# Patient Record
Sex: Female | Born: 1950 | Race: Black or African American | Hispanic: No | Marital: Married | Smoking: Former smoker
Health system: Southern US, Community
[De-identification: ages and names within clinical notes are randomized; demographics above are authoritative.]

## PROBLEM LIST (undated history)

## (undated) DIAGNOSIS — E785 Hyperlipidemia, unspecified: Secondary | ICD-10-CM

## (undated) DIAGNOSIS — I2510 Atherosclerotic heart disease of native coronary artery without angina pectoris: Secondary | ICD-10-CM

## (undated) DIAGNOSIS — K219 Gastro-esophageal reflux disease without esophagitis: Secondary | ICD-10-CM

## (undated) DIAGNOSIS — J189 Pneumonia, unspecified organism: Secondary | ICD-10-CM

## (undated) DIAGNOSIS — Z95810 Presence of automatic (implantable) cardiac defibrillator: Secondary | ICD-10-CM

## (undated) DIAGNOSIS — I214 Non-ST elevation (NSTEMI) myocardial infarction: Secondary | ICD-10-CM

## (undated) DIAGNOSIS — N289 Disorder of kidney and ureter, unspecified: Secondary | ICD-10-CM

## (undated) DIAGNOSIS — G8929 Other chronic pain: Secondary | ICD-10-CM

## (undated) DIAGNOSIS — G43909 Migraine, unspecified, not intractable, without status migrainosus: Secondary | ICD-10-CM

## (undated) DIAGNOSIS — I509 Heart failure, unspecified: Secondary | ICD-10-CM

## (undated) DIAGNOSIS — F329 Major depressive disorder, single episode, unspecified: Secondary | ICD-10-CM

## (undated) DIAGNOSIS — F419 Anxiety disorder, unspecified: Secondary | ICD-10-CM

## (undated) DIAGNOSIS — I493 Ventricular premature depolarization: Secondary | ICD-10-CM

## (undated) DIAGNOSIS — M1990 Unspecified osteoarthritis, unspecified site: Secondary | ICD-10-CM

## (undated) DIAGNOSIS — F319 Bipolar disorder, unspecified: Secondary | ICD-10-CM

## (undated) DIAGNOSIS — J45909 Unspecified asthma, uncomplicated: Secondary | ICD-10-CM

## (undated) DIAGNOSIS — D509 Iron deficiency anemia, unspecified: Secondary | ICD-10-CM

## (undated) DIAGNOSIS — I270 Primary pulmonary hypertension: Secondary | ICD-10-CM

## (undated) DIAGNOSIS — E119 Type 2 diabetes mellitus without complications: Secondary | ICD-10-CM

## (undated) DIAGNOSIS — M549 Dorsalgia, unspecified: Secondary | ICD-10-CM

## (undated) DIAGNOSIS — F32A Depression, unspecified: Secondary | ICD-10-CM

## (undated) DIAGNOSIS — I10 Essential (primary) hypertension: Secondary | ICD-10-CM

## (undated) DIAGNOSIS — I272 Pulmonary hypertension, unspecified: Secondary | ICD-10-CM

## (undated) DIAGNOSIS — I5022 Chronic systolic (congestive) heart failure: Secondary | ICD-10-CM

## (undated) DIAGNOSIS — I255 Ischemic cardiomyopathy: Secondary | ICD-10-CM

## (undated) DIAGNOSIS — R011 Cardiac murmur, unspecified: Secondary | ICD-10-CM

## (undated) DIAGNOSIS — N183 Chronic kidney disease, stage 3 unspecified: Secondary | ICD-10-CM

## (undated) DIAGNOSIS — I959 Hypotension, unspecified: Secondary | ICD-10-CM

## (undated) DIAGNOSIS — D696 Thrombocytopenia, unspecified: Secondary | ICD-10-CM

## (undated) DIAGNOSIS — I639 Cerebral infarction, unspecified: Secondary | ICD-10-CM

## (undated) DIAGNOSIS — E669 Obesity, unspecified: Secondary | ICD-10-CM

## (undated) DIAGNOSIS — I209 Angina pectoris, unspecified: Secondary | ICD-10-CM

## (undated) HISTORY — PX: MASS EXCISION: SHX2000

## (undated) HISTORY — DX: Migraine, unspecified, not intractable, without status migrainosus: G43.909

## (undated) HISTORY — DX: Atherosclerotic heart disease of native coronary artery without angina pectoris: I25.10

## (undated) HISTORY — DX: Obesity, unspecified: E66.9

## (undated) HISTORY — DX: Unspecified osteoarthritis, unspecified site: M19.90

## (undated) HISTORY — DX: Major depressive disorder, single episode, unspecified: F32.9

## (undated) HISTORY — DX: Pulmonary hypertension, unspecified: I27.20

## (undated) HISTORY — DX: Chronic systolic (congestive) heart failure: I50.22

## (undated) HISTORY — PX: IMPLANTABLE CARDIOVERTER DEFIBRILLATOR IMPLANT: SHX5860

## (undated) HISTORY — DX: Cerebral infarction, unspecified: I63.9

## (undated) HISTORY — DX: Hyperlipidemia, unspecified: E78.5

## (undated) HISTORY — DX: Ventricular premature depolarization: I49.3

## (undated) HISTORY — DX: Gastro-esophageal reflux disease without esophagitis: K21.9

## (undated) HISTORY — DX: Thrombocytopenia, unspecified: D69.6

## (undated) HISTORY — PX: TONSILLECTOMY: SUR1361

## (undated) HISTORY — DX: Depression, unspecified: F32.A

## (undated) HISTORY — DX: Essential (primary) hypertension: I10

---
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
## 1979-09-01 HISTORY — PX: RIGHT OOPHORECTOMY: SHX2359

## 1992-01-01 HISTORY — PX: OSTECTOMY METATARSAL: SUR970

## 1999-08-29 ENCOUNTER — Encounter: Admission: RE | Admit: 1999-08-29 | Discharge: 1999-11-27 | Payer: Self-pay | Admitting: Internal Medicine

## 1999-09-01 HISTORY — PX: CORONARY ANGIOPLASTY WITH STENT PLACEMENT: SHX49

## 2000-01-31 ENCOUNTER — Encounter: Admission: RE | Admit: 2000-01-31 | Discharge: 2000-04-30 | Payer: Self-pay | Admitting: Internal Medicine

## 2000-05-09 ENCOUNTER — Ambulatory Visit (HOSPITAL_COMMUNITY): Admission: RE | Admit: 2000-05-09 | Discharge: 2000-05-09 | Payer: Self-pay | Admitting: *Deleted

## 2001-02-28 ENCOUNTER — Emergency Department (HOSPITAL_COMMUNITY): Admission: EM | Admit: 2001-02-28 | Discharge: 2001-02-28 | Payer: Self-pay | Admitting: Emergency Medicine

## 2001-03-20 ENCOUNTER — Ambulatory Visit (HOSPITAL_COMMUNITY): Admission: RE | Admit: 2001-03-20 | Discharge: 2001-03-20 | Payer: Self-pay | Admitting: Family Medicine

## 2001-03-20 ENCOUNTER — Encounter: Payer: Self-pay | Admitting: Family Medicine

## 2001-05-12 ENCOUNTER — Emergency Department (HOSPITAL_COMMUNITY): Admission: EM | Admit: 2001-05-12 | Discharge: 2001-05-13 | Payer: Self-pay | Admitting: Emergency Medicine

## 2001-09-04 ENCOUNTER — Encounter: Payer: Self-pay | Admitting: Internal Medicine

## 2001-09-05 ENCOUNTER — Encounter: Payer: Self-pay | Admitting: Family Medicine

## 2001-09-05 ENCOUNTER — Observation Stay (HOSPITAL_COMMUNITY): Admission: EM | Admit: 2001-09-05 | Discharge: 2001-09-06 | Payer: Self-pay | Admitting: *Deleted

## 2002-03-12 ENCOUNTER — Emergency Department (HOSPITAL_COMMUNITY): Admission: EM | Admit: 2002-03-12 | Discharge: 2002-03-12 | Payer: Self-pay | Admitting: *Deleted

## 2002-06-22 ENCOUNTER — Ambulatory Visit (HOSPITAL_COMMUNITY): Admission: RE | Admit: 2002-06-22 | Discharge: 2002-06-22 | Payer: Self-pay | Admitting: Family Medicine

## 2002-06-22 ENCOUNTER — Encounter: Payer: Self-pay | Admitting: Family Medicine

## 2002-09-28 ENCOUNTER — Encounter: Admission: RE | Admit: 2002-09-28 | Discharge: 2002-09-28 | Payer: Self-pay | Admitting: Family Medicine

## 2002-10-05 ENCOUNTER — Encounter: Admission: RE | Admit: 2002-10-05 | Discharge: 2002-10-05 | Payer: Self-pay | Admitting: Family Medicine

## 2002-10-05 ENCOUNTER — Encounter: Payer: Self-pay | Admitting: Family Medicine

## 2002-10-07 ENCOUNTER — Ambulatory Visit (HOSPITAL_COMMUNITY): Admission: RE | Admit: 2002-10-07 | Discharge: 2002-10-07 | Payer: Self-pay | Admitting: Family Medicine

## 2002-11-25 ENCOUNTER — Encounter: Payer: Self-pay | Admitting: Family Medicine

## 2002-11-25 ENCOUNTER — Ambulatory Visit (HOSPITAL_COMMUNITY): Admission: RE | Admit: 2002-11-25 | Discharge: 2002-11-25 | Payer: Self-pay | Admitting: Family Medicine

## 2002-12-07 ENCOUNTER — Ambulatory Visit (HOSPITAL_COMMUNITY): Admission: RE | Admit: 2002-12-07 | Discharge: 2002-12-07 | Payer: Self-pay | Admitting: *Deleted

## 2002-12-07 ENCOUNTER — Encounter: Payer: Self-pay | Admitting: *Deleted

## 2002-12-18 ENCOUNTER — Ambulatory Visit (HOSPITAL_COMMUNITY): Admission: RE | Admit: 2002-12-18 | Discharge: 2002-12-18 | Payer: Self-pay | Admitting: Gastroenterology

## 2003-07-07 ENCOUNTER — Ambulatory Visit (HOSPITAL_COMMUNITY): Admission: RE | Admit: 2003-07-07 | Discharge: 2003-07-07 | Payer: Self-pay | Admitting: Family Medicine

## 2003-07-07 ENCOUNTER — Encounter: Payer: Self-pay | Admitting: Family Medicine

## 2004-01-03 ENCOUNTER — Other Ambulatory Visit: Admission: RE | Admit: 2004-01-03 | Discharge: 2004-01-03 | Payer: Self-pay | Admitting: *Deleted

## 2004-07-11 ENCOUNTER — Emergency Department (HOSPITAL_COMMUNITY): Admission: EM | Admit: 2004-07-11 | Discharge: 2004-07-11 | Payer: Self-pay | Admitting: Emergency Medicine

## 2005-02-16 ENCOUNTER — Encounter: Admission: RE | Admit: 2005-02-16 | Discharge: 2005-02-16 | Payer: Self-pay | Admitting: Family Medicine

## 2006-12-11 ENCOUNTER — Ambulatory Visit: Payer: Self-pay | Admitting: Internal Medicine

## 2007-01-01 ENCOUNTER — Ambulatory Visit: Payer: Self-pay | Admitting: Internal Medicine

## 2007-01-20 ENCOUNTER — Ambulatory Visit (HOSPITAL_COMMUNITY): Admission: RE | Admit: 2007-01-20 | Discharge: 2007-01-20 | Payer: Self-pay | Admitting: Internal Medicine

## 2007-01-21 ENCOUNTER — Ambulatory Visit: Payer: Self-pay | Admitting: Internal Medicine

## 2007-09-15 ENCOUNTER — Inpatient Hospital Stay (HOSPITAL_BASED_OUTPATIENT_CLINIC_OR_DEPARTMENT_OTHER): Admission: RE | Admit: 2007-09-15 | Discharge: 2007-09-15 | Payer: Self-pay | Admitting: Cardiology

## 2007-10-08 ENCOUNTER — Ambulatory Visit: Payer: Self-pay | Admitting: Internal Medicine

## 2007-10-14 ENCOUNTER — Ambulatory Visit: Payer: Self-pay | Admitting: Internal Medicine

## 2007-10-14 LAB — CONVERTED CEMR LAB
BUN: 19 mg/dL (ref 6–23)
Basophils Absolute: 0 10*3/uL (ref 0.0–0.1)
Basophils Relative: 0.1 % (ref 0.0–1.0)
CO2: 31 meq/L (ref 19–32)
Calcium: 9.7 mg/dL (ref 8.4–10.5)
Chloride: 103 meq/L (ref 96–112)
Creatinine, Ser: 0.8 mg/dL (ref 0.4–1.2)
Eosinophils Absolute: 0.2 10*3/uL (ref 0.0–0.6)
Eosinophils Relative: 2.9 % (ref 0.0–5.0)
GFR calc Af Amer: 95 mL/min
GFR calc non Af Amer: 79 mL/min
Glucose, Bld: 194 mg/dL — ABNORMAL HIGH (ref 70–99)
HCT: 34.5 % — ABNORMAL LOW (ref 36.0–46.0)
Hemoglobin: 11.9 g/dL — ABNORMAL LOW (ref 12.0–15.0)
INR: 1 (ref 0.8–1.0)
Lymphocytes Relative: 40.4 % (ref 12.0–46.0)
MCHC: 34.6 g/dL (ref 30.0–36.0)
MCV: 73.7 fL — ABNORMAL LOW (ref 78.0–100.0)
Monocytes Absolute: 0.6 10*3/uL (ref 0.2–0.7)
Monocytes Relative: 8.7 % (ref 3.0–11.0)
Neutro Abs: 3.1 10*3/uL (ref 1.4–7.7)
Neutrophils Relative %: 47.9 % (ref 43.0–77.0)
Platelets: 198 10*3/uL (ref 150–400)
Potassium: 3.6 meq/L (ref 3.5–5.1)
Prothrombin Time: 12.1 s (ref 10.9–13.3)
RBC: 4.68 M/uL (ref 3.87–5.11)
RDW: 15.7 % — ABNORMAL HIGH (ref 11.5–14.6)
Sodium: 142 meq/L (ref 135–145)
WBC: 6.6 10*3/uL (ref 4.5–10.5)
aPTT: 28.9 s (ref 21.7–29.8)

## 2007-11-03 ENCOUNTER — Ambulatory Visit: Payer: Self-pay | Admitting: Internal Medicine

## 2007-11-03 ENCOUNTER — Ambulatory Visit (HOSPITAL_COMMUNITY): Admission: RE | Admit: 2007-11-03 | Discharge: 2007-11-04 | Payer: Self-pay | Admitting: Internal Medicine

## 2007-11-24 ENCOUNTER — Ambulatory Visit: Payer: Self-pay

## 2008-12-26 ENCOUNTER — Emergency Department (HOSPITAL_COMMUNITY): Admission: EM | Admit: 2008-12-26 | Discharge: 2008-12-26 | Payer: Self-pay | Admitting: Emergency Medicine

## 2009-02-10 ENCOUNTER — Encounter: Admission: RE | Admit: 2009-02-10 | Discharge: 2009-02-10 | Payer: Self-pay | Admitting: Family Medicine

## 2009-03-11 ENCOUNTER — Encounter: Admission: RE | Admit: 2009-03-11 | Discharge: 2009-03-11 | Payer: Self-pay | Admitting: Family Medicine

## 2009-03-21 ENCOUNTER — Encounter: Admission: RE | Admit: 2009-03-21 | Discharge: 2009-03-21 | Payer: Self-pay | Admitting: Family Medicine

## 2009-03-31 ENCOUNTER — Encounter: Admission: RE | Admit: 2009-03-31 | Discharge: 2009-03-31 | Payer: Self-pay | Admitting: Family Medicine

## 2009-04-18 ENCOUNTER — Encounter: Admission: RE | Admit: 2009-04-18 | Discharge: 2009-04-18 | Payer: Self-pay | Admitting: Neurological Surgery

## 2009-06-22 ENCOUNTER — Ambulatory Visit (HOSPITAL_COMMUNITY): Admission: RE | Admit: 2009-06-22 | Discharge: 2009-06-23 | Payer: Self-pay | Admitting: Neurological Surgery

## 2009-08-02 ENCOUNTER — Encounter: Admission: RE | Admit: 2009-08-02 | Discharge: 2009-08-02 | Payer: Self-pay | Admitting: Neurological Surgery

## 2009-10-03 ENCOUNTER — Encounter: Admission: RE | Admit: 2009-10-03 | Discharge: 2009-10-03 | Payer: Self-pay | Admitting: Neurological Surgery

## 2009-12-27 ENCOUNTER — Encounter: Admission: RE | Admit: 2009-12-27 | Discharge: 2009-12-27 | Payer: Self-pay | Admitting: Neurological Surgery

## 2009-12-31 HISTORY — PX: US ECHOCARDIOGRAPHY: HXRAD669

## 2010-02-28 DIAGNOSIS — I639 Cerebral infarction, unspecified: Secondary | ICD-10-CM

## 2010-02-28 DIAGNOSIS — Z8673 Personal history of transient ischemic attack (TIA), and cerebral infarction without residual deficits: Secondary | ICD-10-CM | POA: Insufficient documentation

## 2010-02-28 HISTORY — DX: Cerebral infarction, unspecified: I63.9

## 2010-03-03 ENCOUNTER — Ambulatory Visit: Payer: Self-pay | Admitting: Internal Medicine

## 2010-03-03 ENCOUNTER — Inpatient Hospital Stay (HOSPITAL_COMMUNITY): Admission: EM | Admit: 2010-03-03 | Discharge: 2010-03-09 | Payer: Self-pay | Admitting: Emergency Medicine

## 2010-03-06 ENCOUNTER — Encounter (INDEPENDENT_AMBULATORY_CARE_PROVIDER_SITE_OTHER): Payer: Self-pay | Admitting: Neurology

## 2010-03-08 ENCOUNTER — Ambulatory Visit: Payer: Self-pay | Admitting: Internal Medicine

## 2010-03-08 ENCOUNTER — Encounter (INDEPENDENT_AMBULATORY_CARE_PROVIDER_SITE_OTHER): Payer: Self-pay | Admitting: Neurology

## 2010-06-15 ENCOUNTER — Encounter: Admission: RE | Admit: 2010-06-15 | Discharge: 2010-06-15 | Payer: Self-pay | Admitting: *Deleted

## 2010-07-13 ENCOUNTER — Observation Stay (HOSPITAL_COMMUNITY): Admission: EM | Admit: 2010-07-13 | Discharge: 2010-07-15 | Payer: Self-pay | Admitting: Emergency Medicine

## 2010-08-14 ENCOUNTER — Ambulatory Visit: Payer: Self-pay | Admitting: Cardiology

## 2010-11-11 ENCOUNTER — Encounter: Payer: Self-pay | Admitting: Internal Medicine

## 2010-11-27 ENCOUNTER — Ambulatory Visit: Payer: Self-pay | Admitting: Internal Medicine

## 2010-12-13 ENCOUNTER — Inpatient Hospital Stay (HOSPITAL_COMMUNITY)
Admission: EM | Admit: 2010-12-13 | Discharge: 2010-12-16 | Payer: Self-pay | Source: Home / Self Care | Attending: Internal Medicine | Admitting: Internal Medicine

## 2011-01-15 ENCOUNTER — Encounter: Payer: Self-pay | Admitting: Cardiology

## 2011-01-30 ENCOUNTER — Ambulatory Visit: Payer: Self-pay | Admitting: Cardiology

## 2011-01-30 NOTE — Miscellaneous (Signed)
Summary: Device preload  Clinical Lists Changes  Observations: Added new observation of ICD INDICATN: CM (11/11/2010 9:21) Added new observation of ICDLEADSTAT1: active (11/11/2010 9:21) Added new observation of ICDLEADSER1: [id number] V (11/11/2010 9:21) Added new observation of ICDLEADMOD1: 6947  (11/11/2010 9:21) Added new observation of ICDLEADDOI1: 11/03/2007  (11/11/2010 9:21) Added new observation of ICDLEADLOC1: RV  (11/11/2010 9:21) Added new observation of ICD IMP MD: Andrea Bunting, MD  (11/11/2010 9:21) Added new observation of ICD IMPL DTE: 11/03/2007  (11/11/2010 9:21) Added new observation of ICD SERL#: [id number] H  (11/11/2010 9:21) Added new observation of ICD MODL#: 7232  (11/11/2010 9:21) Added new observation of ICDMANUFACTR: Medtronic  (11/11/2010 9:21) Added new observation of IDC REFER MD: Vonna Drafts  (11/11/2010 9:21) Added new observation of ICD MD: Andrea Bunting, MD  (11/11/2010 9:21)       ICD Specifications Following MD:  Andrea Bunting, MD     Referring MD:  Vonna Drafts ICD Vendor:  Medtronic     ICD Model Number:  7232     ICD Serial Number:  [id number] ICD DOI:  11/03/2007     ICD Implanting MD:  Andrea Bunting, MD  Lead 1:    Location: RV     DOI: 11/03/2007     Model #: 3557     Serial #: [id number]     Status: active  Indications::  CM

## 2011-01-30 NOTE — Procedures (Signed)
Summary: icd check/medtronic   Current Medications (verified): 1)  Pravastatin Sodium 40 Mg Tabs (Pravastatin Sodium) .... Take 1 Tablet By Mouth Once Daily 2)  Uloric 80 Mg Tabs (Febuxostat) .... Take As Directed Every Day 3)  Benicar 20 Mg Tabs (Olmesartan Medoxomil) .... Take 1 Tablet By Mouth Every Morning 4)  Januvia 100 Mg Tabs (Sitagliptin Phosphate) .... Take 1 Tablet By Mouth Every Morning 5)  Carvedilol 25 Mg Tabs (Carvedilol) .... Take 1 Tablet By Mouth Two Times A Day With Food 6)  Tramadol Hcl 50 Mg Tabs (Tramadol Hcl) .... Take 1-2 Tablets By Mouth Every 6 Hours As Needed For Pain 7)  Furosemide 40 Mg Tabs (Furosemide) .... Take 1 Tablet By Mouth Three Times A Day As Needed 8)  Dexilant 60 Mg Cpdr (Dexlansoprazole) .... Take 1 Capsule By Mouth Every Morning 9)  Cyclobenzaprine Hcl 10 Mg Tabs (Cyclobenzaprine Hcl) .... Take 1/2-1 Tablet By Mouth Every 8 Hours As Needed For Spasms 10)  Cymbalta 60 Mg Cpep (Duloxetine Hcl) .... Take 1 Capsule By Mouth Two Times A Day 11)  Tylenol Arthritis Pain 650 Mg Cr-Tabs (Acetaminophen) .... Take As Needed  Allergies (verified): 1)  ! Darvon 2)  ! Asa   ICD Specifications Following MD:  Andrea Bunting, MD     Referring MD:  Andrea [name] ICD Vendor:  Medtronic     ICD Model Number:  [phone]     ICD Serial Number:  [id number] ICD DOI:  11/03/2007     ICD Implanting MD:  Andrea Bunting, MD  Lead 1:    Location: RV     DOI: 11/03/2007     Model #: 7425     Serial #: [id number]     Status: active  Indications::  CM   ICD Follow Up Battery Voltage:  3.11 V     Charge Time:  8.43 seconds     Underlying rhythm:  SR   ICD Device Measurements Right Ventricle:  Amplitude: 15.6 mV, Impedance: 512 ohms, Threshold: 1.0 V at 0.2 msec Shock Impedance: 42/57 ohms   Episodes MS Episodes:  0     Shock:  0     ATP:  0     Nonsustained:  0     Atrial Therapies:  0 Ventricular Pacing:  0%  Brady Parameters Mode VVI     Lower Rate Limit:  40        Tachy Zones VF:  200-250     VT:  200-250     VT1:  182-200     Next Cardiology Appt Due:  03/01/2011 Tech Comments:  [facility] PT---NORMAL DEVICE FUNCTION.  NO EPISODES SINCE LAST CHECK.  NO CHANGES MADE. ROV IN 3 MTHS W/GT. PT NOT INTERESTED IN CARELINK--DOESNT HAVE LANDLINE PHONE. Andrea [name]  November 27, 2010 11:34 AM

## 2011-02-14 ENCOUNTER — Encounter (INDEPENDENT_AMBULATORY_CARE_PROVIDER_SITE_OTHER): Payer: BC Managed Care – PPO | Admitting: Internal Medicine

## 2011-02-14 ENCOUNTER — Encounter: Payer: Self-pay | Admitting: Internal Medicine

## 2011-02-14 DIAGNOSIS — I5022 Chronic systolic (congestive) heart failure: Secondary | ICD-10-CM

## 2011-02-14 DIAGNOSIS — I2589 Other forms of chronic ischemic heart disease: Secondary | ICD-10-CM

## 2011-02-14 DIAGNOSIS — I259 Chronic ischemic heart disease, unspecified: Secondary | ICD-10-CM | POA: Insufficient documentation

## 2011-02-14 DIAGNOSIS — Z95810 Presence of automatic (implantable) cardiac defibrillator: Secondary | ICD-10-CM | POA: Insufficient documentation

## 2011-02-21 NOTE — Assessment & Plan Note (Signed)
Summary: DEVICE/SAF RS FROM BUMPLIST/MB=MJ   Primary Provider:  Dr Fulton Mole   History of Present Illness: Andrea [name] returns today for followup. She is a pleasant 60 yo woman with a h/o DCM/ICM, CHF, DM, HTN who is s/p ICD implant several years ago. In the interim she has undergone stenting of the RCA. She denies c/p. She occaisionally gets sob and PND. She has not had any intercurrent ICD therapies. No peripheral edema. She denies dietary indiscretion.  Current Medications (verified): 1)  Pravastatin Sodium 40 Mg Tabs (Pravastatin Sodium) .... Take 1 Tablet By Mouth Once Daily 2)  Uloric 80 Mg Tabs (Febuxostat) .... Take As Directed Every Day 3)  Benicar 20 Mg Tabs (Olmesartan Medoxomil) .... Take 1 Tablet By Mouth Every Morning 4)  Januvia 100 Mg Tabs (Sitagliptin Phosphate) .... Take 1 Tablet By Mouth Every Morning 5)  Carvedilol 25 Mg Tabs (Carvedilol) .... Take 1 Tablet By Mouth Two Times A Day With Food 6)  Furosemide 40 Mg Tabs (Furosemide) .... Take 1 Tablet By Mouth 2-3 Times Daily 7)  Dexilant 60 Mg Cpdr (Dexlansoprazole) .... Take 1 Capsule By Mouth Every Morning 8)  Cymbalta 60 Mg Cpep (Duloxetine Hcl) .... Take 1 Capsule By Mouth Once Daily 9)  Tylenol Arthritis Pain 650 Mg Cr-Tabs (Acetaminophen) .... Take As Needed 10)  Lantus 100 Unit/ml Soln (Insulin Glargine) .... Once Daily 11)  Effient 10 Mg Tabs (Prasugrel Hcl) .... Take One Tablet By Mouth Daily 12)  Nitrostat 0.4 Mg Subl (Nitroglycerin) .Marland Kitchen.. 1 Tablet Under Tongue At Onset of Chest Pain; You May Repeat Every 5 Minutes For Up To 3 Doses.  Allergies (verified): 1)  ! Darvon 2)  ! Asa  Past History:  Past Medical History: CAD Class 2-3 CHF HTN DM  Past Surgical History: s/p ICD s/p PCI stenting RCA  Review of Systems       The patient complains of dyspnea on exertion.  The patient denies chest pain, syncope, and peripheral edema.         All systems reviewed and negative except as noted in the  HPI.  Vital Signs:  Patient profile:   60 year old female Height:      61 inches Weight:      200 pounds BMI:     37.93 Pulse rate:   111 / minute BP sitting:   108 / 74  (left arm) Cuff size:   large  Vitals Entered By: Hardin Negus, RMA (February 14, 2011 1:57 PM)  Physical Exam  General:  Well developed, well nourished, in no acute distress.  HEENT: normal Neck: supple. No JVD. Carotids 2+ bilaterally no bruits Cor: RRR no rubs, gallops or murmur except for a soft S4 gallop. Lungs: CTA. Well healed ICD incision. Ab: soft, nontender. nondistended. No HSM. Good bowel sounds Ext: warm. no cyanosis, clubbing or edema Neuro: alert and oriented. Grossly nonfocal. affect pleasant     ICD Specifications Following MD:  Lewayne Bunting, MD     Referring MD:  Vonna Drafts ICD Vendor:  Medtronic     ICD Model Number:  7232     ICD Serial Number:  [id number] ICD DOI:  11/03/2007     ICD Implanting MD:  Lewayne Bunting, MD  Lead 1:    Location: RV     DOI: 11/03/2007     Model #: 4098     Serial #: [id number]     Status: active  Indications::  CM   Brady Parameters Mode VVI  Lower Rate Limit:  40      Tachy Zones VF:  200-250     VT:  200-250     VT1:  182-200     MD Comments:  Normal Device function.  Impression & Recommendations:  Problem # 1:  AUTOMATIC IMPLANTABLE CARDIAC DEFIBRILLATOR SITU (ICD-V45.02) Her device is working normally. Will recheck in several months.  Problem # 2:  ISCHEMIC HEART DISEASE (ICD-414.9) She denies anginal symptoms since her stenting. Will follow. Her updated medication list for this problem includes:    Carvedilol 25 Mg Tabs (Carvedilol) .Marland Kitchen... Take 1 tablet by mouth two times a day with food    Effient 10 Mg Tabs (Prasugrel hcl) .Marland Kitchen... Take one tablet by mouth daily    Nitrostat 0.4 Mg Subl (Nitroglycerin) .Marland Kitchen... 1 tablet under tongue at onset of chest pain; you may repeat every 5 minutes for up to 3 doses.  Problem # 3:  CHRONIC SYSTOLIC  HEART FAILURE (ICD-428.22) She remains class 2. Will continue meds as below. Her updated medication list for this problem includes:    Benicar 20 Mg Tabs (Olmesartan medoxomil) .Marland Kitchen... Take 1 tablet by mouth every morning    Carvedilol 25 Mg Tabs (Carvedilol) .Marland Kitchen... Take 1 tablet by mouth two times a day with food    Furosemide 40 Mg Tabs (Furosemide) .Marland Kitchen... Take 1 tablet by mouth 2-3 times daily    Effient 10 Mg Tabs (Prasugrel hcl) .Marland Kitchen... Take one tablet by mouth daily    Nitrostat 0.4 Mg Subl (Nitroglycerin) .Marland Kitchen... 1 tablet under tongue at onset of chest pain; you may repeat every 5 minutes for up to 3 doses.  Patient Instructions: 1)  Your physician wants you to follow-up in:  3 months with device clinic You will receive a reminder letter in the mail two months in advance. If you don't receive a letter, please call our office to schedule the follow-up appointment. 2)  Your physician recommends that you continue on your current medications as directed. Please refer to the Current Medication list given to you today.

## 2011-02-26 ENCOUNTER — Ambulatory Visit
Admission: RE | Admit: 2011-02-26 | Discharge: 2011-02-26 | Disposition: A | Discharge status: Home / Self Care | Payer: BC Managed Care – PPO | Source: Ambulatory Visit | Attending: Neurological Surgery | Admitting: Neurological Surgery

## 2011-02-26 ENCOUNTER — Other Ambulatory Visit: Payer: Self-pay | Admitting: Neurological Surgery

## 2011-02-26 DIAGNOSIS — M542 Cervicalgia: Secondary | ICD-10-CM

## 2011-02-27 NOTE — Cardiovascular Report (Signed)
Summary: Office Visit   Office Visit   Imported By: Roderic Ovens 02/21/2011 15:43:57  _____________________________________________________________________  External Attachment:    Type:   Image     Comment:   External Document

## 2011-03-08 ENCOUNTER — Other Ambulatory Visit: Payer: Self-pay | Admitting: Neurological Surgery

## 2011-03-08 DIAGNOSIS — M542 Cervicalgia: Secondary | ICD-10-CM

## 2011-03-12 ENCOUNTER — Ambulatory Visit
Admission: RE | Admit: 2011-03-12 | Discharge: 2011-03-12 | Disposition: A | Discharge status: Home / Self Care | Payer: BC Managed Care – PPO | Source: Ambulatory Visit | Attending: Neurological Surgery | Admitting: Neurological Surgery

## 2011-03-12 DIAGNOSIS — M542 Cervicalgia: Secondary | ICD-10-CM

## 2011-03-12 LAB — LIPID PANEL
Cholesterol: 187 mg/dL (ref 0–200)
HDL: 37 mg/dL — ABNORMAL LOW (ref >39)
LDL Cholesterol: UNDETERMINED mg/dL (ref 0–99)
Total CHOL/HDL Ratio: 5.1 ratio
Triglycerides: 443 mg/dL — ABNORMAL HIGH (ref <150)
VLDL: UNDETERMINED mg/dL (ref 0–40)

## 2011-03-12 LAB — CBC
HCT: 36.3 % (ref 36.0–46.0)
HCT: 37.6 % (ref 36.0–46.0)
Hemoglobin: 12.7 g/dL (ref 12.0–15.0)
MCH: 25.7 pg — ABNORMAL LOW (ref 26.0–34.0)
MCHC: 33.8 g/dL (ref 30.0–36.0)
MCV: 75.4 fL — ABNORMAL LOW (ref 78.0–100.0)
MCV: 76 fL — ABNORMAL LOW (ref 78.0–100.0)
Platelets: 121 10*3/uL — ABNORMAL LOW (ref 150–400)
Platelets: 121 10*3/uL — ABNORMAL LOW (ref 150–400)
Platelets: 144 10*3/uL — ABNORMAL LOW (ref 150–400)
RBC: 4.95 MIL/uL (ref 3.87–5.11)
RDW: 15.8 % — ABNORMAL HIGH (ref 11.5–15.5)
RDW: 15.8 % — ABNORMAL HIGH (ref 11.5–15.5)
RDW: 16.1 % — ABNORMAL HIGH (ref 11.5–15.5)
WBC: 4.2 10*3/uL (ref 4.0–10.5)
WBC: 5.9 10*3/uL (ref 4.0–10.5)
WBC: 6 10*3/uL (ref 4.0–10.5)

## 2011-03-12 LAB — GLUCOSE, CAPILLARY
Glucose-Capillary: 134 mg/dL — ABNORMAL HIGH (ref 70–99)
Glucose-Capillary: 135 mg/dL — ABNORMAL HIGH (ref 70–99)
Glucose-Capillary: 137 mg/dL — ABNORMAL HIGH (ref 70–99)
Glucose-Capillary: 145 mg/dL — ABNORMAL HIGH (ref 70–99)
Glucose-Capillary: 152 mg/dL — ABNORMAL HIGH (ref 70–99)
Glucose-Capillary: 162 mg/dL — ABNORMAL HIGH (ref 70–99)

## 2011-03-12 LAB — MAGNESIUM: Magnesium: 1.8 mg/dL (ref 1.5–2.5)

## 2011-03-12 LAB — BASIC METABOLIC PANEL
BUN: 19 mg/dL (ref 6–23)
CO2: 25 mEq/L (ref 19–32)
Calcium: 9.2 mg/dL (ref 8.4–10.5)
Calcium: 9.3 mg/dL (ref 8.4–10.5)
Chloride: 106 mEq/L (ref 96–112)
Creatinine, Ser: 0.77 mg/dL (ref 0.4–1.2)
Creatinine, Ser: 0.94 mg/dL (ref 0.4–1.2)
GFR calc Af Amer: >60 mL/min (ref >60)
GFR calc non Af Amer: >60 mL/min (ref >60)
Glucose, Bld: 149 mg/dL — ABNORMAL HIGH (ref 70–99)

## 2011-03-12 LAB — COMPREHENSIVE METABOLIC PANEL
Calcium: 9.2 mg/dL (ref 8.4–10.5)
GFR calc Af Amer: >60 mL/min (ref >60)
GFR calc non Af Amer: >60 mL/min (ref >60)
Potassium: 4.3 mEq/L (ref 3.5–5.1)
Sodium: 139 mEq/L (ref 135–145)
Total Bilirubin: 0.5 mg/dL (ref 0.3–1.2)

## 2011-03-12 LAB — BASIC METABOLIC PANEL WITH GFR
BUN: 28 mg/dL — ABNORMAL HIGH (ref 6–23)
Chloride: 105 meq/L (ref 96–112)
GFR calc Af Amer: >60 mL/min (ref >60)
GFR calc non Af Amer: >60 mL/min (ref >60)
Potassium: 4.5 meq/L (ref 3.5–5.1)
Sodium: 135 meq/L (ref 135–145)

## 2011-03-12 LAB — HEMOGLOBIN A1C
Hgb A1c MFr Bld: 7.1 % — ABNORMAL HIGH (ref <5.7)
Mean Plasma Glucose: 157 mg/dL — ABNORMAL HIGH (ref <117)

## 2011-03-12 LAB — CARDIAC PANEL(CRET KIN+CKTOT+MB+TROPI)
CK, MB: 1.4 ng/mL (ref 0.3–4.0)
Relative Index: INVALID (ref 0.0–2.5)
Total CK: 44 U/L (ref 7–177)
Troponin I: 0.07 ng/mL — ABNORMAL HIGH (ref 0.00–0.06)

## 2011-03-12 LAB — DIFFERENTIAL
Basophils Absolute: 0 10*3/uL (ref 0.0–0.1)
Eosinophils Relative: 3 % (ref 0–5)
Lymphocytes Relative: 51 % — ABNORMAL HIGH (ref 12–46)
Lymphs Abs: 2.2 10*3/uL (ref 0.7–4.0)
Neutro Abs: 1.6 10*3/uL — ABNORMAL LOW (ref 1.7–7.7)
Neutrophils Relative %: 38 % — ABNORMAL LOW (ref 43–77)

## 2011-03-13 LAB — DIFFERENTIAL
Basophils Absolute: 0 10*3/uL (ref 0.0–0.1)
Lymphocytes Relative: 36 % (ref 12–46)
Monocytes Absolute: 0.4 10*3/uL (ref 0.1–1.0)
Neutro Abs: 2.5 10*3/uL (ref 1.7–7.7)

## 2011-03-13 LAB — POCT CARDIAC MARKERS
CKMB, poc: <1 ng/mL — ABNORMAL LOW (ref 1.0–8.0)
Myoglobin, poc: 59.3 ng/mL (ref 12–200)
Troponin i, poc: <0.05 ng/mL (ref 0.00–0.09)

## 2011-03-13 LAB — APTT: aPTT: 29 seconds (ref 24–37)

## 2011-03-13 LAB — CBC
HCT: 38.1 % (ref 36.0–46.0)
Platelets: 137 10*3/uL — ABNORMAL LOW (ref 150–400)
RBC: 5 MIL/uL (ref 3.87–5.11)
RDW: 16.1 % — ABNORMAL HIGH (ref 11.5–15.5)
WBC: 4.8 10*3/uL (ref 4.0–10.5)

## 2011-03-13 LAB — D-DIMER, QUANTITATIVE: D-Dimer, Quant: 0.46 ug/mL-FEU (ref 0.00–0.48)

## 2011-03-13 LAB — PROTIME-INR: Prothrombin Time: 13.7 seconds (ref 11.6–15.2)

## 2011-03-13 LAB — BASIC METABOLIC PANEL
BUN: 26 mg/dL — ABNORMAL HIGH (ref 6–23)
GFR calc Af Amer: >60 mL/min (ref >60)
GFR calc non Af Amer: >60 mL/min (ref >60)
Potassium: 4.2 mEq/L (ref 3.5–5.1)

## 2011-03-13 LAB — CARDIAC PANEL(CRET KIN+CKTOT+MB+TROPI)
CK, MB: 1.3 ng/mL (ref 0.3–4.0)
Relative Index: INVALID (ref 0.0–2.5)
Total CK: 46 U/L (ref 7–177)
Troponin I: 0.07 ng/mL — ABNORMAL HIGH (ref 0.00–0.06)

## 2011-03-15 ENCOUNTER — Telehealth: Payer: Self-pay | Admitting: Diagnostic Radiology

## 2011-03-17 LAB — BASIC METABOLIC PANEL
BUN: 17 mg/dL (ref 6–23)
CO2: 27 mEq/L (ref 19–32)
Chloride: 103 mEq/L (ref 96–112)
GFR calc non Af Amer: >60 mL/min (ref >60)
Glucose, Bld: 183 mg/dL — ABNORMAL HIGH (ref 70–99)
Potassium: 4.1 mEq/L (ref 3.5–5.1)
Sodium: 139 mEq/L (ref 135–145)

## 2011-03-17 LAB — GLUCOSE, CAPILLARY
Glucose-Capillary: 121 mg/dL — ABNORMAL HIGH (ref 70–99)
Glucose-Capillary: 63 mg/dL — ABNORMAL LOW (ref 70–99)
Glucose-Capillary: 68 mg/dL — ABNORMAL LOW (ref 70–99)
Glucose-Capillary: 85 mg/dL (ref 70–99)

## 2011-03-17 LAB — CK TOTAL AND CKMB (NOT AT ARMC)
CK, MB: 0.9 ng/mL (ref 0.3–4.0)
Relative Index: INVALID (ref 0.0–2.5)
Total CK: 33 U/L (ref 7–177)
Total CK: 33 U/L (ref 7–177)

## 2011-03-17 LAB — TROPONIN I: Troponin I: 0.03 ng/mL (ref 0.00–0.06)

## 2011-03-17 LAB — BRAIN NATRIURETIC PEPTIDE: Pro B Natriuretic peptide (BNP): 139 pg/mL — ABNORMAL HIGH (ref 0.0–100.0)

## 2011-03-18 LAB — DIFFERENTIAL
Eosinophils Relative: 2 % (ref 0–5)
Lymphocytes Relative: 38 % (ref 12–46)
Lymphs Abs: 2.2 10*3/uL (ref 0.7–4.0)
Monocytes Relative: 10 % (ref 3–12)

## 2011-03-18 LAB — GLUCOSE, CAPILLARY: Glucose-Capillary: 96 mg/dL (ref 70–99)

## 2011-03-18 LAB — BASIC METABOLIC PANEL
BUN: 12 mg/dL (ref 6–23)
CO2: 29 mEq/L (ref 19–32)
Calcium: 9.9 mg/dL (ref 8.4–10.5)
GFR calc non Af Amer: >60 mL/min (ref >60)
Glucose, Bld: 87 mg/dL (ref 70–99)
Sodium: 141 mEq/L (ref 135–145)

## 2011-03-18 LAB — PROTIME-INR
INR: 1.17 (ref 0.00–1.49)
Prothrombin Time: 14.8 seconds (ref 11.6–15.2)

## 2011-03-18 LAB — D-DIMER, QUANTITATIVE: D-Dimer, Quant: 1.37 ug/mL-FEU — ABNORMAL HIGH (ref 0.00–0.48)

## 2011-03-18 LAB — CBC
HCT: 48.1 % — ABNORMAL HIGH (ref 36.0–46.0)
Hemoglobin: 15.2 g/dL — ABNORMAL HIGH (ref 12.0–15.0)
Platelets: 219 10*3/uL (ref 150–400)

## 2011-03-18 LAB — POCT CARDIAC MARKERS: Myoglobin, poc: 74.2 ng/mL (ref 12–200)

## 2011-03-18 LAB — CK TOTAL AND CKMB (NOT AT ARMC)
CK, MB: 0.7 ng/mL (ref 0.3–4.0)
Total CK: 33 U/L (ref 7–177)

## 2011-03-18 LAB — TROPONIN I: Troponin I: 0.02 ng/mL (ref 0.00–0.06)

## 2011-03-18 LAB — APTT: aPTT: 34 seconds (ref 24–37)

## 2011-03-25 ENCOUNTER — Emergency Department (HOSPITAL_COMMUNITY): Payer: BC Managed Care – PPO

## 2011-03-25 ENCOUNTER — Observation Stay (HOSPITAL_COMMUNITY)
Admission: EM | Admit: 2011-03-25 | Discharge: 2011-03-26 | DRG: 543 | Disposition: A | Discharge status: Home / Self Care | Payer: BC Managed Care – PPO | Attending: Cardiology | Admitting: Cardiology

## 2011-03-25 DIAGNOSIS — Z9861 Coronary angioplasty status: Secondary | ICD-10-CM | POA: Insufficient documentation

## 2011-03-25 DIAGNOSIS — Z794 Long term (current) use of insulin: Secondary | ICD-10-CM | POA: Insufficient documentation

## 2011-03-25 DIAGNOSIS — I509 Heart failure, unspecified: Secondary | ICD-10-CM | POA: Insufficient documentation

## 2011-03-25 DIAGNOSIS — I5022 Chronic systolic (congestive) heart failure: Secondary | ICD-10-CM | POA: Insufficient documentation

## 2011-03-25 DIAGNOSIS — Z6838 Body mass index (BMI) 38.0-38.9, adult: Secondary | ICD-10-CM | POA: Insufficient documentation

## 2011-03-25 DIAGNOSIS — I10 Essential (primary) hypertension: Secondary | ICD-10-CM | POA: Insufficient documentation

## 2011-03-25 DIAGNOSIS — I2789 Other specified pulmonary heart diseases: Secondary | ICD-10-CM | POA: Insufficient documentation

## 2011-03-25 DIAGNOSIS — R079 Chest pain, unspecified: Secondary | ICD-10-CM

## 2011-03-25 DIAGNOSIS — IMO0002 Reserved for concepts with insufficient information to code with codable children: Secondary | ICD-10-CM | POA: Insufficient documentation

## 2011-03-25 DIAGNOSIS — E119 Type 2 diabetes mellitus without complications: Secondary | ICD-10-CM | POA: Insufficient documentation

## 2011-03-25 DIAGNOSIS — I2510 Atherosclerotic heart disease of native coronary artery without angina pectoris: Secondary | ICD-10-CM | POA: Insufficient documentation

## 2011-03-25 DIAGNOSIS — E669 Obesity, unspecified: Secondary | ICD-10-CM | POA: Insufficient documentation

## 2011-03-25 DIAGNOSIS — K219 Gastro-esophageal reflux disease without esophagitis: Secondary | ICD-10-CM | POA: Insufficient documentation

## 2011-03-25 DIAGNOSIS — Z7902 Long term (current) use of antithrombotics/antiplatelets: Secondary | ICD-10-CM | POA: Insufficient documentation

## 2011-03-25 DIAGNOSIS — E785 Hyperlipidemia, unspecified: Secondary | ICD-10-CM | POA: Insufficient documentation

## 2011-03-25 DIAGNOSIS — Z79899 Other long term (current) drug therapy: Secondary | ICD-10-CM | POA: Insufficient documentation

## 2011-03-25 DIAGNOSIS — R0789 Other chest pain: Principal | ICD-10-CM | POA: Insufficient documentation

## 2011-03-25 LAB — CARDIAC PANEL(CRET KIN+CKTOT+MB+TROPI)
CK, MB: 1 ng/mL (ref 0.3–4.0)
Relative Index: 0.8 (ref 0.0–2.5)

## 2011-03-25 LAB — BASIC METABOLIC PANEL
Calcium: 9.2 mg/dL (ref 8.4–10.5)
GFR calc non Af Amer: >60 mL/min (ref >60)
Potassium: 3.8 mEq/L (ref 3.5–5.1)
Sodium: 134 mEq/L — ABNORMAL LOW (ref 135–145)

## 2011-03-25 LAB — TROPONIN I: Troponin I: 0.01 ng/mL (ref 0.00–0.06)

## 2011-03-25 LAB — DIFFERENTIAL
Basophils Absolute: 0 10*3/uL (ref 0.0–0.1)
Basophils Relative: 0 % (ref 0–1)
Eosinophils Absolute: 0.1 10*3/uL (ref 0.0–0.7)
Eosinophils Relative: 2 % (ref 0–5)
Neutrophils Relative %: 53 % (ref 43–77)

## 2011-03-25 LAB — CBC
MCHC: 34.5 g/dL (ref 30.0–36.0)
Platelets: 108 10*3/uL — ABNORMAL LOW (ref 150–400)
RDW: 13.8 % (ref 11.5–15.5)
WBC: 5.3 10*3/uL (ref 4.0–10.5)

## 2011-03-25 LAB — CK TOTAL AND CKMB (NOT AT ARMC)
Relative Index: 1.1 (ref 0.0–2.5)
Total CK: 104 U/L (ref 7–177)

## 2011-03-26 LAB — DIFFERENTIAL
Basophils Relative: 1 % (ref 0–1)
Eosinophils Absolute: 0.2 10*3/uL (ref 0.0–0.7)
Eosinophils Relative: 4 % (ref 0–5)
Monocytes Relative: 10 % (ref 3–12)
Neutrophils Relative %: 52 % (ref 43–77)

## 2011-03-26 LAB — COMPREHENSIVE METABOLIC PANEL
ALT: 29 U/L (ref 0–35)
Alkaline Phosphatase: 105 U/L (ref 39–117)
CO2: 29 mEq/L (ref 19–32)
GFR calc non Af Amer: 58 mL/min — ABNORMAL LOW (ref >60)
Glucose, Bld: 150 mg/dL — ABNORMAL HIGH (ref 70–99)
Potassium: 4 mEq/L (ref 3.5–5.1)
Sodium: 136 mEq/L (ref 135–145)

## 2011-03-26 LAB — GLUCOSE, CAPILLARY
Glucose-Capillary: 109 mg/dL — ABNORMAL HIGH (ref 70–99)
Glucose-Capillary: 109 mg/dL — ABNORMAL HIGH (ref 70–99)
Glucose-Capillary: 113 mg/dL — ABNORMAL HIGH (ref 70–99)
Glucose-Capillary: 129 mg/dL — ABNORMAL HIGH (ref 70–99)
Glucose-Capillary: 133 mg/dL — ABNORMAL HIGH (ref 70–99)
Glucose-Capillary: 140 mg/dL — ABNORMAL HIGH (ref 70–99)
Glucose-Capillary: 167 mg/dL — ABNORMAL HIGH (ref 70–99)
Glucose-Capillary: 175 mg/dL — ABNORMAL HIGH (ref 70–99)
Glucose-Capillary: 209 mg/dL — ABNORMAL HIGH (ref 70–99)
Glucose-Capillary: 213 mg/dL — ABNORMAL HIGH (ref 70–99)
Glucose-Capillary: 49 mg/dL — ABNORMAL LOW (ref 70–99)
Glucose-Capillary: 58 mg/dL — ABNORMAL LOW (ref 70–99)
Glucose-Capillary: 59 mg/dL — ABNORMAL LOW (ref 70–99)
Glucose-Capillary: 60 mg/dL — ABNORMAL LOW (ref 70–99)
Glucose-Capillary: 63 mg/dL — ABNORMAL LOW (ref 70–99)
Glucose-Capillary: 63 mg/dL — ABNORMAL LOW (ref 70–99)
Glucose-Capillary: 65 mg/dL — ABNORMAL LOW (ref 70–99)
Glucose-Capillary: 68 mg/dL — ABNORMAL LOW (ref 70–99)
Glucose-Capillary: 70 mg/dL (ref 70–99)
Glucose-Capillary: 71 mg/dL (ref 70–99)
Glucose-Capillary: 99 mg/dL (ref 70–99)
Glucose-Capillary: 152 mg/dL — ABNORMAL HIGH (ref 70–99)

## 2011-03-26 LAB — CBC
HCT: 42.6 % (ref 36.0–46.0)
Hemoglobin: 12.7 g/dL (ref 12.0–15.0)
Hemoglobin: 13.9 g/dL (ref 12.0–15.0)
MCH: 25.2 pg — ABNORMAL LOW (ref 26.0–34.0)
MCHC: 32.7 g/dL (ref 30.0–36.0)
MCV: 73.2 fL — ABNORMAL LOW (ref 78.0–100.0)
Platelets: 196 10*3/uL (ref 150–400)
RBC: 5.04 MIL/uL (ref 3.87–5.11)
RBC: 5.82 MIL/uL — ABNORMAL HIGH (ref 3.87–5.11)
RDW: 18.5 % — ABNORMAL HIGH (ref 11.5–15.5)
WBC: 8.2 10*3/uL (ref 4.0–10.5)

## 2011-03-26 LAB — BASIC METABOLIC PANEL
BUN: 16 mg/dL (ref 6–23)
BUN: 21 mg/dL (ref 6–23)
Calcium: 8.8 mg/dL (ref 8.4–10.5)
Creatinine, Ser: 0.8 mg/dL (ref 0.4–1.2)
Creatinine, Ser: 0.98 mg/dL (ref 0.4–1.2)
GFR calc Af Amer: >60 mL/min (ref >60)
GFR calc non Af Amer: 58 mL/min — ABNORMAL LOW (ref >60)
GFR calc non Af Amer: >60 mL/min (ref >60)
Glucose, Bld: 84 mg/dL (ref 70–99)

## 2011-03-26 LAB — PROTIME-INR
INR: 1.15 (ref 0.00–1.49)
Prothrombin Time: 14.6 seconds (ref 11.6–15.2)

## 2011-03-26 LAB — URINALYSIS, MICROSCOPIC ONLY
Nitrite: NEGATIVE
Specific Gravity, Urine: 1.018 (ref 1.005–1.030)
Urobilinogen, UA: 0.2 mg/dL (ref 0.0–1.0)

## 2011-03-26 LAB — HEMOGLOBIN A1C
Hgb A1c MFr Bld: 10.4 % — ABNORMAL HIGH (ref 4.6–6.1)
Hgb A1c MFr Bld: 9.4 % — ABNORMAL HIGH (ref 4.6–6.1)
Mean Plasma Glucose: 252 mg/dL

## 2011-03-26 LAB — CARDIAC PANEL(CRET KIN+CKTOT+MB+TROPI)
Relative Index: 0.9 (ref 0.0–2.5)
Total CK: 109 U/L (ref 7–177)
Troponin I: 0.01 ng/mL (ref 0.00–0.06)

## 2011-03-26 LAB — CULTURE, BLOOD (ROUTINE X 2)

## 2011-03-26 LAB — LIPID PANEL
HDL: 25 mg/dL — ABNORMAL LOW (ref >39)
Total CHOL/HDL Ratio: 5.4 RATIO
VLDL: 71 mg/dL — ABNORMAL HIGH (ref 0–40)

## 2011-03-26 LAB — HOMOCYSTEINE: Homocysteine: 14.1 umol/L (ref 4.0–15.4)

## 2011-03-26 LAB — HEPARIN LEVEL (UNFRACTIONATED): Heparin Unfractionated: 0.26 IU/mL — ABNORMAL LOW (ref 0.30–0.70)

## 2011-03-27 NOTE — H&P (Signed)
NAMESULEIKA, DONAVAN                ACCOUNT NO.:  [id number]  MEDICAL RECORD NO.:  [id number]           PATIENT TYPE:  I  LOCATION:  3742                         FACILITY:  [facility]  PHYSICIAN:  Madolyn Frieze. Jens Som, MD, FACCDATE OF BIRTH:  08/15/1951  DATE OF ADMISSION:  03/25/2011 DATE OF DISCHARGE:                             HISTORY & PHYSICAL   [name] is a 60 year old female with past medical history of nonischemic cardiomyopathy, coronary artery disease, hypertension, hyperlipidemia, diabetes, and prior CVA, who we are asked to evaluate for chest pain.  The patient's last echocardiogram was performed in March 2011.  It was a transesophageal echocardiogram.  Her ejection fraction was 15%.  There was mild mitral regurgitation.  There was mild left atrial enlargement.  There was moderate tricuspid regurgitation and an ICD wire was noted.  There was a mobile density on the atrial side which was felt to be mobile calcification versus vegetation.  Her last cardiac catheterization was performed in December 2011.  She was found to have a normal left main.  There was a 30% LAD.  The first diagonal had a 70% lesion.  The circumflex had a 30% branch vessel disease at the takeoff of a large obtuse marginal.  The right coronary artery had a 30% lesion proximally and the distal vessel had a 95-99% lesion.  There was a 30% PDA.  The patient subsequently had a PCI of her distal right coronary artery with a drug-eluting stent.  The patient states that for the past 3 days she has had intermittent headache, neck pain, and chest pain.  It is described as "indigestion."  It can increase with exertion, but then the last the rest of the day.  The pain does not radiate. There was mild shortness of breath and nausea this morning, but there has been no diaphoresis.  The pain is not pleuritic, but it can increase with lying flat and improves with sitting up.  Because of her persistent symptoms, she  presented for further evaluation.  She is presently pain free.  MEDICATIONS: 1. Aspirin 325 mg p.o. daily. 2. Effient 10 mg p.o. daily. 3. Benicar 20 mg p.o. daily. 4. Carvedilol 12.5 mg p.o. b.i.d. 5. Cymbalta 60 mg p.o. daily. 6. Lasix 40 mg p.o. b.i.d. 7. Januvia 100 mg p.o. daily. 8. Pravachol 40 mg p.o. daily. 9. Insulin based on CBG readings.  She has intolerance to NSAIDS and CODEINE.  SOCIAL HISTORY:  She does not smoke nor does she consume alcohol.  FAMILY HISTORY:  Positive for coronary artery disease in her mother.  PAST MEDICAL HISTORY:  Significant for: 1. Diabetes. 2. Hypertension. 3. Hyperlipidemia. 4. She has a history of nonischemic cardiomyopathy as well as coronary     artery disease. 5. She has pulmonary hypertension and prior CVA. 6. She also has a history of migraines. 7. She has gastroesophageal reflux disease, gout, depression, and     anxiety. 8. She has had a prior appendectomy, tonsillectomy, bilateral salpingo-     oophorectomy, and a previous foot surgery.  REVIEW OF SYSTEMS:  She denies any headaches, fevers, or chills.  There  is no productive cough or hemoptysis.  There is no dysphagia, odynophagia, melena, or hematochezia.  There is no dysuria or hematuria. There is no rash or seizure activity.  There is no orthopnea, PND, or pedal edema.  Remaining systems are negative.  PHYSICAL EXAMINATION:  VITAL SIGNS:  Today, blood pressure of 130/70. Her pulse is 88. GENERAL:  She is well developed and well nourished, in no acute distress.  She does not appear to be depressed.  There is no peripheral clubbing. SKIN:  Warm and dry. BACK:  Normal. HEENT:  Normal with normal eyelids. NECK:  Supple with normal upstroke bilaterally.  No bruits noted.  There is no jugular venous distention and I cannot appreciate thyromegaly. CHEST:  Clear to auscultation with normal expansion. CARDIOVASCULAR:  Regular rate and rhythm with a normal S1 and S2.   There are no murmurs, rubs, or gallops noted. ABDOMEN:  Nontender and nondistended.  Positive bowel sounds.  No hepatosplenomegaly.  No mass appreciated.  There is no abdominal bruit. She has 2+ femoral pulses bilaterally.  No bruits. EXTREMITIES:  No edema and I can palpate no cords.  She has 2+ dorsalis pedis pulses bilaterally. NEUROLOGIC:  Grossly intact.  LABORATORY DATA:  White blood cell count of 5.3 with a hemoglobin of 13. Her hematocrit is 37.7.  Platelet count is 108.  Her MCV is 74.5. Initial cardiac markers are negative.  Her sodium is 134, potassium 3.8. BUN and creatinine are 20 and 0.81.  Electrocardiogram shows normal sinus rhythm with left ventricular hypertrophy and nonspecific ST changes.  Chest x-ray shows no acute cardiopulmonary process.  DIAGNOSES: 1. Chest pain - the patient's symptoms have both typical and atypical     features.  However, they are predominant atypical as they increase     with lying flat and improved with sitting up.  They can also last     all day.  Her initial markers are negative.  We will plan to admit     and cycle cardiac markers.  If negative, I would favor an     outpatient Myoview.  I will review this with Dr. Swaziland.  In the     meantime, we will continue with her aspirin, Effient and add IV     heparin.  She will continue with the beta-blocker, statin, and ARB. 2. Microcytic anemia - the patient may need a GI evaluation following     discharge. 3. History of nonischemic cardiomyopathy - she will continue on an ARB     and her beta-blocker. 4. Hypertension. 5. Hyperlipidemia - continue statin. 6. Diabetes mellitus.     Madolyn Frieze Jens Som, MD, [facility]     BSC/MEDQ  D:  03/25/2011  T:  03/26/2011  Job:  [id number]  Electronically Signed by Olga Millers MD [facility] on 03/27/2011 05:10:00 PM

## 2011-04-03 ENCOUNTER — Ambulatory Visit (HOSPITAL_COMMUNITY): Payer: BC Managed Care – PPO | Attending: Cardiology | Admitting: Radiology

## 2011-04-03 DIAGNOSIS — R0989 Other specified symptoms and signs involving the circulatory and respiratory systems: Secondary | ICD-10-CM

## 2011-04-03 DIAGNOSIS — I252 Old myocardial infarction: Secondary | ICD-10-CM

## 2011-04-03 DIAGNOSIS — R079 Chest pain, unspecified: Secondary | ICD-10-CM

## 2011-04-03 DIAGNOSIS — I4949 Other premature depolarization: Secondary | ICD-10-CM

## 2011-04-03 MED ORDER — REGADENOSON 0.4 MG/5ML IV SOLN
0.4000 mg | Freq: Once | INTRAVENOUS | Status: AC
  Administered 2011-04-03: 0.4 mg via INTRAVENOUS

## 2011-04-03 MED ORDER — TECHNETIUM TC 99M TETROFOSMIN IV KIT
11.0000 | PACK | Freq: Once | INTRAVENOUS | Status: AC | PRN
  Administered 2011-04-03: 11 via INTRAVENOUS

## 2011-04-03 MED ORDER — TECHNETIUM TC 99M TETROFOSMIN IV KIT
33.0000 | PACK | Freq: Once | INTRAVENOUS | Status: AC | PRN
  Administered 2011-04-03: 33 via INTRAVENOUS

## 2011-04-03 NOTE — Progress Notes (Signed)
[facility] SITE 3 NUCLEAR MED [address] [place] [address] [phone]  Cardiology Nuclear Med Study  Andrea [name] is a 60 y.o. female [id number] November 11, 1951   Nuclear Med Background Indication for Stress Test:  Evaluation for Ischemia,PTCA/ Stent Patency and 03/26/11 Post Hospital: CP, MI ruled out History: '08 AICD (NICM);3/11 Echo: EF=15%;12/11 Myocardial Perfusion Study: Abnormal, old inferior infarct with peri-infarct ischemia EF=23%> Cath>RCA stent, residual mild-moderate CAD Cardiac Risk Factors: CVA, Family History - CAD, History of Smoking, Hypertension, IDDM Type 2, Lipids and TIA  Symptoms:  Chest Pain with Exertion (last date of chest discomfort prior to lexiscan when got off camera; daily x 1 week), DOE, Fatigue, Fatigue with Exertion and SOB   Nuclear Pre-Procedure Caffeine/Decaff Intake:  7:00pm NPO After: 11:30pm   Lungs:  Clear IV 0.9% NS with Angio Cath:  20g  IV Site: R Wrist  IV Started by:  Stanton Kidney, EMT-P  Chest Size (in):  40 Cup Size: C  Height: 4\' 11"  (1.499 m)  Weight:  200 lb (90.719 kg)  BMI:  Body mass index is 40.39 kg/(m^2). Tech Comments:  Carvedilol held this am.    Nuclear Med Study 1 or 2 day study: 1 day  Stress Test Type:  Lexiscan  Reading MD: Cassell Clement, MD  Order Authorizing Provider:  Dr. Peter Swaziland, MD  Resting Radionuclide: Technetium 66m Tetrofosmin  Resting Radionuclide Dose: 11 mCi   Stress Radionuclide:  Technetium 35m Tetrofosmin  Stress Radionuclide Dose: 33 mCi           Stress Protocol Rest HR: 100 Stress HR: 120  Rest BP: 117/79 Stress BP: 131/77  Exercise Time (min): n/a METS: n/a   Predicted Max HR: 161 bpm % Max HR: 74.53 bpm Rate Pressure Product: 74259   Dose of Adenosine (mg):  n/a Dose of Lexiscan: 0.4 mg  Dose of Atropine (mg): n/a Dose of Dobutamine: n/a mcg/kg/min (at max HR)  Stress Test Technologist: Irean Hong, RN  Nuclear Technologist:  Doyne Keel, CNMT      Rest Procedure:  Myocardial perfusion imaging was performed at rest 45 minutes following the intravenous administration of Technetium 30m Tetrofosmin. Rest ECG: NSR with nonspecific ST-T changes, occasional PVC's.  Stress Procedure:  The patient received IV Lexiscan 0.4 mg over 15-seconds.  Technetium 72m Tetrofosmin injected at 30-seconds.  The EKG was nondiagnostic due to baseline changes, occasional PVC's. The patient complained of chest tightness with Lexiscan. Quantitative spect images were obtained after a 45 minute delay. Stress ECG: No significant change from baseline ECG  QPS Raw Data Images:  Normal; no motion artifact; normal heart/lung ratio. Stress Images:  There is decreased uptake in the inferior wall. Rest Images:  There is decreased uptake in the inferior wall. Subtraction (SDS):  There is a fixed defect that is most consistent with a previous infarction with peri-infarct ischemia. Transient Ischemic Dilatation (Normal <1.22):  1.08 Lung/Heart Ratio (Normal <0.45):  0.27  Quantitative Gated Spect Images QGS EDV:  220 ml QGS ESV:  173 ml QGS cine images:  Global hypokinesis QGS EF: 21%  Impression Exercise Capacity:  Lexiscan with no exercise. BP Response:  Normal blood pressure response. Clinical Symptoms:  Mild chest pain/dyspnea. ECG Impression:  No significant ST segment change suggestive of ischemia. 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    Signed by Leotis Pain on 04/03/2011 at 9:24  AM.   Cassell Clement

## 2011-04-04 ENCOUNTER — Telehealth: Payer: Self-pay | Admitting: *Deleted

## 2011-04-04 NOTE — Telephone Encounter (Signed)
Notified of myoview results. Per Dr. Swaziland will need to have repeat cath if continues to have pain.  Per pt has she is still having some pain rate of 4-5. Has not taken any NTG, Wants to wait until tomorrow to decide if pain is any better and if needs to be cathed next week. She will call back tomorrow

## 2011-04-04 NOTE — Progress Notes (Signed)
ROUTED TO DR. JORDAN.Andrea [name] ° ° °

## 2011-04-05 ENCOUNTER — Telehealth: Payer: Self-pay | Admitting: *Deleted

## 2011-04-05 NOTE — Telephone Encounter (Signed)
Notified of myoview. Advised per Dr. Swaziland for her to call us if CP continues. Will need to Cath

## 2011-04-09 ENCOUNTER — Telehealth: Payer: Self-pay | Admitting: *Deleted

## 2011-04-09 ENCOUNTER — Emergency Department (HOSPITAL_COMMUNITY): Payer: BC Managed Care – PPO

## 2011-04-09 ENCOUNTER — Inpatient Hospital Stay (HOSPITAL_COMMUNITY)
Admission: EM | Admit: 2011-04-09 | Discharge: 2011-04-10 | DRG: 550 | Disposition: A | Discharge status: Home / Self Care | Payer: BC Managed Care – PPO | Attending: Cardiovascular Disease | Admitting: Cardiovascular Disease

## 2011-04-09 DIAGNOSIS — I2510 Atherosclerotic heart disease of native coronary artery without angina pectoris: Secondary | ICD-10-CM

## 2011-04-09 DIAGNOSIS — I5022 Chronic systolic (congestive) heart failure: Secondary | ICD-10-CM | POA: Diagnosis present

## 2011-04-09 DIAGNOSIS — I2789 Other specified pulmonary heart diseases: Secondary | ICD-10-CM | POA: Diagnosis present

## 2011-04-09 DIAGNOSIS — I509 Heart failure, unspecified: Secondary | ICD-10-CM | POA: Diagnosis present

## 2011-04-09 DIAGNOSIS — I428 Other cardiomyopathies: Secondary | ICD-10-CM | POA: Diagnosis present

## 2011-04-09 DIAGNOSIS — I10 Essential (primary) hypertension: Secondary | ICD-10-CM | POA: Diagnosis present

## 2011-04-09 DIAGNOSIS — E119 Type 2 diabetes mellitus without complications: Secondary | ICD-10-CM | POA: Diagnosis present

## 2011-04-09 DIAGNOSIS — Z794 Long term (current) use of insulin: Secondary | ICD-10-CM

## 2011-04-09 DIAGNOSIS — Z95810 Presence of automatic (implantable) cardiac defibrillator: Secondary | ICD-10-CM

## 2011-04-09 DIAGNOSIS — Z8673 Personal history of transient ischemic attack (TIA), and cerebral infarction without residual deficits: Secondary | ICD-10-CM

## 2011-04-09 DIAGNOSIS — R0602 Shortness of breath: Secondary | ICD-10-CM

## 2011-04-09 DIAGNOSIS — R079 Chest pain, unspecified: Secondary | ICD-10-CM

## 2011-04-09 DIAGNOSIS — E669 Obesity, unspecified: Secondary | ICD-10-CM | POA: Diagnosis present

## 2011-04-09 DIAGNOSIS — E785 Hyperlipidemia, unspecified: Secondary | ICD-10-CM | POA: Diagnosis present

## 2011-04-09 DIAGNOSIS — Z6838 Body mass index (BMI) 38.0-38.9, adult: Secondary | ICD-10-CM

## 2011-04-09 DIAGNOSIS — I214 Non-ST elevation (NSTEMI) myocardial infarction: Principal | ICD-10-CM | POA: Diagnosis present

## 2011-04-09 LAB — DIFFERENTIAL
Band Neutrophils: 0 % (ref 0–10)
Basophils Absolute: 0 K/uL (ref 0.0–0.1)
Basophils Relative: 0 % (ref 0–1)
Blasts: 0 %
Eosinophils Absolute: 0.1 10*3/uL (ref 0.0–0.7)
Eosinophils Relative: 2 % (ref 0–5)
Lymphocytes Relative: 37 % (ref 12–46)
Lymphocytes Relative: 44 % (ref 12–46)
Lymphs Abs: 2.3 K/uL (ref 0.7–4.0)
Lymphs Abs: 2 10*3/uL (ref 0.7–4.0)
Monocytes Absolute: 0.4 10*3/uL (ref 0.1–1.0)
Monocytes Relative: 7 % (ref 3–12)
Myelocytes: 0 %
Neutro Abs: 3.4 10*3/uL (ref 1.7–7.7)
Neutrophils Relative %: 54 % (ref 43–77)
Promyelocytes Absolute: 0 %

## 2011-04-09 LAB — CBC
HCT: 38.4 % (ref 36.0–46.0)
Hemoglobin: 13.1 g/dL (ref 12.0–15.0)
Hemoglobin: 12.3 g/dL (ref 12.0–15.0)
MCH: 25 pg — ABNORMAL LOW (ref 26.0–34.0)
MCHC: 34.1 g/dL (ref 30.0–36.0)
MCHC: 33.7 g/dL (ref 30.0–36.0)
MCV: 73.4 fL — ABNORMAL LOW (ref 78.0–100.0)
MCV: 75 fL — ABNORMAL LOW (ref 78.0–100.0)
Platelets: 145 10*3/uL — ABNORMAL LOW (ref 150–400)
RBC: 5.23 MIL/uL — ABNORMAL HIGH (ref 3.87–5.11)
RDW: 14.1 % (ref 11.5–15.5)
RDW: 16.1 % — ABNORMAL HIGH (ref 11.5–15.5)
WBC: 6.2 K/uL (ref 4.0–10.5)

## 2011-04-09 LAB — GLUCOSE, CAPILLARY
Glucose-Capillary: 170 mg/dL — ABNORMAL HIGH (ref 70–99)
Glucose-Capillary: 240 mg/dL — ABNORMAL HIGH (ref 70–99)
Glucose-Capillary: 250 mg/dL — ABNORMAL HIGH (ref 70–99)
Glucose-Capillary: 315 mg/dL — ABNORMAL HIGH (ref 70–99)

## 2011-04-09 LAB — BASIC METABOLIC PANEL
BUN: 32 mg/dL — ABNORMAL HIGH (ref 6–23)
CO2: 30 mEq/L (ref 19–32)
Calcium: 9.6 mg/dL (ref 8.4–10.5)
Chloride: 104 mEq/L (ref 96–112)
Creatinine, Ser: 0.96 mg/dL (ref 0.4–1.2)
Creatinine, Ser: 1.37 mg/dL — ABNORMAL HIGH (ref 0.4–1.2)
GFR calc Af Amer: >60 mL/min (ref >60)

## 2011-04-09 LAB — CARDIAC PANEL(CRET KIN+CKTOT+MB+TROPI)
CK, MB: 1.1 ng/mL (ref 0.3–4.0)
Relative Index: 0.4 (ref 0.0–2.5)
Relative Index: INVALID (ref 0.0–2.5)
Total CK: 256 U/L — ABNORMAL HIGH (ref 7–177)
Troponin I: 0.02 ng/mL (ref 0.00–0.06)
Troponin I: 0.61 ng/mL (ref 0.00–0.06)

## 2011-04-09 LAB — CK TOTAL AND CKMB (NOT AT ARMC)
CK, MB: 2.1 ng/mL (ref 0.3–4.0)
Relative Index: INVALID (ref 0.0–2.5)
Total CK: 95 U/L (ref 7–177)

## 2011-04-09 LAB — BRAIN NATRIURETIC PEPTIDE: Pro B Natriuretic peptide (BNP): 192 pg/mL — ABNORMAL HIGH (ref 0.0–100.0)

## 2011-04-09 LAB — BASIC METABOLIC PANEL WITH GFR
BUN: 22 mg/dL (ref 6–23)
CO2: 28 meq/L (ref 19–32)
Chloride: 102 meq/L (ref 96–112)
GFR calc non Af Amer: 60 mL/min — ABNORMAL LOW (ref >60)
Glucose, Bld: 155 mg/dL — ABNORMAL HIGH (ref 70–99)
Potassium: 3.6 meq/L (ref 3.5–5.1)
Sodium: 140 meq/L (ref 135–145)

## 2011-04-09 LAB — PROTIME-INR
INR: 1.02 (ref 0.00–1.49)
INR: 1 (ref 0.00–1.49)
Prothrombin Time: 13.6 seconds (ref 11.6–15.2)
Prothrombin Time: 12.9 seconds (ref 11.6–15.2)

## 2011-04-09 LAB — POCT ACTIVATED CLOTTING TIME
Activated Clotting Time: 376 seconds
Activated Clotting Time: 211 seconds

## 2011-04-09 LAB — TROPONIN I: Troponin I: 0.41 ng/mL — ABNORMAL HIGH (ref 0.00–0.06)

## 2011-04-09 LAB — APTT
aPTT: 29 s (ref 24–37)
aPTT: 21 seconds — ABNORMAL LOW (ref 24–37)

## 2011-04-09 LAB — MRSA PCR SCREENING: MRSA by PCR: NEGATIVE

## 2011-04-09 NOTE — Telephone Encounter (Signed)
Called pt to see if continuing to have CP. States she has had several episodes over week-end. Per Dr. Swaziland will need to be re cathed.  Called Trish and was advised to send pt to ER. Advised pt. To go to ER.

## 2011-04-10 DIAGNOSIS — I214 Non-ST elevation (NSTEMI) myocardial infarction: Secondary | ICD-10-CM

## 2011-04-10 LAB — CBC
HCT: 35.5 % — ABNORMAL LOW (ref 36.0–46.0)
MCH: 25.2 pg — ABNORMAL LOW (ref 26.0–34.0)
MCV: 73.2 fL — ABNORMAL LOW (ref 78.0–100.0)
Platelets: 145 10*3/uL — ABNORMAL LOW (ref 150–400)
RDW: 14.2 % (ref 11.5–15.5)

## 2011-04-10 LAB — CARDIAC PANEL(CRET KIN+CKTOT+MB+TROPI)
CK, MB: 2.9 ng/mL (ref 0.3–4.0)
Troponin I: 0.83 ng/mL (ref 0.00–0.06)

## 2011-04-10 LAB — BASIC METABOLIC PANEL
BUN: 19 mg/dL (ref 6–23)
Creatinine, Ser: 0.94 mg/dL (ref 0.4–1.2)
GFR calc non Af Amer: >60 mL/min (ref >60)
Glucose, Bld: 176 mg/dL — ABNORMAL HIGH (ref 70–99)
Potassium: 4 mEq/L (ref 3.5–5.1)

## 2011-04-10 LAB — GLUCOSE, CAPILLARY
Glucose-Capillary: 116 mg/dL — ABNORMAL HIGH (ref 70–99)
Glucose-Capillary: 132 mg/dL — ABNORMAL HIGH (ref 70–99)
Glucose-Capillary: 145 mg/dL — ABNORMAL HIGH (ref 70–99)

## 2011-04-12 NOTE — Procedures (Signed)
NAMEASMI, FUGERE                ACCOUNT NO.:  [id number]  MEDICAL RECORD NO.:  [id number]           PATIENT TYPE:  I  LOCATION:  2919                         FACILITY:  [facility]  PHYSICIAN:  Arturo Morton. Riley Kill, MD, FACCDATE OF BIRTH:  [birth year]  DATE OF PROCEDURE:  04/09/2011 DATE OF DISCHARGE:                           CARDIAC CATHETERIZATION   INDICATIONS:  [name] is a 60 year old with a history of nonischemic cardiomyopathy, she has a defibrillator.  She has reduced LV function several months ago and Dr. Excell Seltzer placed a stent into the distal right coronary artery with an excellent angiographic result.  She had mild proximal disease.  In the interim, she has had some recurrent chest pain with radionuclide imaging which does not demonstrate high-grade ischemia.  However, she continued to have further symptoms and she was seen by Dr. Excell Seltzer in the emergency room.  He recommended repeat catheterization.  The patient has an aspirin allergy.  Of note, the patient is on Prasugrel.  PROCEDURE: 1. Placement of catheters without left heart catheterization. 2. Selective coronary arteriography. 3. Percutaneous intervention of the proximal right coronary artery     with a 3.5 x 28 PROMUS drug-eluting stent postdilated to 5.  DESCRIPTION OF PROCEDURE:  The patient was brought to the lab, prepped and draped in the usual fashion.  Through an anterior puncture, a 4- French sheath was placed.  Views of the left and right coronary arteries were obtained in multiple angiographic projections.  I brought Dr. Excell Seltzer into the lab.  The distally placed stent looked excellent but there was a high-grade lesion proximally in the right coronary artery. As a result, we recommended a percutaneous intervention of the proximal RCA.  There is other scattered disease, this will be noted in the remainder of the report.  We discussed this with the patient and she consented to proceed.  She had already  received Effient.  The JR-4 guiding catheter with side holes was utilized.  Bivalirudin was given according to protocol.  A cougar wire was placed across the lesion after an appropriate ACT was obtained.  Predilatation was done with an 8 mm length balloon.  Following this, a 28 x 3.5 drug-eluting stent PROMUS Element was placed across the lesion.  There was segmental disease around the bend distal to the high-grade focal area, and so the entire air was felt to be appropriate to cover.  The stent was subsequently deployed at about 13-14 atmospheres.  In order to prevent stent compression, a second wire was placed across the lesion, and the noncompliant postdilatation balloon was taken up into the shepherd crook right and carefully advanced around the corner and postdilatations done throughout the course of the stent.  Postdilatation was done up to about 12-14 atmospheres with a 3.75 [place] trek balloon.  There was marked improvement in the appearance of the artery.  All catheters were subsequently removed.  After final shots, the femoral sheath was sewn into place and she was taken to the holding area in satisfactory clinical condition.  ANGIOGRAPHIC DATA: 1. The left main is free of critical disease. 2. The LAD courses to the  apex.  There is a tiny first diagonal which     provides a septal perforator, after this there is 30% segmental     disease, about 50% ostial diagonal disease.  There is a mild     diffuse luminal irregularity throughout the mid and distal LAD and     the vessel is fairly heavily calcified. 3. The circumflex has about 40-50% narrowing in the first bend     followed by a tandem lesion of about 30-40% at the bifurcation of     the marginals, there is 30% narrowing in the proximal marginal, 30-     50% narrowing in the inferior branch.  This is also compatible with     diffuse disease. 4. The right coronary artery demonstrates an 85-90% stenosis     proximally.  The  vessel then opens up and there is an area of about     50-60% segmental disease just around the first bend.  Adequate     coverage of the proximal lesion would result in geographic miss     involving the second lesion.  Distally, in the acute margin, there     is about 30% narrowing and distal to this is a widely placed stent     from the previous intervention.  The posterior descending and     posterolateral branches have mild luminal irregularities without     critical narrowing.  Following the areas of stenting, this area was     reduced to 0% residual luminal narrowing.  As mentioned,     postdilatation was done to maximal expand of the stent.  The final     angiographic result was excellent.  There appeared to be good     confirmation of the stent to the vessel wall and good stent     expansion.  No evidence of edge tear was noted and no evidence of     horizontal compression was noted.  CONCLUSIONS: 1. Successful percutaneous stenting of the proximal right coronary     artery as noted above. 2. Continued patency of the distal right coronary artery. 3. Scattered coronary artery disease of the circumflex system and LAD     as noted above.  DISPOSITION: 1. The patient now has a history of what sounds like previous TIA and     will be switched from Effient to Brilinta. 2. She has an aspirin allergy. 3. Continued medical therapy will be warranted. 4. Follow up with Dr. Swaziland.     Arturo Morton. Riley Kill, MD, [facility]     TDS/MEDQ  D:  04/09/2011  T:  04/10/2011  Job:  [id number]  cc:   Peter M. Swaziland, M.D. [facility] Elana Alm. Nicholos Johns, M.D. Doylene Canning. Ladona Ridgel, MD Veverly Fells. Excell Seltzer, MD  Electronically Signed by Shawnie Pons MD [facility] on 04/12/2011 05:42:09 AM

## 2011-04-13 ENCOUNTER — Telehealth: Payer: Self-pay | Admitting: Cardiology

## 2011-04-13 DIAGNOSIS — I2510 Atherosclerotic heart disease of native coronary artery without angina pectoris: Secondary | ICD-10-CM

## 2011-04-13 NOTE — Telephone Encounter (Signed)
Would like to speak with RN regarding a possible conflict in her meds.

## 2011-04-16 ENCOUNTER — Telehealth: Payer: Self-pay | Admitting: Cardiology

## 2011-04-16 MED ORDER — CLOPIDOGREL BISULFATE 75 MG PO TABS: 75.0000 mg | ORAL_TABLET | Freq: Every day | ORAL | Status: DC

## 2011-04-16 NOTE — Telephone Encounter (Signed)
Called Friday stating the hospital put her on Brilinta and the contraindications say not to take if has gout and arthritis.  States she has both. Advised to continue med until I could speak w/ Dr. Swaziland on Monday 4/16. Today she called stating she is SOB and heart is racing. Per Dr. Swaziland advised to stop Brilinta and will put back on Plavix. Sent to [facility]

## 2011-04-17 NOTE — Discharge Summary (Signed)
[name], MAIDEN NO.:  [id number]  MEDICAL RECORD NO.:  [id number]           PATIENT TYPE:  I  LOCATION:  3742                         FACILITY:  [facility]  PHYSICIAN:  Peter M. Swaziland, M.D.  DATE OF BIRTH:  December 14, 1951  DATE OF ADMISSION:  03/25/2011 DATE OF DISCHARGE:  03/26/2011                              DISCHARGE SUMMARY   PRIMARY CARDIOLOGIST:  Peter M. Swaziland, MD  ELECTROPHYSIOLOGIST:  Doylene Canning. Ladona Ridgel, MD  PRIMARY CARE PHYSICIAN:  Elana Alm. Nicholos Johns, MD  DISCHARGE DIAGNOSIS:  Noncardiac chest pain (likely gastrointestinal). A.  Atypical features and ruled out with serial negative cardiac enzymes, Lexiscan Myoview scheduled on April 03, 2011, at 9:15 at [facility] for risk stratification.  SECONDARY DIAGNOSES: 1. Coronary artery disease.     a.     Cardiac catheterization on December 16, 2010:  Normal left      main, 30% LAD and  D1 70%, circumflex 30% branch vessel disease at     the takeoff of large OM, RCA with 30% proximally and 95-99% lesion      distally, 30% PDA.     b.     Percutaneous intervention and stenting of the lesion in the      distal right coronary artery with 3.5 x 16 mm Promus Element DES. 2. Chronic systolic congestive heart failure, class II-III secondary     to nonischemic dilated cardiomyopathy, LVEF 15%.     a.     S/P Medtronic ICD.     b.     TEE March 08, 2010:  LVEF 15%, mild MR, mild LAE, moderate      TR, mobile density on atrial side (calcification versus      agitation). 3. Insulin-dependent diabetes mellitus. 4. Hypertension. 5. Hyperlipidemia. 6. Pulmonary hypertension (PA peak pressure 47 mmHg). 7. Obesity, BMI 38.4. 8. History of CVA. 9. GERD. 10.Degenerative disk disease. 11.History of pneumonia. 12.History of inguinal hernia. 13.History of gastritis. 14.Thrombocytopenia.     a.     First noted December 13, 2010, platelets ranging from 108-      144,000 since then.  ALLERGIES AND INTOLERANCES: 1.  ASPIRIN (asthma attack, upset stomach). 2. MEPERIDINE/ DEMEROL (upset stomach). 3. NSAIDS/COX-2 INHIBITORS (upset stomach). 4. OXYCODONEAPAP (GI upset).  PROCEDURES:  EKG March 25, 2011:  NSR with LVH and nonspecific ST changes. A. CXR March 25, 2011:  No acute cardiopulmonary process.  HISTORY OF PRESENT ILLNESS:  [name] is a 60 year old female with the above-noted complex medical history who presented to [facility] ED with complaints of mostly atypical chest discomfort.  The patient was in her usual state of health until 3 days prior to her presentation when she described intermittent headaches, neck pain and chest discomfort.  She describes it as similar to her prior indigestion. She does notice worsening with exertion, but she also notices worsening while in the supine position as well as relief in the sitting position. Her symptoms are persistent lasting for an entire day.  When evaluated by Cardiology in the emergency department she was pain free without objective evidence of an cardiac etiology.  Plan was to admit her and cycle cardiac enzymes, if negative likely outpatient Myoview.  HOSPITAL COURSE:  The patient was admitted and cardiac enzymes were cycled, all three full sets were negative.  She was seen by her primary cardiologist, Dr. Peter Swaziland on morning of March 26, 2011, who deemed her symptoms mostly atypical and agreed with the plan as outlined on admission by attending cardiologist, Dr. Jens Som (rule out, if enzymes negative outpatient Myoview).  To that end a Lexiscan Myoview has been scheduled for April 03, 2011, at 9:15 a.m. at [facility].  No changes were made to the patient's preadmission medications.  At the time of her discharge, she received her old medication list, follow-up instructions, and all questions and concerns were addressed prior to leaving the hospital.  It is no worthy that the patient reports an intolerance to aspirin and in spite of  having a drug-eluting stent placed to her right coronary artery on December 16, 2010.  She currently is only on Effient 10 mg p.o. daily.  DISCHARGE LABS:  WBC is 7.7, HGB 12.7, HCT 37.1, PLT count is 129, WBC differential on admission was within normal limits.  Sodium 134, potassium 3.8, chloride 98, bicarb 28, BUN 20, creatinine 0.81, glucose 218, calcium 9.2.  Three full sets of cardiac enzymes negative.  FOLLOWUP PLANS AND APPOINTMENTS:  [facility], Steffanie Dunn on April 03, 2011, at 9:15 a.m.  DISCHARGE MEDICATIONS: 1. Benicar 20 mg 1 tablet p.o. daily. 2. Carvedilol 25 mg 1 tablet p.o. b.i.d. with meals. 3. Cymbalta 60 mg one capsule p.o. daily. 4. Effient 10 mg p.o. daily. 5. Furosemide 40 mg 1 tablet p.o. b.i.d. 6. Januvia 100 mg 1 tablet p.o. daily. 7. Lantus sliding scale. 8. Sublingual nitroglycerin 0.4 mg q. 5 minutes up to 3 doses p.r.n.     for chest discomfort. 9. Pravastatin 40 mg p.o. daily.  DURATION OF DISCHARGE ENCOUNTER:  Including physician time was 35 minutes.     Jarrett Ables, PAC   ______________________________ Peter M. Swaziland, M.D.    MS/MEDQ  D:  03/26/2011  T:  03/27/2011  Job:  [id number]  cc:   Doylene Canning. Ladona Ridgel, MD Elana Alm. Nicholos Johns, M.D.  Electronically Signed by Jarrett Ables PAC on 04/05/2011 02:40:04 PM Electronically Signed by PETER Swaziland M.D. on 04/17/2011 03:13:57 PM

## 2011-04-24 ENCOUNTER — Encounter: Payer: Self-pay | Admitting: *Deleted

## 2011-04-24 DIAGNOSIS — I639 Cerebral infarction, unspecified: Secondary | ICD-10-CM

## 2011-04-24 DIAGNOSIS — I420 Dilated cardiomyopathy: Secondary | ICD-10-CM

## 2011-04-24 DIAGNOSIS — I493 Ventricular premature depolarization: Secondary | ICD-10-CM | POA: Insufficient documentation

## 2011-04-24 DIAGNOSIS — I10 Essential (primary) hypertension: Secondary | ICD-10-CM

## 2011-04-24 DIAGNOSIS — E119 Type 2 diabetes mellitus without complications: Secondary | ICD-10-CM | POA: Insufficient documentation

## 2011-04-24 DIAGNOSIS — I2510 Atherosclerotic heart disease of native coronary artery without angina pectoris: Secondary | ICD-10-CM | POA: Insufficient documentation

## 2011-04-24 DIAGNOSIS — E785 Hyperlipidemia, unspecified: Secondary | ICD-10-CM

## 2011-04-26 ENCOUNTER — Encounter: Payer: BC Managed Care – PPO | Admitting: Cardiology

## 2011-05-02 ENCOUNTER — Ambulatory Visit (INDEPENDENT_AMBULATORY_CARE_PROVIDER_SITE_OTHER): Payer: BC Managed Care – PPO | Admitting: Cardiology

## 2011-05-02 ENCOUNTER — Encounter: Payer: Self-pay | Admitting: Cardiology

## 2011-05-02 DIAGNOSIS — I5022 Chronic systolic (congestive) heart failure: Secondary | ICD-10-CM

## 2011-05-02 DIAGNOSIS — E785 Hyperlipidemia, unspecified: Secondary | ICD-10-CM

## 2011-05-02 DIAGNOSIS — I2510 Atherosclerotic heart disease of native coronary artery without angina pectoris: Secondary | ICD-10-CM

## 2011-05-02 DIAGNOSIS — E119 Type 2 diabetes mellitus without complications: Secondary | ICD-10-CM

## 2011-05-02 DIAGNOSIS — I10 Essential (primary) hypertension: Secondary | ICD-10-CM

## 2011-05-02 MED ORDER — FENOFIBRATE 145 MG PO TABS: 145.0000 mg | ORAL_TABLET | Freq: Every day | ORAL | Status: DC

## 2011-05-02 MED ORDER — OLMESARTAN MEDOXOMIL 20 MG PO TABS: 40.0000 mg | ORAL_TABLET | Freq: Every day | ORAL | Status: DC

## 2011-05-02 NOTE — Assessment & Plan Note (Signed)
Her anginal symptoms have resolved. It is very concerning that she developed a significant blockage within such a short period of time from her cardiac catheterization in December. This indicates that she has aggressive coronary disease. We really need to optimize her risk factors as much as possible. She will need to continue Plavix indefinitely. I recommended she enroll in cardiac rehabilitation to start her exercise program. Her long-term goal would be to have her doing 45 minutes of aerobic exercise 5-7 days a week. Have encouraged her to lose weight beginning with portion control.

## 2011-05-02 NOTE — Assessment & Plan Note (Signed)
We will continue with her current therapy but I've asked that she follow up with Dr. Azucena Kuba for management of her diabetes. She may benefit from dietary consultation.

## 2011-05-02 NOTE — Assessment & Plan Note (Signed)
Her last lipid panel in December showed significant elevation of her triglycerides to 443. Given the need to optimize her risk factor management we will add TriCor 145 mg daily to her atorvastatin. We will followup a fasting lipid panel in 2 months. Weight loss will be key.

## 2011-05-02 NOTE — Assessment & Plan Note (Signed)
She is well compensated on exam. She is status post ICD implant. She needs to continue with beta blocker and angiotensin receptor blocker therapy. We will continue with her current diuretic dose. We will increase her Benicar to 40 mg per day to optimize her blood pressure control. We may want to consider the addition of long-acting nitrates and hydralazine in the future for blood pressure allows. She needs to remain on sodium restriction.

## 2011-05-02 NOTE — Assessment & Plan Note (Signed)
Blood pressure is not adequately controlled. We will increase her Benicar to 40 mg per day.

## 2011-05-02 NOTE — Patient Instructions (Addendum)
We will refer you to Cardiac Rehab.  You need to increase your aerobic activity and try to lose weight by reducing your portion sizes and limiting your carbohydrate intake. See Dr. Nicholos Johns about management of your diabetes.  We will increased Benicar to 40 mg daily.  We will add Tricor 145 mg daily to lower your triglycerides.   We will follow up with you in 2 months with fasting lab work.  Stay on Plavix indefinitely.

## 2011-05-02 NOTE — Progress Notes (Signed)
Andrea [name] Date of Birth: 1951-03-18   History of Present Illness: Andrea [name] is seen for followup today. She is status post stenting of the distal right coronary with a 3.5 x 16 mm Promus Element stent on December 15, 2010. She did well initially but began experiencing recurrent chest pain in early April. She had a nuclear stress test which showed evidence of inferior infarction with some period infarct ischemia. She continued to have chest pain he was admitted on April 9 with a small non-ST elevation myocardial infarction. She underwent repeat cardiac catheterization was found to have a high-grade stenosis in the proximal right coronary. This was subtotally stented with a 3.5 x 16 mm Promus Element stent. This was in an area that on previous catheterization had only mild disease. Since discharge she has felt better with resolution of her chest pain. Since she has a history of stroke she was not a candidate for Prasugrel. She was placed on Ticagrelor but developed gout related to this and was switched back to Plavix. She has had no groin complications.  Current Outpatient Prescriptions on File Prior to Visit  Medication Sig Dispense Refill  . acetaminophen (TYLENOL) 500 MG tablet Take 500 mg by mouth as needed.        Marland Kitchen atorvastatin (LIPITOR) 40 MG tablet Take 1 tablet by mouth at bedtime.      . Calcium Citrate (CITRACAL PO) Take 1 tablet by mouth 2 (two) times daily.        . carvedilol (COREG) 25 MG tablet Take 1 tablet by mouth Twice daily.      . clopidogrel (PLAVIX) 75 MG tablet Take 1 tablet (75 mg total) by mouth daily.  30 tablet  5  . DEXILANT 60 MG capsule Take 60 mg by mouth as needed.       . DULoxetine (CYMBALTA) 60 MG capsule Take 60 mg by mouth daily.        . febuxostat (ULORIC) 40 MG tablet Take 80 mg by mouth daily.        . fenofibrate (TRICOR) 145 MG tablet Take 1 tablet (145 mg total) by mouth daily.  30 tablet  11  . furosemide (LASIX) 40 MG tablet Take 1 tablet by mouth  Twice daily.      . insulin glargine (LANTUS) 100 UNIT/ML injection Inject into the skin as directed.       Marland Kitchen JANUVIA 100 MG tablet Take 1 tablet by mouth Daily.      . nitroGLYCERIN (NITROSTAT) 0.4 MG SL tablet Place 0.4 mg under the tongue every 5 (five) minutes as needed.        . ONE TOUCH ULTRA TEST test strip as directed.      Marland Kitchen DISCONTD: BENICAR 20 MG tablet Take 1 tablet by mouth Daily.      Marland Kitchen DISCONTD: prasugrel (EFFIENT) 10 MG TABS Take 10 mg by mouth daily.        Marland Kitchen DISCONTD: pravastatin (PRAVACHOL) 40 MG tablet Take 1 tablet by mouth Daily.        Allergies  Allergen Reactions  . Percocet (Oxycodone-Acetaminophen) Nausea And Vomiting  . Aspirin   . Darvon   . Imitrex (Sumatriptan Base)   . Nsaids   . Propoxyphene Hcl   . Ticagrelor Other (See Comments)    Gout     Past Medical History  Diagnosis Date  . ISCHEMIC HEART DISEASE 02/14/2011  . Chronic systolic heart failure 02/14/2011  . AUTOMATIC IMPLANTABLE CARDIAC DEFIBRILLATOR SITU 02/14/2011  .  Hypertension   . Stroke 02/2010    left brain CVA? recurrent TIA's treated with TPA  . Diabetes mellitus   . Dyslipidemia   . Premature ventricular contractions (PVCs) (VPCs)   . Arthritis   . Migraine headache   . GERD (gastroesophageal reflux disease)   . Coronary artery disease     stenting of right coronary artery. also diagonal stenosis - treated medically  . Hyperlipidemia   . Dilated cardiomyopathy     with an ejection fraction of around 15%  . Pulmonary hypertension     Past Surgical History  Procedure Date  . Cardiac catheterization   . Tonsillectomy   . Oophorectomy     right  . Mass excision     left hip  . Cervical polypectomy     History  Smoking status  . Former Smoker -- 1 years  . Types: Cigarettes  . Quit date: 04/23/1982  Smokeless tobacco  . Never Used    History  Alcohol Use No    Family History  Problem Relation Age of Onset  . Heart failure Mother   . Heart attack Father       Review of Systems: The review of systems is positive for suboptimal glycemic control. She reports that her blood sugars are typically 160-180 in the morning.  All other systems were reviewed and are negative.  Physical Exam: BP 140/88  Pulse 92  Ht 4\' 11"  (1.499 m)  Wt 201 lb 2 oz (91.23 kg)  BMI 40.62 kg/m2 She is an obese black female in no acute distress. HEENT exam is unremarkable. She has no JVD or bruits. Lungs are clear. Cardiac exam reveals a regular rate and rhythm without gallop, murmur, or click. ICD is in place. Abdomen is obese, soft, nontender. She has no groin hematoma. Pedal pulses are good. She has no edema. LABORATORY DATA:   Assessment / Plan:

## 2011-05-03 NOTE — Discharge Summary (Signed)
Andrea, [name]                ACCOUNT NO.:  [id number]  MEDICAL RECORD NO.:  [id number]           PATIENT TYPE:  I  LOCATION:  2919                         FACILITY:  [facility]  PHYSICIAN:  Veverly Fells. Excell Seltzer, MD  DATE OF BIRTH:  Jul 13, 1951  DATE OF ADMISSION:  04/09/2011 DATE OF DISCHARGE:  04/10/2011                              DISCHARGE SUMMARY   PRIMARY CARDIOLOGIST:  Peter M. Swaziland, MD  ELECTROPHYSIOLOGIST:  Doylene Canning. Ladona Ridgel, MD  PRIMARY CARE PROVIDER:  Elana Alm. Nicholos Johns, MD  DISCHARGE DIAGNOSIS:  Non-ST-elevation myocardial infarction status post percutaneous coronary intervention of the right coronary artery with a Promus Element stent. a.  Status post percutaneous intervention and stenting with a Promus Element drug-eluting stent to distal right coronary artery on December 16, 2010.  Continued patency per cardiac catheterization this admission. b.  Scattered coronary artery disease in the circumflex system and left anterior descending.  SECONDARY DIAGNOSES: 1. History of ASPIRIN allergy. 2. Chronic systolic congestive heart failure, class II-III secondary     to nonischemic cardiomyopathy, ejection fraction 15%.     a.     Status post Medtronic implantable cardioverter-      defibrillator. 3. Insulin-dependent diabetes mellitus. 4. Hypertension. 5. Hyperlipidemia. 6. Pulmonary hypertension, pulmonary artery peak pressure of 47 mmHg. 7. Obesity, body mass index of 38.4. 8. History of cerebrovascular accident. 9. Gastroesophageal reflux disease. 10.Depression. 11.Gout. 12.History of thrombocytopenia, platelets ranging from 108-144,000.  ALLERGIES: 1. ASPIRIN causing asthma attacks, upset stomach. 2. DEMEROL causing upset stomach. 3. NSAIDs/COX-2 INHIBITORS causing upset stomach. 4. OXYCODONE causing GI upset.  PROCEDURES/DIAGNOSTICS:  Performed during hospitalization: 1. Placement of catheters without left heart catheterization with     selective coronary  arteriography.     a.     Percutaneous intervention of proximal right coronary artery      with a 3.5 x 28-mm Promus drug-eluting stent.     b.     Continued patency of the distal right coronary artery.     c.     Scattered coronary artery disease of the circumflex system      and left anterior descending.  Left anterior descending with 30%      segmental disease and about 50% ostial disease.  Diffuse      irregularities throughout mid and distal left anterior descending.      Circumflex with 40-50% narrowing in the first bend, followed by 30-      40% at the bifurcation of the marginal, 30% narrowing in the      proximal marginal, 30-50% narrowing in the inferior branch. 2. Chest x-ray on April 09, 2011:  AICD in place.  Stable moderate     enlargement of the cardiac silhouette.  No acute cardiopulmonary     process seen.  REASON FOR HOSPITALIZATION:  This is a 60 year old African American female with known coronary artery disease and recent PCI in December 2011.  The patient states for over a month she has had complaints of headache that radiates down to her ear and down to her chest causing chest pain, this was associated with shortness of breath.  She ruled out for myocardial infarction in March 2012 and had a followup Myoview that demonstrated an anterior myocardial infarction with peri-infarct ischemia.  The patient continued to have intermittent chest pain with the above symptoms although she denied rest pain.  The patient states her chest pain was relieved after several minutes of rest.  The patient presented to the emergency department for further evaluation.  Her initial troponin was noted to be 0.41, her EKG did show new minimal ST depression/flattening in the lateral leads that are different from prior tracing in March 2012.  Cardiology admitted the patient for further evaluation.  HOSPITAL COURSE:  The patient was taken to the cardiac catheterization lab by Dr. Riley Kill,  informed consent was obtained.  Dr. Riley Kill performed placement of catheter for angiography without left heart catheterization and with selective coronary arteriography.  As above, the patient was noted to have an 85-90% stenosis proximally that was followed by 50-60% segmental disease around the first bend.  Dr. Riley Kill completed successful percutaneous stenting of the proximal right coronary artery with resolution of stenosis from 80% and 50% to 0% stenosis.  Again, there was continued patency of the distal right coronary artery with scattered disease of the circumflex and LAD.  The patient tolerated the procedure well and was taken for observation to CCU.  Dr. Riley Kill noted with the patient's history of TIAs that the patient should be switched from Effient to [facility], the patient is agreeable with this.  Of note, the patient does have an allergy to ASPIRIN as it causes asthma attacks and therefore, she will not be placed on aspirin.  The patient will have continued medical management of cardiac risk factors.  Dr. Excell Seltzer would like to change the patient's statin from pravastatin to Lipitor in the setting of aggressive coronary artery disease.  The patient will also be continued on Coreg and Benicar.  On the day of discharge, Dr. Excell Seltzer evaluated the patient and noted her stable for home after ambulating in the hall.  The patient was hemodynamically stable.  She was without complaints of chest pain or shortness of breath.  The patient will have close outpatient followup.  DISCHARGE LABORATORY DATA:  Troponin 0.41, 0.61, 0.83, CK and CK-MB within normal limits.  Sodium 141, potassium 4, BUN 19, creatinine 0.94. Hemoglobin 12.2, hematocrit 35.5.  DISCHARGE MEDICATIONS: 1. Lipitor 40 mg daily. 2. Brilinta 90 mg one tablet every 12 hours. 3. Benicar one tablet daily. 4. Carvedilol 25 mg one tablet twice daily. 5. Cymbalta 60 mg one capsule daily. 6. Dexilant 60 mg one capsule  daily. 7. Furosemide 40 mg twice daily. 8. Januvia 100 mg one tablet daily. 9. Lantus sliding-scale insulin subcutaneously daily. 10.Nitroglycerin 0.4 mg one tablet sublingual under tongue at the     onset of chest pain, may repeat every 5 minutes up to three doses. Please stop taking pravastatin and Effient.  FOLLOWUP PLANS AND INSTRUCTIONS: 1. The patient will follow up with Dr. Swaziland on April 26, 2011, at     11:15. 2. The patient is to increase activity slowly.  She may shower but no     bathing.  She is not to lift anything for 1 week greater than 5     pounds.  No driving for 2 days.  No sexual activity for 1 week.     She is to keep her cath site clean and dry and call office for any     problems. 3. The patient is to continue a low-sodium  heart-healthy and diabetic     diet. 4. The patient is to stop any activity that causes chest pain or     shortness of breath. 5. The patient is to call the office in the interim for any problems     or concerns.  DURATION OF DISCHARGE:  Greater than 30 minutes with physician and physician extender time.     Leonette Monarch, PA-C   ______________________________ Veverly Fells. Excell Seltzer, MD    NB/MEDQ  D:  04/10/2011  T:  04/11/2011  Job:  [id number]  cc:   Peter M. [place]il[place], M.D. Doylene Canning. Ladona Ridgel, MD Elana Alm. Nicholos Johns, M.D.  Electronically Signed by Alen Blew P.A. on 04/12/2011 02:14:10 PM Electronically Signed by Tonny Bollman MD on 05/03/2011 10:25:59 AM

## 2011-05-03 NOTE — H&P (Signed)
Andrea [name], Andrea [name]                ACCOUNT NO.:  [id number]  MEDICAL RECORD NO.:  [id number]           PATIENT TYPE:  I  LOCATION:  2919                         FACILITY:  [facility]  PHYSICIAN:  Veverly Fells. Excell Seltzer, MD  DATE OF BIRTH:  Jan 14, 1951  DATE OF ADMISSION:  04/09/2011 DATE OF DISCHARGE:                             HISTORY & PHYSICAL   PRIMARY CARDIOLOGIST:  Peter M. [place]il[place], MD  ELECTROPHYSIOLOGIST:  Doylene Canning. Ladona Ridgel, MD  PRIMARY CARE PROVIDER:  Elana Alm. Reade, MD  REASON FOR HOSPITALIZATION:  Chest pain and shortness of breath in a patient with known coronary artery disease.  PAST MEDICAL HISTORY.:   1. Coronary artery disease, status post percutaneous intervention and  stenting with a Promus Element drug-eluting stent (this is a 3.5 x 16 mm)  to the distal right coronary artery on December 16, 2010.     a.  Cardiac catheterization on December 16, 2010, normal left main, 30%     LAD and D1 70%, circumflex, 30% range at the takeoff of a large OM, RCA     with 30% proximal disease, 30% PDA. 2. Chronic systolic congestive heart failure, class II to III     secondary to nonischemic cardiomyopathy, LVEF 15%.     a.     Status post Medtronic ICD. 3. Insulin-dependent diabetes mellitus. 4. Hypertension. 5. Hyperlipidemia. 6. Pulmonary hypertension, PA peak pressure of 47 mmHg. 7. Obesity, BMI 38.4. 8. History of CVA. 9. Gastroesophageal reflux disease. 10. Degenerative disk disease. 11.Thrombocytopenia, platelets ranging from 108-144,000. 12.Gout. 13.Depression. 14.Status post tonsillectomy. 15.Status post oophorectomy. 16.Status post foot surgery.  HISTORY OF PRESENT ILLNESS:  This is a 60 year old African American female with known coronary artery disease, as stated above.  She has  been having intermittent chest pain over the last month.  This has been associated with headaches, ear pain as well as shortness of breath. The patient ruled out for myocardial infarction  in March 2012 and had a follow up Myoview that demonstrated an anterior myocardial infarction with peri-infarct ischemia.  The patient has continued to have onset of headache, ear pain and resulting in the chest pain with minimal activity.  This is relieved at rest.  She denied any rest pain.  The patient does state that nitroglycerin has minimal affect.  If she is lying flat then pain is relieved with sitting up.  She spoke with the office this morning and was told to come to the emergency department for further evaluation as she is continued to have chest pain.  Currently, at this time, the patient is chest pain free.  The patient's troponin is 0.41.  Her EKG does show new minimal ST depression/flattening in the lateral leads that are changed from March.  Cardiology was asked to admit the patient.  SOCIAL HISTORY:  The patient lives in [place] with her daughter and her partner.  She is currently unemployed.  She has a history of tobacco abuse, but quit in 1983.  She denies any alcohol or illicit drug use.  FAMILY HISTORY:  Noncontributory for early coronary artery disease.  Her father did have a myocardial  infarction in his 29s.  Her mother had congestive heart failure in her 54s as well.  ALLERGIES: 1. ASPIRIN causing asthma attack, upset stomach. 2. DEMEROL causing upset stomach. 3. NSAID/COX II INHIBITORS causing upset stomach. 4. OXYCODONE causing GI upset.  HOME MEDICATIONS: 1. Benicar 20 mg daily. 2. Coreg 25 mg twice daily. 3. Cymbalta 60 mg daily. 4. Effient 10 mg daily. 5. Lasix 40 mg twice daily. 6. Januvia 100 mg daily. 7. Lantus sliding scale insulin. 8. Pravastatin 40 mg daily. 9. Sublingual nitroglycerin as needed.  REVIEW OF SYSTEMS:  All pertinent positives and negatives as well as intermittent nausea, as described in HPI.  All other systems have been reviewed and are negative.  PHYSICAL EXAMINATION:  VITAL SIGNS:  Temperature 97.7, pulse  94, respirations 15, blood pressure 115/76, O2 saturation 98% on room air. GENERAL:  Well-developed, well-nourished female.  No acute distress. HEENT:  Normal. NECK:  Supple without bruit or JVD. HEART:  Regular rate and rhythm with S1 and S2.  No murmur, rub or gallop noted.  PMI is normal.  Pulses 2+ and equal bilaterally. LUNGS:  Clear to auscultation bilaterally without wheezes, rales or rhonchi. ABDOMEN:  Obese, soft, nontender.  Positive bowel sounds x4. EXTREMITIES:  No clubbing, cyanosis or edema. MUSCULOSKELETAL:  No joint deformities or effusions. NEURO:  Alert and oriented x3, cranial nerves II through XII grossly intact.  Chest x-ray showing stable moderate enlargement of the cardiac silhouette.  AICD in place.  No pulmonary edema, pneumonia or pleural effusion noted.  EKG showing no inferior lateral ST-T wave changes.  WBC 6.2, hemoglobin 13.1, hematocrit 38.4, platelet 145,00.  Sodium 140, potassium 3.6, chloride 102, bicarb 28, BUN 22, creatinine 0.96, glucose 155.  ASSESSMENT/PLAN:  This is a 60 year old [place] American female with known coronary artery disease, status post PCI to the RCA in November 2011.  The patient presents with intermittent chest pain and recent Myoview showing inferior infarct with peri-infarct ischemia. 1. Chest pain.  The patient's chest pain is with atypical features.     With the patient's EKG showing new inferior lateral ST - T-wave     changes and rhythm, recent Myoview with inferior infarct and peri-     infarct ischemia, we currently recommend recatheterization.  Risks,     benefits and indications have been discussed with the patient and     she agrees to proceed.  The patient is currently n.p.o. and     catheterization has been scheduled for this afternoon.  Of note,     the patient does have an aspirin allergy, but will be continued on     Effient 10 mg daily as well as her beta-blocker, statin and ARB. 2. Nonischemic  cardiomyopathy, ejection fraction of 15%.  The patient     will be continued on a IV and beta-blocker. 3. Hypertension, stable. 4. Hyperlipidemia, continue pravastatin. 5. Insulin-dependent diabetes mellitus, continue current medication as     well as sliding scale insulin.  Further treatment to be dependent upon catheterization results.     Leonette Monarch, PA-C   ______________________________ Veverly Fells. Excell Seltzer, MD    NB/MEDQ  D:  04/09/2011  T:  04/10/2011  Job:  [id number]  Electronically Signed by Alen Blew P.A. on 04/19/2011 12:21:05 PM Electronically Signed by Tonny Bollman MD on 05/03/2011 10:26:06 AM

## 2011-05-07 ENCOUNTER — Telehealth: Payer: Self-pay | Admitting: Cardiology

## 2011-05-07 NOTE — Telephone Encounter (Signed)
Not sure why she is coughing. Not on an ACEi. CXR in April looked ok. Could try taking Lasix bid for 3 days but if no better will need to see PCP. Theron Arista Sw[place]and

## 2011-05-07 NOTE — Telephone Encounter (Signed)
Pt calling C/O a dry hacking cough.  States this has been going on since discharge from the hospital in April 2012;  C/O dry throat.  I asked if this could be R/T allergies and she stated she did not think it was allergies, that she never coughs with allergies.  No swelling noted in the ankles; states she weighs daily and no changes have been noted;  Will speak with Dr. Swaziland for any recommendations.  Explained she may need to F/U with her PCP.

## 2011-05-07 NOTE — Telephone Encounter (Signed)
PT HAS A QUESTION. CHART PLACED IN BOX.

## 2011-05-08 ENCOUNTER — Telehealth: Payer: Self-pay | Admitting: *Deleted

## 2011-05-08 NOTE — Telephone Encounter (Signed)
Pt was instructed per Dr. Swaziland to try Lasix BID X3 days for the cough to see if this resolves.  She verbalized an understanding of these instructions.  She was to call PCP if symptoms did not resolve.

## 2011-05-08 NOTE — Telephone Encounter (Signed)
L/M with pt to call back regarding her cough and Dr. Elvis Coil recommendations

## 2011-05-15 NOTE — H&P (Signed)
NAMEMICHOL, EMORY NO.:  [id number]   MEDICAL RECORD NO.:  [id number]           PATIENT TYPE:   LOCATION:                                 FACILITY:   PHYSICIAN:  Peter M. Swaziland, M.D.       DATE OF BIRTH:   DATE OF ADMISSION:  09/15/2007  DATE OF DISCHARGE:                              HISTORY & PHYSICAL   HISTORY OF PRESENT ILLNESS:  [name] is a 60 year old African-American  female who was initially seen by myself in December 2007 with symptoms  of increased dyspnea and cough.  She has a history of hypertension,  diabetes and hypercholesterolemia.  She also has history of acid reflux  disease and asthma.  Her symptoms began after she caught a bad virus  from her son.  After the cold symptoms resolved, she still had  persistent shortness of breath, orthopnea, and a nonproductive cough.  She was short of breath continually.  It was worse with exertion.  She  had no significant increase in weight or edema.  She initially had a  trial of Spiriva without improvement.  Chest x-ray showed moderate  cardiomegaly with mild interstitial prominence consistent with cardiac  decompensation.  She subsequently had an echocardiogram which  demonstrated severe left ventricular dysfunction with ejection fraction  of 25-35%.  She had mild concentric LVH, moderate mitral insufficiency  and moderate pulmonary hypertension.  She was treated with aggressive  medical therapy including angiotensin receptor blockers, diuretics and  Coreg.  She subsequently complained of excessive dizziness and  palpitations.  A 24-hour Holter monitor demonstrated frequent PVCs with  couplets and triplets.  She subsequently underwent an adenosine  Cardiolite study which showed significant evidence of anterior wall  ischemia.  Ejection fraction by that study was 26%.  Given these  findings, cardiac catheterization was recommended. It is noteworthy that  Andrea [name] had a cardiac catheterization performed  in 2001 which  demonstrated normal coronaries.  The patient may need to be considered  for implantable ICD   PAST MEDICAL HISTORY:  1. Diabetes mellitus, type 2.  2. Hypertension.  3. Hypercholesterolemia.  4. Gastroesophageal reflux disease.  5. Asthma.  6. Arthritis.  7. History of migraine headaches.  8. History of back pain.   PRIOR SURGERIES:  1. Right oophorectomy.  2. Placement of a screw in her left foot.  3. She has had previous tonsillectomy.  4. She has had a polyp removed from her cervix.  5. She had some type of mass removed from her left hip.   ALLERGIES:  1. She is allergic to DARVON.  2. She reports intolerance to ASPIRIN.   CURRENT MEDICATIONS:  1. Glyburide 5 mg b.i.d.  2. Benicar/hydrochlorothiazide 40/25 mg per day.  3. Tricor 48 mg per day.  4. Crestor 40 mg per day.  5. Lasix 40 mg per day.  6. Potassium 20 mEq b.i.d.  7. Coreg 12.5 mg b.i.d.  8. Magnesium oxide 400 mg per day.  9. Allopurinol 50 mg b.i.d.  10.Insulin 22 units daily   FAMILY HISTORY:  Father died in  his 71s of congestive heart failure.  Mother died in her 19s from myocardial infarction.  Four brothers are  living and in good health.  One brother died of unknown causes at age  53.  One brother was murdered in his 52s.   SOCIAL HISTORY:  The patient is retired.  She is single.  She has one  daughter.  She quit smoking 25 years ago.  She denies alcohol use.   REVIEW OF SYSTEMS:  Are as noted in HPI, otherwise negative.   PHYSICAL EXAMINATION:  GENERAL:  The patient is an obese black female in  no apparent distress.  VITAL SIGNS:  Her weight is 226. Blood pressure is 112/78, pulse 100 and  regular.  HEENT:  She is normocephalic, atraumatic.  Pupils equal, round, and  reactive to light and accommodation.  Extraocular movements are full.  Oropharynx is clear.  NECK:  Without JVD, adenopathy, thyromegaly or bruits.  LUNGS:  Clear.  CARDIAC:  Exam reveals a soft grade 1/6 systolic  murmur at the apex  without S3.  ABDOMEN:  Soft and nontender.  No hepatosplenomegaly, masses or bruits.  EXTREMITIES:  Femoral and pedal pulses are 2+ and symmetric.  NEUROLOGIC:  Exam is nonfocal   LABORATORY DATA:  ECG at rest shows normal sinus rhythm with frequent  PVCs, otherwise normal.   Chest x-ray shows cardiomegaly, otherwise no active disease.   IMPRESSION:  1. Abnormal adenosine Cardiolite study showing evidence of anterior      wall ischemia.  2. Dilated cardiomyopathy with persistently low ejection fraction      despite optimal medical therapy.  3. Frequent ventricular ectopy.  4. Diabetes mellitus, insulin requiring.  5. Hypertension.  6. Hyperlipidemia.   PLAN:  The patient will undergo diagnostic cardiac catheterization with  further therapy pending these results.           ______________________________  Peter M. Swaziland, M.D.     PMJ/MEDQ  D:  09/08/2007  T:  09/08/2007  Job:  [id number]   cc:   Molly Maduro A. Nicholos Johns, M.D.

## 2011-05-15 NOTE — Op Note (Signed)
Andrea [name], FOULK NO.:  [id number]   MEDICAL RECORD NO.:  [id number]          PATIENT TYPE:  OIB   LOCATION:  3731                         FACILITY:  [facility]   PHYSICIAN:  Doylene Canning. Ladona Ridgel, MD    DATE OF BIRTH:  October 23, 1951   DATE OF PROCEDURE:  11/03/2007  DATE OF DISCHARGE:                               OPERATIVE REPORT   PROCEDURE PERFORMED:  Implantation of a single chamber defibrillator.   INDICATIONS:  Nonischemic cardiomyopathy, EF 30%, class II heart  failure.   INTRODUCTION:  The patient is a very pleasant 60 year old woman with  longstanding coronary disease status post MI with a nonischemic  cardiomyopathy and EF of 30%.  She is now referred for ICD implantation.  She had been on maximal medical therapy with beta blockers diuretics and  angiotensin receptor antagonists.   PROCEDURE:  After informed consent was obtained, the patient was taken  to the diagnostic EP lab in fasting state.  After the usual preparation  and draping, intravenous fentanyl and midazolam was given for sedation.  A total of 30 mL of lidocaine was infiltrated in the left  infraclavicular region.  A 7 cm incision was carried out over this  region.  Electrocautery was utilized to dissect down to the fascial  plane.  The left subclavian vein was punctured and the Medtronic model  6947 65 cm active fixation pacing lead serial number [id number] was  advanced into the right ventricle.  Mapping was carried out at the final  site on the RV septum, the R-waves measured 11 mV, the pacing threshold  0.5 volts at 0.5 milliseconds and the pacing impedance was 983 ohms.  There is a nice entry current with active fixation and 10 volt pacing  did not stimulate the diaphragm.  With the defibrillation lead in  satisfactory position, it secured to subpectoralis fascia with a figure-  of-eight silk suture.  The sewing sleeve was also secured with silk  suture.  Electrocautery was utilized to make  a subcutaneous pocket and  kanamycin irrigation was utilized to irrigate the pocket.  Electrocautery was utilized to assure hemostasis.  The Medtronic Maximo  VR model 7232 single chamber defibrillator, serial number [id number] was  connected to the defibrillation lead and placed back in the subcutaneous  pocket.  Generator was secured with silk suture.  At this point,  additional kanamycin was utilized to irrigate the device and  defibrillation threshold testing carried out.   After the patient was more deeply sedated with fentanyl and Versed, VF  was induced with a T-wave shock and a 15 joule shock was delivered which  terminated VF and restored sinus rhythm.  At this point, no additional  defibrillation threshold testing was carried out and the incision was  closed with a layer of 2-0 Vicryl, followed by layer of 3-0 Vicryl.  Benzoin was painted on the skin and Steri-Strips were applied and  pressure dressing was placed.  The patient was returned to her room in  satisfactory condition.   COMPLICATIONS:  There were no immediate complications.   RESULTS:  This demonstrates  successful implantation of a Medtronic  single-chamber defibrillator in a patient with a nonischemic  cardiomyopathy and congestive heart failure, EF 30% despite maximal  medical therapy.      Doylene Canning. Ladona Ridgel, MD  Electronically Signed     GWT/MEDQ  D:  11/03/2007  T:  11/04/2007  Job:  [id number]   cc:   Peter M. [place], M.D.

## 2011-05-15 NOTE — Assessment & Plan Note (Signed)
[facility]                         ELECTROPHYSIOLOGY OFFICE NOTE   [name], [name]                         MRN:          [id number]  DATE:11/24/2007                            DOB:          11/30/1951    Andrea [name] was seen today in the device clinic for follow up of her newly  implanted Medtronic Maximo ICD.  Date of implant was November 03, 2007,  for nonischemic cardiomyopathy.  Interrogation of her device  demonstrates R waves of 12.1 millivolts with an RV impedance of 488 ohms  and a threshold of 1 volt of 0.2 milliseconds.  Shock impedance were 40  and 51 ohms.  Battery voltages was 3.23 volts with a charge time of 7.94  seconds . She had not had any episodes of any arrhythmias since implant.  She is V pacing less than 0.1%.  Her Steri-Strips were removed today.  Her wound was without redness or swelling.  She will return to the  clinic in three months.      Andrea Balsam, RN,BSN  Electronically Signed      Doylene Canning. Ladona Ridgel, MD  Electronically Signed   AS/MedQ  DD: 11/24/2007  DT: 11/24/2007  Job #: [id number]   cc:   Peter M. Swaziland, M.D.

## 2011-05-15 NOTE — Letter (Signed)
October 08, 2007    Peter M. Swaziland, M.D.  [address], [address]  [place], [place] [address]   RE:  PAULEEN, GOLEMAN  MRN:  [id number]  /  DOB:  December 28, 1951   Dear Theron Arista:   Thank you for referring Ms. Davon Bowe for EP evaluation and  consideration for ICD implantation.  As you know, she is a very pleasant  60 year old woman with a history of nonischemic cardiomyopathy and  congestive heart failure with severe LV dysfunction and an EF of 30%.  Since being begun on heart failure medications, her symptoms have  improved, but she remains class II with severe LV dysfunction, EF 30%.  The patient has never had syncope.  She is referred today for  consideration for prophylactic ICD insertion.  She has been maximally  medically treated on an ARB, beta blockers, and diuretics.  Despite  this, she continues to have heart failure symptoms.  Her QRS is  negative.  Her physical exam is unremarkable, except that she is obese,  and has evidence of cardiac enlargement on physical exam.   I discussed treatment options with [name] in detail, specifically the  risks, benefits, goals, and expectations of prophylactic ICD  implantation.  She is considering her options, and she will call us if  she would like to proceed with ICD implant.   Thank you for referring [name] for EP evaluation.  I look forward to  hearing from her, and will keep you apprised as to if and when she  decides to proceed with ICD implantation.    Sincerely,      Doylene Canning. Ladona Ridgel, MD  Electronically Signed    GWT/MedQ  DD: 10/08/2007  DT: 10/09/2007  Job #: [id number]   CC:    Elana Alm. Nicholos Johns, M.D.

## 2011-05-15 NOTE — Op Note (Signed)
[name], [name] NO.:  [id number]   MEDICAL RECORD NO.:  [id number]          PATIENT TYPE:  OIB   LOCATION:  3536                         FACILITY:  [facility]   PHYSICIAN:  Tia Alert, MD     DATE OF BIRTH:  10-25-1951   DATE OF PROCEDURE:  06/22/2009  DATE OF DISCHARGE:                               OPERATIVE REPORT   PREOPERATIVE DIAGNOSIS:  Cervical spondylosis with cervical spinal  stenosis, C4-C5, C5-C6 with neck and left arm pain.   POSTOPERATIVE DIAGNOSIS:  Cervical spondylosis with cervical spinal  stenosis, C4-C5, C5-C6 with neck and left arm pain.   PROCEDURES:  1. Decompressive anterior cervical diskectomy, C4-C5, C5-C6.  2. Anterior cervical arthrodesis, C4-C5, C5-C6 utilizing a 6-mm PEEK      interbody cage packed with local autograft and Actifuse putty at C4-      C5 and an 8-mm cage packed with local autograft and Actifuse putty      at C5-C6.  3. Anterior cervical plating utilizing a 42-mm Atlantis Venture plate.   SURGEON:  Tia Alert, MD   ASSISTANT:  Donalee Citrin, MD   ANESTHESIA:  General endotracheal.   COMPLICATIONS:  None apparent.   INDICATIONS FOR PROCEDURE:  Andrea [name] is a 60 year old female who is  referred with neck and left arm pain.  She had a CT myelogram which  showed spinal stenosis at C4-C5 and C5-C6, recommended two-level  decompressive anterior cervical diskectomy with fusion and plating at C4-  C5 and C5-C6.  The patient shows PEEK interbody grafts as her interbody  constructs.  She understood the risks, benefits, expected outcome, and  wished to proceed.   DESCRIPTION OF PROCEDURE:  The patient was taken to the operating room  and after induction of adequate generalized endotracheal anesthesia, she  was placed in supine position on the operating room table.  Her right  anterior cervical region was prepped with DuraPrep and then draped in  usual sterile fashion.  A 5 mL of local anesthesia was injected and a  transverse incision was made to the right of midline and carried down to  the platysma which was elevated, opened, and undermined with Metzenbaum  scissors.  I then dissected a section of plane medial to the  sternocleidomastoid muscle and internal carotid artery and lateral to  the trachea and esophagus to expose C4-C5 and C5-C6.  Intraoperative  fluoroscopy confirmed my levels and then I took down the longus colli  muscles and placed the Shadow-Line retractors under these to expose C4-  C5 and C5-C6.  The annulus was incised and the initial diskectomy was  done with pituitary rongeurs and curved curettes.  We then used the high-  speed drill to drill the endplates down to the level of the posterior  longitudinal ligament, posterior osteophytes.  The drill shavings were  saved in a mucous trap for later arthrodesis.  These had been mixed with  Actifuse putty.  The operating microscope was brought into the field and  was started at C5-C6.  We opened the paraspinous ligament with a nerve  hook  and then removed it by undercutting the bodies of C5 and C6.  I  angled the scope up and down to look up under the bodies as best I could  and angled my Kerrison and divided up under the C5 and C6 vertebral  bodies until the dura was full all the way across.  I marched along the  superior endplate of C6 to identify the pedicles, marched along the  pedicles, identified the axillary fat and a nerve root, identified the  nerve root and decompressed the nerve roots into the respective  foramina, especially on the left side.  I then palpated  circumferentially with a nerve hook to assure adequate decompression.  I  then performed the exact same decompression of C4-C5 by opening the  posterior longitudinal ligament with a nerve hook and undercutting the  bodies of C4-C5 until the nerve roots were decompressed and the central  canal was decompressed and the dura was full.  I then palpated  circumferentially  once again with a nerve hook to assure adequate  decompression.  I then measured the interspaces to be 6 mm at C4-C5 and  8 mm at C5-C6.  I used corresponding PEEK interbody cages packed with  local autograft and Actifuse putty and tapped these into position.  I  then used a 42-mm Atlantis Venture plate, placed two 30-mm variable  angle screws in the bodies of C4, C5, and C6.  These were locked into  plate by locking mechanism within the plate.  We then irrigated with  saline solution, dried all bleeding points with bipolar cautery and  Surgifoam.  Once the meticulous hemostasis was achieved, closed the  platysma with 3-0 Vicryl, closed the subcuticular tissue with 3-0  Vicryl, and closed the skin with benzoin and Steri-Strips.  The drapes  were removed.  A sterile dressing was applied.  The patient was awakened  from general anesthesia and transferred to the recovery room in stable  condition.  At the end of the procedure, all sponge, needle, and  instrument counts were correct.      Tia Alert, MD  Electronically Signed     DSJ/MEDQ  D:  06/22/2009  T:  06/23/2009  Job:  [id number]

## 2011-05-15 NOTE — Assessment & Plan Note (Signed)
[facility]                         ELECTROPHYSIOLOGY OFFICE NOTE   ADMIRE, BUNNELL                         MRN:          [id number]  DATE:10/08/2007                            DOB:          May 14, 1951    The patient is referred today for evaluation of congestive heart failure  and consideration for prophylactic ICD insertion by Peter M. Swaziland,  M.D.  The patient is a very pleasant 60 year old woman who has had a  history of congestive heart failure symptoms for over a year.  She has  peripheral edema and worsening dyspnea and ultimately sought medical  attention where she underwent catheterization demonstrating no  obstructive coronary disease and two-dimensional echocardiogram  demonstrated severe LV dysfunction with EF of 30%.  She has been treated  with maximum medical therapy, continued to have severe LV dysfunction,  and at least class II heart failure symptoms.  She has never had  syncope.  She denies chest pain.   PAST MEDICAL HISTORY:  Notable for seasonal allergic rhinitis.  She has  a history of asthma remotely.  She has a history of hypertension.  She  has a history of diabetes.  Her catheterization demonstrated no  obstructive coronary artery disease.   FAMILY HISTORY:  Notable for a mother who died suddenly.   SOCIAL HISTORY:  The patient denies tobacco or ethanol abuse.   REVIEW OF SYSTEMS:  She has dyspnea with exertion.  She has shortness of  breath.  She has very mild amounts of peripheral edema.  The rest of her  review of systems was reviewed and found to be negative except as noted  above with palpitations being a predominant symptom.   PHYSICAL EXAMINATION:  GENERAL:  She is a pleasant, chronically ill-  appearing, middle aged woman in no acute distress.  VITAL SIGNS:  Blood pressure 122/80, pulse 98 and regular, respirations  16, and weight 224 pounds.  HEENT:  Normocephalic and atraumatic.  Pupils equal, round, and  reactive  to light.  Oropharynx moist.  Sclerae anicteric.  NECK:  There was 7 cm of jugular venous distention, no thyromegaly.  Trachea was midline.  Carotids were 2+ and symmetric.  LUNGS:  Clear to auscultation bilaterally.  No wheezes, rales, or  rhonchi present.  I did not appreciate any increased work of breathing.  CARDIOVASCULAR:  Regular rate and rhythm with normal S1 and S2.  There  was a soft S4 gallop.  The PMI was enlarged and laterally displaced.  ABDOMEN:  Soft, nontender, and nondistended.  There was no organomegaly.  Bowel sounds were present.  No rebound or guarding.  EXTREMITIES:  No cyanosis or clubbing.  There was trace peripheral  edema.  NEUROLOGY:  Alert and oriented x3 with cranial nerves intact.  Strength  was 5/5 and symmetric.  SKIN:  Normal.  JOINTS:  Normal.   EKG demonstrates sinus rhythm with a narrow QRS.  There are multiple  PVC's present.   IMPRESSION:  1. Nonischemic cardiomyopathy.  2. Class II congestive heart failure with EF of 30% despite maximal      medical therapy.  DISCUSSION:  I have discussed treatment options with the patient in  detail, the risks, benefits, goals, and expectations of prophylactic ICD  insertion have been discussed with the patient and she is considering  her options and she will call us if she would like to proceed with ICD  implant.     Doylene Canning. Ladona Ridgel, MD  Electronically Signed    GWT/MedQ  DD: 10/08/2007  DT: 10/09/2007  Job #: [id number]   cc:   Peter M. Swaziland, M.D.  Robert A. Nicholos Johns, M.D.

## 2011-05-15 NOTE — Cardiovascular Report (Signed)
Andrea [name], REZEK NO.:  [id number]   MEDICAL RECORD NO.:  [id number]          PATIENT TYPE:  OIB   LOCATION:  1962                         FACILITY:  [facility]   PHYSICIAN:  Peter M. Swaziland, M.D.  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  Cardiolite  study suggested anterior wall ischemia.  Ejection fraction is 25-30% by  echocardiogram and by nuclear stress test, this despite optimal medical  therapy   PROCEDURE:  1. Left heart catheterization.  2. Coronary and left ventricular angiography.   EQUIPMENT USED:  4-French 4 cm left Judkins catheter, 4-French 3 DRC  catheter, 4-French pigtail catheter, 4-French arterial sheath.   MEDICATIONS:  Local anesthesia with 1% Xylocaine; Valium 10 mg p.o.  prior to procedure.   CONTRAST:  100 mL of Omnipaque.   HEMODYNAMIC DATA:  Aortic pressure was 99/61 with a mean of 78 mmHg.  Left ventricle pressure is 99 with EDP of 19 mmHg.   ANGIOGRAPHIC DATA:  The left coronary artery arises and distributes  normally.  The left main coronary is normal.   Left anterior descending artery has a fusiform 20-30% narrowing after  the first diagonal branch.  No obstructive disease is noted.  First  diagonal branch appears normal.   The left circumflex coronary artery gives rise two marginal vessels.  The first marginal vessel has a 20% narrowing proximally.   The right coronary artery arises and distributes normally.  It is a very  large dominant vessel.  It has scattered irregularities less than or  equal to 20% in the proximal and mid vessel.   Left ventricular angiography was performed in the RAO view.  This  demonstrates mild left ventricular enlargement.  There is global  hypokinesia with overall severe  left ventricular dysfunction.  Ejection  fraction is estimated 30%.  There is no significant mitral  insufficiency.   FINAL INTERPRETATION:  1. Nonobstructive atherosclerotic coronary artery disease.  2. Severe left ventricular dysfunction.   PLAN:  Would consider prophylactic ICD placement for risk reduction of  sudden death.           ______________________________  Peter M. Swaziland, M.D.     PMJ/MEDQ  D:  09/15/2007  T:  09/15/2007  Job:  [id number]   cc:   Molly Maduro A. Nicholos Johns, M.D.

## 2011-05-15 NOTE — Discharge Summary (Signed)
NAMELAN, ENTSMINGER NO.:  [id number]   MEDICAL RECORD NO.:  [id number]          PATIENT TYPE:  OIB   LOCATION:  3731                         FACILITY:  [facility]   PHYSICIAN:  Doylene Canning. Ladona Ridgel, MD    DATE OF BIRTH:  Sep 21, 1951   DATE OF ADMISSION:  11/03/2007  DATE OF DISCHARGE:  11/04/2007                               DISCHARGE SUMMARY   The patient has allergies to DARVON, ASPIRIN, IMITREX NSAIDs, CODEINE.   Dictation and exam greater than 35 minutes.   FINAL DIAGNOSES:  1. Discharging day #1 status post implant of Medtronic Maximo single-      chamber cardioverter-defibrillator.  2. New York Heart Association class II chronic systolic congestive      heart failure.  3. Left heart catheterization September 15, 2007, for abnormal Myoview      study.  Ejection fraction 30%.  This patient is on maximum medical      therapy.  She has nonobstructive coronary artery disease.  4. Nonischemic cardiomyopathy.   SECONDARY DIAGNOSES:  1. Myoview study ejection fraction 26%, presence of anterior wall      ischemia.  2. Moderate pulmonary hypertension.  3. Hypertension.  4. Dyslipidemia.  5. Diabetes.  6. Gastroesophageal reflux disease.  7. Arthritis.  8. Migraines.  9. Chronic back pain.  10.Remote history of asthma.   PROCEDURE:  November 03, 2007, implant of Medtronic single-chamber Maximo  VR cardioverter-defibrillator, defibrillator threshold study less than  or equal to 15 joules, Dr. Lewayne Bunting.   BRIEF HISTORY:  [name] is a 60 year old female referred for evaluation  of congestive heart failure with consideration for prophylactic  cardioverter-defibrillator insertion, Dr. Peter Swaziland making the  referral.   The patient is a 59 year old female.  She had a history of congestive  heart failure symptoms greater than a year.  Her symptoms are peripheral  edema and worsening dyspnea.  She had a catheterization showing  nonobstructive coronary artery  disease.  Echocardiogram showed severe  left ventricular dysfunction of an ejection fraction of 30%.  She  continues to have severe left ventricular dysfunction even on maximum  medical therapy, which includes Coreg, Benicar, Crestor, Lasix,  potassium.  Her New York Heart Association symptoms are class II.  The  patient has had no syncope and denies chest pain.   The risks and benefits of ICD implantation have been discussed with the  patient.  She was to proceed.   HOSPITAL COURSE:  The patient presented electively November 3.  She  underwent implantation of a single chamber cardioverter-defibrillator  Dr. Ladona Ridgel with defibrillator threshold study.  Patient discharging  postprocedure day #1.  There is no hematoma at the incision and ICD  pocket.  Mobility and incision care have been discussed with the  patient.  The device has been interrogated, values within normal limits.  Chest x-ray examined.  The lead is in appropriate position.   The patient discharges on the following medications:  1. Coreg 25 mg twice daily.  2. Tricor 48 mg daily.  3. Crestor 40 mg daily at bedtime.  4. Lasix  40 mg daily.  5. Potassium chloride 20 mEq twice daily.  6. Glipizide 5 mg twice daily.  7. Lantus 32 units each morning.  8. Benicar/HCT 40/25 mg one tablet daily.  9. Allopurinol 100-mg tablets 1/2 tablet twice daily.  10.Omega fish oil capsules daily.  11.Vitamin B12 daily.  12.Magnesium oxide 400 mg daily.   She has follow-up at [facility], [address] at  the ICD clinic Monday, November, 24, at 9:20.  The patient is asked to  keep her incision dry for the next 7 days, to sponge-bathe in the sink  until Monday November 10.   LABORATORY STUDIES:  Labs were drawn November 3.  Pro time is 12.8, the  INR is 0.9,  Sodium 140, potassium 4.8, chloride 103, carbonate 27,  glucose 171, BUN is 28, creatinine 0.76.  White cells 5.5, hemoglobin  12.3, hematocrit 35.2, platelets of  183.   Once again, greater than 35 minutes.      Maple Mirza, PA      Doylene Canning. Ladona Ridgel, MD  Electronically Signed    GM/MEDQ  D:  11/04/2007  T:  11/05/2007  Job:  [id number]   cc:   Peter M. Swaziland, M.D.  Robert A. Nicholos Johns, M.D.

## 2011-05-18 NOTE — Op Note (Signed)
   Andrea [name], Andrea [name]                            ACCOUNT NO.:  [id number]   MEDICAL RECORD NO.:  [id number]                   PATIENT TYPE:  AMB   LOCATION:  ENDO                                 FACILITY:  [facility]   PHYSICIAN:  James L. Malon Kindle., M.D.          DATE OF BIRTH:  1951-03-27   DATE OF PROCEDURE:  12/18/2002  DATE OF DISCHARGE:  12/18/2002                                 OPERATIVE REPORT   PROCEDURE:  Colonoscopy.   MEDICATIONS:  Fentanyl 75 mcg, Versed 8 mg IV.   INDICATIONS:  Rectal bleeding.   ENDOSCOPE:  Olympus adult colonoscope.   DESCRIPTION OF PROCEDURE:  The procedure had been explained to the patient  and consent obtained.  With the patient in the left lateral decubitus  position, the Olympus scope was inserted and advanced under direct  visualization.  The adult scope was used.  We were able to advance easily to  the cecum.  The ileocecal valve and appendiceal orifice were seen.  The  scope withdrawn and the cecum, ascending colon, hepatic flexure, transverse  colon, splenic flexure, descending, and sigmoid colon were seen well with no  polyps or other lesions.  No significant diverticular disease.  The scope  was withdrawn in the rectum.  The patient did have rather large internal  hemorrhoids.  No other findings in the rectum.  The scope was withdrawn.  The patient tolerated the procedure well.   ASSESSMENT:  Rectal bleeding, probably due to internal hemorrhoids.   PLAN:  Will give a hemorrhoid instruction sheet and see back in the office  in six months.                                               James L. Malon Kindle., M.D.    Waldron Session  D:  12/18/2002  T:  12/20/2002  Job:  [id number]   cc:   Pershing Cox, M.D.  [address]  [address]  [place]  [place] [address]  Fax: [phone]   Elana Alm. Nicholos Johns, M.D.  [address]  [place]  [place] [address]  Fax: [phone]

## 2011-05-18 NOTE — Cardiovascular Report (Signed)
[facility]  Patient:    Andrea [name], Andrea [name]                         MRN: [id number] Proc. Date: 05/09/00 Adm. Date:  [id number] Disc. Date: [id number] Attending:  Meade Maw A CC:         Vikki Ports, M.D.                        Cardiac Catheterization  REFERRING PHYSICIAN:  Vikki Ports, M.D.  INDICATIONS:  The patient is a 60 year old female, who presented to the office with a chief complaint of chest pain which started approximately two weeks ago.  The pain predictably occurred with exercise and resolved with stress.  She had significant shortness of breath and pedal edema and it was elected to proceed with left heart catheterization based on significant risks factors of diabetes, hypertension, family history, and typical history.  DESCRIPTION OF PROCEDURE:  After obtaining written informed consent, the patient was brought to the cardiac catheterization lab.  Preop sedation was achieved with IV Versed and IV fentanyl. Loss of consciousness  The right groin was prepped and draped in the usual sterile fashion.  Local anesthesia was achieved using 1% Xylocaine.  A 6 French hemostasis sheath was placed into the right femoral artery using a modified Seldinger technique.  Selective coronary angiography was performed using a JL4, JR4 Judkins catheter. Nonionic contrast was used and was hand injection.  Single plane ventriculogram was performed in the RAO position using a 6 French pigtail curved catheter.  All catheter exchanges were made over a guidewire. Hemostasis sheath was flushed following each injection.  FINDINGS:  The aortic pressure is 116/72, LV pressure is 115/14.  Single plane ventriculogram revealed diffuse cardiomyopathy.  The ejection fraction was approximately 45%.  There was no significant mitral regurgitation noted.  CORONARY ANGIOGRAPHY:  Left main coronary artery:  The left main coronary artery bifurcated into the left  anterior descending and circumflex vessel. There was no significant disease in the left main coronary artery.  Left anterior descending:  The left anterior descending gave rise to a small diagonal #1, moderate sized bifurcating diagonal #2 and a small diagonal #3 and ended as an apical recurrent branch.  There was no significant disease in the left anterior descending or its branches.  Circumflex vessel:  The circumflex vessel gave rise to a moderate OM-1, ended as an AV groove vessel.  There is no significant disease in the circumflex vessel.  Right coronary artery:  The right coronary artery was dominant.  It gave rise to a small RV marginal #1, large PDA.  There was no significant disease in the right coronary artery.  IMPRESSION: 1. Normal coronary artery. 2. Cardiomyopathy, most likely secondary to diabetes and hypertension.  RECOMMENDATIONS:  Is to continue Ace, beta blocker and symptomatic relief. DD:  05/09/00 TD:  05/10/00 Job: [id number]

## 2011-05-18 NOTE — H&P (Signed)
[place]. [facility]  Patient:    Andrea [name], Andrea [name] Visit Number: [id number] MRN: [id number]          Service Type: MED Location: [id number] Attending Physician:  Janifer Adie Dictated by:   Arvella Merles, M.D. Admit Date:  09/04/2001                           History and Physical  CHIEF COMPLAINT:  Chest pain and shortness of breath.  HISTORY OF PRESENT ILLNESS:  A 60-year-old black female presents to [facility] Emergency Room from the walk-in clinic tonight complaining of a three-week history of gradually worsening dyspnea on exertion and exertional chest pressure.  It is relieved by rest and exacerbated by stress.  She feels it started after she was exposed to some dry ice fumes in a night club three weeks ago.  She has had no orthopnea or PND, but over the last three to four days has noted increased pedal edema and increased dyspnea.  She had somewhat similar symptoms in May 2001.  At that time, she had a cardiac catheterization showing cardiomyopathy with ejection fraction of 45% but essentially normal coronary arteries.  Her chest pain was 7/10 in intensity initially.  After nitroglycerin drip was begun, it dropped to 2/10 and now is 0/10.  She does admit to GE reflux and uses occasional Pepcid over-the-counter.  PAST MEDICAL HISTORY:   Type 2 diabetes for approximately 8 years, hypertension, hypercholesterolemia, history of asthma, but she has been asymptomatic in recent years.  History of hypophosphatemic rickets/osteomalacia in 1994 of questionable etiology.  She apparently had a stress fracture in the left fifth metatarsal which initially triggered this investigation.  She also is intolerant but eats dairy products and gets adequate sunlight.   History of tonsillectomy, history of right oophorectomy secondary to a cyst, history of cervical polyp, history of gastritis and positive H. pylori which was treated by her  report.  MEDICATIONS:  The patient is unclear of the dosages but takes Hyzaar, GlucoVance in the morning, Glucophage in the evening, and lipid-lowering medication which she thinks is generic Mevacor.  ALLERGIES:  No known drug allergies, but she has had nausea and vomiting with DARVON and PERCOCET.  FAMILY HISTORY:  Mother died of MI shortly after an ER evaluation for chest pain in which she had a normal EKG.  Father had CHF.  SOCIAL HISTORY:  No tobacco and no alcohol.  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  REVIEW OF SYSTEMS:  She does get headaches and neck pain chronically, occasional sinus congestion.  She denies fever, sweats, chills, cough, chest congestion, nausea, vomiting, diarrhea, constipation, and urinary symptoms.  PHYSICAL EXAMINATION:  VITAL SIGNS:  Temperature 98.6, blood pressure 128/79, pulse 104, respirations 18, O2 saturation 99% on room air.  GENERAL:  Well-developed, well-nourished, in no acute distress.  HEENT:  Normocephalic, atraumatic.  Pupils are equal, round and reactive to light.  Fundi normal.  Mouth and oropharynx normal.  TMs normal.  NECK:  Supple, nontender, without adenopathy.  LUNGS:  Clear.  HEART:  Regular rate and rhythm without murmur, gallop, or rub.  ABDOMEN:  Bowel sounds soft and nontender throughout, no masses.  EXTREMITIES:  Show 2+ ankle edema.  DTRs 2+ and symmetrical.  Muscle strength is 5/5.  GU:  Deferred to Dr. Carey Bullocks.  LABORATORY DATA:  White count 6.6, 43 neutrophils, 47 lymphs.  Hemoglobin  12.5, hematocrit 36.1, platelets 250,000.  Pro time and PTT normal.  Sodium 138, potassium 3.8, chloride 99, CO2 28, BUN 12, creatinine 0.6, glucose 175. AST 29, ALT 20.  EKG shows nonspecific ST-T changes.  Chest x-ray shows poor inspiration with question of some mild vascular congestion, although radiologist feels it is essentially normal.  IMPRESSION:  Chest pain and shortness of breath.  She had a  normal catheterization just over a year ago but with diabetes and other risks factors, need to rule out myocardial infarction and rule out mild congestive heart failure.  PLAN:  Serial isoenzymes and EKG.  Give Lasix 40 mg IV tonight and check chest x-ray tomorrow.  Continue nitroglycerin drip overnight, sliding scale insulin, and will obtain dosages of medications from her chart at her primary care physicians office tomorrow. Dictated by:   Arvella Merles, M.D. Attending Physician:  Nolene Ebbs Iv DD:  09/05/01 TD:  09/05/01 Job: [id number]

## 2011-05-18 NOTE — Assessment & Plan Note (Signed)
[facility]                         GASTROENTEROLOGY OFFICE NOTE   EMERLYN, MEHLHOFF                         MRN:          [id number]  DATE:01/21/2007                            DOB:          [birth year]    CHIEF COMPLAINT:  Followup of nausea.   [name] has an upper which showed some nonerosive gastritis and  duodenitis.  I had her do a gastric emptying study and that was normal.  She emptied all but 1% by 2 hours.  She is not taking Prevacid anymore,  nor is she taking AcipHex.  Unfortunately, she continues to have a lot  of postprandial nausea.  When she goes to bed at night she does take  ranitidine to prevent acid reflux at night and that works.  She is not  nauseous at that time, nor is she nauseous when she first wakes up, but  then in the morning she starts to feel nauseous and she says she eats to  feel better, but then that promotes more nausea.  She has not vomited  and she is not having epigastric pain anymore.  Her medications are  listed and reviewed in the chart.  Her blood sugars are 180 and less,  she says.   Past medical history reviewed and unchanged from December 11, 2006, plus  as above.   Medications listed reviewed in the chart.  She has started Coreg in the  time being, though her symptoms predate that.   Physical reveals obese, pleasant black woman.  Weight 222 pounds, pulse  60 and regular, blood pressure 98/70.  Abdomen is obese.  Eyes  anicteric.   ASSESSMENT:  Persistent nausea, mainly postprandial.  Etiology not  clear, endoscopy shows mild nonerosive gastritis and duodenitis which  may or may not be linked to her symptoms.  There are no other worrisome  features at this time.  Her gastric emptying study showed near complete  emptying of the stomach.  Gastric motility disturbance, i.e. rapid  emptying, may be part of the problem though she has had some early  satiety symptoms.  She has persistent nausea.   PLAN:   Trial of Levbid.  It that is unhelpful, will need to consider  other treatment.  She did have an ultrasound to look at the gallbladder  at [facility], she tells me, and that was okay.  We will try  to get a copy of that report.   She is to contact me for followup if she has persistent symptoms and we  can determine what the next evaluation step or therapeutic option would  be.  She understands that there may be some trial and error regarding  different therapies.  Another PPI therapy such as Zegerid is a  possibility, though she really has not responded to two other PPIs I  have tried this approach.  Metoclopramide is a possible option but side effects in the normal to  rapid emptying go against that.  Buspirone might be useful for her  functional dyspepsia.     Andrea Boop, MD,FACG  Electronically Signed  CEG/MedQ  DD: 01/21/2007  DT: 01/21/2007  Job #: [id number]   cc:   Andrea [name], M.D.

## 2011-05-18 NOTE — Assessment & Plan Note (Signed)
[facility]                         GASTROENTEROLOGY OFFICE NOTE   Andrea [name], Andrea [name]                         MRN:          [id number]  DATE:12/11/2006                            DOB:          05-Jan-1951    REFERRING PHYSICIAN:  Elana Alm. Nicholos Johns, M.D.   REASON FOR CONSULTATION:  Nausea, reflux, epigastric pain, vomiting.   ASSESSMENT:  A 60 year old African-American woman with longstanding type  2 diabetes mellitus.  She has been having several weeks of epigastric  pain, postprandial vomiting anywhere from minutes to 1/2 hour after a  meal.  There is heartburn and regurgitation.  It sounds like her blood  sugars are not completely controlled.   RECOMMENDATIONS AND PLAN:  1. Schedule upper GI endoscopy to rule out gastric outlet obstruction,      ulcer disease, malignancy.  She also has early satiety.  2. I suspect it is probably gastroparesis.  We may need gastric      emptying study depending upon the results of the EGD.   Risks, benefits, indications of the procedure explained to the patient.  She has agreed to proceed.   HISTORY:  A 60 year old black woman with problems as described above.  This has been going on for several weeks; it is waking her up at night,  and she cannot sleep.   REVIEW OF SYSTEMS:  GI Review of Systems otherwise negative.   MEDICATIONS:  1. Glyburide 5 mg twice daily.  2. Benicar 40 mg daily.  3. TriCor 48 mg daily.  4. Avandia 4 mg b.i.d.  5. Crestor 40 mg daily.  6. Tessalon Perles p.r.n.  7. Toprol 50 mg daily.  8. Ranitidine 150 mg nightly.  9. Prevacid 30 mg daily.  10.Klor-Con twice daily.  11.Furosemide 40 mg b.i.d.  12.Aciphex is also used p.r.n. at times 20 mg.   DRUG ALLERGIES:  ASPIRIN, DARVOCET, DEMEROL.   PAST MEDICAL HISTORY:  1. Diabetes mellitus, type 2.  2. Obesity.  3. Dyslipidemia.  4. Hypophosphatemic rickets.  5. 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  6. Osteoarthritis.  7. Chronic headaches.  8. Hypertension.  9. Prior appendectomy.  10.She had a colonoscopy in 2003 showing hemorrhoids (Dr. Randa Evens).   FAMILY HISTORY:  Heart disease in her parents.  No colon cancer  reported.   SOCIAL HISTORY:  One daughter.  Retired from [employer].  No alcohol,  tobacco, or drugs.  She is living with her partner at this time.   REVIEW OF SYSTEMS:  See History of Present Illness for full details.  There is some cough.   PHYSICAL EXAMINATION:  GENERAL:  Obese black woman in no acute distress.  VITAL SIGNS:  Height 5 feet 1 inch, weight 226 pounds.  Blood pressure  140/90, pulse 84.  HEENT:  Eyes anicteric.  She is missing some teeth.  NECK:  Supple.  No thyromegaly or mass.  CHEST: Clear.  HEART: S1, S2. No gallops.  ABDOMEN: Shows no succussion splash.  Tender in the epigastrium.  Obese  without organomegaly or mass otherwise.  LYMPHATICS:  No neck or supraclavicular nodes.  EXTREMITIES:  No edema.  SKIN:  No rash.  PSYCH:  Alert and oriented x3.   I appreciate the opportunity to care for this patient.     Iva Boop, MD,FACG  Electronically Signed    CEG/MedQ  DD: 12/11/2006  DT: 12/11/2006  Job #: [id number]   cc:   Elana Alm. Nicholos Johns, M.D.

## 2011-05-18 NOTE — Discharge Summary (Signed)
[place]. [facility]  Patient:    Andrea [name], Andrea [name] Visit Number: [id number] MRN: [id number]          Service Type: MED Location: [phone] Attending Physician:  Janifer Adie Dictated by:   Selina Cooley, M.D. Admit Date:  09/04/2001 Discharge Date: 09/06/2001   CC:         Molly Maduro A. Eliezer Lofts., M.D., [facility]  Meade Maw, M.D., [facility]   Discharge Summary  DISCHARGE DIAGNOSES: 1. Chest pain/pressure.  The patient ruled out for myocardial infarction,    suspect underlying anxiety precipitating symptoms.  Recommend outpatient    cardiology follow-up, given multiple cardiac risks.  Cardiac    catheterization in May 2001 revealed normal coronary arteries by    Dr. Candace Cruise and an ejection fraction of 45%. 2. Non-ischemic cardiomyopathy, ejection fraction of 45% by catheterization,    as noted above.  No evidence of decompensated heart failure this admission    by exam.  Also, an natriuretic peptide was low at 15; would continue ACE    inhibitor or angiotensin II receptor blocker.  The patient is on Hyzaar;    would also consider low-dose beta-blocker for long-term morbidity and    mortality reduction. 3. Diabetes mellitus type 2 on oral medications. 4. Hypertension. 5. Dyslipidemia. 6. Obesity. 7. Suspected anxiety with multiple social stressors. 8. Possible gastroesophageal reflux disease.  DISCHARGE MEDICATIONS:  The patient is to resume prior medications including: 1. Hyzaar p.o. q.d. 2. Mevacor p.o. q.d. 3. Glucovance p.o. q.d. 4. Protonix 40 mg p.o. q.d. (new). 5. Enteric-coated aspirin 81 mg p.o. q.d. (new).  DIET:  The patient was advised to follow a diabetic diet, ADA 2000 kilocalorie 2 g sodium restricted diet.  FOLLOW-UP:  Follow up with Dr. Nicholos Johns within 1-2 weeks; would also recommend follow-up with Dr. Fraser Din of cardiology for evaluation of her cardiomyopathy, as well as possible  reevaluation of her chest pressure, given multiple cardiac risk factors; would recommend starting beta-blocker and considering discontinuing Glucophage, depending on her heart function.  REASON FOR ADMISSION:  The patient is a 60 year old African-American female admitted via [facility] for three weeks of progressive dyspnea on exertion and exertional chest pressure.  The patient described her symptoms exacerbated by stress and occasionally, however not consistently, exacerbated by exertion.  The patient noted increase in lower extremity edema.  HOSPITAL COURSE: #1 - CHEST PAIN/PRESSURE:  The patient has multiple cardiac risk factors including diabetes mellitus, hypertension, family history, obesity, dyslipidemia.  The patient had similar symptoms a year ago and underwent coronary angiography by Dr. Fraser Din revealing a cardiomyopathy with an EF of 45% and normal coronary arteries.  The patient ruled out for MI by both EKG and enzymes x 3 this admission.  She was initially placed on a nitroglycerin drip.  This was discontinued without any increase in pain, pressure, or shortness of breath.  Prior to discharge, she was asked to ambulate in the hallway and she felt at baseline with slight dyspnea when walking and talking at the same time; would recommend that the patient follow up with Dr. Fraser Din for both her cardiomyopathy, to make sure her left ventricular function has not deteriorated since her last evaluation, and also to consider repeat risk stratification/angiogram if her symptoms persist or worsen.  After talking with the patient, it was felt that there was a large component of stress and anxiety associated with her symptoms, specifically the shortness of breath and chest heaviness.  She described multiple social stressors related to her employment and financial concerns, which have worsened over the last three weeks.  She was counseled regarding techniques for regulated  slow breathing and anxiolytics were suggested as a potential therapy in the future; however, we both deferred that approach at this time.  #2 - NON-ISCHEMIC CARDIOMYOPATHY, PROBABLY HYPERTENSIVE CARDIOMYOPATHY:  The patient was continued on Hyzaar at a dose of 100/25.  Her home dose was unknown.  The patient was recommended to continue her usual dose; would also recommend low-dose beta-blocker and baby aspirin for cardioprotective effects.  #3 - DIABETES MELLITUS:  Hemoglobin A1c was ordered.  Results are pending at the time of dictation.  The patient will continue on her usual regimen including oral hypoglycemics, doses unknown; would caution the primary care Isa Hitz to consider discontinuing Glucophage if there is evidence of heart failure or renal dysfunction.  At this point, Glucophage seems to be a reasonable part of her regimen.  #4 - DYSLIPIDEMIA:  The patient was recently started on a statin.  Lipids were ordered and pending at the time of discharge.  #5 - HYPERTENSION:  Evidence of good control with blood pressures less than the target of 130/80.  #6 - OBESITY:  The patient seems motivated to alter her diet for both cholesterol management and weight control and seems to be knowledgeable about how to do this and also motivated to resume an exercise regimen. Dictated by:   Selina Cooley, M.D. Attending Physician:  Nolene Ebbs Iv DD:  09/06/01 TD:  09/07/01 Job: [id number]

## 2011-05-21 ENCOUNTER — Encounter (HOSPITAL_COMMUNITY): Payer: BC Managed Care – PPO | Attending: Cardiology

## 2011-05-21 ENCOUNTER — Telehealth: Payer: Self-pay | Admitting: Cardiology

## 2011-05-21 ENCOUNTER — Other Ambulatory Visit: Payer: Self-pay | Admitting: Cardiology

## 2011-05-21 DIAGNOSIS — I10 Essential (primary) hypertension: Secondary | ICD-10-CM | POA: Insufficient documentation

## 2011-05-21 DIAGNOSIS — Z95810 Presence of automatic (implantable) cardiac defibrillator: Secondary | ICD-10-CM | POA: Insufficient documentation

## 2011-05-21 DIAGNOSIS — I2510 Atherosclerotic heart disease of native coronary artery without angina pectoris: Secondary | ICD-10-CM | POA: Insufficient documentation

## 2011-05-21 DIAGNOSIS — Z8673 Personal history of transient ischemic attack (TIA), and cerebral infarction without residual deficits: Secondary | ICD-10-CM | POA: Insufficient documentation

## 2011-05-21 DIAGNOSIS — Z5189 Encounter for other specified aftercare: Secondary | ICD-10-CM | POA: Insufficient documentation

## 2011-05-21 DIAGNOSIS — Z794 Long term (current) use of insulin: Secondary | ICD-10-CM | POA: Insufficient documentation

## 2011-05-21 DIAGNOSIS — I2789 Other specified pulmonary heart diseases: Secondary | ICD-10-CM | POA: Insufficient documentation

## 2011-05-21 DIAGNOSIS — E785 Hyperlipidemia, unspecified: Secondary | ICD-10-CM | POA: Insufficient documentation

## 2011-05-21 DIAGNOSIS — I509 Heart failure, unspecified: Secondary | ICD-10-CM | POA: Insufficient documentation

## 2011-05-21 DIAGNOSIS — I5022 Chronic systolic (congestive) heart failure: Secondary | ICD-10-CM | POA: Insufficient documentation

## 2011-05-21 DIAGNOSIS — E119 Type 2 diabetes mellitus without complications: Secondary | ICD-10-CM | POA: Insufficient documentation

## 2011-05-21 DIAGNOSIS — E669 Obesity, unspecified: Secondary | ICD-10-CM | POA: Insufficient documentation

## 2011-05-21 DIAGNOSIS — Z9861 Coronary angioplasty status: Secondary | ICD-10-CM | POA: Insufficient documentation

## 2011-05-21 DIAGNOSIS — I428 Other cardiomyopathies: Secondary | ICD-10-CM | POA: Insufficient documentation

## 2011-05-21 DIAGNOSIS — I214 Non-ST elevation (NSTEMI) myocardial infarction: Secondary | ICD-10-CM | POA: Insufficient documentation

## 2011-05-21 LAB — GLUCOSE, CAPILLARY: Glucose-Capillary: 188 mg/dL — ABNORMAL HIGH (ref 70–99)

## 2011-05-21 NOTE — Telephone Encounter (Signed)
Fax: [phone] Last post hosp OV, EKG

## 2011-05-23 ENCOUNTER — Telehealth: Payer: Self-pay | Admitting: *Deleted

## 2011-05-23 ENCOUNTER — Encounter (HOSPITAL_COMMUNITY): Payer: BC Managed Care – PPO

## 2011-05-23 NOTE — Telephone Encounter (Signed)
Message copied by Murrell Redden on Wed May 23, 2011  9:54 AM ------      Message from: [place], PETER      Created: Mon May 21, 2011  4:27 PM       Not sure why we're getting her glucose readings. Her diabetes is followed by Dr. Nicholos Johns.

## 2011-05-23 NOTE — Telephone Encounter (Signed)
Notified of lab results. 

## 2011-05-25 ENCOUNTER — Encounter (HOSPITAL_COMMUNITY): Payer: BC Managed Care – PPO

## 2011-05-25 ENCOUNTER — Other Ambulatory Visit: Payer: Self-pay | Admitting: Cardiology

## 2011-05-28 ENCOUNTER — Encounter (HOSPITAL_COMMUNITY): Payer: BC Managed Care – PPO

## 2011-05-29 ENCOUNTER — Telehealth: Payer: Self-pay | Admitting: *Deleted

## 2011-05-29 NOTE — Telephone Encounter (Signed)
Message copied by Lorayne Bender on Tue May 29, 2011  1:37 PM ------      Message from: [place], PETER M      Created: Fri May 25, 2011  4:47 PM       Sugar still high. Needs f/u with primary MD.

## 2011-05-29 NOTE — Telephone Encounter (Signed)
Notified of lab results.States cardiac rehab is ordering sugar. Advised to tell them to send to Dr. Nicholos Johns. She hasn't gotten in touch with him yet.

## 2011-05-30 ENCOUNTER — Encounter (HOSPITAL_COMMUNITY): Payer: BC Managed Care – PPO

## 2011-06-01 ENCOUNTER — Encounter (HOSPITAL_COMMUNITY): Payer: BC Managed Care – PPO

## 2011-06-04 ENCOUNTER — Encounter (HOSPITAL_COMMUNITY): Payer: BC Managed Care – PPO | Attending: Cardiology

## 2011-06-04 ENCOUNTER — Other Ambulatory Visit: Payer: Self-pay | Admitting: Cardiology

## 2011-06-04 DIAGNOSIS — E669 Obesity, unspecified: Secondary | ICD-10-CM | POA: Insufficient documentation

## 2011-06-04 DIAGNOSIS — Z8673 Personal history of transient ischemic attack (TIA), and cerebral infarction without residual deficits: Secondary | ICD-10-CM | POA: Insufficient documentation

## 2011-06-04 DIAGNOSIS — Z95810 Presence of automatic (implantable) cardiac defibrillator: Secondary | ICD-10-CM | POA: Insufficient documentation

## 2011-06-04 DIAGNOSIS — I10 Essential (primary) hypertension: Secondary | ICD-10-CM | POA: Insufficient documentation

## 2011-06-04 DIAGNOSIS — I2789 Other specified pulmonary heart diseases: Secondary | ICD-10-CM | POA: Insufficient documentation

## 2011-06-04 DIAGNOSIS — Z794 Long term (current) use of insulin: Secondary | ICD-10-CM | POA: Insufficient documentation

## 2011-06-04 DIAGNOSIS — I2510 Atherosclerotic heart disease of native coronary artery without angina pectoris: Secondary | ICD-10-CM | POA: Insufficient documentation

## 2011-06-04 DIAGNOSIS — I428 Other cardiomyopathies: Secondary | ICD-10-CM | POA: Insufficient documentation

## 2011-06-04 DIAGNOSIS — I5022 Chronic systolic (congestive) heart failure: Secondary | ICD-10-CM | POA: Insufficient documentation

## 2011-06-04 DIAGNOSIS — Z9861 Coronary angioplasty status: Secondary | ICD-10-CM | POA: Insufficient documentation

## 2011-06-04 DIAGNOSIS — Z5189 Encounter for other specified aftercare: Secondary | ICD-10-CM | POA: Insufficient documentation

## 2011-06-04 DIAGNOSIS — E119 Type 2 diabetes mellitus without complications: Secondary | ICD-10-CM | POA: Insufficient documentation

## 2011-06-04 DIAGNOSIS — I214 Non-ST elevation (NSTEMI) myocardial infarction: Secondary | ICD-10-CM | POA: Insufficient documentation

## 2011-06-04 DIAGNOSIS — E785 Hyperlipidemia, unspecified: Secondary | ICD-10-CM | POA: Insufficient documentation

## 2011-06-04 DIAGNOSIS — I509 Heart failure, unspecified: Secondary | ICD-10-CM | POA: Insufficient documentation

## 2011-06-04 LAB — GLUCOSE, CAPILLARY: Glucose-Capillary: 224 mg/dL — ABNORMAL HIGH (ref 70–99)

## 2011-06-06 ENCOUNTER — Encounter (HOSPITAL_COMMUNITY): Payer: BC Managed Care – PPO

## 2011-06-08 ENCOUNTER — Encounter (HOSPITAL_COMMUNITY): Payer: BC Managed Care – PPO

## 2011-06-11 ENCOUNTER — Encounter (HOSPITAL_COMMUNITY): Payer: BC Managed Care – PPO

## 2011-06-13 ENCOUNTER — Encounter (HOSPITAL_COMMUNITY): Payer: BC Managed Care – PPO

## 2011-06-13 ENCOUNTER — Other Ambulatory Visit: Payer: Self-pay | Admitting: Cardiology

## 2011-06-13 LAB — GLUCOSE, CAPILLARY: Glucose-Capillary: 150 mg/dL — ABNORMAL HIGH (ref 70–99)

## 2011-06-15 ENCOUNTER — Other Ambulatory Visit: Payer: Self-pay | Admitting: Cardiology

## 2011-06-15 ENCOUNTER — Encounter (HOSPITAL_COMMUNITY): Payer: BC Managed Care – PPO

## 2011-06-15 LAB — GLUCOSE, CAPILLARY
Glucose-Capillary: 264 mg/dL — ABNORMAL HIGH (ref 70–99)
Glucose-Capillary: 197 mg/dL — ABNORMAL HIGH (ref 70–99)

## 2011-06-18 ENCOUNTER — Encounter (HOSPITAL_COMMUNITY): Payer: BC Managed Care – PPO

## 2011-06-18 ENCOUNTER — Other Ambulatory Visit: Payer: Self-pay | Admitting: Cardiology

## 2011-06-18 LAB — GLUCOSE, CAPILLARY: Glucose-Capillary: 145 mg/dL — ABNORMAL HIGH (ref 70–99)

## 2011-06-20 ENCOUNTER — Encounter (HOSPITAL_COMMUNITY): Payer: BC Managed Care – PPO

## 2011-06-20 ENCOUNTER — Other Ambulatory Visit: Payer: Self-pay | Admitting: Cardiology

## 2011-06-22 ENCOUNTER — Encounter (HOSPITAL_COMMUNITY): Payer: BC Managed Care – PPO

## 2011-06-25 ENCOUNTER — Emergency Department (HOSPITAL_COMMUNITY): Payer: BC Managed Care – PPO

## 2011-06-25 ENCOUNTER — Encounter (HOSPITAL_COMMUNITY): Payer: BC Managed Care – PPO

## 2011-06-25 ENCOUNTER — Inpatient Hospital Stay (HOSPITAL_COMMUNITY)
Admission: EM | Admit: 2011-06-25 | Discharge: 2011-06-26 | DRG: 543 | Disposition: A | Discharge status: Home / Self Care | Payer: BC Managed Care – PPO | Attending: Cardiology | Admitting: Cardiology

## 2011-06-25 DIAGNOSIS — I5022 Chronic systolic (congestive) heart failure: Secondary | ICD-10-CM | POA: Diagnosis present

## 2011-06-25 DIAGNOSIS — IMO0002 Reserved for concepts with insufficient information to code with codable children: Secondary | ICD-10-CM | POA: Diagnosis present

## 2011-06-25 DIAGNOSIS — Z794 Long term (current) use of insulin: Secondary | ICD-10-CM

## 2011-06-25 DIAGNOSIS — Z95810 Presence of automatic (implantable) cardiac defibrillator: Secondary | ICD-10-CM

## 2011-06-25 DIAGNOSIS — I2510 Atherosclerotic heart disease of native coronary artery without angina pectoris: Secondary | ICD-10-CM | POA: Diagnosis present

## 2011-06-25 DIAGNOSIS — Z79899 Other long term (current) drug therapy: Secondary | ICD-10-CM

## 2011-06-25 DIAGNOSIS — Z7902 Long term (current) use of antithrombotics/antiplatelets: Secondary | ICD-10-CM

## 2011-06-25 DIAGNOSIS — I2789 Other specified pulmonary heart diseases: Secondary | ICD-10-CM | POA: Diagnosis present

## 2011-06-25 DIAGNOSIS — K219 Gastro-esophageal reflux disease without esophagitis: Secondary | ICD-10-CM | POA: Diagnosis present

## 2011-06-25 DIAGNOSIS — E119 Type 2 diabetes mellitus without complications: Secondary | ICD-10-CM | POA: Diagnosis present

## 2011-06-25 DIAGNOSIS — F3289 Other specified depressive episodes: Secondary | ICD-10-CM | POA: Diagnosis present

## 2011-06-25 DIAGNOSIS — Z9861 Coronary angioplasty status: Secondary | ICD-10-CM

## 2011-06-25 DIAGNOSIS — Z8673 Personal history of transient ischemic attack (TIA), and cerebral infarction without residual deficits: Secondary | ICD-10-CM

## 2011-06-25 DIAGNOSIS — F329 Major depressive disorder, single episode, unspecified: Secondary | ICD-10-CM | POA: Diagnosis present

## 2011-06-25 DIAGNOSIS — I2589 Other forms of chronic ischemic heart disease: Secondary | ICD-10-CM | POA: Diagnosis present

## 2011-06-25 DIAGNOSIS — I252 Old myocardial infarction: Secondary | ICD-10-CM

## 2011-06-25 DIAGNOSIS — E785 Hyperlipidemia, unspecified: Secondary | ICD-10-CM | POA: Diagnosis present

## 2011-06-25 DIAGNOSIS — M109 Gout, unspecified: Secondary | ICD-10-CM | POA: Diagnosis present

## 2011-06-25 DIAGNOSIS — I509 Heart failure, unspecified: Secondary | ICD-10-CM | POA: Diagnosis present

## 2011-06-25 DIAGNOSIS — R0789 Other chest pain: Principal | ICD-10-CM | POA: Diagnosis present

## 2011-06-25 DIAGNOSIS — E669 Obesity, unspecified: Secondary | ICD-10-CM | POA: Diagnosis present

## 2011-06-25 DIAGNOSIS — R079 Chest pain, unspecified: Secondary | ICD-10-CM

## 2011-06-25 DIAGNOSIS — I10 Essential (primary) hypertension: Secondary | ICD-10-CM | POA: Diagnosis present

## 2011-06-25 LAB — BASIC METABOLIC PANEL
BUN: 25 mg/dL — ABNORMAL HIGH (ref 6–23)
CO2: 31 mEq/L (ref 19–32)
Calcium: 9.8 mg/dL (ref 8.4–10.5)
Creatinine, Ser: 0.94 mg/dL (ref 0.50–1.10)
Glucose, Bld: 214 mg/dL — ABNORMAL HIGH (ref 70–99)

## 2011-06-25 LAB — CBC
MCH: 24.5 pg — ABNORMAL LOW (ref 26.0–34.0)
MCHC: 34.1 g/dL (ref 30.0–36.0)
Platelets: 162 10*3/uL (ref 150–400)
RBC: 5.1 MIL/uL (ref 3.87–5.11)
RDW: 14.9 % (ref 11.5–15.5)

## 2011-06-25 LAB — DIFFERENTIAL
Basophils Absolute: 0 10*3/uL (ref 0.0–0.1)
Eosinophils Absolute: 0.1 10*3/uL (ref 0.0–0.7)
Lymphocytes Relative: 34 % (ref 12–46)
Monocytes Absolute: 0.3 10*3/uL (ref 0.1–1.0)
Neutro Abs: 2.7 10*3/uL (ref 1.7–7.7)

## 2011-06-25 LAB — CK TOTAL AND CKMB (NOT AT ARMC): Total CK: 102 U/L (ref 7–177)

## 2011-06-25 LAB — APTT: aPTT: 27 seconds (ref 24–37)

## 2011-06-25 LAB — TROPONIN I: Troponin I: <0.3 ng/mL (ref <0.30)

## 2011-06-26 DIAGNOSIS — I059 Rheumatic mitral valve disease, unspecified: Secondary | ICD-10-CM

## 2011-06-26 DIAGNOSIS — I2510 Atherosclerotic heart disease of native coronary artery without angina pectoris: Secondary | ICD-10-CM

## 2011-06-26 LAB — CARDIAC PANEL(CRET KIN+CKTOT+MB+TROPI)
Relative Index: INVALID (ref 0.0–2.5)
Total CK: 81 U/L (ref 7–177)
Total CK: 72 U/L (ref 7–177)
Troponin I: <0.3 ng/mL (ref <0.30)

## 2011-06-26 LAB — PLATELET INHIBITION P2Y12: P2Y12 % Inhibition: 0 %

## 2011-06-26 LAB — GLUCOSE, CAPILLARY
Glucose-Capillary: 146 mg/dL — ABNORMAL HIGH (ref 70–99)
Glucose-Capillary: 149 mg/dL — ABNORMAL HIGH (ref 70–99)

## 2011-06-26 LAB — LIPID PANEL
Cholesterol: 158 mg/dL (ref 0–200)
LDL Cholesterol: 64 mg/dL (ref 0–99)
Triglycerides: 337 mg/dL — ABNORMAL HIGH (ref <150)

## 2011-06-26 LAB — HEMOGLOBIN A1C
Hgb A1c MFr Bld: 7.8 % — ABNORMAL HIGH (ref <5.7)
Mean Plasma Glucose: 177 mg/dL — ABNORMAL HIGH (ref <117)

## 2011-06-26 LAB — TSH: TSH: 0.921 u[IU]/mL (ref 0.350–4.500)

## 2011-06-26 LAB — MRSA PCR SCREENING: MRSA by PCR: NEGATIVE

## 2011-06-27 ENCOUNTER — Encounter (HOSPITAL_COMMUNITY): Payer: BC Managed Care – PPO

## 2011-06-29 ENCOUNTER — Encounter (HOSPITAL_COMMUNITY): Payer: BC Managed Care – PPO

## 2011-07-02 ENCOUNTER — Encounter (HOSPITAL_COMMUNITY): Payer: BC Managed Care – PPO

## 2011-07-02 ENCOUNTER — Encounter: Payer: Self-pay | Admitting: Cardiology

## 2011-07-02 NOTE — Discharge Summary (Addendum)
[name], [name] NO.:  [id number]  MEDICAL RECORD NO.:  [id number]  LOCATION:  6533                         FACILITY:  [facility]  PHYSICIAN:  Peter M. [place], M.D.  DATE OF BIRTH:  1951-11-28  DATE OF ADMISSION:  06/25/2011 DATE OF DISCHARGE:  06/26/2011                              DISCHARGE SUMMARY   DISCHARGE DIAGNOSES: 1. Chest pain.     a.     Negative cardiac enzymes x3.     b.     Cardiac catheterization on June 26, 2011, showing      nonobstructive coronary artery disease with stent to the right      coronary artery patent, for continued medical therapy.     c.     Difficult radial case, groin access recommended for future      catheterization. 2. Inadequate P2Y12 platelet inhibition with Plavix, loaded and     switched to Brilinta this admission.     a.     The patient reported history of gout with Brilinta, but per      Dr. [place]iland will reinitiate another trial. 3. Previously known coronary artery disease.     a.     Non-ST segment myocardial infarction in April 2012, status      post Promus DES to the proximal right coronary artery.     b.     Status post Promus DES to the distal right coronary artery      in December 2011. 4. Ischemic cardiomyopathy with ejection fraction of 25-30% by cath on     June 26, 2011.     a.     Previous ejection fraction of 15% by echocardiogram in 2011.     b.     History of chronic systolic heart failure.     c.     Status post Medtronic ICD in November 2008. 5. Insulin-dependent diabetes mellitus. 6. Hypertension. 7. Hyperlipidemia. 8. Obesity. 9. Pulmonary hypertension. 10.Cerebrovascular accident. 11.Gastroesophageal reflux disease. 12.Degenerative disk disease. 13.Thrombocytopenia. 14.Gout. 15.Depression.  PAST SURGICAL HISTORY:  Tonsillectomy, oophorectomy, foot surgery, ICD implantation and neck surgery.  HOSPITAL COURSE:  [name] is a 60 year old female with a history of CAD, ischemic  cardiomyopathy and insulin-dependent diabetes, who presented to [facility] with complaints of chest pain, described as a pressure sensation.  Belching did not make a significant difference of her symptoms.  The pain was worse when lying down, better when sitting upwards and when leaning forward, and there was no exertional component.  Sublingual nitroglycerin temporarily decreased her pain from a 5-2.  GI cocktail was given without relief.  She was seen by the Cardiology Service in the ER and admitted to the hospital. Cardiac enzymes were cycled which were negative x3.  P2Y12 testing was obtained which demonstrated 0% platelet inhibition and a PRU of 317. She underwent cardiac catheterization to further define her anatomy which showed a nonobstructive CAD with stent to the RCA patent.  Full dictated report to follow.  Dr. [place] recommended continued medical therapy.  Unfortunately, she cannot take Effient secondary to prior stroke, so Plavix was discontinued.  The patient thought Brilinta may have caused her gout  in the past, but at this time, Dr. [place] feels that it is very important to try current a trial again.  He also recommends in the future avoiding radial catheterization and move of a threshold groin catheterization may be more appropriate as her radial access was quite difficult.  The patient was observed postprocedurally and did well.  Dr. [place]iland has seen and examined her today and felt she is stable for discharge.  DISCHARGE LABS:  WBC 4.7, hemoglobin 12.5, hematocrit 36.7, platelet count 162.  P2Y12 PRU 317%, function baseline 309%, inhibition 0%. Sodium 140, potassium 3.6, chloride 99, CO2 31, glucose 214, BUN 25, creatinine 0.94, magnesium 1.9.  Cardiac enzymes negative x3.  A1c 7.8, total cholesterol 158, triglycerides 307, HDL 27, LDL 64.  TSH 0.921.  STUDIES: 1. Chest x-ray on June 25, 2011, showed cardiac enlargement and mild     vascular congestion without  overt pulmonary edema. 2. Cardiac catheterization on June 26, 2011,  please see full report     for details as well as HPI for summary.  DISCHARGE MEDICATIONS: 1. Brilinta 90 mg b.i.d. 2. Benicar 20 mg daily. 3. Citracal 2 tablets by mouth daily. 4. Carvedilol 25 mg b.i.d. with meals. 5. Cymbalta 60 mg daily. 6. Dexilant 60 mg every morning as needed for heartburn. 7. Fenofibrate 145 mg daily. 8. Furosemide 40 mg b.i.d. 9. Januvia 100 mg every morning. 10.Lipitor 40 mg nightly. 11.Lantus 10 units subcutaneously every morning per previous sliding     scale. 12.Nitro sublingual 0.4 mg every 5 minutes as needed for chest pain. 13.Uloric 80 mg daily.  Please see above for discussion about Plavix versus Brilinta and Effient.  DISPOSITION:  [name] will be discharged in stable condition to home. She is instructed not to lift anything over 5 pounds or participate in sexual activity for 1 week.  She is not to drive for 2 days.  She should follow a low- sodium heart-healthy diabetic diet, to call or return if she has any pain, swelling, bleeding or pus at her cath site.  She will follow up with Dr. [place] as scheduled and our office will call her to confirm that appointment.  DURATION OF DISCHARGE ENCOUNTER:  Greater than 30 minutes including physician and PA time.     Ronie Spies, P.A.C.   ______________________________ Peter M. [place], M.D.    DD/MEDQ  D:  06/26/2011  T:  06/27/2011  Job:  [id number]  cc:   Molly Maduro A. Nicholos Johns, M.D. Peter M. [place], M.D. Doylene Canning. Ladona Ridgel, MD  Electronically Signed by PETER [place] M.D. on 07/02/2011 09:37:21 AM Electronically Signed by Ronie Spies  on 07/05/2011 01:32:06 PM

## 2011-07-02 NOTE — Cardiovascular Report (Signed)
  Andrea [name], BROOME NO.:  [id number]  MEDICAL RECORD NO.:  [id number]  LOCATION:  6533                         FACILITY:  [facility]  PHYSICIAN:  Peter M. Swaziland, M.D.  DATE OF BIRTH:  [birth year]  DATE OF PROCEDURE:  06/26/2011 DATE OF DISCHARGE:  06/26/2011                           CARDIAC CATHETERIZATION   INDICATIONS FOR PROCEDURE:  A 60 year old black female has a history of coronary artery disease and nonischemic cardiomyopathy.  She presents with prolonged episode of chest pain similar to prior anginal symptoms. She is status post stenting of the distal right coronary artery in January 2012 and of the proximal right coronary artery in April 2012.  PROCEDURES: 1. Left heart catheterization. 2. Coronary and left ventricular angiography.  ACCESS:  Via the right radial artery.  COMPLICATIONS:  There were no complications.  EQUIPMENT:  A 5-French Williams right catheter and a 5-French Jackie left catheter, 5-French pigtail catheter, 5-French arterial sheath.  MEDICATIONS:  Local anesthesia 1% Xylocaine, Versed a total of 3 mg IV, fentanyl 50 mcg IV, heparin 4000 units IV bolus, verapamil 3 mg intra- arterial, contrast 150 mL of Omnipaque.  HEMODYNAMIC DATA:  Aortic pressure was 143/91 with a mean of 114, left ventricle pressure was 143 with EDP of 39 mmHg.  COMMENTARY:  This was a technically very difficult procedure from the radial approach.  It was a very difficult to engage the coronary arteries.  After multiple catheters and extensive time, we were able to engage the right coronary artery with a Williams right catheter and the left coronary artery with a Jackie left catheter.  Limited views of the left system were taken.  FINDINGS:  The right coronary artery arises and distributes normally. It is a very large dominant vessel.  The stents in the proximal and distal vessel were widely patent.  There is diffuse 20-30% disease in the  midvessel.  The left main coronary artery is normal.  The left anterior descending artery demonstrates diffuse 40% narrowing in the midvessel.  The first diagonal has a 30-40% lesion proximally.  The left circumflex coronary has a 20% narrowing proximally.  There is no other significant disease.  Left ventricular angiography performed in the RAO view demonstrates global left ventricular systolic dysfunction with ejection fraction of 25-30%.  FINAL INTERPRETATION: 1. Nonobstructive coronary artery disease.  The stents in the right     coronary artery are widely patent. 2. Severe left ventricular dysfunction.  PLAN:  I would recommend continued medical therapy.  The patient has had platelet inhibition of 0% on Plavix.  She cannot take Effient due to a prior stroke.  We will place her on Brilinta.  I would recommend in future cardiac catheterizations that she could not be done through the right radial approach given the difficulty of her procedure today.          ______________________________ Peter M. [place]iland, M.D.     PMJ/MEDQ  D:  06/26/2011  T:  06/27/2011  Job:  [id number]  cc:   Molly Maduro A. Nicholos Johns, M.D. Doylene Canning. Ladona Ridgel, MD  Electronically Signed by PETER Swaziland M.D. on 07/02/2011 09:37:12 AM

## 2011-07-04 ENCOUNTER — Encounter (HOSPITAL_COMMUNITY): Payer: BC Managed Care – PPO

## 2011-07-05 ENCOUNTER — Other Ambulatory Visit: Payer: BC Managed Care – PPO | Admitting: *Deleted

## 2011-07-05 ENCOUNTER — Ambulatory Visit: Payer: BC Managed Care – PPO | Admitting: Cardiology

## 2011-07-06 ENCOUNTER — Encounter (HOSPITAL_COMMUNITY): Payer: BC Managed Care – PPO

## 2011-07-06 ENCOUNTER — Encounter: Payer: Self-pay | Admitting: Nurse Practitioner

## 2011-07-09 ENCOUNTER — Encounter (HOSPITAL_COMMUNITY): Payer: BC Managed Care – PPO

## 2011-07-10 ENCOUNTER — Ambulatory Visit: Payer: BC Managed Care – PPO | Admitting: Cardiology

## 2011-07-11 ENCOUNTER — Encounter (HOSPITAL_COMMUNITY): Payer: BC Managed Care – PPO

## 2011-07-13 ENCOUNTER — Encounter (HOSPITAL_COMMUNITY): Payer: BC Managed Care – PPO

## 2011-07-16 ENCOUNTER — Encounter (HOSPITAL_COMMUNITY): Payer: BC Managed Care – PPO

## 2011-07-17 ENCOUNTER — Ambulatory Visit (INDEPENDENT_AMBULATORY_CARE_PROVIDER_SITE_OTHER): Payer: BC Managed Care – PPO | Admitting: Cardiology

## 2011-07-17 ENCOUNTER — Encounter: Payer: Self-pay | Admitting: Cardiology

## 2011-07-17 VITALS — BP 102/70 | HR 84 | Ht 61.0 in | Wt 196.8 lb

## 2011-07-17 DIAGNOSIS — I2510 Atherosclerotic heart disease of native coronary artery without angina pectoris: Secondary | ICD-10-CM

## 2011-07-17 DIAGNOSIS — I5022 Chronic systolic (congestive) heart failure: Secondary | ICD-10-CM

## 2011-07-17 MED ORDER — TICAGRELOR 90 MG PO TABS: 90.0000 mg | ORAL_TABLET | Freq: Two times a day (BID) | ORAL | Status: DC

## 2011-07-17 NOTE — Assessment & Plan Note (Signed)
She is on appropriate therapy with angiotensin receptor blockers, carvedilol, and diuretics. She appears to be euvolemic today.

## 2011-07-17 NOTE — Assessment & Plan Note (Signed)
She was switched from Plavix to Brilinta since she was a nonresponder to Plavix by platelet aggregation testing. She is tolerating this well. Her current chest pain sounds more GI in nature and I recommended she follow up with Dr. Randa Evens.

## 2011-07-17 NOTE — Progress Notes (Signed)
Andrea [name] Date of Birth: [birth year]   History of Present Illness: Andrea [name] is seen for followup today. She is status post stenting of the distal right coronary with a 3.5 x 16 mm Promus Element stent on December 15, 2010. She did well initially but began experiencing recurrent chest pain in early April. She had a nuclear stress test which showed evidence of inferior infarction with some period infarct ischemia. She continued to have chest pain he was admitted on April 9 with a small non-ST elevation myocardial infarction. She underwent repeat cardiac catheterization was found to have a high-grade stenosis in the proximal right coronary. This was subtotally stented with a 3.5 x 16 mm Promus Element stent. This was in an area that on previous catheterization had only mild disease. She was readmitted in early July with recurrent chest pain. Her cardiac catheterization at that time showed nonobstructive disease. She has had no complications from her cardiac catheterization. She continues to experience symptoms of chest pain particularly after eating. This is worse when she bends over. She also feels that her heart pounds sometimes after she eats.  Current Outpatient Prescriptions on File Prior to Visit  Medication Sig Dispense Refill  . atorvastatin (LIPITOR) 40 MG tablet Take 1 tablet by mouth at bedtime.      . Calcium Citrate (CITRACAL PO) Take 1 tablet by mouth 2 (two) times daily.        . carvedilol (COREG) 25 MG tablet Take 1 tablet by mouth Twice daily.      Marland Kitchen DEXILANT 60 MG capsule Take 60 mg by mouth as needed.       . DULoxetine (CYMBALTA) 60 MG capsule Take 60 mg by mouth daily.        . febuxostat (ULORIC) 40 MG tablet Take 80 mg by mouth daily.        . fenofibrate (TRICOR) 145 MG tablet Take 1 tablet (145 mg total) by mouth daily.  30 tablet  11  . furosemide (LASIX) 40 MG tablet Take 1 tablet by mouth Twice daily.      . insulin glargine (LANTUS) 100 UNIT/ML injection Inject into  the skin as directed.       Marland Kitchen JANUVIA 100 MG tablet Take 1 tablet by mouth Daily.      . nitroGLYCERIN (NITROSTAT) 0.4 MG SL tablet Place 0.4 mg under the tongue every 5 (five) minutes as needed.        Marland Kitchen olmesartan (BENICAR) 20 MG tablet Take 2 tablets (40 mg total) by mouth daily.  30 tablet  11  . ONE TOUCH ULTRA TEST test strip as directed.      Marland Kitchen acetaminophen (TYLENOL) 500 MG tablet Take 500 mg by mouth as needed.        . Ticagrelor (BRILINTA) 90 MG TABS tablet Take 1 tablet (90 mg total) by mouth 2 (two) times daily.  60 tablet  12    Allergies  Allergen Reactions  . Percocet (Oxycodone-Acetaminophen) Nausea And Vomiting  . Aspirin   . Darvon   . Imitrex (Sumatriptan Base)   . Nsaids   . Propoxyphene Hcl   . Ticagrelor Other (See Comments)    Gout     Past Medical History  Diagnosis Date  . ISCHEMIC HEART DISEASE 02/14/2011  . Chronic systolic heart failure 02/14/2011  . AUTOMATIC IMPLANTABLE CARDIAC DEFIBRILLATOR SITU 02/14/2011  . Hypertension   . Stroke 02/2010    left brain CVA? recurrent TIA's treated with TPA  . Diabetes  mellitus   . Dyslipidemia   . Premature ventricular contractions (PVCs) (VPCs)   . Arthritis   . Migraine headache   . GERD (gastroesophageal reflux disease)   . Coronary artery disease     stenting of right coronary artery. also diagonal stenosis - treated medically  . Hyperlipidemia   . Dilated cardiomyopathy     with an ejection fraction of around 15%  . Pulmonary hypertension     Past Surgical History  Procedure Date  . Cardiac catheterization   . Tonsillectomy   . Oophorectomy     right  . Mass excision     left hip  . Cervical polypectomy     History  Smoking status  . Former Smoker -- 1 years  . Types: Cigarettes  . Quit date: 04/23/1982  Smokeless tobacco  . Never Used    History  Alcohol Use No    Family History  Problem Relation Age of Onset  . Heart failure Mother   . Heart attack Father     Review of  Systems: The review of systems is positive for suboptimal glycemic control.   All other systems were reviewed and are negative.  Physical Exam: BP 102/70  Pulse 84  Ht 5\' 1"  (1.549 m)  Wt 196 lb 12.8 oz (89.268 kg)  BMI 37.19 kg/m2 She is an obese black female in no acute distress. HEENT exam is unremarkable. She has no JVD or bruits. Lungs are clear. Cardiac exam reveals a regular rate and rhythm without gallop, murmur, or click. ICD is in place. Abdomen is obese, soft, nontender. She has no groin hematoma. Pedal pulses are good. She has no edema. LABORATORY DATA:   Assessment / Plan:

## 2011-07-17 NOTE — Patient Instructions (Signed)
I recommend you see Dr. Randa Evens for your GI symptoms.  I will see you in 3 months.  Continue your current medications.

## 2011-07-18 ENCOUNTER — Encounter (HOSPITAL_COMMUNITY): Payer: BC Managed Care – PPO

## 2011-07-19 NOTE — H&P (Signed)
Andrea [name], WAREING NO.:  [id number]  MEDICAL RECORD NO.:  [id number]  LOCATION:  2609                         FACILITY:  [facility]  PHYSICIAN:  Chinelo Benn M. [place], M.D.  DATE OF BIRTH:  1951/11/19  DATE OF ADMISSION:  06/25/2011 DATE OF DISCHARGE:                             HISTORY & PHYSICAL   PRIMARY CARE PHYSICIAN:  Molly Maduro A. Nicholos Johns, MD  PRIMARY CARDIOLOGIST:  Shelah Heatley M. [place], MD  ELECTROPHYSIOLOGIST:  Doylene Canning. Ladona Ridgel, MD  CHIEF COMPLAINT:  Chest pain.  HISTORY OF PRESENT ILLNESS:  Andrea [name] is a 60 year old female with a history of coronary artery disease.  She was doing well until 6:21 p.m. She bent down and was lifting some clothes and pants when she had onset of 5/10 substernal chest pain.  She describes it as a pressure and thought that belching would help.  She did belch several times but it did not make a significant difference.  The pain was worse when lying down, better when sitting up, worse when leaning forward.  There was no exertional component.  It was not helped or changed at all by food, position, or Tums today.  She has had no significant weight change and there was no shortness of breath.  Her chronic dyspnea on exertion was unchanged.  She has been compliant with all medications.  It reminded her of her previous anginal pain.  Today, she tried some sublingual nitroglycerin which helped some and decreased her pain temporarily from a 5 to a 2/10.  She called the office and came to the ER as requested. A GI cocktail was given without relief.  Currently, she is still experiencing chest pain.  PAST MEDICAL HISTORY: 1. Non-ST segment elevation MI, discharged April 10, 2011, with Promus     drug-eluting stent to the proximal RCA. 2. Status post Promus drug-eluting stent to the distal RCA, December     2011. 3. Residual nonobstructive coronary artery disease in the LAD and     circumflex systems at 30-50%. 4. Ischemic cardiomyopathy with an  EF of 15% by echocardiogram in     2011. 5. Chronic systolic CHF. 6. Secondary prevention status post Medtronic ICD in November 2008. 7. Insulin-dependent diabetes. 8. Hypertension. 9. Hyperlipidemia. 10.Obesity. 11.Pulmonary hypertension. 12.CVA. 13.Gastroesophageal reflux disease. 14.Degenerative disk disease. 15.Thrombocytopenia. 16.Gout. 17.Depression.  PAST SURGICAL HISTORY:  She is status post multiple cardiac catheterizations as well as tonsillectomy, oophorectomy, foot surgery, ICD implantation, and neck surgery.  ALLERGIES:  She is allergic or intolerant to: 1. ASPIRIN. 2. DEMEROL. 3. NSAIDS. 4. COX II INHIBITORS. 5. OXYCODONE. 6. PERCOCET. 7. DARVON. 8. DARVOCET. 9. IMITREX. 10.TICAGRELOR.  CURRENT MEDICATIONS: 1. Tylenol p.r.n. 2. Lipitor 40 mg a day. 3. Citracal b.i.d. 4. Coreg 25 mg b.i.d. 5. Plavix 75 mg a day. 6. Dexilant 60 mg daily p.r.n., rarely taken. 7. Cymbalta 60 mg a day. 8. Uloric 40 mg 2 tablets daily. 9. TriCor 145 mg a day. 10.Lasix 40 mg b.i.d. 11.Lantus q.a.m. sliding scale 10-65 units daily. 12.Januvia 100 mg daily. 13.Sublingual nitroglycerin p.r.n. 14.Benicar 20 mg a day.  SOCIAL HISTORY:  She lives in [place] with her daughter and a partner.  She is unemployed.  She quit tobacco in 1983 with minimal use and has no history of alcohol or drug abuse.  FAMILY HISTORY:  Her father died in his 77s with a history of coronary artery disease and her mother died in her 60s also with a history of CAD.  One brother has cardiac issues as well.  REVIEW OF SYSTEMS:  She has not had fevers or chills.  She has occasional arthralgias and joint pains.  She has not had melena.  She has not had Dexilant recently and does not feel she has frequent reflux symptoms.  She has chronic dyspnea on exertion that is unchanged.  There is no PND, orthopnea, or palpitations.  Full 14-point review of systems is otherwise negative except as stated in the  HPI.  PHYSICAL EXAMINATION:  VITAL SIGNS:  Temperature is 98.7, blood pressure 130/68, heart rate 105, respiratory rate 18, and O2 saturation 99% on room air. GENERAL:  She is a well-developed, well-nourished, African American female in no acute distress. HEENT:  Normal. NECK:  There is no lymphadenopathy, thyromegaly, bruit, or JVD noted. CV:  Her heart is regular in rate and rhythm with an S1 and S2 and no significant murmur, rub, or gallop is noted.  Distal pulses are intact in all 4 extremities. LUNGS:  Clear to auscultation bilaterally except for a few basilar rales. SKIN:  No rashes or lesions are noted. ABDOMEN:  Soft and nontender with active bowel sounds. EXTREMITIES:  There is no cyanosis, clubbing, or edema noted. MUSCULOSKELETAL:  There is no joint deformity or effusions and no spine or CVA tenderness. NEUROLOGIC:  She is alert and oriented.  Cranial nerves II through XII grossly intact.  Chest x-ray shows cardiac enlargement and mild vascular congestion without edema.  EKG; sinus rhythm, rate 95 with some minimal inferolateral ST changes of unclear significance.  LABORATORY VALUES:  Hemoglobin 12.5, hematocrit 36.7, WBCs 4.7, and platelets 162.  Sodium 140, potassium 3.6, chloride 99, CO2 of 31, BUN 25, creatinine 0.94, and glucose 214.  Initial cardiac enzymes negative.  IMPRESSION:  Andrea [name] was seen today by Dr. [place], the patient evaluated and the data reviewed.  She is a 60 year old white female, well known to me.  She had a non-ST-segment elevation myocardial infarction in December 2011 with a stent to the RCA.  She had recurrent chest pain in April 2012 with new severe stenosis in proximal RCA that was stented.  She was unable to take Effient secondary to a prior cerebrovascular accident.  She developed gout on Brilinta.  She is now with recurrent chest pain of 3 days' duration that was similar to her prior angina.  Given her history, we will admit and  rule out myocardial infarction.  Cardiac catheterization is planned for tomorrow.     Theodore Demark, PA-C   ______________________________ Bartholomew Ramesh M. [place], M.D.    RB/MEDQ  D:  06/25/2011  T:  06/26/2011  Job:  [id number]  Electronically Signed by Theodore Demark PA-C on 07/17/2011 04:15:02 PM Electronically Signed by Mariann Palo [place] M.D. on 07/19/2011 11:49:49 AM

## 2011-07-20 ENCOUNTER — Other Ambulatory Visit: Payer: Self-pay | Admitting: Cardiology

## 2011-07-20 ENCOUNTER — Encounter (HOSPITAL_COMMUNITY): Payer: BC Managed Care – PPO | Attending: Cardiology

## 2011-07-20 DIAGNOSIS — E119 Type 2 diabetes mellitus without complications: Secondary | ICD-10-CM | POA: Insufficient documentation

## 2011-07-20 DIAGNOSIS — E669 Obesity, unspecified: Secondary | ICD-10-CM | POA: Insufficient documentation

## 2011-07-20 DIAGNOSIS — I509 Heart failure, unspecified: Secondary | ICD-10-CM | POA: Insufficient documentation

## 2011-07-20 DIAGNOSIS — I214 Non-ST elevation (NSTEMI) myocardial infarction: Secondary | ICD-10-CM | POA: Insufficient documentation

## 2011-07-20 DIAGNOSIS — I428 Other cardiomyopathies: Secondary | ICD-10-CM | POA: Insufficient documentation

## 2011-07-20 DIAGNOSIS — I5022 Chronic systolic (congestive) heart failure: Secondary | ICD-10-CM | POA: Insufficient documentation

## 2011-07-20 DIAGNOSIS — Z8673 Personal history of transient ischemic attack (TIA), and cerebral infarction without residual deficits: Secondary | ICD-10-CM | POA: Insufficient documentation

## 2011-07-20 DIAGNOSIS — Z5189 Encounter for other specified aftercare: Secondary | ICD-10-CM | POA: Insufficient documentation

## 2011-07-20 DIAGNOSIS — Z794 Long term (current) use of insulin: Secondary | ICD-10-CM | POA: Insufficient documentation

## 2011-07-20 DIAGNOSIS — I10 Essential (primary) hypertension: Secondary | ICD-10-CM | POA: Insufficient documentation

## 2011-07-20 DIAGNOSIS — Z9861 Coronary angioplasty status: Secondary | ICD-10-CM | POA: Insufficient documentation

## 2011-07-20 DIAGNOSIS — E785 Hyperlipidemia, unspecified: Secondary | ICD-10-CM | POA: Insufficient documentation

## 2011-07-20 DIAGNOSIS — I2510 Atherosclerotic heart disease of native coronary artery without angina pectoris: Secondary | ICD-10-CM | POA: Insufficient documentation

## 2011-07-20 DIAGNOSIS — I2789 Other specified pulmonary heart diseases: Secondary | ICD-10-CM | POA: Insufficient documentation

## 2011-07-20 DIAGNOSIS — Z95810 Presence of automatic (implantable) cardiac defibrillator: Secondary | ICD-10-CM | POA: Insufficient documentation

## 2011-07-20 LAB — GLUCOSE, CAPILLARY: Glucose-Capillary: 280 mg/dL — ABNORMAL HIGH (ref 70–99)

## 2011-07-23 ENCOUNTER — Encounter (HOSPITAL_COMMUNITY): Payer: BC Managed Care – PPO

## 2011-07-25 ENCOUNTER — Encounter (HOSPITAL_COMMUNITY): Payer: BC Managed Care – PPO

## 2011-07-27 ENCOUNTER — Encounter (HOSPITAL_COMMUNITY): Payer: BC Managed Care – PPO

## 2011-07-30 ENCOUNTER — Encounter (HOSPITAL_COMMUNITY): Payer: BC Managed Care – PPO

## 2011-07-31 NOTE — Telephone Encounter (Signed)
No note necessary for this encounter. °

## 2011-08-01 ENCOUNTER — Encounter (HOSPITAL_COMMUNITY): Payer: BC Managed Care – PPO

## 2011-08-03 ENCOUNTER — Encounter (HOSPITAL_COMMUNITY): Payer: BC Managed Care – PPO

## 2011-08-06 ENCOUNTER — Encounter (HOSPITAL_COMMUNITY): Payer: BC Managed Care – PPO

## 2011-08-08 ENCOUNTER — Encounter (HOSPITAL_COMMUNITY): Payer: BC Managed Care – PPO

## 2011-08-10 ENCOUNTER — Encounter (HOSPITAL_COMMUNITY): Payer: BC Managed Care – PPO

## 2011-08-13 ENCOUNTER — Encounter (HOSPITAL_COMMUNITY): Payer: BC Managed Care – PPO

## 2011-08-15 ENCOUNTER — Encounter (HOSPITAL_COMMUNITY): Payer: BC Managed Care – PPO

## 2011-08-17 ENCOUNTER — Encounter (HOSPITAL_COMMUNITY): Payer: BC Managed Care – PPO

## 2011-08-20 ENCOUNTER — Encounter (HOSPITAL_COMMUNITY): Payer: BC Managed Care – PPO

## 2011-08-22 ENCOUNTER — Encounter (HOSPITAL_COMMUNITY): Payer: BC Managed Care – PPO

## 2011-08-24 ENCOUNTER — Encounter (HOSPITAL_COMMUNITY): Payer: BC Managed Care – PPO

## 2011-09-05 ENCOUNTER — Other Ambulatory Visit: Payer: Self-pay | Admitting: *Deleted

## 2011-09-05 DIAGNOSIS — I10 Essential (primary) hypertension: Secondary | ICD-10-CM

## 2011-09-05 MED ORDER — OLMESARTAN MEDOXOMIL 40 MG PO TABS: 40.0000 mg | ORAL_TABLET | Freq: Every day | ORAL | Status: DC

## 2011-09-05 NOTE — Telephone Encounter (Signed)
escribe medication per fax request  

## 2011-09-13 ENCOUNTER — Encounter (INDEPENDENT_AMBULATORY_CARE_PROVIDER_SITE_OTHER): Payer: BC Managed Care – PPO | Admitting: Ophthalmology

## 2011-10-01 ENCOUNTER — Other Ambulatory Visit: Payer: Self-pay | Admitting: Cardiology

## 2011-10-01 NOTE — Telephone Encounter (Signed)
Refilled Meds from fax  

## 2011-10-02 ENCOUNTER — Encounter (INDEPENDENT_AMBULATORY_CARE_PROVIDER_SITE_OTHER): Payer: BC Managed Care – PPO | Admitting: Ophthalmology

## 2011-10-05 LAB — TROPONIN I: Troponin I: 0.01 ng/mL (ref 0.00–0.06)

## 2011-10-05 LAB — CK TOTAL AND CKMB (NOT AT ARMC)
Relative Index: INVALID (ref 0.0–2.5)
Total CK: 81 U/L (ref 7–177)

## 2011-10-05 LAB — POCT I-STAT, CHEM 8
BUN: 23 mg/dL (ref 6–23)
Calcium, Ion: 1.23 mmol/L (ref 1.12–1.32)
HCT: 45 % (ref 36.0–46.0)
Hemoglobin: 15.3 g/dL — ABNORMAL HIGH (ref 12.0–15.0)
Sodium: 141 mEq/L (ref 135–145)
TCO2: 28 mmol/L (ref 0–100)

## 2011-10-05 LAB — B-NATRIURETIC PEPTIDE (CONVERTED LAB): Pro B Natriuretic peptide (BNP): 139 pg/mL — ABNORMAL HIGH (ref 0.0–100.0)

## 2011-10-09 LAB — APTT: aPTT: 27

## 2011-10-09 LAB — CBC
HCT: 35.2 — ABNORMAL LOW
Hemoglobin: 12.3
RBC: 4.83
WBC: 5.5

## 2011-10-09 LAB — BASIC METABOLIC PANEL
Chloride: 103
GFR calc non Af Amer: >60
Potassium: 4.8
Sodium: 140

## 2011-10-11 LAB — POCT I-STAT GLUCOSE
Glucose, Bld: 166 — ABNORMAL HIGH
Operator id: 221371

## 2011-10-16 ENCOUNTER — Encounter (INDEPENDENT_AMBULATORY_CARE_PROVIDER_SITE_OTHER): Payer: BC Managed Care – PPO | Admitting: Ophthalmology

## 2011-10-16 DIAGNOSIS — H3530 Unspecified macular degeneration: Secondary | ICD-10-CM

## 2011-10-16 DIAGNOSIS — E11319 Type 2 diabetes mellitus with unspecified diabetic retinopathy without macular edema: Secondary | ICD-10-CM

## 2011-10-16 DIAGNOSIS — H43819 Vitreous degeneration, unspecified eye: Secondary | ICD-10-CM

## 2011-10-16 DIAGNOSIS — H2510 Age-related nuclear cataract, unspecified eye: Secondary | ICD-10-CM

## 2011-10-17 ENCOUNTER — Ambulatory Visit: Payer: BC Managed Care – PPO | Admitting: Cardiology

## 2011-11-05 ENCOUNTER — Ambulatory Visit: Payer: BC Managed Care – PPO | Admitting: Cardiology

## 2011-11-28 ENCOUNTER — Encounter: Payer: Self-pay | Admitting: Cardiology

## 2011-11-28 ENCOUNTER — Ambulatory Visit (INDEPENDENT_AMBULATORY_CARE_PROVIDER_SITE_OTHER): Payer: BC Managed Care – PPO | Admitting: Cardiology

## 2011-11-28 VITALS — BP 122/80 | HR 103 | Ht 60.0 in | Wt 204.0 lb

## 2011-11-28 DIAGNOSIS — Z95810 Presence of automatic (implantable) cardiac defibrillator: Secondary | ICD-10-CM

## 2011-11-28 DIAGNOSIS — I2510 Atherosclerotic heart disease of native coronary artery without angina pectoris: Secondary | ICD-10-CM

## 2011-11-28 DIAGNOSIS — E785 Hyperlipidemia, unspecified: Secondary | ICD-10-CM

## 2011-11-28 DIAGNOSIS — I5022 Chronic systolic (congestive) heart failure: Secondary | ICD-10-CM

## 2011-11-28 MED ORDER — FENOFIBRATE 145 MG PO TABS: 145.0000 mg | ORAL_TABLET | Freq: Every day | ORAL | Status: DC

## 2011-11-28 NOTE — Assessment & Plan Note (Signed)
She is well compensated on exam. I have reinforced the need for sodium restriction. She will continue on her carvedilol, Lasix, and Benicar. I'll followup again in 4 months.

## 2011-11-28 NOTE — Assessment & Plan Note (Signed)
Status post stenting of the right coronary as noted above. Medically doing very well at this time. We will continue with dual antiplatelet therapy.

## 2011-11-28 NOTE — Assessment & Plan Note (Signed)
We will reestablish in our ICD clinic.

## 2011-11-28 NOTE — Progress Notes (Signed)
Andrea [name] Date of Birth: 1951-07-29   History of Present Illness: Andrea [name] is seen for followup today. She is status post stenting of the distal right coronary with a 3.5 x 16 mm Promus Element stent on December 15, 2010. She did well initially but began experiencing recurrent chest pain in early April. She had a nuclear stress test which showed evidence of inferior infarction with some period infarct ischemia. She continued to have chest pain he was admitted on April 9 with a small non-ST elevation myocardial infarction. She underwent repeat cardiac catheterization was found to have a high-grade stenosis in the proximal right coronary. This was subtotally stented with a 3.5 x 16 mm Promus Element stent. This was in an area that on previous catheterization had only mild disease. She was readmitted in early July with recurrent chest pain. Her cardiac catheterization at that time showed nonobstructive disease. Since her last visit here she was evaluated by gastroenterology and Carafate was added for her acid reflux symptoms. This has significantly improved her chest pain symptoms. She is interested now in going back to cardiac rehabilitation since she was never able to complete the program. She denies any increase in shortness of breath. Her swelling has been stable. If it increases she will take an extra furosemide. She has been doing some upper extremity exercises to help with her back but this has resulted in some tenderness around her ICD site.  Current Outpatient Prescriptions on File Prior to Visit  Medication Sig Dispense Refill  . acetaminophen (TYLENOL) 500 MG tablet Take 500 mg by mouth as needed.        Marland Kitchen atorvastatin (LIPITOR) 40 MG tablet Take 1 tablet by mouth at bedtime.      . Calcium Citrate (CITRACAL PO) Take 1 tablet by mouth 2 (two) times daily.        . carvedilol (COREG) 25 MG tablet Take 1 tablet by mouth Twice daily.      . DULoxetine (CYMBALTA) 60 MG capsule Take 60 mg  by mouth daily.        . febuxostat (ULORIC) 40 MG tablet Take 80 mg by mouth daily.        . furosemide (LASIX) 40 MG tablet Take 1 tablet (40 mg total) by mouth 2 (two) times daily.  60 tablet  5  . insulin glargine (LANTUS) 100 UNIT/ML injection Inject into the skin as directed.       Marland Kitchen JANUVIA 100 MG tablet Take 1 tablet by mouth Daily.      . nitroGLYCERIN (NITROSTAT) 0.4 MG SL tablet Place 0.4 mg under the tongue every 5 (five) minutes as needed.        Marland Kitchen olmesartan (BENICAR) 40 MG tablet Take 1 tablet (40 mg total) by mouth daily.  90 tablet  3  . ONE TOUCH ULTRA TEST test strip as directed.      . Ticagrelor (BRILINTA) 90 MG TABS tablet Take 1 tablet (90 mg total) by mouth 2 (two) times daily.  60 tablet  12  . DISCONTD: fenofibrate (TRICOR) 145 MG tablet Take 1 tablet (145 mg total) by mouth daily.  30 tablet  11    Allergies  Allergen Reactions  . Percocet (Oxycodone-Acetaminophen) Nausea And Vomiting  . Aspirin   . Darvon   . Imitrex (Sumatriptan Base)   . Nsaids   . Propoxyphene Hcl   . Ticagrelor Other (See Comments)    Gout     Past Medical History  Diagnosis Date  .  ISCHEMIC HEART DISEASE 02/14/2011  . Chronic systolic heart failure 02/14/2011  . AUTOMATIC IMPLANTABLE CARDIAC DEFIBRILLATOR SITU 02/14/2011  . Hypertension   . Stroke 02/2010    left brain CVA? recurrent TIA's treated with TPA  . Diabetes mellitus   . Dyslipidemia   . Premature ventricular contractions (PVCs) (VPCs)   . Arthritis   . Migraine headache   . GERD (gastroesophageal reflux disease)   . Coronary artery disease     stenting of right coronary artery. also diagonal stenosis - treated medically  . Hyperlipidemia   . Dilated cardiomyopathy     with an ejection fraction of around 15%  . Pulmonary hypertension   . Gout     Past Surgical History  Procedure Date  . Cardiac catheterization   . Tonsillectomy   . Oophorectomy     right  . Mass excision     left hip  . Cervical  polypectomy     History  Smoking status  . Former Smoker -- 1 years  . Types: Cigarettes  . Quit date: 04/23/1982  Smokeless tobacco  . Never Used    History  Alcohol Use No    Family History  Problem Relation Age of Onset  . Heart failure Mother   . Heart attack Father     Review of Systems: The review of systems is positive for suboptimal glycemic control.  It appears that her last ICD check was over 6 months ago. All other systems were reviewed and are negative.  Physical Exam: BP 122/80  Pulse 103  Ht 5' (1.524 m)  Wt 204 lb (92.534 kg)  BMI 39.84 kg/m2 She is an obese black female in no acute distress. HEENT exam is unremarkable. She has no JVD or bruits. Lungs are clear. Cardiac exam reveals a regular rate and rhythm without gallop, murmur, or click. ICD is in place. There is no swelling or tenderness around the ICD site. Abdomen is obese, soft, nontender. Pedal pulses are good. She has no edema. LABORATORY DATA:   Assessment / Plan:

## 2011-11-28 NOTE — Patient Instructions (Signed)
We will refer you back to cardiac rehab.  We will get your ICD checked in our ICD clinic.  I will see you again in 4 months. If Dr. Nicholos Johns hasn't checked your lipids we can check fasting blood work then.

## 2012-01-10 ENCOUNTER — Encounter (HOSPITAL_COMMUNITY)
Admission: RE | Admit: 2012-01-10 | Discharge: 2012-01-10 | Disposition: A | Discharge status: Home / Self Care | Payer: BC Managed Care – PPO | Source: Ambulatory Visit | Attending: Cardiology | Admitting: Cardiology

## 2012-01-10 ENCOUNTER — Ambulatory Visit (HOSPITAL_COMMUNITY): Payer: BC Managed Care – PPO

## 2012-01-10 DIAGNOSIS — I5022 Chronic systolic (congestive) heart failure: Secondary | ICD-10-CM | POA: Insufficient documentation

## 2012-01-10 DIAGNOSIS — Z794 Long term (current) use of insulin: Secondary | ICD-10-CM | POA: Insufficient documentation

## 2012-01-10 DIAGNOSIS — F329 Major depressive disorder, single episode, unspecified: Secondary | ICD-10-CM | POA: Insufficient documentation

## 2012-01-10 DIAGNOSIS — E785 Hyperlipidemia, unspecified: Secondary | ICD-10-CM | POA: Insufficient documentation

## 2012-01-10 DIAGNOSIS — I2789 Other specified pulmonary heart diseases: Secondary | ICD-10-CM | POA: Insufficient documentation

## 2012-01-10 DIAGNOSIS — Z95810 Presence of automatic (implantable) cardiac defibrillator: Secondary | ICD-10-CM | POA: Insufficient documentation

## 2012-01-10 DIAGNOSIS — I2510 Atherosclerotic heart disease of native coronary artery without angina pectoris: Secondary | ICD-10-CM | POA: Insufficient documentation

## 2012-01-10 DIAGNOSIS — I259 Chronic ischemic heart disease, unspecified: Secondary | ICD-10-CM | POA: Insufficient documentation

## 2012-01-10 DIAGNOSIS — Z5189 Encounter for other specified aftercare: Secondary | ICD-10-CM | POA: Insufficient documentation

## 2012-01-10 DIAGNOSIS — Z9861 Coronary angioplasty status: Secondary | ICD-10-CM | POA: Insufficient documentation

## 2012-01-10 DIAGNOSIS — Z7902 Long term (current) use of antithrombotics/antiplatelets: Secondary | ICD-10-CM | POA: Insufficient documentation

## 2012-01-10 DIAGNOSIS — I10 Essential (primary) hypertension: Secondary | ICD-10-CM | POA: Insufficient documentation

## 2012-01-10 DIAGNOSIS — F3289 Other specified depressive episodes: Secondary | ICD-10-CM | POA: Insufficient documentation

## 2012-01-10 DIAGNOSIS — K219 Gastro-esophageal reflux disease without esophagitis: Secondary | ICD-10-CM | POA: Insufficient documentation

## 2012-01-10 DIAGNOSIS — E669 Obesity, unspecified: Secondary | ICD-10-CM | POA: Insufficient documentation

## 2012-01-10 DIAGNOSIS — E119 Type 2 diabetes mellitus without complications: Secondary | ICD-10-CM | POA: Insufficient documentation

## 2012-01-10 DIAGNOSIS — Z79899 Other long term (current) drug therapy: Secondary | ICD-10-CM | POA: Insufficient documentation

## 2012-01-10 DIAGNOSIS — I252 Old myocardial infarction: Secondary | ICD-10-CM | POA: Insufficient documentation

## 2012-01-10 DIAGNOSIS — Z8673 Personal history of transient ischemic attack (TIA), and cerebral infarction without residual deficits: Secondary | ICD-10-CM | POA: Insufficient documentation

## 2012-01-10 DIAGNOSIS — I509 Heart failure, unspecified: Secondary | ICD-10-CM | POA: Insufficient documentation

## 2012-01-14 ENCOUNTER — Encounter (HOSPITAL_COMMUNITY): Payer: BC Managed Care – PPO

## 2012-01-15 ENCOUNTER — Encounter: Payer: Self-pay | Admitting: Internal Medicine

## 2012-01-16 ENCOUNTER — Encounter (HOSPITAL_COMMUNITY)
Admission: RE | Admit: 2012-01-16 | Discharge: 2012-01-16 | Disposition: A | Discharge status: Home / Self Care | Payer: BC Managed Care – PPO | Source: Ambulatory Visit | Attending: Cardiology | Admitting: Cardiology

## 2012-01-16 LAB — GLUCOSE, CAPILLARY: Glucose-Capillary: 117 mg/dL — ABNORMAL HIGH (ref 70–99)

## 2012-01-16 NOTE — Progress Notes (Signed)
Andrea [name] came to exercise today at Cardiac rehab telemetry rhythm Sinus Tach 108-116.  Andrea [name] says she is out of her atorvastatin, coreg, furosemide for at least two weeks.  Andrea [name] says she has been out of her ticagrelor (brilinta), for two months.  I told Andrea [name] it is very important to take her medications as prescribed especially with her medical history.  Andrea [name] says she is unable to fill her prescription due to finances and will be unable to get her medications refilled until the first of February.  Andrea [name] Dr. Elvis Coil office called and notified.  Andrea [name] was able to obtain samples of benicar and ticagrelor at the office.  No exercise today.  Patient instructed not to return to exercise until she is taking her medications as prescribed.  Blood pressure 138/80.  CBG 117. Patient left cardiac rehab without complaints.  Andrea [name] is to go to Dr. Elvis Coil office to pick up available samples.

## 2012-01-17 ENCOUNTER — Ambulatory Visit (HOSPITAL_COMMUNITY): Payer: BC Managed Care – PPO

## 2012-01-17 NOTE — Progress Notes (Signed)
Andrea [name] 61 y.o. female       Nutrition Screen                                                                    YES  NO Do you live in a nursing home?  X   Do you eat out more than 3 times/week?    X If yes, how many times per week do you eat out?  Do you have food allergies?  X X If yes, what are you allergic to? Seafood, black licorice  Have you gained or lost more than 10 lbs without trying?                If yes, how much weight have you lost and over what time period?  lbs gained or lost over  weeks/month  Do you want to lose weight?    X  If yes, what is a goal weight or amount of weight you would like to lose? Goal 170 lbs  Do you eat alone most of the time?  X    Do you eat less than 2 meals/day?  X If yes, how many meals do you eat?  Do you drink more than 3 alcohol drinks/day?  X If yes, how many drinks per day?  Are you having trouble with constipation? *  X If yes, what are you doing to help relieve constipation?  Do you have financial difficulties with buying food?*    X   Are you experiencing regular nausea/ vomiting?*     X   Do you have a poor appetite? *                                       X  "mild bulimia"  Do you have trouble chewing/swallowing? *   X    Pt with diagnoses of:  X Stent/ PTCA X GERD          X Dyslipidemia  / HDL< 40 / LDL>70 / High TG      X %  Body fat >goal / Body Mass Index >25 X HTN / BP >120/80 X MI X Gout     XCHF  X DM/A1c >6 / CBG >126       Pt Risk Score        8       Diagnosis Risk Score  135       Total Risk Score   143                        X High Risk                Low Risk              HT: 60" Ht Readings from Last 1 Encounters:  01/10/12 5' (1.524 m)    WT:   206.6 lb (93.9 kg) Wt Readings from Last 3 Encounters:  01/10/12 207 lb 0.2 oz (93.9 kg)  11/28/11 204 lb (92.534 kg)  07/17/11 196 lb 12.8 oz (89.268 kg)     IBW 45.5 206%IBW BMI 40.4 53.3%body fat  Meds reviewed: Januvia, Lasix, Lantus,  Sucralfate  Past Medical History  Diagnosis Date  . ISCHEMIC HEART DISEASE 02/14/2011  . Chronic systolic heart failure 02/14/2011  . AUTOMATIC IMPLANTABLE CARDIAC DEFIBRILLATOR SITU 02/14/2011  . Hypertension   . Stroke 02/2010    left brain CVA? recurrent TIA's treated with TPA  . Diabetes mellitus     insulin-dependent  . Dyslipidemia   . Premature ventricular contractions (PVCs) (VPCs)   . Arthritis   . Migraine headache   . GERD (gastroesophageal reflux disease)   . Coronary artery disease     stenting of right coronary artery. also diagonal stenosis - treated medically  . Hyperlipidemia   . Dilated cardiomyopathy     with an ejection fraction of around 15%  . Pulmonary hypertension   . Gout   . Obesity   . DJD (degenerative joint disease)   . Thrombocytopenia   . Depression         Activity level: Pt is sedentary  Wt goal: 182-194 lb ( 83-88.2 kg) Current tobacco use? No      Food/Drug Interaction? No      Labs:  Lipid Panel     Component Value Date/Time   CHOL 158 06/26/2011 0340   TRIG 337* 06/26/2011 0340   HDL 27* 06/26/2011 0340   CHOLHDL 5.9 06/26/2011 0340   VLDL 67* 06/26/2011 0340   LDLCALC 64 06/26/2011 0340   Lab Results  Component Value Date   HGBA1C 7.8* 06/25/2011   06/25/11 Glucose 214   LDL goal: < 70      MI, DM and > 2:  HTN, HDL, family h/o, >61 yo female   Estimated Daily Nutrition Needs for: ? wt loss  1200-1650 Kcal , Total Fat 30-45gm, Saturated Fat 8-13 gm, Trans Fat 1.1-1.7 gm,  Sodium less than 1500 mg, Grams of CHO 150-175

## 2012-01-18 ENCOUNTER — Encounter (HOSPITAL_COMMUNITY): Payer: BC Managed Care – PPO

## 2012-01-18 ENCOUNTER — Telehealth: Payer: Self-pay

## 2012-01-18 NOTE — Telephone Encounter (Signed)
Spoke with Byrd Hesselbach at [facility] 01/16/12.She stated patient has been out of most of her medications.Requesting samples, patient cannot buy meds until first of month.Byrd Hesselbach told patient very important to take medications.Samples of brilanta,benicar left at front desk 3rd floor.

## 2012-01-21 ENCOUNTER — Encounter (HOSPITAL_COMMUNITY): Payer: BC Managed Care – PPO

## 2012-01-22 ENCOUNTER — Encounter: Payer: BC Managed Care – PPO | Admitting: Internal Medicine

## 2012-01-23 ENCOUNTER — Encounter (HOSPITAL_COMMUNITY): Payer: BC Managed Care – PPO

## 2012-01-25 ENCOUNTER — Encounter (HOSPITAL_COMMUNITY): Payer: BC Managed Care – PPO

## 2012-01-28 ENCOUNTER — Encounter (HOSPITAL_COMMUNITY): Payer: BC Managed Care – PPO

## 2012-01-30 ENCOUNTER — Encounter (HOSPITAL_COMMUNITY): Payer: BC Managed Care – PPO

## 2012-02-01 ENCOUNTER — Encounter (HOSPITAL_COMMUNITY): Payer: BC Managed Care – PPO

## 2012-02-04 ENCOUNTER — Encounter (HOSPITAL_COMMUNITY): Payer: BC Managed Care – PPO

## 2012-02-06 ENCOUNTER — Encounter (HOSPITAL_COMMUNITY): Payer: BC Managed Care – PPO

## 2012-02-08 ENCOUNTER — Encounter (HOSPITAL_COMMUNITY): Payer: BC Managed Care – PPO

## 2012-02-11 ENCOUNTER — Telehealth (HOSPITAL_COMMUNITY): Payer: Self-pay | Admitting: *Deleted

## 2012-02-11 ENCOUNTER — Encounter (HOSPITAL_COMMUNITY): Payer: BC Managed Care – PPO

## 2012-02-13 ENCOUNTER — Encounter (HOSPITAL_COMMUNITY): Payer: BC Managed Care – PPO

## 2012-02-15 ENCOUNTER — Encounter (HOSPITAL_COMMUNITY): Payer: BC Managed Care – PPO

## 2012-02-18 ENCOUNTER — Encounter (HOSPITAL_COMMUNITY): Payer: BC Managed Care – PPO

## 2012-02-20 ENCOUNTER — Telehealth: Payer: Self-pay | Admitting: *Deleted

## 2012-02-20 ENCOUNTER — Encounter (HOSPITAL_COMMUNITY)
Admission: RE | Admit: 2012-02-20 | Discharge: 2012-02-20 | Disposition: A | Discharge status: Home / Self Care | Payer: BC Managed Care – PPO | Source: Ambulatory Visit | Attending: Cardiology | Admitting: Cardiology

## 2012-02-20 DIAGNOSIS — Z8673 Personal history of transient ischemic attack (TIA), and cerebral infarction without residual deficits: Secondary | ICD-10-CM | POA: Insufficient documentation

## 2012-02-20 DIAGNOSIS — F3289 Other specified depressive episodes: Secondary | ICD-10-CM | POA: Insufficient documentation

## 2012-02-20 DIAGNOSIS — I10 Essential (primary) hypertension: Secondary | ICD-10-CM | POA: Insufficient documentation

## 2012-02-20 DIAGNOSIS — Z5189 Encounter for other specified aftercare: Secondary | ICD-10-CM | POA: Insufficient documentation

## 2012-02-20 DIAGNOSIS — Z95810 Presence of automatic (implantable) cardiac defibrillator: Secondary | ICD-10-CM | POA: Insufficient documentation

## 2012-02-20 DIAGNOSIS — I259 Chronic ischemic heart disease, unspecified: Secondary | ICD-10-CM | POA: Insufficient documentation

## 2012-02-20 DIAGNOSIS — E785 Hyperlipidemia, unspecified: Secondary | ICD-10-CM | POA: Insufficient documentation

## 2012-02-20 DIAGNOSIS — I2510 Atherosclerotic heart disease of native coronary artery without angina pectoris: Secondary | ICD-10-CM

## 2012-02-20 DIAGNOSIS — I509 Heart failure, unspecified: Secondary | ICD-10-CM | POA: Insufficient documentation

## 2012-02-20 DIAGNOSIS — I252 Old myocardial infarction: Secondary | ICD-10-CM | POA: Insufficient documentation

## 2012-02-20 DIAGNOSIS — Z7902 Long term (current) use of antithrombotics/antiplatelets: Secondary | ICD-10-CM | POA: Insufficient documentation

## 2012-02-20 DIAGNOSIS — K219 Gastro-esophageal reflux disease without esophagitis: Secondary | ICD-10-CM | POA: Insufficient documentation

## 2012-02-20 DIAGNOSIS — Z794 Long term (current) use of insulin: Secondary | ICD-10-CM | POA: Insufficient documentation

## 2012-02-20 DIAGNOSIS — E119 Type 2 diabetes mellitus without complications: Secondary | ICD-10-CM | POA: Insufficient documentation

## 2012-02-20 DIAGNOSIS — Z79899 Other long term (current) drug therapy: Secondary | ICD-10-CM | POA: Insufficient documentation

## 2012-02-20 DIAGNOSIS — I5022 Chronic systolic (congestive) heart failure: Secondary | ICD-10-CM | POA: Insufficient documentation

## 2012-02-20 DIAGNOSIS — F329 Major depressive disorder, single episode, unspecified: Secondary | ICD-10-CM | POA: Insufficient documentation

## 2012-02-20 DIAGNOSIS — E669 Obesity, unspecified: Secondary | ICD-10-CM | POA: Insufficient documentation

## 2012-02-20 DIAGNOSIS — I2789 Other specified pulmonary heart diseases: Secondary | ICD-10-CM | POA: Insufficient documentation

## 2012-02-20 DIAGNOSIS — Z9861 Coronary angioplasty status: Secondary | ICD-10-CM | POA: Insufficient documentation

## 2012-02-20 LAB — GLUCOSE, CAPILLARY: Glucose-Capillary: 270 mg/dL — ABNORMAL HIGH (ref 70–99)

## 2012-02-20 MED ORDER — CARVEDILOL 25 MG PO TABS: 25.0000 mg | ORAL_TABLET | Freq: Two times a day (BID) | ORAL | Status: DC

## 2012-02-20 MED ORDER — TICAGRELOR 90 MG PO TABS: 90.0000 mg | ORAL_TABLET | Freq: Two times a day (BID) | ORAL | Status: DC

## 2012-02-20 NOTE — Telephone Encounter (Signed)
Maria from cardiac rehab calls b/c pt is out of her Coreg and [place].  She is not able to exercise today due to this. 30 day supply of Carvedilol ordered to [facility] ( it is $10.00 there).  Samples of the Brilianta will be at desk for pt to pick up today. Should be able to exercise on Friday if taking her medications. Mylo Red RN

## 2012-02-20 NOTE — Progress Notes (Signed)
Andrea [name] came to Cardiac Rehab says she is still not taking her Coreg and Brillianta due to cost.  Patient instructed that she cannot exercise if she is not taking her medications as prescribed. Dr Elvis Coil office notified. Patient to go by the office to pick up samples of ticagrelor.Andrea [name] is supposed to go to [employer] to get her Coreg filled. Andrea [name] says she will come back  On Friday ready to exercise and will take  Her medications prior to coming to class.  No exercise today.

## 2012-02-22 ENCOUNTER — Encounter (HOSPITAL_COMMUNITY)
Admission: RE | Admit: 2012-02-22 | Discharge: 2012-02-22 | Disposition: A | Discharge status: Home / Self Care | Payer: BC Managed Care – PPO | Source: Ambulatory Visit | Attending: Cardiology | Admitting: Cardiology

## 2012-02-22 ENCOUNTER — Telehealth: Payer: Self-pay | Admitting: Cardiology

## 2012-02-22 NOTE — Telephone Encounter (Signed)
Received call from [facility] at [facility] stating patient's B/P low at 81/50,102/60 pulse 91 beats/min.States patient just started taking coreg 25 mg twice daily this past wed 02/20/12. States has not took in about a year unable to afford.Spoke with [name] NP,advised to decease coreg to 25 mg 1/2 tablet twice daily,and monitor B/P.

## 2012-02-22 NOTE — Progress Notes (Signed)
Andrea [name] came to Cardiac rehab to exercise today. Patient complaining of feeling lightheaded.  Manual blood pressure 86/64  Blood pressure 103/54 via dinemapp.  Telemetry Sinus rhythm 911.  No exercise today.  Patient given water repeat blood pressure 81/50 sitting via dinemapp, 102/66 standing.  Andrea [name] restarted taking her Coreg on Wednesday.  Dr Elvis Coil office called and notified spoke with Andrea [name], Dr Elvis Coil nurse.  Andrea [name] talked with Andrea Fredrickson NP.  Andrea [name] called back and spoke with patient over the phone.  Patient was instructed per Andrea Fredrickson NP to decrease Coreg to 12.5 mg twice a day.  Patient left Cardiac rehab without complaints.

## 2012-02-22 NOTE — Telephone Encounter (Signed)
New problem:  Patient at cardiac rehab c/o dizziness.

## 2012-02-25 ENCOUNTER — Encounter (HOSPITAL_COMMUNITY)
Admission: RE | Admit: 2012-02-25 | Discharge: 2012-02-25 | Disposition: A | Discharge status: Home / Self Care | Payer: BC Managed Care – PPO | Source: Ambulatory Visit | Attending: Cardiology | Admitting: Cardiology

## 2012-02-26 NOTE — Progress Notes (Signed)
Pt started cardiac rehab today.  Pt tolerated light exercise without major complaints.  No complaints of dizziness during exercise today.  Telemetry Sinus Rhythm Sinus tach. Corrine is deconditioned.  Encouraged the patient to take several rest breaks yesterday.  Corrine says she is taking her medications as prescribed. Corrine says she feels better since her Coreg dose has been decreased. Will continue to monitor the patient throughout  the program.

## 2012-02-27 ENCOUNTER — Encounter (HOSPITAL_COMMUNITY): Admission: RE | Admit: 2012-02-27 | Payer: BC Managed Care – PPO | Source: Ambulatory Visit

## 2012-02-29 ENCOUNTER — Encounter (HOSPITAL_COMMUNITY): Payer: BC Managed Care – PPO

## 2012-03-03 ENCOUNTER — Encounter (HOSPITAL_COMMUNITY)
Admission: RE | Admit: 2012-03-03 | Discharge: 2012-03-03 | Disposition: A | Discharge status: Home / Self Care | Payer: BC Managed Care – PPO | Source: Ambulatory Visit | Attending: Cardiology | Admitting: Cardiology

## 2012-03-03 DIAGNOSIS — Z8673 Personal history of transient ischemic attack (TIA), and cerebral infarction without residual deficits: Secondary | ICD-10-CM | POA: Insufficient documentation

## 2012-03-03 DIAGNOSIS — I10 Essential (primary) hypertension: Secondary | ICD-10-CM | POA: Insufficient documentation

## 2012-03-03 DIAGNOSIS — E119 Type 2 diabetes mellitus without complications: Secondary | ICD-10-CM | POA: Insufficient documentation

## 2012-03-03 DIAGNOSIS — Z5189 Encounter for other specified aftercare: Secondary | ICD-10-CM | POA: Insufficient documentation

## 2012-03-03 DIAGNOSIS — Z95810 Presence of automatic (implantable) cardiac defibrillator: Secondary | ICD-10-CM | POA: Insufficient documentation

## 2012-03-03 DIAGNOSIS — Z9861 Coronary angioplasty status: Secondary | ICD-10-CM | POA: Insufficient documentation

## 2012-03-03 DIAGNOSIS — I2510 Atherosclerotic heart disease of native coronary artery without angina pectoris: Secondary | ICD-10-CM | POA: Insufficient documentation

## 2012-03-03 DIAGNOSIS — I5022 Chronic systolic (congestive) heart failure: Secondary | ICD-10-CM | POA: Insufficient documentation

## 2012-03-03 DIAGNOSIS — E669 Obesity, unspecified: Secondary | ICD-10-CM | POA: Insufficient documentation

## 2012-03-03 DIAGNOSIS — I259 Chronic ischemic heart disease, unspecified: Secondary | ICD-10-CM | POA: Insufficient documentation

## 2012-03-03 DIAGNOSIS — I2789 Other specified pulmonary heart diseases: Secondary | ICD-10-CM | POA: Insufficient documentation

## 2012-03-03 DIAGNOSIS — K219 Gastro-esophageal reflux disease without esophagitis: Secondary | ICD-10-CM | POA: Insufficient documentation

## 2012-03-03 DIAGNOSIS — Z79899 Other long term (current) drug therapy: Secondary | ICD-10-CM | POA: Insufficient documentation

## 2012-03-03 DIAGNOSIS — Z794 Long term (current) use of insulin: Secondary | ICD-10-CM | POA: Insufficient documentation

## 2012-03-03 DIAGNOSIS — Z7902 Long term (current) use of antithrombotics/antiplatelets: Secondary | ICD-10-CM | POA: Insufficient documentation

## 2012-03-03 DIAGNOSIS — I252 Old myocardial infarction: Secondary | ICD-10-CM | POA: Insufficient documentation

## 2012-03-03 DIAGNOSIS — E785 Hyperlipidemia, unspecified: Secondary | ICD-10-CM | POA: Insufficient documentation

## 2012-03-03 DIAGNOSIS — F3289 Other specified depressive episodes: Secondary | ICD-10-CM | POA: Insufficient documentation

## 2012-03-03 DIAGNOSIS — F329 Major depressive disorder, single episode, unspecified: Secondary | ICD-10-CM | POA: Insufficient documentation

## 2012-03-03 DIAGNOSIS — I509 Heart failure, unspecified: Secondary | ICD-10-CM | POA: Insufficient documentation

## 2012-03-03 LAB — GLUCOSE, CAPILLARY: Glucose-Capillary: 322 mg/dL — ABNORMAL HIGH (ref 70–99)
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
## 2012-03-03 NOTE — Progress Notes (Signed)
CBG 322. No exercise per protocol. Patient counseled by the dietitian.  Urine negative for ketones. Repeat CBG 363 45 minutes later.  Patient drank a large glass of H20 while talking with the dietitian.  Andrea [name] said she only took 61 units of Lantus this morning with her insulin pen.. Will notify Dr Nicholos Johns patients primary care physician via fax.

## 2012-03-04 ENCOUNTER — Other Ambulatory Visit (HOSPITAL_COMMUNITY): Payer: Self-pay | Admitting: Gastroenterology

## 2012-03-04 DIAGNOSIS — R110 Nausea: Secondary | ICD-10-CM

## 2012-03-05 ENCOUNTER — Telehealth: Payer: Self-pay

## 2012-03-05 ENCOUNTER — Other Ambulatory Visit: Payer: Self-pay

## 2012-03-05 ENCOUNTER — Encounter (HOSPITAL_COMMUNITY)
Admission: RE | Admit: 2012-03-05 | Discharge: 2012-03-05 | Disposition: A | Discharge status: Home / Self Care | Payer: BC Managed Care – PPO | Source: Ambulatory Visit | Attending: Cardiology | Admitting: Cardiology

## 2012-03-05 DIAGNOSIS — I2510 Atherosclerotic heart disease of native coronary artery without angina pectoris: Secondary | ICD-10-CM

## 2012-03-05 LAB — GLUCOSE, CAPILLARY
Glucose-Capillary: 175 mg/dL — ABNORMAL HIGH (ref 70–99)
Glucose-Capillary: 208 mg/dL — ABNORMAL HIGH (ref 70–99)

## 2012-03-05 NOTE — Progress Notes (Signed)
Reviewed home exercise with pt today.  Pt plans to walk at home for exercise.  Reviewed THR, pulse, RPE, sign and symptoms, NTG use, and when to call 911 or MD.  Pt voiced understanding.  Also, held off on bike testing today sue to heart rate.  We will attempt this on Friday 03/07/12. Fabio Pierce, MA, ACSM RCEP

## 2012-03-05 NOTE — Telephone Encounter (Signed)
Andrea [name] from cardiac rehab called, stated patient having frequent PVC's,taking furosemide twice daily and not on potassium.Spoke to [name] NP advised to have bmet checked.Patient will come to office 03/06/12 to have bmet.

## 2012-03-05 NOTE — Progress Notes (Signed)
CBG 175 pre exercise 203 post exercise. Heart rate slightly elevated this afternoon. Intermittent PVC's noted during exercise frequent at times. Vital signs stable. Patient asymptomatic.  Dr Elvis Coil office called and notified. ECG tracings reviewed by [name] ANP. Lawson Fiscal requested that patient come by the office tomorrow to have a BMET drawn. Patient aware and left cardiac rehab without complaints.

## 2012-03-06 ENCOUNTER — Other Ambulatory Visit (INDEPENDENT_AMBULATORY_CARE_PROVIDER_SITE_OTHER): Payer: BC Managed Care – PPO

## 2012-03-06 DIAGNOSIS — I2510 Atherosclerotic heart disease of native coronary artery without angina pectoris: Secondary | ICD-10-CM

## 2012-03-07 ENCOUNTER — Other Ambulatory Visit: Payer: Self-pay | Admitting: *Deleted

## 2012-03-07 ENCOUNTER — Telehealth: Payer: Self-pay | Admitting: Cardiology

## 2012-03-07 ENCOUNTER — Encounter (HOSPITAL_COMMUNITY)
Admission: RE | Admit: 2012-03-07 | Discharge: 2012-03-07 | Disposition: A | Discharge status: Home / Self Care | Payer: BC Managed Care – PPO | Source: Ambulatory Visit | Attending: Cardiology | Admitting: Cardiology

## 2012-03-07 DIAGNOSIS — I5022 Chronic systolic (congestive) heart failure: Secondary | ICD-10-CM

## 2012-03-07 LAB — BASIC METABOLIC PANEL
BUN: 56 mg/dL — ABNORMAL HIGH (ref 6–23)
CO2: 31 mEq/L (ref 19–32)
Calcium: 9.9 mg/dL (ref 8.4–10.5)
Chloride: 97 mEq/L (ref 96–112)
Creatinine, Ser: 1.9 mg/dL — ABNORMAL HIGH (ref 0.4–1.2)
GFR: 33.78 mL/min — ABNORMAL LOW (ref >60.00)
Glucose, Bld: 147 mg/dL — ABNORMAL HIGH (ref 70–99)
Potassium: 4.3 mEq/L (ref 3.5–5.1)
Sodium: 137 mEq/L (ref 135–145)

## 2012-03-07 LAB — GLUCOSE, CAPILLARY
Glucose-Capillary: 249 mg/dL — ABNORMAL HIGH (ref 70–99)
Glucose-Capillary: 222 mg/dL — ABNORMAL HIGH (ref 70–99)

## 2012-03-07 NOTE — Telephone Encounter (Signed)
Reviewed results and orders with pt who states understanding.

## 2012-03-07 NOTE — Telephone Encounter (Signed)
Fu call patient returning your call

## 2012-03-10 ENCOUNTER — Encounter (HOSPITAL_COMMUNITY): Payer: BC Managed Care – PPO

## 2012-03-12 ENCOUNTER — Encounter (HOSPITAL_COMMUNITY): Payer: BC Managed Care – PPO

## 2012-03-13 ENCOUNTER — Ambulatory Visit (INDEPENDENT_AMBULATORY_CARE_PROVIDER_SITE_OTHER): Payer: BC Managed Care – PPO | Admitting: *Deleted

## 2012-03-13 ENCOUNTER — Other Ambulatory Visit (INDEPENDENT_AMBULATORY_CARE_PROVIDER_SITE_OTHER): Payer: BC Managed Care – PPO

## 2012-03-13 ENCOUNTER — Encounter: Payer: Self-pay | Admitting: Internal Medicine

## 2012-03-13 DIAGNOSIS — I259 Chronic ischemic heart disease, unspecified: Secondary | ICD-10-CM

## 2012-03-13 DIAGNOSIS — I5022 Chronic systolic (congestive) heart failure: Secondary | ICD-10-CM

## 2012-03-13 LAB — ICD DEVICE OBSERVATION
BATTERY VOLTAGE: 3.09 V
CHARGE TIME: 8.65 s
FVT: 0
RV LEAD AMPLITUDE: 12.8 mv
RV LEAD IMPEDENCE ICD: 592 Ohm
TZAT-0001SLOWVT: 1
TZAT-0001SLOWVT: 2
TZAT-0004FASTVT: 8
TZAT-0004SLOWVT: 8
TZAT-0004SLOWVT: 8
TZAT-0005FASTVT: 88 pct
TZAT-0005SLOWVT: 84 pct
TZAT-0005SLOWVT: 91 pct
TZAT-0011FASTVT: 10 ms
TZAT-0011SLOWVT: 10 ms
TZAT-0011SLOWVT: 10 ms
TZAT-0012FASTVT: 200 ms
TZAT-0013SLOWVT: 2
TZON-0003FASTVT: 240 ms
TZON-0004SLOWVT: 16
TZON-0008FASTVT: 0 ms
TZON-0011AFLUTTER: 70
TZST-0001FASTVT: 3
TZST-0001FASTVT: 5
TZST-0001FASTVT: 6
TZST-0001SLOWVT: 3
TZST-0001SLOWVT: 6
TZST-0003FASTVT: 25 J
TZST-0003FASTVT: 35 J
TZST-0003FASTVT: 35 J
TZST-0003SLOWVT: 35 J
VF: 0

## 2012-03-13 LAB — BASIC METABOLIC PANEL
CO2: 27 mEq/L (ref 19–32)
Calcium: 10.6 mg/dL — ABNORMAL HIGH (ref 8.4–10.5)
Creatinine, Ser: 1.9 mg/dL — ABNORMAL HIGH (ref 0.4–1.2)
GFR: 34.39 mL/min — ABNORMAL LOW (ref >60.00)

## 2012-03-13 NOTE — Progress Notes (Signed)
ICD check 

## 2012-03-14 ENCOUNTER — Encounter (HOSPITAL_COMMUNITY)
Admission: RE | Admit: 2012-03-14 | Discharge: 2012-03-14 | Disposition: A | Discharge status: Home / Self Care | Payer: BC Managed Care – PPO | Source: Ambulatory Visit | Attending: Cardiology | Admitting: Cardiology

## 2012-03-14 ENCOUNTER — Telehealth: Payer: Self-pay | Admitting: Cardiology

## 2012-03-14 LAB — GLUCOSE, CAPILLARY: Glucose-Capillary: 242 mg/dL — ABNORMAL HIGH (ref 70–99)

## 2012-03-14 NOTE — Telephone Encounter (Signed)
FU Call: Pt returning call from our office regarding pt recent lab results. Please return pt call to discuss further.

## 2012-03-14 NOTE — Telephone Encounter (Signed)
Patient called lab results given. 

## 2012-03-17 ENCOUNTER — Encounter (HOSPITAL_COMMUNITY)
Admission: RE | Admit: 2012-03-17 | Discharge: 2012-03-17 | Disposition: A | Discharge status: Home / Self Care | Payer: BC Managed Care – PPO | Source: Ambulatory Visit | Attending: Gastroenterology | Admitting: Gastroenterology

## 2012-03-17 ENCOUNTER — Encounter (HOSPITAL_COMMUNITY): Payer: BC Managed Care – PPO

## 2012-03-17 ENCOUNTER — Encounter (HOSPITAL_COMMUNITY): Payer: Self-pay

## 2012-03-17 DIAGNOSIS — R1310 Dysphagia, unspecified: Secondary | ICD-10-CM | POA: Insufficient documentation

## 2012-03-17 DIAGNOSIS — R6881 Early satiety: Secondary | ICD-10-CM | POA: Insufficient documentation

## 2012-03-17 DIAGNOSIS — R112 Nausea with vomiting, unspecified: Secondary | ICD-10-CM | POA: Insufficient documentation

## 2012-03-17 DIAGNOSIS — R110 Nausea: Secondary | ICD-10-CM

## 2012-03-17 DIAGNOSIS — E119 Type 2 diabetes mellitus without complications: Secondary | ICD-10-CM | POA: Insufficient documentation

## 2012-03-17 MED ORDER — TECHNETIUM TC 99M SULFUR COLLOID
1.9000 | Freq: Once | INTRAVENOUS | Status: AC | PRN
  Administered 2012-03-17: 1.9 via ORAL

## 2012-03-18 ENCOUNTER — Ambulatory Visit: Payer: BC Managed Care – PPO | Admitting: Cardiology

## 2012-03-19 ENCOUNTER — Ambulatory Visit (HOSPITAL_COMMUNITY)
Admission: RE | Admit: 2012-03-19 | Discharge: 2012-03-19 | Disposition: A | Discharge status: Home / Self Care | Payer: BC Managed Care – PPO | Source: Ambulatory Visit | Attending: Family Medicine | Admitting: Family Medicine

## 2012-03-19 ENCOUNTER — Encounter (HOSPITAL_COMMUNITY)
Admission: RE | Admit: 2012-03-19 | Discharge: 2012-03-19 | Disposition: A | Discharge status: Home / Self Care | Payer: BC Managed Care – PPO | Source: Ambulatory Visit | Attending: Cardiology | Admitting: Cardiology

## 2012-03-19 ENCOUNTER — Other Ambulatory Visit (HOSPITAL_COMMUNITY): Payer: Self-pay | Admitting: Family Medicine

## 2012-03-19 DIAGNOSIS — M25579 Pain in unspecified ankle and joints of unspecified foot: Secondary | ICD-10-CM | POA: Insufficient documentation

## 2012-03-19 DIAGNOSIS — T148XXA Other injury of unspecified body region, initial encounter: Secondary | ICD-10-CM

## 2012-03-19 DIAGNOSIS — X58XXXA Exposure to other specified factors, initial encounter: Secondary | ICD-10-CM | POA: Insufficient documentation

## 2012-03-19 DIAGNOSIS — M7989 Other specified soft tissue disorders: Secondary | ICD-10-CM | POA: Insufficient documentation

## 2012-03-19 LAB — GLUCOSE, CAPILLARY: Glucose-Capillary: 200 mg/dL — ABNORMAL HIGH (ref 70–99)

## 2012-03-19 NOTE — Progress Notes (Signed)
Andrea [name] returned to exercise today. Andrea [name] said she heard a crack in her ear as she tried to get on the airdyne to exercise.  Andrea [name] reported a pain in her right ankle.  Upon inspection no swelling noted. Patient assisted to a chair foot elevated exercise stopped.  Dr Severiano Gilbert called and notified of patients complaints. Dr Katrinka Blazing ordered an ankle xray. Patient taken to Rdiology had the xray completed.  Patient taken to car. Significant other Andrea [name] drove the patient home.  Dr Katrinka Blazing said the patient could go home after the xray was completed. Exit hr 97.  Blood pressure 104/60.

## 2012-03-21 ENCOUNTER — Telehealth (HOSPITAL_COMMUNITY): Payer: Self-pay | Admitting: *Deleted

## 2012-03-24 NOTE — Progress Notes (Addendum)
Cardiac Rehabilitation Program Progress Report   Orientation:  01/10/2012  Discharge Date:  03/21/2012 # of sessions completed: 4/29  Cardiologist: [place] Family MD:  Luther Redo Time:  1445  A.  Exercise Program:  Tolerates exercise @ 2.1 METS for 30 minutes, Bike Test Results:  Pre: 0.68 mile, Poor attendance due to lack of motivation and noncompliance, Needs encouragement on exercise program and Discharged to home exercise program.  Anticipated compliance:  poor  B.  Mental Health:  Good mental attitude and Quality of Life (QOL) Pre Scores Only:  Overall  13.39, Health/Functioning 10.03, Socioeconomics 20.21, Psych/Spiritual 4.00, Family 25.20    C.  Education/Instruction/Skills  Reviwed how to check own pulse.  Rest:  84  Exercise:  134, Knows THR for exercise, Uses Perceived Exertion Scale and/or Dyspnea Scale and Attended 2/15 education classes  Home exercise given: 03/05/2012  Nutrition Addended Later  F.  Lifestyle Changes:  Does not take medications as prescribed and Needs physician encouragement on making lifestyle changes  G.  Symptoms noted with exercise:  Hyperglycemic  Report Completed By:  Hazle Nordmann   Comments:  Pt was dropped from program after spraining her ankle and MD request to staff off her feet for a few weeks.  Pt was non-compliant with medications which held up her start.  She only had a MET level of 2.1 at the end of 4 sessions.  She will need encouragement on her exercise and medication compliance, especially her diabetic medications.  At d/c pt was in sinus rhythm. Thanks for the referral. Fabio Pierce, MA, ACSM RCEP

## 2012-04-08 NOTE — Progress Notes (Signed)
Addendum to Nutrition Section of Cardiac Rehab Program Progress Report  Pt following a step 2 Therapeutic Lifestyle Changes diet on admission to Cardiac Rehab according to [employer] survey. Pt needs continued reinforcement of DM SMBG.

## 2012-04-21 ENCOUNTER — Ambulatory Visit: Payer: BC Managed Care – PPO | Admitting: Cardiology

## 2012-04-21 ENCOUNTER — Ambulatory Visit (HOSPITAL_COMMUNITY): Payer: BC Managed Care – PPO

## 2012-04-23 ENCOUNTER — Ambulatory Visit (HOSPITAL_COMMUNITY): Payer: BC Managed Care – PPO

## 2012-04-25 ENCOUNTER — Ambulatory Visit (HOSPITAL_COMMUNITY): Payer: BC Managed Care – PPO

## 2012-04-28 ENCOUNTER — Ambulatory Visit (HOSPITAL_COMMUNITY): Payer: BC Managed Care – PPO

## 2012-04-30 ENCOUNTER — Ambulatory Visit (HOSPITAL_COMMUNITY): Payer: BC Managed Care – PPO

## 2012-05-02 ENCOUNTER — Ambulatory Visit (HOSPITAL_COMMUNITY): Payer: BC Managed Care – PPO

## 2012-05-05 ENCOUNTER — Ambulatory Visit (HOSPITAL_COMMUNITY): Payer: BC Managed Care – PPO

## 2012-05-07 ENCOUNTER — Ambulatory Visit (HOSPITAL_COMMUNITY): Payer: BC Managed Care – PPO

## 2012-05-09 ENCOUNTER — Ambulatory Visit (HOSPITAL_COMMUNITY): Payer: BC Managed Care – PPO

## 2012-05-12 ENCOUNTER — Ambulatory Visit (HOSPITAL_COMMUNITY): Payer: BC Managed Care – PPO

## 2012-05-14 ENCOUNTER — Ambulatory Visit (HOSPITAL_COMMUNITY): Payer: BC Managed Care – PPO

## 2012-05-15 NOTE — Progress Notes (Signed)
Agree with progress report from cardiac rehab on 03/24/2012 and 04/08/2012 written by Trevor Mace and and Heloise Purpura

## 2012-05-16 ENCOUNTER — Ambulatory Visit (HOSPITAL_COMMUNITY): Payer: BC Managed Care – PPO

## 2012-06-06 ENCOUNTER — Other Ambulatory Visit: Payer: Self-pay | Admitting: Cardiology

## 2012-06-06 NOTE — Telephone Encounter (Signed)
Refill- Lasix   Per [facility]   - [address] [phone]

## 2012-06-24 ENCOUNTER — Encounter: Payer: BC Managed Care – PPO | Admitting: Internal Medicine

## 2012-06-30 ENCOUNTER — Encounter: Payer: BC Managed Care – PPO | Admitting: Cardiology

## 2012-07-04 ENCOUNTER — Encounter: Payer: BC Managed Care – PPO | Admitting: Cardiology

## 2012-09-30 ENCOUNTER — Other Ambulatory Visit: Payer: Self-pay | Admitting: Gastroenterology

## 2012-09-30 ENCOUNTER — Encounter (HOSPITAL_COMMUNITY): Payer: Self-pay | Admitting: Emergency Medicine

## 2012-09-30 ENCOUNTER — Emergency Department (HOSPITAL_COMMUNITY)
Admission: EM | Admit: 2012-09-30 | Discharge: 2012-09-30 | Disposition: A | Discharge status: Nursing Home (Includes ICF) | Payer: BC Managed Care – PPO | Source: Home / Self Care | Attending: Family Medicine | Admitting: Family Medicine

## 2012-09-30 ENCOUNTER — Inpatient Hospital Stay (HOSPITAL_COMMUNITY)
Admission: EM | Admit: 2012-09-30 | Discharge: 2012-10-03 | DRG: 543 | Disposition: A | Discharge status: Home / Self Care | Payer: BC Managed Care – PPO | Attending: Internal Medicine | Admitting: Internal Medicine

## 2012-09-30 DIAGNOSIS — R7989 Other specified abnormal findings of blood chemistry: Secondary | ICD-10-CM | POA: Diagnosis present

## 2012-09-30 DIAGNOSIS — I2589 Other forms of chronic ischemic heart disease: Secondary | ICD-10-CM | POA: Diagnosis present

## 2012-09-30 DIAGNOSIS — I5022 Chronic systolic (congestive) heart failure: Secondary | ICD-10-CM | POA: Diagnosis present

## 2012-09-30 DIAGNOSIS — I2510 Atherosclerotic heart disease of native coronary artery without angina pectoris: Secondary | ICD-10-CM | POA: Diagnosis present

## 2012-09-30 DIAGNOSIS — T502X5A Adverse effect of carbonic-anhydrase inhibitors, benzothiadiazides and other diuretics, initial encounter: Secondary | ICD-10-CM | POA: Diagnosis present

## 2012-09-30 DIAGNOSIS — I959 Hypotension, unspecified: Principal | ICD-10-CM | POA: Diagnosis present

## 2012-09-30 DIAGNOSIS — Z87891 Personal history of nicotine dependence: Secondary | ICD-10-CM

## 2012-09-30 DIAGNOSIS — R12 Heartburn: Secondary | ICD-10-CM

## 2012-09-30 DIAGNOSIS — E860 Dehydration: Secondary | ICD-10-CM | POA: Diagnosis present

## 2012-09-30 DIAGNOSIS — I2789 Other specified pulmonary heart diseases: Secondary | ICD-10-CM | POA: Diagnosis present

## 2012-09-30 DIAGNOSIS — R112 Nausea with vomiting, unspecified: Secondary | ICD-10-CM

## 2012-09-30 DIAGNOSIS — R42 Dizziness and giddiness: Secondary | ICD-10-CM

## 2012-09-30 DIAGNOSIS — Z951 Presence of aortocoronary bypass graft: Secondary | ICD-10-CM

## 2012-09-30 DIAGNOSIS — I509 Heart failure, unspecified: Secondary | ICD-10-CM | POA: Diagnosis present

## 2012-09-30 DIAGNOSIS — E669 Obesity, unspecified: Secondary | ICD-10-CM | POA: Diagnosis present

## 2012-09-30 DIAGNOSIS — I420 Dilated cardiomyopathy: Secondary | ICD-10-CM

## 2012-09-30 DIAGNOSIS — I951 Orthostatic hypotension: Secondary | ICD-10-CM

## 2012-09-30 DIAGNOSIS — I129 Hypertensive chronic kidney disease with stage 1 through stage 4 chronic kidney disease, or unspecified chronic kidney disease: Secondary | ICD-10-CM | POA: Diagnosis present

## 2012-09-30 DIAGNOSIS — I10 Essential (primary) hypertension: Secondary | ICD-10-CM

## 2012-09-30 DIAGNOSIS — Z6839 Body mass index (BMI) 39.0-39.9, adult: Secondary | ICD-10-CM

## 2012-09-30 DIAGNOSIS — N189 Chronic kidney disease, unspecified: Secondary | ICD-10-CM | POA: Diagnosis present

## 2012-09-30 DIAGNOSIS — E785 Hyperlipidemia, unspecified: Secondary | ICD-10-CM

## 2012-09-30 DIAGNOSIS — Z95810 Presence of automatic (implantable) cardiac defibrillator: Secondary | ICD-10-CM | POA: Diagnosis present

## 2012-09-30 DIAGNOSIS — E119 Type 2 diabetes mellitus without complications: Secondary | ICD-10-CM | POA: Diagnosis present

## 2012-09-30 DIAGNOSIS — I493 Ventricular premature depolarization: Secondary | ICD-10-CM

## 2012-09-30 DIAGNOSIS — I428 Other cardiomyopathies: Secondary | ICD-10-CM

## 2012-09-30 DIAGNOSIS — Y92009 Unspecified place in unspecified non-institutional (private) residence as the place of occurrence of the external cause: Secondary | ICD-10-CM

## 2012-09-30 DIAGNOSIS — I259 Chronic ischemic heart disease, unspecified: Secondary | ICD-10-CM

## 2012-09-30 DIAGNOSIS — Z8673 Personal history of transient ischemic attack (TIA), and cerebral infarction without residual deficits: Secondary | ICD-10-CM

## 2012-09-30 DIAGNOSIS — N179 Acute kidney failure, unspecified: Secondary | ICD-10-CM | POA: Diagnosis present

## 2012-09-30 LAB — CBC
HCT: 37.6 % (ref 36.0–46.0)
MCH: 23 pg — ABNORMAL LOW (ref 26.0–34.0)
MCV: 69.1 fL — ABNORMAL LOW (ref 78.0–100.0)
RBC: 5.44 MIL/uL — ABNORMAL HIGH (ref 3.87–5.11)
WBC: 5.5 10*3/uL (ref 4.0–10.5)

## 2012-09-30 LAB — BASIC METABOLIC PANEL
CO2: 26 mEq/L (ref 19–32)
Calcium: 10.7 mg/dL — ABNORMAL HIGH (ref 8.4–10.5)
Chloride: 98 mEq/L (ref 96–112)
Creatinine, Ser: 2.57 mg/dL — ABNORMAL HIGH (ref 0.50–1.10)
Glucose, Bld: 112 mg/dL — ABNORMAL HIGH (ref 70–99)

## 2012-09-30 LAB — GLUCOSE, CAPILLARY: Glucose-Capillary: 133 mg/dL — ABNORMAL HIGH (ref 70–99)

## 2012-09-30 MED ORDER — SODIUM CHLORIDE 0.9 % IV BOLUS (SEPSIS)
250.0000 mL | Freq: Once | INTRAVENOUS | Status: AC
  Administered 2012-09-30: 250 mL via INTRAVENOUS

## 2012-09-30 MED ORDER — SODIUM CHLORIDE 0.9 % IV SOLN
Freq: Once | INTRAVENOUS | Status: AC
  Administered 2012-09-30: 100 mL/h via INTRAVENOUS

## 2012-09-30 MED ORDER — SODIUM CHLORIDE 0.9 % IV SOLN
Freq: Once | INTRAVENOUS | Status: AC
  Administered 2012-09-30: 50 mL/h via INTRAVENOUS

## 2012-09-30 NOTE — ED Notes (Signed)
PT bp decreasing to 80/palp after 250 bolus.  Dr Kathee Polite notified and stated to move pt to step-down.  Flow called. Pt continues to be ao x 4, non-diaphoretic.

## 2012-09-30 NOTE — ED Notes (Signed)
Pt. Stated, i started having dizziness at 1100 and the headache started around 1630. I was in the Dr's office.  Pt. Went to UC first and UC transported pt. To ED. Pt. Stated, I'm having a little of dizziness, some headache.

## 2012-09-30 NOTE — ED Provider Notes (Signed)
History     CSN: [id number]  Arrival date & time 09/30/12  1642   First MD Initiated Contact with Patient 09/30/12 1658      Chief Complaint  Patient presents with  . Dizziness  . Nausea    (Consider location/radiation/quality/duration/timing/severity/associated sxs/prior treatment) Patient is a 61 y.o. female presenting with neurologic complaint. The history is provided by the patient.  Neurologic Problem The primary symptoms include headaches, dizziness and nausea. The symptoms began 6 to 12 hours ago. The symptoms are unchanged. The symptoms occurred after standing up.  The headache is associated with weakness.  Dizziness also occurs with nausea and weakness.  Additional symptoms include weakness. Additional symptoms do not include vertigo. Associated symptoms comments: 2nd episode today at dr Randa Evens' office, being eval for persistent nausea, felt to be gerd, , to have andoscopy and mri upcoming.. Medical issues also include diabetes and hypertension.    Past Medical History  Diagnosis Date  . ISCHEMIC HEART DISEASE 02/14/2011  . Chronic systolic heart failure 02/14/2011  . AUTOMATIC IMPLANTABLE CARDIAC DEFIBRILLATOR SITU 02/14/2011  . Hypertension   . Stroke 02/2010    left brain CVA? recurrent TIA's treated with TPA  . Diabetes mellitus     insulin-dependent  . Dyslipidemia   . Premature ventricular contractions (PVCs) (VPCs)   . Arthritis   . Migraine headache   . GERD (gastroesophageal reflux disease)   . Coronary artery disease     stenting of right coronary artery. also diagonal stenosis - treated medically  . Hyperlipidemia   . Dilated cardiomyopathy     with an ejection fraction of around 15%  . Pulmonary hypertension   . Gout   . Obesity   . DJD (degenerative joint disease)   . Thrombocytopenia   . Depression     Past Surgical History  Procedure Date  . Cardiac catheterization   . Tonsillectomy   . Oophorectomy     right  . Mass excision     left  hip  . Cervical polypectomy   . Foot surgery   . US echocardiography 2011    Family History  Problem Relation Age of Onset  . Heart failure Mother   . Heart attack Father   . Heart disease Brother     History  Substance Use Topics  . Smoking status: Former Smoker -- 1 years    Types: Cigarettes    Quit date: 04/23/1982  . Smokeless tobacco: Never Used  . Alcohol Use: No    OB History    Grav Para Term Preterm Abortions TAB SAB Ect Mult Living                  Review of Systems  Cardiovascular: Negative for chest pain.  Gastrointestinal: Positive for nausea and abdominal pain.  Neurological: Positive for dizziness, weakness and headaches. Negative for vertigo.    Allergies  Percocet; Aspirin; Darvon; Imitrex; Nsaids; Propoxyphene hcl; and Ticagrelor  Home Medications   Current Outpatient Rx  Name Route Sig Dispense Refill  . ACETAMINOPHEN 500 MG PO TABS Oral Take 500 mg by mouth as needed.      . ATORVASTATIN CALCIUM 40 MG PO TABS Oral Take 1 tablet by mouth at bedtime.    . BD PEN NEEDLE ULTRAFINE 29G X 12.7MM MISC      . CITRACAL PO Oral Take 1 tablet by mouth 2 (two) times daily.      Marland Kitchen CARVEDILOL 25 MG PO TABS Oral Take 25 mg by mouth  2 (two) times daily with a meal. 1/2 tablet twice a day.    . DULOXETINE HCL 60 MG PO CPEP Oral Take 60 mg by mouth daily.      Marland Kitchen ESOMEPRAZOLE MAGNESIUM 40 MG PO CPDR Oral Take 40 mg by mouth daily before breakfast.    . FEBUXOSTAT 40 MG PO TABS Oral Take 80 mg by mouth daily.      . FENOFIBRATE 145 MG PO TABS Oral Take 1 tablet (145 mg total) by mouth daily. 30 tablet 11  . FUROSEMIDE 40 MG PO TABS Oral Take 40 mg by mouth daily.    . FUROSEMIDE 40 MG PO TABS  TAKE ONE TABLET BY MOUTH TWICE DAILY 60 tablet 3  . INSULIN GLARGINE 100 UNIT/ML  SOLN Subcutaneous Inject 84 Units into the skin as directed.     Marland Kitchen JANUVIA 100 MG PO TABS Oral Take 1 tablet by mouth Daily.    Marland Kitchen NITROGLYCERIN 0.4 MG SL SUBL Sublingual Place 0.4 mg under  the tongue every 5 (five) minutes as needed.      Marland Kitchen OLMESARTAN MEDOXOMIL 40 MG PO TABS Oral Take 40 mg by mouth daily. 1/2 tablet daily    . ONETOUCH ULTRA BLUE VI STRP  as directed.    . SUCRALFATE 1 G PO TABS      . TICAGRELOR 90 MG PO TABS Oral Take 1 tablet (90 mg total) by mouth 2 (two) times daily. 180 tablet 3    BP 102/67  Pulse 79  Temp 97.9 F (36.6 C) (Oral)  Resp 22  SpO2 98%  Physical Exam  Nursing note and vitals reviewed. Constitutional: She is oriented to person, place, and time. She appears well-developed and well-nourished.  HENT:  Head: Normocephalic.  Right Ear: External ear normal.  Left Ear: External ear normal.  Mouth/Throat: Oropharynx is clear and moist.  Eyes: Pupils are equal, round, and reactive to light.  Neck: Normal range of motion. Neck supple. No JVD present.  Cardiovascular: Normal rate, normal heart sounds and intact distal pulses.   Pulmonary/Chest: Effort normal and breath sounds normal.    Abdominal: Soft. Bowel sounds are normal. There is tenderness in the epigastric area and left upper quadrant.  Musculoskeletal: She exhibits no edema.  Lymphadenopathy:    She has no cervical adenopathy.  Neurological: She is alert and oriented to person, place, and time.  Skin: Skin is warm and dry.    ED Course  Procedures (including critical care time)  Labs Reviewed - No data to display No results found.   1. Autonomic orthostatic hypotension       MDM          Linna Hoff, MD 09/30/12 1739

## 2012-09-30 NOTE — ED Notes (Signed)
Dr Toniann Fail notified of pt bp 85/44, though asymptomatic.  Dr Reino Kent stated to give 250 ml bolus.  Pt ambulate to bathroom with assistance and stated minimal dizziness.

## 2012-09-30 NOTE — ED Provider Notes (Signed)
History     CSN: [id number]  Arrival date & time 09/30/12  1813   First MD Initiated Contact with Patient 09/30/12 1833      Chief Complaint  Patient presents with  . Dizziness    (Consider location/radiation/quality/duration/timing/severity/associated sxs/prior treatment) HPI Comments: Pt with intermittent episodes of dizziness. She is on two BP meds and has been decreasing the dose at advise of her PCP. Pt states that she has documented several pressures throughout this time at home in the 80s.  Patient is a 61 y.o. female presenting with general illness. The history is provided by the patient.  Illness  The current episode started more than 1 week ago. The problem occurs occasionally. The problem has been resolved. The problem is mild. Relieved by: sitting down. The symptoms are aggravated by activity and movement. Associated symptoms include headaches. Pertinent negatives include no fever, no abdominal pain, no diarrhea, no nausea and no cough.    Past Medical History  Diagnosis Date  . ISCHEMIC HEART DISEASE 02/14/2011  . Chronic systolic heart failure 02/14/2011  . AUTOMATIC IMPLANTABLE CARDIAC DEFIBRILLATOR SITU 02/14/2011  . Hypertension   . Stroke 02/2010    left brain CVA? recurrent TIA's treated with TPA  . Diabetes mellitus     insulin-dependent  . Dyslipidemia   . Premature ventricular contractions (PVCs) (VPCs)   . Arthritis   . Migraine headache   . GERD (gastroesophageal reflux disease)   . Coronary artery disease     stenting of right coronary artery. also diagonal stenosis - treated medically  . Hyperlipidemia   . Dilated cardiomyopathy     with an ejection fraction of around 15%  . Pulmonary hypertension   . Gout   . Obesity   . DJD (degenerative joint disease)   . Thrombocytopenia   . Depression     Past Surgical History  Procedure Date  . Cardiac catheterization   . Tonsillectomy   . Oophorectomy     right  . Mass excision     left hip  .  Cervical polypectomy   . Foot surgery   . US echocardiography 2011    Family History  Problem Relation Age of Onset  . Heart failure Mother   . Heart attack Father   . Heart disease Brother     History  Substance Use Topics  . Smoking status: Former Smoker -- 1 years    Types: Cigarettes    Quit date: 04/23/1982  . Smokeless tobacco: Never Used  . Alcohol Use: No    OB History    Grav Para Term Preterm Abortions TAB SAB Ect Mult Living                  Review of Systems  Constitutional: Negative for fever.  Respiratory: Negative for cough and shortness of breath.   Cardiovascular: Negative for chest pain.  Gastrointestinal: Negative for nausea, abdominal pain and diarrhea.  Neurological: Positive for dizziness, light-headedness and headaches.  All other systems reviewed and are negative.    Allergies  Aspirin; Percocet; Darvon; Imitrex; and Nsaids  Home Medications   Current Outpatient Rx  Name Route Sig Dispense Refill  . ACETAMINOPHEN 500 MG PO TABS Oral Take 500 mg by mouth 3 (three) times daily as needed. For pain    . ATORVASTATIN CALCIUM 40 MG PO TABS Oral Take 1 tablet by mouth at bedtime.    Marland Kitchen CARVEDILOL 25 MG PO TABS Oral Take 12.5 mg by mouth 2 (two) times  daily with a meal.     . DULOXETINE HCL 60 MG PO CPEP Oral Take 60 mg by mouth daily.      . FENOFIBRATE 145 MG PO TABS Oral Take 145 mg by mouth daily.    . FUROSEMIDE 40 MG PO TABS Oral Take 40 mg by mouth 2 (two) times daily.     . INSULIN GLARGINE 100 UNIT/ML [place] SOLN Subcutaneous Inject 62 Units into the skin every morning.     Marland Kitchen JANUVIA 100 MG PO TABS Oral Take 1 tablet by mouth Daily.    Marland Kitchen NITROGLYCERIN 0.4 MG SL SUBL Sublingual Place 0.4 mg under the tongue every 5 (five) minutes as needed. For chest pain    . OLMESARTAN MEDOXOMIL 40 MG PO TABS Oral Take 20 mg by mouth daily.     Marland Kitchen PANTOPRAZOLE SODIUM 40 MG PO TBEC Oral Take 40 mg by mouth daily.    Marland Kitchen TICAGRELOR 90 MG PO TABS Oral Take 90 mg  by mouth 2 (two) times daily.      BP 106/66  Pulse 92  Resp 20  SpO2 100%  Physical Exam  Nursing note and vitals reviewed. Constitutional: She is oriented to person, place, and time. She appears well-developed and well-nourished. No distress.  HENT:  Head: Normocephalic and atraumatic.  Eyes: EOM are normal. Pupils are equal, round, and reactive to light.  Neck: Normal range of motion.  Cardiovascular: Normal rate and normal heart sounds.   Pulmonary/Chest: Effort normal and breath sounds normal. No respiratory distress.  Abdominal: Soft. She exhibits no distension. There is no tenderness.  Musculoskeletal: Normal range of motion.  Neurological: She is alert and oriented to person, place, and time. No cranial nerve deficit. She exhibits normal muscle tone. Coordination normal.  Skin: Skin is warm and dry.    ED Course  Procedures (including critical care time)  Labs Reviewed  BASIC METABOLIC PANEL - Abnormal; Notable for the following:    Glucose, Bld 112 (*)     BUN 72 (*)     Creatinine, Ser 2.57 (*)     Calcium 10.7 (*)     GFR calc non Af Amer 19 (*)     GFR calc Af Amer 22 (*)     All other components within normal limits   No results found.   1. Dizziness   2. ARF (acute renal failure)   3. Dilated cardiomyopathy   4. Hypotension       MDM  Pt sent from [facility] for two episodes of dizziness associated with borderline hypotension. Pt with severe heart failure (EF 15%) and is on beta-blocker and lasix because of this. Concern that patient may be dropping pressures due to these medicines. Will get bmp to look at kidney function as well which could be causing some of these problems.   Pt found to be in acute on chronic renal failure. Due to HF, do not want fast re-hydration. Will start on slow rate and admit to medicine.         Daleen Bo, MD 10/01/12 [phone]

## 2012-09-30 NOTE — ED Notes (Signed)
IV team called to start IV, unable to locate site. Pt is hypotensive.

## 2012-09-30 NOTE — ED Notes (Signed)
Pt brought back urgent for c/o x 2 episodes of dizziness starting this am post taking bp meds.husband states she was fine when he left for work but c/o feeling faint and nausea.denies CP OR vomiting.bp 102/67 but was lower at home.pt scheduled for endoscopy and MRI NEXT WEEK

## 2012-09-30 NOTE — ED Notes (Signed)
Report given to Steward Drone, Charity fundraiser.  Iv team here to start IV.

## 2012-09-30 NOTE — H&P (Signed)
Andrea [name] is an 61 y.o. female.   Patient was seen and examined on September 30, 2012. PCP - Dr. Elias Else. Cardiologist - Dr. Peter [place]. Chief Complaint: Dizziness. HPI: 61 year-old female with history of ischemic cardiomyopathy last EF measured in 2011 was 15%, CAD status post stenting, previous CVA, diabetes mellitus2 started experiencing dizziness today morning. Dizziness is only on standing and walking. Does not happen when she takes rest and on lying. Denies any headache or nausea vomiting diarrhea fever chills. She actually was scheduled to go to gastroenterology consult for further workup on her heartburn which she gets off and on. After the gastroenterology consult patient was found to be dizzy and blood pressure was found be low. Patient was transferred to ER. In the ER patient continued to be having low blood pressure in the high 80 systolic. Patient states that over the last 2-3 days she has been running low blood pressure. At this time patient will be admitted for further management. In addition her creatinine which usually is around 1.9 has increased to 2.5. Patient otherwise is asymptomatic.  Past Medical History  Diagnosis Date  . ISCHEMIC HEART DISEASE 02/14/2011  . Chronic systolic heart failure 02/14/2011  . AUTOMATIC IMPLANTABLE CARDIAC DEFIBRILLATOR SITU 02/14/2011  . Hypertension   . Stroke 02/2010    left brain CVA? recurrent TIA's treated with TPA  . Diabetes mellitus     insulin-dependent  . Dyslipidemia   . Premature ventricular contractions (PVCs) (VPCs)   . Arthritis   . Migraine headache   . GERD (gastroesophageal reflux disease)   . Coronary artery disease     stenting of right coronary artery. also diagonal stenosis - treated medically  . Hyperlipidemia   . Dilated cardiomyopathy     with an ejection fraction of around 15%  . Pulmonary hypertension   . Gout   . Obesity   . DJD (degenerative joint disease)   . Thrombocytopenia   . Depression      Past Surgical History  Procedure Date  . Cardiac catheterization   . Tonsillectomy   . Oophorectomy     right  . Mass excision     left hip  . Cervical polypectomy   . Foot surgery   . US echocardiography 2011    Family History  Problem Relation Age of Onset  . Heart failure Mother   . Heart attack Father   . Heart disease Brother    Social History:  reports that she quit smoking about 30 years ago. Her smoking use included Cigarettes. She quit after 1 year of use. She has never used smokeless tobacco. She reports that she does not drink alcohol or use illicit drugs.  Allergies:  Allergies  Allergen Reactions  . Aspirin Shortness Of Breath and Other (See Comments)    Asthma symptoms  . Percocet (Oxycodone-Acetaminophen) Nausea And Vomiting  . Darvon Nausea And Vomiting  . Imitrex (Sumatriptan Base) Other (See Comments)    Unknown reaction  . Nsaids Nausea And Vomiting     (Not in a hospital admission)  Results for orders placed during the hospital encounter of 09/30/12 (from the past 48 hour(s))  BASIC METABOLIC PANEL     Status: Abnormal   Collection Time   09/30/12  7:32 PM      Component Value Range Comment   Sodium 139  135 - 145 mEq/L    Potassium 3.8  3.5 - 5.1 mEq/L    Chloride 98  96 - 112  mEq/L    CO2 26  19 - 32 mEq/L    Glucose, Bld 112 (*) 70 - 99 mg/dL    BUN 72 (*) 6 - 23 mg/dL    Creatinine, Ser 1.61 (*) 0.50 - 1.10 mg/dL    Calcium 09.6 (*) 8.4 - 10.5 mg/dL    GFR calc non Af Amer 19 (*) >90 mL/min    GFR calc Af Amer 22 (*) >90 mL/min   CBC     Status: Abnormal   Collection Time   09/30/12 10:00 PM      Component Value Range Comment   WBC 5.5  4.0 - 10.5 K/uL    RBC 5.44 (*) 3.87 - 5.11 MIL/uL    Hemoglobin 12.5  12.0 - 15.0 g/dL    HCT 04.5  40.9 - 81.1 %    MCV 69.1 (*) 78.0 - 100.0 fL    MCH 23.0 (*) 26.0 - 34.0 pg    MCHC 33.2  30.0 - 36.0 g/dL    RDW 91.4 (*) 78.2 - 15.5 %    Platelets 158  150 - 400 K/uL   TROPONIN I      Status: Normal   Collection Time   09/30/12 10:00 PM      Component Value Range Comment   Troponin I <0.30  <0.30 ng/mL    No results found.  Review of Systems  HENT: Negative.   Eyes: Negative.   Respiratory: Negative.   Cardiovascular: Negative.   Gastrointestinal: Negative.   Genitourinary: Negative.   Musculoskeletal: Negative.   Skin: Negative.   Neurological: Positive for dizziness and weakness.  Endo/Heme/Allergies: Negative.   Psychiatric/Behavioral: Negative.     Blood pressure 109/51, pulse 84, temperature 97.5 F (36.4 C), temperature source Oral, resp. rate 18, SpO2 99.00%. Physical Exam  Constitutional: She is oriented to person, place, and time. She appears well-developed and well-nourished. No distress.  HENT:  Head: Normocephalic and atraumatic.  Right Ear: External ear normal.  Left Ear: External ear normal.  Nose: Nose normal.  Mouth/Throat: Oropharynx is clear and moist. No oropharyngeal exudate.  Eyes: Conjunctivae normal are normal. Pupils are equal, round, and reactive to light. Right eye exhibits no discharge. Left eye exhibits no discharge. No scleral icterus.  Neck: Normal range of motion. Neck supple.  Cardiovascular: Normal rate and regular rhythm.   Respiratory: Effort normal and breath sounds normal. No respiratory distress. She has no wheezes. She has no rales.  GI: Soft. Bowel sounds are normal. She exhibits no distension. There is no tenderness. There is no rebound and no guarding.  Musculoskeletal: Normal range of motion. She exhibits no edema and no tenderness.  Neurological: She is alert and oriented to person, place, and time.       Moves all extremities.  Skin: She is not diaphoretic.     Assessment/Plan #1. Hypotension with dizziness - patient's symptoms are most likely secondary to hypotension. Patient's hypotension is most likely from dehydration secondary to Lasix and also be antihypertensives. Patient otherwise is afebrile and has  no signs of infection or sepsis. At this time we will hold off her Lasix and antihypertensives. Gently hydrate careful not over hydrate due to patient having ischemic cardiomyopathy. #2. Acute renal failure on chronic kidney disease - probably combination of dehydration and hypotension causing the worsening of her creatinine. Check urinalysis. Patient's kidney function will probably improve with gentle hydration and holding of her antihypertensives for now. #3. CAD status post CABG and ischemic cardiomyopathy - patient  denies any chest pain now and has been having off and on atypical burning sensation in chest. She actually is being worked up for GERD and scheduled for EGD in the near future. At this time given her history of CAD we will cycle cardiac markers. #4. Diabetes mellitus type 2 - continue home medications with sliding-scale coverage. Closely follow CBG. #5. History of CVA/TIA.  CODE STATUS - full code.  Belinda Schlichting N. 09/30/2012, 11:40 PM

## 2012-10-01 DIAGNOSIS — R12 Heartburn: Secondary | ICD-10-CM | POA: Insufficient documentation

## 2012-10-01 DIAGNOSIS — R7989 Other specified abnormal findings of blood chemistry: Secondary | ICD-10-CM

## 2012-10-01 DIAGNOSIS — I259 Chronic ischemic heart disease, unspecified: Secondary | ICD-10-CM

## 2012-10-01 DIAGNOSIS — E860 Dehydration: Secondary | ICD-10-CM | POA: Diagnosis present

## 2012-10-01 LAB — COMPREHENSIVE METABOLIC PANEL
BUN: 70 mg/dL — ABNORMAL HIGH (ref 6–23)
CO2: 24 mEq/L (ref 19–32)
Calcium: 10.2 mg/dL (ref 8.4–10.5)
Chloride: 101 mEq/L (ref 96–112)
Creatinine, Ser: 2.07 mg/dL — ABNORMAL HIGH (ref 0.50–1.10)
GFR calc non Af Amer: 25 mL/min — ABNORMAL LOW (ref >90)
Total Bilirubin: 0.3 mg/dL (ref 0.3–1.2)

## 2012-10-01 LAB — BASIC METABOLIC PANEL
Chloride: 101 mEq/L (ref 96–112)
GFR calc Af Amer: 30 mL/min — ABNORMAL LOW (ref >90)
GFR calc non Af Amer: 26 mL/min — ABNORMAL LOW (ref >90)
Potassium: 4.1 mEq/L (ref 3.5–5.1)
Sodium: 140 mEq/L (ref 135–145)

## 2012-10-01 LAB — GLUCOSE, CAPILLARY
Glucose-Capillary: 156 mg/dL — ABNORMAL HIGH (ref 70–99)
Glucose-Capillary: 153 mg/dL — ABNORMAL HIGH (ref 70–99)

## 2012-10-01 LAB — CBC WITH DIFFERENTIAL/PLATELET
Basophils Relative: 1 % (ref 0–1)
Eosinophils Absolute: 0.1 10*3/uL (ref 0.0–0.7)
HCT: 36.7 % (ref 36.0–46.0)
Hemoglobin: 12.3 g/dL (ref 12.0–15.0)
Lymphocytes Relative: 45 % (ref 12–46)
Lymphs Abs: 1.9 10*3/uL (ref 0.7–4.0)
MCHC: 33.5 g/dL (ref 30.0–36.0)
MCV: 68.6 fL — ABNORMAL LOW (ref 78.0–100.0)
Monocytes Relative: 8 % (ref 3–12)
Neutro Abs: 1.7 10*3/uL (ref 1.7–7.7)
RDW: 16.8 % — ABNORMAL HIGH (ref 11.5–15.5)

## 2012-10-01 LAB — URINALYSIS, ROUTINE W REFLEX MICROSCOPIC
Glucose, UA: NEGATIVE mg/dL
Hgb urine dipstick: NEGATIVE
Ketones, ur: NEGATIVE mg/dL
Leukocytes, UA: NEGATIVE
Protein, ur: NEGATIVE mg/dL
pH: 5.5 (ref 5.0–8.0)

## 2012-10-01 LAB — TROPONIN I: Troponin I: <0.3 ng/mL (ref <0.30)

## 2012-10-01 LAB — MRSA PCR SCREENING: MRSA by PCR: NEGATIVE

## 2012-10-01 MED ORDER — CARVEDILOL 12.5 MG PO TABS
12.5000 mg | ORAL_TABLET | Freq: Two times a day (BID) | ORAL | Status: DC
  Administered 2012-10-01: 12.5 mg via ORAL
  Filled 2012-10-01 (×3): qty 1

## 2012-10-01 MED ORDER — INFLUENZA VIRUS VACC SPLIT PF IM SUSP: 0.5000 mL | INTRAMUSCULAR | Status: DC | PRN

## 2012-10-01 MED ORDER — INSULIN ASPART 100 UNIT/ML ~~LOC~~ SOLN
0.0000 [IU] | Freq: Three times a day (TID) | SUBCUTANEOUS | Status: DC
  Administered 2012-10-01: 2 [IU] via SUBCUTANEOUS
  Administered 2012-10-01: 3 [IU] via SUBCUTANEOUS
  Administered 2012-10-01: 2 [IU] via SUBCUTANEOUS

## 2012-10-01 MED ORDER — ACETAMINOPHEN 650 MG RE SUPP: 650.0000 mg | Freq: Four times a day (QID) | RECTAL | Status: DC | PRN

## 2012-10-01 MED ORDER — SODIUM CHLORIDE 0.9 % IJ SOLN
3.0000 mL | Freq: Two times a day (BID) | INTRAMUSCULAR | Status: DC
  Administered 2012-10-02 (×2): 3 mL via INTRAVENOUS

## 2012-10-01 MED ORDER — PANTOPRAZOLE SODIUM 40 MG PO TBEC
40.0000 mg | DELAYED_RELEASE_TABLET | Freq: Every day | ORAL | Status: DC
Start: 2012-10-01 — End: 2012-10-03
  Administered 2012-10-01 – 2012-10-03 (×3): 40 mg via ORAL
  Filled 2012-10-01 (×3): qty 1

## 2012-10-01 MED ORDER — SODIUM CHLORIDE 0.9 % IV SOLN
INTRAVENOUS | Status: AC
  Administered 2012-10-02: 09:00:00 via INTRAVENOUS

## 2012-10-01 MED ORDER — INFLUENZA VIRUS VACC SPLIT PF IM SUSP: 0.5000 mL | INTRAMUSCULAR | Status: DC

## 2012-10-01 MED ORDER — INSULIN GLARGINE 100 UNIT/ML ~~LOC~~ SOLN
62.0000 [IU] | Freq: Every morning | SUBCUTANEOUS | Status: DC
  Administered 2012-10-01 – 2012-10-03 (×3): 62 [IU] via SUBCUTANEOUS

## 2012-10-01 MED ORDER — HEPARIN SODIUM (PORCINE) 5000 UNIT/ML IJ SOLN
5000.0000 [IU] | Freq: Three times a day (TID) | INTRAMUSCULAR | Status: DC
  Administered 2012-10-01 – 2012-10-03 (×7): 5000 [IU] via SUBCUTANEOUS
  Filled 2012-10-01 (×11): qty 1

## 2012-10-01 MED ORDER — FENOFIBRATE 160 MG PO TABS
160.0000 mg | ORAL_TABLET | Freq: Every day | ORAL | Status: DC
  Administered 2012-10-01 – 2012-10-03 (×3): 160 mg via ORAL
  Filled 2012-10-01 (×3): qty 1

## 2012-10-01 MED ORDER — SODIUM CHLORIDE 0.9 % IV SOLN: INTRAVENOUS | Status: AC

## 2012-10-01 MED ORDER — TICAGRELOR 90 MG PO TABS
90.0000 mg | ORAL_TABLET | Freq: Two times a day (BID) | ORAL | Status: DC
  Administered 2012-10-01 – 2012-10-03 (×6): 90 mg via ORAL
  Filled 2012-10-01 (×8): qty 1

## 2012-10-01 MED ORDER — ATORVASTATIN CALCIUM 40 MG PO TABS
40.0000 mg | ORAL_TABLET | Freq: Every day | ORAL | Status: DC
  Administered 2012-10-01 – 2012-10-02 (×3): 40 mg via ORAL
  Filled 2012-10-01 (×4): qty 1

## 2012-10-01 MED ORDER — LINAGLIPTIN 5 MG PO TABS
5.0000 mg | ORAL_TABLET | Freq: Every day | ORAL | Status: DC
  Administered 2012-10-01 – 2012-10-03 (×3): 5 mg via ORAL
  Filled 2012-10-01 (×3): qty 1

## 2012-10-01 MED ORDER — NITROGLYCERIN 0.4 MG SL SUBL: 0.4000 mg | SUBLINGUAL_TABLET | SUBLINGUAL | Status: DC | PRN

## 2012-10-01 MED ORDER — ACETAMINOPHEN 325 MG PO TABS
650.0000 mg | ORAL_TABLET | Freq: Four times a day (QID) | ORAL | Status: DC | PRN
  Administered 2012-10-02 – 2012-10-03 (×2): 650 mg via ORAL
  Filled 2012-10-01 (×2): qty 2

## 2012-10-01 MED ORDER — ONDANSETRON HCL 4 MG/2ML IJ SOLN: 4.0000 mg | Freq: Four times a day (QID) | INTRAMUSCULAR | Status: DC | PRN

## 2012-10-01 MED ORDER — ONDANSETRON HCL 4 MG PO TABS: 4.0000 mg | ORAL_TABLET | Freq: Four times a day (QID) | ORAL | Status: DC | PRN

## 2012-10-01 MED ORDER — DULOXETINE HCL 60 MG PO CPEP
60.0000 mg | ORAL_CAPSULE | Freq: Every day | ORAL | Status: DC
Start: 2012-10-01 — End: 2012-10-03
  Administered 2012-10-01 – 2012-10-03 (×3): 60 mg via ORAL
  Filled 2012-10-01 (×3): qty 1

## 2012-10-01 MED ORDER — INFLUENZA VIRUS VACC SPLIT PF IM SUSP
0.5000 mL | INTRAMUSCULAR | Status: AC
  Administered 2012-10-02: 0.5 mL via INTRAMUSCULAR
  Filled 2012-10-01: qty 0.5

## 2012-10-01 MED ORDER — CARVEDILOL 6.25 MG PO TABS
6.2500 mg | ORAL_TABLET | Freq: Two times a day (BID) | ORAL | Status: DC
  Administered 2012-10-01 – 2012-10-02 (×2): 6.25 mg via ORAL
  Filled 2012-10-01 (×6): qty 1

## 2012-10-01 NOTE — Progress Notes (Signed)
Utilization review completed.  

## 2012-10-01 NOTE — Progress Notes (Signed)
Report called to kim rn, pt going to [facility] via w/c with belongings. Andrea [name]

## 2012-10-01 NOTE — ED Provider Notes (Signed)
I saw and evaluated the patient, reviewed the resident's note and I agree with the findings and plan. I saw and evaluated the EKG adn agree with residents interpretation.  Pt history of dizziness with standing and evidence of orthostatic hypotension. The pressure is normalized leg but she is symptomatic when she goes to stand. She has severe congestive heart failure and creatinine has now bumped 0.5. Patient will be hydrated gently and feel that possibility that the carvedilol needs to be stopped. She was admitted  Gwyneth Sprout, MD 10/01/12 2333

## 2012-10-01 NOTE — Progress Notes (Signed)
[facility] Progress Note [place] TEAM 1 - Stepdown/ICU TEAM   Andrea [name] ION:[id number] DOB: 1951/01/24 DOA: 09/30/2012 PCP: Andrea Patella, MD  Brief narrative: 61 year old female patient with known ischemic cardiomyopathy with severe systolic dysfunction EF 15% on chronic diuretics. Presented to the emergency department because of complaints of dizziness with standing and walking. No other associated symptoms. She has recently been evaluated as an outpatient by gastroenterology service because of ongoing issues with heartburn. During that visit she was dizzy prompting blood pressure be checked and she was found to be hypotensive. She was subsequently transferred to the emergency department. In the ER her blood pressure remained low at 80 systolic. Patient endorsed to the admitting physician for about 3 days she has been running lower than normal blood pressures. Was also discovered that her creatinine had increased from 1.9 to 2.5.   Assessment/Plan:  Hypotension *Felt to be multifactorial with primary contribution from diuretics and subsequently worsened by ongoing usage of cardiac medications *We are continuing to hold her Lasix and her ARB *Blood pressure has improved with volume restoration  ARF (acute renal failure) *Again related to dehydration and hypotension *Trend is downward with rehydration *Follow electrolytes  Dehydration *We'll continue IV fluids but decrease rate since patient has poor ejection fraction  Azotemia/Heartburn *Baseline BUN is 46 and presented with a BUN greater than 70 *In setting of ongoing heartburn this is concerning patient may have underlying GI cause that may be leading to occult bleeding *We'll check stools for blood *Patient was on daily Protonix prior to admission and we have continued  Dilated cardiomyopathy/Chronic systolic congestive heart failure, NYHA class 4-EF 15% per ECHO 2011 *Currently appears  compensated *Repeat echocardiogram this admission to see if medical therapy has improved ejection fraction *On carvedilol 12.5 twice a day at home and will decrease to 6.25 because of hypotension  Diabetes mellitus type 2, controlled *CBGs well controlled here *Continue sliding scale insulin and linagliptin  Coronary artery disease *Currently asymptomatic with negative enzymes and EKG  Dizziness *Related to dehydration and orthostasis and appears to have resolved  AUTOMATIC IMPLANTABLE CARDIAC DEFIBRILLATOR SITU  Hypertension *Home medications on hold-see above  History of CVA (cerebrovascular accident)  DVT prophylaxis: On heparin subcutaneous every 8 hour Code Status: Full Family Communication: Spoke directly to patient Disposition Plan: Transfer to telemetry  Consultants: None  Procedures: None  Antibiotics: None  HPI/Subjective: Patient alert and denies chest pain or dizziness. She does endorse dyspnea on exertion which apparently was occurring at home. No other complaints.   Objective: Blood pressure 119/73, pulse 91, temperature 98.7 F (37.1 C), temperature source Oral, resp. rate 18, height 5' (1.524 m), weight 89.4 kg (197 lb 1.5 oz), SpO2 100.00%.  Intake/Output Summary (Last 24 hours) at 10/01/12 1411 Last data filed at 10/01/12 1233  Gross per 24 hour  Intake   1365 ml  Output   1050 ml  Net    315 ml     Exam: General: No acute respiratory distress Lungs: Clear to auscultation bilaterally without wheezes or crackles Cardiovascular: Regular rate and rhythm without murmur gallop or rub normal S1 and S2, systolic blood pressure in the 100's Abdomen: Nontender, nondistended, soft, bowel sounds positive, no rebound, no ascites, no appreciable mass Extremities: No significant cyanosis, clubbing, or edema bilateral lower extremities  Data Reviewed: Basic Metabolic Panel:  Lab 10/01/12 2440 10/01/12 0720 09/30/12 1932  NA 140 139 139  K 4.1 3.8 3.8   CL 101 101 98  CO2  26 24 26   GLUCOSE 169* 135* 112*  BUN 67* 70* 72*  CREATININE 1.97* 2.07* 2.57*  CALCIUM 10.3 10.2 10.7*  MG -- -- --  PHOS -- -- --   Liver Function Tests:  Lab 10/01/12 0720  AST 22  ALT 10  ALKPHOS 41  BILITOT 0.3  PROT 7.6  ALBUMIN 4.2   CBC:  Lab 10/01/12 0720 09/30/12 2200  WBC 4.0 5.5  NEUTROABS 1.7 --  HGB 12.3 12.5  HCT 36.7 37.6  MCV 68.6* 69.1*  PLT 160 158   Cardiac Enzymes:  Lab 10/01/12 0720 10/01/12 0108 09/30/12 2200  CKTOTAL -- -- --  CKMB -- -- --  CKMBINDEX -- -- --  TROPONINI <0.30 <0.30 <0.30   CBG:  Lab 10/01/12 0806 09/30/12 1751  GLUCAP 156* 133*    Recent Results (from the past 240 hour(s))  MRSA PCR SCREENING     Status: Normal   Collection Time   10/01/12 12:55 AM      Component Value Range Status Comment   MRSA by PCR NEGATIVE  NEGATIVE Final      Studies:  Recent x-ray studies have been reviewed in detail by the Attending Physician  Scheduled Meds:  Reviewed in detail by the Attending Physician   Junious Silk, ANP [facility] Office  [phone] Pager [phone]  On-Call/Text Page:      [email or web address]      password [email or web address]  If 7PM-7AM, please contact night-coverage [email or web address] Password [email or web address] 10/01/2012, 2:11 PM   LOS: 1 day   I have personally examined this patient and reviewed the entire database. I have reviewed the above note, made any necessary editorial changes, and agree with its content.  Lonia Blood, MD [facility]

## 2012-10-02 DIAGNOSIS — I059 Rheumatic mitral valve disease, unspecified: Secondary | ICD-10-CM

## 2012-10-02 DIAGNOSIS — Z95810 Presence of automatic (implantable) cardiac defibrillator: Secondary | ICD-10-CM

## 2012-10-02 DIAGNOSIS — I509 Heart failure, unspecified: Secondary | ICD-10-CM

## 2012-10-02 DIAGNOSIS — E119 Type 2 diabetes mellitus without complications: Secondary | ICD-10-CM

## 2012-10-02 LAB — GLUCOSE, CAPILLARY
Glucose-Capillary: 107 mg/dL — ABNORMAL HIGH (ref 70–99)
Glucose-Capillary: 114 mg/dL — ABNORMAL HIGH (ref 70–99)
Glucose-Capillary: 107 mg/dL — ABNORMAL HIGH (ref 70–99)
Glucose-Capillary: 184 mg/dL — ABNORMAL HIGH (ref 70–99)

## 2012-10-02 LAB — BASIC METABOLIC PANEL
Calcium: 10.3 mg/dL (ref 8.4–10.5)
Creatinine, Ser: 1.34 mg/dL — ABNORMAL HIGH (ref 0.50–1.10)
GFR calc Af Amer: 48 mL/min — ABNORMAL LOW (ref >90)
GFR calc non Af Amer: 42 mL/min — ABNORMAL LOW (ref >90)

## 2012-10-02 MED ORDER — CARVEDILOL 12.5 MG PO TABS
12.5000 mg | ORAL_TABLET | Freq: Two times a day (BID) | ORAL | Status: DC
  Administered 2012-10-02 – 2012-10-03 (×2): 12.5 mg via ORAL
  Filled 2012-10-02 (×4): qty 1

## 2012-10-02 MED ORDER — PERFLUTREN LIPID MICROSPHERE
1.0000 mL | INTRAVENOUS | Status: AC | PRN
  Administered 2012-10-02: 3 mL via INTRAVENOUS
  Filled 2012-10-02: qty 10

## 2012-10-02 NOTE — Progress Notes (Signed)
  Echocardiogram 2D Echocardiogram has been performed.  Andrea [name] 10/02/2012, 10:27 AM

## 2012-10-02 NOTE — Plan of Care (Signed)
Problem: Food- and Nutrition-Related Knowledge Deficit (NB-1.1) Goal: Nutrition education Formal process to instruct or train a patient/client in a skill or to impart knowledge to help patients/clients voluntarily manage or modify food choices and eating behavior to maintain or improve health.  Outcome: Completed/Met Date Met:  10/02/12  RD consulted for nutrition education regarding diabetes.     Lab Results  Component Value Date    HGBA1C 7.8* 06/25/2011    RD provided "Carbohydrate Counting for People with Diabetes" handout from the Academy of Nutrition and Dietetics. Discussed different food groups and their effects on blood sugar, emphasizing carbohydrate-containing foods. Provided list of carbohydrates and recommended serving sizes of common foods.  Discussed importance of controlled and consistent carbohydrate intake throughout the day. Provided examples of ways to balance meals/snacks and encouraged intake of high-fiber, whole grain complex carbohydrates.  Pt with questions regarding low purine diet choices for gout and vegetarianism. RD provided handouts and discussed current evidence.  Expect good compliance.  Body mass index is 38.71 kg/(m^2). Pt meets criteria for obese class I based on current BMI.  Current diet order is Carbohydrate Modified Medium, patient is consuming approximately 100% of meals at this time. Labs and medications reviewed. No further nutrition interventions warranted at this time. RD contact information provided. If additional nutrition issues arise, please re-consult RD.  Jarold Motto MS, RD, LDN Pager: [phone] After-hours pager: [phone]

## 2012-10-02 NOTE — Progress Notes (Signed)
  Echocardiogram 2D Echocardiogram with DEFINITY has been performed.  Andrea [name] 10/02/2012, 3:37 PM

## 2012-10-02 NOTE — Progress Notes (Signed)
Inpatient Diabetes Program Recommendations  AACE/ADA: New Consensus Statement on Inpatient Glycemic Control (2013)  Target Ranges:  Prepandial:   less than 140 mg/dL      Peak postprandial:   less than 180 mg/dL (1-2 hours)      Critically ill patients:  140 - 180 mg/dL   Diabetes Coordinator met with patient and discussed eating habits and carbohydrate counting.  Patient wants to make changes to help manage her diabetes but has trouble controlling her eating habits.  She said she eats when she is hungry and even when she is not hungry.  Asked patient to discuss [place] with her primary doctor.  Patient was appreciative of visit and did not have any further questions at this time.  Thank you  Piedad Climes [facility] Inpatient Diabetes Coordinator [phone]

## 2012-10-02 NOTE — Progress Notes (Signed)
[facility] PROGRESS NOTE  Andrea [name] ZOX:[id number] DOB: 12/17/1951 DOA: 09/30/2012 PCP: Lolita Patella, MD  Assessment/Plan Hypotension  -Improved. SBP range 110-122 -Felt to be multifactorial  -We are continuing to hold her Lasix and her ARB , we'll have to resume at a lower dose as an outpatient.  ARF (acute renal failure)  -2/2  to dehydration and hypotension  -continues to improved with IV fluids and restrictions of her blood pressure medication.  Dilated cardiomyopathy/Chronic systolic congestive heart failure, NYHA class repeat an echo on 10/12/2001 team shown ejection fraction of 25% (AICD) -Currently appears compensated  -Repeat echocardiogram results to evaluate apex of the heart.  -On carvedilol 12.5 twice a day at home and have decreased to 6.25 because of hypotension.  Diabetes mellitus type 2, controlled  -CBGs well controlled here  -Continue sliding scale insulin and linagliptin. -Will request Diabetes coordinator consult per pt request  Coronary artery disease  -Currently asymptomatic with negative enzymes and EKG   Dizziness  -Related to dehydration and orthostasis and appears to have resolved   Hypertension  -Home medications on hold-see above   Code Status: full Family Communication: Pt at bedside Disposition Plan: Home when medically stable hopefully 24 hours.    Consultants:  none  Procedures:  Antibiotics: none HPI/Subjective: Lying in bed watching TV. NAD. Denies pain/discomfort. Requests consult with dietician to help with diet for DM, Gout, GERD.  Objective: Filed Vitals:   10/01/12 1304 10/01/12 1738 10/01/12 2100 10/02/12 0540  BP: 119/73 107/65 122/64 110/90  Pulse: 91  90 92  Temp: 98.7 F (37.1 C)  98.2 F (36.8 C) 97.9 F (36.6 C)  TempSrc: Oral  Oral Oral  Resp: 18  18 18   Height:      Weight:    89.9 kg (198 lb 3.1 oz)  SpO2: 100%  100% 100%    Intake/Output Summary (Last 24 hours) at 10/02/12  1033 Last data filed at 10/02/12 0900  Gross per 24 hour  Intake 1264.17 ml  Output   1401 ml  Net -136.83 ml   Filed Weights   10/01/12 0050 10/02/12 0540  Weight: 89.4 kg (197 lb 1.5 oz) 89.9 kg (198 lb 3.1 oz)    Exam:   General:  Awake, alert, well nourished.  Cardiovascular: RRR, No MGR No LEE  Respiratory: normal effort BSCTAB, No wheeze, rales  Abdomen: soft, +BS, non-tender to palp. No mass  Data Reviewed: Basic Metabolic Panel:  Lab 10/02/12 8119 10/01/12 0900 10/01/12 0720 09/30/12 1932  NA 141 140 139 139  K 4.3 4.1 3.8 3.8  CL 107 101 101 98  CO2 26 26 24 26   GLUCOSE 112* 169* 135* 112*  BUN 44* 67* 70* 72*  CREATININE 1.34* 1.97* 2.07* 2.57*  CALCIUM 10.3 10.3 10.2 10.7*  MG -- -- -- --  PHOS -- -- -- --   Liver Function Tests:  Lab 10/01/12 0720  AST 22  ALT 10  ALKPHOS 41  BILITOT 0.3  PROT 7.6  ALBUMIN 4.2   No results found for this basename: LIPASE:5,AMYLASE:5 in the last 168 hours No results found for this basename: AMMONIA:5 in the last 168 hours CBC:  Lab 10/01/12 0720 09/30/12 2200  WBC 4.0 5.5  NEUTROABS 1.7 --  HGB 12.3 12.5  HCT 36.7 37.6  MCV 68.6* 69.1*  PLT 160 158   Cardiac Enzymes:  Lab 10/01/12 1535 10/01/12 0720 10/01/12 0108 09/30/12 2200  CKTOTAL -- -- -- --  CKMB -- -- -- --  CKMBINDEX -- -- -- --  TROPONINI <0.30 <0.30 <0.30 <0.30   BNP (last 3 results) No results found for this basename: PROBNP:3 in the last 8760 hours CBG:  Lab 10/02/12 0629 10/01/12 2106 10/01/12 1714 10/01/12 1210 10/01/12 0806  GLUCAP 107* 121* 209* 153* 156*    Recent Results (from the past 240 hour(s))  MRSA PCR SCREENING     Status: Normal   Collection Time   10/01/12 12:55 AM      Component Value Range Status Comment   MRSA by PCR NEGATIVE  NEGATIVE Final      Studies: No results found.  Scheduled Meds:   . atorvastatin  40 mg Oral QHS  . carvedilol  6.25 mg Oral BID WC  . DULoxetine  60 mg Oral Daily  . fenofibrate   160 mg Oral Daily  . heparin  5,000 Units Subcutaneous Q8H  . influenza  inactive virus vaccine  0.5 mL Intramuscular Tomorrow-1000  . insulin aspart  0-9 Units Subcutaneous TID WC  . insulin glargine  62 Units Subcutaneous q morning - 10a  . linagliptin  5 mg Oral Daily  . pantoprazole  40 mg Oral Q1200  . sodium chloride  3 mL Intravenous Q12H  . Ticagrelor  90 mg Oral BID  . DISCONTD: carvedilol  12.5 mg Oral BID WC  . DISCONTD: influenza  inactive virus vaccine  0.5 mL Intramuscular Tomorrow-1000   Continuous Infusions:   . sodium chloride 50 mL/hr at 10/02/12 0919    Principal Problem:  *Hypotension Active Problems:  Chronic systolic congestive heart failure, NYHA class 4-EF 15% per ECHO 2011  AUTOMATIC IMPLANTABLE CARDIAC DEFIBRILLATOR SITU  Hypertension  History of CVA (cerebrovascular accident)  Diabetes mellitus type 2, controlled  Coronary artery disease  Dilated cardiomyopathy  Dizziness  ARF (acute renal failure)  Dehydration  Azotemia  Heartburn    Time spent: 35 minutes    Gwenyth Bender NP [facility] Pager [phone]. If 8PM-8AM, please contact night-coverage at [email or web address], password [facility] 10/02/2012, 10:33 AM  LOS: 2 days

## 2012-10-03 LAB — BASIC METABOLIC PANEL
BUN: 24 mg/dL — ABNORMAL HIGH (ref 6–23)
Calcium: 9.6 mg/dL (ref 8.4–10.5)
Creatinine, Ser: 1.13 mg/dL — ABNORMAL HIGH (ref 0.50–1.10)
GFR calc Af Amer: 60 mL/min — ABNORMAL LOW (ref >90)
GFR calc non Af Amer: 51 mL/min — ABNORMAL LOW (ref >90)

## 2012-10-03 MED ORDER — FUROSEMIDE 20 MG PO TABS
20.0000 mg | ORAL_TABLET | Freq: Every day | ORAL | Status: DC
  Administered 2012-10-03: 20 mg via ORAL
  Filled 2012-10-03: qty 1

## 2012-10-03 MED ORDER — CARVEDILOL 25 MG PO TABS
25.0000 mg | ORAL_TABLET | Freq: Two times a day (BID) | ORAL | Status: DC
  Filled 2012-10-03 (×2): qty 1

## 2012-10-03 MED ORDER — IRBESARTAN 150 MG PO TABS
150.0000 mg | ORAL_TABLET | Freq: Every day | ORAL | Status: DC
  Administered 2012-10-03: 150 mg via ORAL
  Filled 2012-10-03: qty 1

## 2012-10-03 MED ORDER — FUROSEMIDE 40 MG PO TABS: 20.0000 mg | ORAL_TABLET | Freq: Two times a day (BID) | ORAL | Status: DC

## 2012-10-03 NOTE — Discharge Summary (Signed)
Physician Discharge Summary  Andrea [name] ZOX:[id number] DOB: [birth year] DOA: 09/30/2012  PCP: Lolita Patella, MD  Admit date: 09/30/2012 Discharge date: 10/03/2012  Discharge Diagnoses:  Principal Problem:  *Hypotension Active Problems:  Chronic systolic congestive heart failure, NYHA class 4-EF 15% per ECHO 2011  AUTOMATIC IMPLANTABLE CARDIAC DEFIBRILLATOR SITU  Hypertension  History of CVA (cerebrovascular accident)  Diabetes mellitus type 2, controlled  Coronary artery disease  ARF (acute renal failure)  Dehydration  Azotemia   Discharge Condition: Stable  Disposition:  Follow-up Information    Follow up with Lolita Patella, MD. In 2 weeks. (hospital follow up)    Contact information:   [facility] [address] [place] [address] [phone]       Follow up with Peter Swaziland, MD. In 2 weeks. (hospital follow up)    Contact information:   [address] [place] [address] [phone]          Diet: Low-sodium diet Wt Readings from Last 3 Encounters:  10/03/12 90.6 kg (199 lb 11.8 oz)  01/10/12 93.9 kg (207 lb 0.2 oz)  11/28/11 92.534 kg (204 lb)    History of present illness:  61 year-old female with history of ischemic cardiomyopathy last EF measured in 2011 was 15%, CAD status post stenting, previous CVA, diabetes mellitus2 started experiencing dizziness today morning. Dizziness is only on standing and walking. Does not happen when she takes rest and on lying. Denies any headache or nausea vomiting diarrhea fever chills. She actually was scheduled to go to gastroenterology consult for further workup on her heartburn which she gets off and on. After the gastroenterology consult patient was found to be dizzy and blood pressure was found be low. Patient was transferred to ER. In the ER patient continued to be having low blood pressure in the high 80 systolic. Patient states that over the  last 2-3 days she has been running low blood pressure. At this time patient will be admitted for further management. In addition her creatinine which usually is around 1.9 has increased to 2.5. Patient otherwise is asymptomatic.   Hospital Course:  Hypotension -Improved. SBP range 110-122  -Felt to be multifactorial secondary to Lasix, ARB and Coreg. -These were restarted slowly.  Chronic systolic congestive heart failure, NYHA class 4-EF 20 % per ECHO 10/02/2012: -Initially her heart failure medications were held and her to hypotension. As her hypotension improved her beta blocker was initiated she tolerated well and he was maxed out. As her renal function from her ARB was restarted. She will go home on overdose of Lasix. She has been instructed that if she feels dizzy upon standing she should help Lasix and go see her cardiologist. She'll followup appointment with her cardiologist on 10/22/2012.   Hypertension  -controlled.  Diabetes mellitus type 2, controlled -Has remained well controlled no changes were made.   ARF (acute renal failure) -This multifactorial secondary to diuretic and ARB. These were held on admission as her blood pressure trended up and her acute renal failure resolved these were restarted. She will be sent a lower dose of Lasix.   Discharge Exam: Filed Vitals:   10/03/12 0511  BP: 111/76  Pulse: 97  Temp: 98.3 F (36.8 C)  Resp: 18   Filed Vitals:   10/02/12 1430 10/02/12 1514 10/02/12 1946 10/03/12 0511  BP: 98/55 100/55 107/65 111/76  Pulse:  93 91 97  Temp:   98 F (36.7 C) 98.3 F (36.8 C)  TempSrc:   Oral Oral  Resp: 18  18 18   Height:      Weight:    90.6 kg (199 lb 11.8 oz)  SpO2:   100% 100%   General: Awake alert and oriented x3 Cardiovascular: Regular rate and rhythm Respiratory: Good air movement clear to auscultation  Discharge Instructions  Discharge Orders    Future Appointments: Provider: Department: Dept Phone: Center:    10/06/2012 8:45 AM [facility] [phone] [facility]   10/15/2012 12:30 PM Sherrie George, MD [facility] [phone] None   10/22/2012 3:00 PM Peter M [place], MD [facility] [phone] [facility]     Future Orders Please Complete By Expires   Diet - low sodium heart healthy      Increase activity slowly          Medication List     As of 10/03/2012  9:24 AM    TAKE these medications         acetaminophen 500 MG tablet   Commonly known as: TYLENOL   Take 500 mg by mouth 3 (three) times daily as needed. For pain      atorvastatin 40 MG tablet   Commonly known as: LIPITOR   Take 1 tablet by mouth at bedtime.      carvedilol 25 MG tablet   Commonly known as: COREG   Take 12.5 mg by mouth 2 (two) times daily with a meal.      DULoxetine 60 MG capsule   Commonly known as: CYMBALTA   Take 60 mg by mouth daily.      fenofibrate 145 MG tablet   Commonly known as: TRICOR   Take 145 mg by mouth daily.      furosemide 40 MG tablet   Commonly known as: LASIX   Take 0.5 tablets (20 mg total) by mouth 2 (two) times daily.      insulin glargine 100 UNIT/ML injection   Commonly known as: LANTUS   Inject 62 Units into the skin every morning.      JANUVIA 100 MG tablet   Generic drug: sitaGLIPtin   Take 1 tablet by mouth Daily.      nitroGLYCERIN 0.4 MG SL tablet   Commonly known as: NITROSTAT   Place 0.4 mg under the tongue every 5 (five) minutes as needed. For chest pain      olmesartan 40 MG tablet   Commonly known as: BENICAR   Take 20 mg by mouth daily.      pantoprazole 40 MG tablet   Commonly known as: PROTONIX   Take 40 mg by mouth daily.      Ticagrelor 90 MG Tabs tablet   Commonly known as: BRILINTA   Take 90 mg by mouth 2 (two) times daily.          The results of significant diagnostics from this hospitalization (including imaging, microbiology, ancillary and laboratory) are listed below for reference.     Significant Diagnostic Studies: No results found.  Microbiology: Recent Results (from the past 240 hour(s))  MRSA PCR SCREENING     Status: Normal   Collection Time   10/01/12 12:55 AM      Component Value Range Status Comment   MRSA by PCR NEGATIVE  NEGATIVE Final      Labs: Basic Metabolic Panel:  Lab 10/03/12 5784 10/02/12 0545 10/01/12 0900 10/01/12 0720 09/30/12 1932  NA 140 141 140 139 139  K 3.9 4.3 -- -- --  CL 106 107 101 101 98  CO2 26 26 26 24 26   GLUCOSE 109* 112* 169* 135* 112*  BUN 24* 44* 67* 70* 72*  CREATININE 1.13* 1.34* 1.97* 2.07* 2.57*  CALCIUM 9.6 10.3 10.3 10.2 10.7*  MG -- -- -- -- --  PHOS -- -- -- -- --   Liver Function Tests:  Lab 10/01/12 0720  AST 22  ALT 10  ALKPHOS 41  BILITOT 0.3  PROT 7.6  ALBUMIN 4.2   No results found for this basename: LIPASE:5,AMYLASE:5 in the last 168 hours No results found for this basename: AMMONIA:5 in the last 168 hours CBC:  Lab 10/01/12 0720 09/30/12 2200  WBC 4.0 5.5  NEUTROABS 1.7 --  HGB 12.3 12.5  HCT 36.7 37.6  MCV 68.6* 69.1*  PLT 160 158   Cardiac Enzymes:  Lab 10/01/12 1535 10/01/12 0720 10/01/12 0108 09/30/12 2200  CKTOTAL -- -- -- --  CKMB -- -- -- --  CKMBINDEX -- -- -- --  TROPONINI <0.30 <0.30 <0.30 <0.30   BNP: No components found with this basename: POCBNP:5 CBG:  Lab 10/03/12 0623 10/02/12 2026 10/02/12 1645 10/02/12 1125 10/02/12 0629  GLUCAP 101* 184* 107* 114* 107*    Time coordinating discharge: *40 minutes Signed:  Marinda Elk  [facility] 10/03/2012, 9:24 AM

## 2012-10-03 NOTE — Progress Notes (Signed)
Pt reports feeling of shortness of breath with onset during her sleep. She said it feels similar to an asthma attack, but no wheezing. Pt's lungs clear in all fields, oxygen saturation 100% on room air. Pt feels it may be due to acid reflux. Pt stated that she takes her acid reflux medication twice a day at home and has only been taking it once a day here. Advised would leave note for MD to review.

## 2012-10-06 ENCOUNTER — Telehealth: Payer: Self-pay | Admitting: Cardiology

## 2012-10-06 ENCOUNTER — Ambulatory Visit
Admission: RE | Admit: 2012-10-06 | Discharge: 2012-10-06 | Disposition: A | Discharge status: Home / Self Care | Payer: BC Managed Care – PPO | Source: Ambulatory Visit | Attending: Gastroenterology | Admitting: Gastroenterology

## 2012-10-06 DIAGNOSIS — R112 Nausea with vomiting, unspecified: Secondary | ICD-10-CM

## 2012-10-06 NOTE — Telephone Encounter (Addendum)
She was supposed to have endo before but this was cancelled for hypotension. She was admitted and meds adjusted. Ok to proceed with endo as long as BP stable.  Peter Swaziland MD, [facility]

## 2012-10-06 NOTE — Telephone Encounter (Signed)
New problem:   recently discharged from hospital . Patient need an endo. Scheduled on 10/21. Is this O.K.

## 2012-10-06 NOTE — Telephone Encounter (Signed)
Will forward to Dr Jordan for review  

## 2012-10-06 NOTE — Telephone Encounter (Signed)
Andrea [name] with [facility]

## 2012-10-15 ENCOUNTER — Encounter (INDEPENDENT_AMBULATORY_CARE_PROVIDER_SITE_OTHER): Payer: BC Managed Care – PPO | Admitting: Ophthalmology

## 2012-10-22 ENCOUNTER — Ambulatory Visit: Payer: BC Managed Care – PPO | Admitting: Cardiology

## 2012-10-27 ENCOUNTER — Encounter (INDEPENDENT_AMBULATORY_CARE_PROVIDER_SITE_OTHER): Payer: BC Managed Care – PPO | Admitting: Ophthalmology

## 2012-11-03 ENCOUNTER — Encounter (INDEPENDENT_AMBULATORY_CARE_PROVIDER_SITE_OTHER): Payer: BC Managed Care – PPO | Admitting: Ophthalmology

## 2012-11-03 DIAGNOSIS — I10 Essential (primary) hypertension: Secondary | ICD-10-CM

## 2012-11-03 DIAGNOSIS — H43819 Vitreous degeneration, unspecified eye: Secondary | ICD-10-CM

## 2012-11-03 DIAGNOSIS — H35039 Hypertensive retinopathy, unspecified eye: Secondary | ICD-10-CM

## 2012-11-03 DIAGNOSIS — H2510 Age-related nuclear cataract, unspecified eye: Secondary | ICD-10-CM

## 2012-11-03 DIAGNOSIS — H3530 Unspecified macular degeneration: Secondary | ICD-10-CM

## 2012-11-03 DIAGNOSIS — E11319 Type 2 diabetes mellitus with unspecified diabetic retinopathy without macular edema: Secondary | ICD-10-CM

## 2012-11-03 DIAGNOSIS — E1165 Type 2 diabetes mellitus with hyperglycemia: Secondary | ICD-10-CM

## 2012-11-14 ENCOUNTER — Encounter: Payer: BC Managed Care – PPO | Admitting: Cardiology

## 2012-11-18 ENCOUNTER — Encounter: Payer: Self-pay | Admitting: Cardiology

## 2012-11-18 ENCOUNTER — Encounter: Payer: Self-pay | Admitting: *Deleted

## 2012-11-18 ENCOUNTER — Ambulatory Visit (INDEPENDENT_AMBULATORY_CARE_PROVIDER_SITE_OTHER): Payer: BC Managed Care – PPO | Admitting: Cardiology

## 2012-11-18 VITALS — BP 140/90 | HR 100 | Resp 18 | Ht 60.0 in | Wt 195.0 lb

## 2012-11-18 DIAGNOSIS — Z95810 Presence of automatic (implantable) cardiac defibrillator: Secondary | ICD-10-CM

## 2012-11-18 DIAGNOSIS — I255 Ischemic cardiomyopathy: Secondary | ICD-10-CM

## 2012-11-18 DIAGNOSIS — I2589 Other forms of chronic ischemic heart disease: Secondary | ICD-10-CM

## 2012-11-18 NOTE — Progress Notes (Signed)
ELECTROPHYSIOLOGY OFFICE NOTE  Patient ID: Andrea [name] MRN: [id number], DOB/AGE: 1951-07-12   Date of Visit: 11/18/2012  Primary Physician: Lolita Patella, MD Primary Cardiologist: [place], MD Reason for Visit: Device follow-up  History of Present Illness Andrea [name] is a pleasant 61 year old woman with an ischemic CM, EF 20-25%, s/p single chamber ICD, chronic systolic HF, CAD, HTN, DM, CVA and dyslipidemia who presents today for device follow-up. She reports intermittent burning chest discomfort that is worse after meals and with bending forward or squatting. She also notices chest burning and occasional SOB with lying flat in bed at night. She feels she has acid reflux. She has had nauseas and vomiting. She denies any exertional component, specifically angina or worsening DOE. She recently underwent GI evaluation with an abdominal US and gastric emptying study which did not reveal any significant abnormalities. She also had an upper endoscopy which was normal. She denies palpitations, dizziness, near syncope or syncope. She reports occasional noncompliance with low sodium diet but states she is compliant with her medications. Of note, she was recently hospitalized 09/30/2012 with hypotension and orthostasis. Her ARB was discontinued.  Past Medical History Past Medical History  Diagnosis Date  . ISCHEMIC HEART DISEASE 02/14/2011  . Chronic systolic heart failure 02/14/2011  . AUTOMATIC IMPLANTABLE CARDIAC DEFIBRILLATOR SITU 02/14/2011  . Hypertension   . Stroke 02/2010    left brain CVA? recurrent TIA's treated with TPA  . Diabetes mellitus     insulin-dependent  . Dyslipidemia   . Premature ventricular contractions (PVCs) (VPCs)   . Arthritis   . Migraine headache   . GERD (gastroesophageal reflux disease)   . Coronary artery disease     stenting of right coronary artery. also diagonal stenosis - treated medically  . Hyperlipidemia   . Dilated cardiomyopathy    with an ejection fraction of around 15%  . Pulmonary hypertension   . Gout   . Obesity   . DJD (degenerative joint disease)   . Thrombocytopenia   . Depression     Past Surgical History Past Surgical History  Procedure Date  . Cardiac catheterization   . Tonsillectomy   . Oophorectomy     right  . Mass excision     left hip  . Cervical polypectomy   . Foot surgery   . US echocardiography 2011     Allergies/Intolerances Allergies  Allergen Reactions  . Aspirin Shortness Of Breath and Other (See Comments)    Asthma symptoms  . Percocet (Oxycodone-Acetaminophen) Nausea And Vomiting  . Darvon Nausea And Vomiting  . Imitrex (Sumatriptan Base) Other (See Comments)    Unknown reaction  . Nsaids Nausea And Vomiting    Current Home Medications Current Outpatient Prescriptions  Medication Sig Dispense Refill  . acetaminophen (TYLENOL) 500 MG tablet Take 500 mg by mouth 3 (three) times daily as needed. For pain      . atorvastatin (LIPITOR) 40 MG tablet Take 1 tablet by mouth at bedtime.      . carvedilol (COREG) 25 MG tablet Take 25 mg by mouth 2 (two) times daily with a meal.       . DULoxetine (CYMBALTA) 60 MG capsule Take 60 mg by mouth daily.        . fenofibrate (TRICOR) 145 MG tablet Take 145 mg by mouth daily.      . furosemide (LASIX) 40 MG tablet Take 0.5 tablets (20 mg total) by mouth 2 (two) times daily.  30 tablet  0  .  insulin glargine (LANTUS) 100 UNIT/ML injection Inject 62 Units into the skin every morning.       Marland Kitchen JANUVIA 100 MG tablet Take 1 tablet by mouth Daily.      . nitroGLYCERIN (NITROSTAT) 0.4 MG SL tablet Place 0.4 mg under the tongue every 5 (five) minutes as needed. For chest pain      . pantoprazole (PROTONIX) 40 MG tablet Take 40 mg by mouth daily.      . Ticagrelor (BRILINTA) 90 MG TABS tablet Take 90 mg by mouth 2 (two) times daily.        Social History Social History  . Marital Status: Divorced   Social History Main Topics  . Smoking  status: Former Smoker -- 1 years    Types: Cigarettes    Quit date: 04/23/1982  . Smokeless tobacco: Never Used  . Alcohol Use: No  . Drug Use: No   Review of Systems General: No chills, fever, night sweats or weight changes Cardiovascular: No dyspnea on exertion, edema, orthopnea, palpitations, paroxysmal nocturnal dyspnea Dermatological: No rash, lesions or masses Respiratory: No cough Urologic: No hematuria, dysuria Abdominal: No diarrhea, bright red blood per rectum, melena, or hematemesis Neurologic: No visual changes, weakness, changes in mental status All other systems reviewed and are otherwise negative except as noted above.  Physical Exam Blood pressure 140/90, pulse 100, resp. rate 18, height 5' (1.524 m), weight 195 lb (88.451 kg).  General: Well developed, well appearing 61 year old female in no acute distress. HEENT: Normocephalic, atraumatic. EOMs intact. Sclera nonicteric. Oropharynx clear.  Neck: Supple. No JVD. Lungs: Respirations regular and unlabored, CTA bilaterally. No wheezes, rales or rhonchi. Heart: RRR. S1, S2 present. No murmurs, rub, S3 or S4. Abdomen: Soft, non-distended.  Extremities: No clubbing, cyanosis or edema. DP/PT/Radials 2+ and equal bilaterally. Musculoskeletal: No kyphosis. Psych: Normal affect. Neuro: Alert and oriented X 3. Moves all extremities spontaneously.   Diagnostics Echocardiogram 10/02/2012 Study Conclusions - Left ventricle: The cavity size was mildly dilated. Wall thickness was normal. Systolic function was severely reduced. The estimated ejection fraction was in the range of 20% to 25%. Diffuse hypokinesis. The apex was poorly visualized. I suspect that there is trabeculation and false chords but no thrombus. Indeterminant diastolic function. - Aortic valve: There was no stenosis. - Mitral valve: Mildly calcified annulus. Moderate-severe regurgitation, central jet likely from annular dilation. - Left atrium: The atrium  was moderately dilated. - Right ventricle: The cavity size was normal. Pacer wire or catheter noted in right ventricle. Systolic function was mildly reduced. - Tricuspid valve: Peak RV-RA gradient:62mm Hg (S). - Pulmonary arteries: PA peak pressure: 39mm Hg (S). - Inferior vena cava: The vessel was normal in size; the respirophasic diameter changes were in the normal range (= 50%); findings are consistent with normal central venous pressure. Impressions: - Mildly dilated LV with severe systolic dysfunction, EF 20-25%. Global hypokinesis. The apex is not well-visualized. I suspect that there is no thrombus (looks like false chords/trabeculations). However, would repeat limited study with contrast to assess apex. There was moderate to severe mitral regurgitation likely due to annular dilatation (central jet). Normal RV size, mildly decreased systolic function. Mild pulmonary hypertension. Repeat limited echo with Definity 09/2012 - NO LV thrombus  12-lead ECG normal sinus rhythm at 94 bpm; PR 160, QRS 116, QT/QTc 386/482; lateral T wave inversions; similar to previous ECGs Device interrogation performed today by me shows normal ICD function with good battery status and stable lead parameters/measurements; no VT/VF episodes;  no programming changes made; see [facility] report  Assessment and Plan 1. Ischemic CM, ICD in place 2. Chronic systolic HF 3. CAD 4. HTN Andrea [name]'s ICD function is normal. She has not had any VT/VF episodes.There were no programming changes made today. She does not appear volume overloaded today. She will continue medical therapy as previously directed by Dr. Swaziland. Of note, she was hospitalized 3 weeks ago with hypotension and orthostasis. Her carvedilol has now been restarted and optimized. Her BP today is mildly elevated. She will need her ARB added back at low dose with close BP follow-up. She will continue medical therapy for known CAD and follow-up with Dr.  Swaziland. She has an appointment on Monday, 11/14/2012, with Dr. Swaziland for hospital follow-up. She was instructed to keep this appointment. She was also counseled regarding the importance of compliance with low sodium diet.   This plan of care was formulated with Dr. Lewayne Bunting who was in to see the patient with me.  Signed, Rick Duff, PA-C 11/18/2012, 2:53 PM

## 2012-11-18 NOTE — Patient Instructions (Signed)
Your physician wants you to follow-up in: 6 months in the device clinic and 12 months with Dr Taylor You will receive a reminder letter in the mail two months in advance. If you don't receive a letter, please call our office to schedule the follow-up appointment.  

## 2012-11-24 ENCOUNTER — Encounter: Payer: Self-pay | Admitting: Cardiology

## 2012-11-24 ENCOUNTER — Ambulatory Visit (INDEPENDENT_AMBULATORY_CARE_PROVIDER_SITE_OTHER): Payer: BC Managed Care – PPO | Admitting: Cardiology

## 2012-11-24 VITALS — BP 140/88 | HR 107 | Ht 60.0 in | Wt 190.8 lb

## 2012-11-24 DIAGNOSIS — I259 Chronic ischemic heart disease, unspecified: Secondary | ICD-10-CM

## 2012-11-24 DIAGNOSIS — I10 Essential (primary) hypertension: Secondary | ICD-10-CM

## 2012-11-24 DIAGNOSIS — I5022 Chronic systolic (congestive) heart failure: Secondary | ICD-10-CM

## 2012-11-24 DIAGNOSIS — I509 Heart failure, unspecified: Secondary | ICD-10-CM

## 2012-11-24 DIAGNOSIS — I2510 Atherosclerotic heart disease of native coronary artery without angina pectoris: Secondary | ICD-10-CM

## 2012-11-24 DIAGNOSIS — Z95810 Presence of automatic (implantable) cardiac defibrillator: Secondary | ICD-10-CM

## 2012-11-24 LAB — BASIC METABOLIC PANEL
BUN: 21 mg/dL (ref 6–23)
Chloride: 100 mEq/L (ref 96–112)
Glucose, Bld: 168 mg/dL — ABNORMAL HIGH (ref 70–99)
Potassium: 3.6 mEq/L (ref 3.5–5.1)
Sodium: 139 mEq/L (ref 135–145)

## 2012-11-24 NOTE — Progress Notes (Signed)
Quick Note:  BMET is OK. Let's resume Benicar at 20 mg daily. Recheck BMET in 3-4 weeks. Peter Swaziland MD, [facility]   ______

## 2012-11-24 NOTE — Progress Notes (Signed)
[name] Date of Birth: [birth year]   History of Present Illness: Andrea [name] is seen for followup today. She is status post stenting of the distal right coronary with a 3.5 x 16 mm Promus Element stent on December 15, 2010.  In April of 2012 she had subtotal occlusion of the proximal RCA and this was stented with a 3.5 x 16 mm Promus Element stent. This was in an area that on previous catheterization had only mild disease. In June of 2012 showed repeat cardiac catheterization that showed nonobstructive disease. She also has a history of cardiomyopathy with ejection fraction of 20-25%. She is status post single chamber ICD. She was admitted in October with profound hypotension and acute renal insufficiency. Her ARB was held. Her renal function improved. She was started back on her carvedilol and Lasix. On followup today she reports that she has had more asthma symptoms over the past week. She continues to lose weight. She is not eating well. She has had no increase in swelling. She still has shortness of breath and orthopnea. She complains of significant leg fatigue and is worried about having PAD.  Current Outpatient Prescriptions on File Prior to Visit  Medication Sig Dispense Refill  . acetaminophen (TYLENOL) 500 MG tablet Take 500 mg by mouth 3 (three) times daily as needed. For pain      . atorvastatin (LIPITOR) 40 MG tablet Take 1 tablet by mouth at bedtime.      Marland Kitchen buPROPion (WELLBUTRIN XL) 150 MG 24 hr tablet       . carvedilol (COREG) 25 MG tablet Take 25 mg by mouth 2 (two) times daily with a meal.       . DULoxetine (CYMBALTA) 60 MG capsule Take 60 mg by mouth daily.        . fenofibrate (TRICOR) 145 MG tablet Take 145 mg by mouth daily.      . furosemide (LASIX) 40 MG tablet Take 0.5 tablets (20 mg total) by mouth 2 (two) times daily.  30 tablet  0  . insulin glargine (LANTUS) 100 UNIT/ML injection Inject 62 Units into the skin every morning.       Marland Kitchen JANUVIA 100 MG tablet Take 1  tablet by mouth Daily.      Marland Kitchen levalbuterol (XOPENEX HFA) 45 MCG/ACT inhaler Inhale 1-2 puffs into the lungs every 4 (four) hours as needed.      . nitroGLYCERIN (NITROSTAT) 0.4 MG SL tablet Place 0.4 mg under the tongue every 5 (five) minutes as needed. For chest pain      . pantoprazole (PROTONIX) 40 MG tablet Take 40 mg by mouth daily.        Allergies  Allergen Reactions  . Aspirin Shortness Of Breath and Other (See Comments)    Asthma symptoms  . Percocet (Oxycodone-Acetaminophen) Nausea And Vomiting  . Darvon Nausea And Vomiting  . Imitrex (Sumatriptan Base) Other (See Comments)    Unknown reaction  . Nsaids Nausea And Vomiting    Past Medical History  Diagnosis Date  . ISCHEMIC HEART DISEASE 02/14/2011  . Chronic systolic heart failure 02/14/2011  . AUTOMATIC IMPLANTABLE CARDIAC DEFIBRILLATOR SITU 02/14/2011  . Hypertension   . Stroke 02/2010    left brain CVA? recurrent TIA's treated with TPA  . Diabetes mellitus     insulin-dependent  . Dyslipidemia   . Premature ventricular contractions (PVCs) (VPCs)   . Arthritis   . Migraine headache   . GERD (gastroesophageal reflux disease)   . Coronary artery  disease     stenting of right coronary artery. also diagonal stenosis - treated medically  . Hyperlipidemia   . Dilated cardiomyopathy     with an ejection fraction of around 15%  . Pulmonary hypertension   . Gout   . Obesity   . DJD (degenerative joint disease)   . Thrombocytopenia   . Depression     Past Surgical History  Procedure Date  . Cardiac catheterization   . Tonsillectomy   . Oophorectomy     right  . Mass excision     left hip  . Cervical polypectomy   . Foot surgery   . US echocardiography 2011    History  Smoking status  . Former Smoker -- 1 years  . Types: Cigarettes  . Quit date: 04/23/1982  Smokeless tobacco  . Never Used    History  Alcohol Use No    Family History  Problem Relation Age of Onset  . Heart failure Mother   .  Heart attack Father   . Heart disease Brother     Review of Systems: The review of systems is positive for recent ICD check which was satisfactory. All other systems were reviewed and are negative.  Physical Exam: BP 140/88  Pulse 107  Ht 5' (1.524 m)  Wt 190 lb 12.8 oz (86.546 kg)  BMI 37.26 kg/m2  SpO2 96% She is an obese black female in no acute distress. HEENT exam is unremarkable. She has no JVD or bruits. Lungs are clear. Cardiac exam reveals a regular rate and rhythm without gallop, murmur, or click. ICD is in place. There is no swelling or tenderness around the ICD site. Abdomen is obese, soft, nontender. Pedal pulses are good. She has no edema. She is alert and oriented x3. She has a significant tremor which is more pronounced on the left.  LABORATORY DATA:   Assessment / Plan: 1. Chronic congestive heart failure with ejection fraction of 20-25%. She does not have any significant volume overloaded today. We will check a basic metabolic panel today. If her renal function has returned to normal we will initiate an ARB at a lower dose. Continue carvedilol and Lasix at current dose.  2. Coronary disease status post prior stenting of the distal and proximal RCA as noted above. She is more than a year out from her intervention. We will stop her Brilinta at this point since this may be contributing to her shortness of breath.  3. Hypertension, mildly elevated. We'll write the results of her renal indices. Probably resume her ARB.

## 2012-11-24 NOTE — Patient Instructions (Signed)
Stop taking brilinta  Continue your other medication for now.   We will check your kidney function by blood test today. If this is good we may resume Benicar at a lower dose.  We will schedule you for lower extremity arterial dopplers   I will see you again in 3 months.

## 2012-11-25 ENCOUNTER — Other Ambulatory Visit: Payer: Self-pay

## 2012-11-25 DIAGNOSIS — I10 Essential (primary) hypertension: Secondary | ICD-10-CM

## 2012-11-28 NOTE — Addendum Note (Signed)
Addended by: Burnett Kanaris A on: 11/28/2012 01:19 PM   Modules accepted: Orders

## 2012-12-01 ENCOUNTER — Encounter (INDEPENDENT_AMBULATORY_CARE_PROVIDER_SITE_OTHER): Payer: BC Managed Care – PPO

## 2012-12-01 DIAGNOSIS — I5022 Chronic systolic (congestive) heart failure: Secondary | ICD-10-CM

## 2012-12-01 DIAGNOSIS — I70219 Atherosclerosis of native arteries of extremities with intermittent claudication, unspecified extremity: Secondary | ICD-10-CM

## 2012-12-01 DIAGNOSIS — Z95810 Presence of automatic (implantable) cardiac defibrillator: Secondary | ICD-10-CM

## 2012-12-01 DIAGNOSIS — I259 Chronic ischemic heart disease, unspecified: Secondary | ICD-10-CM

## 2012-12-01 DIAGNOSIS — I2510 Atherosclerotic heart disease of native coronary artery without angina pectoris: Secondary | ICD-10-CM

## 2012-12-01 DIAGNOSIS — I10 Essential (primary) hypertension: Secondary | ICD-10-CM

## 2012-12-12 ENCOUNTER — Encounter: Payer: Self-pay | Admitting: Internal Medicine

## 2012-12-26 ENCOUNTER — Other Ambulatory Visit: Payer: BC Managed Care – PPO

## 2013-01-13 ENCOUNTER — Other Ambulatory Visit: Payer: Self-pay

## 2013-01-13 MED ORDER — FENOFIBRATE 145 MG PO TABS: 145.0000 mg | ORAL_TABLET | Freq: Every day | ORAL | Status: DC

## 2013-02-16 ENCOUNTER — Ambulatory Visit: Payer: BC Managed Care – PPO | Admitting: Cardiology

## 2013-03-29 ENCOUNTER — Other Ambulatory Visit: Payer: Self-pay | Admitting: Cardiology

## 2013-03-31 DIAGNOSIS — I214 Non-ST elevation (NSTEMI) myocardial infarction: Secondary | ICD-10-CM

## 2013-03-31 HISTORY — PX: CORONARY ANGIOPLASTY WITH STENT PLACEMENT: SHX49

## 2013-03-31 HISTORY — DX: Non-ST elevation (NSTEMI) myocardial infarction: I21.4

## 2013-04-01 ENCOUNTER — Encounter: Payer: Self-pay | Admitting: Cardiology

## 2013-04-01 ENCOUNTER — Ambulatory Visit (INDEPENDENT_AMBULATORY_CARE_PROVIDER_SITE_OTHER): Payer: BC Managed Care – PPO | Admitting: Cardiology

## 2013-04-01 VITALS — BP 110/60 | HR 104 | Ht 60.0 in | Wt 188.6 lb

## 2013-04-01 DIAGNOSIS — I4949 Other premature depolarization: Secondary | ICD-10-CM

## 2013-04-01 DIAGNOSIS — I493 Ventricular premature depolarization: Secondary | ICD-10-CM

## 2013-04-01 DIAGNOSIS — I509 Heart failure, unspecified: Secondary | ICD-10-CM

## 2013-04-01 DIAGNOSIS — Z95810 Presence of automatic (implantable) cardiac defibrillator: Secondary | ICD-10-CM

## 2013-04-01 DIAGNOSIS — I5022 Chronic systolic (congestive) heart failure: Secondary | ICD-10-CM

## 2013-04-01 DIAGNOSIS — I2510 Atherosclerotic heart disease of native coronary artery without angina pectoris: Secondary | ICD-10-CM

## 2013-04-01 NOTE — Patient Instructions (Signed)
Continue your current therapy  I will see you again in 6 months.   

## 2013-04-02 NOTE — Progress Notes (Signed)
Andrea [name] Date of Birth: 02-09-1951   History of Present Illness: Andrea [name] is seen for followup today. She is status post stenting of the distal right coronary with a 3.5 x 16 mm Promus Element stent on December 15, 2010.  In April of 2012 she had subtotal occlusion of the proximal RCA and this was stented with a 3.5 x 16 mm Promus Element stent. This was in an area that on previous catheterization had only mild disease. In June of 2012 showed repeat cardiac catheterization that showed nonobstructive disease. She also has a history of cardiomyopathy with ejection fraction of 20-25%. She is status post single chamber ICD. She was admitted in October with profound hypotension and acute renal insufficiency. Her ARB was held. Her renal function improved. She was started back on her carvedilol and Lasix. On her last visit she was complaining of significant symptoms of dyspnea and wheezing. We stopped her Brilinta and her symptoms resolved. On followup today she is feeling very well. She's had no increase in edema or orthopnea. She has no chest pain.  Current Outpatient Prescriptions on File Prior to Visit  Medication Sig Dispense Refill  . acetaminophen (TYLENOL) 500 MG tablet Take 500 mg by mouth 3 (three) times daily as needed. For pain      . atorvastatin (LIPITOR) 40 MG tablet Take 1 tablet by mouth at bedtime.      Marland Kitchen buPROPion (WELLBUTRIN XL) 150 MG 24 hr tablet Take 150 mg by mouth as directed.       . carvedilol (COREG) 25 MG tablet TAKE 1 TABLET TWICE A DAY WITH A MEAL  180 tablet  1  . DULoxetine (CYMBALTA) 60 MG capsule Take 60 mg by mouth daily.        . fenofibrate (TRICOR) 145 MG tablet Take 1 tablet (145 mg total) by mouth daily.  30 tablet  5  . JANUVIA 100 MG tablet Take 1 tablet by mouth Daily.      . nitroGLYCERIN (NITROSTAT) 0.4 MG SL tablet Place 0.4 mg under the tongue every 5 (five) minutes as needed. For chest pain      . pantoprazole (PROTONIX) 40 MG tablet Take 40 mg  by mouth daily.       No current facility-administered medications on file prior to visit.    Allergies  Allergen Reactions  . Aspirin Shortness Of Breath and Other (See Comments)    Asthma symptoms  . Brilinta (Ticagrelor) Shortness Of Breath and Other (See Comments)    Gout   . Percocet (Oxycodone-Acetaminophen) Nausea And Vomiting  . Darvon Nausea And Vomiting  . Imitrex (Sumatriptan Base) Other (See Comments)    Unknown reaction  . Nsaids Nausea And Vomiting    Past Medical History  Diagnosis Date  . ISCHEMIC HEART DISEASE 02/14/2011  . Chronic systolic heart failure 02/14/2011  . AUTOMATIC IMPLANTABLE CARDIAC DEFIBRILLATOR SITU 02/14/2011  . Hypertension   . Stroke 02/2010    left brain CVA? recurrent TIA's treated with TPA  . Diabetes mellitus     insulin-dependent  . Dyslipidemia   . Premature ventricular contractions (PVCs) (VPCs)   . Arthritis   . Migraine headache   . GERD (gastroesophageal reflux disease)   . Coronary artery disease     stenting of right coronary artery. also diagonal stenosis - treated medically  . Hyperlipidemia   . Dilated cardiomyopathy     with an ejection fraction of around 15%  . Pulmonary hypertension   . Gout   .  Obesity   . DJD (degenerative joint disease)   . Thrombocytopenia   . Depression     Past Surgical History  Procedure Laterality Date  . Cardiac catheterization    . Tonsillectomy    . Oophorectomy      right  . Mass excision      left hip  . Cervical polypectomy    . Foot surgery    . US echocardiography  2011    History  Smoking status  . Former Smoker -- 1 years  . Types: Cigarettes  . Quit date: 04/23/1982  Smokeless tobacco  . Never Used    History  Alcohol Use No    Family History  Problem Relation Age of Onset  . Heart failure Mother   . Heart attack Father   . Heart disease Brother     Review of Systems: As noted in history of present illness. All other systems were reviewed and are  negative.  Physical Exam: BP 110/60  Pulse 104  Ht 5' (1.524 m)  Wt 188 lb 9.6 oz (85.548 kg)  BMI 36.83 kg/m2 She is an obese black female in no acute distress. HEENT exam is unremarkable. She has no JVD or bruits. Lungs are clear. Cardiac exam reveals a regular rate and rhythm without gallop, murmur, or click. ICD is in place. There is no swelling or tenderness around the ICD site. Abdomen is obese, soft, nontender. Pedal pulses are good. She has no edema. She is alert and oriented x3. She has a significant tremor which is more pronounced on the left.  LABORATORY DATA:   Assessment / Plan: 1. Chronic congestive heart failure with ejection fraction of 20-25%. She does not have any significant volume overload today. She is supposed to be back on her ARB but it is unclear from her medical record if this was resumed. Continue carvedilol and Lasix at current dose.  2. Coronary disease status post prior stenting of the distal and proximal RCA as noted above. Asymptomatic.  3. Hypertension blood pressure is well controlled.  4. Dyspnea. I think this is directly related to Brilinta. If she were to require intervention in the future I would recommend the use of Plavix. She cannot take Effient with her history of stroke.

## 2013-04-20 ENCOUNTER — Encounter: Payer: Self-pay | Admitting: Cardiology

## 2013-04-21 ENCOUNTER — Encounter: Payer: Self-pay | Admitting: Cardiology

## 2013-04-26 ENCOUNTER — Inpatient Hospital Stay (HOSPITAL_COMMUNITY)
Admission: EM | Admit: 2013-04-26 | Discharge: 2013-04-29 | DRG: 808 | Disposition: A | Discharge status: Home / Self Care | Payer: BC Managed Care – PPO | Attending: Cardiology | Admitting: Cardiology

## 2013-04-26 ENCOUNTER — Encounter (HOSPITAL_COMMUNITY): Payer: Self-pay | Admitting: Emergency Medicine

## 2013-04-26 ENCOUNTER — Emergency Department (HOSPITAL_COMMUNITY): Payer: BC Managed Care – PPO

## 2013-04-26 DIAGNOSIS — I2789 Other specified pulmonary heart diseases: Secondary | ICD-10-CM | POA: Diagnosis present

## 2013-04-26 DIAGNOSIS — F3289 Other specified depressive episodes: Secondary | ICD-10-CM | POA: Diagnosis present

## 2013-04-26 DIAGNOSIS — D696 Thrombocytopenia, unspecified: Secondary | ICD-10-CM | POA: Diagnosis present

## 2013-04-26 DIAGNOSIS — Z794 Long term (current) use of insulin: Secondary | ICD-10-CM

## 2013-04-26 DIAGNOSIS — I259 Chronic ischemic heart disease, unspecified: Secondary | ICD-10-CM | POA: Diagnosis present

## 2013-04-26 DIAGNOSIS — Z95810 Presence of automatic (implantable) cardiac defibrillator: Secondary | ICD-10-CM | POA: Diagnosis present

## 2013-04-26 DIAGNOSIS — G43909 Migraine, unspecified, not intractable, without status migrainosus: Secondary | ICD-10-CM | POA: Diagnosis present

## 2013-04-26 DIAGNOSIS — E119 Type 2 diabetes mellitus without complications: Secondary | ICD-10-CM | POA: Diagnosis present

## 2013-04-26 DIAGNOSIS — I959 Hypotension, unspecified: Secondary | ICD-10-CM

## 2013-04-26 DIAGNOSIS — E785 Hyperlipidemia, unspecified: Secondary | ICD-10-CM | POA: Diagnosis present

## 2013-04-26 DIAGNOSIS — N179 Acute kidney failure, unspecified: Secondary | ICD-10-CM | POA: Diagnosis not present

## 2013-04-26 DIAGNOSIS — K219 Gastro-esophageal reflux disease without esophagitis: Secondary | ICD-10-CM | POA: Diagnosis present

## 2013-04-26 DIAGNOSIS — I4949 Other premature depolarization: Secondary | ICD-10-CM | POA: Diagnosis present

## 2013-04-26 DIAGNOSIS — M1990 Unspecified osteoarthritis, unspecified site: Secondary | ICD-10-CM | POA: Diagnosis present

## 2013-04-26 DIAGNOSIS — I420 Dilated cardiomyopathy: Secondary | ICD-10-CM | POA: Diagnosis present

## 2013-04-26 DIAGNOSIS — I10 Essential (primary) hypertension: Secondary | ICD-10-CM | POA: Diagnosis present

## 2013-04-26 DIAGNOSIS — F329 Major depressive disorder, single episode, unspecified: Secondary | ICD-10-CM | POA: Diagnosis present

## 2013-04-26 DIAGNOSIS — N289 Disorder of kidney and ureter, unspecified: Secondary | ICD-10-CM | POA: Diagnosis present

## 2013-04-26 DIAGNOSIS — E669 Obesity, unspecified: Secondary | ICD-10-CM | POA: Diagnosis present

## 2013-04-26 DIAGNOSIS — I214 Non-ST elevation (NSTEMI) myocardial infarction: Secondary | ICD-10-CM | POA: Diagnosis present

## 2013-04-26 DIAGNOSIS — Z6837 Body mass index (BMI) 37.0-37.9, adult: Secondary | ICD-10-CM

## 2013-04-26 DIAGNOSIS — Z955 Presence of coronary angioplasty implant and graft: Secondary | ICD-10-CM

## 2013-04-26 DIAGNOSIS — I5022 Chronic systolic (congestive) heart failure: Secondary | ICD-10-CM

## 2013-04-26 DIAGNOSIS — M109 Gout, unspecified: Secondary | ICD-10-CM | POA: Diagnosis present

## 2013-04-26 DIAGNOSIS — D509 Iron deficiency anemia, unspecified: Secondary | ICD-10-CM | POA: Diagnosis present

## 2013-04-26 DIAGNOSIS — I2510 Atherosclerotic heart disease of native coronary artery without angina pectoris: Secondary | ICD-10-CM | POA: Diagnosis present

## 2013-04-26 DIAGNOSIS — I428 Other cardiomyopathies: Secondary | ICD-10-CM | POA: Diagnosis present

## 2013-04-26 DIAGNOSIS — M129 Arthropathy, unspecified: Secondary | ICD-10-CM | POA: Diagnosis present

## 2013-04-26 HISTORY — DX: Ischemic cardiomyopathy: I25.5

## 2013-04-26 HISTORY — DX: Disorder of kidney and ureter, unspecified: N28.9

## 2013-04-26 HISTORY — DX: Hypotension, unspecified: I95.9

## 2013-04-26 HISTORY — DX: Iron deficiency anemia, unspecified: D50.9

## 2013-04-26 LAB — CBC WITH DIFFERENTIAL/PLATELET
Eosinophils Relative: 2 % (ref 0–5)
Hemoglobin: 11.3 g/dL — ABNORMAL LOW (ref 12.0–15.0)
Lymphocytes Relative: 39 % (ref 12–46)
Lymphs Abs: 2.1 10*3/uL (ref 0.7–4.0)
MCV: 65.9 fL — ABNORMAL LOW (ref 78.0–100.0)
Monocytes Relative: 8 % (ref 3–12)
Neutrophils Relative %: 51 % (ref 43–77)
Platelets: 141 10*3/uL — ABNORMAL LOW (ref 150–400)
RBC: 5.07 MIL/uL (ref 3.87–5.11)
WBC: 5.3 10*3/uL (ref 4.0–10.5)

## 2013-04-26 LAB — PROTIME-INR
INR: 1.11 (ref 0.00–1.49)
Prothrombin Time: 14.2 seconds (ref 11.6–15.2)

## 2013-04-26 LAB — POCT I-STAT TROPONIN I

## 2013-04-26 LAB — BASIC METABOLIC PANEL
Chloride: 103 mEq/L (ref 96–112)
GFR calc Af Amer: 71 mL/min — ABNORMAL LOW (ref >90)
GFR calc non Af Amer: 61 mL/min — ABNORMAL LOW (ref >90)
Potassium: 3.5 mEq/L (ref 3.5–5.1)
Sodium: 141 mEq/L (ref 135–145)

## 2013-04-26 LAB — GLUCOSE, CAPILLARY
Glucose-Capillary: 114 mg/dL — ABNORMAL HIGH (ref 70–99)
Glucose-Capillary: 153 mg/dL — ABNORMAL HIGH (ref 70–99)

## 2013-04-26 LAB — TROPONIN I: Troponin I: 0.62 ng/mL (ref <0.30)

## 2013-04-26 LAB — MAGNESIUM: Magnesium: 1.5 mg/dL (ref 1.5–2.5)

## 2013-04-26 LAB — RETICULOCYTES: Retic Count, Absolute: 56.4 10*3/uL (ref 19.0–186.0)

## 2013-04-26 MED ORDER — ONDANSETRON HCL 4 MG/2ML IJ SOLN
4.0000 mg | Freq: Four times a day (QID) | INTRAMUSCULAR | Status: DC | PRN
  Administered 2013-04-27: 19:00:00 4 mg via INTRAVENOUS
  Filled 2013-04-26: qty 2

## 2013-04-26 MED ORDER — FUROSEMIDE 40 MG PO TABS
40.0000 mg | ORAL_TABLET | Freq: Two times a day (BID) | ORAL | Status: DC
  Administered 2013-04-26 – 2013-04-28 (×4): 40 mg via ORAL
  Filled 2013-04-26 (×6): qty 1

## 2013-04-26 MED ORDER — IRBESARTAN 150 MG PO TABS
150.0000 mg | ORAL_TABLET | Freq: Every day | ORAL | Status: DC
  Administered 2013-04-26 – 2013-04-27 (×2): 150 mg via ORAL
  Filled 2013-04-26 (×3): qty 1

## 2013-04-26 MED ORDER — DULOXETINE HCL 60 MG PO CPEP
60.0000 mg | ORAL_CAPSULE | Freq: Every day | ORAL | Status: DC
  Administered 2013-04-26 – 2013-04-29 (×4): 60 mg via ORAL
  Filled 2013-04-26 (×4): qty 1

## 2013-04-26 MED ORDER — SODIUM CHLORIDE 0.9 % IJ SOLN
3.0000 mL | Freq: Two times a day (BID) | INTRAMUSCULAR | Status: DC
  Administered 2013-04-26 – 2013-04-28 (×3): 3 mL via INTRAVENOUS

## 2013-04-26 MED ORDER — HEPARIN (PORCINE) IN NACL 100-0.45 UNIT/ML-% IJ SOLN
1300.0000 [IU]/h | INTRAMUSCULAR | Status: DC
  Administered 2013-04-26: 1000 [IU]/h via INTRAVENOUS
  Administered 2013-04-26: 800 [IU]/h via INTRAVENOUS
  Administered 2013-04-27: 1200 [IU]/h via INTRAVENOUS
  Filled 2013-04-26 (×3): qty 250

## 2013-04-26 MED ORDER — SODIUM CHLORIDE 0.9 % IV SOLN: 250.0000 mL | INTRAVENOUS | Status: DC | PRN

## 2013-04-26 MED ORDER — PANTOPRAZOLE SODIUM 40 MG PO TBEC
40.0000 mg | DELAYED_RELEASE_TABLET | Freq: Every day | ORAL | Status: DC
  Administered 2013-04-26 – 2013-04-29 (×4): 40 mg via ORAL
  Filled 2013-04-26 (×4): qty 1

## 2013-04-26 MED ORDER — ATORVASTATIN CALCIUM 40 MG PO TABS
40.0000 mg | ORAL_TABLET | Freq: Every day | ORAL | Status: DC
  Administered 2013-04-26 – 2013-04-28 (×3): 40 mg via ORAL
  Filled 2013-04-26 (×4): qty 1

## 2013-04-26 MED ORDER — FENOFIBRATE 160 MG PO TABS
160.0000 mg | ORAL_TABLET | Freq: Every day | ORAL | Status: DC
  Administered 2013-04-26 – 2013-04-29 (×4): 160 mg via ORAL
  Filled 2013-04-26 (×4): qty 1

## 2013-04-26 MED ORDER — CARVEDILOL 25 MG PO TABS
25.0000 mg | ORAL_TABLET | Freq: Two times a day (BID) | ORAL | Status: DC
  Administered 2013-04-27 – 2013-04-28 (×3): 25 mg via ORAL
  Filled 2013-04-26 (×5): qty 1

## 2013-04-26 MED ORDER — SODIUM CHLORIDE 0.9 % IJ SOLN: 3.0000 mL | INTRAMUSCULAR | Status: DC | PRN

## 2013-04-26 MED ORDER — ACETAMINOPHEN 325 MG PO TABS
650.0000 mg | ORAL_TABLET | ORAL | Status: DC | PRN
  Administered 2013-04-27: 650 mg via ORAL
  Filled 2013-04-26: qty 2

## 2013-04-26 MED ORDER — HEPARIN BOLUS VIA INFUSION
2000.0000 [IU] | Freq: Once | INTRAVENOUS | Status: AC
  Administered 2013-04-26: 2000 [IU] via INTRAVENOUS
  Filled 2013-04-26: qty 2000

## 2013-04-26 MED ORDER — NITROGLYCERIN 0.4 MG SL SUBL: 0.4000 mg | SUBLINGUAL_TABLET | SUBLINGUAL | Status: DC | PRN

## 2013-04-26 MED ORDER — BUPROPION HCL ER (XL) 150 MG PO TB24
150.0000 mg | ORAL_TABLET | Freq: Every day | ORAL | Status: DC
  Administered 2013-04-26 – 2013-04-29 (×4): 150 mg via ORAL
  Filled 2013-04-26 (×4): qty 1

## 2013-04-26 MED ORDER — NITROGLYCERIN IN D5W 200-5 MCG/ML-% IV SOLN
5.0000 ug/min | INTRAVENOUS | Status: DC
  Administered 2013-04-26: 5 ug/min via INTRAVENOUS
  Filled 2013-04-26: qty 250

## 2013-04-26 MED ORDER — HEPARIN BOLUS VIA INFUSION
3900.0000 [IU] | Freq: Once | INTRAVENOUS | Status: AC
  Administered 2013-04-26: 3900 [IU] via INTRAVENOUS

## 2013-04-26 MED ORDER — DIAZEPAM 5 MG PO TABS
5.0000 mg | ORAL_TABLET | ORAL | Status: AC
  Administered 2013-04-27: 5 mg via ORAL
  Filled 2013-04-26: qty 1

## 2013-04-26 MED ORDER — INSULIN ASPART 100 UNIT/ML ~~LOC~~ SOLN
0.0000 [IU] | Freq: Three times a day (TID) | SUBCUTANEOUS | Status: DC
  Administered 2013-04-27: 3 [IU] via SUBCUTANEOUS
  Administered 2013-04-27 – 2013-04-28 (×3): 2 [IU] via SUBCUTANEOUS
  Administered 2013-04-28 – 2013-04-29 (×3): 3 [IU] via SUBCUTANEOUS

## 2013-04-26 MED ORDER — SODIUM CHLORIDE 0.9 % IJ SOLN: 3.0000 mL | Freq: Two times a day (BID) | INTRAMUSCULAR | Status: DC

## 2013-04-26 NOTE — ED Notes (Signed)
C/o chest pain radiating to jaw, started on Friday. Constant since onset. EMS administered 3 nitro sublingual. Pain is now rated as a 0/10. #22 angiocath in left hand started by EMS.

## 2013-04-26 NOTE — ED Notes (Signed)
Dr.Pickering shown results of Istat Trop. Lab value. ED-Lab.

## 2013-04-26 NOTE — ED Provider Notes (Signed)
History     CSN: [id number]  Arrival date & time 04/26/13  0901   First MD Initiated Contact with Patient 04/26/13 [phone]      Chief Complaint  Patient presents with  . Chest Pain    (Consider location/radiation/quality/duration/timing/severity/associated sxs/prior treatment) Patient is a 62 y.o. female presenting with chest pain. The history is provided by the patient.  Chest Pain Pain location:  Substernal area and L chest Associated symptoms: shortness of breath   Associated symptoms: no abdominal pain, no back pain, no fever, no headache, no nausea, no numbness, not vomiting and no weakness    patient presents with a dull pain to her left chest that radiates to her left jaw. She states it has been worse with some exertion. She's had pain since Friday. She states that her partner made her come in because she said she "looked bad" she has had a mild shortness of breath with it. No fevers. No cough. She states it feels better she curls up into a ball. She had 3 nitroglycerin by EMS and states that she is pain-free now. She has an AICD and previous stents. She sees  Cardiology.   Past Medical History  Diagnosis Date  . ISCHEMIC HEART DISEASE 02/14/2011  . Chronic systolic heart failure 02/14/2011  . AUTOMATIC IMPLANTABLE CARDIAC DEFIBRILLATOR SITU 02/14/2011  . Hypertension   . Stroke 02/2010    left brain CVA? recurrent TIA's treated with TPA  . Diabetes mellitus     insulin-dependent  . Dyslipidemia   . Premature ventricular contractions (PVCs) (VPCs)   . Arthritis   . Migraine headache   . GERD (gastroesophageal reflux disease)   . Coronary artery disease     stenting of right coronary artery. also diagonal stenosis - treated medically  . Hyperlipidemia   . Dilated cardiomyopathy     with an ejection fraction of around 15%  . Pulmonary hypertension   . Gout   . Obesity   . DJD (degenerative joint disease)   . Thrombocytopenia   . Depression     Past Surgical  History  Procedure Laterality Date  . Cardiac catheterization    . Tonsillectomy    . Oophorectomy      right  . Mass excision      left hip  . Cervical polypectomy    . Foot surgery    . US echocardiography  2011    Family History  Problem Relation Age of Onset  . Heart failure Mother   . Heart attack Father   . Heart disease Brother     History  Substance Use Topics  . Smoking status: Former Smoker -- 1 years    Types: Cigarettes    Quit date: 04/23/1982  . Smokeless tobacco: Never Used  . Alcohol Use: No    OB History   Grav Para Term Preterm Abortions TAB SAB Ect Mult Living                  Review of Systems  Constitutional: Negative for fever, activity change and appetite change.  HENT: Negative for neck stiffness.   Eyes: Negative for pain.  Respiratory: Positive for shortness of breath. Negative for chest tightness.   Cardiovascular: Positive for chest pain. Negative for leg swelling.  Gastrointestinal: Negative for nausea, vomiting, abdominal pain and diarrhea.  Genitourinary: Negative for flank pain.  Musculoskeletal: Negative for back pain.  Skin: Negative for rash.  Neurological: Negative for weakness, numbness and headaches.  Psychiatric/Behavioral: Negative for  behavioral problems.    Allergies  Aspirin; Brilinta; Percocet; Darvon; Imitrex; and Nsaids  Home Medications   Current Outpatient Rx  Name  Route  Sig  Dispense  Refill  . acetaminophen (TYLENOL) 650 MG CR tablet   Oral   Take 1,300 mg by mouth every 8 (eight) hours as needed for pain.         Marland Kitchen atorvastatin (LIPITOR) 40 MG tablet   Oral   Take 1 tablet by mouth at bedtime.         Marland Kitchen buPROPion (WELLBUTRIN XL) 150 MG 24 hr tablet   Oral   Take 150 mg by mouth daily.          . carvedilol (COREG) 25 MG tablet   Oral   Take 25 mg by mouth 2 (two) times daily with a meal.         . DULoxetine (CYMBALTA) 60 MG capsule   Oral   Take 60 mg by mouth daily.           .  fenofibrate (TRICOR) 145 MG tablet   Oral   Take 1 tablet (145 mg total) by mouth daily.   30 tablet   5     90 day supply is acceptable   . furosemide (LASIX) 40 MG tablet   Oral   Take 40 mg by mouth 2 (two) times daily.          Marland Kitchen JANUVIA 100 MG tablet   Oral   Take 1 tablet by mouth Daily.         . nitroGLYCERIN (NITROSTAT) 0.4 MG SL tablet   Sublingual   Place 0.4 mg under the tongue every 5 (five) minutes as needed. For chest pain         . olmesartan (BENICAR) 20 MG tablet   Oral   Take 20 mg by mouth daily.         . pantoprazole (PROTONIX) 40 MG tablet   Oral   Take 40 mg by mouth daily.           BP 115/73  Pulse 87  Temp(Src) 98.2 F (36.8 C) (Oral)  Resp 20  SpO2 98%  Physical Exam  Nursing note and vitals reviewed. Constitutional: She is oriented to person, place, and time. She appears well-developed and well-nourished.  HENT:  Head: Normocephalic and atraumatic.  Eyes: EOM are normal. Pupils are equal, round, and reactive to light.  Neck: Normal range of motion. Neck supple.  Cardiovascular: Normal rate, regular rhythm and normal heart sounds.   No murmur heard. Pulmonary/Chest: Effort normal and breath sounds normal. No respiratory distress. She has no wheezes. She has no rales.  Abdominal: Soft. Bowel sounds are normal. She exhibits no distension. There is no tenderness. There is no rebound and no guarding.  Musculoskeletal: Normal range of motion.  Neurological: She is alert and oriented to person, place, and time. No cranial nerve deficit.  Skin: Skin is warm and dry.  Psychiatric: She has a normal mood and affect. Her speech is normal.    ED Course  Procedures (including critical care time)  Labs Reviewed  CBC WITH DIFFERENTIAL - Abnormal; Notable for the following:    Hemoglobin 11.3 (*)    HCT 33.4 (*)    MCV 65.9 (*)    MCH 22.3 (*)    RDW 16.8 (*)    Platelets 141 (*)    All other components within normal limits   BASIC METABOLIC PANEL - Abnormal;  Notable for the following:    Glucose, Bld 177 (*)    BUN 29 (*)    GFR calc non Af Amer 61 (*)    GFR calc Af Amer 71 (*)    All other components within normal limits  TROPONIN I - Abnormal; Notable for the following:    Troponin I 0.69 (*)    All other components within normal limits  POCT I-STAT TROPONIN I - Abnormal; Notable for the following:    Troponin i, poc 0.25 (*)    All other components within normal limits  PROTIME-INR  APTT   Dg Chest 2 View  04/26/2013  *RADIOLOGY REPORT*  Clinical Data: Chest pain and shortness of breath.  CHEST - 2 VIEW  Comparison: Chest x-ray 06/25/2011.  Findings: Lung volumes are normal.  No consolidative airspace disease.  No pleural effusions.  Bibasilar linear opacities are compatible with subsegmental atelectasis and/or scarring.  No evidence of pulmonary edema.  Mild cardiomegaly is unchanged. Mediastinal contours are unremarkable.  Left-sided pacemaker / AICD with lead tip projecting over the expected location of the right ventricular apex.  Orthopedic fixation hardware in the lower cervical spine.  IMPRESSION: 1.  No radiographic evidence of acute cardiopulmonary disease. 2.  Mild cardiomegaly. 3.  Postoperative changes and support apparatus, as above.   Original Report Authenticated By: Trudie Reed, M.D.      1. NSTEMI (non-ST elevated myocardial infarction)      Date: 04/26/2013 0906  Rate: 87  Rhythm: normal sinus rhythm  QRS Axis: normal  Intervals: normal  ST/T Wave abnormalities: nonspecific ST/T changes  Conduction Disutrbances:none  Narrative Interpretation: Patient has LVH and some new nonspecific ST changes laterally  Old EKG Reviewed: changes noted   Date: 04/26/2013 1036  Rate: 94  Rhythm: normal sinus rhythm  QRS Axis: normal  Intervals: normal  ST/T Wave abnormalities: nonspecific ST/T changes  Conduction Disutrbances:none  Narrative Interpretation: Unchanged from previous EKG  today.  Old EKG Reviewed: unchanged      MDM  Patient with chest pain. Began on Friday. Has been constant. EKG shows nonspecific ST depression. Patient's pain was initially relieved by EMS nitroglycerin. After discussion with cardiology and a positive troponin patient was started on heparin and nitroglycerin. Patient's chest pain return before the nitroglycerin drip was started. That chest pain has resolved but she now has nausea. Repeat EKG showed similar changes. Patient will be admitted to the hospital. She is unable to get aspirin due to an allergy.        Juliet Rude. Rubin Payor, MD 04/26/13 1109

## 2013-04-26 NOTE — H&P (Signed)
History and Physical   Patient ID: Andrea [name] MRN: [id number], DOB/AGE: 62-Apr-1952   Admit date: 04/26/2013 Date of Consult: 04/26/2013   Primary Physician: Lolita Patella, MD Primary Cardiologist: P. Swaziland, MD  HPI: Andrea [name] is a 62 y.o. female who presents to [facility] ED today with recurrent chest pain.   She has a significant cardiac history. She has a history of CAD s/p DES-dRCA in 11/2010, DES-pRCA 04/2011 (in an area free of disease on prior cath) and nonobs dz 06/2011 on repeat cath. She has an ischemic cardiomyopathy (EF 20-25% s/p Medtronic single chamber ICD). She has multiple cardiac RFs including IDDM, HLD, HTN, obesity. She has a prior history of CVA. She had admission in 09/2012 for marked hypotension and acute renal failure. ARB held with improvement. Lasix and BB restarted. Brilinta has been held due to dyspnea which relieved this adverse reaction. H/o CVA precludes Effient. ASA intolerance/allergy documented. Plavix recommended if repeat intervention required per Dr. Swaziland.   She was seen earlier this month in the office by Dr. Swaziland. She was found to be euvolemic and asymptomatic. ARB was resumed.   She was in her USOH until Friday when she began developing intermittent substernal sharp/stabbing chest pain radiating to her upper chest, neck and jaw with associated SOB and nausea rated at a 10/10. Episodes varied from 45 to 90 minutes. Aggravated by exertion, alleviated by NTG. This was described as different in quality from her prior ACS/angina. She does note orthopnea and occasional dizziness. No ICD shock. Denies edema, palpitations or syncope. Unsure of weight gain. No fevers, chills, v/d/urinary changes or active bleeding. Her symptoms worsened through the weekend, occasionally waking her from sleep, and she called EMS.   On arrival to the ED, EKG reveals more pronounced anterolateral ST/T changes. POC TnI 0.25, subsequent trop-I 0.69. CBC-  MCV 65.9, Hgb 11.3, Hct 33.4. BMET unremarkable- BUN 29/Cr 0.98. CXR w/o radiographic evidence of acute cardiopulmonary disease, mild cardiomegaly and post op changes from ICD. VSS. She was was started on heparin and NTG gtt. Currently chest pain free.   Problem List: Past Medical History  Diagnosis Date  . ISCHEMIC HEART DISEASE 02/14/2011  . Chronic systolic heart failure 02/14/2011  . AUTOMATIC IMPLANTABLE CARDIAC DEFIBRILLATOR SITU 02/14/2011  . Hypertension   . Stroke 02/2010    left brain CVA? recurrent TIA's treated with TPA  . Diabetes mellitus     insulin-dependent  . Dyslipidemia   . Premature ventricular contractions (PVCs) (VPCs)   . Arthritis   . Migraine headache   . GERD (gastroesophageal reflux disease)   . Coronary artery disease     stenting of right coronary artery. also diagonal stenosis - treated medically  . Hyperlipidemia   . Dilated cardiomyopathy     with an ejection fraction of around 15%  . Pulmonary hypertension   . Gout   . Obesity   . DJD (degenerative joint disease)   . Thrombocytopenia   . Depression     Past Surgical History  Procedure Laterality Date  . Cardiac catheterization    . Tonsillectomy    . Oophorectomy      right  . Mass excision      left hip  . Cervical polypectomy    . Foot surgery    . US echocardiography  2011     Allergies:  Allergies  Allergen Reactions  . Aspirin Shortness Of Breath and Other (See Comments)    Asthma symptoms  . Brilinta (  Ticagrelor) Shortness Of Breath and Other (See Comments)    Gout   . Percocet (Oxycodone-Acetaminophen) Nausea And Vomiting  . Darvon Nausea And Vomiting  . Imitrex (Sumatriptan Base) Other (See Comments)    Unknown reaction  . Nsaids Nausea And Vomiting    Home Medications: Prior to Admission medications   Medication Sig Start Date End Date Taking? Authorizing Provider  acetaminophen (TYLENOL) 650 MG CR tablet Take 1,300 mg by mouth every 8 (eight) hours as needed for  pain.   Yes Historical Provider, MD  atorvastatin (LIPITOR) 40 MG tablet Take 1 tablet by mouth at bedtime. 04/10/11  Yes Historical Provider, MD  buPROPion (WELLBUTRIN XL) 150 MG 24 hr tablet Take 150 mg by mouth daily.  11/19/12  Yes Historical Provider, MD  carvedilol (COREG) 25 MG tablet Take 25 mg by mouth 2 (two) times daily with a meal.   Yes Historical Provider, MD  DULoxetine (CYMBALTA) 60 MG capsule Take 60 mg by mouth daily.     Yes Historical Provider, MD  fenofibrate (TRICOR) 145 MG tablet Take 1 tablet (145 mg total) by mouth daily. 01/13/13  Yes Peter M Swaziland, MD  furosemide (LASIX) 40 MG tablet Take 40 mg by mouth 2 (two) times daily.  10/03/12  Yes Marinda Elk, MD  JANUVIA 100 MG tablet Take 1 tablet by mouth Daily. 03/31/11  Yes Historical Provider, MD  nitroGLYCERIN (NITROSTAT) 0.4 MG SL tablet Place 0.4 mg under the tongue every 5 (five) minutes as needed. For chest pain   Yes Historical Provider, MD  olmesartan (BENICAR) 20 MG tablet Take 20 mg by mouth daily.   Yes Historical Provider, MD  pantoprazole (PROTONIX) 40 MG tablet Take 40 mg by mouth daily.   Yes Historical Provider, MD    Inpatient Medications:     (Not in a hospital admission)  Family History  Problem Relation Age of Onset  . Heart failure Mother   . Heart attack Father   . Heart disease Brother      History   Social History  . Marital Status: Divorced    Spouse Name: N/A    Number of Children: 1  . Years of Education: N/A   Occupational History  .     Social History Main Topics  . Smoking status: Former Smoker -- 1 years    Types: Cigarettes    Quit date: 04/23/1982  . Smokeless tobacco: Never Used  . Alcohol Use: No  . Drug Use: No  . Sexually Active:    Other Topics Concern  . Not on file   Social History Narrative  . No narrative on file     Review of Systems: General: negative for chills, fever, night sweats or weight changes.  Cardiovascular: positive for chest pain,  shortness of breath, orthopnea, negative for edema, palpitations Dermatological: negative for rash Respiratory: positive for wheezing, negative for cough Urologic: negative for hematuria Abdominal: positive for nausea, negative for vomiting, diarrhea, bright red blood per rectum, melena, or hematemesis Neurologic: positive for dizziness, negative for visual changes, syncope All other systems reviewed and are otherwise negative except as noted above.  Physical Exam: Blood pressure 115/73, pulse 87, temperature 98.2 F (36.8 C), temperature source Oral, resp. rate 20, SpO2 98.00%.    General: Well developed, obese, in no acute distress. Head: Normocephalic, atraumatic, sclera non-icteric, no xanthomas, nares are without discharge.  Neck: Negative for carotid bruits. JVD not elevated. Lungs: Trace exp wheezing, no appreciable rales or rhonchi. Breathing is  unlabored. Heart: RRR with S1 S2. No murmurs, rubs, or gallops appreciated. Abdomen: Soft, non-tender, non-distended with normoactive bowel sounds. No hepatomegaly. No rebound/guarding. No obvious abdominal masses. Msk:  Strength and tone appears normal for age. Extremities: No clubbing, cyanosis or edema.  Distal pedal pulses are 2+ and equal bilaterally, PT pulses are 2+ on R, trace on L. Neuro: Alert and oriented X 3. Moves all extremities spontaneously. Psych:  Responds to questions appropriately with a normal affect.  Labs: Recent Labs     04/26/13  0924  WBC  5.3  HGB  11.3*  HCT  33.4*  MCV  65.9*  PLT  141*   Recent Labs Lab 04/26/13 0924  NA 141  K 3.5  CL 103  CO2 29  BUN 29*  CREATININE 0.98  CALCIUM 10.0  GLUCOSE 177*   Recent Labs     04/26/13  0924  TROPONINI  0.69*   Radiology/Studies: Dg Chest 2 View  04/26/2013  *RADIOLOGY REPORT*  Clinical Data: Chest pain and shortness of breath.  CHEST - 2 VIEW  Comparison: Chest x-ray 06/25/2011.  Findings: Lung volumes are normal.  No consolidative airspace  disease.  No pleural effusions.  Bibasilar linear opacities are compatible with subsegmental atelectasis and/or scarring.  No evidence of pulmonary edema.  Mild cardiomegaly is unchanged. Mediastinal contours are unremarkable.  Left-sided pacemaker / AICD with lead tip projecting over the expected location of the right ventricular apex.  Orthopedic fixation hardware in the lower cervical spine.  IMPRESSION: 1.  No radiographic evidence of acute cardiopulmonary disease. 2.  Mild cardiomegaly. 3.  Postoperative changes and support apparatus, as above.   Original Report Authenticated By: Trudie Reed, M.D.     EKG: NSR, 94 bpm, flat/downsloping ST depressions V4-V6, I, aVL, borderline LAD  ASSESSMENT AND PLAN:   62 y.o. Female with a significant cardiac history including CAD s/p multiple PCIs, ischemic cardiomyopathy (EF 25% s/p ICD), h/o CVA and multiple cardiac RFs who presents to [facility] ED today with recurrent chest pain.   1. NSTEMI 2. CAD s/p DES-distal & mid RCA in 2011 and 2012, respectively 3. Ischemic cardiomyopathy, EF 25% 4. S/p Medtronic single-chamber ICD 5. IDDM  -- Noted in history, only on oral hypoglycemic outpatient 6. HTN 7. HLD 8. Obesity 9. H/o CVA 10. Microcytic anemia 11. Chronic thrombocytopenia 12. ASA intolerance 13. Brilinta intolerance  The patient presents to the ED today after experiencing worsening, intermittent episode of sharp substernal chest pain radiating to her neck and jaw with associated nausea and shortness of breath, aggravated by exertion and relieved with NTG. She has an extensive CAD history. She has a history of rapid interval plaque development appreciated between 2011 and 2012 caths to the prox RCA. EKG and cardiac biomarkers support cardiac ischemia. She is currently chest pain free on NTG gtt. Heparin gtt is infused and managed by pharmacy. Will plan for cardiac catheterization tomorrow. Will evaluate for stent patency and development of  novel lesions. Cycle cardiac biomarkers. Continue outpatient medications including ARB, BB, statin. Consider starting on daily Plavix with ASA/Brilinta intolerance and inability to take Effient w/ CVA history. Will hold off on this as she may require Plavix loading tomorrow at cath. Repeat lipid panel tomorrow AM. Check TSH and A1C. Currently appropriate for telemetry floor as she is pain free and hemodynamically stable. This may be upgraded to stepdown if chest pain becomes difficult to control. If she develops accelerating angina +/- marked troponin elevation, will plan for urgent  cath tonight. Will tentatively schedule procedure for tomorrow. Heart healthy diet now, NPO at midnight. SSI while inpatient. From an hematologic standpoint, MCV has progressively declined since 2011. Denies active bleeding. She is postmenopausal. Will check FOBT, iron studies via anemia panel. Watch PLTs with heparin.   Signed, R. Hurman Horn, PA-C 04/26/2013, 12:17 PM  Patient seen and examined. I agree with the assessment and plan as detailed above. See also my additional thoughts below.   Please refer to a progress note that I had already written today before the history and physical was completed. The patient is stable now. Her presenting pain was concerning. It is relieved with nitroglycerin. She continues on IV nitroglycerin and heparin at this time. She's not having any recurrent pain. We will arrange for cardiac catheterization hopefully tomorrow. If she has any return of significant pain, we will push for earlier catheterization.  Willa Rough, MD, [facility] 04/26/2013 1:41 PM

## 2013-04-26 NOTE — Progress Notes (Signed)
ANTICOAGULATION CONSULT NOTE - Follow-up  Pharmacy Consult for heparin Indication: chest pain/ACS  Allergies  Allergen Reactions  . Aspirin Shortness Of Breath and Other (See Comments)    Asthma symptoms  . Brilinta (Ticagrelor) Shortness Of Breath and Other (See Comments)    Gout   . Percocet (Oxycodone-Acetaminophen) Nausea And Vomiting  . Darvon Nausea And Vomiting  . Imitrex (Sumatriptan Base) Other (See Comments)    Unknown reaction  . Nsaids Nausea And Vomiting    Patient Measurements: Patient stated weight: 186lb Patient stated height: 5' Heparin Dosing Weight: 65kg  Vital Signs: Temp: 98.1 F (36.7 C) (04/27 1500) Temp src: Oral (04/27 1500) BP: 124/77 mmHg (04/27 1500) Pulse Rate: 101 (04/27 1500)  Labs:  Recent Labs  04/26/13 0924 04/26/13 1645  HGB 11.3*  --   HCT 33.4*  --   PLT 141*  --   APTT 30  --   LABPROT 14.2  --   INR 1.11  --   HEPARINUNFRC  --  0.10*  CREATININE 0.98  --   TROPONINI 0.69*  --    Assessment: 61 yof continues on IV heparin for NSTEMI. Initial heparin level is subtherapeutic at 0.1. No bleeding noted, no problems with the line per RN report. Baseline H/H and plts slightly low. Noted plans for possible cardiac cath tomorrow.   Goal of Therapy:  Heparin level 0.3-0.7 units/ml Monitor platelets by anticoagulation protocol: Yes   Plan:  1. Heparin bolus 2000 units IV x 1 2. Increase heparin gtt to 1000 units/hr 3. Check a 6 hour heparin level  Lysle Pearl, PharmD, BCPS Pager # [phone] 04/26/2013 5:19 PM

## 2013-04-26 NOTE — Progress Notes (Signed)
ANTICOAGULATION CONSULT NOTE - Initial Consult  Pharmacy Consult for heparin Indication: chest pain/ACS  Allergies  Allergen Reactions  . Aspirin Shortness Of Breath and Other (See Comments)    Asthma symptoms  . Brilinta (Ticagrelor) Shortness Of Breath and Other (See Comments)    Gout   . Percocet (Oxycodone-Acetaminophen) Nausea And Vomiting  . Darvon Nausea And Vomiting  . Imitrex (Sumatriptan Base) Other (See Comments)    Unknown reaction  . Nsaids Nausea And Vomiting    Patient Measurements: Patient stated weight: 186lb Patient stated height: 5' Heparin Dosing Weight: 65kg  Vital Signs: Temp: 98.2 F (36.8 C) (04/27 0909) Temp src: Oral (04/27 0909) BP: 115/73 mmHg (04/27 0909) Pulse Rate: 87 (04/27 0909)  Labs:  Recent Labs  04/26/13 0924  HGB 11.3*  HCT 33.4*  PLT 141*  APTT Andrea  LABPROT 14.2  INR 1.11  CREATININE 0.98    Medical History: Past Medical History  Diagnosis Date  . ISCHEMIC HEART DISEASE 02/14/2011  . Chronic systolic heart failure 02/14/2011  . AUTOMATIC IMPLANTABLE CARDIAC DEFIBRILLATOR SITU 02/14/2011  . Hypertension   . Stroke 02/2010    left brain CVA? recurrent TIA's treated with TPA  . Diabetes mellitus     insulin-dependent  . Dyslipidemia   . Premature ventricular contractions (PVCs) (VPCs)   . Arthritis   . Migraine headache   . GERD (gastroesophageal reflux disease)   . Coronary artery disease     stenting of right coronary artery. also diagonal stenosis - treated medically  . Hyperlipidemia   . Dilated cardiomyopathy     with an ejection fraction of around 15%  . Pulmonary hypertension   . Gout   . Obesity   . DJD (degenerative joint disease)   . Thrombocytopenia   . Depression     Assessment: Andrea [name] admitted with radiating chest pain to start IV heparin. Baseline INR 1.1, aPTT Andrea seconds.   Goal of Therapy:  Heparin level 0.3-0.7 units/ml Monitor platelets by anticoagulation protocol: Yes   Plan:  1. Bolus  with IV heparin 3900 units x1 2. Start heparin drip at 800 units/hr 3. 6 hour heparin level 4. Daily heparin level and CBC

## 2013-04-26 NOTE — Progress Notes (Signed)
Patient ID: Andrea [name], female   DOB: 17-Jul-1951, 62 y.o.   MRN: [id number]   I have seen and examined the patient in the emergency room. The complete history and physical note is still pending. The patient has known coronary disease. She's been having intermittent chest discomfort for 2 days. Nitroglycerin helped today. She's now on IV heparin and IV nitroglycerin. There is some troponin elevation. She has a few wheezes on exam but no signs of marked heart failure.  The patient be treated with IV heparin and IV nitroglycerin. She'll be admitted with non-STEMI. Plans will be for cardiac catheterization during this admission.  Jerral Bonito, MD

## 2013-04-27 ENCOUNTER — Encounter (HOSPITAL_COMMUNITY)
Admission: EM | Disposition: A | Discharge status: Home / Self Care | Payer: Self-pay | Source: Home / Self Care | Attending: Cardiology

## 2013-04-27 DIAGNOSIS — I214 Non-ST elevation (NSTEMI) myocardial infarction: Secondary | ICD-10-CM

## 2013-04-27 DIAGNOSIS — I2510 Atherosclerotic heart disease of native coronary artery without angina pectoris: Secondary | ICD-10-CM

## 2013-04-27 HISTORY — PX: LEFT HEART CATHETERIZATION WITH CORONARY ANGIOGRAM: SHX5451

## 2013-04-27 HISTORY — PX: PERCUTANEOUS CORONARY STENT INTERVENTION (PCI-S): SHX5485

## 2013-04-27 LAB — CBC
Hemoglobin: 11.4 g/dL — ABNORMAL LOW (ref 12.0–15.0)
MCV: 66 fL — ABNORMAL LOW (ref 78.0–100.0)
Platelets: 162 10*3/uL (ref 150–400)
RBC: 5.12 MIL/uL — ABNORMAL HIGH (ref 3.87–5.11)
WBC: 6 10*3/uL (ref 4.0–10.5)

## 2013-04-27 LAB — BASIC METABOLIC PANEL
CO2: 28 mEq/L (ref 19–32)
Chloride: 102 mEq/L (ref 96–112)
Creatinine, Ser: 1 mg/dL (ref 0.50–1.10)
GFR calc Af Amer: 69 mL/min — ABNORMAL LOW (ref >90)
Potassium: 3.7 mEq/L (ref 3.5–5.1)
Sodium: 139 mEq/L (ref 135–145)

## 2013-04-27 LAB — IRON AND TIBC
Iron: 92 ug/dL (ref 42–135)
TIBC: 402 ug/dL (ref 250–470)

## 2013-04-27 LAB — GLUCOSE, CAPILLARY
Glucose-Capillary: 133 mg/dL — ABNORMAL HIGH (ref 70–99)
Glucose-Capillary: 97 mg/dL (ref 70–99)

## 2013-04-27 LAB — LIPID PANEL
LDL Cholesterol: 86 mg/dL (ref 0–99)
VLDL: 48 mg/dL — ABNORMAL HIGH (ref 0–40)

## 2013-04-27 LAB — VITAMIN B12: Vitamin B-12: 432 pg/mL (ref 211–911)

## 2013-04-27 LAB — TROPONIN I: Troponin I: 0.78 ng/mL (ref <0.30)

## 2013-04-27 LAB — FOLATE: Folate: 10.8 ng/mL

## 2013-04-27 LAB — HEMOGLOBIN A1C
Hgb A1c MFr Bld: 6.7 % — ABNORMAL HIGH (ref <5.7)
Mean Plasma Glucose: 146 mg/dL — ABNORMAL HIGH (ref <117)

## 2013-04-27 LAB — POCT ACTIVATED CLOTTING TIME: Activated Clotting Time: 426 seconds

## 2013-04-27 SURGERY — LEFT HEART CATHETERIZATION WITH CORONARY ANGIOGRAM
Anesthesia: LOCAL

## 2013-04-27 MED ORDER — FENTANYL CITRATE 0.05 MG/ML IJ SOLN
INTRAMUSCULAR | Status: AC
  Filled 2013-04-27: qty 2

## 2013-04-27 MED ORDER — ONDANSETRON HCL 4 MG/2ML IJ SOLN
INTRAMUSCULAR | Status: AC
  Filled 2013-04-27: qty 2

## 2013-04-27 MED ORDER — CLOPIDOGREL BISULFATE 75 MG PO TABS
75.0000 mg | ORAL_TABLET | Freq: Every day | ORAL | Status: DC
  Administered 2013-04-28 – 2013-04-29 (×2): 75 mg via ORAL
  Filled 2013-04-27 (×2): qty 1

## 2013-04-27 MED ORDER — LIDOCAINE HCL (PF) 1 % IJ SOLN
INTRAMUSCULAR | Status: AC
  Filled 2013-04-27: qty 30

## 2013-04-27 MED ORDER — HEPARIN (PORCINE) IN NACL 2-0.9 UNIT/ML-% IJ SOLN
INTRAMUSCULAR | Status: AC
  Filled 2013-04-27: qty 1000

## 2013-04-27 MED ORDER — CLOPIDOGREL BISULFATE 75 MG PO TABS
600.0000 mg | ORAL_TABLET | Freq: Once | ORAL | Status: AC
  Administered 2013-04-27: 600 mg via ORAL
  Filled 2013-04-27: qty 8

## 2013-04-27 MED ORDER — BIVALIRUDIN 250 MG IV SOLR
INTRAVENOUS | Status: AC
  Filled 2013-04-27: qty 250

## 2013-04-27 MED ORDER — ATROPINE SULFATE 1 MG/ML IJ SOLN
INTRAMUSCULAR | Status: AC
  Filled 2013-04-27: qty 1

## 2013-04-27 MED ORDER — SODIUM CHLORIDE 0.9 % IV SOLN: INTRAVENOUS | Status: AC

## 2013-04-27 MED ORDER — SODIUM CHLORIDE 0.9 % IV SOLN: INTRAVENOUS | Status: DC

## 2013-04-27 MED ORDER — MIDAZOLAM HCL 2 MG/2ML IJ SOLN
INTRAMUSCULAR | Status: AC
  Filled 2013-04-27: qty 2

## 2013-04-27 NOTE — H&P (View-Only) (Signed)
 TELEMETRY: Reviewed telemetry pt in NSR: Filed Vitals:   04/26/13 2100 04/27/13 0100 04/27/13 0600 04/27/13 0753  BP: 122/81 127/89 110/72 107/71  Pulse: 100 103 93 99  Temp: 98 F (36.7 C)  97.4 F (36.3 C) 98.1 F (36.7 C)  TempSrc: Oral  Oral Oral  Resp: 18 18 18 18  Height:      Weight:   187 lb 6.3 oz (85 kg)   SpO2: 100% 100% 100% 100%    Intake/Output Summary (Last 24 hours) at 04/27/13 0758 Last data filed at 04/27/13 0500  Gross per 24 hour  Intake    120 ml  Output    350 ml  Net   -230 ml    SUBJECTIVE Feels better. Reports some recurrent chest pain and orthopnea since admission.  LABS: Basic Metabolic Panel:  Recent Labs  04/26/13 0924 04/26/13 1804 04/27/13 0523  NA 141  --  139  K 3.5  --  3.7  CL 103  --  102  CO2 29  --  28  GLUCOSE 177*  --  145*  BUN 29*  --  22  CREATININE 0.98  --  1.00  CALCIUM 10.0  --  9.4  MG  --  1.5  --    CBC:  Recent Labs  04/26/13 0924 04/27/13 0523  WBC 5.3 6.0  NEUTROABS 2.7  --   HGB 11.3* 11.4*  HCT 33.4* 33.8*  MCV 65.9* 66.0*  PLT 141* 162   Cardiac Enzymes:  Recent Labs  04/26/13 0924 04/26/13 1804 04/27/13 0011  TROPONINI 0.69* 0.62* 0.78*   BNP: 573 Hemoglobin A1C:  Recent Labs  04/26/13 1804  HGBA1C 6.7*   Fasting Lipid Panel:  Recent Labs  04/27/13 0523  CHOL 163  HDL 29*  LDLCALC 86  TRIG 242*  CHOLHDL 5.6   Thyroid Function Tests:  Recent Labs  04/26/13 1804  TSH 0.870   Anemia Panel:  Recent Labs  04/26/13 1804  VITAMINB12 432  FOLATE 10.8  FERRITIN 99  TIBC 402  IRON 92  RETICCTPCT 1.1    Radiology/Studies:  Dg Chest 2 View  04/26/2013  *RADIOLOGY REPORT*  Clinical Data: Chest pain and shortness of breath.  CHEST - 2 VIEW  Comparison: Chest x-ray 06/25/2011.  Findings: Lung volumes are normal.  No consolidative airspace disease.  No pleural effusions.  Bibasilar linear opacities are compatible with subsegmental atelectasis and/or scarring.  No  evidence of pulmonary edema.  Mild cardiomegaly is unchanged. Mediastinal contours are unremarkable.  Left-sided pacemaker / AICD with lead tip projecting over the expected location of the right ventricular apex.  Orthopedic fixation hardware in the lower cervical spine.  IMPRESSION: 1.  No radiographic evidence of acute cardiopulmonary disease. 2.  Mild cardiomegaly. 3.  Postoperative changes and support apparatus, as above.   Original Report Authenticated By: Daniel Entrikin, M.D.    Ecg: NSR, LVH with repolarization abnormality and QRS widening.  PHYSICAL EXAM General: Well developed, well nourished, in no acute distress. Head: Normocephalic, atraumatic, sclera non-icteric, no xanthomas, nares are without discharge. Neck: Negative for carotid bruits. JVD not elevated. Lungs: Clear bilaterally to auscultation without wheezes, rales, or rhonchi. Breathing is unlabored. Heart: RRR S1 S2 without murmurs, rubs, or gallops.  Abdomen: Soft, non-tender, non-distended with normoactive bowel sounds. No hepatomegaly. No rebound/guarding. No obvious abdominal masses. Msk:  Strength and tone appears normal for age. Extremities: No clubbing, cyanosis or edema.  Distal pedal pulses are 2+ and equal bilaterally. Neuro: Alert   and oriented X 3. Moves all extremities spontaneously. Psych:  Responds to questions appropriately with a normal affect.  ASSESSMENT AND PLAN: 1.NSTEMI. On IV Ntg and heparin. Will plan cardiac cath/PCI today. Load with Plavix 600 mg.  2. CAD s/p DES of distal RCA 2011, proximal RCA 2012. 3. Cardiomyopathy. EF 25% 4. S/p ICD prophylactic 5. IDDM 6. Htn 7. Microcytic anemia. Probably chronic disease. Iron stores are OK. 8. History of ASA and Brilinta intolerance.  Principal Problem:   NSTEMI (non-ST elevated myocardial infarction) Active Problems:   Coronary artery disease   ISCHEMIC HEART DISEASE   Hypertension   Diabetes mellitus type 2, controlled   Dilated cardiomyopathy    ICD-Medtronic    Signed, Avrey Hyser MD,FACC 04/27/2013 7:59 AM    

## 2013-04-27 NOTE — Progress Notes (Signed)
ANTICOAGULATION CONSULT NOTE - Follow Up Consult  Pharmacy Consult for heparin Indication: NSTEMI  Labs:  Recent Labs  04/26/13 0924 04/26/13 1645 04/26/13 1804 04/27/13 0011  HGB 11.3*  --   --   --   HCT 33.4*  --   --   --   PLT 141*  --   --   --   APTT 30  --   --   --   LABPROT 14.2  --   --   --   INR 1.11  --   --   --   HEPARINUNFRC  --  0.10*  --  0.23*  CREATININE 0.98  --   --   --   TROPONINI 0.69*  --  0.62* 0.78*    Assessment: 61yo female remains subtherapeutic on heparin after rate increase though approaching goal; pt continues to c/o CP with no EKG changes.  Goal of Therapy:  Heparin level 0.3-0.7 units/ml   Plan:  Will increase heparin gtt by 2-3 units/kg/hr to 1200 units/hr and check level in 6hr.  Vernard Gambles, PharmD, BCPS  04/27/2013,2:38 AM

## 2013-04-27 NOTE — CV Procedure (Addendum)
   Cardiac Catheterization Operative Report  ANAY WALTER [id number] 4/28/20143:41 PM Lolita Patella, MD  Procedure Performed:  1. Left Heart Catheterization 2. Selective Coronary Angiography 3. Left ventricular angiogram 4. PTCA/DES x 1 mid RCA  Operator: Verne Carrow, MD  Indication:  62 yo female with history of CAD admitted with unstable angina, NSTEMI. Recurrent chest pain today. History of proximal and mid RCA stents in 2011/2012. No cath since June 2012.                                    Procedure Details: The risks, benefits, complications, treatment options, and expected outcomes were discussed with the patient. The patient and/or family concurred with the proposed plan, giving informed consent. The patient was brought to the cath lab after IV hydration was begun and oral premedication was given. The patient was further sedated with Versed and Fentanyl. The right groin was prepped and draped in the usual manner. Using the modified Seldinger access technique, a 5 French sheath was placed in the right femoral artery. Standard diagnostic catheters were used to perform selective coronary angiography. A pigtail catheter was used to perform a left ventricular angiogram. She was found to have a severe stenosis in the mid RCA. The sheath was upsized to a 6 [place] system. She was given a bolus of Angiomax and a drip was started. She had been loaded with Plavix 600 mg po x 1 earlier this am. I engaged the RCA with a JR4 guide. When the ACT was greater than 200, I passed a Cougar IC wire down the RCA. I then pre-dilated the stenosis with a 2.5 x 12 mm balloon. I then carefully positioned and placed a 3.5 x 16 mm Promus Premier DES in the mid RCA. The stent was post-dilated with a 3.75 x 12 mm [place] balloon. The stenosis was taken from 99% down to 0%. There was an excellent result.   There were no immediate complications. The patient was taken to the recovery area in stable  condition.   Hemodynamic Findings: Central aortic pressure: 82/54 Left ventricular pressure: 81/16/19  Angiographic Findings:  Left main: No obstructive disease.   Left Anterior Descending Artery:  Large caliber vessel that courses to the apex. The mid vessel has a 30% stenosis just after the diagonal branch. The diagonal branch is moderate in caliber with 40% proximal stenosis. The diagonal branch bifurcates. The inferior sub-branch of the diagonal beyond the bifurcation has a 50% stenosis.   Circumflex Artery: Large caliber vessel with moderate caliber obtuse marginal branch. Proximal vessel with 30-40% stenosis. Mid vessel with 30% stenosis.   Right Coronary Artery: Large dominant vessel with patent proximal stent with 30% restenosis in the proximal segment of the stent. The mid vessel has a focal 99% stenosis. The distal vessel has a patent stent with 40% diffuse in-stent restenosis. The PDA and posterolateral branches are patent with mild disease.   Left Ventricular Angiogram: LVEF=15-20%. Global hypokinesis.   Impression: 1. Single vessel CAD with severe stenosis mid RCA 2. Severe global LV systolic dysfunction 3. Successful PTCA/DES x 1 mid RCA  Recommendations: She is intolerant to ASA. Will continue Plavix for lifetime. Continue other cardiac meds. Aggressive risk factor reduction.        Complications:  None. The patient tolerated the procedure well.

## 2013-04-27 NOTE — Care Management Note (Unsigned)
    Page 1 of 1   04/27/2013     10:41:04 AM   CARE MANAGEMENT NOTE 04/27/2013  Patient:  Andrea [name], Andrea [name]   Account Number:  [id number]  Date Initiated:  04/27/2013  Documentation initiated by:  GRAVES-BIGELOW,Trayvion Embleton  Subjective/Objective Assessment:   Pt admitted with Nstemi. Plan for cath today.     Action/Plan:   CM will continue to monitor for disposition needs.   Anticipated DC Date:  04/29/2013   Anticipated DC Plan:  HOME/SELF CARE      DC Planning Services  CM consult      Choice offered to / List presented to:             Status of service:  In process, will continue to follow Medicare Important Message given?   (If response is "NO", the following Medicare IM given date fields will be blank) Date Medicare IM given:   Date Additional Medicare IM given:    Discharge Disposition:    Per UR Regulation:  Reviewed for med. necessity/level of care/duration of stay  If discussed at Long Length of Stay Meetings, dates discussed:    Comments:

## 2013-04-27 NOTE — Progress Notes (Signed)
ANTICOAGULATION CONSULT NOTE - Follow Up Consult  Pharmacy Consult for Heparin Indication: NSTEMI  Allergies  Allergen Reactions  . Aspirin Shortness Of Breath and Other (See Comments)    Asthma symptoms  . Brilinta (Ticagrelor) Shortness Of Breath and Other (See Comments)    Gout   . Percocet (Oxycodone-Acetaminophen) Nausea And Vomiting  . Darvon Nausea And Vomiting  . Imitrex (Sumatriptan Base) Other (See Comments)    Unknown reaction  . Nsaids Nausea And Vomiting    Patient Measurements: Height: 5' (152.4 cm) Weight: 187 lb 6.3 oz (85 kg) IBW/kg (Calculated) : 45.5 Heparin Dosing Weight: 66kg  Vital Signs: Temp: 98.1 F (36.7 C) (04/28 0753) Temp src: Oral (04/28 0753) BP: 108/70 mmHg (04/28 0959) Pulse Rate: 100 (04/28 0835)  Labs:  Recent Labs  04/26/13 0924 04/26/13 1645 04/26/13 1804 04/27/13 0011 04/27/13 0523 04/27/13 0915  HGB 11.3*  --   --   --  11.4*  --   HCT 33.4*  --   --   --  33.8*  --   PLT 141*  --   --   --  162  --   APTT 30  --   --   --   --   --   LABPROT 14.2  --   --   --   --   --   INR 1.11  --   --   --   --   --   HEPARINUNFRC  --  0.10*  --  0.23*  --  0.29*  CREATININE 0.98  --   --   --  1.00  --   TROPONINI 0.69*  --  0.62* 0.78*  --   --     Estimated Creatinine Clearance: 57.2 ml/min (by C-G formula based on Cr of 1).   Medications:  Heparin 1200 units/hr   Assessment: 61yof on heparin for NSTEMI. Heparin level (0.29) is just below goal range but is trending up - will increase heparin rate and follow-up post cath. - H/H and Plts stable - No significant bleeding reported - No problems with IV line/infusion  Goal of Therapy:  Heparin level 0.3-0.7 units/ml Monitor platelets by anticoagulation protocol: Yes   Plan:  1. Increase heparin drip to 1300 units/hr (13 ml/hr) 2. Follow-up post cath orders  Cleon Dew [phone] 04/27/2013,10:52 AM

## 2013-04-27 NOTE — Interval H&P Note (Signed)
History and Physical Interval Note:  04/27/2013 2:41 PM  Andrea [name]  has presented today for cardiac cath with the diagnosis of CAD/NSTEMI.  The various methods of treatment have been discussed with the patient and family. After consideration of risks, benefits and other options for treatment, the patient has consented to  Procedure(s): LEFT HEART CATHETERIZATION WITH CORONARY ANGIOGRAM (N/A) as a surgical intervention .  The patient's history has been reviewed, patient examined, no change in status, stable for surgery.  I have reviewed the patient's chart and labs.  Questions were answered to the patient's satisfaction.     [name]

## 2013-04-27 NOTE — Progress Notes (Signed)
Site area: right groin  Site Prior to Removal:  Level 0  Pressure Applied For 40 MINUTES    Minutes Beginning at 1830  Manual:   yes  Patient Status During Pull:  AAO X 3  Post Pull Groin Site:  Level 0  Post Pull Instructions Given:  yes  Post Pull Pulses Present:  yes  Dressing Applied:  yes  Comments:  Re bled after initial pressure hold of 30 mins, additional 10 mins hold done , no hematoma .

## 2013-04-27 NOTE — Progress Notes (Signed)
At 1945 pt c/o 9/10 chest pain. BP 137/86 HR 103. 12 lead EKG done without acute changes noted. Pt on NTG gtt and gtt increased to 3cc/hr from 1.5cc/hr. O2 at 3L [place] applied. At 1953 pt states pain is getting better and BP 122/89. At 2000 pt states she is pain free. Hurman Horn PA notified of above. No new orders. Will cont to monitor.

## 2013-04-27 NOTE — Progress Notes (Signed)
TELEMETRY: Reviewed telemetry pt in NSR: Filed Vitals:   04/26/13 2100 04/27/13 0100 04/27/13 0600 04/27/13 0753  BP: 122/81 127/89 110/72 107/71  Pulse: 100 103 93 99  Temp: 98 F (36.7 C)  97.4 F (36.3 C) 98.1 F (36.7 C)  TempSrc: Oral  Oral Oral  Resp: 18 18 18 18   Height:      Weight:   187 lb 6.3 oz (85 kg)   SpO2: 100% 100% 100% 100%    Intake/Output Summary (Last 24 hours) at 04/27/13 0758 Last data filed at 04/27/13 0500  Gross per 24 hour  Intake    120 ml  Output    350 ml  Net   -230 ml    SUBJECTIVE Feels better. Reports some recurrent chest pain and orthopnea since admission.  LABS: Basic Metabolic Panel:  Recent Labs  16/10/96 0924 04/26/13 1804 04/27/13 0523  NA 141  --  139  K 3.5  --  3.7  CL 103  --  102  CO2 29  --  28  GLUCOSE 177*  --  145*  BUN 29*  --  22  CREATININE 0.98  --  1.00  CALCIUM 10.0  --  9.4  MG  --  1.5  --    CBC:  Recent Labs  04/26/13 0924 04/27/13 0523  WBC 5.3 6.0  NEUTROABS 2.7  --   HGB 11.3* 11.4*  HCT 33.4* 33.8*  MCV 65.9* 66.0*  PLT 141* 162   Cardiac Enzymes:  Recent Labs  04/26/13 0924 04/26/13 1804 04/27/13 0011  TROPONINI 0.69* 0.62* 0.78*   BNP: 573 Hemoglobin A1C:  Recent Labs  04/26/13 1804  HGBA1C 6.7*   Fasting Lipid Panel:  Recent Labs  04/27/13 0523  CHOL 163  HDL 29*  LDLCALC 86  TRIG 045*  CHOLHDL 5.6   Thyroid Function Tests:  Recent Labs  04/26/13 1804  TSH 0.870   Anemia Panel:  Recent Labs  04/26/13 1804  VITAMINB12 432  FOLATE 10.8  FERRITIN 99  TIBC 402  IRON 92  RETICCTPCT 1.1    Radiology/Studies:  Dg Chest 2 View  04/26/2013  *RADIOLOGY REPORT*  Clinical Data: Chest pain and shortness of breath.  CHEST - 2 VIEW  Comparison: Chest x-ray 06/25/2011.  Findings: Lung volumes are normal.  No consolidative airspace disease.  No pleural effusions.  Bibasilar linear opacities are compatible with subsegmental atelectasis and/or scarring.  No  evidence of pulmonary edema.  Mild cardiomegaly is unchanged. Mediastinal contours are unremarkable.  Left-sided pacemaker / AICD with lead tip projecting over the expected location of the right ventricular apex.  Orthopedic fixation hardware in the lower cervical spine.  IMPRESSION: 1.  No radiographic evidence of acute cardiopulmonary disease. 2.  Mild cardiomegaly. 3.  Postoperative changes and support apparatus, as above.   Original Report Authenticated By: Trudie Reed, M.D.    Ecg: NSR, LVH with repolarization abnormality and QRS widening.  PHYSICAL EXAM General: Well developed, well nourished, in no acute distress. Head: Normocephalic, atraumatic, sclera non-icteric, no xanthomas, nares are without discharge. Neck: Negative for carotid bruits. JVD not elevated. Lungs: Clear bilaterally to auscultation without wheezes, rales, or rhonchi. Breathing is unlabored. Heart: RRR S1 S2 without murmurs, rubs, or gallops.  Abdomen: Soft, non-tender, non-distended with normoactive bowel sounds. No hepatomegaly. No rebound/guarding. No obvious abdominal masses. Msk:  Strength and tone appears normal for age. Extremities: No clubbing, cyanosis or edema.  Distal pedal pulses are 2+ and equal bilaterally. Neuro: Alert  and oriented X 3. Moves all extremities spontaneously. Psych:  Responds to questions appropriately with a normal affect.  ASSESSMENT AND PLAN: 1.NSTEMI. On IV Ntg and heparin. Will plan cardiac cath/PCI today. Load with Plavix 600 mg.  2. CAD s/p DES of distal RCA 2011, proximal RCA 2012. 3. Cardiomyopathy. EF 25% 4. S/p ICD prophylactic 5. IDDM 6. Htn 7. Microcytic anemia. Probably chronic disease. Iron stores are OK. 8. History of ASA and Brilinta intolerance.  Principal Problem:   NSTEMI (non-ST elevated myocardial infarction) Active Problems:   Coronary artery disease   ISCHEMIC HEART DISEASE   Hypertension   Diabetes mellitus type 2, controlled   Dilated cardiomyopathy    ICD-Medtronic    Signed, Peter [place] MD,FACC 04/27/2013 7:59 AM

## 2013-04-27 NOTE — Progress Notes (Signed)
Pt c/o 8/10 midsternal CP, states its burning like a hot pepper; pt stated she was moving around when CP developed, pt now sitting in bed; VSS, EKG obtained, no changes noted; pt states that CP starting to resolve when she's not moving; cards to be notified

## 2013-04-27 NOTE — Progress Notes (Signed)
UR Completed Chrisy Hillebrand Graves-Bigelow, RN,BSN [phone]  

## 2013-04-28 DIAGNOSIS — I428 Other cardiomyopathies: Secondary | ICD-10-CM

## 2013-04-28 DIAGNOSIS — N179 Acute kidney failure, unspecified: Secondary | ICD-10-CM | POA: Diagnosis not present

## 2013-04-28 DIAGNOSIS — I2510 Atherosclerotic heart disease of native coronary artery without angina pectoris: Secondary | ICD-10-CM

## 2013-04-28 LAB — BASIC METABOLIC PANEL
Calcium: 9.1 mg/dL (ref 8.4–10.5)
Chloride: 103 mEq/L (ref 96–112)
Creatinine, Ser: 1.39 mg/dL — ABNORMAL HIGH (ref 0.50–1.10)
GFR calc Af Amer: 46 mL/min — ABNORMAL LOW (ref >90)
Sodium: 141 mEq/L (ref 135–145)

## 2013-04-28 LAB — GLUCOSE, CAPILLARY: Glucose-Capillary: 157 mg/dL — ABNORMAL HIGH (ref 70–99)

## 2013-04-28 MED ORDER — HEART ATTACK BOUNCING BOOK
Freq: Once | Status: AC
  Administered 2013-04-28: 03:00:00
  Filled 2013-04-28: qty 1

## 2013-04-28 MED ORDER — SODIUM CHLORIDE 0.9 % IV SOLN
INTRAVENOUS | Status: DC
  Administered 2013-04-28 – 2013-04-29 (×3): via INTRAVENOUS

## 2013-04-28 MED FILL — Sodium Chloride IV Soln 0.9%: INTRAVENOUS | Qty: 50 | Status: AC

## 2013-04-28 NOTE — Progress Notes (Signed)
CARDIAC REHAB PHASE I   PRE:  Rate/Rhythm: 83 SR  BP:  Supine: 90/57  Sitting:   Standing:    SaO2:   MODE:  Ambulation: 460 ft   POST:  Rate/Rhythm: 99 SR  BP:  Supine:   Sitting: 103/55  Standing:    SaO2:  1610-9604  Pt tolerated ambulation well without c/o of cp or SOB. Vs stable. Completed MI, CHF and stent education with pt. She voices understanding. Pt agrees to Outpt. CRP in  [place], will send referral. She did c/o of feeling dizzy after walk sitting in chair, BP 88/46. Dizziness passed with in 2-3 minutes without treatment.  Melina Copa RN 04/28/2013 8:51 AM

## 2013-04-28 NOTE — Progress Notes (Signed)
Patient's SBP 79/46, pt denies dizziness, SOB, or chest pain, Physician informed of SBP, no new orders received.

## 2013-04-28 NOTE — Progress Notes (Signed)
TELEMETRY: Reviewed telemetry pt in NSR: Filed Vitals:   04/28/13 0600 04/28/13 0650 04/28/13 0700 04/28/13 0758  BP: 80/48 81/54 87/53  90/57  Pulse:    83  Temp:    97.7 F (36.5 C)  TempSrc:    Oral  Resp:      Height:      Weight:      SpO2:    94%    Intake/Output Summary (Last 24 hours) at 04/28/13 1005 Last data filed at 04/28/13 0600  Gross per 24 hour  Intake  712.5 ml  Output      2 ml  Net  710.5 ml    SUBJECTIVE Didn't sleep well. Still has some throat pain and headache. Denies chest pain, SOB, or dizzyness.  LABS: Basic Metabolic Panel:  Recent Labs  96/04/54 1804 04/27/13 0523 04/28/13 0557  NA  --  139 141  K  --  3.7 3.6  CL  --  102 103  CO2  --  28 29  GLUCOSE  --  145* 143*  BUN  --  22 27*  CREATININE  --  1.00 1.39*  CALCIUM  --  9.4 9.1  MG 1.5  --   --    CBC:  Recent Labs  04/26/13 0924 04/27/13 0523  WBC 5.3 6.0  NEUTROABS 2.7  --   HGB 11.3* 11.4*  HCT 33.4* 33.8*  MCV 65.9* 66.0*  PLT 141* 162   Cardiac Enzymes:  Recent Labs  04/26/13 0924 04/26/13 1804 04/27/13 0011  TROPONINI 0.69* 0.62* 0.78*   Hemoglobin A1C:  Recent Labs  04/26/13 1804  HGBA1C 6.7*   Fasting Lipid Panel:  Recent Labs  04/27/13 0523  CHOL 163  HDL 29*  LDLCALC 86  TRIG 098*  CHOLHDL 5.6   Thyroid Function Tests:  Recent Labs  04/26/13 1804  TSH 0.870   Anemia Panel:  Recent Labs  04/26/13 1804  VITAMINB12 432  FOLATE 10.8  FERRITIN 99  TIBC 402  IRON 92  RETICCTPCT 1.1    Radiology/Studies:  Dg Chest 2 View  04/26/2013  *RADIOLOGY REPORT*  Clinical Data: Chest pain and shortness of breath.  CHEST - 2 VIEW  Comparison: Chest x-ray 06/25/2011.  Findings: Lung volumes are normal.  No consolidative airspace disease.  No pleural effusions.  Bibasilar linear opacities are compatible with subsegmental atelectasis and/or scarring.  No evidence of pulmonary edema.  Mild cardiomegaly is unchanged. Mediastinal contours are  unremarkable.  Left-sided pacemaker / AICD with lead tip projecting over the expected location of the right ventricular apex.  Orthopedic fixation hardware in the lower cervical spine.  IMPRESSION: 1.  No radiographic evidence of acute cardiopulmonary disease. 2.  Mild cardiomegaly. 3.  Postoperative changes and support apparatus, as above.   Original Report Authenticated By: Trudie Reed, M.D.    ECG: NSR, LVH, nonspecific ST changes.  PHYSICAL EXAM General: Well developed, obese, in no acute distress. Head: Normal Neck: Negative for carotid bruits. JVD not elevated. Lungs: Clear bilaterally to auscultation without wheezes, rales, or rhonchi. Breathing is unlabored. Heart: RRR S1 S2 without murmurs, rubs, or gallops.  Abdomen: Soft, non-tender, non-distended with normoactive bowel sounds. No hepatomegaly.  Msk:  Strength and tone appears normal for age. Extremities: No clubbing, cyanosis or edema.  Distal pedal pulses are 2+ and equal bilaterally. Right groin tender to palpation without significant hematoma. Neuro: Alert and oriented X 3. Moves all extremities spontaneously. Psych:  Responds to questions appropriately with a normal affect.  ASSESSMENT AND PLAN: 1. NSTEMI. S/p DES to RCA. Intolerant to ASA and Brilinta. On Plavix.  2. Hypotension. No evidence of active bleeding. Hgb stable. Will hold ARB, coreg, and lasix. Hydrate.  3. Acute renal failure. Creatinine normal on admit. Now 1.39. Probably secondary to hypotension and dye load. Hold meds as noted above. Hydrate. Strict I/Os. Repeat BMET in am. Will need to keep in hospital today. 4.Cardiomyopathy. EF 25%. 5. S/p ICD 6. IDDM. Glucose 135.   Principal Problem:   NSTEMI (non-ST elevated myocardial infarction) Active Problems:   Coronary artery disease   ISCHEMIC HEART DISEASE   Hypertension   Diabetes mellitus type 2, controlled   Dilated cardiomyopathy   ICD-Medtronic    Signed, Jullian Previti [place] MD,FACC 04/28/2013 10:11  AM

## 2013-04-29 ENCOUNTER — Encounter (HOSPITAL_COMMUNITY): Payer: Self-pay | Admitting: Physician Assistant

## 2013-04-29 ENCOUNTER — Other Ambulatory Visit: Payer: Self-pay | Admitting: Physician Assistant

## 2013-04-29 ENCOUNTER — Telehealth: Payer: Self-pay | Admitting: Cardiology

## 2013-04-29 DIAGNOSIS — I5022 Chronic systolic (congestive) heart failure: Secondary | ICD-10-CM

## 2013-04-29 DIAGNOSIS — Z95810 Presence of automatic (implantable) cardiac defibrillator: Secondary | ICD-10-CM

## 2013-04-29 DIAGNOSIS — N179 Acute kidney failure, unspecified: Secondary | ICD-10-CM

## 2013-04-29 DIAGNOSIS — I959 Hypotension, unspecified: Secondary | ICD-10-CM

## 2013-04-29 DIAGNOSIS — I509 Heart failure, unspecified: Secondary | ICD-10-CM

## 2013-04-29 LAB — GLUCOSE, CAPILLARY
Glucose-Capillary: 151 mg/dL — ABNORMAL HIGH (ref 70–99)
Glucose-Capillary: 176 mg/dL — ABNORMAL HIGH (ref 70–99)

## 2013-04-29 LAB — BASIC METABOLIC PANEL
BUN: 27 mg/dL — ABNORMAL HIGH (ref 6–23)
CO2: 30 mEq/L (ref 19–32)
Calcium: 8.6 mg/dL (ref 8.4–10.5)
Creatinine, Ser: 1.41 mg/dL — ABNORMAL HIGH (ref 0.50–1.10)
Glucose, Bld: 175 mg/dL — ABNORMAL HIGH (ref 70–99)

## 2013-04-29 MED ORDER — NITROGLYCERIN 0.4 MG SL SUBL: 0.4000 mg | SUBLINGUAL_TABLET | SUBLINGUAL | Status: DC | PRN

## 2013-04-29 MED ORDER — CARVEDILOL 12.5 MG PO TABS: 12.5000 mg | ORAL_TABLET | Freq: Two times a day (BID) | ORAL | Status: DC

## 2013-04-29 MED ORDER — CLOPIDOGREL BISULFATE 75 MG PO TABS: 75.0000 mg | ORAL_TABLET | Freq: Every day | ORAL | Status: DC

## 2013-04-29 NOTE — Discharge Summary (Signed)
Discharge Summary   Patient ID: Andrea [name] MRN: [id number], DOB/AGE: Mar 27, 1951 62 y.o. Admit date: 04/26/2013 D/C date:     04/29/2013  Primary Cardiologist: [place]  Primary Discharge Diagnoses:  1. CAD with NSTEMI this admission -s/p DES to mid RCA 04/27/13 -intolerant to ASA/Brilinta, on Plavix indefinitely - prior history includes DES-dRCA 11/2010, DES-pRCA 04/2011 (in an area free of disease on prior cath) 2. Hypotension - ARB/Lasix on hold pending improvement in renal function, stability of BP 3. Acute renal insufficiency -  felt secondary to hypotension and contrast load, Cr 1.41 at dc - history of this in setting of hypotension 09/2012 4. Ischemic cardiomyopathy with most recent EF 15-20% by cath this admission - h/o Medtronic ICD 5. Diabetes mellitus 6. Microcytic anemia  Secondary Discharge Diagnoses:  1. HTN with more recent history of hypotension 2. 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 Dyslipidemia 4. PVCs 5. Arthritis 6. Migraine headache 7. GERD 8. Pulmonary HTN 9. Gout 10. 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 Thrombocytopenia 13. Depression  Hospital Course: [name] is a 62 y/o F with history of CAD s/p DES-dRCA in 11/2010, DES-pRCA 04/2011 (in an area free of disease on prior cath), ICM s/p ICD, IDDM, HTN, HL, obesity who presented to [facility] with complaints of chest pain. Most recent history is notable for admission 09/2012 for marked hypotension and acute renal failure, in which ARB was held with improvement. At recent follow-up appointment, she was euvolemic, asymptomatic and ARB was resumed. She [facility]/13 with complaints of substernal sharp/stabbing chest pain radiating to her upper chest, neck and jaw with associated SOB and nausea rated at a 10/10. Episodes varied from 45 to 90 minutes. Symptoms were aggravated by exertion, alleviated by NTG, and different from her prior angina. EKG in the ED showed more pronounced  anterolateral ST/T changes, with mildly elevated troponin with peak of 0.78. She was started on heparin and NTG gtt. She was noted to have microcytic anemia and thus CBC was observed; Dr. [place] felt iron stores were OK. She was loaded with Plavix (note - has history of dyspnea with Brilinta, intolerance to ASA, cannot be on Effient due to hx of CVA). She underwent cardiac cath 04/27/13 showing severe stenosis in the mid RCA with EF 15-20%. She underwent successful PTCA/DES x 1 mid RCA. She had otherwise nonobstructive disease that will be treated medically. Post-procedurally she developed hypotension with SBP 70-80s. Antihypertensives including Coreg, Lasix, ARB were held. Hgb remained stable. This morning blood pressure improved to 111/51. Her Coreg will be resumed at lower dose. The patient is to monitor BP and hold carvedilol if BP < 100 systolic. ARB and Lasix will be held with plan to resume as outpatient once renal function has recovered. Her Cr bumped slightly to 1.41 felt due to contrast load and hypotension. Dr. [place]il[place] has seen and examined her today and feels she is stable for discharge. She will have a BMET on Monday. Our scheduling system is currently being worked on, therefore the schedulers at our office will call her with her appointment including TOC 1 week f/u NSTEMI visit.   Discharge Vitals: Blood pressure 103/57, pulse 94, temperature 98 F (36.7 C), temperature source Oral, resp. rate 18, height 5' (1.524 m), weight 189 lb 2.5 oz (85.8 kg), SpO2 95.00%.  Labs: Lab Results  Component Value Date   WBC 6.0 04/27/2013   HGB 11.4* 04/27/2013   HCT 33.8* 04/27/2013   MCV 66.0*  04/27/2013   PLT 162 04/27/2013     Recent Labs Lab 04/29/13 0410  NA 142  K 3.9  CL 104  CO2 30  BUN 27*  CREATININE 1.41*  CALCIUM 8.6  GLUCOSE 175*    Recent Labs  04/26/13 0924 04/26/13 1804 04/27/13 0011  TROPONINI 0.69* 0.62* 0.78*   Lab Results  Component Value Date   CHOL 163 04/27/2013    HDL 29* 04/27/2013   LDLCALC 86 04/27/2013   TRIG 242* 04/27/2013    Diagnostic Studies/Procedures    1. Cardiac catheterization this admission, please see full report and above for summary. 2. Dg Chest 2 View 04/26/2013  *RADIOLOGY REPORT*  Clinical Data: Chest pain and shortness of breath.  CHEST - 2 VIEW  Comparison: Chest x-ray 06/25/2011.  Findings: Lung volumes are normal.  No consolidative airspace disease.  No pleural effusions.  Bibasilar linear opacities are compatible with subsegmental atelectasis and/or scarring.  No evidence of pulmonary edema.  Mild cardiomegaly is unchanged. Mediastinal contours are unremarkable.  Left-sided pacemaker / AICD with lead tip projecting over the expected location of the right ventricular apex.  Orthopedic fixation hardware in the lower cervical spine.  IMPRESSION: 1.  No radiographic evidence of acute cardiopulmonary disease. 2.  Mild cardiomegaly. 3.  Postoperative changes and support apparatus, as above.   Original Report Authenticated By: Trudie Reed, M.D.     Discharge Medications     Medication List    STOP taking these medications       furosemide 40 MG tablet  Commonly known as:  LASIX     olmesartan 20 MG tablet  Commonly known as:  BENICAR      TAKE these medications       acetaminophen 650 MG CR tablet  Commonly known as:  TYLENOL  Take 1,300 mg by mouth every 8 (eight) hours as needed for pain.     atorvastatin 40 MG tablet  Commonly known as:  LIPITOR  Take 1 tablet by mouth at bedtime.     buPROPion 150 MG 24 hr tablet  Commonly known as:  WELLBUTRIN XL  Take 150 mg by mouth daily.     carvedilol 12.5 MG tablet  Commonly known as:  COREG  Take 1 tablet (12.5 mg total) by mouth 2 (two) times daily with a meal.     clopidogrel 75 MG tablet  Commonly known as:  PLAVIX  Take 1 tablet (75 mg total) by mouth daily with breakfast.     DULoxetine 60 MG capsule  Commonly known as:  CYMBALTA  Take 60 mg by mouth daily.       fenofibrate 145 MG tablet  Commonly known as:  TRICOR  Take 1 tablet (145 mg total) by mouth daily.     JANUVIA 100 MG tablet  Generic drug:  sitaGLIPtin  Take 1 tablet by mouth Daily.     nitroGLYCERIN 0.4 MG SL tablet  Commonly known as:  NITROSTAT  Place 1 tablet (0.4 mg total) under the tongue every 5 (five) minutes as needed for chest pain (up to 3 doses). Do not take if systolic blood pressure (the top number) is less than 100.     pantoprazole 40 MG tablet  Commonly known as:  PROTONIX  Take 40 mg by mouth daily.        Disposition   The patient will be discharged in stable condition to home. Discharge Orders   Future Appointments Provider Department Dept Phone   05/04/2013 11:10 AM  [facility] Main Office [place]   05/04/2013 12:30 PM Dyann Kief, PA-C [facility] Main Office [place]) [phone]   05/20/2013 4:30 PM [facility] [employer] Main Office [place]   11/09/2013 8:15 AM Sherrie George, MD [facility] [phone]   Future Orders Complete By Expires     Amb Referral to Cardiac Rehabilitation  As directed     Diet - low sodium heart healthy  As directed     Discharge instructions  As directed     Comments:      You were mildly anemic. Please follow up with primary care doctor for further evaluation.  Monitor your blood pressure and do not take your dose of Coreg if your systolic blood pressure (top number) is less than 100.  For patients with history of heart failure, we give them these special instructions:  1. Follow a low-salt diet and watch your fluid intake. In general, you should not be taking in more than 2 liters of fluid per day. Some patients are restricted to less than 1.5 liters. This includes sources of water in foods like soup. 2. Weigh yourself on the same scale at same time of day and keep a log. 3. Call your doctor: (Anytime you feel any of  the following symptoms)  - 3-4 pound weight gain in 1-2 days or 2 pounds overnight  - Shortness of breath, with or without a dry hacking cough  - Swelling in the hands, feet or stomach  - If you have to sleep on extra pillows at night in order to breathe    Increase activity slowly  As directed     Comments:      No driving for 1 week. No lifting over 10 lbs for 2 weeks. No sexual activity for 2 weeks. Keep procedure site clean & dry. If you notice increased pain, swelling, bleeding or pus, call/return!  You may shower, but no soaking baths/hot tubs/pools for 1 week.      Follow-up Information   Follow up with Jacolyn Reedy, PA-C On 05/04/2013. (Follow up with Jacolyn Reedy, PA-C at Dr. Elvis Coil office on 05/04/13 at 12:30pm (arrive at 12:15pm) with labwork afterwards)    Contact information:   [facility] [address] [place], [address] [place] [address] [phone]         Duration of Discharge Encounter: Greater than 30 minutes including physician and PA time.  Signed, Ronie Spies PA-C 04/29/2013, 8:46 AM

## 2013-04-29 NOTE — Progress Notes (Signed)
TELEMETRY: Reviewed telemetry pt in NSR with occ PVCs: Filed Vitals:   04/29/13 0010 04/29/13 0417 04/29/13 0420 04/29/13 0500  BP: 108/67 111/51 111/51   Pulse: 94 93 93   Temp:  98 F (36.7 C)    TempSrc:  Oral    Resp:      Height:      Weight:    189 lb 2.5 oz (85.8 kg)  SpO2: 95% 95% 95%     Intake/Output Summary (Last 24 hours) at 04/29/13 0723 Last data filed at 04/29/13 4540  Gross per 24 hour  Intake 2723.33 ml  Output   1700 ml  Net 1023.33 ml    SUBJECTIVE Feels better today. No chest pain or SOB. BP improved.  LABS: Basic Metabolic Panel:  Recent Labs  98/11/91 1804  04/28/13 0557 04/29/13 0410  NA  --   < > 141 142  K  --   < > 3.6 3.9  CL  --   < > 103 104  CO2  --   < > 29 30  GLUCOSE  --   < > 143* 175*  BUN  --   < > 27* 27*  CREATININE  --   < > 1.39* 1.41*  CALCIUM  --   < > 9.1 8.6  MG 1.5  --   --   --   < > = values in this interval not displayed. CBC:  Recent Labs  04/26/13 0924 04/27/13 0523  WBC 5.3 6.0  NEUTROABS 2.7  --   HGB 11.3* 11.4*  HCT 33.4* 33.8*  MCV 65.9* 66.0*  PLT 141* 162   Cardiac Enzymes:  Recent Labs  04/26/13 0924 04/26/13 1804 04/27/13 0011  TROPONINI 0.69* 0.62* 0.78*   Hemoglobin A1C:  Recent Labs  04/26/13 1804  HGBA1C 6.7*   Fasting Lipid Panel:  Recent Labs  04/27/13 0523  CHOL 163  HDL 29*  LDLCALC 86  TRIG 478*  CHOLHDL 5.6   Thyroid Function Tests:  Recent Labs  04/26/13 1804  TSH 0.870   Anemia Panel:  Recent Labs  04/26/13 1804  VITAMINB12 432  FOLATE 10.8  FERRITIN 99  TIBC 402  IRON 92  RETICCTPCT 1.1    Radiology/Studies:  Dg Chest 2 View  04/26/2013  *RADIOLOGY REPORT*  Clinical Data: Chest pain and shortness of breath.  CHEST - 2 VIEW  Comparison: Chest x-ray 06/25/2011.  Findings: Lung volumes are normal.  No consolidative airspace disease.  No pleural effusions.  Bibasilar linear opacities are compatible with subsegmental atelectasis and/or  scarring.  No evidence of pulmonary edema.  Mild cardiomegaly is unchanged. Mediastinal contours are unremarkable.  Left-sided pacemaker / AICD with lead tip projecting over the expected location of the right ventricular apex.  Orthopedic fixation hardware in the lower cervical spine.  IMPRESSION: 1.  No radiographic evidence of acute cardiopulmonary disease. 2.  Mild cardiomegaly. 3.  Postoperative changes and support apparatus, as above.   Original Report Authenticated By: Trudie Reed, M.D.    ECG: NSR, LVH, nonspecific ST changes.  PHYSICAL EXAM General: Well developed, obese, in no acute distress. Head: Normal Neck: Negative for carotid bruits. JVD not elevated. Lungs: Clear bilaterally to auscultation without wheezes, rales, or rhonchi. Breathing is unlabored. Heart: RRR S1 S2 without murmurs, rubs, or gallops.  Abdomen: Soft, non-tender, non-distended with normoactive bowel sounds. No hepatomegaly.  Msk:  Strength and tone appears normal for age. Extremities: No clubbing, cyanosis or edema.  Distal pedal pulses are  2+ and equal bilaterally. Right groin tender to palpation without significant hematoma. Neuro: Alert and oriented X 3. Moves all extremities spontaneously. Psych:  Responds to questions appropriately with a normal affect.  ASSESSMENT AND PLAN: 1. NSTEMI. S/p DES to RCA. Intolerant to ASA and Brilinta. On Plavix indefinitely.  2. Hypotension. Resolved with hydration and holding cardiac meds. No evidence of active bleeding. Hgb stable. Will continue to hold Benicar and lasix. Reduce carvedilol to 12.5 mg bid. Patient to monitor BP and hold carvedilol if BP < 100 systolic. 3. Acute renal failure. Creatinine normal on admit. Now 1.41. Unchanged from yesterday. Probably secondary to hypotension and dye load. Hold meds as noted above. Will repeat BMET Monday. 4.Cardiomyopathy. EF 25%. 5. S/p ICD 6. IDDM. Glucose 135.   Will DC home today with above recommendations. Follow up OV  1-2 weeks. Will resume ARB and lasix once renal function recovered.  Principal Problem:   NSTEMI (non-ST elevated myocardial infarction) Active Problems:   Coronary artery disease   ISCHEMIC HEART DISEASE   Hypertension   Diabetes mellitus type 2, controlled   Dilated cardiomyopathy   ICD-Medtronic   Acute renal failure    Signed, Inis Borneman [place] MD,FACC 04/29/2013 7:23 AM

## 2013-04-29 NOTE — Telephone Encounter (Signed)
Patient contacted regarding discharge from [facility] on 04/29/2013.  Patient understands to follow up with provider Leda Gauze, PA on 5/5/20014. Patient understands discharge instructions? Yes Patient understands medications and regiment? Yes Patient understands to bring all medications to this visit? Yes

## 2013-04-29 NOTE — Telephone Encounter (Signed)
New problem    Per Jean Rosenthal.    TCM appt on  5/5 with Herma Carson.

## 2013-04-29 NOTE — Progress Notes (Signed)
CARDIAC REHAB PHASE I   PRE:  Rate/Rhythm: 98 SR  BP:  Supine: 114/79  Sitting:   Standing:    SaO2:   MODE:  Ambulation: 520 ft   POST:  Rate/Rhythm: 117 ST  BP:  Supine:   Sitting: 103/57  Standing:    SaO2:  0755-0820 Pt tolerated ambulation well without c/o of cp or SOB. HR after walk 117. Pt denies any dizziness. Pt to recliner after walk with call light in reach.  Melina Copa RN 04/29/2013 8:18 AM

## 2013-04-29 NOTE — Progress Notes (Signed)
Retro Utilization Review Completed. Temple Sporer J. Lucretia Roers, RN, BSN, [employer] [phone].

## 2013-04-30 NOTE — Discharge Summary (Signed)
Patient seen and examined and history reviewed. Agree with above findings and plan. See progress note.  Callie Facey JordanMD 04/30/2013 8:25 AM    

## 2013-05-04 ENCOUNTER — Encounter: Payer: Self-pay | Admitting: Physician Assistant

## 2013-05-04 ENCOUNTER — Ambulatory Visit (INDEPENDENT_AMBULATORY_CARE_PROVIDER_SITE_OTHER): Payer: BC Managed Care – PPO | Admitting: Physician Assistant

## 2013-05-04 ENCOUNTER — Encounter: Payer: Self-pay | Admitting: Cardiology

## 2013-05-04 ENCOUNTER — Encounter (INDEPENDENT_AMBULATORY_CARE_PROVIDER_SITE_OTHER): Payer: BC Managed Care – PPO

## 2013-05-04 ENCOUNTER — Other Ambulatory Visit (INDEPENDENT_AMBULATORY_CARE_PROVIDER_SITE_OTHER): Payer: BC Managed Care – PPO

## 2013-05-04 VITALS — BP 127/62 | HR 90 | Ht 60.0 in | Wt 193.1 lb

## 2013-05-04 DIAGNOSIS — R0989 Other specified symptoms and signs involving the circulatory and respiratory systems: Secondary | ICD-10-CM | POA: Insufficient documentation

## 2013-05-04 DIAGNOSIS — I2510 Atherosclerotic heart disease of native coronary artery without angina pectoris: Secondary | ICD-10-CM

## 2013-05-04 DIAGNOSIS — I6529 Occlusion and stenosis of unspecified carotid artery: Secondary | ICD-10-CM

## 2013-05-04 DIAGNOSIS — I10 Essential (primary) hypertension: Secondary | ICD-10-CM

## 2013-05-04 DIAGNOSIS — N179 Acute kidney failure, unspecified: Secondary | ICD-10-CM

## 2013-05-04 DIAGNOSIS — I420 Dilated cardiomyopathy: Secondary | ICD-10-CM

## 2013-05-04 DIAGNOSIS — I959 Hypotension, unspecified: Secondary | ICD-10-CM

## 2013-05-04 DIAGNOSIS — I428 Other cardiomyopathies: Secondary | ICD-10-CM

## 2013-05-04 LAB — BASIC METABOLIC PANEL
Chloride: 104 mEq/L (ref 96–112)
Creatinine, Ser: 1.3 mg/dL — ABNORMAL HIGH (ref 0.4–1.2)
Sodium: 139 mEq/L (ref 135–145)

## 2013-05-04 MED ORDER — CARVEDILOL 25 MG PO TABS: 25.0000 mg | ORAL_TABLET | Freq: Two times a day (BID) | ORAL | Status: DC

## 2013-05-04 NOTE — Assessment & Plan Note (Signed)
Hypotension has resolved. Her blood pressures have been stable home. Her heart rate is a little fast today. I will increase her Coreg to 25 mg b.i.d. Which she was taking prior to admission.

## 2013-05-04 NOTE — Assessment & Plan Note (Signed)
Check carotid Dopplers °

## 2013-05-04 NOTE — Progress Notes (Signed)
HPI:  This is a 62 year old African American female patient of Dr. Peter [place] who suffered a non-ST elevation MI on 42/27/14. Was treated with drug-eluting stent to the mid RCA on 04/27/13. She has an intolerance to aspirin and Brilinta and will remain on Plavix indefinitely. She has a prior history of a drug-eluting stent to the RCA in 2011 as well as 2012. She developed hypotension the hospital so her ARB and Lasix were on hold pending improvement in her renal function. She was sent home on a lower dose Coreg as well. She is due for her the bmet today. She also has an ischemic cardiomyopathy and ejection fraction was 15-20% on cardiac catheter this admission. She has a history of an ICD.  Since the patient at home she denies any chest pain, palpitations, dyspnea, dyspnea on exertion, dizziness, or presyncope. Her blood pressures have been stable at home in the 120 to 130 systolic. She doesn't smoke but lives in a house with a smoker.     Allergies:  -- Aspirin -- Shortness Of Breath and Other (See                           Comments)   --  Asthma symptoms  -- Brilinta (Ticagrelor) -- Shortness Of Breath and Other (See                           Comments)   --  Gout  -- Percocet (Oxycodone-Acetaminophen) -- Nausea And Vomiting  -- Darvon -- Nausea And Vomiting  -- Imitrex (Sumatriptan Base) -- Other (See Comments)   --  Unknown reaction  -- Nsaids -- Nausea And Vomiting  Current Outpatient Prescriptions on File Prior to Visit: acetaminophen (TYLENOL) 650 MG CR tablet, Take 1,300 mg by mouth every 8 (eight) hours as needed for pain., Disp: , Rfl:  atorvastatin (LIPITOR) 40 MG tablet, Take 1 tablet by mouth at bedtime., Disp: , Rfl:  buPROPion (WELLBUTRIN XL) 150 MG 24 hr tablet, Take 150 mg by mouth daily. , Disp: , Rfl:  clopidogrel (PLAVIX) 75 MG tablet, Take 1 tablet (75 mg total) by mouth daily with breakfast., Disp: 30 tablet, Rfl: 11 DULoxetine (CYMBALTA) 60 MG capsule, Take 60 mg by  mouth daily.  , Disp: , Rfl:  fenofibrate (TRICOR) 145 MG tablet, Take 1 tablet (145 mg total) by mouth daily., Disp: 30 tablet, Rfl: 5 JANUVIA 100 MG tablet, Take 1 tablet by mouth Daily., Disp: , Rfl:  nitroGLYCERIN (NITROSTAT) 0.4 MG SL tablet, Place 1 tablet (0.4 mg total) under the tongue every 5 (five) minutes as needed for chest pain (up to 3 doses). Do not take if systolic blood pressure (the top number) is less than 100., Disp: , Rfl:  pantoprazole (PROTONIX) 40 MG tablet, Take 40 mg by mouth daily., Disp: , Rfl:   No current facility-administered medications on file prior to visit.   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 DES-dRCA 11/2010. b. DES-pRCA 04/2011 (in an              area free of disease on prior cath). c. s/p               DES-mRCA 03/2013 for NSTEMI.   Ischemic cardiomyopathy                                        Comment:a. Hx of medtronic ICD. b. EF 15-20% by cath               03/2013.   Pulmonary hypertension                                       Gout                                                         Obesity  DJD (degenerative joint disease)                              Thrombocytopenia                                             Depression                                                   Hypotension                                                    Comment:a. Admission 09/2012 for hypotn & ARF. b. Meds               held 03/2013 due to hypotension.   Acute renal insufficiency                                      Comment:a. 09/2012. b. Also noted post-cath 03/2013.   Microcytic anemia                                              Comment:a. Noted 03/2013 on labs, iron studies WNL.  Past Surgical History:   CARDIAC CATHETERIZATION                                       TONSILLECTOMY                                                 OOPHORECTOMY                                                    Comment:right   MASS EXCISION                                                   Comment:left hip   CERVICAL POLYPECTOMY                                          FOOT SURGERY  US ECHOCARDIOGRAPHY                              2011        Review of patient's family history indicates:   Heart failure                  Mother                   Heart attack                   Father                   Heart disease                  Brother                  Social History   Marital Status: Divorced            Spouse Name:                      Years of Education:                 Number of children: 1           Occupational History Occupation          Associate Professor            Comment                Social History Main Topics   Smoking Status: Former Smoker                   Packs/Day: 0.00  Years: 1         Types: Cigarettes     Quit date: 04/23/1982   Smokeless Status: Never Used                       Alcohol Use: No             Drug Use: No             Sexual Activity: Not on file        Other Topics            Concern   None on file  Social History Narrative   None on file    ROS: See history of present illness  otherwise negative  PHYSICAL EXAM: Well-nournished, in no acute distress. Neck:right carotid bruit, No JVD, HJR,or thyroid enlargement  Lungs: No tachypnea, clear without wheezing, rales, or rhonchi  Cardiovascular: RRR, PMI not displaced, heart sounds normal, no murmurs, gallops, bruit, thrill, or heave.  Abdomen: BS normal. Soft without organomegaly, masses, lesions or tenderness.  Extremities: right groin without hematoma or hemorrhage at catheter site, otherwise lower extremities without cyanosis, clubbing or edema. Good distal pulses bilateral  SKin: Warm, no lesions or rashes   Musculoskeletal: No deformities  Neuro: no focal signs  BP 127/62  Pulse 90  Ht 5' (1.524 m)  Wt 193 lb 1.9 oz (87.599 kg)  BMI 37.72 kg/m2   EAV:WUJWJX sinus rhythm with LVH at 90 beats per minute nonspecific ST-T wave changes

## 2013-05-04 NOTE — Patient Instructions (Addendum)
Your physician has requested that you have a carotid duplex. This test is an ultrasound of the carotid arteries in your neck. It looks at blood flow through these arteries that supply the brain with blood. Allow one hour for this exam. There are no restrictions or special instructions.  Your physician recommends that you return for lab work in: today  Your physician has recommended you make the following change in your medication: increase Carvedilol to 25 mg twice daily  Your physician recommends that you schedule a follow-up appointment in: 2 months

## 2013-05-04 NOTE — Assessment & Plan Note (Signed)
At the EF 15-20% on cardiac catheterization 03/2013. Currently she is stable without evidence of heart failure. May need her ARB and Lasix restarted once her renal function improves.

## 2013-05-04 NOTE — Assessment & Plan Note (Signed)
Creatinine was 1.41 at discharge. We will check labs today. Her ARB and Lasix are on hold pending improvement of her renal function.

## 2013-05-04 NOTE — Assessment & Plan Note (Addendum)
Patient had non-STEMI on 04/26/13 treated with drug-eluting stent to the mid RCA on 04/27/13. Ejection fraction 15-20%. Just prior drug-eluting stent to the distal RCA in 2011 and drug-eluting stent to the proximal RCA in 2012 she should be on Plavix indefinitely. She is doing well without chest pain.

## 2013-05-05 ENCOUNTER — Encounter (HOSPITAL_COMMUNITY): Payer: Self-pay | Admitting: Emergency Medicine

## 2013-05-05 ENCOUNTER — Emergency Department (HOSPITAL_COMMUNITY): Payer: BC Managed Care – PPO

## 2013-05-05 ENCOUNTER — Inpatient Hospital Stay (HOSPITAL_COMMUNITY)
Admission: EM | Admit: 2013-05-05 | Discharge: 2013-05-08 | DRG: 127 | Disposition: A | Discharge status: Home / Self Care | Payer: BC Managed Care – PPO | Attending: Cardiology | Admitting: Cardiology

## 2013-05-05 DIAGNOSIS — E669 Obesity, unspecified: Secondary | ICD-10-CM | POA: Diagnosis present

## 2013-05-05 DIAGNOSIS — I420 Dilated cardiomyopathy: Secondary | ICD-10-CM | POA: Diagnosis present

## 2013-05-05 DIAGNOSIS — I509 Heart failure, unspecified: Secondary | ICD-10-CM

## 2013-05-05 DIAGNOSIS — Z9861 Coronary angioplasty status: Secondary | ICD-10-CM

## 2013-05-05 DIAGNOSIS — I5023 Acute on chronic systolic (congestive) heart failure: Principal | ICD-10-CM | POA: Diagnosis present

## 2013-05-05 DIAGNOSIS — M109 Gout, unspecified: Secondary | ICD-10-CM | POA: Diagnosis present

## 2013-05-05 DIAGNOSIS — N289 Disorder of kidney and ureter, unspecified: Secondary | ICD-10-CM | POA: Diagnosis not present

## 2013-05-05 DIAGNOSIS — N179 Acute kidney failure, unspecified: Secondary | ICD-10-CM

## 2013-05-05 DIAGNOSIS — Z87891 Personal history of nicotine dependence: Secondary | ICD-10-CM

## 2013-05-05 DIAGNOSIS — M1990 Unspecified osteoarthritis, unspecified site: Secondary | ICD-10-CM | POA: Diagnosis present

## 2013-05-05 DIAGNOSIS — I2789 Other specified pulmonary heart diseases: Secondary | ICD-10-CM | POA: Diagnosis present

## 2013-05-05 DIAGNOSIS — I951 Orthostatic hypotension: Secondary | ICD-10-CM | POA: Diagnosis not present

## 2013-05-05 DIAGNOSIS — Z79899 Other long term (current) drug therapy: Secondary | ICD-10-CM

## 2013-05-05 DIAGNOSIS — E785 Hyperlipidemia, unspecified: Secondary | ICD-10-CM | POA: Diagnosis present

## 2013-05-05 DIAGNOSIS — I428 Other cardiomyopathies: Secondary | ICD-10-CM | POA: Diagnosis present

## 2013-05-05 DIAGNOSIS — F329 Major depressive disorder, single episode, unspecified: Secondary | ICD-10-CM | POA: Diagnosis present

## 2013-05-05 DIAGNOSIS — F3289 Other specified depressive episodes: Secondary | ICD-10-CM | POA: Diagnosis present

## 2013-05-05 DIAGNOSIS — Z8673 Personal history of transient ischemic attack (TIA), and cerebral infarction without residual deficits: Secondary | ICD-10-CM

## 2013-05-05 DIAGNOSIS — E119 Type 2 diabetes mellitus without complications: Secondary | ICD-10-CM | POA: Diagnosis present

## 2013-05-05 DIAGNOSIS — Z95810 Presence of automatic (implantable) cardiac defibrillator: Secondary | ICD-10-CM | POA: Diagnosis present

## 2013-05-05 DIAGNOSIS — I2510 Atherosclerotic heart disease of native coronary artery without angina pectoris: Secondary | ICD-10-CM | POA: Diagnosis present

## 2013-05-05 DIAGNOSIS — Z7902 Long term (current) use of antithrombotics/antiplatelets: Secondary | ICD-10-CM

## 2013-05-05 DIAGNOSIS — R0602 Shortness of breath: Secondary | ICD-10-CM

## 2013-05-05 DIAGNOSIS — I259 Chronic ischemic heart disease, unspecified: Secondary | ICD-10-CM

## 2013-05-05 DIAGNOSIS — I10 Essential (primary) hypertension: Secondary | ICD-10-CM | POA: Diagnosis present

## 2013-05-05 DIAGNOSIS — K219 Gastro-esophageal reflux disease without esophagitis: Secondary | ICD-10-CM | POA: Diagnosis present

## 2013-05-05 DIAGNOSIS — I252 Old myocardial infarction: Secondary | ICD-10-CM

## 2013-05-05 HISTORY — DX: Unspecified asthma, uncomplicated: J45.909

## 2013-05-05 HISTORY — DX: Type 2 diabetes mellitus without complications: E11.9

## 2013-05-05 HISTORY — DX: Anxiety disorder, unspecified: F41.9

## 2013-05-05 HISTORY — DX: Non-ST elevation (NSTEMI) myocardial infarction: I21.4

## 2013-05-05 HISTORY — DX: Angina pectoris, unspecified: I20.9

## 2013-05-05 HISTORY — DX: Heart failure, unspecified: I50.9

## 2013-05-05 HISTORY — DX: Pneumonia, unspecified organism: J18.9

## 2013-05-05 LAB — POCT I-STAT TROPONIN I: Troponin i, poc: 0 ng/mL (ref 0.00–0.08)

## 2013-05-05 LAB — BASIC METABOLIC PANEL
CO2: 25 mEq/L (ref 19–32)
Calcium: 10.1 mg/dL (ref 8.4–10.5)
Chloride: 101 mEq/L (ref 96–112)
Creatinine, Ser: 1.02 mg/dL (ref 0.50–1.10)
Glucose, Bld: 122 mg/dL — ABNORMAL HIGH (ref 70–99)
Sodium: 140 mEq/L (ref 135–145)

## 2013-05-05 LAB — PROTIME-INR: Prothrombin Time: 13.8 seconds (ref 11.6–15.2)

## 2013-05-05 LAB — CBC
Hemoglobin: 12 g/dL (ref 12.0–15.0)
MCV: 65 fL — ABNORMAL LOW (ref 78.0–100.0)
Platelets: 210 10*3/uL (ref 150–400)
RBC: 5.23 MIL/uL — ABNORMAL HIGH (ref 3.87–5.11)
WBC: 7.4 10*3/uL (ref 4.0–10.5)

## 2013-05-05 MED ORDER — FUROSEMIDE 10 MG/ML IJ SOLN
40.0000 mg | Freq: Once | INTRAMUSCULAR | Status: AC
  Administered 2013-05-06: 40 mg via INTRAVENOUS
  Filled 2013-05-05: qty 4

## 2013-05-05 MED ORDER — ALBUTEROL SULFATE (5 MG/ML) 0.5% IN NEBU
5.0000 mg | INHALATION_SOLUTION | Freq: Once | RESPIRATORY_TRACT | Status: AC
  Administered 2013-05-05: 5 mg via RESPIRATORY_TRACT
  Filled 2013-05-05: qty 1

## 2013-05-05 NOTE — H&P (Signed)
Cardiology H&P  Primary Care Povider: Lolita Patella, MD Primary Cardiologist: P [place]   HPI: Andrea [name] is a 62 y.o.female with CAD and dilated cardiomyopathy out of proportion to CAD who was recently admitted with NSTEMI and underwent PCI to her RCA at the end of April 2014.  Due to low BP's and renal insufficiency her lasix and ARB were held at discharge and her coreg dose was cut in a half.  She was seen in followup in cardiology clinic just yesterday and her coreg was increased to 25mg  BID.  This morning she reports waking up and feeling fatigued.  She did some things around the house but then went back to bed.  When she awakend, she developed sudden SOB and mild chest discomfort.  The CP was much milder than it had been with her recent MI.  Her SOB was significant which prompted her to come to the ED.  On arrival her BP was 160/90 and her HR was 110.  She improved with a nebulizer and IV lasix.  She is now resting comfortably and her BP has improved.   Past Medical History  Diagnosis Date  . Chronic systolic heart failure   . AUTOMATIC IMPLANTABLE CARDIAC DEFIBRILLATOR SITU   . Hypertension   . Stroke 02/2010    left brain CVA? recurrent TIA's treated with TPA  . Diabetes mellitus   . Dyslipidemia   . Premature ventricular contractions (PVCs) (VPCs)   . Arthritis   . Migraine headache   . GERD (gastroesophageal reflux disease)   . Coronary artery disease     a. DES-dRCA 11/2010. b. DES-pRCA 04/2011 (in an area free of disease on prior cath). c. s/p DES-mRCA 03/2013 for NSTEMI.  . Ischemic cardiomyopathy     a. Hx of medtronic ICD. b. EF 15-20% by cath 03/2013.  . Pulmonary hypertension   . Gout   . Obesity   . DJD (degenerative joint disease)   . Thrombocytopenia   . Depression   . Hypotension     a. Admission 09/2012 for hypotn & ARF. b. Meds held 03/2013 due to hypotension.  . Acute renal insufficiency     a. 09/2012. b. Also noted post-cath 03/2013.  . Microcytic  anemia     a. Noted 03/2013 on labs, iron studies WNL.    Past Surgical History  Procedure Laterality Date  . Cardiac catheterization    . Tonsillectomy    . Oophorectomy      right  . Mass excision      left hip  . Cervical polypectomy    . Foot surgery    . US echocardiography  2011    Family History  Problem Relation Age of Onset  . Heart failure Mother   . Heart attack Father   . Heart disease Brother     Social History:  reports that she quit smoking about 31 years ago. Her smoking use included Cigarettes. She smoked 0.00 packs per day for 1 year. She has never used smokeless tobacco. She reports that she does not drink alcohol or use illicit drugs.  Allergies:  Allergies  Allergen Reactions  . Aspirin Shortness Of Breath and Other (See Comments)    Asthma symptoms  . Brilinta (Ticagrelor) Shortness Of Breath and Other (See Comments)    Gout   . Percocet (Oxycodone-Acetaminophen) Nausea And Vomiting  . Darvon Nausea And Vomiting  . Imitrex (Sumatriptan Base) Other (See Comments)    Unknown reaction  . Nsaids Nausea And Vomiting  Current Facility-Administered Medications  Medication Dose Route Frequency Provider Last Rate Last Dose  . furosemide (LASIX) injection 40 mg  40 mg Intravenous Once Oleh Genin, MD       Current Outpatient Prescriptions  Medication Sig Dispense Refill  . acetaminophen (TYLENOL) 650 MG CR tablet Take 1,300 mg by mouth every 8 (eight) hours as needed for pain.      Marland Kitchen atorvastatin (LIPITOR) 40 MG tablet Take 1 tablet by mouth at bedtime.      Marland Kitchen buPROPion (WELLBUTRIN XL) 150 MG 24 hr tablet Take 150 mg by mouth daily.       . carvedilol (COREG) 25 MG tablet Take 1 tablet (25 mg total) by mouth 2 (two) times daily with a meal.  60 tablet  3  . clopidogrel (PLAVIX) 75 MG tablet Take 1 tablet (75 mg total) by mouth daily with breakfast.  30 tablet  11  . DULoxetine (CYMBALTA) 60 MG capsule Take 60 mg by mouth daily.        . fenofibrate  (TRICOR) 145 MG tablet Take 1 tablet (145 mg total) by mouth daily.  30 tablet  5  . JANUVIA 100 MG tablet Take 100 mg by mouth Daily.       . pantoprazole (PROTONIX) 40 MG tablet Take 40 mg by mouth daily.        ROS: A full review of systems is obtained and is negative except as noted in the HPI.  Physical Exam: Blood pressure 140/73, pulse 107, temperature 98.7 F (37.1 C), temperature source Oral, resp. rate 23, height 5' (1.524 m), weight 88.451 kg (195 lb), SpO2 98.00%.  GENERAL: no acute distress.  EYES: Extra ocular movements are intact. There is no lid lag. Sclera is anicteric.  ENT: Oropharynx is clear. Dentition is within normal limits.  NECK: Supple. The thyroid is not enlarged.  LYMPH: There are no masses or lymphadenopathy present.  HEART: Regular rate and rhythm with no m/g/r.  Normal S1/S2. Mild JVD LUNGS: Clear to auscultation There are no rales, rhonchi, or wheezes.  ABDOMEN: Soft, non-tender, and non-distended with normoactive bowel sounds. There is no hepatosplenomegaly.  EXTREMITIES: No clubbing, cyanosis, or edema.  PULSES: Carotids were +2 and equal bilaterally with no bruits. Femoral pulses were +2 and equal bilaterally. DP/PT pulses were +2 and equal bilaterally.  SKIN: Warm, dry, and intact.  NEUROLOGIC: The patient was oriented to person, place, and time. No overt neurologic deficits were detected.  PSYCH: Normal judgment and insight, mood is appropriate.   Results: Results for orders placed during the hospital encounter of 05/05/13 (from the past 24 hour(s))  CBC     Status: Abnormal   Collection Time    05/05/13  7:03 PM      Result Value Range   WBC 7.4  4.0 - 10.5 K/uL   RBC 5.23 (*) 3.87 - 5.11 MIL/uL   Hemoglobin 12.0  12.0 - 15.0 g/dL   HCT 09.8 (*) 11.9 - 14.7 %   MCV 65.0 (*) 78.0 - 100.0 fL   MCH 22.9 (*) 26.0 - 34.0 pg   MCHC 35.3  30.0 - 36.0 g/dL   RDW 82.9 (*) 56.2 - 13.0 %   Platelets 210  150 - 400 K/uL  BASIC METABOLIC PANEL      Status: Abnormal   Collection Time    05/05/13  7:03 PM      Result Value Range   Sodium 140  135 - 145 mEq/L   Potassium 3.8  3.5 - 5.1 mEq/L   Chloride 101  96 - 112 mEq/L   CO2 25  19 - 32 mEq/L   Glucose, Bld 122 (*) 70 - 99 mg/dL   BUN 18  6 - 23 mg/dL   Creatinine, Ser 6.64  0.50 - 1.10 mg/dL   Calcium 40.3  8.4 - 47.4 mg/dL   GFR calc non Af Amer 58 (*) >90 mL/min   GFR calc Af Amer 67 (*) >90 mL/min  PRO B NATRIURETIC PEPTIDE     Status: Abnormal   Collection Time    05/05/13  7:03 PM      Result Value Range   Pro B Natriuretic peptide (BNP) 1527.0 (*) 0 - 125 pg/mL  PROTIME-INR     Status: None   Collection Time    05/05/13  7:03 PM      Result Value Range   Prothrombin Time 13.8  11.6 - 15.2 seconds   INR 1.07  0.00 - 1.49  POCT I-STAT TROPONIN I     Status: None   Collection Time    05/05/13  7:28 PM      Result Value Range   Troponin i, poc 0.00  0.00 - 0.08 ng/mL   Comment 3             EKG: NSR with LVH and nonspecific TWA CXR: clear EF 15% by LV gram but most recent echo 09/2012: EF 20-25%, global HK, mod-severe MR  Assessment/Plan: 1. SOB/nonischemic cardiomyopathy: suspect acute episode related to HTN.  Her symptoms have now improved - admit for observation - monitor BP overnight - restart lasix 40mg  oral daily (received 40 IV in ED tonight - continue coreg 25 BID - restart benicar 20mg  in AM - r/o for AMI with serial enzymes 2. CAD: recent PCI to RCA as above - continue plavix, intolerant of ASA - continue statin - trend enzymes as above 3. DM:  - SSI, januvia 4. GERD: - continue protonix 5. DVT ppx: lovenox 40mg  daily  Travis Mastel 05/05/2013, 10:21 PM

## 2013-05-05 NOTE — ED Notes (Signed)
IV access attempted by two RNs with no success.  IV team paged.

## 2013-05-05 NOTE — ED Provider Notes (Signed)
Patient seen/examined in the Emergency Department in conjunction with Resident Physician Provider Beverely Pace Patient reports shortness of breath Exam : awake/alert, but she has wheezing bilaterally Plan: likely admit as has h/o CHF with wheeze with suspect exacerbation   Joya Gaskins, MD 05/05/13 2122

## 2013-05-05 NOTE — ED Provider Notes (Signed)
History     CSN: [id number]  Arrival date & time 05/05/13  1850   First MD Initiated Contact with Patient 05/05/13 1934      Chief Complaint  Patient presents with  . Shortness of Breath    (Consider location/radiation/quality/duration/timing/severity/associated sxs/prior treatment) Patient is a 62 y.o. female presenting with shortness of breath. The history is provided by the patient.  Shortness of Breath Severity:  Moderate Onset quality:  Gradual Timing:  Constant Progression:  Worsening Chronicity:  New Context comment:  Lying flat Relieved by:  Sitting up Associated symptoms: cough, neck pain ("burning sensation in my neck") and wheezing   Associated symptoms: no abdominal pain, no chest pain, no diaphoresis, no fever, no headaches and no vomiting     Past Medical History  Diagnosis Date  . Chronic systolic heart failure   . AUTOMATIC IMPLANTABLE CARDIAC DEFIBRILLATOR SITU   . Hypertension   . Stroke 02/2010    left brain CVA? recurrent TIA's treated with TPA  . Diabetes mellitus   . Dyslipidemia   . Premature ventricular contractions (PVCs) (VPCs)   . Arthritis   . Migraine headache   . GERD (gastroesophageal reflux disease)   . Coronary artery disease     a. DES-dRCA 11/2010. b. DES-pRCA 04/2011 (in an area free of disease on prior cath). c. s/p DES-mRCA 03/2013 for NSTEMI.  . Ischemic cardiomyopathy     a. Hx of medtronic ICD. b. EF 15-20% by cath 03/2013.  . Pulmonary hypertension   . Gout   . Obesity   . DJD (degenerative joint disease)   . Thrombocytopenia   . Depression   . Hypotension     a. Admission 09/2012 for hypotn & ARF. b. Meds held 03/2013 due to hypotension.  . Acute renal insufficiency     a. 09/2012. b. Also noted post-cath 03/2013.  . Microcytic anemia     a. Noted 03/2013 on labs, iron studies WNL.    Past Surgical History  Procedure Laterality Date  . Cardiac catheterization    . Tonsillectomy    . Oophorectomy      right  . Mass  excision      left hip  . Cervical polypectomy    . Foot surgery    . US echocardiography  2011    Family History  Problem Relation Age of Onset  . Heart failure Mother   . Heart attack Father   . Heart disease Brother     History  Substance Use Topics  . Smoking status: Former Smoker -- 1 years    Types: Cigarettes    Quit date: 04/23/1982  . Smokeless tobacco: Never Used  . Alcohol Use: No    OB History   Grav Para Term Preterm Abortions TAB SAB Ect Mult Living                  Review of Systems  Constitutional: Negative for fever and diaphoresis.  HENT: Positive for neck pain ("burning sensation in my neck").   Respiratory: Positive for cough, shortness of breath and wheezing.   Cardiovascular: Negative for chest pain.  Gastrointestinal: Negative for vomiting and abdominal pain.  Neurological: Negative for headaches.  All other systems reviewed and are negative.    Allergies  Aspirin; Brilinta; Percocet; Darvon; Imitrex; and Nsaids  Home Medications   Current Outpatient Rx  Name  Route  Sig  Dispense  Refill  . acetaminophen (TYLENOL) 650 MG CR tablet   Oral   Take  1,300 mg by mouth every 8 (eight) hours as needed for pain.         Marland Kitchen atorvastatin (LIPITOR) 40 MG tablet   Oral   Take 1 tablet by mouth at bedtime.         Marland Kitchen buPROPion (WELLBUTRIN XL) 150 MG 24 hr tablet   Oral   Take 150 mg by mouth daily.          . carvedilol (COREG) 25 MG tablet   Oral   Take 1 tablet (25 mg total) by mouth 2 (two) times daily with a meal.   60 tablet   3     Dose increase   . clopidogrel (PLAVIX) 75 MG tablet   Oral   Take 1 tablet (75 mg total) by mouth daily with breakfast.   30 tablet   11   . DULoxetine (CYMBALTA) 60 MG capsule   Oral   Take 60 mg by mouth daily.           . fenofibrate (TRICOR) 145 MG tablet   Oral   Take 1 tablet (145 mg total) by mouth daily.   30 tablet   5     90 day supply is acceptable   . JANUVIA 100 MG  tablet   Oral   Take 1 tablet by mouth Daily.         . nitroGLYCERIN (NITROSTAT) 0.4 MG SL tablet   Sublingual   Place 1 tablet (0.4 mg total) under the tongue every 5 (five) minutes as needed for chest pain (up to 3 doses). Do not take if systolic blood pressure (the top number) is less than 100.         . pantoprazole (PROTONIX) 40 MG tablet   Oral   Take 40 mg by mouth daily.           BP 158/90  Pulse 109  Temp(Src) 98.7 F (37.1 C) (Oral)  Resp 22  Ht 5' (1.524 m)  Wt 195 lb (88.451 kg)  BMI 38.08 kg/m2  SpO2 96%  Physical Exam  Vitals reviewed. Constitutional: She is oriented to person, place, and time. She appears well-developed and well-nourished. No distress.  HENT:  Right Ear: External ear normal.  Left Ear: External ear normal.  Mouth/Throat: No oropharyngeal exudate.  Eyes: Conjunctivae and EOM are normal. Pupils are equal, round, and reactive to light.  Neck: Normal range of motion. Neck supple.  Cardiovascular: Regular rhythm, normal heart sounds and intact distal pulses.  Tachycardia present.  Exam reveals no gallop and no friction rub.   No murmur heard. Pulmonary/Chest: Effort normal. She has wheezes (bilaterally, scant).  Abdominal: Soft. Bowel sounds are normal. She exhibits no distension. There is no tenderness.  Musculoskeletal: Normal range of motion. She exhibits edema (trace).  Neurological: She is alert and oriented to person, place, and time. No cranial nerve deficit.  Skin: Skin is warm and dry. No rash noted. She is not diaphoretic.  Psychiatric: She has a normal mood and affect.    ED Course  Procedures (including critical care time)  Labs Reviewed  CBC - Abnormal; Notable for the following:    RBC 5.23 (*)    HCT 34.0 (*)    MCV 65.0 (*)    MCH 22.9 (*)    RDW 16.8 (*)    All other components within normal limits  BASIC METABOLIC PANEL - Abnormal; Notable for the following:    Glucose, Bld 122 (*)    GFR calc  non Af Amer 58  (*)    GFR calc Af Amer 67 (*)    All other components within normal limits  PRO B NATRIURETIC PEPTIDE - Abnormal; Notable for the following:    Pro B Natriuretic peptide (BNP) 1527.0 (*)    All other components within normal limits  POCT I-STAT TROPONIN I   Dg Chest 2 View  05/05/2013  *RADIOLOGY REPORT*  Clinical Data: Chest pain, shortness of breath  CHEST - 2 VIEW  Comparison: 04/26/2013  Findings: Cervical fixation hardware.  Stable left subclavian AICD. Heart size upper limits normal.  Lungs are clear.  No effusion. Mild thoracic kyphosis.  Atheromatous aorta.  IMPRESSION:  1.  Stable cardiomegaly and postop changes.  No acute disease.   Original Report Authenticated By: D. Andria Rhein, MD     Date: 05/05/2013  Rate: 110  Rhythm: sinus tachycardia  QRS Axis: normal  Intervals: QT prolonged  ST/T Wave abnormalities: nonspecific ST/T changes in lateral leads that have been present before  Conduction Disutrbances:none  Narrative Interpretation: Sinust tach, no significant change from prior EKG 4/29 other than a rate increase from 72 to 110  Old EKG Reviewed: unchanged    1. CHF (congestive heart failure) exacerbation  2. Chronic ischemic heart disease, unspecified   3. Coronary artery disease   4. Diabetes mellitus type 2, controlled   5. Hypertension       MDM     50 y F with PMH of systolic HF, AICD, CVA, DM, HLD, GERD, CAD s/p recent NSTEMI with DES to mid RCA 4/28 (intolerant to ASA/Brilinta, on Plavix indefinitely, prior history includes DES-dRCA 11/2010, DES-pRCA 04/2011), ischemic CM (most recent EF 15-20% by cath on recent admission, medtronic ICD), CKD (Cr 1.41 at recent discharge) here with coughing, SOB with lying flat and "burning in my neck" that all have been present and worsening over the past few days.  The burning sensation is similar to the discomfort she had during her NSTEMI recently.  She was not being particularly active at onset of sx.  No CP, fevers,  chills, abd pain, dysuria, HA or other complaints.  Of note, her Lasix was held after her last admission.  AFVSS, NAD.  Lungs with slight wheezing bilaterally.  Abd soft.  Trace edema.  Slight JVD  Diff Dx: ACS (doubt as no CP, EKG unchanged, trop negative), CHF, asthma exac, PE (doubt, no hx of prior DVT/PE, on plavix, no CP), PNA, GERD  CBC, BMP, trop, bnp, CXR.  EKG unchanged from priors.  9:30 PM Cardiology consulted for admission for Acute on chronic CHF and ACS r/o.  bnp >1500, priors no higher than 500s.  Lasix 40 mg and Albuterol neb ordered.  Disposition: Admit  Condition: Stable  Pt seen in conjunction with my attending, Dr. Bebe Shaggy.  Oleh Genin, MD PGY-II [facility] Emergency Medicine Resident     Oleh Genin, MD 05/06/13 1152  Oleh Genin, MD 05/07/13 [phone]

## 2013-05-05 NOTE — ED Notes (Signed)
Labs were drawn by Doreene Burke, EMT

## 2013-05-05 NOTE — ED Notes (Signed)
Pt c/o SOB and cough after having stent placed last week; pt denies CP; pt appears pale

## 2013-05-06 DIAGNOSIS — I10 Essential (primary) hypertension: Secondary | ICD-10-CM

## 2013-05-06 DIAGNOSIS — I259 Chronic ischemic heart disease, unspecified: Secondary | ICD-10-CM

## 2013-05-06 DIAGNOSIS — I2510 Atherosclerotic heart disease of native coronary artery without angina pectoris: Secondary | ICD-10-CM

## 2013-05-06 DIAGNOSIS — E119 Type 2 diabetes mellitus without complications: Secondary | ICD-10-CM

## 2013-05-06 DIAGNOSIS — I509 Heart failure, unspecified: Secondary | ICD-10-CM

## 2013-05-06 LAB — TROPONIN I
Troponin I: <0.3 ng/mL (ref <0.30)
Troponin I: <0.3 ng/mL (ref <0.30)
Troponin I: <0.3 ng/mL (ref <0.30)

## 2013-05-06 LAB — BASIC METABOLIC PANEL
CO2: 29 mEq/L (ref 19–32)
Chloride: 97 mEq/L (ref 96–112)
Creatinine, Ser: 1.25 mg/dL — ABNORMAL HIGH (ref 0.50–1.10)
GFR calc Af Amer: 53 mL/min — ABNORMAL LOW (ref >90)
Potassium: 3.9 mEq/L (ref 3.5–5.1)

## 2013-05-06 LAB — GLUCOSE, CAPILLARY
Glucose-Capillary: 139 mg/dL — ABNORMAL HIGH (ref 70–99)
Glucose-Capillary: 150 mg/dL — ABNORMAL HIGH (ref 70–99)
Glucose-Capillary: 112 mg/dL — ABNORMAL HIGH (ref 70–99)

## 2013-05-06 MED ORDER — SODIUM CHLORIDE 0.9 % IJ SOLN
3.0000 mL | Freq: Two times a day (BID) | INTRAMUSCULAR | Status: DC
  Administered 2013-05-06 – 2013-05-08 (×6): 3 mL via INTRAVENOUS

## 2013-05-06 MED ORDER — SODIUM CHLORIDE 0.9 % IV SOLN: 250.0000 mL | INTRAVENOUS | Status: DC | PRN

## 2013-05-06 MED ORDER — BENZONATATE 100 MG PO CAPS
100.0000 mg | ORAL_CAPSULE | Freq: Four times a day (QID) | ORAL | Status: DC | PRN
  Administered 2013-05-06 – 2013-05-07 (×3): 100 mg via ORAL
  Filled 2013-05-06 (×3): qty 1

## 2013-05-06 MED ORDER — ACETAMINOPHEN 325 MG PO TABS
650.0000 mg | ORAL_TABLET | ORAL | Status: DC | PRN
  Administered 2013-05-06 – 2013-05-08 (×3): 650 mg via ORAL
  Filled 2013-05-06 (×3): qty 2

## 2013-05-06 MED ORDER — FUROSEMIDE 10 MG/ML IJ SOLN
40.0000 mg | Freq: Once | INTRAMUSCULAR | Status: AC
  Administered 2013-05-06: 40 mg via INTRAVENOUS
  Filled 2013-05-06: qty 4

## 2013-05-06 MED ORDER — SODIUM CHLORIDE 0.9 % IJ SOLN
3.0000 mL | INTRAMUSCULAR | Status: DC | PRN
  Administered 2013-05-06: 3 mL via INTRAVENOUS

## 2013-05-06 MED ORDER — CARVEDILOL 25 MG PO TABS
25.0000 mg | ORAL_TABLET | Freq: Two times a day (BID) | ORAL | Status: DC
  Administered 2013-05-06 (×2): 25 mg via ORAL
  Filled 2013-05-06 (×5): qty 1

## 2013-05-06 MED ORDER — BUPROPION HCL ER (XL) 150 MG PO TB24
150.0000 mg | ORAL_TABLET | Freq: Every day | ORAL | Status: DC
  Administered 2013-05-06 – 2013-05-08 (×3): 150 mg via ORAL
  Filled 2013-05-06 (×3): qty 1

## 2013-05-06 MED ORDER — FENOFIBRATE 160 MG PO TABS
160.0000 mg | ORAL_TABLET | Freq: Every day | ORAL | Status: DC
  Administered 2013-05-06 – 2013-05-08 (×3): 160 mg via ORAL
  Filled 2013-05-06 (×3): qty 1

## 2013-05-06 MED ORDER — ATORVASTATIN CALCIUM 40 MG PO TABS
40.0000 mg | ORAL_TABLET | Freq: Every day | ORAL | Status: DC
  Administered 2013-05-06 – 2013-05-07 (×3): 40 mg via ORAL
  Filled 2013-05-06 (×4): qty 1

## 2013-05-06 MED ORDER — GUAIFENESIN-DM 100-10 MG/5ML PO SYRP
10.0000 mL | ORAL_SOLUTION | ORAL | Status: DC | PRN
Start: 2013-05-06 — End: 2013-05-08
  Administered 2013-05-06 – 2013-05-08 (×5): 10 mL via ORAL
  Filled 2013-05-06 (×5): qty 10

## 2013-05-06 MED ORDER — ONDANSETRON HCL 4 MG/2ML IJ SOLN
4.0000 mg | Freq: Four times a day (QID) | INTRAMUSCULAR | Status: DC | PRN
  Administered 2013-05-06 – 2013-05-08 (×2): 4 mg via INTRAVENOUS
  Filled 2013-05-06 (×2): qty 2

## 2013-05-06 MED ORDER — FUROSEMIDE 40 MG PO TABS
40.0000 mg | ORAL_TABLET | Freq: Every day | ORAL | Status: DC
  Filled 2013-05-06: qty 1

## 2013-05-06 MED ORDER — IPRATROPIUM BROMIDE HFA 17 MCG/ACT IN AERS
2.0000 | INHALATION_SPRAY | Freq: Four times a day (QID) | RESPIRATORY_TRACT | Status: DC | PRN
Start: 2013-05-06 — End: 2013-05-08
  Administered 2013-05-06: 2 via RESPIRATORY_TRACT
  Filled 2013-05-06: qty 12.9

## 2013-05-06 MED ORDER — IRBESARTAN 150 MG PO TABS
150.0000 mg | ORAL_TABLET | Freq: Every day | ORAL | Status: DC
  Administered 2013-05-06: 150 mg via ORAL
  Filled 2013-05-06 (×2): qty 1

## 2013-05-06 MED ORDER — CLOPIDOGREL BISULFATE 75 MG PO TABS
75.0000 mg | ORAL_TABLET | Freq: Every day | ORAL | Status: DC
  Administered 2013-05-06 – 2013-05-08 (×3): 75 mg via ORAL
  Filled 2013-05-06 (×4): qty 1

## 2013-05-06 MED ORDER — ENOXAPARIN SODIUM 40 MG/0.4ML ~~LOC~~ SOLN
40.0000 mg | SUBCUTANEOUS | Status: DC
  Administered 2013-05-06 – 2013-05-07 (×2): 40 mg via SUBCUTANEOUS
  Filled 2013-05-06 (×2): qty 0.4

## 2013-05-06 MED ORDER — PANTOPRAZOLE SODIUM 40 MG PO TBEC
40.0000 mg | DELAYED_RELEASE_TABLET | Freq: Every day | ORAL | Status: DC
  Administered 2013-05-06 – 2013-05-08 (×3): 40 mg via ORAL
  Filled 2013-05-06 (×3): qty 1

## 2013-05-06 MED ORDER — DULOXETINE HCL 60 MG PO CPEP
60.0000 mg | ORAL_CAPSULE | Freq: Every day | ORAL | Status: DC
  Administered 2013-05-06 – 2013-05-08 (×3): 60 mg via ORAL
  Filled 2013-05-06 (×3): qty 1

## 2013-05-06 MED ORDER — INSULIN ASPART 100 UNIT/ML ~~LOC~~ SOLN
0.0000 [IU] | Freq: Three times a day (TID) | SUBCUTANEOUS | Status: DC
  Administered 2013-05-06: 2 [IU] via SUBCUTANEOUS
  Administered 2013-05-06: 1 [IU] via SUBCUTANEOUS
  Administered 2013-05-06 – 2013-05-07 (×2): 2 [IU] via SUBCUTANEOUS
  Administered 2013-05-07: 1 [IU] via SUBCUTANEOUS
  Administered 2013-05-08: 2 [IU] via SUBCUTANEOUS

## 2013-05-06 MED ORDER — LINAGLIPTIN 5 MG PO TABS
5.0000 mg | ORAL_TABLET | Freq: Every day | ORAL | Status: DC
  Administered 2013-05-06 – 2013-05-08 (×3): 5 mg via ORAL
  Filled 2013-05-06 (×3): qty 1

## 2013-05-06 NOTE — Progress Notes (Signed)
Patient arrived to unit around 0000. She arrived by stretcher with no oxygen. She alert and oriented x 4 and was able to ambulate from stretcher to bed. IV team did insert IV once patient arrived to unit, ED nurse attempted multiple times but was unsuccessful. Doctor on call Dr. Charm Barges called around 0200 because patient's HR would increase to 140's nonsustained with activity. Patient was also complaining of shortness of breath and cough. Oxygen saturations were 97% on RA. New orders for prn breathing medication and medication for cough given.

## 2013-05-06 NOTE — Plan of Care (Signed)
Problem: Phase I Progression Outcomes Goal: Up in chair, BRP Outcome: Completed/Met Date Met:  05/06/13 Pt oob ad lib Goal: Voiding-avoid urinary catheter unless indicated Outcome: Completed/Met Date Met:  05/06/13 Pt voiding adequate amts of urine no need for foley

## 2013-05-06 NOTE — Progress Notes (Signed)
SUBJECTIVE: Breathing better. No other complaints.   BP 162/92  Pulse 115  Temp(Src) 99.1 F (37.3 C) (Oral)  Resp 18  Ht 5' (1.524 m)  Wt 187 lb 14.4 oz (85.231 kg)  BMI 36.7 kg/m2  SpO2 94%  Intake/Output Summary (Last 24 hours) at 05/06/13 0744 Last data filed at 05/06/13 1610  Gross per 24 hour  Intake    246 ml  Output   2050 ml  Net  -1804 ml    PHYSICAL EXAM General: Well developed, well nourished, in no acute distress. Alert and oriented x 3.  Psych:  Good affect, responds appropriately Neck: + JVD. No masses noted.  Lungs: Clear bilaterally with slight decrease BS bases. No wheezes or rhonci noted.  Heart: RRR with no murmurs noted. Abdomen: Bowel sounds are present. Soft, non-tender.  Extremities: No lower extremity edema.   LABS: Basic Metabolic Panel:  Recent Labs  96/04/54 1903 05/06/13 0500  NA 140 137  K 3.8 3.9  CL 101 97  CO2 25 29  GLUCOSE 122* 130*  BUN 18 20  CREATININE 1.02 1.25*  CALCIUM 10.1 10.6*   CBC:  Recent Labs  05/05/13 1903  WBC 7.4  HGB 12.0  HCT 34.0*  MCV 65.0*  PLT 210   Cardiac Enzymes:  Recent Labs  05/06/13 0103 05/06/13 0542  TROPONINI <0.30 <0.30   Current Meds: . atorvastatin  40 mg Oral QHS  . buPROPion  150 mg Oral Daily  . carvedilol  25 mg Oral BID WC  . clopidogrel  75 mg Oral Q breakfast  . DULoxetine  60 mg Oral Daily  . enoxaparin (LOVENOX) injection  40 mg Subcutaneous Q24H  . fenofibrate  160 mg Oral Daily  . furosemide  40 mg Oral Daily  . insulin aspart  0-9 Units Subcutaneous TID WC  . linagliptin  5 mg Oral Daily  . pantoprazole  40 mg Oral Daily  . sodium chloride  3 mL Intravenous Q12H     ASSESSMENT AND PLAN: Ms. Carley is a 62 y.o.female with CAD and dilated cardiomyopathy out of proportion to CAD who was recently admitted with NSTEMI and underwent PCI to her RCA with DES at the end of April 2014. Due to low BP's and renal insufficiency her lasix and ARB were held at  discharge and her coreg dose was cut in a half. She was seen in followup in cardiology clinic 05/04/13 and her coreg was increased to 25mg  BID. Admitted with SOB and mild CP 05/05/13.  The CP was much milder than it had been with her recent MI. Her SOB was significant which prompted her to come to the ED. On arrival her BP was 160/90 and her HR was 110. She improved with a nebulizer and IV lasix.   1. Acute on chronic systolic CHF: Lasix had been held over last week. Admitted with elevated BP and volume overload. Improvement of symptoms with diuresis but still with mild volume overload. Will continue IV Lasix today.   2. Non-ischemic cardiomyopathy: Continue coreg 25 BID, Restart ARB (She had been on Benicar 20 mg po Qdaily before last admission but held for hypotension while in hospital, not on formulary). ICD in place.   3. CAD: recent PCI to RCA as above. Admitted with mild CP in setting of volume overload with CHF. No evidence of ACS. Will continue Plavix. She is ASA intolerant. Continue statin.   4. DM: Continue Januvia. SSI.   5. GERD: Continue protonix  6. DVT prophylaxis: lovenox 40mg  daily   MCALHANY,CHRISTOPHER  5/7/20147:44 AM

## 2013-05-06 NOTE — Progress Notes (Signed)
Utilization Review Completed.   Yvana Samonte, RN, BSN Nurse Case Manager  [phone]  

## 2013-05-07 ENCOUNTER — Encounter (HOSPITAL_COMMUNITY): Payer: Self-pay | Admitting: General Practice

## 2013-05-07 DIAGNOSIS — I5023 Acute on chronic systolic (congestive) heart failure: Secondary | ICD-10-CM | POA: Diagnosis present

## 2013-05-07 LAB — BASIC METABOLIC PANEL
BUN: 43 mg/dL — ABNORMAL HIGH (ref 6–23)
Creatinine, Ser: 2.82 mg/dL — ABNORMAL HIGH (ref 0.50–1.10)
GFR calc Af Amer: 20 mL/min — ABNORMAL LOW (ref >90)
GFR calc non Af Amer: 17 mL/min — ABNORMAL LOW (ref >90)
Glucose, Bld: 128 mg/dL — ABNORMAL HIGH (ref 70–99)

## 2013-05-07 LAB — GLUCOSE, CAPILLARY: Glucose-Capillary: 155 mg/dL — ABNORMAL HIGH (ref 70–99)

## 2013-05-07 LAB — CBC
MCH: 22.5 pg — ABNORMAL LOW (ref 26.0–34.0)
MCHC: 34.2 g/dL (ref 30.0–36.0)
RDW: 16.8 % — ABNORMAL HIGH (ref 11.5–15.5)

## 2013-05-07 LAB — PRO B NATRIURETIC PEPTIDE: Pro B Natriuretic peptide (BNP): 800.6 pg/mL — ABNORMAL HIGH (ref 0–125)

## 2013-05-07 MED ORDER — IRBESARTAN 75 MG PO TABS
75.0000 mg | ORAL_TABLET | Freq: Every day | ORAL | Status: DC
  Administered 2013-05-07: 75 mg via ORAL
  Filled 2013-05-07: qty 1

## 2013-05-07 MED ORDER — CARVEDILOL 12.5 MG PO TABS
12.5000 mg | ORAL_TABLET | Freq: Two times a day (BID) | ORAL | Status: DC
  Administered 2013-05-07 – 2013-05-08 (×2): 12.5 mg via ORAL
  Filled 2013-05-07 (×4): qty 1

## 2013-05-07 MED ORDER — ENOXAPARIN SODIUM 30 MG/0.3ML ~~LOC~~ SOLN
30.0000 mg | SUBCUTANEOUS | Status: DC
  Administered 2013-05-08: 30 mg via SUBCUTANEOUS
  Filled 2013-05-07: qty 0.3

## 2013-05-07 MED ORDER — SODIUM CHLORIDE 0.9 % IV SOLN
INTRAVENOUS | Status: DC
  Administered 2013-05-07: 12:00:00 via INTRAVENOUS

## 2013-05-07 NOTE — Progress Notes (Signed)
Pt was having SOB, dizzy and nausea, pt BP dropped from 162/92 HR 115 this AM to 92/62 HR 76 tonight, gave inhaler and Zofran IV, this helped her feel better also performed a bladder scanner that showed no residual, called on call MD and they are aware, will continue to monitor, Thanks, Lavonda Jumbo RN

## 2013-05-07 NOTE — Plan of Care (Signed)
Problem: Phase II Progression Outcomes Goal: Tolerating diet Outcome: Completed/Met Date Met:  05/07/13 Pt tolerating diet well, no c/o n/v

## 2013-05-07 NOTE — Plan of Care (Signed)
Problem: Phase I Progression Outcomes Goal: EF % per last Echo/documented,Core Reminder form on chart Outcome: Completed/Met Date Met:  05/07/13 EF 20-25%(Oct 2013)

## 2013-05-07 NOTE — H&P (Signed)
Patient Name: Andrea [name] Date of Encounter: 05/07/2013   Principal Problem:   Acute on chronic systolic CHF (congestive heart failure) Active Problems:   Hypertension   Coronary artery disease   Dilated cardiomyopathy   Diabetes mellitus type 2, controlled   Dyslipidemia   ICD-Medtronic   SUBJECTIVE  Still feels a little sob, using O2.  Hasn't voided since ~ 10:30 yesterday morning.  Hasn't had the urge.  RN has scanned bladder and no urine present in bladder.  CURRENT MEDS . atorvastatin  40 mg Oral QHS  . buPROPion  150 mg Oral Daily  . carvedilol  25 mg Oral BID WC  . clopidogrel  75 mg Oral Q breakfast  . DULoxetine  60 mg Oral Daily  . enoxaparin (LOVENOX) injection  40 mg Subcutaneous Q24H  . fenofibrate  160 mg Oral Daily  . insulin aspart  0-9 Units Subcutaneous TID WC  . irbesartan  150 mg Oral Daily  . linagliptin  5 mg Oral Daily  . pantoprazole  40 mg Oral Daily  . sodium chloride  3 mL Intravenous Q12H   OBJECTIVE  Filed Vitals:   05/06/13 2030 05/07/13 0248 05/07/13 0252 05/07/13 0618  BP: 92/62 87/50 94/60  92/54  Pulse: 76 75  74  Temp:  98.3 F (36.8 C)  98.3 F (36.8 C)  TempSrc:  Oral  Oral  Resp:  18  18  Height:      Weight:    183 lb 13.8 oz (83.4 kg)  SpO2:  96%  100%    Intake/Output Summary (Last 24 hours) at 05/07/13 0655 Last data filed at 05/07/13 0600  Gross per 24 hour  Intake    730 ml  Output    550 ml  Net    180 ml   Filed Weights   05/06/13 0038 05/06/13 0500 05/07/13 0618  Weight: 187 lb 14.4 oz (85.231 kg) 187 lb 14.4 oz (85.231 kg) 183 lb 13.8 oz (83.4 kg)   PHYSICAL EXAM  General: Pleasant, NAD. Neuro: Alert and oriented X 3. Moves all extremities spontaneously. Psych: Normal affect. HEENT:  Normal  Neck: Supple without bruits or JVD. Lungs:  Resp regular and unlabored, diminished breath sounds bilat R>L. Heart: RRR no s3, s4, or murmurs. Abdomen: Soft, non-tender, non-distended, BS + x 4.    Extremities: No clubbing, cyanosis or edema. DP/PT/Radials 2+ and equal bilaterally.  Accessory Clinical Findings  CBC  Recent Labs  05/05/13 1903  WBC 7.4  HGB 12.0  HCT 34.0*  MCV 65.0*  PLT 210   Basic Metabolic Panel  Recent Labs  05/05/13 1903 05/06/13 0500  NA 140 137  K 3.8 3.9  CL 101 97  CO2 25 29  GLUCOSE 122* 130*  BUN 18 20  CREATININE 1.02 1.25*  CALCIUM 10.1 10.6*   Cardiac Enzymes  Recent Labs  05/06/13 0103 05/06/13 0542 05/06/13 1147  TROPONINI <0.30 <0.30 <0.30   TELE  rsr  Radiology/Studies  Dg Chest 2 View  05/05/2013  *RADIOLOGY REPORT*  Clinical Data: Chest pain, shortness of breath  CHEST - 2 VIEW  Comparison: 04/26/2013  Findings: Cervical fixation hardware.  Stable left subclavian AICD. Heart size upper limits normal.  Lungs are clear.  No effusion. Mild thoracic kyphosis.  Atheromatous aorta.  IMPRESSION:  1.  Stable cardiomegaly and postop changes.  No acute disease.   Original Report Authenticated By: D. Andria Rhein, MD    ASSESSMENT AND PLAN  1.  Acute on chronic  systolic chf/ICM:  Pt with mild volume overload on admission s/p 2 doses of IV lasix.  Weight is down 4lbs since admission and she appears euvolemic on exam.  She has not been out of bed much and is still wearing O2.  She also hasn't voided since yesterday morning.  BMET and pBNP are pending this AM.  F/u labs, d/c O2, ambulate.  Consider d/c pending how she feels with ambulation and labwork.  Cont bb but redcued dose back to 12.5mg  bid in light of relative hypotension.  We may need to hold her arb pending renal fxn this AM.  This was previously held after PCI and only resumed upon admission in setting of htn.  Cont current dose of lasix (pending creat this am).  2.  CAD:  S/p recent PCI of the RCA.  Cont plavix.  ASA intol.  Reduce bb as above.  Cont statin.  CE negative.  3.  HTN:  Though she was hypertensive on admission, she has been relatively hypotensive since.  Will  reduce bb/arb as above.  May need to hold arb altogether.  4.  DM:  Cont ssi/januvia.  Signed, Nicolasa Ducking NP   I have personally seen and examined this patient. I agree with the assessment and plan as outlined above. Will reduce BP meds today. Hold Lasix. Remove supplemental O2. Needs another day to regulate meds, follow BP.   Janet Decesare 7:35 AM 05/07/2013

## 2013-05-07 NOTE — ED Provider Notes (Signed)
I have personally seen and examined the patient.  I have discussed the plan of care with the resident.  I have reviewed the documentation on PMH/FH/Soc. History.  I have reviewed the documentation of the resident and agree.  I have reviewed and agree with the ECG interpretation(s) documented by the resident.   Joya Gaskins, MD 05/07/13 [phone]

## 2013-05-08 DIAGNOSIS — N179 Acute kidney failure, unspecified: Secondary | ICD-10-CM

## 2013-05-08 DIAGNOSIS — I951 Orthostatic hypotension: Secondary | ICD-10-CM | POA: Diagnosis not present

## 2013-05-08 DIAGNOSIS — N289 Disorder of kidney and ureter, unspecified: Secondary | ICD-10-CM | POA: Diagnosis not present

## 2013-05-08 LAB — GLUCOSE, CAPILLARY
Glucose-Capillary: 107 mg/dL — ABNORMAL HIGH (ref 70–99)
Glucose-Capillary: 147 mg/dL — ABNORMAL HIGH (ref 70–99)

## 2013-05-08 LAB — BASIC METABOLIC PANEL
BUN: 47 mg/dL — ABNORMAL HIGH (ref 6–23)
Chloride: 101 mEq/L (ref 96–112)
GFR calc Af Amer: 26 mL/min — ABNORMAL LOW (ref >90)
GFR calc non Af Amer: 22 mL/min — ABNORMAL LOW (ref >90)
Potassium: 4.5 mEq/L (ref 3.5–5.1)
Sodium: 140 mEq/L (ref 135–145)

## 2013-05-08 MED ORDER — CARVEDILOL 25 MG PO TABS: 12.5000 mg | ORAL_TABLET | Freq: Two times a day (BID) | ORAL | Status: DC

## 2013-05-08 MED ORDER — FUROSEMIDE 20 MG PO TABS: 20.0000 mg | ORAL_TABLET | Freq: Every day | ORAL | Status: DC

## 2013-05-08 NOTE — Plan of Care (Signed)
Problem: Phase II Progression Outcomes Goal: Dyspnea controlled with activity Outcome: Completed/Met Date Met:  05/08/13 Pt has not had any c/o SOB with activity Goal: Walk in hall or up in chair TID Outcome: Completed/Met Date Met:  05/08/13 pt oob with walker, tolerating well  Problem: Discharge Progression Outcomes Goal: Able to perform self care activities Outcome: Completed/Met Date Met:  05/08/13 Pt ad lib with ADLs Goal: Discharge plan in place and appropriate Outcome: Completed/Met Date Met:  05/08/13 Pt being dc to home, pt given dc instructions on medications and follow ups, pt leaving via wheelchair with friend, pt stable Goal: Pain controlled with appropriate interventions Outcome: Completed/Met Date Met:  05/08/13 No c/o cp Goal: Tolerating diet Outcome: Completed/Met Date Met:  05/08/13 Pt tolerating diet well,

## 2013-05-08 NOTE — Progress Notes (Signed)
Patient Name: Andrea [name] Date of Encounter: 05/08/2013  Principal Problem:   Acute on chronic systolic CHF (congestive heart failure) Active Problems:   Hypertension   Diabetes mellitus type 2, controlled   Dyslipidemia   Coronary artery disease   Dilated cardiomyopathy   ICD-Medtronic   Orthostatic hypotension    SUBJECTIVE: Breakfast was terrible. Breathing better. Had some dizziness with ambulation yesterday. Has not ambulated today. Still on IVF at 50 cc/hr.  OBJECTIVE Filed Vitals:   05/07/13 0618 05/07/13 1445 05/07/13 2020 05/08/13 0542  BP: 92/54 82/47 87/53  105/67  Pulse: 74 82 84 87  Temp: 98.3 F (36.8 C) 97.9 F (36.6 C) 98.6 F (37 C) 98.2 F (36.8 C)  TempSrc: Oral Oral Oral Oral  Resp: 18 18 18 16   Height:      Weight: 183 lb 13.8 oz (83.4 kg)   187 lb 3.2 oz (84.913 kg)  SpO2: 100% 95% 96% 99%    Intake/Output Summary (Last 24 hours) at 05/08/13 0913 Last data filed at 05/08/13 0700  Gross per 24 hour  Intake 1783.83 ml  Output    550 ml  Net 1233.83 ml   Filed Weights   05/06/13 0500 05/07/13 0618 05/08/13 0542  Weight: 187 lb 14.4 oz (85.231 kg) 183 lb 13.8 oz (83.4 kg) 187 lb 3.2 oz (84.913 kg)    PHYSICAL EXAM General: Well developed, well nourished, female in no acute distress. Head: Normocephalic, atraumatic.  Neck: Supple without bruits, JVD not elevated. Lungs:  Resp regular and unlabored, few rales bases. Heart: RRR, S1, S2, no S3, S4, or murmur; no rub. Abdomen: Soft, non-tender, non-distended, BS + x 4.  Extremities: No clubbing, cyanosis, no edema.  Neuro: Alert and oriented X 3. Moves all extremities spontaneously. Psych: Normal affect.  LABS: CBC:  Recent Labs  05/05/13 1903 05/07/13 0630  WBC 7.4 6.2  HGB 12.0 11.6*  HCT 34.0* 33.9*  MCV 65.0* 65.8*  PLT 210 220   INR:  Recent Labs  05/05/13 1903  INR 1.07   Basic Metabolic Panel:  Recent Labs  16/10/96 0630 05/08/13 0548  NA 137 140  K 4.7  4.5  CL 95* 101  CO2 24 30  GLUCOSE 128* 118*  BUN 43* 47*  CREATININE 2.82* 2.26*  CALCIUM 9.5 9.1   Cardiac Enzymes:  Recent Labs  05/06/13 0103 05/06/13 0542 05/06/13 1147  TROPONINI <0.30 <0.30 <0.30    Recent Labs  05/05/13 1928  TROPIPOC 0.00   BNP: Pro B Natriuretic peptide (BNP)  Date/Time Value Range Status  05/07/2013  6:30 AM 800.6* 0 - 125 pg/mL Final  05/05/2013  7:03 PM 1527.0* 0 - 125 pg/mL Final   TELE:  SR   Current Medications:  . atorvastatin  40 mg Oral QHS  . buPROPion  150 mg Oral Daily  . carvedilol  12.5 mg Oral BID WC  . clopidogrel  75 mg Oral Q breakfast  . DULoxetine  60 mg Oral Daily  . enoxaparin (LOVENOX) injection  30 mg Subcutaneous Q24H  . fenofibrate  160 mg Oral Daily  . insulin aspart  0-9 Units Subcutaneous TID WC  . linagliptin  5 mg Oral Daily  . pantoprazole  40 mg Oral Daily  . sodium chloride  3 mL Intravenous Q12H   . sodium chloride 50 mL/hr at 05/07/13 1211    ASSESSMENT AND PLAN: 62 yo female with recent NSTEMI and DES RCA. EF 20%, ICM with ICD. D/C 4/30.  Admitted 5/6  with SOB/CP, HTN. Improved with IV Lasix and nebs but BUN/CR increased yest and pt with decreased UOP and low BP. ARB held, BB decreased and am dose skipped yest. Pt hydrated. UOP has improved. Wt back up to admit wt but is still lower than prev admission wts.  Plan - continue IVF for now. Check orthostatic VS. If neg, d/c IVF. If positive, continue fluids. Ambulate and see how tolerated. Possible d/c later today if does well.  Change to ADA carb mod diet.  Otherwise, continue current therapies.  Principal Problem:   Acute on chronic systolic CHF (congestive heart failure) Active Problems:   Hypertension   Diabetes mellitus type 2, controlled   Dyslipidemia   Coronary artery disease   Dilated cardiomyopathy   ICD-Medtronic   Orthostatic hypotension   Signed, Theodore Demark , PA-C 9:13 AM 05/08/2013  I have personally seen and examined this  patient. I agree with the assessment and plan as outlined above. Will ambulate today. If she feels well after IVF this am and orthostatics ok, will d/c home later today. Would resume low dose lasix tomorrow at 20 mg po Qdaily. Close f/u in office next week.   Andrea [name] 10:23 AM 05/08/2013

## 2013-05-08 NOTE — Discharge Summary (Signed)
See full note this am. cdm 

## 2013-05-08 NOTE — Progress Notes (Signed)
SATURATION QUALIFICATIONS: (This note is used to comply with regulatory documentation for home oxygen)  Patient Saturations on Room Air at Rest = 94%  Patient Saturations on Room Air while Ambulating = 94%  Patient Saturations on room air Liters of oxygen while Ambulating = 94%  Please briefly explain why patient needs home oxygen: pt ambulated in hallway on room air o2 maintained 94%, no c/o SOB

## 2013-05-08 NOTE — Discharge Summary (Signed)
CARDIOLOGY DISCHARGE SUMMARY   Patient ID: Andrea [name] MRN: [id number] DOB/AGE: 09-30-1951 62 y.o.  Admit date: 05/05/2013 Discharge date: 05/08/2013  Primary Discharge Diagnosis:     Acute on chronic systolic CHF (congestive heart failure)  Secondary Discharge Diagnosis:    Hypertension   Diabetes mellitus type 2, controlled   Dyslipidemia   Coronary artery disease   Dilated cardiomyopathy   ICD-Medtronic   Orthostatic hypotension   Acute renal insufficiency  Hospital Course: MALAYAH [name] is a 62 y.o. female with a history of CAD. She is a 62 yo female with recent NSTEMI and DES RCA. EF 20%, ICM with ICD. D/C 4/30. She developed increasing SOB and came to the hospital where she was admitted for further evaluation and diuresis.   She was diuresed with IV Lasix and her respiratory status improved. However, with diuresis her BUN/Cr increased, the creatinines listed below. She was hypotensive and symptomatic with this. Her Lasix was held and there was concern that her irbesartan was playing a role, so it was discontinued. Her Coreg was held and then restarted at a lower dose. She received gentle hydration.   Her symptoms improved and her respiratory status remained good. Her BNP was rechecked and had improved. Her blood sugars were managed with a combination of home meds and sliding scale insulin.  On 05/08/2013, [name] was seen by Dr Clifton James. Her orthostatic VS were checked and were improved. She was ambulating without chest pain or SOB and intending her oxygen saturation is 94% with ambulation. She was considered stable for discharge, to follow up as an outpatient.  Filed Vitals:   05/08/13 1052 05/08/13 1054 05/08/13 1056  BP: 80/49 88/58 100/66  Pulse: 79 85 83  Temp:     TempSrc:     Resp:      Position Lying Sitting Standing  Weight:     SpO2:      Labs:  Lab Results  Component Value Date   WBC 6.2 05/07/2013   HGB 11.6* 05/07/2013   HCT 33.9* 05/07/2013     MCV 65.8* 05/07/2013   PLT 220 05/07/2013     Recent Labs Lab 05/08/13 0548  NA 140  K 4.5  CL 101  CO2 30  BUN 47*  CREATININE 2.26*  CALCIUM 9.1  GLUCOSE 118*   Creatinine, Ser  Date Value Range Status  05/08/2013 2.26* 0.50 - 1.10 mg/dL Final  4/0/9811 9.14* 7.82 - 1.10 mg/dL Final  09/04/6212 0.86* 5.78 - 1.10 mg/dL Final  04/05/9628 5.28  4.13 - 1.10 mg/dL Final    Recent Labs  24/40/10 0103 05/06/13 0542 05/06/13 1147  TROPONINI <0.30 <0.30 <0.30   Pro B Natriuretic peptide (BNP)  Date/Time Value Range Status  05/07/2013  6:30 AM 800.6* 0 - 125 pg/mL Final  05/05/2013  7:03 PM 1527.0* 0 - 125 pg/mL Final    Recent Labs  05/05/13 1903  INR 1.07      Radiology: Dg Chest 2 View 05/05/2013  *RADIOLOGY REPORT*  Clinical Data: Chest pain, shortness of breath  CHEST - 2 VIEW  Comparison: 04/26/2013  Findings: Cervical fixation hardware.  Stable left subclavian AICD. Heart size upper limits normal.  Lungs are clear.  No effusion. Mild thoracic kyphosis.  Atheromatous aorta.  IMPRESSION:  1.  Stable cardiomegaly and postop changes.  No acute disease.   Original Report Authenticated By: D. Andria Rhein, MD      EKG: 06-May-2013 06:12:29  Sinus tachycardia Incomplete left bundle branch block  Left ventricular hypertrophy with repolarization abnormality Abnormal ECG 19mm/s 37mm/mV 100Hz  8.0.1 12SL 239 CID: 20 Referred by: BULTER Unconfirmed Vent. rate 111 BPM PR interval 150 ms QRS duration 110 ms QT/QTc 358/486 ms P-R-T axes 57 -13 105  FOLLOW UP PLANS AND APPOINTMENTS Allergies  Allergen Reactions  . Aspirin Shortness Of Breath and Other (See Comments)    Asthma symptoms  . Brilinta (Ticagrelor) Shortness Of Breath and Other (See Comments)    Gout   . Percocet (Oxycodone-Acetaminophen) Nausea And Vomiting  . Darvon Nausea And Vomiting  . Imitrex (Sumatriptan Base) Other (See Comments)    Unknown reaction  . Nsaids Nausea And Vomiting     Medication List     TAKE these medications       acetaminophen 650 MG CR tablet  Commonly known as:  TYLENOL  Take 1,300 mg by mouth every 8 (eight) hours as needed for pain.     atorvastatin 40 MG tablet  Commonly known as:  LIPITOR  Take 1 tablet by mouth at bedtime.     buPROPion 150 MG 24 hr tablet  Commonly known as:  WELLBUTRIN XL  Take 150 mg by mouth daily.     carvedilol 25 MG tablet  Commonly known as:  COREG  Take 0.5 tablets (12.5 mg total) by mouth 2 (two) times daily with a meal.     clopidogrel 75 MG tablet  Commonly known as:  PLAVIX  Take 1 tablet (75 mg total) by mouth daily with breakfast.     DULoxetine 60 MG capsule  Commonly known as:  CYMBALTA  Take 60 mg by mouth daily.     fenofibrate 145 MG tablet  Commonly known as:  TRICOR  Take 1 tablet (145 mg total) by mouth daily.     furosemide 20 MG tablet  Commonly known as:  LASIX  Take 1 tablet (20 mg total) by mouth daily.     JANUVIA 100 MG tablet  Generic drug:  sitaGLIPtin  Take 100 mg by mouth Daily.     pantoprazole 40 MG tablet  Commonly known as:  PROTONIX  Take 40 mg by mouth daily.        Discharge Orders   Future Appointments Provider Department Dept Phone   05/11/2013 10:30 AM Kathleene Hazel, MD [facility] Main Office [place]) [phone]   05/20/2013 4:30 PM [facility] [employer] Main Office [place]) [phone]   07/10/2013 4:00 PM Peter M Swaziland, MD [facility] Main Office [place]) [phone]   11/09/2013 8:15 AM Sherrie George, MD [facility] [phone]   Future Orders Complete By Expires     (HEART FAILURE PATIENTS) Call MD:  Anytime you have any of the following symptoms: 1) 3 pound weight gain in 24 hours or 5 pounds in 1 week 2) shortness of breath, with or without a dry hacking cough 3) swelling in the hands, feet or stomach 4) if you have to sleep on extra pillows at night in order to breathe.  As directed     Diet - low  sodium heart healthy  As directed     Diet Carb Modified  As directed     Increase activity slowly  As directed       Follow-up Information   Follow up with Verne Carrow, MD On 05/11/2013. (at 10:30 am)    Contact information:   [address] [place] [address] [phone]       BRING  ALL MEDICATIONS WITH YOU TO FOLLOW UP APPOINTMENTS  Time spent with patient to include physician time: 43 min Signed: Theodore Demark, PA-C 05/08/2013, 12:37 PM Co-Sign MD

## 2013-05-11 ENCOUNTER — Encounter: Payer: BC Managed Care – PPO | Admitting: Cardiovascular Disease

## 2013-05-11 ENCOUNTER — Encounter: Payer: Self-pay | Admitting: Internal Medicine

## 2013-05-11 NOTE — Progress Notes (Signed)
Pt is followed by Dr. Swaziland. Added onto my schedule today for hospital f/u. She did not show for her appt. cdm

## 2013-05-13 ENCOUNTER — Other Ambulatory Visit: Payer: Self-pay | Admitting: *Deleted

## 2013-05-13 DIAGNOSIS — N289 Disorder of kidney and ureter, unspecified: Secondary | ICD-10-CM

## 2013-05-18 ENCOUNTER — Telehealth: Payer: Self-pay | Admitting: Cardiology

## 2013-05-18 NOTE — Telephone Encounter (Signed)
Pt's family member [name] aware that pt needs to start taken Lasix 20 mg today and recheck BMET on Friday 05/22/13. Please see labs on recent labs.

## 2013-05-18 NOTE — Telephone Encounter (Signed)
Left pt a message to call back. 

## 2013-05-18 NOTE — Telephone Encounter (Signed)
Called pt ; Phone continues to go into messages.

## 2013-05-18 NOTE — Telephone Encounter (Signed)
New problem   Pt returning a call from Triage.

## 2013-05-20 ENCOUNTER — Other Ambulatory Visit: Payer: BC Managed Care – PPO

## 2013-05-22 ENCOUNTER — Other Ambulatory Visit: Payer: BC Managed Care – PPO

## 2013-06-16 ENCOUNTER — Encounter: Payer: Self-pay | Admitting: Cardiovascular Disease

## 2013-06-16 ENCOUNTER — Ambulatory Visit
Admission: RE | Admit: 2013-06-16 | Discharge: 2013-06-16 | Disposition: A | Discharge status: Home / Self Care | Payer: BC Managed Care – PPO | Source: Ambulatory Visit | Attending: Family Medicine | Admitting: Family Medicine

## 2013-06-16 ENCOUNTER — Other Ambulatory Visit: Payer: Self-pay | Admitting: Family Medicine

## 2013-06-16 MED ORDER — IOHEXOL 300 MG/ML  SOLN
50.0000 mL | Freq: Once | INTRAMUSCULAR | Status: AC | PRN
  Administered 2013-06-16: 50 mL via INTRAVENOUS

## 2013-07-02 ENCOUNTER — Other Ambulatory Visit: Payer: Self-pay | Admitting: Nephrology

## 2013-07-08 ENCOUNTER — Other Ambulatory Visit: Payer: BC Managed Care – PPO

## 2013-07-10 ENCOUNTER — Ambulatory Visit (INDEPENDENT_AMBULATORY_CARE_PROVIDER_SITE_OTHER): Payer: BC Managed Care – PPO | Admitting: Cardiology

## 2013-07-10 ENCOUNTER — Encounter: Payer: Self-pay | Admitting: Cardiology

## 2013-07-10 VITALS — BP 138/90 | HR 106 | Ht 60.0 in | Wt 195.1 lb

## 2013-07-10 DIAGNOSIS — I509 Heart failure, unspecified: Secondary | ICD-10-CM

## 2013-07-10 DIAGNOSIS — I2510 Atherosclerotic heart disease of native coronary artery without angina pectoris: Secondary | ICD-10-CM

## 2013-07-10 DIAGNOSIS — I259 Chronic ischemic heart disease, unspecified: Secondary | ICD-10-CM

## 2013-07-10 DIAGNOSIS — I5022 Chronic systolic (congestive) heart failure: Secondary | ICD-10-CM

## 2013-07-10 MED ORDER — CARVEDILOL 25 MG PO TABS: 25.0000 mg | ORAL_TABLET | Freq: Two times a day (BID) | ORAL | Status: DC

## 2013-07-10 NOTE — Patient Instructions (Signed)
Continue your current therapy  I will see you in 6 months.   

## 2013-07-10 NOTE — Progress Notes (Signed)
[name] Date of Birth: August 31, 1951   History of Present Illness: Andrea [name] is seen for followup today. She is status post stenting of the distal right coronary with a 3.5 x 16 mm Promus Element stent on December 15, 2010.  In April of 2012 she had subtotal occlusion of the proximal RCA and this was stented with a 3.5 x 16 mm Promus Element stent. This was in an area that on previous catheterization had only mild disease. In June of 2012 showed repeat cardiac catheterization that showed nonobstructive disease. She also has a history of cardiomyopathy with ejection fraction of 20-25%. She is status post single chamber ICD. She was admitted in October with profound hypotension and acute renal insufficiency. Her ARB was held. Her renal function improved. She was started back on her carvedilol and Lasix. She previously had significant wheezing on Brilinta. On followup today she is doing very well from a cardiac standpoint. She has had no chest pain or shortness of breath. She denies any increase in edema. She has gained 7 pounds but she attributes this to being seen. She also complains of frequent hot flashes.  Current Outpatient Prescriptions on File Prior to Visit  Medication Sig Dispense Refill  . acetaminophen (TYLENOL) 650 MG CR tablet Take 1,300 mg by mouth every 8 (eight) hours as needed for pain.      Marland Kitchen atorvastatin (LIPITOR) 40 MG tablet Take 1 tablet by mouth at bedtime.      Marland Kitchen buPROPion (WELLBUTRIN XL) 150 MG 24 hr tablet Take 150 mg by mouth daily.       . clopidogrel (PLAVIX) 75 MG tablet Take 1 tablet (75 mg total) by mouth daily with breakfast.  30 tablet  11  . DULoxetine (CYMBALTA) 60 MG capsule Take 60 mg by mouth daily.        . furosemide (LASIX) 20 MG tablet Take 1 tablet (20 mg total) by mouth daily.  30 tablet  11  . JANUVIA 100 MG tablet Take 100 mg by mouth Daily.        No current facility-administered medications on file prior to visit.    Allergies  Allergen  Reactions  . Aspirin Shortness Of Breath and Other (See Comments)    Asthma symptoms  . Brilinta (Ticagrelor) Shortness Of Breath and Other (See Comments)    Gout   . Percocet (Oxycodone-Acetaminophen) Nausea And Vomiting  . Darvon Nausea And Vomiting  . Imitrex (Sumatriptan Base) Other (See Comments)    Unknown reaction  . Nsaids Nausea And Vomiting    Past Medical History  Diagnosis Date  . Chronic systolic heart failure   . Hypertension   . Stroke 02/2010    left brain CVA? recurrent TIA's treated with TPA  . Dyslipidemia   . Premature ventricular contractions (PVCs) (VPCs)   . GERD (gastroesophageal reflux disease)   . Coronary artery disease     a. DES-dRCA 11/2010. b. DES-pRCA 04/2011 (in an area free of disease on prior cath). c. s/p DES-mRCA 03/2013 for NSTEMI.  . Ischemic cardiomyopathy     a. Hx of medtronic ICD. b. EF 15-20% by cath 03/2013.  . Pulmonary hypertension   . Gout   . Obesity   . Thrombocytopenia   . Depression   . Hypotension     a. Admission 09/2012 for hypotn & ARF. b. Meds held 03/2013 due to hypotension.  . Acute renal insufficiency     a. 09/2012. b. Also noted post-cath 03/2013.  . NSTEMI (  non-ST elevated myocardial infarction) 03/2013  . CHF (congestive heart failure)   . Anginal pain   . Asthma   . Pneumonia ~ 2001    "double" (05/07/2013)  . Shortness of breath     "can happen at anytime" (05/07/2013)  . Type II diabetes mellitus   . Microcytic anemia     a. Noted 03/2013 on labs, iron studies WNL.  Marland Kitchen Migraine headache     "no pain; aura/visual problems only; at least 2-3X/month" (05/07/2013)  . Headaches, cluster     "monthly" (05/07/2013)  . Arthritis     "knees" (05/07/2013)  . DJD (degenerative joint disease)   . Chronic lower back pain   . ICD (implantable cardiac defibrillator) in place   . Anxiety     Past Surgical History  Procedure Laterality Date  . Mass excision      left hip  . Cervical polypectomy  1970's  . US  echocardiography  2011  . Tonsillectomy  1960's?  . Right oophorectomy  1980's  . Dilation and curettage of uterus  1970's  . Ostectomy metatarsal Left 1993    "5th toe; from rickets" (05/07/2013)  . Coronary angioplasty with stent placement  2000's    "2 stents" (05/07/2013)  . Coronary angioplasty with stent placement  03/2013    PCI to RCA/notes 05/05/2013; "makes total of 3" (05/07/2013)  . Implantable cardioverter defibrillator implant      History  Smoking status  . Former Smoker -- 0.05 packs/day for 5 years  . Types: Cigarettes  . Quit date: 04/23/1982  Smokeless tobacco  . Never Used    History  Alcohol Use No    Family History  Problem Relation Age of Onset  . Heart failure Mother   . Heart attack Father   . Heart disease Brother     Review of Systems: As noted in history of present illness. All other systems were reviewed and are negative.  Physical Exam: BP 138/90  Pulse 106  Ht 5' (1.524 m)  Wt 195 lb 1.9 oz (88.506 kg)  BMI 38.11 kg/m2  SpO2 96% She is an obese black female in no acute distress. HEENT exam is unremarkable. She has no JVD or bruits. Lungs are clear. Cardiac exam reveals a regular rate and rhythm without gallop, murmur, or click. ICD is in place. There is no swelling or tenderness around the ICD site. Abdomen is obese, soft, nontender. Pedal pulses are good. She has no edema. She is alert and oriented x3. No focal findings. LABORATORY DATA:   Assessment / Plan: 1. Chronic congestive heart failure with ejection fraction of 20-25%. She is well compensated on exam. Continue carvedilol and Lasix at current dose.  2. Coronary disease status post prior stenting of the distal and proximal RCA as noted above. Asymptomatic.  3. Hypertension blood pressure is well controlled.  4. diabetes mellitus type 2. I stressed the importance of dietary modification. She reports her blood sugars are doing well.

## 2013-07-13 ENCOUNTER — Ambulatory Visit
Admission: RE | Admit: 2013-07-13 | Discharge: 2013-07-13 | Disposition: A | Discharge status: Home / Self Care | Payer: BC Managed Care – PPO | Source: Ambulatory Visit | Attending: Nephrology | Admitting: Nephrology

## 2013-08-19 ENCOUNTER — Encounter (HOSPITAL_COMMUNITY): Payer: Self-pay | Admitting: Adult Health

## 2013-08-19 ENCOUNTER — Inpatient Hospital Stay (HOSPITAL_COMMUNITY): Payer: BC Managed Care – PPO

## 2013-08-19 ENCOUNTER — Other Ambulatory Visit: Payer: Self-pay

## 2013-08-19 ENCOUNTER — Inpatient Hospital Stay (HOSPITAL_COMMUNITY)
Admission: EM | Admit: 2013-08-19 | Discharge: 2013-08-25 | DRG: 882 | Disposition: A | Discharge status: Home Health | Payer: BC Managed Care – PPO | Attending: Pulmonary Disease | Admitting: Pulmonary Disease

## 2013-08-19 ENCOUNTER — Emergency Department (HOSPITAL_COMMUNITY): Payer: BC Managed Care – PPO

## 2013-08-19 DIAGNOSIS — I498 Other specified cardiac arrhythmias: Secondary | ICD-10-CM | POA: Diagnosis present

## 2013-08-19 DIAGNOSIS — E872 Acidosis, unspecified: Secondary | ICD-10-CM | POA: Diagnosis present

## 2013-08-19 DIAGNOSIS — R7881 Bacteremia: Secondary | ICD-10-CM | POA: Diagnosis present

## 2013-08-19 DIAGNOSIS — I252 Old myocardial infarction: Secondary | ICD-10-CM

## 2013-08-19 DIAGNOSIS — E785 Hyperlipidemia, unspecified: Secondary | ICD-10-CM | POA: Diagnosis present

## 2013-08-19 DIAGNOSIS — I429 Cardiomyopathy, unspecified: Secondary | ICD-10-CM | POA: Diagnosis present

## 2013-08-19 DIAGNOSIS — R4182 Altered mental status, unspecified: Secondary | ICD-10-CM

## 2013-08-19 DIAGNOSIS — Z87891 Personal history of nicotine dependence: Secondary | ICD-10-CM

## 2013-08-19 DIAGNOSIS — I5021 Acute systolic (congestive) heart failure: Secondary | ICD-10-CM | POA: Diagnosis present

## 2013-08-19 DIAGNOSIS — J811 Chronic pulmonary edema: Secondary | ICD-10-CM

## 2013-08-19 DIAGNOSIS — D696 Thrombocytopenia, unspecified: Secondary | ICD-10-CM | POA: Diagnosis not present

## 2013-08-19 DIAGNOSIS — I5023 Acute on chronic systolic (congestive) heart failure: Secondary | ICD-10-CM | POA: Diagnosis present

## 2013-08-19 DIAGNOSIS — I129 Hypertensive chronic kidney disease with stage 1 through stage 4 chronic kidney disease, or unspecified chronic kidney disease: Secondary | ICD-10-CM | POA: Diagnosis present

## 2013-08-19 DIAGNOSIS — G9340 Encephalopathy, unspecified: Secondary | ICD-10-CM | POA: Diagnosis present

## 2013-08-19 DIAGNOSIS — J90 Pleural effusion, not elsewhere classified: Secondary | ICD-10-CM

## 2013-08-19 DIAGNOSIS — R509 Fever, unspecified: Secondary | ICD-10-CM

## 2013-08-19 DIAGNOSIS — N183 Chronic kidney disease, stage 3 unspecified: Secondary | ICD-10-CM | POA: Diagnosis present

## 2013-08-19 DIAGNOSIS — J9600 Acute respiratory failure, unspecified whether with hypoxia or hypercapnia: Principal | ICD-10-CM

## 2013-08-19 DIAGNOSIS — I059 Rheumatic mitral valve disease, unspecified: Secondary | ICD-10-CM

## 2013-08-19 DIAGNOSIS — Z8673 Personal history of transient ischemic attack (TIA), and cerebral infarction without residual deficits: Secondary | ICD-10-CM

## 2013-08-19 DIAGNOSIS — J9601 Acute respiratory failure with hypoxia: Secondary | ICD-10-CM | POA: Diagnosis present

## 2013-08-19 DIAGNOSIS — N179 Acute kidney failure, unspecified: Secondary | ICD-10-CM | POA: Diagnosis present

## 2013-08-19 DIAGNOSIS — I509 Heart failure, unspecified: Secondary | ICD-10-CM | POA: Diagnosis present

## 2013-08-19 DIAGNOSIS — Z95810 Presence of automatic (implantable) cardiac defibrillator: Secondary | ICD-10-CM

## 2013-08-19 DIAGNOSIS — J189 Pneumonia, unspecified organism: Secondary | ICD-10-CM | POA: Diagnosis present

## 2013-08-19 DIAGNOSIS — I2510 Atherosclerotic heart disease of native coronary artery without angina pectoris: Secondary | ICD-10-CM | POA: Diagnosis present

## 2013-08-19 DIAGNOSIS — E119 Type 2 diabetes mellitus without complications: Secondary | ICD-10-CM | POA: Diagnosis present

## 2013-08-19 HISTORY — DX: Essential (primary) hypertension: I10

## 2013-08-19 HISTORY — DX: Primary pulmonary hypertension: I27.0

## 2013-08-19 HISTORY — DX: Chronic kidney disease, stage 3 unspecified: N18.30

## 2013-08-19 HISTORY — DX: Chronic kidney disease, stage 3 (moderate): N18.3

## 2013-08-19 LAB — POCT I-STAT, CHEM 8
Creatinine, Ser: 1.2 mg/dL — ABNORMAL HIGH (ref 0.50–1.10)
HCT: 46 % (ref 36.0–46.0)
Hemoglobin: 15.6 g/dL — ABNORMAL HIGH (ref 12.0–15.0)
Potassium: 4.4 mEq/L (ref 3.5–5.1)
Sodium: 142 mEq/L (ref 135–145)

## 2013-08-19 LAB — RAPID URINE DRUG SCREEN, HOSP PERFORMED
Cocaine: NOT DETECTED
Opiates: NOT DETECTED

## 2013-08-19 LAB — URINALYSIS, ROUTINE W REFLEX MICROSCOPIC
Bilirubin Urine: NEGATIVE
Glucose, UA: 500 mg/dL — AB
Hgb urine dipstick: NEGATIVE
Ketones, ur: NEGATIVE mg/dL
Leukocytes, UA: NEGATIVE
Nitrite: NEGATIVE
Protein, ur: 100 mg/dL — AB
Protein, ur: NEGATIVE mg/dL
Urobilinogen, UA: 0.2 mg/dL (ref 0.0–1.0)
pH: 6.5 (ref 5.0–8.0)

## 2013-08-19 LAB — BASIC METABOLIC PANEL
CO2: 26 mEq/L (ref 19–32)
Calcium: 9.2 mg/dL (ref 8.4–10.5)
GFR calc non Af Amer: 68 mL/min — ABNORMAL LOW (ref >90)
Potassium: 3.6 mEq/L (ref 3.5–5.1)
Sodium: 141 mEq/L (ref 135–145)

## 2013-08-19 LAB — PRO B NATRIURETIC PEPTIDE: Pro B Natriuretic peptide (BNP): 1769 pg/mL — ABNORMAL HIGH (ref 0–125)

## 2013-08-19 LAB — CBC WITH DIFFERENTIAL/PLATELET
Basophils Relative: 0 % (ref 0–1)
Eosinophils Absolute: 0.3 10*3/uL (ref 0.0–0.7)
HCT: 41.4 % (ref 36.0–46.0)
Lymphocytes Relative: 36 % (ref 12–46)
MCH: 23.6 pg — ABNORMAL LOW (ref 26.0–34.0)
MCV: 70.2 fL — ABNORMAL LOW (ref 78.0–100.0)
Monocytes Absolute: 0.6 10*3/uL (ref 0.1–1.0)
RDW: 16.8 % — ABNORMAL HIGH (ref 11.5–15.5)
WBC: 11.6 10*3/uL — ABNORMAL HIGH (ref 4.0–10.5)

## 2013-08-19 LAB — GLUCOSE, CAPILLARY
Glucose-Capillary: 216 mg/dL — ABNORMAL HIGH (ref 70–99)
Glucose-Capillary: 213 mg/dL — ABNORMAL HIGH (ref 70–99)
Glucose-Capillary: 145 mg/dL — ABNORMAL HIGH (ref 70–99)

## 2013-08-19 LAB — LACTIC ACID, PLASMA
Lactic Acid, Venous: 2.8 mmol/L — ABNORMAL HIGH (ref 0.5–2.2)
Lactic Acid, Venous: 1.7 mmol/L (ref 0.5–2.2)

## 2013-08-19 LAB — POCT I-STAT 3, ART BLOOD GAS (G3+)
Bicarbonate: 22.6 mEq/L (ref 20.0–24.0)
Patient temperature: 100.7
Patient temperature: 100.3
TCO2: 24 mmol/L (ref 0–100)
pH, Arterial: 7.198 — CL (ref 7.350–7.450)
pH, Arterial: 7.365 (ref 7.350–7.450)
pO2, Arterial: 104 mmHg — ABNORMAL HIGH (ref 80.0–100.0)
pO2, Arterial: 174 mmHg — ABNORMAL HIGH (ref 80.0–100.0)

## 2013-08-19 LAB — HEMOGLOBIN A1C: Hgb A1c MFr Bld: 6.4 % — ABNORMAL HIGH (ref <5.7)

## 2013-08-19 LAB — COMPREHENSIVE METABOLIC PANEL
ALT: 18 U/L (ref 0–35)
Albumin: 3.8 g/dL (ref 3.5–5.2)
Alkaline Phosphatase: 60 U/L (ref 39–117)
BUN: 15 mg/dL (ref 6–23)
Chloride: 100 mEq/L (ref 96–112)
Potassium: 4.8 mEq/L (ref 3.5–5.1)
Sodium: 139 mEq/L (ref 135–145)
Total Bilirubin: 0.4 mg/dL (ref 0.3–1.2)
Total Protein: 7.1 g/dL (ref 6.0–8.3)

## 2013-08-19 LAB — URINE MICROSCOPIC-ADD ON

## 2013-08-19 LAB — PROCALCITONIN: Procalcitonin: 4.49 ng/mL

## 2013-08-19 LAB — PROTIME-INR: Prothrombin Time: 13.7 seconds (ref 11.6–15.2)

## 2013-08-19 LAB — POCT I-STAT TROPONIN I

## 2013-08-19 LAB — CG4 I-STAT (LACTIC ACID): Lactic Acid, Venous: 7.27 mmol/L — ABNORMAL HIGH (ref 0.5–2.2)

## 2013-08-19 MED ORDER — CHLORHEXIDINE GLUCONATE 0.12 % MT SOLN
15.0000 mL | Freq: Two times a day (BID) | OROMUCOSAL | Status: DC
  Administered 2013-08-19 – 2013-08-20 (×4): 15 mL via OROMUCOSAL
  Filled 2013-08-19 (×4): qty 15

## 2013-08-19 MED ORDER — ASPIRIN 81 MG PO CHEW: 324.0000 mg | CHEWABLE_TABLET | ORAL | Status: AC

## 2013-08-19 MED ORDER — ALBUTEROL SULFATE (5 MG/ML) 0.5% IN NEBU
2.5000 mg | INHALATION_SOLUTION | Freq: Four times a day (QID) | RESPIRATORY_TRACT | Status: DC
  Administered 2013-08-19 – 2013-08-21 (×7): 2.5 mg via RESPIRATORY_TRACT
  Filled 2013-08-19 (×7): qty 0.5

## 2013-08-19 MED ORDER — FUROSEMIDE 10 MG/ML IJ SOLN
40.0000 mg | Freq: Three times a day (TID) | INTRAMUSCULAR | Status: AC
  Administered 2013-08-19 – 2013-08-20 (×3): 40 mg via INTRAVENOUS
  Filled 2013-08-19 (×3): qty 4

## 2013-08-19 MED ORDER — IPRATROPIUM BROMIDE 0.02 % IN SOLN: 0.5000 mg | RESPIRATORY_TRACT | Status: DC

## 2013-08-19 MED ORDER — ALBUTEROL SULFATE (5 MG/ML) 0.5% IN NEBU: 2.5000 mg | INHALATION_SOLUTION | Freq: Four times a day (QID) | RESPIRATORY_TRACT | Status: DC

## 2013-08-19 MED ORDER — FENTANYL CITRATE 0.05 MG/ML IJ SOLN
50.0000 ug | INTRAMUSCULAR | Status: DC | PRN
  Administered 2013-08-19 – 2013-08-20 (×5): 100 ug via INTRAVENOUS
  Filled 2013-08-19 (×5): qty 2

## 2013-08-19 MED ORDER — DEXTROSE 5 % IV SOLN
2.0000 g | Freq: Once | INTRAVENOUS | Status: AC
  Administered 2013-08-19: 2 g via INTRAVENOUS
  Filled 2013-08-19: qty 2

## 2013-08-19 MED ORDER — BIOTENE DRY MOUTH MT LIQD
15.0000 mL | Freq: Four times a day (QID) | OROMUCOSAL | Status: DC
  Administered 2013-08-19 – 2013-08-25 (×21): 15 mL via OROMUCOSAL

## 2013-08-19 MED ORDER — MIDAZOLAM HCL 2 MG/2ML IJ SOLN
2.0000 mg | INTRAMUSCULAR | Status: DC | PRN
  Administered 2013-08-19: 2 mg via INTRAVENOUS
  Administered 2013-08-20: 4 mg via INTRAVENOUS
  Administered 2013-08-20: 2 mg via INTRAVENOUS
  Filled 2013-08-19: qty 4
  Filled 2013-08-19 (×2): qty 2

## 2013-08-19 MED ORDER — MIDAZOLAM HCL 2 MG/2ML IJ SOLN: 2.0000 mg | Freq: Once | INTRAMUSCULAR | Status: AC

## 2013-08-19 MED ORDER — IPRATROPIUM BROMIDE 0.02 % IN SOLN
0.5000 mg | Freq: Four times a day (QID) | RESPIRATORY_TRACT | Status: DC
  Administered 2013-08-19 – 2013-08-21 (×7): 0.5 mg via RESPIRATORY_TRACT
  Filled 2013-08-19 (×7): qty 2.5

## 2013-08-19 MED ORDER — ASPIRIN 300 MG RE SUPP
300.0000 mg | RECTAL | Status: AC
  Administered 2013-08-19: 300 mg via RECTAL
  Filled 2013-08-19: qty 1

## 2013-08-19 MED ORDER — INSULIN GLARGINE 100 UNIT/ML ~~LOC~~ SOLN
5.0000 [IU] | Freq: Every day | SUBCUTANEOUS | Status: DC
  Administered 2013-08-19 – 2013-08-20 (×2): 5 [IU] via SUBCUTANEOUS
  Filled 2013-08-19 (×2): qty 0.05

## 2013-08-19 MED ORDER — VANCOMYCIN HCL IN DEXTROSE 750-5 MG/150ML-% IV SOLN
750.0000 mg | Freq: Two times a day (BID) | INTRAVENOUS | Status: DC
  Administered 2013-08-20: 750 mg via INTRAVENOUS
  Filled 2013-08-19 (×2): qty 150

## 2013-08-19 MED ORDER — SODIUM CHLORIDE 0.9 % IV SOLN: 250.0000 mL | INTRAVENOUS | Status: DC | PRN

## 2013-08-19 MED ORDER — VANCOMYCIN HCL IN DEXTROSE 1-5 GM/200ML-% IV SOLN
1000.0000 mg | Freq: Once | INTRAVENOUS | Status: AC
  Administered 2013-08-19: 1000 mg via INTRAVENOUS
  Filled 2013-08-19: qty 200

## 2013-08-19 MED ORDER — SODIUM CHLORIDE 0.9 % IV SOLN: 1000.0000 mL | INTRAVENOUS | Status: DC

## 2013-08-19 MED ORDER — INSULIN ASPART 100 UNIT/ML ~~LOC~~ SOLN
0.0000 [IU] | SUBCUTANEOUS | Status: DC
  Administered 2013-08-19: 5 [IU] via SUBCUTANEOUS
  Administered 2013-08-19 (×2): 2 [IU] via SUBCUTANEOUS
  Administered 2013-08-20 (×2): 3 [IU] via SUBCUTANEOUS
  Administered 2013-08-20 (×2): 2 [IU] via SUBCUTANEOUS

## 2013-08-19 MED ORDER — SODIUM CHLORIDE 0.9 % IV SOLN: 1000.0000 mL | Freq: Once | INTRAVENOUS | Status: DC

## 2013-08-19 MED ORDER — SODIUM CHLORIDE 0.9 % IV SOLN
INTRAVENOUS | Status: DC
  Administered 2013-08-19: 14:00:00 via INTRAVENOUS
  Administered 2013-08-22: 20 mL/h via INTRAVENOUS

## 2013-08-19 MED ORDER — MIDAZOLAM HCL 2 MG/2ML IJ SOLN
INTRAMUSCULAR | Status: AC
  Administered 2013-08-19: 2 mg via INTRAVENOUS
  Filled 2013-08-19: qty 2

## 2013-08-19 MED ORDER — HEPARIN SODIUM (PORCINE) 5000 UNIT/ML IJ SOLN
5000.0000 [IU] | Freq: Three times a day (TID) | INTRAMUSCULAR | Status: DC
  Administered 2013-08-19 – 2013-08-20 (×3): 5000 [IU] via SUBCUTANEOUS
  Filled 2013-08-19 (×6): qty 1

## 2013-08-19 MED ORDER — DEXTROSE 5 % IV SOLN
1.0000 g | Freq: Three times a day (TID) | INTRAVENOUS | Status: DC
  Administered 2013-08-19 – 2013-08-21 (×5): 1 g via INTRAVENOUS
  Filled 2013-08-19 (×7): qty 1

## 2013-08-19 MED ORDER — FENTANYL CITRATE 0.05 MG/ML IJ SOLN: 50.0000 ug | Freq: Once | INTRAMUSCULAR | Status: AC

## 2013-08-19 MED ORDER — FUROSEMIDE 10 MG/ML IJ SOLN
40.0000 mg | Freq: Once | INTRAMUSCULAR | Status: AC
  Administered 2013-08-19: 40 mg via INTRAVENOUS
  Filled 2013-08-19: qty 4

## 2013-08-19 MED ORDER — FENTANYL CITRATE 0.05 MG/ML IJ SOLN
INTRAMUSCULAR | Status: AC
  Administered 2013-08-19: 50 ug via INTRAVENOUS
  Filled 2013-08-19: qty 2

## 2013-08-19 NOTE — ED Notes (Signed)
ems reports patient called ems and upon FD arrival patient sob but able to answer short questions, upon ems arrival patient unsponsive and had ventilations assisted, patient nasally intubated pta, ventilation with bvm upon arrival,

## 2013-08-19 NOTE — Consult Note (Signed)
PCP: Dr. Elias Else Primary cardiologist remotely: Dr. Peter Swaziland Subjective:  This 62 year old woman was admitted because of respiratory distress.  She has currently unknown past medical history.  She was seen by EMS at her house where they had been called because of her respiratory distress.  The patient was tachypneic and her oxygen saturations were initially in the 70s and she was hypotensive and tachycardic.  She failed to respond to CPAP with albuterol and subsequently was intubated nasally without medication. She is currently intubated although awake.  Detailed history cannot be obtained and no family members are currently available.  Old records faxed over from Dr. Nicholos Johns indicate that she has a history of a non-ST myocardial infarction in 2012 and was followed by Dr. Swaziland.  There is also an indication of a diagnosis of dilated ischemic cardiomyopathy with an ejection fraction of 30% and that she has an ICD in place although we do not know what ICD she has.  There is no indication in Epic that she has had any sort of cardiology followup by Dr. Swaziland within the past several years.  There are no outpatient entries for Epic.  As noted she is currently intubated.  She is awake and indicates that she has not had any recent chest pain.  Her initial EKG taken at 0956 today shows supraventricular tachycardia with anterolateral ST segment depression consistent with ischemia.  Her heart rate at that time was 151.  Subsequently her pulse has come down to 110 on telemetry, and repeat EKG is pending.  Her chest x-ray shows moderate cardiomegaly and changes suggestive of pulmonary vascular congestion.  Pneumonia cannot be excluded.  She has a single lead ICD with lead into the apex of the right ventricle.  Objective:  Vital Signs in the last 24 hours: Temp:  [98.9 F (37.2 C)-100.7 F (38.2 C)] 100.1 F (37.8 C) (08/20 1530) Pulse Rate:  [105-144] 110 (08/20 1607) Resp:  [13-30] 20 (08/20 1607) BP:  (101-175)/(64-104) 131/81 mmHg (08/20 1530) SpO2:  [92 %-100 %] 99 % (08/20 1616) FiO2 (%):  [50 %-100 %] 50 % (08/20 1616) Weight:  [202 lb 2.6 oz (91.7 kg)] 202 lb 2.6 oz (91.7 kg) (08/20 1014)  Intake/Output from previous day:   Intake/Output from this shift: Total I/O In: 227.7 [I.V.:27.7; IV Piggyback:200] Out: 1600 [Urine:1600]  . ipratropium  0.5 mg Nebulization Q6H   And  . albuterol  2.5 mg Nebulization Q6H  . antiseptic oral rinse  15 mL Mouth Rinse QID  . ceFEPime (MAXIPIME) IV  1 g Intravenous Q8H  . chlorhexidine  15 mL Mouth Rinse BID  . furosemide  40 mg Intravenous Q8H  . heparin  5,000 Units Subcutaneous Q8H  . insulin aspart  0-15 Units Subcutaneous Q4H  . insulin glargine  5 Units Subcutaneous Daily  . [START ON 08/20/2013] vancomycin  750 mg Intravenous Q12H   . sodium chloride 20 mL/hr at 08/19/13 1337    Physical Exam: The patient appears to be in no distress, intubated and alert.  Head and neck exam reveals that the pupils are equal and reactive.  The extraocular movements are full.  There is no scleral icterus.  Mouth and pharynx are benign.  No lymphadenopathy.  No carotid bruits.  The jugular venous pressure is normal.  Thyroid is not enlarged or tender.  Chest  reveals marked rales and rhonchi bilaterally   Heart reveals no abnormal lift or heave.  First and second heart sounds are normal.  There  is no murmur gallop rub or click. ICD pocket in left upper chest is well healed  The abdomen is soft and nontender.  Bowel sounds are normoactive.  There is no hepatosplenomegaly or mass.  There are no abdominal bruits.  Extremities reveal no phlebitis or edema.  Pedal pulses are good.  There is no cyanosis or clubbing.  Neurologic exam is normal strength and no lateralizing weakness.  No sensory deficits.  Integument reveals no rash  Lab Results:  Recent Labs  08/19/13 1014 08/19/13 1043  WBC 11.6*  --   HGB 13.9 15.6*  PLT 183  --     Recent  Labs  08/19/13 1014 08/19/13 1043  NA 139 142  K 4.8 4.4  CL 100 105  CO2 20  --   GLUCOSE 369* 368*  BUN 15 18  CREATININE 1.03 1.20*    Recent Labs  08/19/13 1300  TROPONINI <0.30   Hepatic Function Panel  Recent Labs  08/19/13 1014  PROT 7.1  ALBUMIN 3.8  AST 39*  ALT 18  ALKPHOS 60  BILITOT 0.4   No results found for this basename: CHOL,  in the last 72 hours No results found for this basename: PROTIME,  in the last 72 hours  Imaging: Dg Chest Port 1 View  08/19/2013   *RADIOLOGY REPORT*  Clinical Data: Central line placement.  PORTABLE CHEST - 1 VIEW  Comparison: 08/19/2013.  Findings: AICD device unchanged and stable.  Cardiomegaly with mild vascular congestion.  ET tube unchanged. Prior cervical fusion.  A right central venous catheter has been placed without pneumothorax.  Tip lies 1-2 cm below the cavoatrial junction. Repeat radiograph after slight repositioning is recommended.  IMPRESSION: Central venous catheter tip slightly low within the right atrium. Recommend withdrawal 1-2 cm and repeat radiograph.   Original Report Authenticated By: Davonna Belling, M.D.   Dg Chest Portable 1 View  08/19/2013   *RADIOLOGY REPORT*  Clinical Data: Respiratory stress  PORTABLE CHEST - 1 VIEW  Comparison: None.  Findings: The cardiac shadow is mildly enlarged.  A defibrillator is noted.  An endotracheal tube is seen approximately 4 cm above the carina.  Central vascular congestion is noted.  Some very mild interstitial edema is seen.  IMPRESSION: Mild CHF.   Original Report Authenticated By: Alcide Clever, M.D.    Cardiac Studies: Two-dimensional echocardiogram 08/19/13 : - Left ventricle: The cavity size was normal. Wall thickness was increased in a pattern of mild LVH. The estimated ejection fraction was 20%. Diffuse hypokinesis. There is akinesis of the mid-distalanteroseptal myocardium. The study is not technically sufficient to allow evaluation of LV diastolic function. -  Mitral valve: Mild regurgitation. - Pulmonary arteries: PA peak pressure: 35mm Hg (S).  Assessment/Plan:  1.severe left ventricular systolic dysfunction with ejection fraction 20% by echo today and past history of ischemic cardiomyopathy as per outpatient records from her PCP.  We will need to obtain old cardiac catheterization records from 2012. 2. probable acute on chronic systolic congestive heart failure.  Not clear at this time within her left ventricular function secondary to ischemic or nonischemic cardiomyopathy.  Her troponin levels are normal 3. ICD in place 4. diabetes mellitus 5. outpatient records also indicate a diagnosis of primary pulmonary hypertension.  The basis for this diagnosis is not currently available. 6. probable pneumonia 7.  Hyperlipidemia by history 8. history of asthma  Plan: At this time the patient is responding to IV diuretics with Lasix.  She is hemodynamically stable with good  blood pressure.  Rhythm is sinus tachycardia at 110 per minute.  Continue current therapy and consider restarting low-dose beta blocker in the a.m. Also as blood pressure allows we will want to restart her ACE inhibitor.  We will update her lipid panel and subsequently restart statin therapy.  Her outpatient records indicate that she was on Plavix but it is not clear whether this is for cardiac reasons or because of a past history of stroke. Hopefully much of her history can be clarified once she is extubated and able to give a history.  We will also try for interrogation of her ICD tomorrow. Will follow with you    LOS: 0 days    Cassell Clement 08/19/2013, 5:22 PM

## 2013-08-19 NOTE — Progress Notes (Signed)
ANTIBIOTIC CONSULT NOTE - INITIAL  Pharmacy Consult for Cefepime and Vancomycin Indication: sepsis  Allergies not on file  Patient Measurements: Height: 5\' 5"  (165.1 cm) Weight: 202 lb 2.6 oz (91.7 kg) IBW/kg (Calculated) : 57  Vital Signs: Temp: 100.7 F (38.2 C) (08/20 1004) Temp src: Rectal (08/20 1004) BP: 124/78 mmHg (08/20 1115) Pulse Rate: 114 (08/20 1115) Intake/Output from previous day:   Intake/Output from this shift:    Labs:  Recent Labs  08/19/13 1014 08/19/13 1043  WBC 11.6*  --   HGB 13.9 15.6*  PLT 183  --   CREATININE 1.03 1.20*   Estimated Creatinine Clearance: 54.4 ml/min (by C-G formula based on Cr of 1.2). No results found for this basename: VANCOTROUGH, VANCOPEAK, VANCORANDOM, GENTTROUGH, GENTPEAK, GENTRANDOM, TOBRATROUGH, TOBRAPEAK, TOBRARND, AMIKACINPEAK, AMIKACINTROU, AMIKACIN,  in the last 72 hours   Microbiology: No results found for this or any previous visit (from the past 720 hour(s)).  Medical History: No past medical history on file.  Medications:  Await electronic home med rec  Assessment: 62 y.o. female presented from home with respiratory distress and AMS. Required intubation by EMS. PMH unknown. To begin broad spectrum antibiotics for sepsis. Tm 100.7. Wbc 11.6. Noted SCr 1.2, est CrCl ~55 ml/min. Pt received Cefepime 1gm in ED 1120, order for Vanc 1gm to be hung after Cefepime. Cx pending.  Goal of Therapy:  Vancomycin trough level 15-20 mcg/ml  Plan:  1) Cefepime 1gm IV q8h 2) Vancomycin 1gm now then 750mg  IV q12h 3) Will f/u renal function, micro data, and allergy information when available  Christoper Fabian, PharmD, BCPS Clinical pharmacist, pager [phone] 08/19/2013,11:32 AM

## 2013-08-19 NOTE — Procedures (Signed)
PROCEDURE NOTE: CVL PLACEMENT  INDICATION:    Monitoring of central venous pressures and/or administration of medications optimally administered in central vein  CONSENT:   Risks of procedure as well as the alternatives were explained to the patient or surrogate. Consent for procedure obtained. A time out was performed to review patient identification, procedure to be performed, correct patient position, medications/allergies/relevent history, required imaging and test results.  PROCEDURE  Maximum sterile technique was used including antiseptics, cap, gloves, gown, hand hygiene, mask and sheet.  Skin prep: Chlorhexidine; local anesthetic administered  A antimicrobial bonded/coated triple lumen catheter was placed in the R [place] vein using the Seldinger technique.  Ultrasound was used for vessel identification and guidance.   EVALUATION:  Blood flow good  Complications: No apparent complications  Patient tolerated the procedure well.  Chest X-ray ordered to verify placement and is pending   Hanaa Payes, MD PCCM service Mobile [phone]    

## 2013-08-19 NOTE — ED Provider Notes (Signed)
CSN: [id number]     Arrival date & time 08/19/13  6301 History     First MD Initiated Contact with Patient 08/19/13 1043     Chief Complaint  Patient presents with  . Altered Mental Status    unresponsive   (Consider location/radiation/quality/duration/timing/severity/associated sxs/prior Treatment) HPI This is a 62 year old female with unknown past medical history who presents by EMS. The patient is currently intubated and unable to contribute to history. Per EMS, they were called to her house for respiratory distress. They found the patient tachypnic and altered.  Her initial O2 saturations were in the 70s. She was also noted to be hypotensive and tachycardic. EMS initiated CPAP with albuterol initially. The patient continued to decline and was intubated nasally. She was intubated without medications.  Past medical, surgical, sociaL and family history unobtainable secondary to patient mental status Past Medical History  Diagnosis Date  . CHF (congestive heart failure)   . CKD (chronic kidney disease) stage 3, GFR 30-59 ml/min   . DM (diabetes mellitus)   . CVA (cerebral infarction)   . HTN (hypertension)   . Primary pulmonary HTN    No past surgical history on file. No family history on file. History  Substance Use Topics  . Smoking status: Former Games developer  . Smokeless tobacco: Not on file  . Alcohol Use: Not on file   OB History   Grav Para Term Preterm Abortions TAB SAB Ect Mult Living                 Review of Systems  Unable to perform ROS: Intubated    Allergies  Review of patient's allergies indicates not on file.  Home Medications  No current outpatient prescriptions on file. BP 124/78  Pulse 114  Temp(Src) 100.7 F (38.2 C) (Rectal)  Resp 21  Ht 5\' 5"  (1.651 m)  Wt 202 lb 2.6 oz (91.7 kg)  BMI 33.64 kg/m2  SpO2 94% Physical Exam  Nursing note and vitals reviewed. Constitutional:  Intubated, moves all 4 extremities, obese  HENT:  Head: Normocephalic  and atraumatic.  Eyes: Pupils are equal, round, and reactive to light.  Neck: Normal range of motion. Neck supple. JVD present.  Cardiovascular: Regular rhythm and intact distal pulses.  Tachycardia present.   Pulmonary/Chest:  Coarse bilateral breath sounds, equal air entry. Crackles noted at the base.  Intubated  Abdominal: Soft. Bowel sounds are normal. There is no tenderness.  Musculoskeletal: She exhibits edema.  Neurological:  Moves all 4 extremities. Not following commands.  Skin: Skin is warm and dry.    ED Course   Medications  0.9 %  sodium chloride infusion (not administered)    Followed by  0.9 %  sodium chloride infusion (not administered)  vancomycin (VANCOCIN) IVPB 1000 mg/200 mL premix (1,000 mg Intravenous New Bag/Given 08/19/13 1200)  aspirin chewable tablet 324 mg (not administered)    Or  aspirin suppository 300 mg (not administered)  0.9 %  sodium chloride infusion (not administered)  heparin injection 5,000 Units (not administered)  0.9 %  sodium chloride infusion (not administered)  midazolam (VERSED) injection 2-4 mg (not administered)  fentaNYL (SUBLIMAZE) injection 50-100 mcg (not administered)  albuterol (PROVENTIL) (5 MG/ML) 0.5% nebulizer solution 2.5 mg (not administered)    And  ipratropium (ATROVENT) nebulizer solution 0.5 mg (not administered)  ceFEPIme (MAXIPIME) 1 g in dextrose 5 % 50 mL IVPB (not administered)  vancomycin (VANCOCIN) IVPB 750 mg/150 ml premix (not administered)  fentaNYL (SUBLIMAZE) injection 50 mcg (  50 mcg Intravenous Given 08/19/13 1015)  midazolam (VERSED) injection 2 mg (2 mg Intravenous Given 08/19/13 1030)  ceFEPIme (MAXIPIME) 2 g in dextrose 5 % 50 mL IVPB (0 g Intravenous Stopped 08/19/13 1200)  furosemide (LASIX) injection 40 mg (40 mg Intravenous Given 08/19/13 1129)   Procedures (including critical care time)  Labs Reviewed  CBC WITH DIFFERENTIAL - Abnormal; Notable for the following:    WBC 11.6 (*)    RBC 5.90 (*)     MCV 70.2 (*)    MCH 23.6 (*)    RDW 16.8 (*)    Lymphs Abs 4.2 (*)    All other components within normal limits  COMPREHENSIVE METABOLIC PANEL - Abnormal; Notable for the following:    Glucose, Bld 369 (*)    AST 39 (*)    GFR calc non Af Amer 57 (*)    GFR calc Af Amer 66 (*)    All other components within normal limits  URINALYSIS, ROUTINE W REFLEX MICROSCOPIC - Abnormal; Notable for the following:    Glucose, UA 500 (*)    Protein, ur 100 (*)    All other components within normal limits  LACTIC ACID, PLASMA - Abnormal; Notable for the following:    Lactic Acid, Venous 2.8 (*)    All other components within normal limits  POCT I-STAT 3, BLOOD GAS (G3+) - Abnormal; Notable for the following:    pH, Arterial 7.198 (*)    pCO2 arterial 59.0 (*)    pO2, Arterial 104.0 (*)    Acid-base deficit 6.0 (*)    All other components within normal limits  POCT I-STAT, CHEM 8 - Abnormal; Notable for the following:    Creatinine, Ser 1.20 (*)    Glucose, Bld 368 (*)    Hemoglobin 15.6 (*)    All other components within normal limits  CG4 I-STAT (LACTIC ACID) - Abnormal; Notable for the following:    Lactic Acid, Venous 7.27 (*)    All other components within normal limits  CULTURE, BLOOD (ROUTINE X 2)  CULTURE, BLOOD (ROUTINE X 2)  URINE CULTURE  MRSA PCR SCREENING  PROTIME-INR  APTT  URINE MICROSCOPIC-ADD ON  PRO B NATRIURETIC PEPTIDE  STREP PNEUMONIAE URINARY ANTIGEN  LEGIONELLA ANTIGEN, URINE  PROCALCITONIN  LACTIC ACID, PLASMA  URINALYSIS, ROUTINE W REFLEX MICROSCOPIC  POCT I-STAT TROPONIN I   Dg Chest Portable 1 View  08/19/2013   *RADIOLOGY REPORT*  Clinical Data: Respiratory stress  PORTABLE CHEST - 1 VIEW  Comparison: None.  Findings: The cardiac shadow is mildly enlarged.  A defibrillator is noted.  An endotracheal tube is seen approximately 4 cm above the carina.  Central vascular congestion is noted.  Some very mild interstitial edema is seen.  IMPRESSION: Mild CHF.    Original Report Authenticated By: Alcide Clever, M.D.   1. Acute respiratory failure   2. Lactic acidosis   3. Altered mental status   4. Pulmonary edema   5. Fever     MDM  This is a 62 year old female who presents by EMS intubated following respiratory distress. Vital signs are notable for tachycardia and hypertension. She was also noted to have a fever of 100.7. Sepsis workup was initiated. Chest x-ray shows appropriate placement. It also shows pulmonary edema. Chem-8 shows AKI and a lactic acidosis. Fluids were begun.  Broad-spectrum antibiotics were initiated. Patient was given fentanyl and Versed for sedation.  At this time I'm unsure whether her acute respiratory failure secondary to pneumonia with sepsis  versus CHF. Patient will be admitted to the ICU for further management and care.  CRITICAL CARE Performed by: Ross Marcus, F   Total critical care time: 30  Critical care time was exclusive of separately billable procedures and treating other patients.  Critical care was necessary to treat or prevent imminent or life-threatening deterioration.  Critical care was time spent personally by me on the following activities: development of treatment plan with patient and/or surrogate as well as nursing, discussions with consultants, evaluation of patient's response to treatment, examination of patient, obtaining history from patient or surrogate, ordering and performing treatments and interventions, ordering and review of laboratory studies, ordering and review of radiographic studies, pulse oximetry and re-evaluation of patient's condition.   Shon Baton, MD 08/19/13 1255

## 2013-08-19 NOTE — H&P (Addendum)
PULMONARY  / CRITICAL CARE MEDICINE  Name: Andrea [name] MRN: [id number] DOB: 06/03/1951    ADMISSION DATE:  08/19/2013  REFERRING MD :  EDP PRIMARY SERVICE: PCCM  CHIEF COMPLAINT:  SOB  BRIEF PATIENT DESCRIPTION: 62 yo female with hx CHF (other PMH unknown) presented 8/20 with SOB and AMS requiring (nasal) intubation by EMS.  In ER pt hypoxic, CXR c/w CHF.  PCCM called to admit.  Cath 6/12 - severe LV dysfunction EF 25%, non obstructive CAD PMH -includes primary pulm htn - needs clarification  SIGNIFICANT EVENTS / STUDIES:  2D echo 8/20>>>  LINES / TUBES: ETT 8/20>>>  CULTURES: Urine 8/20>>> BCx2 8/20>>> Sputum 8/20>>>  ANTIBIOTICS: Vancomycin 8/20>>> Cefepime 8/20>>>  HISTORY OF PRESENT ILLNESS:  62 yo female with unknown PMH besides CHF, called 911 this am r/t SOB.  When firemen arrived, she was very SOB with sats 70's.  She deteriorated quickly, became unresponsive and required nasal intubation (intubated with no meds).  Pt now alert, appears oriented in ER on vent.  Shakes head yes/no appropriately.   States she became acutely SOB this am. Felt like her normal self yesterday. Denies cough, fevers, chest pain, purulent sputum, urinary symptoms, BLE edema, orthopnea, leg/calf pain, recent travel or sick contacts.   PAST MEDICAL HISTORY :  Past Medical History  Diagnosis Date  . CHF (congestive heart failure)   . CKD (chronic kidney disease) stage 3, GFR 30-59 ml/min   . DM (diabetes mellitus)   . CVA (cerebral infarction)   . HTN (hypertension)   . Primary pulmonary HTN    No past surgical history on file. Prior to Admission medications   Not on File   Allergies not on file  FAMILY HISTORY:  No family history on file. SOCIAL HISTORY:  reports that she has quit smoking. She does not have any smokeless tobacco history on file. Her alcohol and drug histories are not on file.  REVIEW OF SYSTEMS:  As per HPI - all other systems reviewed and were neg.   VITAL  SIGNS: Temp:  [100.7 F (38.2 C)] 100.7 F (38.2 C) (08/20 1004) Pulse Rate:  [114-144] 114 (08/20 1115) Resp:  [21-30] 21 (08/20 1115) BP: (124-175)/(78-104) 124/78 mmHg (08/20 1115) SpO2:  [93 %-100 %] 94 % (08/20 1115) FiO2 (%):  [60 %-100 %] 80 % (08/20 1125) Weight:  [202 lb 2.6 oz (91.7 kg)] 202 lb 2.6 oz (91.7 kg) (08/20 1014) HEMODYNAMICS:   VENTILATOR SETTINGS: Vent Mode:  [-] PRVC FiO2 (%):  [60 %-100 %] 80 % Set Rate:  [14 bmp-20 bmp] 20 bmp Vt Set:  [500 mL] 500 mL PEEP:  [5 cmH20-8 cmH20] 8 cmH20 Plateau Pressure:  [26 cmH20-28 cmH20] 26 cmH20 INTAKE / OUTPUT: Intake/Output   None     PHYSICAL EXAMINATION: General:  Overweight female, NAD on vent in ER Neuro:  Awake, alert, MAE, follows commands, nods head appropriately HEENT:  Mm moist, no JVD, no LA Cardiovascular:  s1s2 rrr, mild tachy Lungs:  resps even, non labored on vent, coarse Abdomen:  Obese, soft, +bs Musculoskeletal:  Warm and dry, no edema   LABS:  CBC Recent Labs     08/19/13  1014  08/19/13  1043  WBC  11.6*   --   HGB  13.9  15.6*  HCT  41.4  46.0  PLT  183   --    Coag's Recent Labs     08/19/13  1014  APTT  25  INR  1.07  BMET Recent Labs     08/19/13  1014  08/19/13  1043  NA  139  142  K  4.8  4.4  CL  100  105  CO2  20   --   BUN  15  18  CREATININE  1.03  1.20*  GLUCOSE  369*  368*   Electrolytes Recent Labs     08/19/13  1014  CALCIUM  9.1   Sepsis Markers No results found for this basename: LACTICACIDVEN, PROCALCITON, O2SATVEN,  in the last 72 hours ABG Recent Labs     08/19/13  1035  PHART  7.198*  PCO2ART  59.0*  PO2ART  104.0*   Liver Enzymes Recent Labs     08/19/13  1014  AST  39*  ALT  18  ALKPHOS  60  BILITOT  0.4  ALBUMIN  3.8   Cardiac Enzymes No results found for this basename: TROPONINI, PROBNP,  in the last 72 hours Glucose No results found for this basename: GLUCAP,  in the last 72 hours  Imaging Dg Chest Portable 1  View  08/19/2013   *RADIOLOGY REPORT*  Clinical Data: Respiratory stress  PORTABLE CHEST - 1 VIEW  Comparison: None.  Findings: The cardiac shadow is mildly enlarged.  A defibrillator is noted.  An endotracheal tube is seen approximately 4 cm above the carina.  Central vascular congestion is noted.  Some very mild interstitial edema is seen.  IMPRESSION: Mild CHF.   Original Report Authenticated By: Alcide Clever, M.D.     ASSESSMENT / PLAN:  PULMONARY Acute hypoxic respiratory failure - presumed r/t CHF.  Requiring mod vent support with peep 8, FiO2 80%. Nasally intubated.  Former smoker - no known hx COPD ?hx pulm HTN P:   -Vent support - Vt500, rate 20, peep 8, 80% -F/u ABG  -Diuresis  -F/u CXR  -Wean FiO2 as able -BD -Hold off on changing ETT -hopeful that with diuresis she will improve quickly and be extubateable in next ~24 hours, if not will need to change to OETT  CARDIOVASCULAR Presumed acute on chronic CHF Tachycardia - mild  EKG - paced rhythm @ 151 P:  -2D echo  -BNP pending  -Diuresis as BP tol  -Consider cards input - [facility], cards listed as [place] - will attempt to get records  RENAL Acute on chronic renal insufficiency - unknown baseline Scr but stage III CKD at baseline  AG metab acidosis/ Lactic acidosis - lactate >7 - unknown etiology as hx does not support infectious process.  ?r/t severe resp distress - resolution good prognostic sign P:    -F/u chem  -F/u ABG  -KVO IV fluids  -Monitor Scr with diuresis   GASTROINTESTINAL No active issue  P:   -PPI  -NPO - hold TF for now   HEMATOLOGIC Mild leukocytosis  P:  -F/u cbc  -Heparin SQ for DVT proph  INFECTIOUS Mild leukocytosis  No clear infectious source , doubt Pna P:   -Empiric abx  -Pan culture  -Check pct , dc abx soon if low & cx neg  ENDOCRINE DM P:   -SSI  NEUROLOGIC AMS - in setting respiratory failure --Much improved.  P:   -intermittent sedation while on vent      WHITEHEART,KATHRYN, NP 08/19/2013  11:55 AM Pager: [phone] or [phone]  I have personally obtained a history, examined the patient, evaluated laboratory and imaging results, formulated the assessment and plan and placed orders. CRITICAL CARE: The patient  is critically ill with multiple organ systems failure and requires high complexity decision making for assessment and support, frequent evaluation and titration of therapies, application of advanced monitoring technologies and extensive interpretation of multiple databases. Critical Care Time devoted to patient care services described in this note is 60 minutes.   *Care during the described time interval was provided by me and/or other providers on the critical care team. I have reviewed this patient's available data, including medical history, events of note, physical examination and test results as part of my evaluation.   ALVA,RAKESH V.

## 2013-08-19 NOTE — Progress Notes (Signed)
Echocardiogram 2D Echocardiogram has been performed.  Andrea [name] 08/19/2013, 4:04 PM

## 2013-08-19 NOTE — Progress Notes (Signed)
Utilization Review Completed.Kaysen Sefcik T8/20/2014  

## 2013-08-20 ENCOUNTER — Inpatient Hospital Stay (HOSPITAL_COMMUNITY): Payer: BC Managed Care – PPO

## 2013-08-20 ENCOUNTER — Encounter (HOSPITAL_COMMUNITY): Payer: Self-pay | Admitting: *Deleted

## 2013-08-20 DIAGNOSIS — R509 Fever, unspecified: Secondary | ICD-10-CM

## 2013-08-20 DIAGNOSIS — R7881 Bacteremia: Secondary | ICD-10-CM | POA: Diagnosis present

## 2013-08-20 DIAGNOSIS — I429 Cardiomyopathy, unspecified: Secondary | ICD-10-CM | POA: Diagnosis present

## 2013-08-20 DIAGNOSIS — J189 Pneumonia, unspecified organism: Secondary | ICD-10-CM

## 2013-08-20 DIAGNOSIS — I428 Other cardiomyopathies: Secondary | ICD-10-CM

## 2013-08-20 DIAGNOSIS — E119 Type 2 diabetes mellitus without complications: Secondary | ICD-10-CM | POA: Diagnosis present

## 2013-08-20 LAB — GLUCOSE, CAPILLARY
Glucose-Capillary: 159 mg/dL — ABNORMAL HIGH (ref 70–99)
Glucose-Capillary: 139 mg/dL — ABNORMAL HIGH (ref 70–99)
Glucose-Capillary: 128 mg/dL — ABNORMAL HIGH (ref 70–99)
Glucose-Capillary: 177 mg/dL — ABNORMAL HIGH (ref 70–99)

## 2013-08-20 LAB — BLOOD GAS, ARTERIAL
Bicarbonate: 25.6 mEq/L — ABNORMAL HIGH (ref 20.0–24.0)
PEEP: 5 cmH2O
Patient temperature: 101.7
pCO2 arterial: 39.1 mmHg (ref 35.0–45.0)
pH, Arterial: 7.441 (ref 7.350–7.450)
pO2, Arterial: 136 mmHg — ABNORMAL HIGH (ref 80.0–100.0)

## 2013-08-20 LAB — URINE CULTURE
Colony Count: NO GROWTH
Culture: NO GROWTH

## 2013-08-20 LAB — CBC
HCT: 40 % (ref 36.0–46.0)
Hemoglobin: 13.8 g/dL (ref 12.0–15.0)
MCH: 23.4 pg — ABNORMAL LOW (ref 26.0–34.0)
MCHC: 34.5 g/dL (ref 30.0–36.0)

## 2013-08-20 LAB — BASIC METABOLIC PANEL
BUN: 17 mg/dL (ref 6–23)
Calcium: 9.5 mg/dL (ref 8.4–10.5)
Chloride: 99 mEq/L (ref 96–112)
Creatinine, Ser: 0.87 mg/dL (ref 0.50–1.10)
Creatinine, Ser: 0.94 mg/dL (ref 0.50–1.10)
GFR calc Af Amer: 81 mL/min — ABNORMAL LOW (ref >90)
GFR calc non Af Amer: 64 mL/min — ABNORMAL LOW (ref >90)
Glucose, Bld: 151 mg/dL — ABNORMAL HIGH (ref 70–99)
Potassium: 3.4 mEq/L — ABNORMAL LOW (ref 3.5–5.1)

## 2013-08-20 LAB — LEGIONELLA ANTIGEN, URINE

## 2013-08-20 LAB — LIPID PANEL: HDL: 53 mg/dL (ref >39)

## 2013-08-20 LAB — PROCALCITONIN: Procalcitonin: 10.62 ng/mL

## 2013-08-20 MED ORDER — INSULIN GLARGINE 100 UNIT/ML ~~LOC~~ SOLN
5.0000 [IU] | Freq: Every day | SUBCUTANEOUS | Status: DC
  Filled 2013-08-20: qty 0.05

## 2013-08-20 MED ORDER — CARVEDILOL 6.25 MG PO TABS
6.2500 mg | ORAL_TABLET | Freq: Two times a day (BID) | ORAL | Status: DC
  Administered 2013-08-20 – 2013-08-21 (×2): 6.25 mg via ORAL
  Filled 2013-08-20 (×5): qty 1

## 2013-08-20 MED ORDER — METOPROLOL TARTRATE 1 MG/ML IV SOLN
2.5000 mg | INTRAVENOUS | Status: DC | PRN
  Administered 2013-08-20: 2.5 mg via INTRAVENOUS
  Filled 2013-08-20: qty 5

## 2013-08-20 MED ORDER — DULOXETINE HCL 60 MG PO CPEP
60.0000 mg | ORAL_CAPSULE | Freq: Every day | ORAL | Status: DC
  Administered 2013-08-20 – 2013-08-25 (×6): 60 mg via ORAL
  Filled 2013-08-20 (×6): qty 1

## 2013-08-20 MED ORDER — PANTOPRAZOLE SODIUM 40 MG IV SOLR
40.0000 mg | Freq: Every day | INTRAVENOUS | Status: DC
  Administered 2013-08-20: 40 mg via INTRAVENOUS
  Filled 2013-08-20 (×2): qty 40

## 2013-08-20 MED ORDER — POTASSIUM CHLORIDE 10 MEQ/50ML IV SOLN
10.0000 meq | INTRAVENOUS | Status: AC
  Administered 2013-08-20 (×4): 10 meq via INTRAVENOUS
  Filled 2013-08-20 (×3): qty 50

## 2013-08-20 MED ORDER — INSULIN ASPART 100 UNIT/ML ~~LOC~~ SOLN
0.0000 [IU] | Freq: Three times a day (TID) | SUBCUTANEOUS | Status: DC
  Administered 2013-08-20 – 2013-08-21 (×2): 2 [IU] via SUBCUTANEOUS
  Administered 2013-08-21 – 2013-08-22 (×2): 1 [IU] via SUBCUTANEOUS
  Administered 2013-08-23: 2 [IU] via SUBCUTANEOUS
  Administered 2013-08-24: 1 [IU] via SUBCUTANEOUS
  Administered 2013-08-24: 2 [IU] via SUBCUTANEOUS

## 2013-08-20 MED ORDER — VANCOMYCIN HCL IN DEXTROSE 1-5 GM/200ML-% IV SOLN
1000.0000 mg | Freq: Two times a day (BID) | INTRAVENOUS | Status: DC
  Administered 2013-08-20 – 2013-08-22 (×4): 1000 mg via INTRAVENOUS
  Filled 2013-08-20 (×5): qty 200

## 2013-08-20 MED ORDER — ATORVASTATIN CALCIUM 40 MG PO TABS
40.0000 mg | ORAL_TABLET | Freq: Every day | ORAL | Status: DC
  Administered 2013-08-20 – 2013-08-25 (×6): 40 mg via ORAL
  Filled 2013-08-20 (×6): qty 1

## 2013-08-20 MED ORDER — ACETAMINOPHEN 325 MG PO TABS
650.0000 mg | ORAL_TABLET | ORAL | Status: DC | PRN
  Administered 2013-08-21 – 2013-08-22 (×2): 650 mg via ORAL
  Filled 2013-08-20 (×2): qty 2

## 2013-08-20 MED ORDER — INSULIN ASPART 100 UNIT/ML ~~LOC~~ SOLN
3.0000 [IU] | Freq: Three times a day (TID) | SUBCUTANEOUS | Status: DC
  Administered 2013-08-20 – 2013-08-25 (×9): 3 [IU] via SUBCUTANEOUS

## 2013-08-20 MED ORDER — ENOXAPARIN SODIUM 40 MG/0.4ML ~~LOC~~ SOLN
40.0000 mg | SUBCUTANEOUS | Status: DC
  Administered 2013-08-20 – 2013-08-25 (×6): 40 mg via SUBCUTANEOUS
  Filled 2013-08-20 (×6): qty 0.4

## 2013-08-20 MED ORDER — POTASSIUM CHLORIDE 10 MEQ/50ML IV SOLN
INTRAVENOUS | Status: AC
  Filled 2013-08-20: qty 50

## 2013-08-20 MED ORDER — INSULIN ASPART 100 UNIT/ML ~~LOC~~ SOLN
0.0000 [IU] | Freq: Every day | SUBCUTANEOUS | Status: DC
  Administered 2013-08-23: 2 [IU] via SUBCUTANEOUS

## 2013-08-20 MED ORDER — CLOPIDOGREL BISULFATE 75 MG PO TABS
75.0000 mg | ORAL_TABLET | Freq: Every day | ORAL | Status: DC
  Filled 2013-08-20 (×2): qty 1

## 2013-08-20 MED ORDER — CLOPIDOGREL BISULFATE 75 MG PO TABS
75.0000 mg | ORAL_TABLET | Freq: Every day | ORAL | Status: DC
  Administered 2013-08-20 – 2013-08-25 (×6): 75 mg via ORAL
  Filled 2013-08-20 (×7): qty 1

## 2013-08-20 MED ORDER — ACETAMINOPHEN 650 MG RE SUPP
650.0000 mg | Freq: Four times a day (QID) | RECTAL | Status: DC | PRN
  Administered 2013-08-20: 650 mg via RECTAL
  Filled 2013-08-20: qty 1

## 2013-08-20 MED ORDER — FENTANYL CITRATE 0.05 MG/ML IJ SOLN: 12.5000 ug | INTRAMUSCULAR | Status: DC | PRN

## 2013-08-20 NOTE — Progress Notes (Signed)
PULMONARY  / CRITICAL CARE MEDICINE  Name: Nethra Mehlberg MRN: [id number] DOB: 1951/05/08    ADMISSION DATE:  08/19/2013  REFERRING MD :  EDP PRIMARY SERVICE: PCCM  CHIEF COMPLAINT:  SOB  BRIEF PATIENT DESCRIPTION: 62 yo female with hx CHF presented to [facility] ED 8/20 with SOB and AMS requiring (nasal) intubation by EMS.  In ER pt hypoxic, CXR c/w CHF.  PCCM called to admit.   SIGNIFICANT EVENTS / STUDIES:  8/20 Echo: The cavity size was normal. Wall thickness was increased in a pattern of mild LVH. The estimated ejection fraction was 20%. Diffuse hypokinesis  8/21 elevated PCT. Development of LLL consolidation. Positive BC. Working dx: CAP 8/21: Passed SBT. Cognition fully intact. Extubated  LINES / TUBES: Nasal ETT 8/20 >> 8/21 R Ovid CVL 8/20 >>    CULTURES: PCT 8/20-8/22 >> 4.49, 10.62,    Urine 8/20 >> NEG Resp 8/20 >>  BCx2 8/20 >> 1/2 GPC clusters >>    ANTIBIOTICS: Vancomycin 8/20 >>  Cefepime 8/20 >>   VITAL SIGNS: Temp:  [98.9 F (37.2 C)-101.8 F (38.8 C)] 99.9 F (37.7 C) (08/21 1100) Pulse Rate:  [101-125] 112 (08/21 1100) Resp:  [5-27] 26 (08/21 1100) BP: (101-154)/(5-93) 127/79 mmHg (08/21 1100) SpO2:  [92 %-100 %] 99 % (08/21 1100) FiO2 (%):  [40 %-60 %] 40 % (08/21 0800) Weight:  [87.4 kg (192 lb 10.9 oz)] 87.4 kg (192 lb 10.9 oz) (08/21 0500) HEMODYNAMICS:   VENTILATOR SETTINGS: Vent Mode:  [-] CPAP FiO2 (%):  [40 %-60 %] 40 % Set Rate:  [20 bmp] 20 bmp Vt Set:  [500 mL] 500 mL PEEP:  [5 cmH20] 5 cmH20 Pressure Support:  [10 cmH20] 10 cmH20 Plateau Pressure:  [16 cmH20-23 cmH20] 18 cmH20 INTAKE / OUTPUT: Intake/Output     08/20 0701 - 08/21 0700 08/21 0701 - 08/22 0700   I.V. (mL/kg) 347.7 (4) 20 (0.2)   IV Piggyback 450 100   Total Intake(mL/kg) 797.7 (9.1) 120 (1.4)   Urine (mL/kg/hr) 3275 400 (0.8)   Total Output 3275 400   Net -2477.3 -280          PHYSICAL EXAMINATION: General:  RASS 0. + F/C. No resp distress post  extubation Neuro:  MAEs, no focal deficits HEENT:  WNL Cardiovascular:  Tachy, reg, no M  Lungs:  Clear anteriorly without wheezes Abdomen:  Obese, soft, +bs Musculoskeletal:  Warm and dry, no edema   LABS:  CBC Recent Labs     08/19/13  1014  08/19/13  1043  08/20/13  0500  WBC  11.6*   --   17.3*  HGB  13.9  15.6*  13.8  HCT  41.4  46.0  40.0  PLT  183   --   168   Coag's Recent Labs     08/19/13  1014  APTT  25  INR  1.07   BMET Recent Labs     08/19/13  1630  08/19/13  2330  08/20/13  0500  NA  141  141  141  K  3.6  3.4*  3.5  CL  102  99  100  CO2  26  27  27   BUN  15  16  17   CREATININE  0.89  0.87  0.94  GLUCOSE  212*  158*  151*   Electrolytes Recent Labs     08/19/13  1630  08/19/13  2330  08/20/13  0500  CALCIUM  9.2  9.2  9.5  Sepsis Markers Recent Labs     08/19/13  1148  08/20/13  0500  PROCALCITON  4.49  10.62   ABG Recent Labs     08/19/13  1035  08/19/13  1559  08/20/13  0343  PHART  7.198*  7.365  7.441  PCO2ART  59.0*  44.5  39.1  PO2ART  104.0*  174.0*  136.0*   Liver Enzymes Recent Labs     08/19/13  1014  AST  39*  ALT  18  ALKPHOS  60  BILITOT  0.4  ALBUMIN  3.8   Cardiac Enzymes Recent Labs     08/19/13  1024  08/19/13  1300  08/19/13  2329  08/20/13  0058  TROPONINI   --   <0.30  <0.30  <0.30  PROBNP  1769.0*   --    --    --    Glucose Recent Labs     08/19/13  1539  08/19/13  1938  08/19/13  2352  08/20/13  0418  08/20/13  0749  08/20/13  1121  GLUCAP  213*  145*  153*  159*  139*  128*    CXR: CM, vasc congestion and IS prominence, LLL consolidation   ASSESSMENT / PLAN:  PULMONARY Acute respiratory failure  Pulm edema, mild  Community acquired PNA Former smoker - no known hx COPD P:   Monitor in ICU post extubation Supplemental O2 to maintain SpO2 > 93% Cont nebulized BDs Good candidate for NPPV if needed  CARDIOVASCULAR Severe nonischemic CM Reactive tachycardia P:   Monitor PRN metoprolol to maintain HR < 110/min Likely resume outpt meds 8/22  RENAL Acute renal insufficiency, resolved   AG metab acidosis/ Lactic acidosis, resolved P:   Monitor BMET intermittently Correct electrolytes as indicated   GASTROINTESTINAL No active issue  P:   Begin CHO mod diet if tolerates extubation   HEMATOLOGIC No issues  P:  Monitor CBC intermittently  INFECTIOUS Suspect LLL CAP Elevated PCT Positive BC - possible contaminant P:   Micro and abx as above Repeat CXR 8/22  ENDOCRINE DM II, controlled P:   Cont SSI - change to ACHS once diet started  NEUROLOGIC Acute encephalopathy, resolved  P:   Minimize sedation   CCM X 35 mins   Billy Fischer, MD ; [facility] service Mobile [phone].  After 5:30 PM or weekends, call [phone]

## 2013-08-20 NOTE — Progress Notes (Signed)
Patient extubated without difficulty to a [place] 4lpm. HR 122, O2 sat 99%, RR 23. Verbalizing and coughing well. Will continue to monitor

## 2013-08-20 NOTE — Progress Notes (Signed)
8/20 BC positive for gram + cocci in clusters, CCM notified

## 2013-08-20 NOTE — Progress Notes (Signed)
TELEMETRY: Reviewed telemetry pt in sinus tachy, rare PVC: Filed Vitals:   08/20/13 0500 08/20/13 0600 08/20/13 0700 08/20/13 0735  BP: 153/90 120/71 128/74 128/74  Pulse: 122 114 113 125  Temp: 101.8 F (38.8 C) 101.2 F (38.4 C) 100.8 F (38.2 C)   TempSrc:      Resp: 20 20 20 26   Height:      Weight: 192 lb 10.9 oz (87.4 kg)     SpO2: 99% 98% 98% 99%    Intake/Output Summary (Last 24 hours) at 08/20/13 0813 Last data filed at 08/20/13 0800  Gross per 24 hour  Intake 817.67 ml  Output   3575 ml  Net -2757.33 ml    SUBJECTIVE Patient alert on vent. Wants ET tube out. Febrile. Diuresing well.  LABS: Basic Metabolic Panel:  Recent Labs  40/98/11 2330 08/20/13 0500  NA 141 141  K 3.4* 3.5  CL 99 100  CO2 27 27  GLUCOSE 158* 151*  BUN 16 17  CREATININE 0.87 0.94  CALCIUM 9.2 9.5   Liver Function Tests:  Recent Labs  08/19/13 1014  AST 39*  ALT 18  ALKPHOS 60  BILITOT 0.4  PROT 7.1  ALBUMIN 3.8   No results found for this basename: LIPASE, AMYLASE,  in the last 72 hours CBC:  Recent Labs  08/19/13 1014 08/19/13 1043 08/20/13 0500  WBC 11.6*  --  17.3*  NEUTROABS 6.5  --   --   HGB 13.9 15.6* 13.8  HCT 41.4 46.0 40.0  MCV 70.2*  --  67.9*  PLT 183  --  168   Cardiac Enzymes:  Recent Labs  08/19/13 1300 08/19/13 2329 08/20/13 0058  TROPONINI <0.30 <0.30 <0.30   BNP: 1769  Hemoglobin A1C:  Recent Labs  08/19/13 1330  HGBA1C 6.4*   Fasting Lipid Panel:  Recent Labs  08/20/13 0500  CHOL 154  HDL 53  LDLCALC 68  TRIG 166*  CHOLHDL 2.9    Radiology/Studies:  Dg Chest Port 1 View  08/20/2013   *RADIOLOGY REPORT*  Clinical Data: Hypoxia  PORTABLE CHEST - 1 VIEW  Comparison:  August 19, 2013  Findings:   Endotracheal tube tip is 1.8 cm above the carina. Central catheter tip is in the right atrium, stable.  No pneumothorax.  Pacemaker lead is attached to the ventricle.  There is consolidation throughout the left lower lobe  with small left effusion.  Lungs elsewhere clear.  Heart is mildly enlarged with pulmonary venous hypertension.  No adenopathy.  IMPRESSION: Suspect a degree of congestive heart failure.  Superimposed consolidation left lower lobe.  No pneumothorax.   Original Report Authenticated By: Bretta Bang, M.D.   Dg Chest Port 1 View  08/19/2013   *RADIOLOGY REPORT*  Clinical Data: Central line placement.  PORTABLE CHEST - 1 VIEW  Comparison: 08/19/2013.  Findings: AICD device unchanged and stable.  Cardiomegaly with mild vascular congestion.  ET tube unchanged. Prior cervical fusion.  A right central venous catheter has been placed without pneumothorax.  Tip lies 1-2 cm below the cavoatrial junction. Repeat radiograph after slight repositioning is recommended.  IMPRESSION: Central venous catheter tip slightly low within the right atrium. Recommend withdrawal 1-2 cm and repeat radiograph.   Original Report Authenticated By: Davonna Belling, M.D.   Dg Chest Portable 1 View  08/19/2013   *RADIOLOGY REPORT*  Clinical Data: Respiratory stress  PORTABLE CHEST - 1 VIEW  Comparison: None.  Findings: The cardiac shadow is mildly enlarged.  A defibrillator  is noted.  An endotracheal tube is seen approximately 4 cm above the carina.  Central vascular congestion is noted.  Some very mild interstitial edema is seen.  IMPRESSION: Mild CHF.   Original Report Authenticated By: Alcide Clever, M.D.   Ecg: sinus tachy, LVH with repolarization abnormality.  PHYSICAL EXAM General: Well developed, well nourished, nasally intubated. Head: Normal Neck: Negative for carotid bruits. JVD not elevated. Lungs: Coarse BS. Bilateral rales.Breathing is unlabored. Heart: RRR S1 S2 without murmurs, rubs, or gallops.  Abdomen: Soft, non-tender, non-distended with normoactive bowel sounds. No hepatomegaly. No rebound/guarding. No obvious abdominal masses. Msk:  Strength and tone appears normal for age. Extremities: No clubbing, cyanosis or  edema.  Distal pedal pulses are 2+ and equal bilaterally. Neuro: Alert and oriented X 3. Moves all extremities spontaneously. Psych:  Responds to questions appropriately with a normal affect.  ASSESSMENT AND PLAN: 1. Acute on chronic systolic CHF-EF 20%. Diuresing well. According to my records she was not on an ACEi/ARB prior to admission because of a history of profound hypotension and ARF in October 2013. She was on lasix and coreg. Would consider resuming Coreg. Continue IV lasix.  2. Fever. ? LLL consolidation. Possible PNA. 3. Vent dependent respiratory failure. 4. CAD s/p DES of distal RCA 2011, proximal RCA in 2012, and mid RCA in 4/14. Need to continue antiplatelet therapy with Plavix. History of intolerance to ASA and Brilinta. 5. HTN 6. S/p ICD  Patient admitted under a new medical record number. All prior records are present in Epic under old medical record # [id number].  Active Problems:   Acute respiratory failure with hypoxia   Acute systolic CHF (congestive heart failure)   Encephalopathy acute    Signed, Peter [place] MD,FACC 08/20/2013 8:28 AM

## 2013-08-20 NOTE — Progress Notes (Signed)
ANTIBIOTIC CONSULT NOTE - FOLLOW UP  Pharmacy Consult for Vancomycin and Cefepime Indication: Sepsis  Allergies  Allergen Reactions  . Asa [Aspirin] Shortness Of Breath  . Diovan [Valsartan] Other (See Comments)    unknown  . Nsaids Other (See Comments)    GI upset     Patient Measurements: Height: 5\' 5"  (165.1 cm) Weight: 192 lb 10.9 oz (87.4 kg) IBW/kg (Calculated) : 57   Vital Signs: Temp: 100 F (37.8 C) (08/21 0900) Temp src: Core (Comment) (08/21 0344) BP: 149/89 mmHg (08/21 0900) Pulse Rate: 118 (08/21 0900) Intake/Output from previous day: 08/20 0701 - 08/21 0700 In: 797.7 [I.V.:347.7; IV Piggyback:450] Out: 3275 [Urine:3275] Intake/Output from this shift: Total I/O In: 40 [I.V.:40] Out: 300 [Urine:300]  Labs:  Recent Labs  08/19/13 1014 08/19/13 1043 08/19/13 1630 08/19/13 2330 08/20/13 0500  WBC 11.6*  --   --   --  17.3*  HGB 13.9 15.6*  --   --  13.8  PLT 183  --   --   --  168  CREATININE 1.03 1.20* 0.89 0.87 0.94   Estimated Creatinine Clearance: 67.8 ml/min (by C-G formula based on Cr of 0.94). No results found for this basename: VANCOTROUGH, VANCOPEAK, VANCORANDOM, GENTTROUGH, GENTPEAK, GENTRANDOM, TOBRATROUGH, TOBRAPEAK, TOBRARND, AMIKACINPEAK, AMIKACINTROU, AMIKACIN,  in the last 72 hours   Microbiology: Recent Results (from the past 720 hour(s))  CULTURE, BLOOD (ROUTINE X 2)     Status: None   Collection Time    08/19/13 10:14 AM      Result Value Range Status   Specimen Description BLOOD LEFT ANTECUBITAL   Final   Special Requests BOTTLES DRAWN AEROBIC ONLY 5CC   Final   Culture  Setup Time     Final   Value: 08/19/2013 14:08     Performed at [facility]   Culture     Final   Value: GRAM POSITIVE COCCI IN CLUSTERS     Note: Gram Stain Report Called to,Read Back By and Verified With: LINDA B @ 0925 08/20/13 BY KRAWS     Performed at [facility]   Report Status PENDING   Incomplete  URINE CULTURE     Status: None    Collection Time    08/19/13 10:29 AM      Result Value Range Status   Specimen Description URINE, CATHETERIZED   Final   Special Requests NONE   Final   Culture  Setup Time     Final   Value: 08/19/2013 11:40     Performed at [facility] Count     Final   Value: NO GROWTH     Performed at [facility]   Culture     Final   Value: NO GROWTH     Performed at [facility]   Report Status 08/20/2013 FINAL   Final  CULTURE, BLOOD (ROUTINE X 2)     Status: None   Collection Time    08/19/13 11:10 AM      Result Value Range Status   Specimen Description BLOOD RIGHT HAND   Final   Special Requests BOTTLES DRAWN AEROBIC ONLY 10CC   Final   Culture  Setup Time     Final   Value: 08/19/2013 14:08     Performed at [facility]   Culture     Final   Value:        BLOOD CULTURE RECEIVED NO GROWTH TO DATE CULTURE WILL BE  HELD FOR 5 DAYS BEFORE ISSUING A FINAL NEGATIVE REPORT     Performed at [facility]   Report Status PENDING   Incomplete  MRSA PCR SCREENING     Status: None   Collection Time    08/19/13 12:41 PM      Result Value Range Status   MRSA by PCR NEGATIVE  NEGATIVE Final   Comment:            The GeneXpert MRSA Assay (FDA     approved for NASAL specimens     only), is one component of a     comprehensive MRSA colonization     surveillance program. It is not     intended to diagnose MRSA     infection nor to guide or     monitor treatment for     MRSA infections.    Anti-infectives   Start     Dose/Rate Route Frequency Ordered Stop   08/20/13 1200  vancomycin (VANCOCIN) IVPB 1000 mg/200 mL premix     1,000 mg 200 mL/hr over 60 Minutes Intravenous Every 12 hours 08/20/13 0955     08/20/13 0000  vancomycin (VANCOCIN) IVPB 750 mg/150 ml premix  Status:  Discontinued     750 mg 150 mL/hr over 60 Minutes Intravenous Every 12 hours 08/19/13 1141 08/20/13 0955   08/19/13 2200  ceFEPIme (MAXIPIME) 1 g in dextrose 5 % 50 mL  IVPB     1 g 100 mL/hr over 30 Minutes Intravenous 3 times per day 08/19/13 1141     08/19/13 1030  ceFEPIme (MAXIPIME) 2 g in dextrose 5 % 50 mL IVPB     2 g 100 mL/hr over 30 Minutes Intravenous  Once 08/19/13 1024 08/19/13 1200   08/19/13 1030  vancomycin (VANCOCIN) IVPB 1000 mg/200 mL premix     1,000 mg 200 mL/hr over 60 Minutes Intravenous  Once 08/19/13 1024 08/19/13 1300      Assessment: 62 y/o Andrea [name] admitted on 8/20 for SOB and AMS. Patient was intubated by EMS. She is currently on vancomycin and cefepime for sepsis with a Tmax 101.5 and WBC 17.3 (trending up). Her renal function has improved - Scr 0.94, CrCl ~ 68, and UOP 1.5 mL/kg/hr.    8/20 Bld x 2 >> gram + cocci clusters x 1 8/20 Urine >> no growth 8/20 MRSA PCR negative  Goal of Therapy:  Vancomycin trough level 15-20 mcg/ml  Plan:  - Change vancomycin to 1000mg  IV q12h - Continue cefepime 1gm IV q8h - Monitor renal function, cultures, WBC, and temp - F/U with steady-state trough level if necessary  Laiklynn Raczynski A. Lenon Ahmadi, PharmD Clinical Pharmacist - Resident Pager: [phone] Pharmacy: [phone] 08/20/2013 10:06 AM

## 2013-08-21 ENCOUNTER — Inpatient Hospital Stay (HOSPITAL_COMMUNITY): Payer: BC Managed Care – PPO

## 2013-08-21 DIAGNOSIS — R0602 Shortness of breath: Secondary | ICD-10-CM

## 2013-08-21 LAB — CBC
Hemoglobin: 12.5 g/dL (ref 12.0–15.0)
MCH: 23.2 pg — ABNORMAL LOW (ref 26.0–34.0)
MCV: 69 fL — ABNORMAL LOW (ref 78.0–100.0)
Platelets: 129 10*3/uL — ABNORMAL LOW (ref 150–400)
RBC: 5.38 MIL/uL — ABNORMAL HIGH (ref 3.87–5.11)
WBC: 11.1 10*3/uL — ABNORMAL HIGH (ref 4.0–10.5)

## 2013-08-21 LAB — GLUCOSE, CAPILLARY: Glucose-Capillary: 98 mg/dL (ref 70–99)

## 2013-08-21 LAB — BASIC METABOLIC PANEL
CO2: 30 mEq/L (ref 19–32)
Calcium: 9 mg/dL (ref 8.4–10.5)
GFR calc non Af Amer: 61 mL/min — ABNORMAL LOW (ref >90)
Glucose, Bld: 159 mg/dL — ABNORMAL HIGH (ref 70–99)
Potassium: 3.7 mEq/L (ref 3.5–5.1)
Sodium: 135 mEq/L (ref 135–145)

## 2013-08-21 LAB — PROCALCITONIN: Procalcitonin: 7.49 ng/mL

## 2013-08-21 MED ORDER — ALBUTEROL SULFATE (5 MG/ML) 0.5% IN NEBU: 2.5000 mg | INHALATION_SOLUTION | Freq: Four times a day (QID) | RESPIRATORY_TRACT | Status: DC

## 2013-08-21 MED ORDER — LISINOPRIL 10 MG PO TABS
10.0000 mg | ORAL_TABLET | Freq: Every day | ORAL | Status: DC
  Filled 2013-08-21: qty 1

## 2013-08-21 MED ORDER — LINAGLIPTIN 5 MG PO TABS
5.0000 mg | ORAL_TABLET | Freq: Every day | ORAL | Status: DC
  Administered 2013-08-21 – 2013-08-25 (×5): 5 mg via ORAL
  Filled 2013-08-21 (×6): qty 1

## 2013-08-21 MED ORDER — CARVEDILOL 3.125 MG PO TABS
3.1250 mg | ORAL_TABLET | Freq: Two times a day (BID) | ORAL | Status: DC
  Administered 2013-08-21 – 2013-08-23 (×4): 3.125 mg via ORAL
  Filled 2013-08-21 (×6): qty 1

## 2013-08-21 MED ORDER — IPRATROPIUM BROMIDE 0.02 % IN SOLN
0.5000 mg | Freq: Four times a day (QID) | RESPIRATORY_TRACT | Status: DC
  Administered 2013-08-21 – 2013-08-25 (×17): 0.5 mg via RESPIRATORY_TRACT
  Filled 2013-08-21 (×3): qty 2.5
  Filled 2013-08-21: qty 7.5
  Filled 2013-08-21 (×6): qty 2.5
  Filled 2013-08-21 (×2): qty 7.5
  Filled 2013-08-21 (×5): qty 2.5

## 2013-08-21 MED ORDER — PANTOPRAZOLE SODIUM 40 MG PO TBEC
40.0000 mg | DELAYED_RELEASE_TABLET | Freq: Every day | ORAL | Status: DC
  Administered 2013-08-21 – 2013-08-22 (×2): 40 mg via ORAL
  Filled 2013-08-21 (×2): qty 1

## 2013-08-21 MED ORDER — ALBUTEROL SULFATE (5 MG/ML) 0.5% IN NEBU
2.5000 mg | INHALATION_SOLUTION | Freq: Four times a day (QID) | RESPIRATORY_TRACT | Status: DC
  Administered 2013-08-21 – 2013-08-25 (×17): 2.5 mg via RESPIRATORY_TRACT
  Filled 2013-08-21 (×17): qty 0.5

## 2013-08-21 MED ORDER — BOOST / RESOURCE BREEZE PO LIQD
1.0000 | Freq: Two times a day (BID) | ORAL | Status: DC
  Administered 2013-08-21 – 2013-08-25 (×7): 1 via ORAL

## 2013-08-21 MED ORDER — IPRATROPIUM BROMIDE 0.02 % IN SOLN: 0.5000 mg | Freq: Four times a day (QID) | RESPIRATORY_TRACT | Status: DC

## 2013-08-21 MED ORDER — ALBUTEROL SULFATE (5 MG/ML) 0.5% IN NEBU: 2.5000 mg | INHALATION_SOLUTION | RESPIRATORY_TRACT | Status: DC | PRN

## 2013-08-21 MED ORDER — PHENOL 1.4 % MT LIQD
1.0000 | OROMUCOSAL | Status: DC | PRN
  Filled 2013-08-21: qty 177

## 2013-08-21 MED ORDER — SODIUM CHLORIDE 0.9 % IV BOLUS (SEPSIS)
500.0000 mL | Freq: Once | INTRAVENOUS | Status: AC
Start: 2013-08-21 — End: 2013-08-21
  Administered 2013-08-21: 500 mL via INTRAVENOUS

## 2013-08-21 MED ORDER — LEVOFLOXACIN 500 MG PO TABS
500.0000 mg | ORAL_TABLET | Freq: Every day | ORAL | Status: DC
  Administered 2013-08-21 – 2013-08-25 (×5): 500 mg via ORAL
  Filled 2013-08-21 (×6): qty 1

## 2013-08-21 MED ORDER — DEXTROMETHORPHAN POLISTIREX 30 MG/5ML PO LQCR
30.0000 mg | Freq: Two times a day (BID) | ORAL | Status: DC | PRN
  Administered 2013-08-21 – 2013-08-25 (×3): 30 mg via ORAL
  Filled 2013-08-21 (×4): qty 5

## 2013-08-21 NOTE — Progress Notes (Signed)
PULMONARY  / CRITICAL CARE MEDICINE  Name: Andrea [name] MRN: [id number] DOB: 03-01-1951    ADMISSION DATE:  08/19/2013  REFERRING MD :  EDP PRIMARY SERVICE: PCCM  CHIEF COMPLAINT:  SOB  BRIEF PATIENT DESCRIPTION: 62 yo female with hx CHF presented to [facility] ED 8/20 with SOB and AMS requiring (nasal) intubation by EMS.  In ER pt hypoxic, CXR c/w CHF.  PCCM called to admit.   SIGNIFICANT EVENTS / STUDIES:  8/20 Echo: The cavity size was normal. Wall thickness was increased in a pattern of mild LVH. The estimated ejection fraction was 20%. Diffuse hypokinesis  8/21 elevated PCT. Development of LLL consolidation. Positive BC. Working dx: CAP 8/21: Passed SBT. Cognition fully intact. Extubated  LINES / TUBES: Nasal ETT 8/20 >> 8/21 R [place] CVL 8/20 >>    CULTURES: PCT 8/20-8/22 >> 4.49, 10.62,    Urine 8/20 >> NEG Resp 8/20 >>  BCx2 8/20 >> 1/2 staph species, NOS >>    ANTIBIOTICS: Cefepime 8/20 >> 8/22 Vancomycin 8/20 >>  Levofloxacin 8/22 >>   VITAL SIGNS: Temp:  [97.5 F (36.4 C)-99 F (37.2 C)] 97.6 F (36.4 C) (08/22 1156) Pulse Rate:  [93-114] 98 (08/22 1300) Resp:  [14-29] 26 (08/22 1300) BP: (81-131)/(38-103) 131/103 mmHg (08/22 1300) SpO2:  [96 %-100 %] 99 % (08/22 1300) HEMODYNAMICS:   VENTILATOR SETTINGS:   INTAKE / OUTPUT: Intake/Output     08/21 0701 - 08/22 0700 08/22 0701 - 08/23 0700   P.O. 200    I.V. (mL/kg) 380 (4.3) 120 (1.4)   IV Piggyback 450 500   Total Intake(mL/kg) 1030 (11.8) 620 (7.1)   Urine (mL/kg/hr) 645 (0.3)    Total Output 645     Net +385 +620        Urine Occurrence 3 x    Stool Occurrence 1 x      PHYSICAL EXAMINATION: General:  RASS 0. + F/C. No resp distress post extubation Neuro:  MAEs, no focal deficits HEENT:  WNL Cardiovascular:  Tachy, reg, no M  Lungs:  Clear anteriorly without wheezes, diminished in L base Abdomen:  Obese, soft, +bs Musculoskeletal:  Warm and dry, no edema   LABS:  CBC Recent Labs   08/19/13  1014  08/19/13  1043  08/20/13  0500  08/21/13  0500  WBC  11.6*   --   17.3*  11.1*  HGB  13.9  15.6*  13.8  12.5  HCT  41.4  46.0  40.0  37.1  PLT  183   --   168  129*   Coag's Recent Labs     08/19/13  1014  APTT  25  INR  1.07   BMET Recent Labs     08/19/13  2330  08/20/13  0500  08/21/13  0500  NA  141  141  135  K  3.4*  3.5  3.7  CL  99  100  97  CO2  27  27  30   BUN  16  17  25*  CREATININE  0.87  0.94  0.98  GLUCOSE  158*  151*  159*   Electrolytes Recent Labs     08/19/13  2330  08/20/13  0500  08/21/13  0500  CALCIUM  9.2  9.5  9.0   Sepsis Markers Recent Labs     08/19/13  1148  08/20/13  0500  08/21/13  0500  PROCALCITON  4.49  10.62  7.49   ABG Recent Labs  08/19/13  1035  08/19/13  1559  08/20/13  0343  PHART  7.198*  7.365  7.441  PCO2ART  59.0*  44.5  39.1  PO2ART  104.0*  174.0*  136.0*   Liver Enzymes Recent Labs     08/19/13  1014  AST  39*  ALT  18  ALKPHOS  60  BILITOT  0.4  ALBUMIN  3.8   Cardiac Enzymes Recent Labs     08/19/13  1024  08/19/13  1300  08/19/13  2329  08/20/13  0058  TROPONINI   --   <0.30  <0.30  <0.30  PROBNP  1769.0*   --    --    --    Glucose Recent Labs     08/20/13  0749  08/20/13  1121  08/20/13  1543  08/20/13  2020  08/21/13  0802  08/21/13  1141  GLUCAP  139*  128*  177*  145*  154*  125*    CXR: CM, vasc congestion, L>R basilar AS dz   ASSESSMENT / PLAN:  PULMONARY Acute respiratory failure, resolved Pulm edema, mild  Community acquired PNA, NOS Former smoker - no known hx COPD P:   Transfer to [employer] supplemental O2 to maintain SpO2 > 93% Cont nebulized BDs  CARDIOVASCULAR Severe nonischemic CM Reactive tachycardia, resolved P:  PRN metoprolol to maintain HR < 110/min Cont carvedilol Holding lisinopril as BP marginally low  RENAL Acute renal insufficiency, resolved   AG metab acidosis/ Lactic acidosis, resolved P:   Monitor BMET  intermittently Correct electrolytes as indicated   GASTROINTESTINAL No active issue  P:   Cont CHO mod diet  HEMATOLOGIC Mild thrombocytopenia P:  Monitor CBC intermittently  INFECTIOUS Suspect LLL CAP Elevated PCT 1/2 positive BC, staph species - probable contaminant P:   Change cefepime to levofloxacin Cont Vanc pending final ID - if coag neg staph, would DC  If S aureus, follow up sensitivities and adjust abx accordingly  ENDOCRINE DM II, controlled P:   Cont SSI   NEUROLOGIC Acute encephalopathy, resolved  Post extubation weakness P:   Minimize sedation PT consult 8/22  Transfer to med-surg 8/22. Will remains on PCCM as likely DC home soon    Billy Fischer, MD ; [facility] [phone].  After 5:30 PM or weekends, call [phone]

## 2013-08-21 NOTE — Evaluation (Signed)
Physical Therapy Evaluation Patient Details Name: Andrea [name] MRN: [id number] DOB: [birth year] Today's Date: 08/21/2013 Time: 9528-4132 PT Time Calculation (min): 16 min  PT Assessment / Plan / Recommendation History of Present Illness  62 yo female with hx CHF presented to [facility] ED 8/20 with SOB and AMS requiring (nasal) intubation by EMS.  In ER pt hypoxic, CXR c/w CHF  Clinical Impression  Pt able to ambulate with only supervision for cueing and lines. Anticipate pt will progress quickly to meet goals and return home with partner. Pt does not work and partner works during the day. Will follow acutely to increase activity tolerance and function to maximize independence for discharge. Encourage daily ambulation with staff.     PT Assessment  Patient needs continued PT services    Follow Up Recommendations  No PT follow up    Does the patient have the potential to tolerate intense rehabilitation      Barriers to Discharge Decreased caregiver support      Equipment Recommendations  None recommended by PT    Recommendations for Other Services     Frequency Min 3X/week    Precautions / Restrictions Precautions Precautions: Fall   Pertinent Vitals/Pain No pain, pt reported nausea midway through gait no emesis sats 90-96% on 1L with gait HR 115      Mobility  Bed Mobility Bed Mobility: Not assessed Transfers Transfers: Sit to Stand;Stand to Sit Sit to Stand: 6: Modified independent (Device/Increase time);From chair/3-in-1;With armrests Stand to Sit: 6: Modified independent (Device/Increase time);To chair/3-in-1;With armrests Ambulation/Gait Ambulation/Gait Assistance: 5: Supervision Ambulation Distance (Feet): 150 Feet Assistive device: None Ambulation/Gait Assistance Details: slow cautious gait with cues for breathing technique and energy conservation Gait Pattern: Step-through pattern;Decreased stride length Gait velocity: decreased Stairs: No    Exercises      PT Diagnosis: Difficulty walking  PT Problem List: Decreased activity tolerance;Decreased mobility PT Treatment Interventions: Gait training;Stair training;Functional mobility training;Therapeutic activities;Patient/family education;Therapeutic exercise     PT Goals(Current goals can be found in the care plan section) Acute Rehab PT Goals Patient Stated Goal: return home PT Goal Formulation: With patient Time For Goal Achievement: 08/28/13 Potential to Achieve Goals: Good  Visit Information  Last PT Received On: 08/21/13 Assistance Needed: +1 History of Present Illness: 62 yo female with hx CHF presented to [facility] ED 8/20 with SOB and AMS requiring (nasal) intubation by EMS.  In ER pt hypoxic, CXR c/w CHF       Prior Functioning  Home Living Family/patient expects to be discharged to:: Private residence Living Arrangements: Spouse/significant other Available Help at Discharge: Family;Available PRN/intermittently Type of Home: House Home Access: Stairs to enter [employer] of Steps: 1 Home Layout: One level Home Equipment: Environmental consultant - 2 wheels Prior Function Level of Independence: Independent Communication Communication: No difficulties    Cognition  Cognition Arousal/Alertness: Awake/alert Behavior During Therapy: WFL for tasks assessed/performed Overall Cognitive Status: Within Functional Limits for tasks assessed    Extremity/Trunk Assessment Upper Extremity Assessment Upper Extremity Assessment: Overall WFL for tasks assessed Lower Extremity Assessment Lower Extremity Assessment: Generalized weakness Cervical / Trunk Assessment Cervical / Trunk Assessment: Normal   Balance    End of Session PT - End of Session Equipment Utilized During Treatment: Gait belt Activity Tolerance: Patient tolerated treatment well Patient left: in chair;with call bell/phone within reach Nurse Communication: Mobility status  GP     Delorse Lek 08/21/2013, 1:55  PM Delaney Meigs, PT [phone]

## 2013-08-21 NOTE — Significant Event (Signed)
Pt c/o dry cough.  Will order prn delsym.  Coralyn Helling, MD 08/21/2013, 9:35 PM

## 2013-08-21 NOTE — Progress Notes (Signed)
INITIAL NUTRITION ASSESSMENT  DOCUMENTATION CODES Per approved criteria  -Obesity Unspecified   INTERVENTION: - Resource Breeze po BID with meals, each supplement provides 250 kcal and 9 grams of protein.  NUTRITION DIAGNOSIS: Inadequate oral intake related to pain when swallowing r/t recent extubation as evidenced by reported intake less than estimated needs.   Goal: Pt to meet >/= 90% of their estimated nutrition needs   Monitor:  Weight, po intake, labs, blood glucose  Reason for Assessment: MST  62 y.o. female  Admitting Dx: <principal problem not specified>  ASSESSMENT: Pt admitted with SOB and AMS requiring intubation by EMS and was extubated on 8/21.   Pt eating 25% of meals at this time and complains of throat pain from intubation.  Pt was unsure of her usual body weight or if she has recently lost weight, but she did report a poor appetite and poor intake. She reports that glucerna and ensure both cause her to have diarrhea. Will send Resource Breeze bid.  Height: Ht Readings from Last 1 Encounters:  08/19/13 5\' 5"  (1.651 m)    Weight: Wt Readings from Last 1 Encounters:  08/20/13 192 lb 10.9 oz (87.4 kg)    Ideal Body Weight: 57 kg  % Ideal Body Weight: 153%  Wt Readings from Last 10 Encounters:  08/20/13 192 lb 10.9 oz (87.4 kg)    Usual Body Weight: unknown  % Usual Body Weight: unknown  BMI:  Body mass index is 32.06 kg/(m^2).  Estimated Nutritional Needs: Kcal: 2100-2300 Protein: 105-115 g Fluid: 2.1-2.3 L  Skin: WNL  Diet Order: Carb Control  EDUCATION NEEDS: -Education not appropriate at this time   Intake/Output Summary (Last 24 hours) at 08/21/13 1046 Last data filed at 08/21/13 0900  Gross per 24 hour  Intake    950 ml  Output    245 ml  Net    705 ml    Last BM: none recorded   Labs:   Recent Labs Lab 08/19/13 2330 08/20/13 0500 08/21/13 0500  NA 141 141 135  K 3.4* 3.5 3.7  CL 99 100 97  CO2 27 27 30   BUN  16 17 25*  CREATININE 0.87 0.94 0.98  CALCIUM 9.2 9.5 9.0  GLUCOSE 158* 151* 159*    CBG (last 3)   Recent Labs  08/20/13 1543 08/20/13 2020 08/21/13 0802  GLUCAP 177* 145* 154*    Scheduled Meds: . albuterol  2.5 mg Nebulization QID   And  . ipratropium  0.5 mg Nebulization QID  . antiseptic oral rinse  15 mL Mouth Rinse QID  . atorvastatin  40 mg Oral q1800  . carvedilol  6.25 mg Oral BID WC  . clopidogrel  75 mg Oral Q breakfast  . DULoxetine  60 mg Oral Daily  . enoxaparin (LOVENOX) injection  40 mg Subcutaneous Q24H  . insulin aspart  0-5 Units Subcutaneous QHS  . insulin aspart  0-9 Units Subcutaneous TID WC  . insulin aspart  3 Units Subcutaneous TID WC  . levofloxacin  500 mg Oral Daily  . linagliptin  5 mg Oral Daily  . lisinopril  10 mg Oral Daily  . pantoprazole  40 mg Oral QHS  . vancomycin  1,000 mg Intravenous Q12H    Continuous Infusions: . sodium chloride 20 mL/hr at 08/20/13 1500    Past Medical History  Diagnosis Date  . CHF (congestive heart failure)   . CKD (chronic kidney disease) stage 3, GFR 30-59 ml/min   . DM (  diabetes mellitus)   . CVA (cerebral infarction)   . HTN (hypertension)   . Primary pulmonary HTN     History reviewed. No pertinent past surgical history.  Ebbie Latus RD, LDN

## 2013-08-21 NOTE — Progress Notes (Signed)
Pharmacist Heart Failure Core Measure Documentation  Assessment: Andrea [name] has an EF documented as 20% on 08/19/2013 by echo.  Rationale: Heart failure patients with left ventricular systolic dysfunction (LVSD) and an EF < 40% should be prescribed an angiotensin converting enzyme inhibitor (ACEI) or angiotensin receptor blocker (ARB) at discharge unless a contraindication is documented in the medical record.  This patient is not currently on an ACEI or ARB for HF.  This note is being placed in the record in order to provide documentation that a contraindication to the use of these agents is present for this encounter.  ACE Inhibitor or Angiotensin Receptor Blocker is contraindicated (specify all that apply)  []   ACEI allergy AND ARB allergy []   Angioedema []   Moderate or severe aortic stenosis []   Hyperkalemia [x]   Hypotension []   Renal artery stenosis []   Worsening renal function, preexisting renal disease or dysfunction   [name] 08/21/2013 11:49 AM

## 2013-08-21 NOTE — Progress Notes (Signed)
eLink Physician-Brief Progress Note Patient Name: Andrea [name] DOB: Apr 09, 1951 MRN: [id number]  Date of Service  08/21/2013   HPI/Events of Note   Pt hypotensive on coreg.  Will cancel tfr and keep in icu.   eICU Interventions  Reduce coreg to 3.125mg  bid Hold tfr to tele   Intervention Category Major Interventions: Hypotension - evaluation and management  Shan Levans 08/21/2013, 4:33 PM

## 2013-08-21 NOTE — Progress Notes (Addendum)
TELEMETRY: Reviewed telemetry pt in sinus tachy, rare PVC: Filed Vitals:   08/21/13 0300 08/21/13 0400 08/21/13 0500 08/21/13 0600  BP: 101/55 92/55 91/59  99/56  Pulse: 104 106 108 103  Temp:  98.1 F (36.7 C)    TempSrc:  Oral    Resp: 26 14 24 21   Height:      Weight:      SpO2: 99% 96% 98% 97%    Intake/Output Summary (Last 24 hours) at 08/21/13 0726 Last data filed at 08/21/13 0600  Gross per 24 hour  Intake   1010 ml  Output    645 ml  Net    365 ml    SUBJECTIVE Extubated. Complained of coughing all night. Still some SOB. Feels sleepy.  LABS: Basic Metabolic Panel:  Recent Labs  16/10/96 0500 08/21/13 0500  NA 141 135  K 3.5 3.7  CL 100 97  CO2 27 30  GLUCOSE 151* 159*  BUN 17 25*  CREATININE 0.94 0.98  CALCIUM 9.5 9.0   Liver Function Tests:  Recent Labs  08/19/13 1014  AST 39*  ALT 18  ALKPHOS 60  BILITOT 0.4  PROT 7.1  ALBUMIN 3.8   No results found for this basename: LIPASE, AMYLASE,  in the last 72 hours CBC:  Recent Labs  08/19/13 1014  08/20/13 0500 08/21/13 0500  WBC 11.6*  --  17.3* 11.1*  NEUTROABS 6.5  --   --   --   HGB 13.9  < > 13.8 12.5  HCT 41.4  < > 40.0 37.1  MCV 70.2*  --  67.9* 69.0*  PLT 183  --  168 129*  < > = values in this interval not displayed. Cardiac Enzymes:  Recent Labs  08/19/13 1300 08/19/13 2329 08/20/13 0058  TROPONINI <0.30 <0.30 <0.30   BNP: 1769  Hemoglobin A1C:  Recent Labs  08/19/13 1330  HGBA1C 6.4*   Fasting Lipid Panel:  Recent Labs  08/20/13 0500  CHOL 154  HDL 53  LDLCALC 68  TRIG 166*  CHOLHDL 2.9    Radiology/Studies:  Dg Chest Port 1 View  08/20/2013   *RADIOLOGY REPORT*  Clinical Data: Hypoxia  PORTABLE CHEST - 1 VIEW  Comparison:  August 19, 2013  Findings:   Endotracheal tube tip is 1.8 cm above the carina. Central catheter tip is in the right atrium, stable.  No pneumothorax.  Pacemaker lead is attached to the ventricle.  There is consolidation throughout  the left lower lobe with small left effusion.  Lungs elsewhere clear.  Heart is mildly enlarged with pulmonary venous hypertension.  No adenopathy.  IMPRESSION: Suspect a degree of congestive heart failure.  Superimposed consolidation left lower lobe.  No pneumothorax.   Original Report Authenticated By: Bretta Bang, M.D.   Dg Chest Port 1 View  08/19/2013   *RADIOLOGY REPORT*  Clinical Data: Central line placement.  PORTABLE CHEST - 1 VIEW  Comparison: 08/19/2013.  Findings: AICD device unchanged and stable.  Cardiomegaly with mild vascular congestion.  ET tube unchanged. Prior cervical fusion.  A right central venous catheter has been placed without pneumothorax.  Tip lies 1-2 cm below the cavoatrial junction. Repeat radiograph after slight repositioning is recommended.  IMPRESSION: Central venous catheter tip slightly low within the right atrium. Recommend withdrawal 1-2 cm and repeat radiograph.   Original Report Authenticated By: Davonna Belling, M.D.   Dg Chest Portable 1 View  08/19/2013   *RADIOLOGY REPORT*  Clinical Data: Respiratory stress  PORTABLE CHEST -  1 VIEW  Comparison: None.  Findings: The cardiac shadow is mildly enlarged.  A defibrillator is noted.  An endotracheal tube is seen approximately 4 cm above the carina.  Central vascular congestion is noted.  Some very mild interstitial edema is seen.  IMPRESSION: Mild CHF.   Original Report Authenticated By: Alcide Clever, M.D.   Ecg: sinus tachy, LVH with repolarization abnormality.  Echo:Study Conclusions  - Left ventricle: The cavity size was normal. Wall thickness was increased in a pattern of mild LVH. The estimated ejection fraction was 20%. Diffuse hypokinesis. There is akinesis of the mid-distalanteroseptal myocardium. The study is not technically sufficient to allow evaluation of LV diastolic function. - Mitral valve: Mild regurgitation. - Pulmonary arteries: PA peak pressure: 35mm Hg (S).  PHYSICAL EXAM General: Well  developed, well nourished, NAD Head: Normal Neck: Negative for carotid bruits. JVD not elevated. Lungs: Coarse BS. Bilateral rhonchi. Breathing is unlabored. Heart: RRR S1 S2 without murmurs, rubs, or gallops.  Abdomen: Soft, non-tender, non-distended with normoactive bowel sounds. No hepatomegaly. No rebound/guarding. No obvious abdominal masses. Msk:  Strength and tone appears normal for age. Extremities: No clubbing, cyanosis or edema.  Distal pedal pulses are 2+ and equal bilaterally. Neuro: Alert and oriented X 3. Moves all extremities spontaneously. Psych:  Responds to questions appropriately with a normal affect.  ASSESSMENT AND PLAN: 1. Acute on chronic systolic CHF-EF 20%.  According to my records she was not on an ACEi/ARB prior to admission because of a history of profound hypotension and ARF in October 2013 and recurrent hypotension in April. She was on lasix and coreg. Would consider resuming Coreg as BP allows. Resume oral lasix. 2. CAP on IV antibiotics. 1 of 2 BC positive for gram positive cocci in clusters. 3. Vent dependent respiratory failure. Now extubated.  4. CAD s/p DES of distal RCA 2011, proximal RCA in 2012, and mid RCA in 4/14. Need to continue antiplatelet therapy with Plavix. History of intolerance to ASA and Brilinta. 5. HTN 6. S/p ICD  Patient admitted under a new medical record number. All prior records are present in Epic under old medical record # [id number].  Active Problems:   Acute respiratory failure with hypoxia   Acute systolic CHF (congestive heart failure)   Encephalopathy acute   CAP (community acquired pneumonia)   Positive blood culture   Cardiomyopathy   DM2 (diabetes mellitus, type 2)    Signed, Alyssabeth Bruster [place] MD,FACC 08/21/2013 7:26 AM

## 2013-08-22 ENCOUNTER — Inpatient Hospital Stay (HOSPITAL_COMMUNITY): Payer: BC Managed Care – PPO

## 2013-08-22 DIAGNOSIS — R4182 Altered mental status, unspecified: Secondary | ICD-10-CM

## 2013-08-22 LAB — CULTURE, BLOOD (ROUTINE X 2)

## 2013-08-22 LAB — GLUCOSE, CAPILLARY
Glucose-Capillary: 100 mg/dL — ABNORMAL HIGH (ref 70–99)
Glucose-Capillary: 105 mg/dL — ABNORMAL HIGH (ref 70–99)
Glucose-Capillary: 117 mg/dL — ABNORMAL HIGH (ref 70–99)
Glucose-Capillary: 134 mg/dL — ABNORMAL HIGH (ref 70–99)

## 2013-08-22 LAB — CBC
HCT: 31 % — ABNORMAL LOW (ref 36.0–46.0)
Hemoglobin: 10.3 g/dL — ABNORMAL LOW (ref 12.0–15.0)
MCH: 23 pg — ABNORMAL LOW (ref 26.0–34.0)
MCHC: 33.2 g/dL (ref 30.0–36.0)
RBC: 4.48 MIL/uL (ref 3.87–5.11)

## 2013-08-22 MED ORDER — DIPHENHYDRAMINE HCL 25 MG PO CAPS: 25.0000 mg | ORAL_CAPSULE | Freq: Every evening | ORAL | Status: DC | PRN

## 2013-08-22 NOTE — Progress Notes (Signed)
Notified IV team RN that pt was being transferred from Rm 11 to 4E Rm 01 with a R Subclavian triple lumen CVC.  Distal port infusing 20 cc NS/ hr.  Dressing change due 08/25/13.

## 2013-08-22 NOTE — Progress Notes (Signed)
PULMONARY  / CRITICAL CARE MEDICINE  Name: Andrea [name] MRN: [id number] DOB: February 26, 1951    ADMISSION DATE:  08/19/2013  REFERRING MD :  EDP PRIMARY SERVICE: PCCM  CHIEF COMPLAINT:  SOB  BRIEF PATIENT DESCRIPTION: 62 yo female with hx CHF presented to [facility] ED 8/20 with SOB and AMS requiring (nasal) intubation by EMS.  In ER pt hypoxic, CXR c/w CHF.  PCCM called to admit.   SIGNIFICANT EVENTS / STUDIES:  8/20 Echo: The cavity size was normal. Wall thickness was increased in a pattern of mild LVH. The estimated ejection fraction was 20%. Diffuse hypokinesis  8/21 elevated PCT. Development of LLL consolidation. Positive BC. Working dx: CAP 8/21: Passed SBT. Cognition fully intact. Extubated  LINES / TUBES: Nasal ETT 8/20 >> 8/21 R [place] CVL 8/20 >>    CULTURES: PCT 8/20-8/22 >> 4.49, 10.62,    Urine 8/20 >> NEG Resp 8/20 >> neg BCx2 8/20 >> 1/2 staph species, NOS >>    ANTIBIOTICS: Cefepime 8/20 >> 8/22 Vancomycin 8/20 >>  Levofloxacin 8/22 >>   VITAL SIGNS: Temp:  [97.6 F (36.4 C)-98.5 F (36.9 C)] 98 F (36.7 C) (08/23 0739) Pulse Rate:  [93-115] 99 (08/23 0300) Resp:  [17-29] 20 (08/23 0300) BP: (65-131)/(38-103) 114/72 mmHg (08/23 0155) SpO2:  [92 %-100 %] 92 % (08/23 0740)       INTAKE / OUTPUT: Intake/Output     08/22 0701 - 08/23 0700 08/23 0701 - 08/24 0700   P.O.     I.V. (mL/kg) 420 (4.8)    IV Piggyback 500    Total Intake(mL/kg) 920 (10.5)    Urine (mL/kg/hr) 550 (0.3)    Total Output 550     Net +370          Urine Occurrence 2 x      PHYSICAL EXAMINATION: General:  RASS 0. + F/C. No resp distress  Neuro:  MAEs, no focal deficits HEENT:  WNL Cardiovascular:  Tachy, reg, no M  Lungs:  Clear anteriorly without wheezes, diminished in L base Abdomen:  Obese, soft, +bs Musculoskeletal:  Warm and dry, no edema   LABS:  CBC Recent Labs     08/20/13  0500  08/21/13  0500  08/22/13  0435  WBC  17.3*  11.1*  7.0  HGB  13.8  12.5  10.3*   HCT  40.0  37.1  31.0*  PLT  168  129*  116*   Coag's Recent Labs     08/19/13  1014  APTT  25  INR  1.07   BMET Recent Labs     08/19/13  2330  08/20/13  0500  08/21/13  0500  NA  141  141  135  K  3.4*  3.5  3.7  CL  99  100  97  CO2  27  27  30   BUN  16  17  25*  CREATININE  0.87  0.94  0.98  GLUCOSE  158*  151*  159*   Electrolytes Recent Labs     08/19/13  2330  08/20/13  0500  08/21/13  0500  CALCIUM  9.2  9.5  9.0   Sepsis Markers Recent Labs     08/19/13  1148  08/20/13  0500  08/21/13  0500  PROCALCITON  4.49  10.62  7.49   ABG Recent Labs     08/19/13  1035  08/19/13  1559  08/20/13  0343  PHART  7.198*  7.365  7.441  PCO2ART  59.0*  44.5  39.1  PO2ART  104.0*  174.0*  136.0*   Liver Enzymes Recent Labs     08/19/13  1014  AST  39*  ALT  18  ALKPHOS  60  BILITOT  0.4  ALBUMIN  3.8   Cardiac Enzymes Recent Labs     08/19/13  1024  08/19/13  1300  08/19/13  2329  08/20/13  0058  TROPONINI   --   <0.30  <0.30  <0.30  PROBNP  1769.0*   --    --    --    Glucose Recent Labs     08/21/13  1141  08/21/13  1624  08/21/13  1815  08/21/13  2121  08/22/13  0051  08/22/13  0720  GLUCAP  125*  128*  98  117*  134*  100*    CXR: improved aeration  ASSESSMENT / PLAN:  PULMONARY Acute respiratory failure, resolved Pulm edema, mild  Community acquired PNA, NOS Former smoker - no known hx COPD P:   Transfer to [employer] supplemental O2 to maintain SpO2 > 93% Cont nebulized BDs  CARDIOVASCULAR Severe nonischemic CM Reactive tachycardia, resolved P:  PRN metoprolol to maintain HR < 110/min Cont carvedilol but lower doses Holding lisinopril as BP marginally low  RENAL Acute renal insufficiency, resolved   AG metab acidosis/ Lactic acidosis, resolved P:   Monitor BMET intermittently Correct electrolytes as indicated   GASTROINTESTINAL No active issue  P:   Cont CHO mod diet  HEMATOLOGIC Mild  thrombocytopenia P:  Monitor CBC intermittently  INFECTIOUS Suspect LLL CAP Elevated PCT 1/2 positive BC, staph species - probable contaminant P:   Change cefepime to levofloxacin Cont Vanc pending final ID - if coag neg staph, would DC  If S aureus, follow up sensitivities and adjust abx accordingly  ENDOCRINE DM II, controlled P:   Cont SSI   NEUROLOGIC Acute encephalopathy, resolved  Post extubation weakness P:   Minimize sedation PT consult 8/22  Transfer to tele 8/23. Will remains on PCCM as likely DC home soon    Caryl Bis  [phone]  Cell  [phone]  If no response or cell goes to voicemail, call beeper [phone] 08/22/2013

## 2013-08-22 NOTE — Progress Notes (Signed)
Received to unit via wheelchair.  Education reinforced regarding CHF.  Heart Failure booklet given.

## 2013-08-22 NOTE — Progress Notes (Signed)
Pt is a 62 yo with hx of CAD, dilated CM ( EF of 30%),  CHF admitted on 8/20 with respiratory failure.     Filed Vitals:   08/22/13 0800 08/22/13 0845 08/22/13 0900 08/22/13 1014  BP:  120/68  104/65  Pulse: 104 111 108 104  Temp:    98.1 F (36.7 C)  TempSrc:    Oral  Resp: 21 21 21 20   Height:    5\' 2"  (1.575 m)  Weight:    192 lb 6.4 oz (87.272 kg)  SpO2: 97% 95% 93% 97%    Intake/Output Summary (Last 24 hours) at 08/22/13 1112 Last data filed at 08/22/13 1000  Gross per 24 hour  Intake    960 ml  Output   1350 ml  Net   -390 ml    SUBJECTIVE Extubated. Complained of coughing all night. Still some SOB. Feels sleepy.  LABS: Basic Metabolic Panel:  Recent Labs  16/10/96 0500 08/21/13 0500  NA 141 135  K 3.5 3.7  CL 100 97  CO2 27 30  GLUCOSE 151* 159*  BUN 17 25*  CREATININE 0.94 0.98  CALCIUM 9.5 9.0   Liver Function Tests: No results found for this basename: AST, ALT, ALKPHOS, BILITOT, PROT, ALBUMIN,  in the last 72 hours No results found for this basename: LIPASE, AMYLASE,  in the last 72 hours CBC:  Recent Labs  08/21/13 0500 08/22/13 0435  WBC 11.1* 7.0  HGB 12.5 10.3*  HCT 37.1 31.0*  MCV 69.0* 69.2*  PLT 129* 116*   Cardiac Enzymes:  Recent Labs  08/19/13 1300 08/19/13 2329 08/20/13 0058  TROPONINI <0.30 <0.30 <0.30   BNP: 1769  Hemoglobin A1C:  Recent Labs  08/19/13 1330  HGBA1C 6.4*   Fasting Lipid Panel:  Recent Labs  08/20/13 0500  CHOL 154  HDL 53  LDLCALC 68  TRIG 166*  CHOLHDL 2.9    Radiology/Studies:  Dg Chest Port 1 View  08/20/2013   *RADIOLOGY REPORT*  Clinical Data: Hypoxia  PORTABLE CHEST - 1 VIEW  Comparison:  August 19, 2013  Findings:   Endotracheal tube tip is 1.8 cm above the carina. Central catheter tip is in the right atrium, stable.  No pneumothorax.  Pacemaker lead is attached to the ventricle.  There is consolidation throughout the left lower lobe with small left effusion.  Lungs elsewhere  clear.  Heart is mildly enlarged with pulmonary venous hypertension.  No adenopathy.  IMPRESSION: Suspect a degree of congestive heart failure.  Superimposed consolidation left lower lobe.  No pneumothorax.   Original Report Authenticated By: Bretta Bang, M.D.   Dg Chest Port 1 View  08/19/2013   *RADIOLOGY REPORT*  Clinical Data: Central line placement.  PORTABLE CHEST - 1 VIEW  Comparison: 08/19/2013.  Findings: AICD device unchanged and stable.  Cardiomegaly with mild vascular congestion.  ET tube unchanged. Prior cervical fusion.  A right central venous catheter has been placed without pneumothorax.  Tip lies 1-2 cm below the cavoatrial junction. Repeat radiograph after slight repositioning is recommended.  IMPRESSION: Central venous catheter tip slightly low within the right atrium. Recommend withdrawal 1-2 cm and repeat radiograph.   Original Report Authenticated By: Davonna Belling, M.D.   Dg Chest Portable 1 View  08/19/2013   *RADIOLOGY REPORT*  Clinical Data: Respiratory stress  PORTABLE CHEST - 1 VIEW  Comparison: None.  Findings: The cardiac shadow is mildly enlarged.  A defibrillator is noted.  An endotracheal tube is seen approximately 4  cm above the carina.  Central vascular congestion is noted.  Some very mild interstitial edema is seen.  IMPRESSION: Mild CHF.   Original Report Authenticated By: Alcide Clever, M.D.   Ecg: sinus tachy, LVH with repolarization abnormality.  Echo:Study Conclusions  - Left ventricle: The cavity size was normal. Wall thickness was increased in a pattern of mild LVH. The estimated ejection fraction was 20%. Diffuse hypokinesis. There is akinesis of the mid-distalanteroseptal myocardium. The study is not technically sufficient to allow evaluation of LV diastolic function. - Mitral valve: Mild regurgitation. - Pulmonary arteries: PA peak pressure: 35mm Hg (S).  PHYSICAL EXAM General: Well developed, well nourished, NAD Head: Normal Neck: Negative for  carotid bruits. JVD not elevated. Lungs: Coarse BS. Bilateral rhonchi/ wheezing.   Breathing is unlabored. Heart: RRR S1 S2 without murmurs, rubs, or gallops.  Abdomen: Soft, non-tender, non-distended with normoactive bowel sounds. No hepatomegaly. No rebound/guarding. No obvious abdominal masses. Msk:  Strength and tone appears normal for age. Extremities: No clubbing, cyanosis or edema.  Distal pedal pulses are 2+ and equal bilaterally. Neuro: Alert and oriented X 3. Moves all extremities spontaneously. Psych:  Responds to questions appropriately with a normal affect.  ASSESSMENT AND PLAN: 1. Acute on chronic systolic CHF-EF 20%.    Continue coreg 3.125 BID,   She was on lasix but it appears to have been stopped.  She is not on ACE inhibitor due to hypotension in the past due to ACE-I.   2. CAP on IV antibiotics. 1 of 2 BC positive for gram positive cocci in clusters. 3. Vent dependent respiratory failure. Now extubated.   4. CAD s/p DES of distal RCA 2011, proximal RCA in 2012, and mid RCA in 4/14. Need to continue antiplatelet therapy with Plavix. History of intolerance to ASA and Brilinta.  5. HTN 6. S/p ICD  Patient admitted under a new medical record number. All prior records are present in Epic under old medical record # [id number].  Active Problems:   Acute respiratory failure with hypoxia   Acute systolic CHF (congestive heart failure)   Encephalopathy acute   CAP (community acquired pneumonia)   Positive blood culture   Cardiomyopathy   DM2 (diabetes mellitus, type 2)    Signed, Elyn Aquas. MD,FACC 08/22/2013 11:12 AM

## 2013-08-22 NOTE — Progress Notes (Signed)
ANTIBIOTIC CONSULT NOTE - FOLLOW UP  Pharmacy Consult for vancomycin Indication: sepsis  Allergies  Allergen Reactions  . Asa [Aspirin] Shortness Of Breath  . Diovan [Valsartan] Other (See Comments)    unknown  . Nsaids Other (See Comments)    GI upset     Patient Measurements: Height: 5\' 2"  (157.5 cm) Weight: 192 lb 6.4 oz (87.272 kg) (Scale A) IBW/kg (Calculated) : 50.1   Vital Signs: Temp: 98.1 F (36.7 C) (08/23 1014) Temp src: Oral (08/23 1014) BP: 104/65 mmHg (08/23 1014) Pulse Rate: 104 (08/23 1014) Intake/Output from previous day: 08/22 0701 - 08/23 0700 In: 920 [I.V.:420; IV Piggyback:500] Out: 550 [Urine:550] Intake/Output from this shift: Total I/O In: 120 [I.V.:120] Out: 800 [Urine:800]  Labs:  Recent Labs  08/19/13 2330 08/20/13 0500 08/21/13 0500 08/22/13 0435  WBC  --  17.3* 11.1* 7.0  HGB  --  13.8 12.5 10.3*  PLT  --  168 129* 116*  CREATININE 0.87 0.94 0.98  --    Estimated Creatinine Clearance: 61.1 ml/min (by C-G formula based on Cr of 0.98).  Recent Labs  08/22/13 1130  VANCOTROUGH 18.8     Microbiology: Recent Results (from the past 720 hour(s))  CULTURE, BLOOD (ROUTINE X 2)     Status: None   Collection Time    08/19/13 10:14 AM      Result Value Range Status   Specimen Description BLOOD LEFT ANTECUBITAL   Final   Special Requests BOTTLES DRAWN AEROBIC ONLY 5CC   Final   Culture  Setup Time     Final   Value: 08/19/2013 14:08     Performed at [facility]   Culture     Final   Value: STAPHYLOCOCCUS SPECIES (COAGULASE NEGATIVE)     Note: THE SIGNIFICANCE OF ISOLATING THIS ORGANISM FROM A SINGLE SET OF BLOOD CULTURES WHEN MULTIPLE SETS ARE DRAWN IS UNCERTAIN. PLEASE NOTIFY THE MICROBIOLOGY DEPARTMENT WITHIN ONE WEEK IF SPECIATION AND SENSITIVITIES ARE REQUIRED.     Note: Gram Stain Report Called to,Read Back By and Verified With: LINDA B @ [phone] 08/20/13 BY KRAWS     Performed at [facility]   Report Status  08/22/2013 FINAL   Final  URINE CULTURE     Status: None   Collection Time    08/19/13 10:29 AM      Result Value Range Status   Specimen Description URINE, CATHETERIZED   Final   Special Requests NONE   Final   Culture  Setup Time     Final   Value: 08/19/2013 11:40     Performed at [facility] Count     Final   Value: NO GROWTH     Performed at [facility]   Culture     Final   Value: NO GROWTH     Performed at [facility]   Report Status 08/20/2013 FINAL   Final  CULTURE, BLOOD (ROUTINE X 2)     Status: None   Collection Time    08/19/13 11:10 AM      Result Value Range Status   Specimen Description BLOOD RIGHT HAND   Final   Special Requests BOTTLES DRAWN AEROBIC ONLY 10CC   Final   Culture  Setup Time     Final   Value: 08/19/2013 14:08     Performed at [facility]   Culture     Final   Value:  BLOOD CULTURE RECEIVED NO GROWTH TO DATE CULTURE WILL BE HELD FOR 5 DAYS BEFORE ISSUING A FINAL NEGATIVE REPORT     Performed at [facility]   Report Status PENDING   Incomplete  MRSA PCR SCREENING     Status: None   Collection Time    08/19/13 12:41 PM      Result Value Range Status   MRSA by PCR NEGATIVE  NEGATIVE Final   Comment:            The GeneXpert MRSA Assay (FDA     approved for NASAL specimens     only), is one component of a     comprehensive MRSA colonization     surveillance program. It is not     intended to diagnose MRSA     infection nor to guide or     monitor treatment for     MRSA infections.    Anti-infectives   Start     Dose/Rate Route Frequency Ordered Stop   08/21/13 1200  levofloxacin (LEVAQUIN) tablet 500 mg     500 mg Oral Daily 08/21/13 0953     08/20/13 1200  vancomycin (VANCOCIN) IVPB 1000 mg/200 mL premix  Status:  Discontinued     1,000 mg 200 mL/hr over 60 Minutes Intravenous Every 12 hours 08/20/13 0955 08/22/13 1305   08/20/13 0000  vancomycin (VANCOCIN) IVPB 750 mg/150 ml  premix  Status:  Discontinued     750 mg 150 mL/hr over 60 Minutes Intravenous Every 12 hours 08/19/13 1141 08/20/13 0955   08/19/13 2200  ceFEPIme (MAXIPIME) 1 g in dextrose 5 % 50 mL IVPB  Status:  Discontinued     1 g 100 mL/hr over 30 Minutes Intravenous 3 times per day 08/19/13 1141 08/21/13 1009   08/19/13 1030  ceFEPIme (MAXIPIME) 2 g in dextrose 5 % 50 mL IVPB     2 g 100 mL/hr over 30 Minutes Intravenous  Once 08/19/13 1024 08/19/13 1200   08/19/13 1030  vancomycin (VANCOCIN) IVPB 1000 mg/200 mL premix     1,000 mg 200 mL/hr over 60 Minutes Intravenous  Once 08/19/13 1024 08/19/13 1300      Assessment: Andrea [name] now transferred to tele bed who is on Levaquin dosing per MD and vancomycin per pharmacy. Vanc trough this morning was therapeutic at 18.89mcg/mL. Blood cultures grew 1/2 coag neg staph, concerned there is a pneumonia present. Spoke with Dr. Delford Field and he would like to only continue treating with Levaquin- vanc is not needed as evidenced by blood cultures. WBC 7, afebrile  Goal of Therapy:  Vancomycin trough level 15-20 mcg/ml  Plan:   1. Discontinue vancomycin 2. Continue Levaquin 500mg  PO Q24 3. Pharmacy to sign off  Phuong Hillary D. Darrious Youman, PharmD Clinical Pharmacist Pager: [phone] 08/22/2013 1:08 PM

## 2013-08-22 NOTE — Progress Notes (Signed)
Physical Therapy Treatment Patient Details Name: Andrea [name] MRN: [id number] DOB: 1951-11-20 Today's Date: 08/22/2013 Time: 2956-2130 PT Time Calculation (min): 15 min  PT Assessment / Plan / Recommendation  History of Present Illness 62 yo female with hx CHF presented to [facility] ED 8/20 with SOB and AMS requiring (nasal) intubation by EMS.  In ER pt hypoxic, CXR c/w CHF   PT Comments   Pt is progressing with mobility, ambulating at increased pace. Still experiencing SOB with ambulation but O2 sats remained above 90% on RA. Pt does not feel that she is back to baseline energy level, would benefit from [facility] to assist with daily activities. PT will continue to follow.   Follow Up Recommendations  No PT follow up (pt would benefit from [facility])     Does the patient have the potential to tolerate intense rehabilitation     Barriers to Discharge        Equipment Recommendations  None recommended by PT    Recommendations for Other Services    Frequency Min 3X/week   Progress towards PT Goals Progress towards PT goals: Progressing toward goals  Plan Current plan remains appropriate    Precautions / Restrictions Precautions Precautions: Fall Restrictions Weight Bearing Restrictions: No   Pertinent Vitals/Pain No c/o pain    Mobility  Bed Mobility Bed Mobility: Not assessed (pt sitting EOB) Transfers Transfers: Sit to Stand;Stand to Sit Sit to Stand: 6: Modified independent (Device/Increase time);From bed;Without upper extremity assist Stand to Sit: 7: Independent;To bed Ambulation/Gait Ambulation/Gait Assistance: 5: Supervision Ambulation Distance (Feet): 200 Feet Assistive device: None Ambulation/Gait Assistance Details: pt with increased pace today but resulted in SOB, however, O2 sats remained above 90%. Reminded pt of pursed lip breathing which was effective for her Gait Pattern: Step-through pattern;Decreased stride length Gait velocity: WFL Stairs: No Wheelchair  Mobility Wheelchair Mobility: No    Exercises     PT Diagnosis:    PT Problem List:   PT Treatment Interventions:     PT Goals (current goals can now be found in the care plan section) Acute Rehab PT Goals Patient Stated Goal: return home PT Goal Formulation: With patient Time For Goal Achievement: 08/28/13 Potential to Achieve Goals: Good  Visit Information  Last PT Received On: 08/22/13 Assistance Needed: +1 History of Present Illness: 62 yo female with hx CHF presented to [facility] ED 8/20 with SOB and AMS requiring (nasal) intubation by EMS.  In ER pt hypoxic, CXR c/w CHF    Subjective Data  Subjective: Pt has phlegm stuck in her throat that she can't get out Patient Stated Goal: return home   Cognition  Cognition Arousal/Alertness: Awake/alert Behavior During Therapy: WFL for tasks assessed/performed Overall Cognitive Status: Within Functional Limits for tasks assessed    Balance  Balance Balance Assessed: Yes Dynamic Standing Balance Dynamic Standing - Balance Support: No upper extremity supported;During functional activity Dynamic Standing - Level of Assistance: 6: Modified independent (Device/Increase time)  End of Session PT - End of Session Equipment Utilized During Treatment: Gait belt Activity Tolerance: Patient tolerated treatment well Patient left: in bed;with call bell/phone within reach;with family/visitor present Nurse Communication: Mobility status   GP   Lyanne Co, PT  Acute Rehab Services  [phone]   Lyanne Co 08/22/2013, 4:35 PM

## 2013-08-23 LAB — CBC
MCH: 23.4 pg — ABNORMAL LOW (ref 26.0–34.0)
MCHC: 33.9 g/dL (ref 30.0–36.0)
Platelets: 137 10*3/uL — ABNORMAL LOW (ref 150–400)
RBC: 4.75 MIL/uL (ref 3.87–5.11)
RDW: 16.9 % — ABNORMAL HIGH (ref 11.5–15.5)

## 2013-08-23 LAB — BASIC METABOLIC PANEL
BUN: 20 mg/dL (ref 6–23)
Calcium: 9.7 mg/dL (ref 8.4–10.5)
Creatinine, Ser: 0.85 mg/dL (ref 0.50–1.10)
GFR calc non Af Amer: 72 mL/min — ABNORMAL LOW (ref >90)
Glucose, Bld: 102 mg/dL — ABNORMAL HIGH (ref 70–99)
Sodium: 141 mEq/L (ref 135–145)

## 2013-08-23 LAB — GLUCOSE, CAPILLARY

## 2013-08-23 MED ORDER — METHYLPREDNISOLONE SODIUM SUCC 125 MG IJ SOLR
125.0000 mg | Freq: Once | INTRAMUSCULAR | Status: AC
  Administered 2013-08-23: 125 mg via INTRAVENOUS
  Filled 2013-08-23 (×2): qty 2

## 2013-08-23 MED ORDER — PANTOPRAZOLE SODIUM 40 MG PO TBEC
40.0000 mg | DELAYED_RELEASE_TABLET | Freq: Two times a day (BID) | ORAL | Status: DC
  Administered 2013-08-23 – 2013-08-24 (×2): 40 mg via ORAL
  Filled 2013-08-23 (×2): qty 1

## 2013-08-23 MED ORDER — SODIUM CHLORIDE 0.9 % IJ SOLN
10.0000 mL | INTRAMUSCULAR | Status: DC | PRN
  Administered 2013-08-23 (×2): 20 mL
  Administered 2013-08-24: 30 mL
  Administered 2013-08-24: 20 mL

## 2013-08-23 MED ORDER — BISOPROLOL FUMARATE 5 MG PO TABS
2.5000 mg | ORAL_TABLET | Freq: Two times a day (BID) | ORAL | Status: DC
  Administered 2013-08-23 – 2013-08-24 (×3): 2.5 mg via ORAL
  Filled 2013-08-23 (×4): qty 0.5

## 2013-08-23 MED ORDER — POTASSIUM CHLORIDE CRYS ER 20 MEQ PO TBCR
20.0000 meq | EXTENDED_RELEASE_TABLET | Freq: Two times a day (BID) | ORAL | Status: DC
  Administered 2013-08-23 – 2013-08-25 (×5): 20 meq via ORAL
  Filled 2013-08-23 (×5): qty 1

## 2013-08-23 MED ORDER — DEXTROSE 50 % IV SOLN
INTRAVENOUS | Status: AC
  Filled 2013-08-23: qty 50

## 2013-08-23 MED ORDER — FUROSEMIDE 40 MG PO TABS
40.0000 mg | ORAL_TABLET | Freq: Every day | ORAL | Status: DC
  Administered 2013-08-23 – 2013-08-25 (×3): 40 mg via ORAL
  Filled 2013-08-23 (×3): qty 1

## 2013-08-23 MED ORDER — FAMOTIDINE 20 MG PO TABS
20.0000 mg | ORAL_TABLET | Freq: Every day | ORAL | Status: DC
  Administered 2013-08-23: 20 mg via ORAL
  Filled 2013-08-23 (×2): qty 1

## 2013-08-23 NOTE — Progress Notes (Signed)
Lung sounds diminished and tight.  Nebulizer treatment in process.  Will reassess.

## 2013-08-23 NOTE — Progress Notes (Signed)
Pt is a 62 yo with hx of CAD, dilated CM ( EF of 30%),  CHF admitted on 8/20 with respiratory failure.    Filed Vitals:   08/22/13 1812 08/22/13 2143 08/23/13 0157 08/23/13 0613  BP: 110/60 131/71 137/83 150/72  Pulse: 100 103 107 110  Temp:  98 F (36.7 C) 98.2 F (36.8 C) 98.2 F (36.8 C)  TempSrc:  Oral Oral Oral  Resp:  20 18 18   Height:      Weight:    192 lb 10.9 oz (87.4 kg)  SpO2:  96% 96% 97%    Intake/Output Summary (Last 24 hours) at 08/23/13 1008 Last data filed at 08/23/13 0855  Gross per 24 hour  Intake   1230 ml  Output    775 ml  Net    455 ml    SUBJECTIVE Extubated. Complained of coughing all night. Still some SOB. Feels sleepy.  LABS: Basic Metabolic Panel:  Recent Labs  40/10/27 0500 08/23/13 0440  NA 135 141  K 3.7 3.8  CL 97 103  CO2 30 27  GLUCOSE 159* 102*  BUN 25* 20  CREATININE 0.98 0.85  CALCIUM 9.0 9.7   Liver Function Tests: No results found for this basename: AST, ALT, ALKPHOS, BILITOT, PROT, ALBUMIN,  in the last 72 hours No results found for this basename: LIPASE, AMYLASE,  in the last 72 hours CBC:  Recent Labs  08/22/13 0435 08/23/13 0440  WBC 7.0 6.8  HGB 10.3* 11.1*  HCT 31.0* 32.7*  MCV 69.2* 68.8*  PLT 116* 137*   Cardiac Enzymes: No results found for this basename: CKTOTAL, CKMB, CKMBINDEX, TROPONINI,  in the last 72 hours BNP: 1769  Hemoglobin A1C: No results found for this basename: HGBA1C,  in the last 72 hours Fasting Lipid Panel: No results found for this basename: CHOL, HDL, LDLCALC, TRIG, CHOLHDL, LDLDIRECT,  in the last 72 hours  Radiology/Studies:  Dg Chest Port 1 View  08/20/2013   *RADIOLOGY REPORT*  Clinical Data: Hypoxia  PORTABLE CHEST - 1 VIEW  Comparison:  August 19, 2013  Findings:   Endotracheal tube tip is 1.8 cm above the carina. Central catheter tip is in the right atrium, stable.  No pneumothorax.  Pacemaker lead is attached to the ventricle.  There is consolidation throughout the  left lower lobe with small left effusion.  Lungs elsewhere clear.  Heart is mildly enlarged with pulmonary venous hypertension.  No adenopathy.  IMPRESSION: Suspect a degree of congestive heart failure.  Superimposed consolidation left lower lobe.  No pneumothorax.   Original Report Authenticated By: Bretta Bang, M.D.   Dg Chest Port 1 View  08/19/2013   *RADIOLOGY REPORT*  Clinical Data: Central line placement.  PORTABLE CHEST - 1 VIEW  Comparison: 08/19/2013.  Findings: AICD device unchanged and stable.  Cardiomegaly with mild vascular congestion.  ET tube unchanged. Prior cervical fusion.  A right central venous catheter has been placed without pneumothorax.  Tip lies 1-2 cm below the cavoatrial junction. Repeat radiograph after slight repositioning is recommended.  IMPRESSION: Central venous catheter tip slightly low within the right atrium. Recommend withdrawal 1-2 cm and repeat radiograph.   Original Report Authenticated By: Davonna Belling, M.D.   Dg Chest Portable 1 View  08/19/2013   *RADIOLOGY REPORT*  Clinical Data: Respiratory stress  PORTABLE CHEST - 1 VIEW  Comparison: None.  Findings: The cardiac shadow is mildly enlarged.  A defibrillator is noted.  An endotracheal tube is seen approximately 4 cm  above the carina.  Central vascular congestion is noted.  Some very mild interstitial edema is seen.  IMPRESSION: Mild CHF.   Original Report Authenticated By: Alcide Clever, M.D.   Ecg: sinus tachy, LVH with repolarization abnormality.  Echo:Study Conclusions  - Left ventricle: The cavity size was normal. Wall thickness was increased in a pattern of mild LVH. The estimated ejection fraction was 20%. Diffuse hypokinesis. There is akinesis of the mid-distalanteroseptal myocardium. The study is not technically sufficient to allow evaluation of LV diastolic function. - Mitral valve: Mild regurgitation. - Pulmonary arteries: PA peak pressure: 35mm Hg (S).  PHYSICAL EXAM General: Well  developed, well nourished, NAD Head: Normal Neck: Negative for carotid bruits. JVD not elevated. Lungs: Coarse BS. Bilateral rhonchi/ wheezing.   Breathing is unlabored. Heart: RRR S1 S2 without murmurs, rubs, or gallops.  Abdomen: Soft, non-tender, non-distended with normoactive bowel sounds. No hepatomegaly. No rebound/guarding. No obvious abdominal masses. Msk:  Strength and tone appears normal for age. Extremities: No clubbing, cyanosis or edema.  Distal pedal pulses are 2+ and equal bilaterally. Neuro: Alert and oriented X 3. Moves all extremities spontaneously. Psych:  Responds to questions appropriately with a normal affect.  ASSESSMENT AND PLAN: 1. Acute on chronic systolic CHF-EF 20%.    Dr. Sherene Sires changed coreg to bisoprolol   She was on lasix but it appears to have been stopped.  She is not on ACE inhibitor due to hypotension in the past due to ACE-I. He BP is up today.  Will add lasix 40 a day to her medical regmine.   2. CAP on IV antibiotics. 1 of 2 BC positive for gram positive cocci in clusters. 3. Vent dependent respiratory failure. Now extubated.   4. CAD s/p DES of distal RCA 2011, proximal RCA in 2012, and mid RCA in 4/14. Need to continue antiplatelet therapy with Plavix. History of intolerance to ASA and Brilinta.  5. HTN 6. S/p ICD  Patient admitted under a new medical record number. All prior records are present in Epic under old medical record # [id number].  Active Problems:   Acute respiratory failure with hypoxia   Acute systolic CHF (congestive heart failure)   Encephalopathy acute   CAP (community acquired pneumonia)   Positive blood culture   Cardiomyopathy   DM2 (diabetes mellitus, type 2)    Signed, Elyn Aquas. MD,FACC 08/23/2013 10:08 AM

## 2013-08-23 NOTE — Progress Notes (Signed)
PULMONARY  / CRITICAL CARE MEDICINE  Name: Andrea [name] MRN: [id number] DOB: [birth year]    ADMISSION DATE:  08/19/2013  REFERRING MD :  EDP PRIMARY SERVICE: PCCM   CHIEF COMPLAINT:  SOB  BRIEF PATIENT DESCRIPTION: 21 yobf  with hx CHF presented to [facility] ED 8/20 with SOB and AMS requiring (nasal) intubation by EMS.  In ER pt hypoxic, CXR c/w CHF.  PCCM called to admit.   SIGNIFICANT EVENTS / STUDIES:  8/20 Echo: The cavity size was normal. Wall thickness was increased in a pattern of mild LVH. The estimated ejection fraction was 20%. Diffuse hypokinesis  8/21 elevated PCT. Development of LLL consolidation. Positive BC. Working dx: CAP 8/21: Passed SBT. Cognition fully intact. Extubated  LINES / TUBES: Nasal ETT 8/20 >> 8/21 R [place] CVL 8/20 >>    CULTURES: PCT 8/20-8/22 >> 4.49, 10.62,    Urine 8/20 >> Neg Resp 8/20 >> neg BCx2 8/20 >> 1/2 staph species, NOS > coag neg staph    ANTIBIOTICS: Cefepime 8/20 >> 8/22 Vancomycin 8/20 >> 8/23 Levofloxacin 8/22 >>   VITAL SIGNS: Temp:  [98 F (36.7 C)-98.7 F (37.1 C)] 98.2 F (36.8 C) (08/24 4098) Pulse Rate:  [100-110] 110 (08/24 0613) Resp:  [18-20] 18 (08/24 0613) BP: (104-150)/(58-83) 150/72 mmHg (08/24 0613) SpO2:  [94 %-98 %] 97 % (08/24 0613) Weight:  [192 lb 6.4 oz (87.272 kg)-192 lb 10.9 oz (87.4 kg)] 192 lb 10.9 oz (87.4 kg) (08/24 0613) 02 rx:   RA      INTAKE / OUTPUT: Intake/Output     08/23 0701 - 08/24 0700 08/24 0701 - 08/25 0700   P.O. 840 220   I.V. (mL/kg) 290 (3.3)    IV Piggyback     Total Intake(mL/kg) 1130 (12.9) 220 (2.5)   Urine (mL/kg/hr) 1275 (0.6) 300 (1.3)   Total Output 1275 300   Net -145 -80        Urine Occurrence 550 x      PHYSICAL EXAMINATION: General:  Sitting on side of bed, very hoarse with pseudowheeze Neuro:  MAEs, no focal deficits HEENT:  WNL Cardiovascular:  Tachy, reg, no M  Lungs:  Clear anteriorly without wheezes, diminished in L base Abdomen:  Obese, soft,  +bs Musculoskeletal:  Warm and dry, no edema   LABS:  CBC Recent Labs     08/21/13  0500  08/22/13  0435  08/23/13  0440  WBC  11.1*  7.0  6.8  HGB  12.5  10.3*  11.1*  HCT  37.1  31.0*  32.7*  PLT  129*  116*  137*   Coag's No results found for this basename: APTT, INR,  in the last 72 hours BMET Recent Labs     08/21/13  0500  08/23/13  0440  NA  135  141  K  3.7  3.8  CL  97  103  CO2  30  27  BUN  25*  20  CREATININE  0.98  0.85  GLUCOSE  159*  102*   Electrolytes Recent Labs     08/21/13  0500  08/23/13  0440  CALCIUM  9.0  9.7   Sepsis Markers Recent Labs     08/21/13  0500  PROCALCITON  7.49   ABG No results found for this basename: PHART, PCO2ART, PO2ART,  in the last 72 hours Liver Enzymes No results found for this basename: AST, ALT, ALKPHOS, BILITOT, ALBUMIN,  in the last 72 hours Cardiac Enzymes No  results found for this basename: TROPONINI, PROBNP,  in the last 72 hours Glucose Recent Labs     08/22/13  0051  08/22/13  0720  08/22/13  1124  08/22/13  1633  08/22/13  2137  08/23/13  0557  GLUCAP  134*  100*  132*  99  105*  96    CXR:  8/23 reviewed 1. Platelike atelectasis at the lung bases. Left basilar airspace  opacity has improved.  2. Low lung volumes.   ASSESSMENT / PLAN:  PULMONARY Acute respiratory failure, resolved Pulm edema, mild  Community acquired PNA, NOS Former smoker - no known hx COPD Pseudowheeze present 8/24  P:   Transfer to med-surg Cont supplemental O2 to maintain SpO2 > 93% Cont nebulized BDs Max rx for GERD/ ? VCD related to ET inflammation  CARDIOVASCULAR Severe nonischemic CM Reactive tachycardia, resolved P:  PRN metoprolol to maintain HR < 110/min Change carvedilol to bisoprolol low doses ACEi very risky given tenuous upper airway status  RENAL Acute renal insufficiency, resolved   AG metab acidosis/ Lactic acidosis, resolved P:   Monitor BMET intermittently Correct electrolytes as  indicated   GASTROINTESTINAL No active issue  P:   Cont CHO mod diet  HEMATOLOGIC Lab Results  Component Value Date   PLT 137* 08/23/2013   PLT 116* 08/22/2013   PLT 129* 08/21/2013    Mild thrombocytopenia, trending up P:  Monitor CBC intermittently   INFECTIOUS Suspect LLL CAP Elevated PCT 1/2 positive BC, staph epi - probable contaminant P:   rx per dashboard    ENDOCRINE DM II, controlled P:   Cont SSI     NEUROLOGIC Acute encephalopathy, resolved  Post extubation weakness P:   Minimize sedation PT consult 8/22      Andrea Hughs, MD Pulmonary and Critical Care Medicine [facility] Cell [phone] After 5:30 PM or weekends, call [phone]

## 2013-08-24 ENCOUNTER — Inpatient Hospital Stay (HOSPITAL_COMMUNITY): Payer: BC Managed Care – PPO

## 2013-08-24 ENCOUNTER — Encounter (HOSPITAL_COMMUNITY): Payer: Self-pay | Admitting: Internal Medicine

## 2013-08-24 DIAGNOSIS — Z95810 Presence of automatic (implantable) cardiac defibrillator: Secondary | ICD-10-CM

## 2013-08-24 DIAGNOSIS — J811 Chronic pulmonary edema: Secondary | ICD-10-CM

## 2013-08-24 DIAGNOSIS — J90 Pleural effusion, not elsewhere classified: Secondary | ICD-10-CM | POA: Insufficient documentation

## 2013-08-24 LAB — GLUCOSE, CAPILLARY

## 2013-08-24 MED ORDER — BISOPROLOL FUMARATE 5 MG PO TABS
5.0000 mg | ORAL_TABLET | Freq: Two times a day (BID) | ORAL | Status: DC
  Administered 2013-08-24 – 2013-08-25 (×2): 5 mg via ORAL
  Filled 2013-08-24 (×3): qty 1

## 2013-08-24 MED ORDER — PANTOPRAZOLE SODIUM 40 MG PO TBEC
40.0000 mg | DELAYED_RELEASE_TABLET | Freq: Every day | ORAL | Status: DC
  Administered 2013-08-25: 40 mg via ORAL

## 2013-08-24 MED ORDER — POTASSIUM CHLORIDE CRYS ER 20 MEQ PO TBCR
40.0000 meq | EXTENDED_RELEASE_TABLET | Freq: Once | ORAL | Status: AC
  Administered 2013-08-24: 40 meq via ORAL
  Filled 2013-08-24: qty 2

## 2013-08-24 MED ORDER — FUROSEMIDE 10 MG/ML IJ SOLN
40.0000 mg | Freq: Once | INTRAMUSCULAR | Status: AC
  Administered 2013-08-24: 40 mg via INTRAVENOUS
  Filled 2013-08-24: qty 4

## 2013-08-24 NOTE — Discharge Summary (Signed)
Physician Discharge Summary     Patient ID: Andrea [name] MRN: [id number] DOB/AGE: 05-02-1951 62 y.o.  Admit date: 08/19/2013 Discharge date: 08/25/2013  Discharge Diagnoses:  CAP (NOS) and decompensated systolic HF w/ pulmonary edema (resolved).  Pseudowheeze present 8/24, she actively reports GERD. This may be a mix of reflux and post-intubation trauma.  Severe nonischemic CM  CAD  Mild thrombocytopenia 1/2 positive BC, staph epi - probable contaminant  DM II Post extubation weakness/ deconditioning  Acute respiratory failure, in setting of CAP (NOS) and decompensated systolic HF w/ pulmonary edema (resolved).  Former smoker - no known hx COPD  Acute encephalopathy  Reactive tachycardia  Acute renal insufficiency  AG metab acidosis/ Lactic acidosis Pleural effusion  Detailed Hospital Course:    62 yo female with unknown PMH besides CHF, called 911 this am r/t SOB. When firemen arrived, she was very SOB with sats 70's. She deteriorated quickly, became unresponsive and required nasal intubation (intubated with no meds).Denied cough, fevers, chest pain, purulent sputum, urinary symptoms, BLE edema, orthopnea, leg/calf pain, recent travel or sick contacts.   Was admitted to the ICU for decompensated systolic heart failure with pulmonary edema. We did treat for possible CAP which no organism was specified. Perhaps this may have been what triggered her CHF decompensation. Therapeutic interventions included: mechanical ventilation, IV antibiotics, and lasix. ECHO obtained showed EF 20% and diffusely hypokinetic. Cardiology was consulted and additional recommendations were made. She was extubated on 8/21. Her oxygen was weaned to off. She did have significant upper airway irritation and cough and because of this Dr Sherene Sires changed her coreg to bisoprolol, and stopped Ace-I given concern about upper airway. She had f/u CXR on 8/25 that showed some increase in pleural effusions. Because of this  we gave extra dose of lasix. F/U CXR on 8/26 showed mix of effusion and atelectasis. Gave extra dose of lasix. F/u 2 view on 8/27 showed improved aeration and basilar atelectasis. Final cardiology recs were as follows: cont current lasix and bisoprolol  On day of discharge she feels close to baseline. She walked in hall on room air without significant desaturation event.    Discharge Plan by diagnoses   CAP (NOS) and decompensated systolic HF w/ pulmonary edema (resolved).  Pseudowheeze present 8/24, she actively reports GERD. This may be a mix of reflux and post-intubation trauma.  P:  Cont lasix  Max rx for GERD/ ? VCD related to ET inflammation  Nasal saline  F/u w/ Dr Swaz[place] (office will contact)  Severe nonischemic CM  CAD  P:   bisoprolol low doses  ACEi very risky given tenuous upper airway status so stopped.  Cont plavix  F/u with cardiology in out-pt   DM II, controlled  P:  will go home on usual regimen   Post extubation weakness/ deconditioning  P:  PT consult 8/22, decide on [facility] PT  Significant Hospital tests/ studies/ interventions and procedures  Consults Cardiology   Discharge Exam: BP 114/68  Pulse 99  Temp(Src) 97.5 F (36.4 C) (Oral)  Resp 19  Ht 5\' 2"  (1.575 m)  Wt 85.458 kg (188 lb 6.4 oz)  BMI 34.45 kg/m2  SpO2 95% Room air  PHYSICAL EXAMINATION:  General: Dyspnea with mild exertion, NAD @ rest  Neuro: MAEs, no focal deficits  HEENT: WNL  Cardiovascular: Tachy, reg, no M  Lungs: Clear anteriorly without wheezes, crackles in bases.  Abdomen: Obese, soft, +bs  Musculoskeletal: Warm and dry, no edema    Labs at discharge  Lab Results  Component Value Date   CREATININE 1.18* 08/25/2013   BUN 34* 08/25/2013   NA 142 08/25/2013   K 4.4 08/25/2013   CL 103 08/25/2013   CO2 28 08/25/2013   Lab Results  Component Value Date   WBC 6.8 08/23/2013   HGB 11.1* 08/23/2013   HCT 32.7* 08/23/2013   MCV 68.8* 08/23/2013   PLT 137* 08/23/2013   Lab Results   Component Value Date   ALT 18 08/19/2013   AST 39* 08/19/2013   ALKPHOS 60 08/19/2013   BILITOT 0.4 08/19/2013   Lab Results  Component Value Date   INR 1.07 08/19/2013    Current radiology studies Dg Chest 2 View  08/25/2013   *RADIOLOGY REPORT*  Clinical Data: Shortness of breath and cough  CHEST - 2 VIEW  Comparison: August 24, 2013  Findings: There is a small right pleural effusion, unchanged. There is minimal left pleural effusion, improved.  There is no pulmonary edema.  The heart size is enlarged.  The aorta is tortuous.  Cardiac pacemaker is identified unchanged.  Right central venous line is unchanged.  The soft tissues and osseous structures are stable.  IMPRESSION: No evidence of pulmonary edema.  Small right pleural effusion and minimal left pleural effusion.   Original Report Authenticated By: Sherian Rein, M.D.   Dg Chest Port 1 View  08/24/2013   *RADIOLOGY REPORT*  Clinical Data: Shortness of breath and cough  PORTABLE CHEST - 1 VIEW  Comparison: 08/22/2013  Findings: The defibrillator is again seen and stable.  Stable cardiomegaly is noted.  A right-sided central venous line is again seen and unchanged.  Bilateral pleural effusions are seen which appear new from prior exam.  There is likely underlying atelectasis as well.  IMPRESSION: New bilateral pleural effusions.   Original Report Authenticated By: Alcide Clever, M.D.    Disposition:  Final discharge disposition not confirmed      Discharge Orders   Future Orders Complete By Expires   (HEART FAILURE PATIENTS) Call MD:  Anytime you have any of the following symptoms: 1) 3 pound weight gain in 24 hours or 5 pounds in 1 week 2) shortness of breath, with or without a dry hacking cough 3) swelling in the hands, feet or stomach 4) if you have to sleep on extra pillows at night in order to breathe.  As directed    Call MD for:  persistant dizziness or light-headedness  As directed    Call MD for:  persistant nausea and vomiting   As directed    Call MD for:  temperature >100.4  As directed    Diet - low sodium heart healthy  As directed    Increase activity slowly  As directed        Medication List    STOP taking these medications       carvedilol 6.25 MG tablet  Commonly known as:  COREG     lisinopril 10 MG tablet  Commonly known as:  PRINIVIL,ZESTRIL      TAKE these medications       atorvastatin 40 MG tablet  Commonly known as:  LIPITOR  Take 40 mg by mouth daily.     bisoprolol 5 MG tablet  Commonly known as:  ZEBETA  Take 1 tablet (5 mg total) by mouth 2 (two) times daily.     buPROPion 300 MG 24 hr tablet  Commonly known as:  WELLBUTRIN XL  Take 300 mg by mouth daily.  clopidogrel 75 MG tablet  Commonly known as:  PLAVIX  Take 75 mg by mouth daily.     DULoxetine 60 MG capsule  Commonly known as:  CYMBALTA  Take 60 mg by mouth daily.     furosemide 40 MG tablet  Commonly known as:  LASIX  Take 1 tablet (40 mg total) by mouth daily.     loratadine 10 MG tablet  Commonly known as:  CLARITIN  Take 10 mg by mouth as needed for allergies.     nitroGLYCERIN 0.4 MG SL tablet  Commonly known as:  NITROSTAT  Place 0.4 mg under the tongue every 5 (five) minutes as needed for chest pain.     pantoprazole 40 MG tablet  Commonly known as:  PROTONIX  Take 40 mg by mouth daily.     potassium chloride SA 20 MEQ tablet  Commonly known as:  K-DUR,KLOR-CON  Take 1 tablet (20 mEq total) by mouth daily.     sitaGLIPtin 100 MG tablet  Commonly known as:  JANUVIA  Take 100 mg by mouth daily.     TYLENOL ARTHRITIS PAIN PO  Take 2 tablets by mouth as needed (pain).       Follow-up Information   Follow up with Peter [place], MD. (The office will call. )    Specialty:  Cardiology   Contact information:   [address] [place] [address] [phone]       Schedule an appointment as soon as possible for a visit with see your primary doctor .      Discharged  Condition: good  Physician Statement:   The Patient was personally examined, the discharge assessment and plan has been personally reviewed and I agree with ACNP Babcock's assessment and plan. > 30 minutes of time have been dedicated to discharge assessment, planning and discharge instructions.   SignedAnders Simmonds 08/25/2013, 12:06 PM   Agree  Billy Fischer, MD ; [facility] service Mobile [phone].  After 5:30 PM or weekends, call [phone]

## 2013-08-24 NOTE — Progress Notes (Signed)
Physical Therapy Treatment Patient Details Name: Andrea [name] MRN: [id number] DOB: [birth year] Today's Date: 08/24/2013 Time: 6962-9528 PT Time Calculation (min): 24 min  PT Assessment / Plan / Recommendation  History of Present Illness 62 yo female with hx CHF presented to [facility] ED 8/20 with SOB and AMS requiring (nasal) intubation by EMS.  In ER pt hypoxic, CXR c/w CHF   PT Comments   Pt moves well but she states she does not feel completely back at baseline with strength & balance.  When asked if she would like HHPT she stated she would, therefore d/c plans updated.  Pt also becomes SOB with activity with 02 sats dropping to 89% RA only once but quickly returning back to >90% & remained WNL remainder of distance.      Follow Up Recommendations  Home health PT;  (Would benefit from [facility])     Does the patient have the potential to tolerate intense rehabilitation     Barriers to Discharge        Equipment Recommendations  None recommended by PT    Recommendations for Other Services    Frequency Min 3X/week   Progress towards PT Goals Progress towards PT goals: Progressing toward goals  Plan Discharge plan needs to be updated    Precautions / Restrictions Precautions Precautions: Fall Restrictions Weight Bearing Restrictions: No   Pertinent Vitals/Pain 02 sats:  Dropped to 89% RA briefly but quickly recovered with standing rest break & pursed lip breathing then 02 sats remaining >90% remainder of ambulation.     Mobility  Bed Mobility Bed Mobility: Supine to Sit;Sitting - Scoot to Edge of Bed;Sit to Supine Supine to Sit: 6: Modified independent (Device/Increase time);HOB flat;With rails Sitting - Scoot to Edge of Bed: 6: Modified independent (Device/Increase time) Sit to Supine: 6: Modified independent (Device/Increase time) Details for Bed Mobility Assistance: incr time Transfers Transfers: Sit to Stand;Stand to Sit Sit to Stand: 6: Modified independent  (Device/Increase time);With upper extremity assist;From bed Stand to Sit: 6: Modified independent (Device/Increase time);With upper extremity assist;To bed Ambulation/Gait Ambulation/Gait Assistance: 5: Supervision Ambulation Distance (Feet): 200 Feet Assistive device: None Ambulation/Gait Assistance Details: Pt reports she feels "wobbly" but no LOB noted.  Ambulates with slow cautious gait.  Requires cues for pursed lip breathing.  Sp02 89-94% RA while amb Gait Pattern: Step-through pattern;Decreased stride length Stairs: No Wheelchair Mobility Wheelchair Mobility: No      PT Goals (current goals can now be found in the care plan section) Acute Rehab PT Goals Patient Stated Goal: return home PT Goal Formulation: With patient Time For Goal Achievement: 08/28/13 Potential to Achieve Goals: Good  Visit Information  Last PT Received On: 08/24/13 Assistance Needed: +1 History of Present Illness: 62 yo female with hx CHF presented [facility] ED 8/20 with SOB and AMS requiring (nasal) intubation by EMS.  In ER pt hypoxic, CXR c/w CHF    Subjective Data  Patient Stated Goal: return home   Cognition  Cognition Arousal/Alertness: Awake/alert Behavior During Therapy: WFL for tasks assessed/performed Overall Cognitive Status: Within Functional Limits for tasks assessed    Balance     End of Session PT - End of Session Equipment Utilized During Treatment: Gait belt Activity Tolerance: Patient tolerated treatment well Patient left: in bed;with call bell/phone within reach Nurse Communication: Mobility status   GP     Lara Mulch 08/24/2013, 10:35 AM   Verdell Face, PTA [phone] 08/24/2013  Agree with above assessment.  Lewis Shock, PT, DPT Pager #:  [phone] Office #: [phone]

## 2013-08-24 NOTE — Progress Notes (Signed)
No c/o pain this shift.  Ambulated in hallway with PT and tolerated well.  Received 40 mg Lasix IV as 1 time dose and is diuresing well.  Refer to I & O for documentation.  Using flutter valve prn @ bedside.  Had bedside CXR earlier today

## 2013-08-24 NOTE — Plan of Care (Signed)
Problem: Phase II Progression Outcomes Goal: Dyspnea controlled w/progressive activity Outcome: Completed/Met Date Met:  08/24/13 Ambulated in hall and tolerated well

## 2013-08-24 NOTE — Progress Notes (Signed)
Patient Name: Andrea [name]      SUBJECTIVE: admitted sob felt 2/2  CAP  Also with a/c systolic CHF with CAD and prior ICD-MDT  She was intubated and now extubated and improving  Her ICD has not been checked in a while  Past Medical History  Diagnosis Date  . CHF (congestive heart failure)   . CKD (chronic kidney disease) stage 3, GFR 30-59 ml/min   . DM (diabetes mellitus)   . CVA (cerebral infarction)   . HTN (hypertension)   . Primary pulmonary HTN     Scheduled Meds:  Scheduled Meds: . albuterol  2.5 mg Nebulization Q6H   And  . ipratropium  0.5 mg Nebulization Q6H  . antiseptic oral rinse  15 mL Mouth Rinse QID  . atorvastatin  40 mg Oral q1800  . bisoprolol  2.5 mg Oral BID  . clopidogrel  75 mg Oral Q breakfast  . DULoxetine  60 mg Oral Daily  . enoxaparin (LOVENOX) injection  40 mg Subcutaneous Q24H  . famotidine  20 mg Oral QHS  . feeding supplement  1 Container Oral BID WC  . furosemide  40 mg Oral Daily  . insulin aspart  0-5 Units Subcutaneous QHS  . insulin aspart  0-9 Units Subcutaneous TID WC  . insulin aspart  3 Units Subcutaneous TID WC  . levofloxacin  500 mg Oral Daily  . linagliptin  5 mg Oral Daily  . pantoprazole  40 mg Oral BID AC  . potassium chloride  20 mEq Oral BID   Continuous Infusions:   PHYSICAL EXAM Filed Vitals:   08/23/13 2030 08/24/13 0517 08/24/13 1056 08/24/13 1405  BP: 136/74 142/70 135/85 138/75  Pulse: 102 97 102 103  Temp: 97.8 F (36.6 C) 98 F (36.7 C) 97.4 F (36.3 C) 97.7 F (36.5 C)  TempSrc: Oral Oral Oral Oral  Resp: 20 20 20 20   Height:      Weight:  193 lb 5.5 oz (87.7 kg)    SpO2: 100% 99% 95% 96%  Well developed and nourished in no acute distress HENT normal Neck supple   Decrease BS Right lung Regular rate and rhythm, no murmurs or gallops Abd-soft with active BS No Clubbing cyanosis edema Skin-warm and dry A & Oriented  Grossly normal sensory and motor function   TELEMETRY: Reviewed  telemetry pt in *sinus tach    Intake/Output Summary (Last 24 hours) at 08/24/13 1451 Last data filed at 08/24/13 1403  Gross per 24 hour  Intake   1210 ml  Output   1200 ml  Net     10 ml    LABS: Basic Metabolic Panel:  Recent Labs Lab 08/19/13 1014 08/19/13 1043 08/19/13 1630 08/19/13 2330 08/20/13 0500 08/21/13 0500 08/23/13 0440  NA 139 142 141 141 141 135 141  K 4.8 4.4 3.6 3.4* 3.5 3.7 3.8  CL 100 105 102 99 100 97 103  CO2 20  --  26 27 27 30 27   GLUCOSE 369* 368* 212* 158* 151* 159* 102*  BUN 15 18 15 16 17  25* 20  CREATININE 1.03 1.20* 0.89 0.87 0.94 0.98 0.85  CALCIUM 9.1  --  9.2 9.2 9.5 9.0 9.7   Cardiac Enzymes: No results found for this basename: CKTOTAL, CKMB, CKMBINDEX, TROPONINI,  in the last 72 hours CBC:  Recent Labs Lab 08/19/13 1014 08/19/13 1043 08/20/13 0500 08/21/13 0500 08/22/13 0435 08/23/13 0440  WBC 11.6*  --  17.3* 11.1* 7.0 6.8  NEUTROABS  6.5  --   --   --   --   --   HGB 13.9 15.6* 13.8 12.5 10.3* 11.1*  HCT 41.4 46.0 40.0 37.1 31.0* 32.7*  MCV 70.2*  --  67.9* 69.0* 69.2* 68.8*  PLT 183  --  168 129* 116* 137*   PROTIME: No results found for this basename: LABPROT, INR,  in the last 72 hours Liver Function Tests: No results found for this basename: AST, ALT, ALKPHOS, BILITOT, PROT, ALBUMIN,  in the last 72 hours No results found for this basename: LIPASE, AMYLASE,  in the last 72 hours BNP: BNP (last 3 results)  Recent Labs  08/19/13 1024  PROBNP 1769.0*      Device Interrogation: Maximo normal function and without episodes   ASSESSMENT AND PLAN:  Active Problems:   Acute respiratory failure with hypoxia   Acute systolic CHF (congestive heart failure)   Encephalopathy acute   CAP (community acquired pneumonia)   Positive blood culture   Cardiomyopathy   DM2 (diabetes mellitus, type 2) sinus tachycardia  iwll increase bisoprolol 2.5>>5    willl defer advancing meds to Dr Garth Bigness   Signed, Sherryl Manges  MD  08/24/2013

## 2013-08-24 NOTE — Progress Notes (Signed)
PULMONARY  / CRITICAL CARE MEDICINE  Name: Arlesia Kiel MRN: [id number] DOB: January 14, 1951    ADMISSION DATE:  08/19/2013  REFERRING MD :  EDP PRIMARY SERVICE: PCCM   CHIEF COMPLAINT:  SOB  BRIEF PATIENT DESCRIPTION: 34 yobf  with hx CHF presented to [facility] ED 8/20 with SOB and AMS requiring (nasal) intubation by EMS.  In ER pt hypoxic, CXR c/w CHF.  PCCM called to admit.   SIGNIFICANT EVENTS / STUDIES:  8/20 Echo: The cavity size was normal. Wall thickness was increased in a pattern of mild LVH. The estimated ejection fraction was 20%. Diffuse hypokinesis  8/21 elevated PCT. Development of LLL consolidation. Positive BC. Working dx: CAP 8/21: Passed SBT. Cognition fully intact. Extubated   LINES / TUBES: Nasal ETT 8/20 >> 8/21 R Prichard CVL 8/20 >>    CULTURES: PCT 8/20-8/22 >> 4.49, 10.62,    Urine 8/20 >> Neg Resp 8/20 >> neg BCx2 8/20 >> 1/2 coag neg staph (contaminant)   ANTIBIOTICS: Cefepime 8/20 >> 8/22 Vancomycin 8/20 >> 8/23 Levofloxacin 8/22 >>   VITAL SIGNS: Temp:  [97.4 F (36.3 C)-98 F (36.7 C)] 98 F (36.7 C) (08/25 0517) Pulse Rate:  [97-102] 97 (08/25 0517) Resp:  [20] 20 (08/25 0517) BP: (135-142)/(70-89) 142/70 mmHg (08/25 0517) SpO2:  [97 %-100 %] 99 % (08/25 0517) Weight:  [87.7 kg (193 lb 5.5 oz)] 87.7 kg (193 lb 5.5 oz) (08/25 0517) 02 rx:   RA      INTAKE / OUTPUT: Intake/Output     08/24 0701 - 08/25 0700 08/25 0701 - 08/26 0700   P.O. 580    I.V. (mL/kg) 490 (5.6)    Total Intake(mL/kg) 1070 (12.2)    Urine (mL/kg/hr) 600 (0.3)    Total Output 600     Net +470            PHYSICAL EXAMINATION: General:  Dyspnea with mild exertion, NAD @ rest  Neuro:  MAEs, no focal deficits HEENT:  WNL Cardiovascular:  Tachy, reg, no M  Lungs:  Clear anteriorly without wheezes, crackles in bases.  Abdomen:  Obese, soft, +bs Musculoskeletal:  Warm and dry, no edema   LABS:  CBC Recent Labs     08/22/13  0435  08/23/13  0440  WBC  7.0  6.8   HGB  10.3*  11.1*  HCT  31.0*  32.7*  PLT  116*  137*   Coag's No results found for this basename: APTT, INR,  in the last 72 hours BMET Recent Labs     08/23/13  0440  NA  141  K  3.8  CL  103  CO2  27  BUN  20  CREATININE  0.85  GLUCOSE  102*   Electrolytes Recent Labs     08/23/13  0440  CALCIUM  9.7   Glucose Recent Labs     08/22/13  1633  08/22/13  2137  08/23/13  0557  08/23/13  1101  08/23/13  1552  08/23/13  2100  GLUCAP  99  105*  96  91  183*  226*    CXR:  Increase in bilateral effusions   ASSESSMENT / PLAN: CAP (NOS) and decompensated systolic HF w/ pulmonary edema (improved).  Bilateral pleural effusions.  Pseudowheeze present 8/24, she actively reports GERD. This may be a mix of reflux and post-intubation trauma.  P:   Extra lasix ordered F/u am cxr Cont nebulized BDs Nasal saline   Severe nonischemic CM CAD  P:  Extra lasix  cont bisoprolol low doses ACEi very risky given tenuous upper airway status Cont plavix  Would she benefit by getting set up with CHF program? Have spoken w/ cards in re: to this and for formal d/c recs  Mild thrombocytopenia, trending up P:  Monitor CBC intermittently  1/2 positive BC, staph epi - contaminant P:   rx per dashboard  DM II, controlled P:   Cont SSI, will go home on usual regimen   Post extubation weakness/ deconditioning  P:   PT consult 8/22, decide on [facility] PT   Resolved issues.  Acute respiratory failure, in setting of CAP (NOS) and decompensated systolic HF w/ pulmonary edema (resolved).  Former smoker - no known hx COPD Acute encephalopathy Reactive tachycardia Acute renal insufficiency  AG metab acidosis/ Lactic acidosis  Dispo: Home in am. Would like to see CXR improve. Have asked cards to eval for CHF program    Billy Fischer, MD ; [facility] [phone].  After 5:30 PM or weekends, call [phone]

## 2013-08-24 NOTE — Progress Notes (Signed)
Physical Therapy Treatment Patient Details Name: Andrea [name] MRN: [id number] DOB: 07-30-1951 Today's Date: 08/24/2013 Time: 4696-2952 PT Time Calculation (min): 24 min  PT Assessment / Plan / Recommendation  History of Present Illness 62 yo female with hx CHF presented to [facility] ED 8/20 with SOB and AMS requiring (nasal) intubation by EMS.  In ER pt hypoxic, CXR c/w CHF   PT Comments   Pt moves well but she states she does not feel completely back at baseline with strength & balance.  When asked if she would like HHPT she stated she would, therefore d/c plans updated.  Pt also becomes SOB with activity with 02 sats dropping to 89% RA only once but quickly returning back to >90% & remained WNL remainder of distance.      Follow Up Recommendations  Home health PT;  (Would benefit from [facility])     Does the patient have the potential to tolerate intense rehabilitation     Barriers to Discharge        Equipment Recommendations  None recommended by PT    Recommendations for Other Services    Frequency Min 3X/week   Progress towards PT Goals Progress towards PT goals: Progressing toward goals  Plan Discharge plan needs to be updated    Precautions / Restrictions Precautions Precautions: Fall Restrictions Weight Bearing Restrictions: No   Pertinent Vitals/Pain 02 sats:  Dropped to 89% RA briefly but quickly recovered with standing rest break & pursed lip breathing then 02 sats remaining >90% remainder of ambulation.     Mobility  Bed Mobility Bed Mobility: Supine to Sit;Sitting - Scoot to Edge of Bed;Sit to Supine Supine to Sit: 6: Modified independent (Device/Increase time);HOB flat;With rails Sitting - Scoot to Edge of Bed: 6: Modified independent (Device/Increase time) Sit to Supine: 6: Modified independent (Device/Increase time) Details for Bed Mobility Assistance: incr time Transfers Transfers: Sit to Stand;Stand to Sit Sit to Stand: 6: Modified independent  (Device/Increase time);With upper extremity assist;From bed Stand to Sit: 6: Modified independent (Device/Increase time);With upper extremity assist;To bed Ambulation/Gait Ambulation/Gait Assistance: 5: Supervision Ambulation Distance (Feet): 200 Feet Assistive device: None Ambulation/Gait Assistance Details: Pt reports she feels "wobbly" but no LOB noted.  Ambulates with slow cautious gait.  Requires cues for pursed lip breathing.  Sp02 89-94% RA while amb Gait Pattern: Step-through pattern;Decreased stride length Stairs: No Wheelchair Mobility Wheelchair Mobility: No      PT Goals (current goals can now be found in the care plan section) Acute Rehab PT Goals Patient Stated Goal: return home PT Goal Formulation: With patient Time For Goal Achievement: 08/28/13 Potential to Achieve Goals: Good  Visit Information  Last PT Received On: 08/24/13 Assistance Needed: +1 History of Present Illness: 62 yo female with hx CHF presented to [facility] ED 8/20 with SOB and AMS requiring (nasal) intubation by EMS.  In ER pt hypoxic, CXR c/w CHF    Subjective Data  Patient Stated Goal: return home   Cognition  Cognition Arousal/Alertness: Awake/alert Behavior During Therapy: WFL for tasks assessed/performed Overall Cognitive Status: Within Functional Limits for tasks assessed    Balance     End of Session PT - End of Session Equipment Utilized During Treatment: Gait belt Activity Tolerance: Patient tolerated treatment well Patient left: in bed;with call bell/phone within reach Nurse Communication: Mobility status   GP     Lara Mulch 08/24/2013, 10:35 AM   Verdell Face, PTA [phone] 08/24/2013

## 2013-08-25 ENCOUNTER — Telehealth: Payer: Self-pay | Admitting: Cardiology

## 2013-08-25 ENCOUNTER — Inpatient Hospital Stay (HOSPITAL_COMMUNITY): Payer: BC Managed Care – PPO

## 2013-08-25 LAB — CULTURE, BLOOD (ROUTINE X 2): Culture: NO GROWTH

## 2013-08-25 LAB — GLUCOSE, CAPILLARY
Glucose-Capillary: 87 mg/dL (ref 70–99)
Glucose-Capillary: 117 mg/dL — ABNORMAL HIGH (ref 70–99)
Glucose-Capillary: 113 mg/dL — ABNORMAL HIGH (ref 70–99)
Glucose-Capillary: 122 mg/dL — ABNORMAL HIGH (ref 70–99)

## 2013-08-25 LAB — BASIC METABOLIC PANEL
BUN: 34 mg/dL — ABNORMAL HIGH (ref 6–23)
CO2: 28 mEq/L (ref 19–32)
Glucose, Bld: 136 mg/dL — ABNORMAL HIGH (ref 70–99)
Potassium: 4.4 mEq/L (ref 3.5–5.1)
Sodium: 142 mEq/L (ref 135–145)

## 2013-08-25 MED ORDER — POTASSIUM CHLORIDE CRYS ER 20 MEQ PO TBCR: 20.0000 meq | EXTENDED_RELEASE_TABLET | Freq: Every day | ORAL | Status: DC

## 2013-08-25 MED ORDER — HYDROMORPHONE HCL PF 1 MG/ML IJ SOLN
INTRAMUSCULAR | Status: AC
  Filled 2013-08-25: qty 1

## 2013-08-25 MED ORDER — FUROSEMIDE 40 MG PO TABS: 40.0000 mg | ORAL_TABLET | Freq: Every day | ORAL | Status: DC

## 2013-08-25 MED ORDER — BISOPROLOL FUMARATE 5 MG PO TABS: 5.0000 mg | ORAL_TABLET | Freq: Two times a day (BID) | ORAL | Status: DC

## 2013-08-25 MED ORDER — MORPHINE SULFATE 4 MG/ML IJ SOLN
INTRAMUSCULAR | Status: AC
  Filled 2013-08-25: qty 1

## 2013-08-25 MED ORDER — ALBUTEROL SULFATE (5 MG/ML) 0.5% IN NEBU
INHALATION_SOLUTION | RESPIRATORY_TRACT | Status: AC
  Filled 2013-08-25: qty 0.5

## 2013-08-25 NOTE — Telephone Encounter (Signed)
Per rhonda pa pt needs toc appt within 7 days, [place], pa's and np's , pls advise

## 2013-08-25 NOTE — Progress Notes (Signed)
Utilization Review Completed.   Aram Domzalski, RN, BSN Nurse Case Manager  [phone]  

## 2013-08-25 NOTE — Progress Notes (Signed)
Physical Therapy Treatment Patient Details Name: Andrea [name] MRN: [id number] DOB: January 15, 1951 Today's Date: 08/25/2013 Time: 4098-1191 PT Time Calculation (min): 20 min  PT Assessment / Plan / Recommendation  History of Present Illness 62 yo female with hx CHF presented to [facility] ED 8/20 with SOB and AMS requiring (nasal) intubation by EMS.  In ER pt hypoxic, CXR c/w CHF      Follow Up Recommendations  Home health PT     Does the patient have the potential to tolerate intense rehabilitation     Barriers to Discharge        Equipment Recommendations  None recommended by PT    Recommendations for Other Services    Frequency Min 3X/week   Progress towards PT Goals Progress towards PT goals: Progressing toward goals  Plan Current plan remains appropriate    Precautions / Restrictions Precautions Precautions: Fall Restrictions Weight Bearing Restrictions: No   Pertinent Vitals/Pain Sp02 >92% RA entire session     Mobility  Bed Mobility Bed Mobility: Supine to Sit;Sitting - Scoot to Edge of Bed;Sit to Supine Supine to Sit: 6: Modified independent (Device/Increase time) Sitting - Scoot to Edge of Bed: 6: Modified independent (Device/Increase time) Sit to Supine: 6: Modified independent (Device/Increase time) Transfers Transfers: Sit to Stand;Stand to Sit Sit to Stand: 6: Modified independent (Device/Increase time);From bed Stand to Sit: 6: Modified independent (Device/Increase time);To bed Ambulation/Gait Ambulation/Gait Assistance: 5: Supervision Ambulation Distance (Feet): 300 Feet Assistive device: None Ambulation/Gait Assistance Details: Pt performed various gait challenges such as head turns in all directions, directional changes, & sudden stop/go on command.  Pt with decreased pace but she states this is her normal pace.  Pt cont's to report LE's feeling weak & fatigue with activity.  Sp02 remained >92% RA.   Gait Pattern: Step-through pattern;Decreased stride  length Gait velocity: decreased Stairs: No Wheelchair Mobility Wheelchair Mobility: No      PT Goals (current goals can now be found in the care plan section) Acute Rehab PT Goals Patient Stated Goal: return home PT Goal Formulation: With patient Time For Goal Achievement: 08/28/13 Potential to Achieve Goals: Good  Visit Information  Last PT Received On: 08/25/13 Assistance Needed: +1 History of Present Illness: 62 yo female with hx CHF presented to [facility] ED 8/20 with SOB and AMS requiring (nasal) intubation by EMS.  In ER pt hypoxic, CXR c/w CHF    Subjective Data  Patient Stated Goal: return home   Cognition  Cognition Arousal/Alertness: Awake/alert Behavior During Therapy: WFL for tasks assessed/performed Overall Cognitive Status: Within Functional Limits for tasks assessed    Balance     End of Session PT - End of Session Equipment Utilized During Treatment: Gait belt Activity Tolerance: Patient tolerated treatment well Patient left: in bed;with call bell/phone within reach Nurse Communication: Mobility status   GP     Lara Mulch 08/25/2013, 9:19 AM  Verdell Face, PTA [phone] 08/25/2013

## 2013-08-25 NOTE — Telephone Encounter (Signed)
Returned call to patient no answer.LMTC. 

## 2013-08-25 NOTE — Progress Notes (Signed)
1330 iv team staff removed CVP line R s/c w/o incident . Stayed in bed x 30 mins . Remained dressing dry and intact . Denied pain /discomfort

## 2013-08-25 NOTE — Care Management Note (Signed)
    Page 1 of 1   08/25/2013     12:30:14 PM   CARE MANAGEMENT NOTE 08/25/2013  Patient:  KARLINA, SUARES   Account Number:  [id number]  Date Initiated:  08/19/2013  Documentation initiated by:  Junius Creamer  Subjective/Objective Assessment:   adm w resp distress, vent     Action/Plan:   lives w sign other   Anticipated DC Date:     Anticipated DC Plan:        DC Planning Services  CM consult      Choice offered to / List presented to:  C-1 Patient        HH arranged  HH-1 RN  HH-2 PT      HH agency  [facility]   Status of service:  Completed, signed off Medicare Important Message given?   (If response is "NO", the following Medicare IM given date fields will be blank) Date Medicare IM given:   Date Additional Medicare IM given:    Discharge Disposition:  HOME W HOME HEALTH SERVICES  Per UR Regulation:  Reviewed for med. necessity/level of care/duration of stay  If discussed at Long Length of Stay Meetings, dates discussed:   08/25/2013    Comments:  08/25/13 1100 In to speak with pt. about home health services.  PT rec HH PT.  Pt. is interested in having home health, and chose [facility].  TC to [place], with [name], to give referral for [facility] PT/RN.  Pt. to dc home today. Tera Mater, RN, BSN NCM [phone]

## 2013-08-25 NOTE — Progress Notes (Signed)
Per MD order, central line removed. IV cathter intact. Vaseline pressure gauze to site, pressure held x 5 min, no bleeding to site. Pt instructed not to get out of bed for 30 min after the removal of the central line. Instucted to keep dressing CDI x 24hours, if bleeding occurs hold pressure, if bleeding does not stop contact MD or go to the ED. Pt verbalized understanding and did not have any questions. Andrea [name] M  

## 2013-08-25 NOTE — Progress Notes (Signed)
Patient Name: Andrea [name] Date of Encounter: 08/25/2013  Active Problems:   Acute respiratory failure with hypoxia   Acute systolic CHF (congestive heart failure)   Encephalopathy acute   CAP (community acquired pneumonia)   Positive blood culture   Cardiomyopathy   DM2 (diabetes mellitus, type 2)   Automatic implantable cardiac defibrillator - Medtronic    SUBJECTIVE: Breathing better, still with congestion and cough. + DOE  OBJECTIVE Filed Vitals:   08/24/13 1939 08/24/13 2108 08/25/13 0100 08/25/13 0515  BP:  126/66  114/68  Pulse:  105  99  Temp:  97.7 F (36.5 C)  97.5 F (36.4 C)  TempSrc:  Oral  Oral  Resp:  20  19  Height:      Weight:    188 lb 6.4 oz (85.458 kg)  SpO2: 100% 95% 97% 95%    Intake/Output Summary (Last 24 hours) at 08/25/13 0817 Last data filed at 08/25/13 0515  Gross per 24 hour  Intake    780 ml  Output   3700 ml  Net  -2920 ml   Filed Weights   08/23/13 0613 08/24/13 0517 08/25/13 0515  Weight: 192 lb 10.9 oz (87.4 kg) 193 lb 5.5 oz (87.7 kg) 188 lb 6.4 oz (85.458 kg)    PHYSICAL EXAM General: Well developed, well nourished, female in no acute distress. Head: Normocephalic, atraumatic.  Neck: Supple without bruits, JVD about 10 cm. Lungs:  Resp regular and unlabored, rales bases, wheezy cough, rhonchi decrease with cough. Heart: RRR, S1, S2, no S3, S4, or murmur; no rub. Abdomen: Soft, non-tender, non-distended, BS + x 4.  Extremities: No clubbing, cyanosis, no edema.  Neuro: Alert and oriented X 3. Moves all extremities spontaneously. Psych: Normal affect.  LABS: CBC: Recent Labs  08/23/13 0440  WBC 6.8  HGB 11.1*  HCT 32.7*  MCV 68.8*  PLT 137*   Basic Metabolic Panel: Recent Labs  08/23/13 0440 08/25/13 0500  NA 141 142  K 3.8 4.4  CL 103 103  CO2 27 28  GLUCOSE 102* 136*  BUN 20 34*  CREATININE 0.85 1.18*  CALCIUM 9.7 10.1   BNP: Pro B Natriuretic peptide (BNP)  Date/Time Value Range Status    08/19/2013 10:24 AM 1769.0* 0 - 125 pg/mL Final    TELE:  SR    Radiology/Studies: Dg Chest Port 1 View 08/24/2013   *RADIOLOGY REPORT*  Clinical Data: Shortness of breath and cough  PORTABLE CHEST - 1 VIEW  Comparison: 08/22/2013  Findings: The defibrillator is again seen and stable.  Stable cardiomegaly is noted.  A right-sided central venous line is again seen and unchanged.  Bilateral pleural effusions are seen which appear new from prior exam.  There is likely underlying atelectasis as well.  IMPRESSION: New bilateral pleural effusions.   Original Report Authenticated By: Alcide Clever, M.D.     Current Medications:  . albuterol  2.5 mg Nebulization Q6H   And  . ipratropium  0.5 mg Nebulization Q6H  . antiseptic oral rinse  15 mL Mouth Rinse QID  . atorvastatin  40 mg Oral q1800  . bisoprolol  5 mg Oral BID  . clopidogrel  75 mg Oral Q breakfast  . DULoxetine  60 mg Oral Daily  . enoxaparin (LOVENOX) injection  40 mg Subcutaneous Q24H  . feeding supplement  1 Container Oral BID WC  . furosemide  40 mg Oral Daily  . insulin aspart  0-5 Units Subcutaneous QHS  . insulin aspart  0-9  Units Subcutaneous TID WC  . insulin aspart  3 Units Subcutaneous TID WC  . levofloxacin  500 mg Oral Daily  . linagliptin  5 mg Oral Daily  . pantoprazole  40 mg Oral Daily  . potassium chloride  20 mEq Oral BID      ASSESSMENT AND PLAN:  62 yo female with hx ICM admitted 8/20 with resp failure, ETT 8/20-21, due to CAP, CHF, EF 20%.She is followed closely by Dr. [place],  under MRN: [id number]. Cardiology asked to address CHF issues, consider referral to CHF clinic.    Acute systolic CHF (congestive heart failure) - Had IV Lasix 40 mg yest and wt is down 5 lbs. Still with some congestion, but weight is within 2 lbs of d/c weight in May 2014 (see other MRN). Continue current dose oral Lasix at d/c. Will set up ofc f/u TOC - left msg with office. Dr. [place] to review and advise on CHF referral. Have sent  him staff message. No ACE/ARB secondary to history of intolerance including hypotension and ARF in 2013. On BB, Coreg changed to bisoprolol by CCM.    CAD - no ischemic symptoms. Was placed on Plavix 03/2013 due to [facility] She has a history of being a non-responder to Plavix. However, has not tolerated Brilinta in the past and cannot be on Effient due to CVA history. MD advise on re-checking P2Y12.  Otherwise, per CCM.  Active Problems:   Acute respiratory failure with hypoxia   Encephalopathy acute   CAP (community acquired pneumonia)   Positive blood culture   Cardiomyopathy   DM2 (diabetes mellitus, type 2)   Automatic implantable cardiac defibrillator - [employer]tr[employer]  Signed, Theodore Demark , PA-C 8:17 AM 08/25/2013 Patient examined and agree except changes made.  Valera Castle, MD 08/25/2013 4:47 PM

## 2013-09-02 ENCOUNTER — Telehealth: Payer: Self-pay | Admitting: Pulmonary Disease

## 2013-09-02 NOTE — Telephone Encounter (Signed)
lmomtcb x1 for Andrea [name] Pt has not been seen in office and no pending appt. This needs to go through PCP.

## 2013-09-03 NOTE — Telephone Encounter (Signed)
I spoke with Andrea [name]. I made him aware of the below. Nothing further needed

## 2013-09-04 NOTE — Telephone Encounter (Signed)
Returned call to patient [facility] appointment scheduled 09/07/13 at 9:00 am with [name] NP.

## 2013-09-07 ENCOUNTER — Encounter: Payer: Self-pay | Admitting: Nurse Practitioner

## 2013-09-07 ENCOUNTER — Other Ambulatory Visit: Payer: Self-pay | Admitting: *Deleted

## 2013-09-07 ENCOUNTER — Ambulatory Visit (INDEPENDENT_AMBULATORY_CARE_PROVIDER_SITE_OTHER): Payer: BC Managed Care – PPO | Admitting: Nurse Practitioner

## 2013-09-07 VITALS — BP 120/70 | HR 92 | Ht 59.0 in | Wt 191.8 lb

## 2013-09-07 DIAGNOSIS — R0609 Other forms of dyspnea: Secondary | ICD-10-CM

## 2013-09-07 DIAGNOSIS — R0600 Dyspnea, unspecified: Secondary | ICD-10-CM

## 2013-09-07 DIAGNOSIS — I255 Ischemic cardiomyopathy: Secondary | ICD-10-CM

## 2013-09-07 DIAGNOSIS — I2589 Other forms of chronic ischemic heart disease: Secondary | ICD-10-CM

## 2013-09-07 DIAGNOSIS — R0989 Other specified symptoms and signs involving the circulatory and respiratory systems: Secondary | ICD-10-CM

## 2013-09-07 LAB — BASIC METABOLIC PANEL
BUN: 19 mg/dL (ref 6–23)
CO2: 29 mEq/L (ref 19–32)
Calcium: 8.9 mg/dL (ref 8.4–10.5)
Chloride: 106 mEq/L (ref 96–112)
Creatinine, Ser: 1 mg/dL (ref 0.4–1.2)
GFR: 71.39 mL/min (ref >60.00)
Glucose, Bld: 186 mg/dL — ABNORMAL HIGH (ref 70–99)
Potassium: 4.1 mEq/L (ref 3.5–5.1)
Sodium: 141 mEq/L (ref 135–145)

## 2013-09-07 LAB — BRAIN NATRIURETIC PEPTIDE: Pro B Natriuretic peptide (BNP): 40 pg/mL (ref 0.0–100.0)

## 2013-09-07 MED ORDER — BUPROPION HCL ER (XL) 150 MG PO TB24: 300.0000 mg | ORAL_TABLET | Freq: Every day | ORAL | Status: DC

## 2013-09-07 MED ORDER — FUROSEMIDE 20 MG PO TABS: 40.0000 mg | ORAL_TABLET | Freq: Every day | ORAL | Status: DC

## 2013-09-07 MED ORDER — BISOPROLOL FUMARATE 5 MG PO TABS: 5.0000 mg | ORAL_TABLET | Freq: Every day | ORAL | Status: DC

## 2013-09-07 NOTE — Progress Notes (Addendum)
Andrea [name] Date of Birth: 05/23/1951 Medical Record #[id number]  History of Present Illness: Andrea [name] is seen back today for a TOC visit. Seen for Andrea [name]. Has an ischemic CM with an EF of 25% - has ICD in place, CAD with stenting of the distal RCA in December of 2011. In April of 2012, she had subtotal occlusion of the proximal RCA and this was stented with a DES stent. Cath in June of 2012 showed nonobstructive disease. Was placed on Plavix 03/2013 due to the DES-RCA and has a history of being a non-responder to Plavix. However, has not tolerated Brilinta in the past due to wheezing and cannot be on Effient due to CVA history.  Her other issues include mild thrombocytopenia, type 2 DM, former smoker, HLD, anxiety and depression.   Most recently admitted with dyspnea - had called 911 from home - had sats down in the 70's. Deteriorated very quickly - and was intubated. Was treated for possible CAP but no organism identified and this was felt to possibly have triggered her exacerbation. Echo showed EF still at 20%. Coreg changed to Zebeta and stopped ACE given concern for her upper airway - apparently had significant upper airway irritation and cough. Chart notes no prior ACE/ARB secondary to history of intolerance including hypotension and ARF in 2013.   Comes in today. Here with her partner. Has been out of the hospital since August 26th. Says she is doing ok. No real problems. Has been a little dizzy at times and wobbly. No chest pain. Cough has resolved. She notes that her Lisinopril was a new medicine for her apparently. No swelling. No PND/orthopnea. Weight is up and down - not taking any extra lasix. She denies an allergy to Diovan (says it was Darvon). She is NOT on Zebeta and is on full dose Coreg. Overall she thinks she is doing ok. No shocks. Says she has taken her medicines today.   CAD - no ischemic symptoms.    Current Outpatient Prescriptions  Medication Sig Dispense  Refill  . Acetaminophen (TYLENOL ARTHRITIS PAIN PO) Take 2 tablets by mouth as needed (pain).      Marland Kitchen atorvastatin (LIPITOR) 40 MG tablet Take 40 mg by mouth daily.      Marland Kitchen buPROPion (WELLBUTRIN XL) 150 MG 24 hr tablet Take 150 mg by mouth daily.       . carvedilol (COREG) 25 MG tablet Take 1 tablet (25 mg total) by mouth 2 (two) times daily with a meal.  60 tablet  3  . clopidogrel (PLAVIX) 75 MG tablet Take 1 tablet (75 mg total) by mouth daily with breakfast.  30 tablet  11  . DULoxetine (CYMBALTA) 60 MG capsule Take 60 mg by mouth daily.        . furosemide (LASIX) 20 MG tablet Take 1 tablet (20 mg total) by mouth daily.  30 tablet  11  . JANUVIA 100 MG tablet Take 100 mg by mouth Daily.       Marland Kitchen loratadine (CLARITIN) 10 MG tablet Take 10 mg by mouth as needed for allergies.      . nitroGLYCERIN (NITROSTAT) 0.4 MG SL tablet Place 0.4 mg under the tongue every 5 (five) minutes as needed for chest pain.      . pantoprazole (PROTONIX) 40 MG tablet Take 40 mg by mouth daily.      . potassium chloride SA (K-DUR,KLOR-CON) 20 MEQ tablet Take 1 tablet (20 mEq total) by mouth daily.  30  tablet  6   No current facility-administered medications for this visit.    Allergies  Allergen Reactions  . Asa [Aspirin] Shortness Of Breath  . Aspirin Shortness Of Breath and Other (See Comments)    Asthma symptoms  . Brilinta [Ticagrelor] Shortness Of Breath and Other (See Comments)    Gout   . Percocet [Oxycodone-Acetaminophen] Nausea And Vomiting  . Darvon Nausea And Vomiting  . Diovan [Valsartan] Other (See Comments)    unknown  . Imitrex [Sumatriptan Base] Other (See Comments)    Unknown reaction  . Nsaids Nausea And Vomiting  . Nsaids Other (See Comments)    GI upset     Past Medical History  Diagnosis Date  . Chronic systolic heart failure   . Hypertension   . Stroke 02/2010    left brain CVA? recurrent TIA's treated with TPA  . Dyslipidemia   . Premature ventricular contractions (PVCs)  (VPCs)   . GERD (gastroesophageal reflux disease)   . Coronary artery disease     a. DES-dRCA 11/2010. b. DES-pRCA 04/2011 (in an area free of disease on prior cath). c. s/p DES-mRCA 03/2013 for NSTEMI.  . Ischemic cardiomyopathy     a. Hx of medtronic ICD. b. EF 15-20% by cath 03/2013.  . Pulmonary hypertension   . Gout   . Obesity   . Thrombocytopenia   . Depression   . Hypotension     a. Admission 09/2012 for hypotn & ARF. b. Meds held 03/2013 due to hypotension.  . Acute renal insufficiency     a. 09/2012. b. Also noted post-cath 03/2013.  . NSTEMI (non-ST elevated myocardial infarction) 03/2013  . Anginal pain   . Asthma   . Pneumonia ~ 2001    "double" (05/07/2013)  . Shortness of breath     "can happen at anytime" (05/07/2013)  . Type II diabetes mellitus   . Microcytic anemia     a. Noted 03/2013 on labs, iron studies WNL.  Marland Kitchen Migraine headache     "no pain; aura/visual problems only; at least 2-3X/month" (05/07/2013)  . Headaches, cluster     "monthly" (05/07/2013)  . Arthritis     "knees" (05/07/2013)  . DJD (degenerative joint disease)   . Chronic lower back pain   . ICD (implantable cardiac defibrillator) in place   . Anxiety   . CHF (congestive heart failure)   . CKD (chronic kidney disease) stage 3, GFR 30-59 ml/min   . DM (diabetes mellitus)   . CVA (cerebral infarction)   . HTN (hypertension)   . Primary pulmonary HTN   . Automatic implantable cardiac defibrillator - Medtronic 08/24/2013    Past Surgical History  Procedure Laterality Date  . Mass excision      left hip  . Cervical polypectomy  1970's  . US echocardiography  2011  . Tonsillectomy  1960's?  . Right oophorectomy  1980's  . Dilation and curettage of uterus  1970's  . Ostectomy metatarsal Left 1993    "5th toe; from rickets" (05/07/2013)  . Coronary angioplasty with stent placement  2000's    "2 stents" (05/07/2013)  . Coronary angioplasty with stent placement  03/2013    PCI to RCA/notes 05/05/2013;  "makes total of 3" (05/07/2013)  . Implantable cardioverter defibrillator implant      History  Smoking status  . Former Smoker -- 0.05 packs/day for 5 years  . Types: Cigarettes  . Quit date: 04/23/1982  Smokeless tobacco  . Not on file  History  Alcohol Use No    Family History  Problem Relation Age of Onset  . Heart failure Mother   . Heart attack Father   . Heart disease Brother     Review of Systems: The review of systems is per the HPI.  All other systems were reviewed and are negative.  Physical Exam: BP 120/70  Pulse 92  Ht 4\' 11"  (1.499 m)  Wt 191 lb 12.8 oz (87 kg)  BMI 38.72 kg/m2 Patient is very pleasant and in no acute distress. She is obese. Skin is warm and dry. Color is normal.  HEENT is unremarkable. Normocephalic/atraumatic. PERRL. Sclera are nonicteric. Neck is supple. No masses. No JVD. Lungs are clear. Cardiac exam shows a regular rate and rhythm. Rate is 100 by me. Abdomen is soft. Extremities are without edema. Gait and ROM are intact. No gross neurologic deficits noted.  LABORATORY DATA:  Lab Results  Component Value Date   WBC 6.8 08/23/2013   HGB 11.1* 08/23/2013   HCT 32.7* 08/23/2013   PLT 137* 08/23/2013   GLUCOSE 136* 08/25/2013   CHOL 154 08/20/2013   TRIG 166* 08/20/2013   HDL 53 08/20/2013   LDLCALC 68 08/20/2013   ALT 18 08/19/2013   AST 39* 08/19/2013   NA 142 08/25/2013   K 4.4 08/25/2013   CL 103 08/25/2013   CREATININE 1.18* 08/25/2013   BUN 34* 08/25/2013   CO2 28 08/25/2013   TSH 0.870 04/26/2013   INR 1.07 08/19/2013   HGBA1C 6.4* 08/19/2013   Echo Study Conclusions from August 2014  - Left ventricle: The cavity size was normal. Wall thickness was increased in a pattern of mild LVH. The estimated ejection fraction was 20%. Diffuse hypokinesis. There is akinesis of the mid-distalanteroseptal myocardium. The study is not technically sufficient to allow evaluation of LV diastolic function. - Mitral valve: Mild regurgitation. -  Pulmonary arteries: PA peak pressure: 35mm Hg (S  Dg Chest 2 View  08/25/2013   IMPRESSION: No evidence of pulmonary edema.  Small right pleural effusion and minimal left pleural effusion.   Original Report Authenticated By: Sherian Rein, M.D.   CARDIAC CATH FINAL INTERPRETATION FROM June 2012:  1. Nonobstructive coronary artery disease. The stents in the right  coronary artery are widely patent.  2. Severe left ventricular dysfunction.  PLAN: I would recommend continued medical therapy. The patient has had  platelet inhibition of 0% on Plavix. She cannot take Effient due to a  prior stroke. We will place her on Brilinta. I would recommend in  future cardiac catheterizations that she could not be done through the  right radial approach given the difficulty of her procedure today.  ______________________________  Peter M. [name], M.D.   Assessment / Plan:  1. Cardiomyopathy - EF of 20% - recent CHF exacerbation - no longer on ACE. Discrepancy about her beta blocker - she feels pretty sure she is on Coreg - but HR is up today. She needs to verify her list given today with her actual bottles. May need to try ARB in the future. We will recheck labs today. She needs to use extra Lasix prn weight gain. Keep avoiding salt.   2. CAP - seems resolved. No more cough. No wheezing.   3. CAD - prior PCIs - no chest pain reported. MD advise on re-checking P2Y12. Seems like this has already been documented that it does not work for her. She is currently asymptomatic.   We will recheck her labs today.  I will see her in about 2 weeks on a day that Andrea [name] is here as well.   Patient is agreeable to this plan and will call if any problems develop in the interim.   Rosalio Macadamia, RN, ANP-C [facility] [address] [place], [place]  [address]   Addendum:  Patient called back this afternoon to review her medicines. She is NOT on the Coreg like she told me at  the OV. Lasix was 40 mg. Wellbutrin was 300mg . She does have the Zebeta but has not taken - she is to start this TODAY.

## 2013-09-07 NOTE — Patient Instructions (Addendum)
Continue with your current medicines. Make sure this list matches up with your actual pill bottles - let us know if it does not.   Weigh yourself each morning and record.  Take extra dose of diuretic for weight gain of 3 pounds in 24 hours.   Limit sodium intake. Goal is to have less than 2000 mg (2gm) of salt per day.  We are checking labs today  We need to see you back in 2 weeks.  Call the [facility] office at [phone] if you have any questions, problems or concerns.

## 2013-09-09 ENCOUNTER — Telehealth: Payer: Self-pay | Admitting: Cardiology

## 2013-09-09 NOTE — Telephone Encounter (Signed)
Spoke with patient who states since she started taking Zebeta 5 mg 2 days ago that she has shaking in her extremities and feels unsteady on her feet which occurs approximately 1/2 hour after she takes the medication. Patient reports taking Zebeta 5 mg BID.  I advised patient that orders state that she is supposed to be on medication once per day.  Patient states that she thought she was told twice daily.  I advised patient that since [name], NP, whom patient saw on 9/8 is in the office that I would review with her.   I advised patient that Lawson Fiscal prescribed Zebeta 5 mg QD and advised patient take the medication once daily and see if she can better tolerate it without side effects.  Patient states she only took one today and still had tremors.  I advised patient that if she has been taking 2 per day that today may not have been enough time to notice a change, to call us back in 5-7 days if she continues to experience tremors with medication.  Patient verbalized understanding and agreement with plan of care.

## 2013-09-09 NOTE — Telephone Encounter (Signed)
Having a reaction to Bisotrol- cough, arm and legs are shaking.   Feels dizzy.

## 2013-09-18 NOTE — Telephone Encounter (Signed)
Spoke to patient she stated she feels ok, no sob.Stated she is eating the same don't know why she has gained 3 lbs.Advised to eat a low salt diet and keep appointment with [name] NP 09/24/13.

## 2013-09-18 NOTE — Telephone Encounter (Signed)
Returned call to Roxanne with [facility] no answer.LMTC.

## 2013-09-18 NOTE — Telephone Encounter (Signed)
New problem   Andrea [name]/[facility] stated pt has gain 3lb since Tuesday. Pt doesn't have sob. Please call Andrea [name].

## 2013-09-24 ENCOUNTER — Encounter: Payer: Self-pay | Admitting: Nurse Practitioner

## 2013-09-24 ENCOUNTER — Ambulatory Visit (INDEPENDENT_AMBULATORY_CARE_PROVIDER_SITE_OTHER): Payer: BC Managed Care – PPO | Admitting: Nurse Practitioner

## 2013-09-24 VITALS — BP 120/70 | HR 80 | Ht 60.0 in | Wt 189.8 lb

## 2013-09-24 DIAGNOSIS — I255 Ischemic cardiomyopathy: Secondary | ICD-10-CM

## 2013-09-24 DIAGNOSIS — I2589 Other forms of chronic ischemic heart disease: Secondary | ICD-10-CM

## 2013-09-24 MED ORDER — BISOPROLOL FUMARATE 5 MG PO TABS: 5.0000 mg | ORAL_TABLET | Freq: Every day | ORAL | Status: DC

## 2013-09-24 NOTE — Progress Notes (Signed)
Andrea [name] Date of Birth: May 11, 1951 Medical Record #[id number]  History of Present Illness: Andrea [name] is seen back today for a 2 week visit. Seen for Dr. [place]. Has an ischemic CM with an EF of 25% - has ICD in place, CAD with stenting of the distal RCA in December of 2011. In April of 2012, she had subtotal occlusion of the proximal RCA and this was stented with a DES stent. Cath in June of 2012 showed nonobstructive disease. Was placed on Plavix 03/2013 due to the DES-RCA and has a history of being a non-responder to Plavix. However, has not tolerated Brilinta in the past due to wheezing and cannot be on Effient due to CVA history. Her other issues include mild thrombocytopenia, type 2 DM, former smoker, HLD, anxiety and depression.   Most recently admitted with dyspnea - had called 911 from home - had sats down in the 70's. Deteriorated very quickly - and was intubated. Was treated for possible CAP but no organism identified and this was felt to possibly have triggered her exacerbation. Echo showed EF still at 20%. Coreg changed to Zebeta and stopped ACE given concern for her upper airway - apparently had significant upper airway irritation and cough. Chart notes no prior ACE/ARB secondary to history of intolerance including hypotension and ARF in 2013.   I saw her 2 weeks ago post hospital - medicines were quite mixed up. She told me she was never allergic to Diovan but was allergic to Darvon - told me she was on full dose Coreg but that turned out to be wrong. We ended up starting Zebeta (because she was not on ANY beta blocker post discharge) - she took it twice a day instead of once - we cut it back to just once a day.   Comes back today. Here alone. She is doing ok. Actually seems better. No chest pain. Breathing is good. No swelling. Weight is stable. Very little cough. Notes that the Zebeta makes her a little shaky. Walking more. Not dizzy any more.    Current Outpatient  Prescriptions  Medication Sig Dispense Refill  . Acetaminophen (TYLENOL ARTHRITIS PAIN PO) Take 2 tablets by mouth as needed (pain).      Marland Kitchen atorvastatin (LIPITOR) 40 MG tablet Take 40 mg by mouth daily.      . bisoprolol (ZEBETA) 5 MG tablet Take 1 tablet (5 mg total) by mouth daily.  30 tablet  6  . buPROPion (WELLBUTRIN XL) 150 MG 24 hr tablet Take 2 tablets (300 mg total) by mouth daily.  30 tablet  9  . clopidogrel (PLAVIX) 75 MG tablet Take 1 tablet (75 mg total) by mouth daily with breakfast.  30 tablet  11  . DULoxetine (CYMBALTA) 60 MG capsule Take 60 mg by mouth daily.        . furosemide (LASIX) 20 MG tablet Take 2 tablets (40 mg total) by mouth daily.  30 tablet  11  . JANUVIA 100 MG tablet Take 100 mg by mouth Daily.       Marland Kitchen loratadine (CLARITIN) 10 MG tablet Take 10 mg by mouth as needed for allergies.      . nitroGLYCERIN (NITROSTAT) 0.4 MG SL tablet Place 0.4 mg under the tongue every 5 (five) minutes as needed for chest pain.      . pantoprazole (PROTONIX) 40 MG tablet Take 40 mg by mouth daily.      . potassium chloride SA (K-DUR,KLOR-CON) 20 MEQ tablet Take 1  tablet (20 mEq total) by mouth daily.  30 tablet  6   No current facility-administered medications for this visit.    Allergies  Allergen Reactions  . Asa [Aspirin] Shortness Of Breath  . Aspirin Shortness Of Breath and Other (See Comments)    Asthma symptoms  . Brilinta [Ticagrelor] Shortness Of Breath and Other (See Comments)    Gout   . Percocet [Oxycodone-Acetaminophen] Nausea And Vomiting  . Darvon Nausea And Vomiting  . Diovan [Valsartan] Other (See Comments)    unknown  . Imitrex [Sumatriptan Base] Other (See Comments)    Unknown reaction  . Nsaids Nausea And Vomiting  . Nsaids Other (See Comments)    GI upset     Past Medical History  Diagnosis Date  . Chronic systolic heart failure   . Hypertension   . Stroke 02/2010    left brain CVA? recurrent TIA's treated with TPA  . Dyslipidemia   .  Premature ventricular contractions (PVCs) (VPCs)   . GERD (gastroesophageal reflux disease)   . Coronary artery disease     a. DES-dRCA 11/2010. b. DES-pRCA 04/2011 (in an area free of disease on prior cath). c. s/p DES-mRCA 03/2013 for NSTEMI.  . Ischemic cardiomyopathy     a. Hx of medtronic ICD. b. EF 15-20% by cath 03/2013.  . Pulmonary hypertension   . Gout   . Obesity   . Thrombocytopenia   . Depression   . Hypotension     a. Admission 09/2012 for hypotn & ARF. b. Meds held 03/2013 due to hypotension.  . Acute renal insufficiency     a. 09/2012. b. Also noted post-cath 03/2013.  . NSTEMI (non-ST elevated myocardial infarction) 03/2013  . Anginal pain   . Asthma   . Pneumonia ~ 2001    "double" (05/07/2013)  . Shortness of breath     "can happen at anytime" (05/07/2013)  . Type II diabetes mellitus   . Microcytic anemia     a. Noted 03/2013 on labs, iron studies WNL.  Marland Kitchen Migraine headache     "no pain; aura/visual problems only; at least 2-3X/month" (05/07/2013)  . Headaches, cluster     "monthly" (05/07/2013)  . Arthritis     "knees" (05/07/2013)  . DJD (degenerative joint disease)   . Chronic lower back pain   . ICD (implantable cardiac defibrillator) in place   . Anxiety   . CHF (congestive heart failure)   . CKD (chronic kidney disease) stage 3, GFR 30-59 ml/min   . DM (diabetes mellitus)   . CVA (cerebral infarction)   . HTN (hypertension)   . Primary pulmonary HTN   . Automatic implantable cardiac defibrillator - Medtronic 08/24/2013    Past Surgical History  Procedure Laterality Date  . Mass excision      left hip  . Cervical polypectomy  1970's  . US echocardiography  2011  . Tonsillectomy  1960's?  . Right oophorectomy  1980's  . Dilation and curettage of uterus  1970's  . Ostectomy metatarsal Left 1993    "5th toe; from rickets" (05/07/2013)  . Coronary angioplasty with stent placement  2000's    "2 stents" (05/07/2013)  . Coronary angioplasty with stent placement   03/2013    PCI to RCA/notes 05/05/2013; "makes total of 3" (05/07/2013)  . Implantable cardioverter defibrillator implant      History  Smoking status  . Former Smoker -- 0.05 packs/day for 5 years  . Types: Cigarettes  . Quit date: 04/23/1982  Smokeless tobacco  . Not on file    History  Alcohol Use No    Family History  Problem Relation Age of Onset  . Heart failure Mother   . Heart attack Father   . Heart disease Brother     Review of Systems: The review of systems is per the HPI.  All other systems were reviewed and are negative.  Physical Exam: BP 120/70  Pulse 80  Ht 5' (1.524 m)  Wt 189 lb 12.8 oz (86.093 kg)  BMI 37.07 kg/m2 Patient is very pleasant and in no acute distress. Weight down 2 pounds. Skin is warm and dry. Color is normal.  HEENT is unremarkable. Normocephalic/atraumatic. PERRL. Sclera are nonicteric. Neck is supple. No masses. No JVD. Lungs are clear. Cardiac exam shows a regular rate and rhythm. Abdomen is soft. Extremities are without edema. Gait and ROM are intact. No gross neurologic deficits noted.  LABORATORY DATA:  Lab Results  Component Value Date   WBC 6.8 08/23/2013   HGB 11.1* 08/23/2013   HCT 32.7* 08/23/2013   PLT 137* 08/23/2013   GLUCOSE 186* 09/07/2013   CHOL 154 08/20/2013   TRIG 166* 08/20/2013   HDL 53 08/20/2013   LDLCALC 68 08/20/2013   ALT 18 08/19/2013   AST 39* 08/19/2013   NA 141 09/07/2013   K 4.1 09/07/2013   CL 106 09/07/2013   CREATININE 1.0 09/07/2013   BUN 19 09/07/2013   CO2 29 09/07/2013   TSH 0.870 04/26/2013   INR 1.07 08/19/2013   HGBA1C 6.4* 08/19/2013    Echo Study Conclusions from August 2014  - Left ventricle: The cavity size was normal. Wall thickness was increased in a pattern of mild LVH. The estimated ejection fraction was 20%. Diffuse hypokinesis. There is akinesis of the mid-distalanteroseptal myocardium. The study is not technically sufficient to allow evaluation of LV diastolic function. - Mitral valve: Mild  regurgitation. - Pulmonary arteries: PA peak pressure: 35mm Hg (S    CARDIAC CATH FINAL INTERPRETATION FROM June 2012:  1. Nonobstructive coronary artery disease. The stents in the right  coronary artery are widely patent.  2. Severe left ventricular dysfunction.   PLAN: I would recommend continued medical therapy. The patient has had  platelet inhibition of 0% on Plavix. She cannot take Effient due to a  prior stroke. We will place her on Brilinta. I would recommend in  future cardiac catheterizations that she could not be done through the  right radial approach given the difficulty of her procedure today.  ______________________________  Peter M. [place]il[place], M.D.   Assessment / Plan:  1. Cardiomyopathy - EF of 20% - recent CHF exacerbation - looks compensated at this time. No ACE/ARB due to prior issues with hypotension/ARF. I have left her on her current regimen. See Dr. [place] back as planned in November.   2. CAP - seems resolved. No more cough. No wheezing.   3. CAD - prior PCIs - no chest pain reported. She is currently asymptomatic. Continue with medical management.  Patient is agreeable to this plan and will call if any problems develop in the interim.   Rosalio Macadamia, RN, ANP-C  [facility]  [address]  [place], [place] [address]

## 2013-09-24 NOTE — Patient Instructions (Addendum)
See Dr. [place] in November and Dr. Ladona Ridgel in November  Stay on your current medicines but take the Zebeta at night  Call the [facility] office at [phone] if you have any questions, problems or concerns.

## 2013-11-09 ENCOUNTER — Ambulatory Visit (INDEPENDENT_AMBULATORY_CARE_PROVIDER_SITE_OTHER): Payer: BC Managed Care – PPO | Admitting: Ophthalmology

## 2013-11-19 ENCOUNTER — Encounter: Payer: Self-pay | Admitting: Cardiology

## 2013-11-19 ENCOUNTER — Ambulatory Visit (INDEPENDENT_AMBULATORY_CARE_PROVIDER_SITE_OTHER): Payer: BC Managed Care – PPO | Admitting: Cardiology

## 2013-11-19 VITALS — BP 152/94 | HR 98 | Ht 60.0 in | Wt 195.0 lb

## 2013-11-19 DIAGNOSIS — I2510 Atherosclerotic heart disease of native coronary artery without angina pectoris: Secondary | ICD-10-CM

## 2013-11-19 DIAGNOSIS — I10 Essential (primary) hypertension: Secondary | ICD-10-CM

## 2013-11-19 DIAGNOSIS — I420 Dilated cardiomyopathy: Secondary | ICD-10-CM

## 2013-11-19 DIAGNOSIS — I428 Other cardiomyopathies: Secondary | ICD-10-CM

## 2013-11-19 DIAGNOSIS — Z95810 Presence of automatic (implantable) cardiac defibrillator: Secondary | ICD-10-CM

## 2013-11-19 DIAGNOSIS — I509 Heart failure, unspecified: Secondary | ICD-10-CM

## 2013-11-19 DIAGNOSIS — Z0000 Encounter for general adult medical examination without abnormal findings: Secondary | ICD-10-CM

## 2013-11-19 DIAGNOSIS — I5022 Chronic systolic (congestive) heart failure: Secondary | ICD-10-CM

## 2013-11-19 NOTE — Patient Instructions (Signed)
Continue your current therapy  If you notice your weight increasing or retaining more fluid you may take an extra 20 mg Lasix.  I will see you in 3 months.

## 2013-11-19 NOTE — Progress Notes (Signed)
[name] Date of Birth: 1951/05/09 Medical Record #[id number]  History of Present Illness: Ms. Lampley is seen for a followup visit. 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 In April of 2012, she had subtotal occlusion of the proximal RCA and this was stented with a DES stent. Cath in June of 2012 showed nonobstructive disease. Was placed on Plavix 03/2013 due to the DES-RCA and has a history of being a non-responder to Plavix. However, has not tolerated Brilinta in the past due to wheezing and cannot be on Effient due to CVA history. Her other issues include mild thrombocytopenia, type 2 DM, former smoker, HLD, anxiety and depression.   She was admitted in August with community-acquired pneumonia associated with respiratory failure requiring ventilator support. Echo showed EF still at 20%. Coreg changed to Zebeta and stopped ACE given concern for her upper airway - apparently had significant upper airway irritation and cough. Chart notes no prior ACE/ARB secondary to history of intolerance including hypotension and ARF in 2013.   On followup today she reports that she is doing fairly well. Her weight fluctuates almost daily. Her weight is up she feels swelling in her hands. She states her breathing is doing fairly good. Blood pressure at home has been well controlled. She does complain of some dizziness if she turns quickly. She still complains of a tremor in her hands that seems to be getting worse.  Current Outpatient Prescriptions  Medication Sig Dispense Refill  . Acetaminophen (TYLENOL ARTHRITIS PAIN PO) Take 2 tablets by mouth as needed (pain).      Marland Kitchen atorvastatin (LIPITOR) 40 MG tablet Take 40 mg by mouth daily.      . bisoprolol (ZEBETA) 5 MG tablet Take 1 tablet (5 mg total) by mouth at bedtime.  30 tablet  6  . buPROPion (WELLBUTRIN XL) 150 MG 24 hr tablet Take 2 tablets (300 mg total) by mouth daily.   30 tablet  9  . clopidogrel (PLAVIX) 75 MG tablet Take 1 tablet (75 mg total) by mouth daily with breakfast.  30 tablet  11  . DULoxetine (CYMBALTA) 60 MG capsule Take 60 mg by mouth daily.        . furosemide (LASIX) 20 MG tablet Take 2 tablets (40 mg total) by mouth daily.  30 tablet  11  . JANUVIA 100 MG tablet Take 100 mg by mouth Daily.       Marland Kitchen loratadine (CLARITIN) 10 MG tablet Take 10 mg by mouth as needed for allergies.      . nitroGLYCERIN (NITROSTAT) 0.4 MG SL tablet Place 0.4 mg under the tongue every 5 (five) minutes as needed for chest pain.      . pantoprazole (PROTONIX) 40 MG tablet Take 40 mg by mouth daily.      . potassium chloride SA (K-DUR,KLOR-CON) 20 MEQ tablet Take 1 tablet (20 mEq total) by mouth daily.  30 tablet  6   No current facility-administered medications for this visit.    Allergies  Allergen Reactions  . Asa [Aspirin] Shortness Of Breath  . Aspirin Shortness Of Breath and Other (See Comments)    Asthma symptoms  . Brilinta [Ticagrelor] Shortness Of Breath and Other (See Comments)    Gout   . Percocet [Oxycodone-Acetaminophen] Nausea And Vomiting  . Darvon Nausea And Vomiting  . Diovan [Valsartan] Other (See Comments)    unknown  .  Imitrex [Sumatriptan Base] Other (See Comments)    Unknown reaction  . Nsaids Nausea And Vomiting  . Nsaids Other (See Comments)    GI upset     Past Medical History  Diagnosis Date  . Chronic systolic heart failure   . Hypertension   . Stroke 02/2010    left brain CVA? recurrent TIA's treated with TPA  . Dyslipidemia   . Premature ventricular contractions (PVCs) (VPCs)   . GERD (gastroesophageal reflux disease)   . Coronary artery disease     a. DES-dRCA 11/2010. b. DES-pRCA 04/2011 (in an area free of disease on prior cath). c. s/p DES-mRCA 03/2013 for NSTEMI.  . Ischemic cardiomyopathy     a. Hx of medtronic ICD. b. EF 15-20% by cath 03/2013.  . Pulmonary hypertension   . Gout   . Obesity   .  Thrombocytopenia   . Depression   . Hypotension     a. Admission 09/2012 for hypotn & ARF. b. Meds held 03/2013 due to hypotension.  . Acute renal insufficiency     a. 09/2012. b. Also noted post-cath 03/2013.  . NSTEMI (non-ST elevated myocardial infarction) 03/2013  . Anginal pain   . Asthma   . Pneumonia ~ 2001    "double" (05/07/2013)  . Shortness of breath     "can happen at anytime" (05/07/2013)  . Type II diabetes mellitus   . Microcytic anemia     a. Noted 03/2013 on labs, iron studies WNL.  Marland Kitchen Migraine headache     "no pain; aura/visual problems only; at least 2-3X/month" (05/07/2013)  . Headaches, cluster     "monthly" (05/07/2013)  . Arthritis     "knees" (05/07/2013)  . DJD (degenerative joint disease)   . Chronic lower back pain   . ICD (implantable cardiac defibrillator) in place   . Anxiety   . CHF (congestive heart failure)   . CKD (chronic kidney disease) stage 3, GFR 30-59 ml/min   . DM (diabetes mellitus)   . CVA (cerebral infarction)   . HTN (hypertension)   . Primary pulmonary HTN   . Automatic implantable cardiac defibrillator - Medtronic 08/24/2013    Past Surgical History  Procedure Laterality Date  . Mass excision      left hip  . Cervical polypectomy  1970's  . US echocardiography  2011  . Tonsillectomy  1960's?  . Right oophorectomy  1980's  . Dilation and curettage of uterus  1970's  . Ostectomy metatarsal Left 1993    "5th toe; from rickets" (05/07/2013)  . Coronary angioplasty with stent placement  2000's    "2 stents" (05/07/2013)  . Coronary angioplasty with stent placement  03/2013    PCI to RCA/notes 05/05/2013; "makes total of 3" (05/07/2013)  . Implantable cardioverter defibrillator implant      History  Smoking status  . Former Smoker -- 0.05 packs/day for 5 years  . Types: Cigarettes  . Quit date: 04/23/1982  Smokeless tobacco  . Not on file    History  Alcohol Use No    Family History  Problem Relation Age of Onset  . Heart failure  Mother   . Heart attack Father   . Heart disease Brother     Review of Systems: The review of systems is per the HPI.  All other systems were reviewed and are negative.  Physical Exam: BP 152/94  Pulse 98  Ht 5' (1.524 m)  Wt 195 lb (88.451 kg)  BMI 38.08 kg/m2 Patient is  very pleasant and in no acute distress. Weight 5 pounds. Skin is warm and dry. Color is normal.  HEENT is unremarkable. Normocephalic/atraumatic. PERRL. Sclera are nonicteric. Neck is supple. No masses. No JVD. Lungs are clear. Cardiac exam shows a regular rate and rhythm. Abdomen is soft. Extremities are without edema. Gait and ROM are intact. No gross neurologic deficits noted.  LABORATORY DATA:  Lab Results  Component Value Date   WBC 6.8 08/23/2013   HGB 11.1* 08/23/2013   HCT 32.7* 08/23/2013   PLT 137* 08/23/2013   GLUCOSE 186* 09/07/2013   CHOL 154 08/20/2013   TRIG 166* 08/20/2013   HDL 53 08/20/2013   LDLCALC 68 08/20/2013   ALT 18 08/19/2013   AST 39* 08/19/2013   NA 141 09/07/2013   K 4.1 09/07/2013   CL 106 09/07/2013   CREATININE 1.0 09/07/2013   BUN 19 09/07/2013   CO2 29 09/07/2013   TSH 0.870 04/26/2013   INR 1.07 08/19/2013   HGBA1C 6.4* 08/19/2013    Echo Study Conclusions from August 2014  - Left ventricle: The cavity size was normal. Wall thickness was increased in a pattern of mild LVH. The estimated ejection fraction was 20%. Diffuse hypokinesis. There is akinesis of the mid-distalanteroseptal myocardium. The study is not technically sufficient to allow evaluation of LV diastolic function. - Mitral valve: Mild regurgitation. - Pulmonary arteries: PA peak pressure: 35mm Hg (S    CARDIAC CATH FINAL INTERPRETATION FROM June 2012:  1. Nonobstructive coronary artery disease. The stents in the right  coronary artery are widely patent.  2. Severe left ventricular dysfunction.   PLAN: I would recommend continued medical therapy. The patient has had  platelet inhibition of 0% on Plavix. She cannot take  Effient due to a  prior stroke. We will place her on Brilinta. I would recommend in  future cardiac catheterizations that she could not be done through the  right radial approach given the difficulty of her procedure today.  ______________________________  Jeremie Abdelaziz M. [place], M.D.   Assessment / Plan:  1. Cardiomyopathy - EF of 20% -  weight is up today. No ACE/ARB due to prior issues with hypotension/ARF. I recommended that she continue to monitor her weights daily. If she has increased weight gain or swelling she is to take an extra 20 mg of Lasix. I will followup in 3 months.  2. CAP - seems resolved. No more cough. No wheezing. Patient is given a flu shot today.  3. CAD - prior PCIs - no chest pain reported. She is currently asymptomatic. Continue with medical management.  4. Hypertension-well controlled according to patient at home.

## 2013-12-04 ENCOUNTER — Encounter: Payer: BC Managed Care – PPO | Admitting: Internal Medicine

## 2014-01-08 ENCOUNTER — Encounter: Payer: BC Managed Care – PPO | Admitting: Internal Medicine

## 2014-01-11 ENCOUNTER — Other Ambulatory Visit: Payer: Self-pay | Admitting: Gastroenterology

## 2014-01-11 DIAGNOSIS — R1011 Right upper quadrant pain: Secondary | ICD-10-CM

## 2014-01-14 ENCOUNTER — Ambulatory Visit
Admission: RE | Admit: 2014-01-14 | Discharge: 2014-01-14 | Disposition: A | Discharge status: Home / Self Care | Payer: BC Managed Care – PPO | Source: Ambulatory Visit | Attending: Gastroenterology | Admitting: Gastroenterology

## 2014-01-14 DIAGNOSIS — R1011 Right upper quadrant pain: Secondary | ICD-10-CM

## 2014-01-17 ENCOUNTER — Emergency Department (HOSPITAL_COMMUNITY): Payer: BC Managed Care – PPO

## 2014-01-17 ENCOUNTER — Inpatient Hospital Stay (HOSPITAL_COMMUNITY)
Admission: EM | Admit: 2014-01-17 | Discharge: 2014-01-22 | DRG: 441 | Disposition: A | Discharge status: Other Acute Inpatient Hospital | Payer: BC Managed Care – PPO | Attending: Internal Medicine | Admitting: Internal Medicine

## 2014-01-17 ENCOUNTER — Encounter (HOSPITAL_COMMUNITY): Payer: Self-pay | Admitting: Emergency Medicine

## 2014-01-17 DIAGNOSIS — I10 Essential (primary) hypertension: Secondary | ICD-10-CM | POA: Diagnosis present

## 2014-01-17 DIAGNOSIS — E860 Dehydration: Secondary | ICD-10-CM | POA: Diagnosis present

## 2014-01-17 DIAGNOSIS — K7200 Acute and subacute hepatic failure without coma: Principal | ICD-10-CM | POA: Diagnosis present

## 2014-01-17 DIAGNOSIS — R110 Nausea: Secondary | ICD-10-CM

## 2014-01-17 DIAGNOSIS — Z87891 Personal history of nicotine dependence: Secondary | ICD-10-CM

## 2014-01-17 DIAGNOSIS — I252 Old myocardial infarction: Secondary | ICD-10-CM

## 2014-01-17 DIAGNOSIS — N179 Acute kidney failure, unspecified: Secondary | ICD-10-CM | POA: Diagnosis present

## 2014-01-17 DIAGNOSIS — R945 Abnormal results of liver function studies: Secondary | ICD-10-CM

## 2014-01-17 DIAGNOSIS — E872 Acidosis, unspecified: Secondary | ICD-10-CM | POA: Diagnosis present

## 2014-01-17 DIAGNOSIS — M545 Low back pain, unspecified: Secondary | ICD-10-CM | POA: Diagnosis present

## 2014-01-17 DIAGNOSIS — Z95810 Presence of automatic (implantable) cardiac defibrillator: Secondary | ICD-10-CM

## 2014-01-17 DIAGNOSIS — N183 Chronic kidney disease, stage 3 unspecified: Secondary | ICD-10-CM | POA: Diagnosis present

## 2014-01-17 DIAGNOSIS — I129 Hypertensive chronic kidney disease with stage 1 through stage 4 chronic kidney disease, or unspecified chronic kidney disease: Secondary | ICD-10-CM | POA: Diagnosis present

## 2014-01-17 DIAGNOSIS — G8929 Other chronic pain: Secondary | ICD-10-CM | POA: Diagnosis present

## 2014-01-17 DIAGNOSIS — F411 Generalized anxiety disorder: Secondary | ICD-10-CM | POA: Diagnosis present

## 2014-01-17 DIAGNOSIS — K219 Gastro-esophageal reflux disease without esophagitis: Secondary | ICD-10-CM | POA: Diagnosis present

## 2014-01-17 DIAGNOSIS — F329 Major depressive disorder, single episode, unspecified: Secondary | ICD-10-CM | POA: Diagnosis present

## 2014-01-17 DIAGNOSIS — E669 Obesity, unspecified: Secondary | ICD-10-CM | POA: Diagnosis present

## 2014-01-17 DIAGNOSIS — R34 Anuria and oliguria: Secondary | ICD-10-CM | POA: Diagnosis present

## 2014-01-17 DIAGNOSIS — I5022 Chronic systolic (congestive) heart failure: Secondary | ICD-10-CM | POA: Diagnosis present

## 2014-01-17 DIAGNOSIS — M109 Gout, unspecified: Secondary | ICD-10-CM | POA: Diagnosis present

## 2014-01-17 DIAGNOSIS — K7290 Hepatic failure, unspecified without coma: Secondary | ICD-10-CM | POA: Diagnosis present

## 2014-01-17 DIAGNOSIS — F3289 Other specified depressive episodes: Secondary | ICD-10-CM | POA: Diagnosis present

## 2014-01-17 DIAGNOSIS — E119 Type 2 diabetes mellitus without complications: Secondary | ICD-10-CM | POA: Diagnosis present

## 2014-01-17 DIAGNOSIS — IMO0002 Reserved for concepts with insufficient information to code with codable children: Secondary | ICD-10-CM | POA: Diagnosis present

## 2014-01-17 DIAGNOSIS — N289 Disorder of kidney and ureter, unspecified: Secondary | ICD-10-CM

## 2014-01-17 DIAGNOSIS — E875 Hyperkalemia: Secondary | ICD-10-CM | POA: Diagnosis not present

## 2014-01-17 DIAGNOSIS — R7989 Other specified abnormal findings of blood chemistry: Secondary | ICD-10-CM

## 2014-01-17 DIAGNOSIS — I2789 Other specified pulmonary heart diseases: Secondary | ICD-10-CM | POA: Diagnosis present

## 2014-01-17 DIAGNOSIS — E162 Hypoglycemia, unspecified: Secondary | ICD-10-CM | POA: Diagnosis present

## 2014-01-17 DIAGNOSIS — I509 Heart failure, unspecified: Secondary | ICD-10-CM | POA: Diagnosis present

## 2014-01-17 DIAGNOSIS — R627 Adult failure to thrive: Secondary | ICD-10-CM | POA: Diagnosis present

## 2014-01-17 DIAGNOSIS — I2510 Atherosclerotic heart disease of native coronary artery without angina pectoris: Secondary | ICD-10-CM | POA: Diagnosis present

## 2014-01-17 DIAGNOSIS — R1013 Epigastric pain: Secondary | ICD-10-CM

## 2014-01-17 DIAGNOSIS — I429 Cardiomyopathy, unspecified: Secondary | ICD-10-CM

## 2014-01-17 DIAGNOSIS — Z8673 Personal history of transient ischemic attack (TIA), and cerebral infarction without residual deficits: Secondary | ICD-10-CM

## 2014-01-17 DIAGNOSIS — Z7902 Long term (current) use of antithrombotics/antiplatelets: Secondary | ICD-10-CM

## 2014-01-17 DIAGNOSIS — Z9861 Coronary angioplasty status: Secondary | ICD-10-CM

## 2014-01-17 DIAGNOSIS — Z8249 Family history of ischemic heart disease and other diseases of the circulatory system: Secondary | ICD-10-CM

## 2014-01-17 DIAGNOSIS — G9340 Encephalopathy, unspecified: Secondary | ICD-10-CM | POA: Diagnosis not present

## 2014-01-17 DIAGNOSIS — I2589 Other forms of chronic ischemic heart disease: Secondary | ICD-10-CM | POA: Diagnosis present

## 2014-01-17 DIAGNOSIS — E1169 Type 2 diabetes mellitus with other specified complication: Secondary | ICD-10-CM | POA: Diagnosis present

## 2014-01-17 DIAGNOSIS — Z79899 Other long term (current) drug therapy: Secondary | ICD-10-CM

## 2014-01-17 HISTORY — DX: Cardiac murmur, unspecified: R01.1

## 2014-01-17 HISTORY — DX: Bipolar disorder, unspecified: F31.9

## 2014-01-17 LAB — CBC WITH DIFFERENTIAL/PLATELET
BASOS ABS: 0 10*3/uL (ref 0.0–0.1)
BASOS PCT: 1 % (ref 0–1)
EOS ABS: 0 10*3/uL (ref 0.0–0.7)
EOS PCT: 0 % (ref 0–5)
HEMATOCRIT: 37.1 % (ref 36.0–46.0)
Hemoglobin: 12.8 g/dL (ref 12.0–15.0)
LYMPHS PCT: 25 % (ref 12–46)
Lymphs Abs: 1.4 10*3/uL (ref 0.7–4.0)
MCH: 23.4 pg — AB (ref 26.0–34.0)
MCHC: 34.5 g/dL (ref 30.0–36.0)
MCV: 67.9 fL — AB (ref 78.0–100.0)
MONO ABS: 0.6 10*3/uL (ref 0.1–1.0)
Monocytes Relative: 10 % (ref 3–12)
Neutro Abs: 3.7 10*3/uL (ref 1.7–7.7)
Neutrophils Relative %: 65 % (ref 43–77)
Platelets: 273 10*3/uL (ref 150–400)
RBC: 5.46 MIL/uL — ABNORMAL HIGH (ref 3.87–5.11)
RDW: 18.3 % — AB (ref 11.5–15.5)
WBC: 5.8 10*3/uL (ref 4.0–10.5)

## 2014-01-17 LAB — COMPREHENSIVE METABOLIC PANEL
ALBUMIN: 2.2 g/dL — AB (ref 3.5–5.2)
ALT: 150 U/L — ABNORMAL HIGH (ref 0–35)
AST: 133 U/L — AB (ref 0–37)
Albumin: 3.7 g/dL (ref 3.5–5.2)
Alkaline Phosphatase: 111 U/L (ref 39–117)
BUN: 39 mg/dL — ABNORMAL HIGH (ref 6–23)
BUN: 30 mg/dL — ABNORMAL HIGH (ref 6–23)
CALCIUM: 8.8 mg/dL (ref 8.4–10.5)
CALCIUM: 5.7 mg/dL — AB (ref 8.4–10.5)
CO2: 19 meq/L (ref 19–32)
CO2: 14 mEq/L — ABNORMAL LOW (ref 19–32)
CREATININE: 1.3 mg/dL — AB (ref 0.50–1.10)
CREATININE: 0.87 mg/dL (ref 0.50–1.10)
Chloride: 101 mEq/L (ref 96–112)
Chloride: 116 mEq/L — ABNORMAL HIGH (ref 96–112)
GFR calc Af Amer: 81 mL/min — ABNORMAL LOW (ref >90)
GFR calc non Af Amer: 70 mL/min — ABNORMAL LOW (ref >90)
GFR, EST AFRICAN AMERICAN: 50 mL/min — AB (ref >90)
GFR, EST NON AFRICAN AMERICAN: 43 mL/min — AB (ref >90)
Glucose, Bld: 93 mg/dL (ref 70–99)
Glucose, Bld: 71 mg/dL (ref 70–99)
Potassium: 4.4 mEq/L (ref 3.7–5.3)
Sodium: 138 mEq/L (ref 137–147)
Sodium: 143 mEq/L (ref 137–147)
Total Bilirubin: 2.6 mg/dL — ABNORMAL HIGH (ref 0.3–1.2)
Total Bilirubin: 1.6 mg/dL — ABNORMAL HIGH (ref 0.3–1.2)
Total Protein: 7.1 g/dL (ref 6.0–8.3)
Total Protein: 4.3 g/dL — ABNORMAL LOW (ref 6.0–8.3)

## 2014-01-17 LAB — LIPASE, BLOOD: Lipase: 36 U/L (ref 11–59)

## 2014-01-17 LAB — GLUCOSE, CAPILLARY: GLUCOSE-CAPILLARY: 110 mg/dL — AB (ref 70–99)

## 2014-01-17 MED ORDER — ONDANSETRON HCL 4 MG/2ML IJ SOLN
4.0000 mg | Freq: Once | INTRAMUSCULAR | Status: AC
  Administered 2014-01-17: 4 mg via INTRAVENOUS
  Filled 2014-01-17: qty 2

## 2014-01-17 NOTE — ED Notes (Signed)
Lab called green tube of blood is hemolyzed need it recollected.

## 2014-01-17 NOTE — ED Provider Notes (Addendum)
CSN: [id number]     Arrival date & time 01/17/14  1607 History   First MD Initiated Contact with Patient 01/17/14 2008     Chief Complaint  Patient presents with  . Nausea  . Emesis   (Consider location/radiation/quality/duration/timing/severity/associated sxs/prior Treatment) HPI Comments: Andrea [name] is a 63 year-old female with a past medical history of MI s/p 3 stents,CHF(EF 20% and ICD), CVA, Gout, HTN, presenting the Emergency Department with a chief complaint of nausea for 3 weeks.  The patient reports she was evaluated by her Gastroenterology 01/11/2014 and had an [place] on 01/14/2014.  She reports she came to the ED because her partner was worried and wanted her to be evaluated.  She reports epigastric pain with pressure. Denies nausea or vomiting related to oral intake.  She reports diarrhea, described as loose stool for 3 weeks. Last BM this morning.  The patient also complains of intermittent hoarseness for 2 weeks. Denies blood or pus in stool.  Denes fever or chills, hematuria, or dysuria. No history of abdominal surgeries.  Cardiologist: Dr. [place] Gastroenterologist: Dr. Randa Evens   The history is provided by the patient and medical records. No language interpreter was used.    Past Medical History  Diagnosis Date  . Chronic systolic heart failure   . Hypertension   . Stroke 02/2010    left brain CVA? recurrent TIA's treated with TPA  . Dyslipidemia   . Premature ventricular contractions (PVCs) (VPCs)   . GERD (gastroesophageal reflux disease)   . Coronary artery disease     a. DES-dRCA 11/2010. b. DES-pRCA 04/2011 (in an area free of disease on prior cath). c. s/p DES-mRCA 03/2013 for NSTEMI.  . Ischemic cardiomyopathy     a. Hx of medtronic ICD. b. EF 15-20% by cath 03/2013.  . Pulmonary hypertension   . Gout   . Obesity   . Thrombocytopenia   . Depression   . Hypotension     a. Admission 09/2012 for hypotn & ARF. b. Meds held 03/2013 due to hypotension.  . Acute  renal insufficiency     a. 09/2012. b. Also noted post-cath 03/2013.  . NSTEMI (non-ST elevated myocardial infarction) 03/2013  . Anginal pain   . Asthma   . Pneumonia ~ 2001    "double" (05/07/2013)  . Shortness of breath     "can happen at anytime" (05/07/2013)  . Type II diabetes mellitus   . Microcytic anemia     a. Noted 03/2013 on labs, iron studies WNL.  Marland Kitchen Migraine headache     "no pain; aura/visual problems only; at least 2-3X/month" (05/07/2013)  . Headaches, cluster     "monthly" (05/07/2013)  . Arthritis     "knees" (05/07/2013)  . DJD (degenerative joint disease)   . Chronic lower back pain   . ICD (implantable cardiac defibrillator) in place   . Anxiety   . CHF (congestive heart failure)   . CKD (chronic kidney disease) stage 3, GFR 30-59 ml/min   . DM (diabetes mellitus)   . CVA (cerebral infarction)   . HTN (hypertension)   . Primary pulmonary HTN   . Automatic implantable cardiac defibrillator - Medtronic 08/24/2013   Past Surgical History  Procedure Laterality Date  . Mass excision      left hip  . Cervical polypectomy  1970's  . US echocardiography  2011  . Tonsillectomy  1960's?  . Right oophorectomy  1980's  . Dilation and curettage of uterus  1970's  . Ostectomy  metatarsal Left 1993    "5th toe; from rickets" (05/07/2013)  . Coronary angioplasty with stent placement  2000's    "2 stents" (05/07/2013)  . Coronary angioplasty with stent placement  03/2013    PCI to RCA/notes 05/05/2013; "makes total of 3" (05/07/2013)  . Implantable cardioverter defibrillator implant     Family History  Problem Relation Age of Onset  . Heart failure Mother   . Heart attack Father   . Heart disease Brother    History  Substance Use Topics  . Smoking status: Former Smoker -- 0.05 packs/day for 5 years    Types: Cigarettes    Quit date: 04/23/1982  . Smokeless tobacco: Not on file  . Alcohol Use: No   OB History   Grav Para Term Preterm Abortions TAB SAB Ect Mult Living                  Review of Systems  Constitutional: Positive for appetite change and fatigue. Negative for fever, chills and diaphoresis.  Respiratory: Negative for cough and shortness of breath.   Cardiovascular: Negative for chest pain, palpitations and leg swelling.  Gastrointestinal: Positive for nausea. Negative for blood in stool.  Genitourinary: Negative for dysuria, frequency, hematuria, flank pain and difficulty urinating.    Allergies  Asa; Aspirin; Brilinta; Percocet; Darvon; Diovan; Imitrex; Nsaids; and Nsaids  Home Medications   Current Outpatient Rx  Name  Route  Sig  Dispense  Refill  . Acetaminophen (TYLENOL ARTHRITIS PAIN PO)   Oral   Take 2 tablets by mouth as needed (pain).         Marland Kitchen atorvastatin (LIPITOR) 40 MG tablet   Oral   Take 40 mg by mouth daily.         Marland Kitchen buPROPion (WELLBUTRIN XL) 150 MG 24 hr tablet   Oral   Take 2 tablets (300 mg total) by mouth daily.   30 tablet   9   . clopidogrel (PLAVIX) 75 MG tablet   Oral   Take 1 tablet (75 mg total) by mouth daily with breakfast.   30 tablet   11   . DULoxetine (CYMBALTA) 60 MG capsule   Oral   Take 60 mg by mouth daily.           . furosemide (LASIX) 20 MG tablet   Oral   Take 2 tablets (40 mg total) by mouth daily.   30 tablet   11   . JANUVIA 100 MG tablet   Oral   Take 100 mg by mouth Daily.          Marland Kitchen loratadine (CLARITIN) 10 MG tablet   Oral   Take 10 mg by mouth as needed for allergies.         . nitroGLYCERIN (NITROSTAT) 0.4 MG SL tablet   Sublingual   Place 0.4 mg under the tongue every 5 (five) minutes as needed for chest pain.         . pantoprazole (PROTONIX) 40 MG tablet   Oral   Take 40 mg by mouth daily.         . potassium chloride SA (K-DUR,KLOR-CON) 20 MEQ tablet   Oral   Take 1 tablet (20 mEq total) by mouth daily.   30 tablet   6    BP 136/94  Pulse 103  Temp(Src) 97 F (36.1 C) (Oral)  Resp 18  SpO2 100% Physical Exam  Nursing note and vitals  reviewed. Constitutional: She is oriented to person,  place, and time. She appears well-developed and well-nourished. No distress.  HENT:  Head: Normocephalic and atraumatic.  Eyes: Scleral icterus is present.  Neck: Neck supple.  Cardiovascular: Regular rhythm.  Tachycardia present.   Murmur heard. No lower extremity edema  Pulmonary/Chest: Effort normal. Not tachypneic. No respiratory distress. She has no decreased breath sounds. She has no wheezes. She has no rhonchi.  Abdominal: Soft. Normal appearance and bowel sounds are normal. She exhibits no distension. There is tenderness in the epigastric area. There is no rigidity, no rebound, no guarding and negative Murphy's sign.  Mild epigastric pain with palpation.   Neurological: She is alert and oriented to person, place, and time.  Skin: Skin is warm and dry. No rash noted.  Psychiatric: She has a normal mood and affect. Her behavior is normal.    ED Course  Procedures (including critical care time) Labs Review Labs Reviewed  CBC WITH DIFFERENTIAL - Abnormal; Notable for the following:    RBC 5.46 (*)    MCV 67.9 (*)    MCH 23.4 (*)    RDW 18.3 (*)    All other components within normal limits  COMPREHENSIVE METABOLIC PANEL - Abnormal; Notable for the following:    BUN 39 (*)    Creatinine, Ser 1.30 (*)    Total Bilirubin 2.6 (*)    GFR calc non Af Amer 43 (*)    GFR calc Af Amer 50 (*)    All other components within normal limits  GLUCOSE, CAPILLARY - Abnormal; Notable for the following:    Glucose-Capillary 110 (*)    All other components within normal limits  COMPREHENSIVE METABOLIC PANEL - Abnormal; Notable for the following:    Chloride 116 (*)    CO2 14 (*)    BUN 30 (*)    Calcium 5.7 (*)    Total Protein 4.3 (*)    Albumin 2.2 (*)    AST 133 (*)    ALT 150 (*)    Total Bilirubin 1.6 (*)    GFR calc non Af Amer 70 (*)    GFR calc Af Amer 81 (*)    All other components within normal limits  LIPASE, BLOOD   URINALYSIS, ROUTINE W REFLEX MICROSCOPIC  COMPREHENSIVE METABOLIC PANEL   Imaging Review Dg Chest 2 View  01/17/2014   CLINICAL DATA:  Diffuse chest pain and shortness of breath on exertion. Nausea and vomiting. History of smoking and asthma.  EXAM: CHEST  2 VIEW  COMPARISON:  DG CHEST 2 VIEW dated 05/05/2013; CT ANGIO CHEST W/CM &/OR WO/CM dated 07/13/2010  FINDINGS: The lungs are well-aerated. A small right pleural effusion is noted. Mild medial right basilar airspace opacity may reflect atelectasis or pneumonia. There is no evidence of pleural effusion or pneumothorax.  The heart is enlarged. An AICD is seen at the left chest wall, with a single lead ending at the right ventricle. No acute osseous abnormalities are seen. Cervical spinal fusion hardware is noted.  IMPRESSION: 1. Small right pleural effusion noted. Mild medial right basilar airspace opacity may reflect atelectasis or pneumonia. 2. Cardiomegaly noted.   Electronically Signed   By: Roanna RaiderJeffery  Chang M.D.   On: 01/17/2014 22:19    US Abdomen Limited RUQ (Final result)  Result time: 01/18/14 00:49:16    Procedure changed from US Abdomen Complete       Final result by Rad Results In Interface (01/18/14 00:49:16)    Narrative:   CLINICAL DATA: Elevated LFTs; epigastric abdominal pain.  EXAM: US ABDOMEN LIMITED - RIGHT UPPER QUADRANT  COMPARISON: None.  FINDINGS: Gallbladder  No gallstones or wall thickening visualized. The gallbladder wall is borderline normal in thickness. No sonographic Murphy sign noted.  Common bile duct  Diameter: 0.4 cm, within normal limits in caliber  Liver:  No focal lesion identified. Within normal limits in parenchymal echogenicity.  Trace ascites is noted superior to the left hepatic lobe and inferior to the right hepatic lobe.  IMPRESSION: 1. Gallbladder unremarkable in appearance. 2. Trace ascites incidentally noted about the liver, of uncertain significance.   Electronically  Signed By: Roanna Raider M.D. On: 01/18/2014 00:49    EKG Interpretation    Date/Time:  Sunday January 17 2014 20:52:08 EST Ventricular Rate:  101 PR Interval:  158 QRS Duration: 110 QT Interval:  377 QTC Calculation: 489 R Axis:   8 Text Interpretation:  Sinus tachycardia Probable left atrial enlargement Nonspecific T abnormalities, lateral leads Borderline prolonged QT interval Baseline wander in lead(s) I III aVL V1 When compared with ECG of 05/06/2013 No significant change was found Confirmed by [facility]  MD, KATHLEEN ([id number]) on 01/17/2014 9:14:25 PM            MDM   1. Elevated LFTs   2. Epigastric pain   3. Nausea    Pt presents with nausea for 3 weeks, mild tenderness to palpation of epigastric, negative Murphy's, EMR shows [place] 01/14/2014, without abnormalities. Labs sent and nausea medication given.  XR reveals small pleural effusion and possible mid right pneumonia. Ca, 5.7 pt's Corrected Ca 7.14.  Elevated Bilirubin, AST and ALT. Will rule out cholelithiasis, Abdominal US ordered.  Discussed patient history, condition, and labs with Dr. Clarene Duke who agrees the patient needs evaluation for lab abnormalities and further testing.  Discussed patient history and condition with Dr. Adela Glimpse who will evaluate the patient in the ED and will place orders. Re-eval patient reports nausea improved, denies abdominal pain. Discussed lab results, imaging results, and treatment plan with the patient which includes admission. Reports understanding and no other concerns at this time.   Meds given in ED:  Medications  0.9 %  sodium chloride infusion ( Intravenous Stopped 01/18/14 0124)  ondansetron (ZOFRAN) injection 4 mg (4 mg Intravenous Given 01/17/14 2057)    New Prescriptions   No medications on file          Clabe Seal, PA-C 01/18/14 0149  Clabe Seal, PA-C 01/18/14 [phone]

## 2014-01-17 NOTE — ED Notes (Signed)
Pt presents to department via [facility] for evaluation of nausea/vomiting. Ongoing for several weeks. Also states RUQ abdominal pain. Pt conscious alert and oriented x4. No signs of acute distress noted.

## 2014-01-17 NOTE — ED Notes (Signed)
Pt states that she is tired all the time

## 2014-01-18 ENCOUNTER — Inpatient Hospital Stay (HOSPITAL_COMMUNITY): Payer: BC Managed Care – PPO

## 2014-01-18 ENCOUNTER — Encounter (HOSPITAL_COMMUNITY): Payer: Self-pay | Admitting: Internal Medicine

## 2014-01-18 DIAGNOSIS — E162 Hypoglycemia, unspecified: Secondary | ICD-10-CM | POA: Diagnosis present

## 2014-01-18 DIAGNOSIS — I5022 Chronic systolic (congestive) heart failure: Secondary | ICD-10-CM

## 2014-01-18 DIAGNOSIS — I509 Heart failure, unspecified: Secondary | ICD-10-CM

## 2014-01-18 DIAGNOSIS — E119 Type 2 diabetes mellitus without complications: Secondary | ICD-10-CM

## 2014-01-18 DIAGNOSIS — IMO0002 Reserved for concepts with insufficient information to code with codable children: Secondary | ICD-10-CM | POA: Diagnosis present

## 2014-01-18 DIAGNOSIS — N179 Acute kidney failure, unspecified: Secondary | ICD-10-CM

## 2014-01-18 DIAGNOSIS — R110 Nausea: Secondary | ICD-10-CM

## 2014-01-18 DIAGNOSIS — R7989 Other specified abnormal findings of blood chemistry: Secondary | ICD-10-CM

## 2014-01-18 DIAGNOSIS — E860 Dehydration: Secondary | ICD-10-CM

## 2014-01-18 LAB — BASIC METABOLIC PANEL
BUN: 48 mg/dL — ABNORMAL HIGH (ref 6–23)
BUN: 50 mg/dL — ABNORMAL HIGH (ref 6–23)
CHLORIDE: 104 meq/L (ref 96–112)
CO2: 22 mEq/L (ref 19–32)
CO2: 21 mEq/L (ref 19–32)
Calcium: 9.3 mg/dL (ref 8.4–10.5)
Calcium: 9.1 mg/dL (ref 8.4–10.5)
Chloride: 102 mEq/L (ref 96–112)
Creatinine, Ser: 1.66 mg/dL — ABNORMAL HIGH (ref 0.50–1.10)
Creatinine, Ser: 1.73 mg/dL — ABNORMAL HIGH (ref 0.50–1.10)
GFR calc Af Amer: 37 mL/min — ABNORMAL LOW (ref >90)
GFR calc Af Amer: 35 mL/min — ABNORMAL LOW (ref >90)
GFR calc non Af Amer: 32 mL/min — ABNORMAL LOW (ref >90)
GFR calc non Af Amer: 30 mL/min — ABNORMAL LOW (ref >90)
GLUCOSE: 135 mg/dL — AB (ref 70–99)
Glucose, Bld: 88 mg/dL (ref 70–99)
POTASSIUM: 5.3 meq/L (ref 3.7–5.3)
Potassium: 6 mEq/L — ABNORMAL HIGH (ref 3.7–5.3)
SODIUM: 139 meq/L (ref 137–147)
SODIUM: 141 meq/L (ref 137–147)

## 2014-01-18 LAB — HEPATITIS PANEL, ACUTE
HCV Ab: NEGATIVE
HEP A IGM: NONREACTIVE
Hep B C IgM: NONREACTIVE
Hepatitis B Surface Ag: NEGATIVE

## 2014-01-18 LAB — GLUCOSE, CAPILLARY
Glucose-Capillary: 76 mg/dL (ref 70–99)
Glucose-Capillary: 92 mg/dL (ref 70–99)
Glucose-Capillary: 156 mg/dL — ABNORMAL HIGH (ref 70–99)
Glucose-Capillary: 120 mg/dL — ABNORMAL HIGH (ref 70–99)
Glucose-Capillary: 83 mg/dL (ref 70–99)
Glucose-Capillary: 85 mg/dL (ref 70–99)
Glucose-Capillary: 112 mg/dL — ABNORMAL HIGH (ref 70–99)

## 2014-01-18 LAB — CBC
HEMATOCRIT: 36.9 % (ref 36.0–46.0)
HEMOGLOBIN: 12.6 g/dL (ref 12.0–15.0)
MCH: 23.2 pg — ABNORMAL LOW (ref 26.0–34.0)
MCHC: 34.1 g/dL (ref 30.0–36.0)
MCV: 68 fL — AB (ref 78.0–100.0)
Platelets: 206 10*3/uL (ref 150–400)
RBC: 5.43 MIL/uL — ABNORMAL HIGH (ref 3.87–5.11)
RDW: 18.4 % — AB (ref 11.5–15.5)
WBC: 6.7 10*3/uL (ref 4.0–10.5)

## 2014-01-18 LAB — COMPREHENSIVE METABOLIC PANEL
ALBUMIN: 3.6 g/dL (ref 3.5–5.2)
ALK PHOS: 184 U/L — AB (ref 39–117)
ALT: 360 U/L — AB (ref 0–35)
AST: 383 U/L — ABNORMAL HIGH (ref 0–37)
BUN: 46 mg/dL — ABNORMAL HIGH (ref 6–23)
CALCIUM: 9.2 mg/dL (ref 8.4–10.5)
CO2: 17 mEq/L — ABNORMAL LOW (ref 19–32)
Chloride: 101 mEq/L (ref 96–112)
Creatinine, Ser: 1.56 mg/dL — ABNORMAL HIGH (ref 0.50–1.10)
GFR calc non Af Amer: 35 mL/min — ABNORMAL LOW (ref >90)
GFR, EST AFRICAN AMERICAN: 40 mL/min — AB (ref >90)
Glucose, Bld: 154 mg/dL — ABNORMAL HIGH (ref 70–99)
POTASSIUM: 6 meq/L — AB (ref 3.7–5.3)
SODIUM: 138 meq/L (ref 137–147)
Total Bilirubin: 2.8 mg/dL — ABNORMAL HIGH (ref 0.3–1.2)
Total Protein: 6.8 g/dL (ref 6.0–8.3)

## 2014-01-18 LAB — TSH: TSH: 0.969 u[IU]/mL (ref 0.350–4.500)

## 2014-01-18 LAB — ANA: Anti Nuclear Antibody(ANA): NEGATIVE

## 2014-01-18 LAB — MAGNESIUM: MAGNESIUM: 2 mg/dL (ref 1.5–2.5)

## 2014-01-18 LAB — HEMOGLOBIN A1C
HEMOGLOBIN A1C: 6.4 % — AB (ref <5.7)
MEAN PLASMA GLUCOSE: 137 mg/dL — AB (ref <117)

## 2014-01-18 LAB — PHOSPHORUS: PHOSPHORUS: 5.3 mg/dL — AB (ref 2.3–4.6)

## 2014-01-18 LAB — SEDIMENTATION RATE: SED RATE: 2 mm/h (ref 0–22)

## 2014-01-18 MED ORDER — SODIUM CHLORIDE 0.9 % IJ SOLN: 3.0000 mL | INTRAMUSCULAR | Status: DC | PRN

## 2014-01-18 MED ORDER — THIAMINE HCL 100 MG/ML IJ SOLN
100.0000 mg | Freq: Every day | INTRAMUSCULAR | Status: DC
  Administered 2014-01-18: 100 mg via INTRAVENOUS
  Filled 2014-01-18 (×2): qty 1

## 2014-01-18 MED ORDER — BUPROPION HCL ER (XL) 300 MG PO TB24
300.0000 mg | ORAL_TABLET | Freq: Every day | ORAL | Status: DC
  Administered 2014-01-18 – 2014-01-20 (×3): 300 mg via ORAL
  Filled 2014-01-18 (×4): qty 1

## 2014-01-18 MED ORDER — SODIUM CHLORIDE 0.9 % IV SOLN: 250.0000 mL | INTRAVENOUS | Status: DC | PRN

## 2014-01-18 MED ORDER — IOHEXOL 300 MG/ML  SOLN
25.0000 mL | INTRAMUSCULAR | Status: AC
  Administered 2014-01-18 (×2): 25 mL via ORAL

## 2014-01-18 MED ORDER — ONDANSETRON HCL 4 MG PO TABS: 4.0000 mg | ORAL_TABLET | Freq: Four times a day (QID) | ORAL | Status: DC | PRN

## 2014-01-18 MED ORDER — DOCUSATE SODIUM 100 MG PO CAPS
100.0000 mg | ORAL_CAPSULE | Freq: Two times a day (BID) | ORAL | Status: DC
  Administered 2014-01-18 – 2014-01-22 (×7): 100 mg via ORAL
  Filled 2014-01-18 (×10): qty 1

## 2014-01-18 MED ORDER — SODIUM CHLORIDE 0.9 % IJ SOLN
3.0000 mL | Freq: Two times a day (BID) | INTRAMUSCULAR | Status: DC
  Administered 2014-01-18 – 2014-01-22 (×6): 3 mL via INTRAVENOUS

## 2014-01-18 MED ORDER — SODIUM POLYSTYRENE SULFONATE 15 GM/60ML PO SUSP
60.0000 g | Freq: Once | ORAL | Status: AC
  Administered 2014-01-18: 60 g via ORAL
  Filled 2014-01-18: qty 240

## 2014-01-18 MED ORDER — SODIUM CHLORIDE 0.9 % IJ SOLN
3.0000 mL | Freq: Two times a day (BID) | INTRAMUSCULAR | Status: DC
  Administered 2014-01-18 – 2014-01-22 (×5): 3 mL via INTRAVENOUS

## 2014-01-18 MED ORDER — SODIUM POLYSTYRENE SULFONATE 15 GM/60ML PO SUSP
30.0000 g | Freq: Once | ORAL | Status: AC
  Administered 2014-01-18: 30 g via ORAL
  Filled 2014-01-18 (×2): qty 120

## 2014-01-18 MED ORDER — GLUCOSE 40 % PO GEL
1.0000 | ORAL | Status: DC | PRN
Start: 2014-01-18 — End: 2014-01-23

## 2014-01-18 MED ORDER — HEPARIN SODIUM (PORCINE) 5000 UNIT/ML IJ SOLN
5000.0000 [IU] | Freq: Three times a day (TID) | INTRAMUSCULAR | Status: DC
Start: 2014-01-18 — End: 2014-01-22
  Administered 2014-01-18 – 2014-01-22 (×12): 5000 [IU] via SUBCUTANEOUS
  Filled 2014-01-18 (×15): qty 1

## 2014-01-18 MED ORDER — PANTOPRAZOLE SODIUM 40 MG PO TBEC
40.0000 mg | DELAYED_RELEASE_TABLET | Freq: Every day | ORAL | Status: DC
  Administered 2014-01-18 – 2014-01-21 (×4): 40 mg via ORAL
  Filled 2014-01-18 (×4): qty 1

## 2014-01-18 MED ORDER — SODIUM CHLORIDE 0.9 % IV SOLN
INTRAVENOUS | Status: DC
  Administered 2014-01-18: 01:00:00 via INTRAVENOUS

## 2014-01-18 MED ORDER — GLUCOSE-VITAMIN C 4-6 GM-MG PO CHEW: 4.0000 | CHEWABLE_TABLET | ORAL | Status: DC | PRN

## 2014-01-18 MED ORDER — SODIUM BICARBONATE 8.4 % IV SOLN
INTRAVENOUS | Status: AC
  Administered 2014-01-18: 50 meq
  Filled 2014-01-18: qty 50

## 2014-01-18 MED ORDER — SODIUM CHLORIDE 0.9 % IV SOLN
INTRAVENOUS | Status: DC
  Administered 2014-01-18 – 2014-01-20 (×2): via INTRAVENOUS

## 2014-01-18 MED ORDER — CLOPIDOGREL BISULFATE 75 MG PO TABS
75.0000 mg | ORAL_TABLET | Freq: Every day | ORAL | Status: DC
  Administered 2014-01-18 – 2014-01-21 (×4): 75 mg via ORAL
  Filled 2014-01-18 (×5): qty 1

## 2014-01-18 MED ORDER — DEXTROSE 50 % IV SOLN: 50.0000 mL | Freq: Once | INTRAVENOUS | Status: AC | PRN

## 2014-01-18 MED ORDER — ENOXAPARIN SODIUM 40 MG/0.4ML ~~LOC~~ SOLN
40.0000 mg | SUBCUTANEOUS | Status: DC
  Administered 2014-01-18: 40 mg via SUBCUTANEOUS
  Filled 2014-01-18: qty 0.4

## 2014-01-18 MED ORDER — SODIUM CHLORIDE 0.9 % IV SOLN
1.0000 g | Freq: Once | INTRAVENOUS | Status: AC
  Administered 2014-01-18: 1 g via INTRAVENOUS
  Filled 2014-01-18: qty 10

## 2014-01-18 MED ORDER — LORATADINE 10 MG PO TABS
10.0000 mg | ORAL_TABLET | ORAL | Status: DC | PRN
  Filled 2014-01-18: qty 1

## 2014-01-18 MED ORDER — BISOPROLOL FUMARATE 5 MG PO TABS
5.0000 mg | ORAL_TABLET | Freq: Every day | ORAL | Status: DC
  Administered 2014-01-18 – 2014-01-22 (×5): 5 mg via ORAL
  Filled 2014-01-18 (×5): qty 1

## 2014-01-18 MED ORDER — DEXTROSE 50 % IV SOLN: 25.0000 mL | Freq: Once | INTRAVENOUS | Status: AC | PRN

## 2014-01-18 MED ORDER — ONDANSETRON HCL 4 MG/2ML IJ SOLN: 4.0000 mg | Freq: Four times a day (QID) | INTRAMUSCULAR | Status: DC | PRN

## 2014-01-18 MED ORDER — SODIUM BICARBONATE 8.4 % IV SOLN
50.0000 meq | Freq: Once | INTRAVENOUS | Status: AC
  Administered 2014-01-18: 50 meq via INTRAVENOUS
  Filled 2014-01-18 (×2): qty 50

## 2014-01-18 MED ORDER — FOLIC ACID 5 MG/ML IJ SOLN
1.0000 mg | Freq: Every day | INTRAMUSCULAR | Status: DC
  Administered 2014-01-18: 1 mg via INTRAVENOUS
  Filled 2014-01-18 (×2): qty 0.2

## 2014-01-18 MED ORDER — INSULIN ASPART 100 UNIT/ML ~~LOC~~ SOLN
0.0000 [IU] | SUBCUTANEOUS | Status: DC
  Administered 2014-01-18: 2 [IU] via SUBCUTANEOUS
  Administered 2014-01-19: 1 [IU] via SUBCUTANEOUS
  Administered 2014-01-19: 2 [IU] via SUBCUTANEOUS
  Administered 2014-01-20: 21:00:00 via SUBCUTANEOUS
  Administered 2014-01-21 (×2): 1 [IU] via SUBCUTANEOUS
  Administered 2014-01-21: 2 [IU] via SUBCUTANEOUS
  Administered 2014-01-22 (×2): 1 [IU] via SUBCUTANEOUS
  Administered 2014-01-22: 2 [IU] via SUBCUTANEOUS

## 2014-01-18 MED ORDER — DULOXETINE HCL 60 MG PO CPEP
60.0000 mg | ORAL_CAPSULE | Freq: Every day | ORAL | Status: DC
  Administered 2014-01-18 – 2014-01-20 (×3): 60 mg via ORAL
  Filled 2014-01-18 (×4): qty 1

## 2014-01-18 NOTE — Progress Notes (Signed)
Pt. Arrived to unit via stretcher in alert and stable condition. No s/s of distress or discomfort noted. Pt. Denies pain or any nausea at this time. Pt. Oriented to room and call light placed within reach. Pt. Placed on telemetry. CMT notified.  CGB obtained. VSS. RN will continue to monitor pt. For changes in condition.  Lennon Boutwell, Cheryll Dessert

## 2014-01-18 NOTE — H&P (Addendum)
PCP:  Lolita Patella, MD  Cardiology [facility]  Chief Complaint:  Nausea/ vomiting diarrhea poor PO intake  HPI: Andrea [name] is a 63 y.o. female   has a past medical history of Chronic systolic heart failure; Hypertension; Stroke (02/2010); Dyslipidemia; Premature ventricular contractions (PVCs) (VPCs); GERD (gastroesophageal reflux disease); Coronary artery disease; Ischemic cardiomyopathy; Pulmonary hypertension; Gout; Obesity; Thrombocytopenia; Depression; Hypotension; Acute renal insufficiency; NSTEMI (non-ST elevated myocardial infarction) (03/2013); Anginal pain; Asthma; Pneumonia (~ 2001); Shortness of breath; Type II diabetes mellitus; Microcytic anemia; Migraine headache; Headaches, cluster; Arthritis; DJD (degenerative joint disease); Chronic lower back pain; ICD (implantable cardiac defibrillator) in place; Anxiety; CHF (congestive heart failure); CKD (chronic kidney disease) stage 3, GFR 30-59 ml/min; DM (diabetes mellitus); CVA (cerebral infarction); HTN (hypertension); Primary pulmonary HTN; and Automatic implantable cardiac defibrillator - Medtronic (08/24/2013).   Presented with  Patient states she have not felt well since Christmas. Reports 4 weeks of nausea, vomiting and decreased PO intake, Loose stools but not frequent. Denies any fever or chills. Patient feels hot all the time. She went to see GI 4 days ago had an US done that she states was WNL. She was supposed to follow up with cardiology next week but stes she is tired of feeling bad and came to ER.  In ER she was noted to be tachycardiac. Hypoglycemia down to 76. Ca of 5.7 and albumin of 2.2 She was also noted to have liver function tests elevated. [place] was done only showing trace ascites but no gallbluder abnormality. Patient denies taking any tylenol recently. Of note she had a gastric emptying study done in 2013 which was within normal limits.  Hospitalist called for an admission  Review of Systems:     Pertinent positives include: fatigue, nausea, vomiting,  Constitutional:  No weight loss, night sweats, Fevers, chills, weight loss  HEENT:  No headaches, Difficulty swallowing,Tooth/dental problems,Sore throat,  No sneezing, itching, ear ache, nasal congestion, post nasal drip,  Cardio-vascular:  No chest pain, Orthopnea, PND, anasarca, dizziness, palpitations.no Bilateral lower extremity swelling  GI:  No heartburn, indigestion, abdominal pain,  diarrhea, change in bowel habits, loss of appetite, melena, blood in stool, hematemesis Resp:  no shortness of breath at rest. No dyspnea on exertion, No excess mucus, no productive cough, No non-productive cough, No coughing up of blood.No change in color of mucus.No wheezing. Skin:  no rash or lesions. No jaundice GU:  no dysuria, change in color of urine, no urgency or frequency. No straining to urinate.  No flank pain.  Musculoskeletal:  No joint pain or no joint swelling. No decreased range of motion. No back pain.  Psych:  No change in mood or affect. No depression or anxiety. No memory loss.  Neuro: no localizing neurological complaints, no tingling, no weakness, no double vision, no gait abnormality, no slurred speech, no confusion  Otherwise ROS are negative except for above, 10 systems were reviewed  Past Medical History: Past Medical History  Diagnosis Date  . Chronic systolic heart failure   . Hypertension   . Stroke 02/2010    left brain CVA? recurrent TIA's treated with TPA  . Dyslipidemia   . Premature ventricular contractions (PVCs) (VPCs)   . GERD (gastroesophageal reflux disease)   . Coronary artery disease     a. DES-dRCA 11/2010. b. DES-pRCA 04/2011 (in an area free of disease on prior cath). c. s/p DES-mRCA 03/2013 for NSTEMI.  . Ischemic cardiomyopathy     a. Hx of medtronic ICD.  b. EF 15-20% by cath 03/2013.  . Pulmonary hypertension   . Gout   . Obesity   . Thrombocytopenia   . Depression   .  Hypotension     a. Admission 09/2012 for hypotn & ARF. b. Meds held 03/2013 due to hypotension.  . Acute renal insufficiency     a. 09/2012. b. Also noted post-cath 03/2013.  . NSTEMI (non-ST elevated myocardial infarction) 03/2013  . Anginal pain   . Asthma   . Pneumonia ~ 2001    "double" (05/07/2013)  . Shortness of breath     "can happen at anytime" (05/07/2013)  . Type II diabetes mellitus   . Microcytic anemia     a. Noted 03/2013 on labs, iron studies WNL.  Marland Kitchen Migraine headache     "no pain; aura/visual problems only; at least 2-3X/month" (05/07/2013)  . Headaches, cluster     "monthly" (05/07/2013)  . Arthritis     "knees" (05/07/2013)  . DJD (degenerative joint disease)   . Chronic lower back pain   . ICD (implantable cardiac defibrillator) in place   . Anxiety   . CHF (congestive heart failure)   . CKD (chronic kidney disease) stage 3, GFR 30-59 ml/min   . DM (diabetes mellitus)   . CVA (cerebral infarction)   . HTN (hypertension)   . Primary pulmonary HTN   . Automatic implantable cardiac defibrillator - Medtronic 08/24/2013   Past Surgical History  Procedure Laterality Date  . Mass excision      left hip  . Cervical polypectomy  1970's  . US echocardiography  2011  . Tonsillectomy  1960's?  . Right oophorectomy  1980's  . Dilation and curettage of uterus  1970's  . Ostectomy metatarsal Left 1993    "5th toe; from rickets" (05/07/2013)  . Coronary angioplasty with stent placement  2000's    "2 stents" (05/07/2013)  . Coronary angioplasty with stent placement  03/2013    PCI to RCA/notes 05/05/2013; "makes total of 3" (05/07/2013)  . Implantable cardioverter defibrillator implant       Medications: Prior to Admission medications   Medication Sig Start Date End Date Taking? Authorizing Provider  Acetaminophen (TYLENOL ARTHRITIS PAIN PO) Take 2 tablets by mouth as needed (pain).   Yes Historical Provider, MD  atorvastatin (LIPITOR) 40 MG tablet Take 40 mg by mouth daily.   Yes  Historical Provider, MD  buPROPion (WELLBUTRIN XL) 150 MG 24 hr tablet Take 2 tablets (300 mg total) by mouth daily. 09/07/13  Yes Rosalio Macadamia, NP  clopidogrel (PLAVIX) 75 MG tablet Take 1 tablet (75 mg total) by mouth daily with breakfast. 04/29/13  Yes Dayna N Dunn, PA-C  DULoxetine (CYMBALTA) 60 MG capsule Take 60 mg by mouth daily.     Yes Historical Provider, MD  furosemide (LASIX) 20 MG tablet Take 2 tablets (40 mg total) by mouth daily. 09/07/13  Yes Rosalio Macadamia, NP  JANUVIA 100 MG tablet Take 100 mg by mouth Daily.  03/31/11  Yes Historical Provider, MD  loratadine (CLARITIN) 10 MG tablet Take 10 mg by mouth as needed for allergies.   Yes Historical Provider, MD  nitroGLYCERIN (NITROSTAT) 0.4 MG SL tablet Place 0.4 mg under the tongue every 5 (five) minutes as needed for chest pain.   Yes Historical Provider, MD  pantoprazole (PROTONIX) 40 MG tablet Take 40 mg by mouth daily.   Yes Historical Provider, MD  potassium chloride SA (K-DUR,KLOR-CON) 20 MEQ tablet Take 1 tablet (20  mEq total) by mouth daily. 08/25/13  Yes Simonne Martinet, NP    Allergies:   Allergies  Allergen Reactions  . Asa [Aspirin] Shortness Of Breath  . Aspirin Shortness Of Breath and Other (See Comments)    Asthma symptoms  . Brilinta [Ticagrelor] Shortness Of Breath and Other (See Comments)    Gout   . Percocet [Oxycodone-Acetaminophen] Nausea And Vomiting  . Darvon Nausea And Vomiting  . Diovan [Valsartan] Other (See Comments)    unknown  . Imitrex [Sumatriptan Base] Other (See Comments)    Unknown reaction  . Nsaids Nausea And Vomiting  . Nsaids Other (See Comments)    GI upset     Social History:  Ambulatory   Independently  Lives at  Home with family   reports that she quit smoking about 31 years ago. Her smoking use included Cigarettes. She has a .25 pack-year smoking history. She does not have any smokeless tobacco history on file. She reports that she does not drink alcohol or use illicit  drugs.   Family History: family history includes Heart attack in her father; Heart disease in her brother; Heart failure in her mother.    Physical Exam: Patient Vitals for the past 24 hrs:  BP Temp Temp src Pulse Resp SpO2  01/17/14 2345 - - - 102 25 98 %  01/17/14 2011 - - - - - 100 %  01/17/14 2008 136/94 mmHg - - 103 - 100 %  01/17/14 1855 135/94 mmHg 97 F (36.1 C) Oral 102 18 97 %  01/17/14 1609 120/86 mmHg 97.4 F (36.3 C) Oral 97 18 98 %    1. General:  in No Acute distress 2. Psychological: Alert and   Oriented 3. Head/ENT:   dry Mucous Membranes                          Head Non traumatic, neck supple                          Normal   Dentition 4. SKIN:   decreased Skin turgor,  Skin clean Dry and intact no rash 5. Heart: Regular rate and rhythm no Murmur, Rub or gallop 6. Lungs:  no wheezes or crackles  diminished air sounds at the bases 7. Abdomen: Soft, epigastric tenderness, Non distended, hepatomegaly suspected 8. Lower extremities: no clubbing, cyanosis, or edema 9. Neurologically Grossly intact, moving all 4 extremities equally 10. MSK: Normal range of motion  body mass index is unknown because there is no weight on file.   Labs on Admission:   Recent Labs  01/17/14 2026 01/17/14 2212  NA 138 143  K NOT DONE 4.4  CL 101 116*  CO2 19 14*  GLUCOSE 93 71  BUN 39* 30*  CREATININE 1.30* 0.87  CALCIUM 8.8 5.7*    Recent Labs  01/17/14 2026 01/17/14 2212  AST NOT DONE 133*  ALT NOT DONE 150*  ALKPHOS NOT DONE 111  BILITOT 2.6* 1.6*  PROT 7.1 4.3*  ALBUMIN 3.7 2.2*    Recent Labs  01/17/14 2026  LIPASE 36    Recent Labs  01/17/14 2026  WBC 5.8  NEUTROABS 3.7  HGB 12.8  HCT 37.1  MCV 67.9*  PLT 273   No results found for this basename: CKTOTAL, CKMB, CKMBINDEX, TROPONINI,  in the last 72 hours No results found for this basename: TSH, T4TOTAL, FREET3, T3FREE, THYROIDAB,  in the  last 72 hours No results found for this basename:  VITAMINB12, FOLATE, FERRITIN, TIBC, IRON, RETICCTPCT,  in the last 72 hours Lab Results  Component Value Date   HGBA1C 6.4* 08/19/2013    The CrCl is unknown because both a height and weight (above a minimum accepted value) are required for this calculation. ABG    Component Value Date/Time   PHART 7.441 08/20/2013 0343   HCO3 25.6* 08/20/2013 0343   TCO2 26.7 08/20/2013 0343   ACIDBASEDEF 6.0* 08/19/2013 1035   O2SAT 99.1 08/20/2013 0343     Lab Results  Component Value Date   DDIMER  Value: 0.46        AT THE INHOUSE ESTABLISHED CUTOFF VALUE OF 0.48 ug/mL FEU, THIS ASSAY HAS BEEN DOCUMENTED IN THE LITERATURE TO HAVE A SENSITIVITY AND NEGATIVE PREDICTIVE VALUE OF AT LEAST 98 TO 99%.  THE TEST RESULT SHOULD BE CORRELATED WITH AN ASSESSMENT OF THE CLINICAL PROBABILITY OF DVT / VTE. 12/13/2010     Other results:  I have pearsonaly reviewed this: ECG REPORT  Rate: 101  Rhythm: ST ST&T Change: no ischemia  Cultures:    Component Value Date/Time   SDES URINE, CATHETERIZED 08/19/2013 1242   SPECREQUEST NONE 08/19/2013 1242   CULT  Value: NO GROWTH 5 DAYS Performed at [facility] 08/19/2013 1110   REPTSTATUS 08/20/2013 FINAL 08/19/2013 1242       Radiological Exams on Admission: Dg Chest 2 View  01/17/2014   CLINICAL DATA:  Diffuse chest pain and shortness of breath on exertion. Nausea and vomiting. History of smoking and asthma.  EXAM: CHEST  2 VIEW  COMPARISON:  DG CHEST 2 VIEW dated 05/05/2013; CT ANGIO CHEST W/CM &/OR WO/CM dated 07/13/2010  FINDINGS: The lungs are well-aerated. A small right pleural effusion is noted. Mild medial right basilar airspace opacity may reflect atelectasis or pneumonia. There is no evidence of pleural effusion or pneumothorax.  The heart is enlarged. An AICD is seen at the left chest wall, with a single lead ending at the right ventricle. No acute osseous abnormalities are seen. Cervical spinal fusion hardware is noted.  IMPRESSION: 1. Small right  pleural effusion noted. Mild medial right basilar airspace opacity may reflect atelectasis or pneumonia. 2. Cardiomegaly noted.   Electronically Signed   By: Roanna Raider M.D.   On: 01/17/2014 22:19   US Abdomen Limited Ruq  01/18/2014   CLINICAL DATA:  Elevated LFTs; epigastric abdominal pain.  EXAM: US ABDOMEN LIMITED - RIGHT UPPER QUADRANT  COMPARISON:  None.  FINDINGS: Gallbladder  No gallstones or wall thickening visualized. The gallbladder wall is borderline normal in thickness. No sonographic Murphy sign noted.  Common bile duct  Diameter: 0.4 cm, within normal limits in caliber  Liver:  No focal lesion identified. Within normal limits in parenchymal echogenicity.  Trace ascites is noted superior to the left hepatic lobe and inferior to the right hepatic lobe.  IMPRESSION: 1. Gallbladder unremarkable in appearance. 2. Trace ascites incidentally noted about the liver, of uncertain significance.   Electronically Signed   By: Roanna Raider M.D.   On: 01/18/2014 00:49    Chart has been reviewed  Assessment/Plan  5 this 9-year-old female with history of heart failure with EF of 20% status post ACD, and history of anorexia and poor by mouth intake with nausea and vomiting  Present on Admission:  . Dehydration - likely secondary to poor by mouth intake. We'll hold Lasix for now check orthostatics in the positive would  need IV fluid rehydration  . Hypocalcemia - will replace calcium check TSH  . Chronic systolic congestive heart failure, NYHA class 4-EF 15% per ECHO 2011 - hold Lasix as patient has had poor by mouth intake and does not appear to be fluid overloaded this  . Diabetes mellitus type 2, controlled - sliding scale hold Januvia given hypoglycemia  . Hypertension - continue home and she  . Hypoglycemia - likely secondary to poor by mouth intake. We'll start hypoglycemia protocol  . Failure to thrive - poor by mouth intake nausea and vomiting, given anorexia nausea and vomiting and  abdominal pain we'll obtain CT of abdomen to evaluate for masses or obstruction. If that is within normal limits would also consider gastric emptying study. We'll obtain sedimentation rate and ANA Elevated LFTs - will stop Lipitor, obtain hepatitis panel, obtain further imaging of the liver, may benefit from GI consult Prophylaxis: Lovenox, Protonix  CODE STATUS: FULL OCDE  Other plan as per orders.  I have spent a total of 55 min on this admission  Elgin Carn 01/18/2014, 2:53 AM

## 2014-01-18 NOTE — Consult Note (Signed)
[facility] Consult Note  Referring Provider: No ref. provider found Primary Care Physician:  Vena Austria, MD Primary Gastroenterologist:  Dr.  Laurel Dimmer Complaint: Nausea vomiting and elevated liver function tests HPI: Andrea [name] is an 63 y.o. black female  who presents with nausea vomiting some vague epigastric abdominal pain decreased by mouth intake and loose stools of approximately 1 months duration. She was seen by Dr. Oletta Lamas at our practice a few days ago and had an abdominal ultrasound which showed only trace ascites and no other significant abnormalities. She had a negative gastric emptying scan in 2013 by chart review. She has a history of gastroesophageal reflux. She denies any dysphagia. She was found to have elevated liver function tests on admission with transaminases in the 300s. She is also found to be hypoglycemic and with the low albumin.  Past Medical History  Diagnosis Date  . Chronic systolic heart failure   . Hypertension   . Stroke 02/2010    left brain CVA? recurrent TIA's treated with TPA  . Dyslipidemia   . Premature ventricular contractions (PVCs) (VPCs)   . GERD (gastroesophageal reflux disease)   . Coronary artery disease     a. DES-dRCA 11/2010. b. DES-pRCA 04/2011 (in an area free of disease on prior cath). c. s/p DES-mRCA 03/2013 for NSTEMI.  . Ischemic cardiomyopathy     a. Hx of medtronic ICD. b. EF 15-20% by cath 03/2013.  . Pulmonary hypertension   . Gout   . Obesity   . Thrombocytopenia   . Depression   . Hypotension     a. Admission 09/2012 for hypotn & ARF. b. Meds held 03/2013 due to hypotension.  . Acute renal insufficiency     a. 09/2012. b. Also noted post-cath 03/2013.  . NSTEMI (non-ST elevated myocardial infarction) 03/2013  . Anginal pain   . Asthma   . Pneumonia ~ 2001    "double" (05/07/2013)  . Shortness of breath     "can happen at anytime" (05/07/2013)  . Type II diabetes mellitus   . Microcytic anemia      a. Noted 03/2013 on labs, iron studies WNL.  Marland Kitchen Migraine headache     "no pain; aura/visual problems only; at least 2-3X/month" (05/07/2013)  . Headaches, cluster     "monthly" (05/07/2013)  . Arthritis     "knees" (05/07/2013)  . DJD (degenerative joint disease)   . Chronic lower back pain   . ICD (implantable cardiac defibrillator) in place   . Anxiety   . CHF (congestive heart failure)   . CKD (chronic kidney disease) stage 3, GFR 30-59 ml/min   . DM (diabetes mellitus)   . CVA (cerebral infarction)   . HTN (hypertension)   . Primary pulmonary HTN   . Automatic implantable cardiac defibrillator - Medtronic 08/24/2013    Past Surgical History  Procedure Laterality Date  . Mass excision      left hip  . Cervical polypectomy  1970's  . US echocardiography  2011  . Tonsillectomy  1960's?  . Right oophorectomy  1980's  . Dilation and curettage of uterus  1970's  . Ostectomy metatarsal Left 1993    "5th toe; from rickets" (05/07/2013)  . Coronary angioplasty with stent placement  2000's    "2 stents" (05/07/2013)  . Coronary angioplasty with stent placement  03/2013    PCI to RCA/notes 05/05/2013; "makes total of 3" (05/07/2013)  . Implantable cardioverter defibrillator implant      Medications Prior to Admission  Medication Sig Dispense Refill  . Acetaminophen (TYLENOL ARTHRITIS PAIN PO) Take 2 tablets by mouth as needed (pain).      Marland Kitchen atorvastatin (LIPITOR) 40 MG tablet Take 40 mg by mouth daily.      Marland Kitchen buPROPion (WELLBUTRIN XL) 150 MG 24 hr tablet Take 2 tablets (300 mg total) by mouth daily.  30 tablet  9  . clopidogrel (PLAVIX) 75 MG tablet Take 1 tablet (75 mg total) by mouth daily with breakfast.  30 tablet  11  . DULoxetine (CYMBALTA) 60 MG capsule Take 60 mg by mouth daily.        . furosemide (LASIX) 20 MG tablet Take 2 tablets (40 mg total) by mouth daily.  30 tablet  11  . JANUVIA 100 MG tablet Take 100 mg by mouth Daily.       Marland Kitchen loratadine (CLARITIN) 10 MG tablet Take 10 mg  by mouth as needed for allergies.      . nitroGLYCERIN (NITROSTAT) 0.4 MG SL tablet Place 0.4 mg under the tongue every 5 (five) minutes as needed for chest pain.      . pantoprazole (PROTONIX) 40 MG tablet Take 40 mg by mouth daily.      . potassium chloride SA (K-DUR,KLOR-CON) 20 MEQ tablet Take 1 tablet (20 mEq total) by mouth daily.  30 tablet  6    Allergies:  Allergies  Allergen Reactions  . Asa [Aspirin] Shortness Of Breath  . Aspirin Shortness Of Breath and Other (See Comments)    Asthma symptoms  . Brilinta [Ticagrelor] Shortness Of Breath and Other (See Comments)    Gout   . Percocet [Oxycodone-Acetaminophen] Nausea And Vomiting  . Darvon Nausea And Vomiting  . Diovan [Valsartan] Other (See Comments)    unknown  . Imitrex [Sumatriptan Base] Other (See Comments)    Unknown reaction  . Nsaids Nausea And Vomiting  . Nsaids Other (See Comments)    GI upset     Family History  Problem Relation Age of Onset  . Heart failure Mother   . Heart attack Father   . Heart disease Brother     Social History:  reports that she quit smoking about 31 years ago. Her smoking use included Cigarettes. She has a .25 pack-year smoking history. She does not have any smokeless tobacco history on file. She reports that she does not drink alcohol or use illicit drugs.  Review of Systems: negative except as above   Blood pressure 132/90, pulse 100, temperature 97.2 F (36.2 C), temperature source Oral, resp. rate 22, height 5' (1.524 m), weight 87.6 kg (193 lb 2 oz), SpO2 100.00%. Head: Normocephalic, without obvious abnormality, atraumatic Neck: no adenopathy, no carotid bruit, no JVD, supple, symmetrical, trachea midline and thyroid not enlarged, symmetric, no tenderness/mass/nodules Resp: clear to auscultation bilaterally Cardio: regular rate and rhythm, S1, S2 normal, no murmur, click, rub or gallop GI: Abdomen soft obese no masses no definite tenderness. Extremities: extremities  normal, atraumatic, no cyanosis or edema  Results for orders placed during the hospital encounter of 01/17/14 (from the past 48 hour(s))  GLUCOSE, CAPILLARY     Status: Abnormal   Collection Time    01/17/14  4:12 PM      Result Value Range   Glucose-Capillary 110 (*) 70 - 99 mg/dL  CBC WITH DIFFERENTIAL     Status: Abnormal   Collection Time    01/17/14  8:26 PM      Result Value Range   WBC 5.8  4.0 - 10.5 K/uL   RBC 5.46 (*) 3.87 - 5.11 MIL/uL   Hemoglobin 12.8  12.0 - 15.0 g/dL   HCT 37.1  36.0 - 46.0 %   MCV 67.9 (*) 78.0 - 100.0 fL   MCH 23.4 (*) 26.0 - 34.0 pg   MCHC 34.5  30.0 - 36.0 g/dL   RDW 18.3 (*) 11.5 - 15.5 %   Platelets 273  150 - 400 K/uL   Neutrophils Relative % 65  43 - 77 %   Neutro Abs 3.7  1.7 - 7.7 K/uL   Lymphocytes Relative 25  12 - 46 %   Lymphs Abs 1.4  0.7 - 4.0 K/uL   Monocytes Relative 10  3 - 12 %   Monocytes Absolute 0.6  0.1 - 1.0 K/uL   Eosinophils Relative 0  0 - 5 %   Eosinophils Absolute 0.0  0.0 - 0.7 K/uL   Basophils Relative 1  0 - 1 %   Basophils Absolute 0.0  0.0 - 0.1 K/uL  COMPREHENSIVE METABOLIC PANEL     Status: Abnormal   Collection Time    01/17/14  8:26 PM      Result Value Range   Sodium 138  137 - 147 mEq/L   Comment: DISREGARD ALL RESULTS     SPECIMEN HEMOLYZED. HEMOLYSIS MAY AFFECT INTEGRITY OF RESULTS.     TEST WILL BE CREDITED     SPECIMEN WILL BE REORDERED   Potassium NOT DONE  3.7 - 5.3 mEq/L   Comment: SPECIMEN HEMOLYZED. HEMOLYSIS MAY AFFECT INTEGRITY OF RESULTS.   Chloride 101  96 - 112 mEq/L   Comment: DISREGARD ALL RESULTS     SPECIMEN HEMOLYZED. HEMOLYSIS MAY AFFECT INTEGRITY OF RESULTS.     TEST WILL BE CREDITED     SPECIMEN WILL BE REORDERED   CO2 19  19 - 32 mEq/L   Comment: DISREGARD ALL RESULTS     SPECIMEN HEMOLYZED. HEMOLYSIS MAY AFFECT INTEGRITY OF RESULTS.     TEST WILL BE CREDITED     SPECIMEN WILL BE REORDERED   Glucose, Bld 93  70 - 99 mg/dL   Comment: DISREGARD ALL RESULTS     SPECIMEN  HEMOLYZED. HEMOLYSIS MAY AFFECT INTEGRITY OF RESULTS.     TEST WILL BE CREDITED     SPECIMEN WILL BE REORDERED   BUN 39 (*) 6 - 23 mg/dL   Comment: DISREGARD ALL RESULTS     SPECIMEN HEMOLYZED. HEMOLYSIS MAY AFFECT INTEGRITY OF RESULTS.     TEST WILL BE CREDITED     SPECIMEN WILL BE REORDERED   Creatinine, Ser 1.30 (*) 0.50 - 1.10 mg/dL   Comment: DISREGARD ALL RESULTS     SPECIMEN HEMOLYZED. HEMOLYSIS MAY AFFECT INTEGRITY OF RESULTS.     TEST WILL BE CREDITED     SPECIMEN WILL BE REORDERED   Calcium 8.8  8.4 - 10.5 mg/dL   Comment: DISREGARD ALL RESULTS     SPECIMEN HEMOLYZED. HEMOLYSIS MAY AFFECT INTEGRITY OF RESULTS.     TEST WILL BE CREDITED     SPECIMEN WILL BE REORDERED   Total Protein 7.1  6.0 - 8.3 g/dL   Comment: DISREGARD ALL RESULTS     SPECIMEN HEMOLYZED. HEMOLYSIS MAY AFFECT INTEGRITY OF RESULTS.     TEST WILL BE CREDITED     SPECIMEN WILL BE REORDERED   Albumin 3.7  3.5 - 5.2 g/dL   Comment: DISREGARD ALL RESULTS     SPECIMEN HEMOLYZED. HEMOLYSIS MAY AFFECT INTEGRITY  OF RESULTS.     TEST WILL BE CREDITED     SPECIMEN WILL BE REORDERED   AST NOT DONE  0 - 37 U/L   Comment: SPECIMEN HEMOLYZED. HEMOLYSIS MAY AFFECT INTEGRITY OF RESULTS.   ALT NOT DONE  0 - 35 U/L   Comment: SPECIMEN HEMOLYZED. HEMOLYSIS MAY AFFECT INTEGRITY OF RESULTS.   Alkaline Phosphatase NOT DONE  39 - 117 U/L   Comment: SPECIMEN HEMOLYZED. HEMOLYSIS MAY AFFECT INTEGRITY OF RESULTS.   Total Bilirubin 2.6 (*) 0.3 - 1.2 mg/dL   Comment: DISREGARD ALL RESULTS     SPECIMEN HEMOLYZED. HEMOLYSIS MAY AFFECT INTEGRITY OF RESULTS.     TEST WILL BE CREDITED     SPECIMEN WILL BE REORDERED   GFR calc non Af Amer 43 (*) >90 mL/min   Comment: DISREGARD ALL RESULTS     SPECIMEN HEMOLYZED. HEMOLYSIS MAY AFFECT INTEGRITY OF RESULTS.     TEST WILL BE CREDITED     SPECIMEN WILL BE REORDERED   GFR calc Af Amer 50 (*) >90 mL/min   Comment: DISREGARD ALL RESULTS     SPECIMEN HEMOLYZED. HEMOLYSIS MAY AFFECT  INTEGRITY OF RESULTS.     TEST WILL BE CREDITED     SPECIMEN WILL BE REORDERED     (NOTE)     The eGFR has been calculated using the CKD EPI equation.     This calculation has not been validated in all clinical situations.     eGFR's persistently <90 mL/min signify possible Chronic Kidney     Disease.  LIPASE, BLOOD     Status: None   Collection Time    01/17/14  8:26 PM      Result Value Range   Lipase 36  11 - 59 U/L  COMPREHENSIVE METABOLIC PANEL     Status: Abnormal   Collection Time    01/17/14 10:12 PM      Result Value Range   Sodium 143  137 - 147 mEq/L   Potassium 4.4  3.7 - 5.3 mEq/L   Comment: HEMOLYSIS AT THIS LEVEL MAY AFFECT RESULT   Chloride 116 (*) 96 - 112 mEq/L   Comment: DELTA CHECK NOTED   CO2 14 (*) 19 - 32 mEq/L   Glucose, Bld 71  70 - 99 mg/dL   BUN 30 (*) 6 - 23 mg/dL   Creatinine, Ser 0.87  0.50 - 1.10 mg/dL   Calcium 5.7 (*) 8.4 - 10.5 mg/dL   Comment: CRITICAL RESULT CALLED TO, READ BACK BY AND VERIFIED WITH:     BOLICK,M RN 68/11/7516 2300 [address]     REPEATED TO VERIFY   Total Protein 4.3 (*) 6.0 - 8.3 g/dL   Albumin 2.2 (*) 3.5 - 5.2 g/dL   AST 133 (*) 0 - 37 U/L   Comment: HEMOLYSIS AT THIS LEVEL MAY AFFECT RESULT   ALT 150 (*) 0 - 35 U/L   Comment: HEMOLYSIS AT THIS LEVEL MAY AFFECT RESULT   Alkaline Phosphatase 111  39 - 117 U/L   Total Bilirubin 1.6 (*) 0.3 - 1.2 mg/dL   GFR calc non Af Amer 70 (*) >90 mL/min   GFR calc Af Amer 81 (*) >90 mL/min   Comment: (NOTE)     The eGFR has been calculated using the CKD EPI equation.     This calculation has not been validated in all clinical situations.     eGFR's persistently <90 mL/min signify possible Chronic Kidney     Disease.  GLUCOSE, CAPILLARY  Status: None   Collection Time    01/18/14  2:48 AM      Result Value Range   Glucose-Capillary 76  70 - 99 mg/dL  GLUCOSE, CAPILLARY     Status: None   Collection Time    01/18/14  3:34 AM      Result Value Range   Glucose-Capillary 92   70 - 99 mg/dL  MAGNESIUM     Status: None   Collection Time    01/18/14  3:40 AM      Result Value Range   Magnesium 2.0  1.5 - 2.5 mg/dL  PHOSPHORUS     Status: Abnormal   Collection Time    01/18/14  3:40 AM      Result Value Range   Phosphorus 5.3 (*) 2.3 - 4.6 mg/dL  TSH     Status: None   Collection Time    01/18/14  3:40 AM      Result Value Range   TSH 0.969  0.350 - 4.500 uIU/mL   Comment: Performed at [place]     Status: None   Collection Time    01/18/14  3:40 AM      Result Value Range   Sed Rate 2  0 - 22 mm/hr  ANA     Status: None   Collection Time    01/18/14  3:40 AM      Result Value Range   ANA NEGATIVE  NEGATIVE   Comment: Performed at [place], ACUTE     Status: None   Collection Time    01/18/14  3:40 AM      Result Value Range   Hepatitis B Surface Ag NEGATIVE  NEGATIVE   HCV Ab NEGATIVE  NEGATIVE   Hep A IgM NON REACTIVE  NON REACTIVE   Hep B C IgM NON REACTIVE  NON REACTIVE   Comment: (NOTE)     High levels of Hepatitis B Core IgM antibody are detectable     during the acute stage of Hepatitis B. This antibody is used     to differentiate current from past HBV infection.     Performed at [place], CAPILLARY     Status: Abnormal   Collection Time    01/18/14  5:55 AM      Result Value Range   Glucose-Capillary 156 (*) 70 - 99 mg/dL   Comment 1 Notify RN    COMPREHENSIVE METABOLIC PANEL     Status: Abnormal   Collection Time    01/18/14  6:38 AM      Result Value Range   Sodium 138  137 - 147 mEq/L   Potassium 6.0 (*) 3.7 - 5.3 mEq/L   Comment: NO VISIBLE HEMOLYSIS     CRITICAL RESULT CALLED TO, READ BACK BY AND VERIFIED WITH:     A.MAGBITANG,RN 01/18/14 0821 BY BSLADE   Chloride 101  96 - 112 mEq/L   CO2 17 (*) 19 - 32 mEq/L   Glucose, Bld 154 (*) 70 - 99 mg/dL   BUN 46 (*) 6 - 23 mg/dL   Creatinine, Ser 1.56 (*) 0.50 - 1.10 mg/dL   Comment: DELTA CHECK  NOTED   Calcium 9.2  8.4 - 10.5 mg/dL   Total Protein 6.8  6.0 - 8.3 g/dL   Albumin 3.6  3.5 - 5.2 g/dL   AST 383 (*) 0 - 37 U/L   ALT 360 (*) 0 -  35 U/L   Alkaline Phosphatase 184 (*) 39 - 117 U/L   Total Bilirubin 2.8 (*) 0.3 - 1.2 mg/dL   GFR calc non Af Amer 35 (*) >90 mL/min   GFR calc Af Amer 40 (*) >90 mL/min   Comment: (NOTE)     The eGFR has been calculated using the CKD EPI equation.     This calculation has not been validated in all clinical situations.     eGFR's persistently <90 mL/min signify possible Chronic Kidney     Disease.  CBC     Status: Abnormal   Collection Time    01/18/14  6:38 AM      Result Value Range   WBC 6.7  4.0 - 10.5 K/uL   Comment: WHITE COUNT CONFIRMED ON SMEAR   RBC 5.43 (*) 3.87 - 5.11 MIL/uL   Hemoglobin 12.6  12.0 - 15.0 g/dL   HCT 36.9  36.0 - 46.0 %   MCV 68.0 (*) 78.0 - 100.0 fL   MCH 23.2 (*) 26.0 - 34.0 pg   MCHC 34.1  30.0 - 36.0 g/dL   RDW 18.4 (*) 11.5 - 15.5 %   Platelets 206  150 - 400 K/uL   Comment: DELTA CHECK NOTED     REPEATED TO VERIFY  GLUCOSE, CAPILLARY     Status: Abnormal   Collection Time    01/18/14  8:04 AM      Result Value Range   Glucose-Capillary 120 (*) 70 - 99 mg/dL  GLUCOSE, CAPILLARY     Status: None   Collection Time    01/18/14 11:18 AM      Result Value Range   Glucose-Capillary 83  70 - 99 mg/dL   Dg Chest 2 View  01/17/2014   CLINICAL DATA:  Diffuse chest pain and shortness of breath on exertion. Nausea and vomiting. History of smoking and asthma.  EXAM: CHEST  2 VIEW  COMPARISON:  DG CHEST 2 VIEW dated 05/05/2013; CT ANGIO CHEST W/CM &/OR WO/CM dated 07/13/2010  FINDINGS: The lungs are well-aerated. A small right pleural effusion is noted. Mild medial right basilar airspace opacity may reflect atelectasis or pneumonia. There is no evidence of pleural effusion or pneumothorax.  The heart is enlarged. An AICD is seen at the left chest wall, with a single lead ending at the right ventricle. No acute  osseous abnormalities are seen. Cervical spinal fusion hardware is noted.  IMPRESSION: 1. Small right pleural effusion noted. Mild medial right basilar airspace opacity may reflect atelectasis or pneumonia. 2. Cardiomegaly noted.   Electronically Signed   By: Garald Balding M.D.   On: 01/17/2014 22:19   US Abdomen Limited Ruq  01/18/2014   CLINICAL DATA:  Elevated LFTs; epigastric abdominal pain.  EXAM: US ABDOMEN LIMITED - RIGHT UPPER QUADRANT  COMPARISON:  None.  FINDINGS: Gallbladder  No gallstones or wall thickening visualized. The gallbladder wall is borderline normal in thickness. No sonographic Murphy sign noted.  Common bile duct  Diameter: 0.4 cm, within normal limits in caliber  Liver:  No focal lesion identified. Within normal limits in parenchymal echogenicity.  Trace ascites is noted superior to the left hepatic lobe and inferior to the right hepatic lobe.  IMPRESSION: 1. Gallbladder unremarkable in appearance. 2. Trace ascites incidentally noted about the liver, of uncertain significance.   Electronically Signed   By: Garald Balding M.D.   On: 01/18/2014 00:49    Assessment: Nausea vomiting and elevated liver function test with negative abdominal  ultrasound, differential diagnosis brought at this point. Plan:  Agree with abdominal pelvic CT scan and hepatitis panel and will await the results of those. If they are unrevealing would probably proceed with EGD and further serologic workup of her elevated liver function tests. We'll follow with you.   PPUGG,PCWT C 01/18/2014, 12:54 PM

## 2014-01-18 NOTE — Progress Notes (Signed)
1640 Made Dr. Rito Ehrlich aware oh bladder scan result  =250 mls pt no urge to void , bladder not distended, no tenderness on palpation , ct result , more frequent pvc's , asymptomatic , and frequent bowl movement r/t Kay exalate Intake due inc potassium

## 2014-01-18 NOTE — ED Provider Notes (Signed)
Medical screening examination/treatment/procedure(s) were conducted as a shared visit with non-physician practitioner(s) and myself.  I personally evaluated the patient during the encounter.  63yo F, c/o N/V, loose stools, abd pain (esp mid-epigastric area) and poor PO intake x3-4 weeks. Seen by GI without definitive dx. Afebrile, tachycardic, BP stable. A&O, mm dry, CTA, +TTP mid-epigastric area, neuro non-focal. LFT's elevated, US unremarkable. Admit.   EKG Interpretation    Date/Time:  Sunday January 17 2014 20:52:08 EST Ventricular Rate:  101 PR Interval:  158 QRS Duration: 110 QT Interval:  377 QTC Calculation: 489 R Axis:   8 Text Interpretation:  Sinus tachycardia Probable left atrial enlargement Nonspecific T abnormalities, lateral leads Borderline prolonged QT interval Baseline wander in lead(s) I III aVL V1 When compared with ECG of 05/06/2013 No significant change was found Confirmed by [facility]  MD, Samyuktha Brau [phone]) on 01/17/2014 9:14:25 PM              Laray Anger, DO 01/18/14 1022

## 2014-01-18 NOTE — Progress Notes (Signed)
[facility] PROGRESS NOTE  Andrea HeadingsCorinne G Niziolek WGN:[id number] DOB: 1951/06/15 DOA: 01/17/2014  PCP: Lolita PatellaEADE,ROBERT ALEXANDER, MD  Brief HPI: Andrea [name] is a 63 y.o. female with numerous medical problems presented with feeling unwell since Christmas. Reported 4 weeks of nausea, vomiting and decreased PO intake, Loose stools but not frequent. She went to see GI 4 day prior to admission and had an US done that she states was WNL. She was supposed to follow up with cardiology next week but states she was tired of feeling bad and came to ER. In ER she was noted to be tachycardiac. Hypoglycemia down to 76. Ca of 5.7 and albumin of 2.2. Of note she had a gastric emptying study done in 2013 which was within normal limits.   Past medical history:  Past Medical History  Diagnosis Date  . Chronic systolic heart failure   . Hypertension   . Stroke 02/2010    left brain CVA? recurrent TIA's treated with TPA  . Dyslipidemia   . Premature ventricular contractions (PVCs) (VPCs)   . GERD (gastroesophageal reflux disease)   . Coronary artery disease     a. DES-dRCA 11/2010. b. DES-pRCA 04/2011 (in an area free of disease on prior cath). c. s/p DES-mRCA 03/2013 for NSTEMI.  . Ischemic cardiomyopathy     a. Hx of medtronic ICD. b. EF 15-20% by cath 03/2013.  . Pulmonary hypertension   . Gout   . Obesity   . Thrombocytopenia   . Depression   . Hypotension     a. Admission 09/2012 for hypotn & ARF. b. Meds held 03/2013 due to hypotension.  . Acute renal insufficiency     a. 09/2012. b. Also noted post-cath 03/2013.  . NSTEMI (non-ST elevated myocardial infarction) 03/2013  . Anginal pain   . Asthma   . Pneumonia ~ 2001    "double" (05/07/2013)  . Shortness of breath     "can happen at anytime" (05/07/2013)  . Type II diabetes mellitus   . Microcytic anemia     a. Noted 03/2013 on labs, iron studies WNL.  Marland Kitchen. Migraine headache     "no pain; aura/visual problems only; at least 2-3X/month"  (05/07/2013)  . Headaches, cluster     "monthly" (05/07/2013)  . Arthritis     "knees" (05/07/2013)  . DJD (degenerative joint disease)   . Chronic lower back pain   . ICD (implantable cardiac defibrillator) in place   . Anxiety   . CHF (congestive heart failure)   . CKD (chronic kidney disease) stage 3, GFR 30-59 ml/min   . DM (diabetes mellitus)   . CVA (cerebral infarction)   . HTN (hypertension)   . Primary pulmonary HTN   . Automatic implantable cardiac defibrillator - Medtronic 08/24/2013   Consultants: None  Procedures: None  Antibiotics: None  Subjective: Patient continues to have nausea but hasn't vomited since last night. Denies chest pain or SOB. Hasn't urinated or had a BM in 4 days.   Objective: Vital Signs  Filed Vitals:   01/17/14 2011 01/17/14 2345 01/18/14 0512 01/18/14 1042  BP:   136/88 132/90  Pulse:  102 104 100  Temp:   97.2 F (36.2 C)   TempSrc:   Oral   Resp:  25 22   Height:   5' (1.524 m)   Weight:   87.6 kg (193 lb 2 oz)   SpO2: 100% 98% 99% 100%    Intake/Output Summary (Last 24 hours) at 01/18/14 1127 Last  data filed at 01/18/14 0830  Gross per 24 hour  Intake    240 ml  Output      0 ml  Net    240 ml   Filed Weights   01/18/14 0512  Weight: 87.6 kg (193 lb 2 oz)   General appearance: alert, cooperative, appears stated age and no distress Head: Normocephalic, without obvious abnormality, atraumatic Throat: lips, mucosa, and tongue normal; teeth and gums normal Resp: clear to auscultation bilaterally Cardio: regular rate and rhythm, S1, S2 normal, no murmur, click, rub or gallop GI: soft, tender in epig without rebound rigidity or guarding. bowel sounds normal; no masses,  no organomegaly Extremities: extremities normal, atraumatic, no cyanosis or edema Neurologic: No focal deficits.  Lab Results:  Basic Metabolic Panel:  Recent Labs Lab 01/17/14 2026 01/17/14 2212 01/18/14 0340 01/18/14 0638  NA 138 143  --  138  K NOT  DONE 4.4  --  6.0*  CL 101 116*  --  101  CO2 19 14*  --  17*  GLUCOSE 93 71  --  154*  BUN 39* 30*  --  46*  CREATININE 1.30* 0.87  --  1.56*  CALCIUM 8.8 5.7*  --  9.2  MG  --   --  2.0  --   PHOS  --   --  5.3*  --    Liver Function Tests:  Recent Labs Lab 01/17/14 2026 01/17/14 2212 01/18/14 0638  AST NOT DONE 133* 383*  ALT NOT DONE 150* 360*  ALKPHOS NOT DONE 111 184*  BILITOT 2.6* 1.6* 2.8*  PROT 7.1 4.3* 6.8  ALBUMIN 3.7 2.2* 3.6    Recent Labs Lab 01/17/14 2026  LIPASE 36   CBC:  Recent Labs Lab 01/17/14 2026 01/18/14 0638  WBC 5.8 6.7  NEUTROABS 3.7  --   HGB 12.8 12.6  HCT 37.1 36.9  MCV 67.9* 68.0*  PLT 273 206   CBG:  Recent Labs Lab 01/18/14 0248 01/18/14 0334 01/18/14 0555 01/18/14 0804 01/18/14 1118  GLUCAP 76 92 156* 120* 83    No results found for this or any previous visit (from the past 240 hour(s)).    Studies/Results: Dg Chest 2 View  01/17/2014   CLINICAL DATA:  Diffuse chest pain and shortness of breath on exertion. Nausea and vomiting. History of smoking and asthma.  EXAM: CHEST  2 VIEW  COMPARISON:  DG CHEST 2 VIEW dated 05/05/2013; CT ANGIO CHEST W/CM &/OR WO/CM dated 07/13/2010  FINDINGS: The lungs are well-aerated. A small right pleural effusion is noted. Mild medial right basilar airspace opacity may reflect atelectasis or pneumonia. There is no evidence of pleural effusion or pneumothorax.  The heart is enlarged. An AICD is seen at the left chest wall, with a single lead ending at the right ventricle. No acute osseous abnormalities are seen. Cervical spinal fusion hardware is noted.  IMPRESSION: 1. Small right pleural effusion noted. Mild medial right basilar airspace opacity may reflect atelectasis or pneumonia. 2. Cardiomegaly noted.   Electronically Signed   By: Roanna Raider M.D.   On: 01/17/2014 22:19   US Abdomen Limited Ruq  01/18/2014   CLINICAL DATA:  Elevated LFTs; epigastric abdominal pain.  EXAM: US ABDOMEN  LIMITED - RIGHT UPPER QUADRANT  COMPARISON:  None.  FINDINGS: Gallbladder  No gallstones or wall thickening visualized. The gallbladder wall is borderline normal in thickness. No sonographic Murphy sign noted.  Common bile duct  Diameter: 0.4 cm, within normal limits in  caliber  Liver:  No focal lesion identified. Within normal limits in parenchymal echogenicity.  Trace ascites is noted superior to the left hepatic lobe and inferior to the right hepatic lobe.  IMPRESSION: 1. Gallbladder unremarkable in appearance. 2. Trace ascites incidentally noted about the liver, of uncertain significance.   Electronically Signed   By: Roanna Raider M.D.   On: 01/18/2014 00:49    Medications:  Scheduled: . buPROPion  300 mg Oral Daily  . clopidogrel  75 mg Oral Q breakfast  . docusate sodium  100 mg Oral BID  . DULoxetine  60 mg Oral Daily  . enoxaparin (LOVENOX) injection  40 mg Subcutaneous Q24H  . folic acid  1 mg Intravenous Daily  . insulin aspart  0-9 Units Subcutaneous Q4H  . pantoprazole  40 mg Oral Daily  . sodium chloride  3 mL Intravenous Q12H  . sodium chloride  3 mL Intravenous Q12H  . thiamine  100 mg Intravenous Daily   Continuous:  WJX:BJYNWG chloride, dextrose, dextrose, dextrose, glucose-Vitamin C, loratadine, ondansetron (ZOFRAN) IV, ondansetron, sodium chloride  Assessment/Plan:  Active Problems:   Chronic systolic congestive heart failure, NYHA class 4-EF 15% per ECHO 2011   Hypertension   Diabetes mellitus type 2, controlled   Dehydration   Hypocalcemia   Hypoglycemia   Failure to thrive   Nausea/Vomting/Upper Abdo Pain/Transaminitis Etiology is unclear. Transaminitis is noted. [place] did not reveal any concerning findings. Will proceed with CT Abdomen. Await Hep Panel. Will consult GI. Passive congestion could be responsible but she is not in CHF currently. Lipitor has been held. Lipase was normal.  ARF and Dehydration with Poor UO Clinically she is dehydrated. Will need IVF  which will be given cautiously. Bladder scan.  Hyperkalemia HCO3, Kayexalate given. EKG pending. Will repeat Bmet.  Hypocalcemia Seems normal now.   Chronic systolic congestive heart failure, NYHA class 4-EF 15% per ECHO 2011 Holding Lasix as patient is very dehydrated. Watch volume status closely. Not on ACEI or ARB due to lack of tolerance in past with hypotension and ARF. Could consider Bidil but will defer to cards as OP.  History of CAD with stent in RCA 2011 Continue Plavix.  Diabetes mellitus type 2 with Hypoglycemia Sliding scale. Hold Januvia given hypoglycemia. A1c is pending.  Hypertension Stable. Continue home meds.   Code Status: Full code  DVT Prophylaxis: Enoxaparin    Family Communication: Discussed with patient and daughter  Disposition Plan: Home when better    LOS: 1 day   [facility]  [facility] Pager [phone] 01/18/2014, 11:27 AM  If 8PM-8AM, please contact night-coverage at [email or web address], password [facility]

## 2014-01-18 NOTE — Progress Notes (Signed)
CRITICAL VALUE ALERT  Critical value received: POtassium =6.0   Date of notification: 01/18/2014  Time of notification: 0900  Critical value read back:yes  Nurse who received alert:   Clarene Reamer Gillie Fleites,rn  MD notified (1st page):  Barnie Del  Time of first page: 0900 MD notified (2nd page):  Time of second page:  Responding MD:dr. Rito Ehrlich  Time MD responded:  [phone]

## 2014-01-18 NOTE — Progress Notes (Signed)
INITIAL NUTRITION ASSESSMENT  DOCUMENTATION CODES Per approved criteria  -Obesity Unspecified   INTERVENTION: Diet advancement per MD discretion Provide Resource Breeze BID when diet advanced Provide Multivitamin with minerals when diet advanced RD to continue to monitor  NUTRITION DIAGNOSIS: Inadequate oral intake related to nausea and vomiting as evidenced by pt's report and current NPO status.   Goal: Pt to meet >/= 90% of their estimated nutrition needs   Monitor:  Diet advancement PO intake Weight Labs  Reason for Assessment: Malnutrition Screening Tool, score of 4  63 y.o. female  Admitting Dx: <principal problem not specified>  ASSESSMENT: 63 y.o. female with numerous medical problems presented with feeling unwell since Christmas. Reported 4 weeks of nausea, vomiting and decreased PO intake, Loose stools but not frequent. In ER she was noted to be tachycardiac. Hypoglycemia down to 76. Ca of 5.7 and albumin of 2.2.   Pt reports nausea and vomiting daily for the past 4 weeks. Pt reports that for 3-4 days PTA she was only able to tolerate a few spoonfuls of food and before then for the past 4 weeks she has only been able to tolerate yogurt and some additional solid food. She states her usual body weight is 180 lbs and she typically eats 3 meals daily. Weight history does not show andy significant recent weight loss.  Nutrition Focused Physical Exam:  Subcutaneous Fat:  Orbital Region: wnl Upper Arm Region: wnl Thoracic and Lumbar Region: wnl  Muscle:  Temple Region: wnl Clavicle Bone Region: wnl Clavicle and Acromion Bone Region: wnl Scapular Bone Region: NA Dorsal Hand: wnl Patellar Region: wnl Anterior Thigh Region: wnl Posterior Calf Region: wnl  Edema: none  Height: Ht Readings from Last 1 Encounters:  01/18/14 5' (1.524 m)    Weight: Wt Readings from Last 1 Encounters:  01/18/14 193 lb 2 oz (87.6 kg)    Ideal Body Weight: 100 lbs  % Ideal  Body Weight: 193%  Wt Readings from Last 10 Encounters:  01/18/14 193 lb 2 oz (87.6 kg)  11/19/13 195 lb (88.451 kg)  09/24/13 189 lb 12.8 oz (86.093 kg)  09/07/13 191 lb 12.8 oz (87 kg)  08/25/13 188 lb 6.4 oz (85.458 kg)  07/10/13 195 lb 1.9 oz (88.506 kg)  05/08/13 187 lb 3.2 oz (84.913 kg)  05/04/13 193 lb 1.9 oz (87.599 kg)  04/29/13 189 lb 2.5 oz (85.8 kg)  04/29/13 189 lb 2.5 oz (85.8 kg)    Usual Body Weight: 180 lbs  % Usual Body Weight: 106%  BMI:  Body mass index is 37.72 kg/(m^2).  Estimated Nutritional Needs: Kcal: 1630-1770 Protein: 75-85 grams Fluid: 2.5 L/day  Skin: intact  Diet Order:    EDUCATION NEEDS: -No education needs identified at this time   Intake/Output Summary (Last 24 hours) at 01/18/14 1553 Last data filed at 01/18/14 1552  Gross per 24 hour  Intake    240 ml  Output      4 ml  Net    236 ml    Last BM: 1/18   Labs:   Recent Labs Lab 01/17/14 2212 01/18/14 0340 01/18/14 0638 01/18/14 1217  NA 143  --  138 139  K 4.4  --  6.0* 6.0*  CL 116*  --  101 102  CO2 14*  --  17* 22  BUN 30*  --  46* 48*  CREATININE 0.87  --  1.56* 1.66*  CALCIUM 5.7*  --  9.2 9.3  MG  --  2.0  --   --  PHOS  --  5.3*  --   --   GLUCOSE 71  --  154* 88    CBG (last 3)   Recent Labs  01/18/14 0555 01/18/14 0804 01/18/14 1118  GLUCAP 156* 120* 83    Scheduled Meds: . buPROPion  300 mg Oral Daily  . clopidogrel  75 mg Oral Q breakfast  . docusate sodium  100 mg Oral BID  . DULoxetine  60 mg Oral Daily  . folic acid  1 mg Intravenous Daily  . heparin subcutaneous  5,000 Units Subcutaneous Q8H  . insulin aspart  0-9 Units Subcutaneous Q4H  . pantoprazole  40 mg Oral Daily  . sodium chloride  3 mL Intravenous Q12H  . sodium chloride  3 mL Intravenous Q12H  . thiamine  100 mg Intravenous Daily    Continuous Infusions:   Past Medical History  Diagnosis Date  . Chronic systolic heart failure   . Hypertension   . Stroke 02/2010     left brain CVA? recurrent TIA's treated with TPA  . Dyslipidemia   . Premature ventricular contractions (PVCs) (VPCs)   . GERD (gastroesophageal reflux disease)   . Coronary artery disease     a. DES-dRCA 11/2010. b. DES-pRCA 04/2011 (in an area free of disease on prior cath). c. s/p DES-mRCA 03/2013 for NSTEMI.  . Ischemic cardiomyopathy     a. Hx of medtronic ICD. b. EF 15-20% by cath 03/2013.  . Pulmonary hypertension   . Gout   . Obesity   . Thrombocytopenia   . Depression   . Hypotension     a. Admission 09/2012 for hypotn & ARF. b. Meds held 03/2013 due to hypotension.  . Acute renal insufficiency     a. 09/2012. b. Also noted post-cath 03/2013.  . NSTEMI (non-ST elevated myocardial infarction) 03/2013  . Anginal pain   . Asthma   . Pneumonia ~ 2001    "double" (05/07/2013)  . Shortness of breath     "can happen at anytime" (05/07/2013)  . Type II diabetes mellitus   . Microcytic anemia     a. Noted 03/2013 on labs, iron studies WNL.  Marland Kitchen. Migraine headache     "no pain; aura/visual problems only; at least 2-3X/month" (05/07/2013)  . Headaches, cluster     "monthly" (05/07/2013)  . Arthritis     "knees" (05/07/2013)  . DJD (degenerative joint disease)   . Chronic lower back pain   . ICD (implantable cardiac defibrillator) in place   . Anxiety   . CHF (congestive heart failure)   . CKD (chronic kidney disease) stage 3, GFR 30-59 ml/min   . DM (diabetes mellitus)   . CVA (cerebral infarction)   . HTN (hypertension)   . Primary pulmonary HTN   . Automatic implantable cardiac defibrillator - Medtronic 08/24/2013    Past Surgical History  Procedure Laterality Date  . Mass excision      left hip  . Cervical polypectomy  1970's  . [place] echocardiography  2011  . Tonsillectomy  1960's?  . Right oophorectomy  1980's  . Dilation and curettage of uterus  1970's  . Ostectomy metatarsal Left 1993    "5th toe; from rickets" (05/07/2013)  . Coronary angioplasty with stent placement  2000's     "2 stents" (05/07/2013)  . Coronary angioplasty with stent placement  03/2013    PCI to RCA/notes 05/05/2013; "makes total of 3" (05/07/2013)  . Implantable cardioverter defibrillator implant      Ivee Poellnitz  Charissa Bash RD, LDN Inpatient Clinical Dietitian Pager: [phone] After Hours Pager: [phone]

## 2014-01-19 LAB — COMPREHENSIVE METABOLIC PANEL
ALT: 642 U/L — ABNORMAL HIGH (ref 0–35)
AST: 776 U/L — AB (ref 0–37)
Albumin: 3.4 g/dL — ABNORMAL LOW (ref 3.5–5.2)
Alkaline Phosphatase: 192 U/L — ABNORMAL HIGH (ref 39–117)
BILIRUBIN TOTAL: 3.1 mg/dL — AB (ref 0.3–1.2)
BUN: 52 mg/dL — ABNORMAL HIGH (ref 6–23)
CHLORIDE: 101 meq/L (ref 96–112)
CO2: 20 meq/L (ref 19–32)
CREATININE: 1.85 mg/dL — AB (ref 0.50–1.10)
Calcium: 8.8 mg/dL (ref 8.4–10.5)
GFR calc Af Amer: 33 mL/min — ABNORMAL LOW (ref >90)
GFR, EST NON AFRICAN AMERICAN: 28 mL/min — AB (ref >90)
GLUCOSE: 107 mg/dL — AB (ref 70–99)
Potassium: 5.3 mEq/L (ref 3.7–5.3)
Sodium: 139 mEq/L (ref 137–147)
Total Protein: 6.3 g/dL (ref 6.0–8.3)

## 2014-01-19 LAB — CBC
HCT: 37.5 % (ref 36.0–46.0)
HEMOGLOBIN: 12.6 g/dL (ref 12.0–15.0)
MCH: 23 pg — ABNORMAL LOW (ref 26.0–34.0)
MCHC: 33.6 g/dL (ref 30.0–36.0)
MCV: 68.6 fL — ABNORMAL LOW (ref 78.0–100.0)
PLATELETS: 185 10*3/uL (ref 150–400)
RBC: 5.47 MIL/uL — AB (ref 3.87–5.11)
RDW: 18.3 % — ABNORMAL HIGH (ref 11.5–15.5)
WBC: 5.7 10*3/uL (ref 4.0–10.5)

## 2014-01-19 LAB — GLUCOSE, CAPILLARY
GLUCOSE-CAPILLARY: 112 mg/dL — AB (ref 70–99)
Glucose-Capillary: 95 mg/dL (ref 70–99)
Glucose-Capillary: 102 mg/dL — ABNORMAL HIGH (ref 70–99)
Glucose-Capillary: 120 mg/dL — ABNORMAL HIGH (ref 70–99)
Glucose-Capillary: 182 mg/dL — ABNORMAL HIGH (ref 70–99)
Glucose-Capillary: 136 mg/dL — ABNORMAL HIGH (ref 70–99)

## 2014-01-19 LAB — PREALBUMIN: PREALBUMIN: 9 mg/dL — AB (ref 17.0–34.0)

## 2014-01-19 LAB — IRON AND TIBC
IRON: 17 ug/dL — AB (ref 42–135)
SATURATION RATIOS: 5 % — AB (ref 20–55)
TIBC: 319 ug/dL (ref 250–470)
UIBC: 302 ug/dL (ref 125–400)

## 2014-01-19 MED ORDER — VITAMIN B-1 100 MG PO TABS
100.0000 mg | ORAL_TABLET | Freq: Every day | ORAL | Status: DC
Start: 2014-01-19 — End: 2014-01-23
  Administered 2014-01-19 – 2014-01-22 (×4): 100 mg via ORAL
  Filled 2014-01-19 (×4): qty 1

## 2014-01-19 MED ORDER — FOLIC ACID 1 MG PO TABS
1.0000 mg | ORAL_TABLET | Freq: Every day | ORAL | Status: DC
Start: 2014-01-19 — End: 2014-01-23
  Administered 2014-01-19 – 2014-01-22 (×4): 1 mg via ORAL
  Filled 2014-01-19 (×4): qty 1

## 2014-01-19 MED ORDER — SODIUM CHLORIDE 0.9 % IV BOLUS (SEPSIS)
250.0000 mL | Freq: Once | INTRAVENOUS | Status: AC
  Administered 2014-01-19: 250 mL via INTRAVENOUS

## 2014-01-19 MED ORDER — HYDROMORPHONE HCL PF 1 MG/ML IJ SOLN
0.5000 mg | Freq: Four times a day (QID) | INTRAMUSCULAR | Status: DC | PRN
  Administered 2014-01-19: 0.5 mg via INTRAVENOUS
  Filled 2014-01-19: qty 1

## 2014-01-19 NOTE — Progress Notes (Signed)
Received pt lethargic . Oriented to self opens eyes when name called . Relative at bedside stated pt " had been sweating and about to pass out after given medicine". VS taken stable 02 sat at 98% @2lnc . Follows commands,but with minimal verbal response, bilateral hand grasp present, able to move upper and lower extremities. Pupils reactive to light . 45mm

## 2014-01-19 NOTE — Progress Notes (Signed)
[facility] Progress Note  Subjective: Patient feels a little better today no nausea vomiting or pain. Tolerating diet. Having some increased diarrhea after Kayexalate given for high potassium.  Objective: Vital signs in last 24 hours: Temp:  [97.3 F (36.3 [name])-97.9 F (36.6 [name])] 97.9 F (36.6 [name]) (01/20 0450) Pulse Rate:  [90-100] 91 (01/20 0839) Resp:  [18-22] 18 (01/20 0839) BP: (109-132)/(70-90) 109/74 mmHg (01/20 0839) SpO2:  [97 %-100 %] 100 % (01/20 0839) Weight:  [87.136 kg (192 lb 1.6 oz)] 87.136 kg (192 lb 1.6 oz) (01/20 0450) Weight change: -0.464 kg (-1 lb 0.4 oz)   PE: Abdomen soft no organomegaly  Lab Results: Results for orders placed during the hospital encounter of 01/17/14 (from the past 24 hour(s))  GLUCOSE, CAPILLARY     Status: None   Collection Time    01/18/14 11:18 AM      Result Value Range   Glucose-Capillary 83  70 - 99 mg/dL  BASIC METABOLIC PANEL     Status: Abnormal   Collection Time    01/18/14 12:17 PM      Result Value Range   Sodium 139  137 - 147 mEq/L   Potassium 6.0 (*) 3.7 - 5.3 mEq/L   Chloride 102  96 - 112 mEq/L   CO2 22  19 - 32 mEq/L   Glucose, Bld 88  70 - 99 mg/dL   BUN 48 (*) 6 - 23 mg/dL   Creatinine, Ser 1.611.66 (*) 0.50 - 1.10 mg/dL   Calcium 9.3  8.4 - 09.610.5 mg/dL   GFR calc non Af Amer 32 (*) >90 mL/min   GFR calc Af Amer 37 (*) >90 mL/min  GLUCOSE, CAPILLARY     Status: None   Collection Time    01/18/14  4:15 PM      Result Value Range   Glucose-Capillary 85  70 - 99 mg/dL  GLUCOSE, CAPILLARY     Status: Abnormal   Collection Time    01/18/14  8:06 PM      Result Value Range   Glucose-Capillary 112 (*) 70 - 99 mg/dL   Comment 1 Notify RN     Comment 2 Documented in Chart    BASIC METABOLIC PANEL     Status: Abnormal   Collection Time    01/18/14  9:30 PM      Result Value Range   Sodium 141  137 - 147 mEq/L   Potassium 5.3  3.7 - 5.3 mEq/L   Chloride 104  96 - 112 mEq/L   CO2 21  19 - 32 mEq/L    Glucose, Bld 135 (*) 70 - 99 mg/dL   BUN 50 (*) 6 - 23 mg/dL   Creatinine, Ser 0.451.73 (*) 0.50 - 1.10 mg/dL   Calcium 9.1  8.4 - 40.910.5 mg/dL   GFR calc non Af Amer 30 (*) >90 mL/min   GFR calc Af Amer 35 (*) >90 mL/min  GLUCOSE, CAPILLARY     Status: Abnormal   Collection Time    01/19/14 12:23 AM      Result Value Range   Glucose-Capillary 112 (*) 70 - 99 mg/dL   Comment 1 Notify RN     Comment 2 Documented in Chart    CBC     Status: Abnormal   Collection Time    01/19/14  3:50 AM      Result Value Range   WBC 5.7  4.0 - 10.5 K/uL   RBC 5.47 (*) 3.87 -  5.11 MIL/uL   Hemoglobin 12.6  12.0 - 15.0 g/dL   HCT 27.5  17.0 - 01.7 %   MCV 68.6 (*) 78.0 - 100.0 fL   MCH 23.0 (*) 26.0 - 34.0 pg   MCHC 33.6  30.0 - 36.0 g/dL   RDW 49.4 (*) 49.6 - 75.9 %   Platelets 185  150 - 400 K/uL  COMPREHENSIVE METABOLIC PANEL     Status: Abnormal   Collection Time    01/19/14  3:50 AM      Result Value Range   Sodium 139  137 - 147 mEq/L   Potassium 5.3  3.7 - 5.3 mEq/L   Chloride 101  96 - 112 mEq/L   CO2 20  19 - 32 mEq/L   Glucose, Bld 107 (*) 70 - 99 mg/dL   BUN 52 (*) 6 - 23 mg/dL   Creatinine, Ser 1.63 (*) 0.50 - 1.10 mg/dL   Calcium 8.8  8.4 - 84.6 mg/dL   Total Protein 6.3  6.0 - 8.3 g/dL   Albumin 3.4 (*) 3.5 - 5.2 g/dL   AST 659 (*) 0 - 37 U/L   ALT 642 (*) 0 - 35 U/L   Alkaline Phosphatase 192 (*) 39 - 117 U/L   Total Bilirubin 3.1 (*) 0.3 - 1.2 mg/dL   GFR calc non Af Amer 28 (*) >90 mL/min   GFR calc Af Amer 33 (*) >90 mL/min  GLUCOSE, CAPILLARY     Status: None   Collection Time    01/19/14  4:42 AM      Result Value Range   Glucose-Capillary 95  70 - 99 mg/dL   Comment 1 Notify RN     Comment 2 Documented in Chart    GLUCOSE, CAPILLARY     Status: Abnormal   Collection Time    01/19/14  8:40 AM      Result Value Range   Glucose-Capillary 102 (*) 70 - 99 mg/dL    Studies/Results: Ct Abdomen Pelvis Wo Contrast  01/18/2014   CLINICAL DATA:  Nausea vomiting.  EXAM:  CT ABDOMEN AND PELVIS WITHOUT CONTRAST  TECHNIQUE: Multidetector CT imaging of the abdomen and pelvis was performed following the standard protocol without intravenous contrast.  COMPARISON:  Ultrasound, 01/14/2014.  FINDINGS: Small to moderate right and minimal left pleural effusions. There is associated dependent atelectasis, with a possible dependent infiltrate on the right. Heart appears moderately enlarged.  Liver and spleen are unremarkable. Normal gallbladder. No bile duct dilation. Pancreas is unremarkable.  Prominent lymph nodes track along the gastrohepatic ligament, the largest measuring 11 mm in short axis. These are nonspecific and likely reactive.  No adrenal masses. Bilateral renal cortical thinning with lobulated renal contours. No renal masses. A vascular calcification is noted on the left. No collecting system stones. No hydronephrosis. There is perinephric stranding bilaterally that is likely chronic. Normal ureters. The bladder is decompressed.  Uterus is prominent with a mildly lobulated contour at least 1 discrete partly calcified fibroid. Uterus otherwise unremarkable. No adnexal masses.  No other adenopathy.  No ascites.  Colon and small bowel are normal in caliber. There are air-fluid levels, nonspecific. No bowel wall thickening or mesenteric inflammation is seen. A normal appendix is visualized.  There are degenerative changes throughout the visualized spine. No acute fracture is evident, and there are no osteoblastic or osteolytic lesions. There also degenerative changes of both hips and SI joints.  IMPRESSION: 1. There are nonspecific air-fluid levels within the colon and small  bowel without bowel wall thickening or inflammatory changes. This may reflect gastroenteritis. 2. No other evidence of an acute abnormality within the abdomen or pelvis. 3. Small to moderate right and minimal left pleural effusions. There is associated dependent lower lobe opacity the mostly on the right. This  likely atelectasis. Infiltrate is possible. 4. Multiple chronic findings as detailed above.   Electronically Signed   By: Amie Portland M.D.   On: 01/18/2014 15:43   Dg Chest 2 View  01/17/2014   CLINICAL DATA:  Diffuse chest pain and shortness of breath on exertion. Nausea and vomiting. History of smoking and asthma.  EXAM: CHEST  2 VIEW  COMPARISON:  DG CHEST 2 VIEW dated 05/05/2013; CT ANGIO CHEST W/CM &/OR WO/CM dated 07/13/2010  FINDINGS: The lungs are well-aerated. A small right pleural effusion is noted. Mild medial right basilar airspace opacity may reflect atelectasis or pneumonia. There is no evidence of pleural effusion or pneumothorax.  The heart is enlarged. An AICD is seen at the left chest wall, with a single lead ending at the right ventricle. No acute osseous abnormalities are seen. Cervical spinal fusion hardware is noted.  IMPRESSION: 1. Small right pleural effusion noted. Mild medial right basilar airspace opacity may reflect atelectasis or pneumonia. 2. Cardiomegaly noted.   Electronically Signed   By: Roanna Raider M.D.   On: 01/17/2014 22:19   US Abdomen Limited Ruq  01/18/2014   CLINICAL DATA:  Elevated LFTs; epigastric abdominal pain.  EXAM: US ABDOMEN LIMITED - RIGHT UPPER QUADRANT  COMPARISON:  None.  FINDINGS: Gallbladder  No gallstones or wall thickening visualized. The gallbladder wall is borderline normal in thickness. No sonographic Murphy sign noted.  Common bile duct  Diameter: 0.4 cm, within normal limits in caliber  Liver:  No focal lesion identified. Within normal limits in parenchymal echogenicity.  Trace ascites is noted superior to the left hepatic lobe and inferior to the right hepatic lobe.  IMPRESSION: 1. Gallbladder unremarkable in appearance. 2. Trace ascites incidentally noted about the liver, of uncertain significance.   Electronically Signed   By: Roanna Raider M.D.   On: 01/18/2014 00:49      Assessment: 1. Nausea diarrhea and elevated liver function tests.  CT scan and hepatitis profile unrevealing for etiology. Liver gallbladder and spleen appear normal, no ductal dilatation  Plan: Will check CMV serology as well as autoimmune markers as this appears to be an  hepatocellular rather than biliary process    Andrea [name] 01/19/2014, 9:28 AM

## 2014-01-19 NOTE — Progress Notes (Signed)
UR completed Louvenia Golomb K. Africa Masaki, RN, BSN, MSHL, CCM  01/19/2014 3:43 PM

## 2014-01-19 NOTE — ED Provider Notes (Signed)
Medical screening examination/treatment/procedure(s) were conducted as a shared visit with non-physician practitioner(s) and myself.  I personally evaluated the patient during the encounter. Please see my previous note.  EKG Interpretation    Date/Time:  Sunday January 17 2014 20:52:08 EST Ventricular Rate:  101 PR Interval:  158 QRS Duration: 110 QT Interval:  377 QTC Calculation: 489 R Axis:   8 Text Interpretation:  Sinus tachycardia Probable left atrial enlargement Nonspecific T abnormalities, lateral leads Borderline prolonged QT interval Baseline wander in lead(s) I III aVL V1 When compared with ECG of 05/06/2013 No significant change was found Confirmed by [facility]  MD, Shellie Rogoff [phone]) on 01/17/2014 9:14:25 PM              Laray Anger, DO 01/19/14 1401

## 2014-01-19 NOTE — Progress Notes (Signed)
Dr. Rito Ehrlich made aware of bladder scan result nss fluids in progress ,. Iv team notified to start a new iv lline to no avail .dr Rito Ehrlich  Ordered midline cathter insertion

## 2014-01-19 NOTE — Progress Notes (Signed)
[facility] PROGRESS NOTE  CIIN BRAZZEL GNF:[id number] DOB: 07/29/1951 DOA: 01/17/2014  PCP: Lolita Patella, MD  Brief HPI: Andrea [name] is a 63 y.o. female with numerous medical problems presented with feeling unwell since Christmas. Reported 4 weeks of nausea, vomiting and decreased PO intake, Loose stools but not frequent. She went to see GI 4 day prior to admission and had an US done that she states was WNL. She was supposed to follow up with cardiology next week but states she was tired of feeling bad and came to ER. In ER she was noted to be tachycardiac. Hypoglycemia down to 76. Ca of 5.7 and albumin of 2.2. Of note she had a gastric emptying study done in 2013 which was within normal limits.   Past medical history:  Past Medical History  Diagnosis Date  . Chronic systolic heart failure   . Hypertension   . Stroke 02/2010    left brain CVA? recurrent TIA's treated with TPA  . Dyslipidemia   . Premature ventricular contractions (PVCs) (VPCs)   . GERD (gastroesophageal reflux disease)   . Coronary artery disease     a. DES-dRCA 11/2010. b. DES-pRCA 04/2011 (in an area free of disease on prior cath). c. s/p DES-mRCA 03/2013 for NSTEMI.  . Ischemic cardiomyopathy     a. Hx of medtronic ICD. b. EF 15-20% by cath 03/2013.  . Pulmonary hypertension   . Gout   . Obesity   . Thrombocytopenia   . Depression   . Hypotension     a. Admission 09/2012 for hypotn & ARF. b. Meds held 03/2013 due to hypotension.  . Acute renal insufficiency     a. 09/2012. b. Also noted post-cath 03/2013.  . NSTEMI (non-ST elevated myocardial infarction) 03/2013  . Anginal pain   . Asthma   . Microcytic anemia     a. Noted 03/2013 on labs, iron studies WNL.  Marland Kitchen Chronic lower back pain   . Anxiety   . CHF (congestive heart failure)   . CKD (chronic kidney disease) stage 3, GFR 30-59 ml/min   . HTN (hypertension)   . Primary pulmonary HTN   . Automatic implantable cardiac  defibrillator - Medtronic 08/24/2013  . Automatic implantable cardioverter-defibrillator in situ   . Heart murmur   . Pneumonia ~ 2001; 2014    "double; late last year" (01/18/2014)  . Shortness of breath     "can happen at anytime" (01/18/2014)  . Type II diabetes mellitus   . Migraine headache     "no pain; aura/visual problems only; at least 2-3X/month" (01/18/2014)  . Headaches, cluster     "monthly" (01/18/2014)  . Arthritis     "knees" (01/18/2014)  . DJD (degenerative joint disease)   . Bipolar disorder    Consultants: [facility]  Procedures: None  Antibiotics: None  Subjective: Patient feels better. Nausea and abdominal pain has improved. But has had lot of loose stools overnight. States she still hasn't urinated.   Objective: Vital Signs  Filed Vitals:   01/18/14 1042 01/18/14 1317 01/18/14 2014 01/19/14 0450  BP: 132/90 123/86 119/78 117/70  Pulse: 100 100 94 90  Temp:  97.8 F (36.6 C) 97.3 F (36.3 C) 97.9 F (36.6 C)  TempSrc:  Oral Oral Oral  Resp:  22 20 20   Height:      Weight:    87.136 kg (192 lb 1.6 oz)  SpO2: 100% 98% 97% 98%    Intake/Output Summary (Last 24 hours) at  01/19/14 0838 Last data filed at 01/19/14 0616  Gross per 24 hour  Intake 2158.75 ml  Output      6 ml  Net 2152.75 ml   Filed Weights   01/18/14 0512 01/19/14 0450  Weight: 87.6 kg (193 lb 2 oz) 87.136 kg (192 lb 1.6 oz)   General appearance: alert, cooperative, appears stated age and no distress Resp: clear to auscultation bilaterally Cardio: regular rate and rhythm, S1, S2 normal, no murmur, click, rub or gallop GI: soft, non tender today, bowel sounds normal; no masses, no organomegaly Extremities: minimal edema Neurologic: No focal deficits.  Lab Results:  Basic Metabolic Panel:  Recent Labs Lab 01/17/14 2212 01/18/14 0340 01/18/14 0638 01/18/14 1217 01/18/14 2130 01/19/14 0350  NA 143  --  138 139 141 139  K 4.4  --  6.0* 6.0* 5.3 5.3  CL 116*  --  101 102  104 101  CO2 14*  --  17* 22 21 20   GLUCOSE 71  --  154* 88 135* 107*  BUN 30*  --  46* 48* 50* 52*  CREATININE 0.87  --  1.56* 1.66* 1.73* 1.85*  CALCIUM 5.7*  --  9.2 9.3 9.1 8.8  MG  --  2.0  --   --   --   --   PHOS  --  5.3*  --   --   --   --    Liver Function Tests:  Recent Labs Lab 01/17/14 2026 01/17/14 2212 01/18/14 0638 01/19/14 0350  AST NOT DONE 133* 383* 776*  ALT NOT DONE 150* 360* 642*  ALKPHOS NOT DONE 111 184* 192*  BILITOT 2.6* 1.6* 2.8* 3.1*  PROT 7.1 4.3* 6.8 6.3  ALBUMIN 3.7 2.2* 3.6 3.4*    Recent Labs Lab 01/17/14 2026  LIPASE 36   CBC:  Recent Labs Lab 01/17/14 2026 01/18/14 0638 01/19/14 0350  WBC 5.8 6.7 5.7  NEUTROABS 3.7  --   --   HGB 12.8 12.6 12.6  HCT 37.1 36.9 37.5  MCV 67.9* 68.0* 68.6*  PLT 273 206 185   CBG:  Recent Labs Lab 01/18/14 1118 01/18/14 1615 01/18/14 2006 01/19/14 0023 01/19/14 0442  GLUCAP 83 85 112* 112* 95    No results found for this or any previous visit (from the past 240 hour(s)).    Studies/Results: Ct Abdomen Pelvis Wo Contrast  01/18/2014   CLINICAL DATA:  Nausea vomiting.  EXAM: CT ABDOMEN AND PELVIS WITHOUT CONTRAST  TECHNIQUE: Multidetector CT imaging of the abdomen and pelvis was performed following the standard protocol without intravenous contrast.  COMPARISON:  Ultrasound, 01/14/2014.  FINDINGS: Small to moderate right and minimal left pleural effusions. There is associated dependent atelectasis, with a possible dependent infiltrate on the right. Heart appears moderately enlarged.  Liver and spleen are unremarkable. Normal gallbladder. No bile duct dilation. Pancreas is unremarkable.  Prominent lymph nodes track along the gastrohepatic ligament, the largest measuring 11 mm in short axis. These are nonspecific and likely reactive.  No adrenal masses. Bilateral renal cortical thinning with lobulated renal contours. No renal masses. A vascular calcification is noted on the left. No collecting  system stones. No hydronephrosis. There is perinephric stranding bilaterally that is likely chronic. Normal ureters. The bladder is decompressed.  Uterus is prominent with a mildly lobulated contour at least 1 discrete partly calcified fibroid. Uterus otherwise unremarkable. No adnexal masses.  No other adenopathy.  No ascites.  Colon and small bowel are normal in caliber. There are  air-fluid levels, nonspecific. No bowel wall thickening or mesenteric inflammation is seen. A normal appendix is visualized.  There are degenerative changes throughout the visualized spine. No acute fracture is evident, and there are no osteoblastic or osteolytic lesions. There also degenerative changes of both hips and SI joints.  IMPRESSION: 1. There are nonspecific air-fluid levels within the colon and small bowel without bowel wall thickening or inflammatory changes. This may reflect gastroenteritis. 2. No other evidence of an acute abnormality within the abdomen or pelvis. 3. Small to moderate right and minimal left pleural effusions. There is associated dependent lower lobe opacity the mostly on the right. This likely atelectasis. Infiltrate is possible. 4. Multiple chronic findings as detailed above.   Electronically Signed   By: Amie Portland M.D.   On: 01/18/2014 15:43   Dg Chest 2 View  01/17/2014   CLINICAL DATA:  Diffuse chest pain and shortness of breath on exertion. Nausea and vomiting. History of smoking and asthma.  EXAM: CHEST  2 VIEW  COMPARISON:  DG CHEST 2 VIEW dated 05/05/2013; CT ANGIO CHEST W/CM &/OR WO/CM dated 07/13/2010  FINDINGS: The lungs are well-aerated. A small right pleural effusion is noted. Mild medial right basilar airspace opacity may reflect atelectasis or pneumonia. There is no evidence of pleural effusion or pneumothorax.  The heart is enlarged. An AICD is seen at the left chest wall, with a single lead ending at the right ventricle. No acute osseous abnormalities are seen. Cervical spinal fusion  hardware is noted.  IMPRESSION: 1. Small right pleural effusion noted. Mild medial right basilar airspace opacity may reflect atelectasis or pneumonia. 2. Cardiomegaly noted.   Electronically Signed   By: Roanna Raider M.D.   On: 01/17/2014 22:19   US Abdomen Limited Ruq  01/18/2014   CLINICAL DATA:  Elevated LFTs; epigastric abdominal pain.  EXAM: US ABDOMEN LIMITED - RIGHT UPPER QUADRANT  COMPARISON:  None.  FINDINGS: Gallbladder  No gallstones or wall thickening visualized. The gallbladder wall is borderline normal in thickness. No sonographic Murphy sign noted.  Common bile duct  Diameter: 0.4 cm, within normal limits in caliber  Liver:  No focal lesion identified. Within normal limits in parenchymal echogenicity.  Trace ascites is noted superior to the left hepatic lobe and inferior to the right hepatic lobe.  IMPRESSION: 1. Gallbladder unremarkable in appearance. 2. Trace ascites incidentally noted about the liver, of uncertain significance.   Electronically Signed   By: Roanna Raider M.D.   On: 01/18/2014 00:49    Medications:  Scheduled: . bisoprolol  5 mg Oral Daily  . buPROPion  300 mg Oral Daily  . clopidogrel  75 mg Oral Q breakfast  . docusate sodium  100 mg Oral BID  . DULoxetine  60 mg Oral Daily  . folic acid  1 mg Oral Daily  . heparin subcutaneous  5,000 Units Subcutaneous Q8H  . insulin aspart  0-9 Units Subcutaneous Q4H  . pantoprazole  40 mg Oral Daily  . sodium chloride  250 mL Intravenous Once  . sodium chloride  3 mL Intravenous Q12H  . sodium chloride  3 mL Intravenous Q12H  . thiamine  100 mg Oral Daily   Continuous: . sodium chloride 75 mL/hr at 01/18/14 1705   WEX:HBZJIRCV, glucose-Vitamin C, loratadine, ondansetron (ZOFRAN) IV, ondansetron, sodium chloride  Assessment/Plan:  Active Problems:   Chronic systolic congestive heart failure, NYHA class 4-EF 15% per ECHO 2011   Hypertension   Diabetes mellitus type 2, controlled  Dehydration   Hypocalcemia    Hypoglycemia   Failure to thrive   Nausea/Vomiting/Upper Abdo Pain/Transaminitis Etiology is unclear. Nausea and abdominal pain is improved. But LFT's continue to rise. GI is following. CTdid not show any hepatic abnormalities. US did not reveal any concerning findings either. Await Hepatitis Panel. Passive congestion could be responsible but she is not in CHF currently. Lipitor has been held. Lipase was normal.  ARF and Dehydration with Poor UO Creatinine has increased. Clinically she was dehydrated. Will give fluid bolus and recheck renal function in AM. Continue IVF which will be given cautiously. Bladder scan as she hasn't urinated much. Holding her diuretics for now.  Hyperkalemia Improved with Kayexalate. Continue to monitor.  Loose Stools Most likely due to multiple doses of Kayexalate given 1/19. Continue to monitor for now.   Hypocalcemia Seems normal now.   Chronic systolic congestive heart failure, NYHA class 4-EF 15% per ECHO 2011 Holding Lasix as patient was very dehydrated. Monitor volume status closely. Not on ACEI or ARB due to lack of tolerance in past with hypotension and ARF. Could consider Bidil but will defer to cards as OP.  History of CAD with stent in RCA 2011 Continue Plavix.  Diabetes mellitus type 2 with Hypoglycemia Sliding scale. Holding Januvia due to hypoglycemia. CBG's stable. A1c is 6.4.  Hypertension Stable. Continue home meds.   Code Status: Full code  DVT Prophylaxis: Enoxaparin    Family Communication: Discussed with patient  Disposition Plan: Home when better    LOS: 2 days   [facility] LPKRISHNAN,Tal Kempker  [facility] Pager [phone] 01/19/2014, 8:38 AM  If 8PM-8AM, please contact night-coverage at [email or web address], password [email or web address]

## 2014-01-20 ENCOUNTER — Inpatient Hospital Stay (HOSPITAL_COMMUNITY): Payer: BC Managed Care – PPO

## 2014-01-20 DIAGNOSIS — I428 Other cardiomyopathies: Secondary | ICD-10-CM

## 2014-01-20 DIAGNOSIS — R34 Anuria and oliguria: Secondary | ICD-10-CM

## 2014-01-20 LAB — URINE MICROSCOPIC-ADD ON

## 2014-01-20 LAB — URINALYSIS, ROUTINE W REFLEX MICROSCOPIC
GLUCOSE, UA: NEGATIVE mg/dL
Glucose, UA: NEGATIVE mg/dL
Hgb urine dipstick: NEGATIVE
Hgb urine dipstick: NEGATIVE
KETONES UR: NEGATIVE mg/dL
Ketones, ur: NEGATIVE mg/dL
LEUKOCYTES UA: NEGATIVE
Leukocytes, UA: NEGATIVE
NITRITE: NEGATIVE
Nitrite: NEGATIVE
PH: 5.5 (ref 5.0–8.0)
PROTEIN: 100 mg/dL — AB
PROTEIN: 100 mg/dL — AB
Specific Gravity, Urine: 1.022 (ref 1.005–1.030)
Specific Gravity, Urine: 1.022 (ref 1.005–1.030)
UROBILINOGEN UA: 1 mg/dL (ref 0.0–1.0)
Urobilinogen, UA: 1 mg/dL (ref 0.0–1.0)
pH: 5.5 (ref 5.0–8.0)

## 2014-01-20 LAB — SODIUM, URINE, RANDOM: SODIUM UR: 20 meq/L

## 2014-01-20 LAB — CBC
HCT: 40.9 % (ref 36.0–46.0)
Hemoglobin: 14 g/dL (ref 12.0–15.0)
MCH: 23.6 pg — ABNORMAL LOW (ref 26.0–34.0)
MCHC: 34.2 g/dL (ref 30.0–36.0)
MCV: 69.1 fL — ABNORMAL LOW (ref 78.0–100.0)
Platelets: 159 10*3/uL (ref 150–400)
RBC: 5.92 MIL/uL — AB (ref 3.87–5.11)
RDW: 18.6 % — AB (ref 11.5–15.5)
WBC: 7.8 10*3/uL (ref 4.0–10.5)

## 2014-01-20 LAB — BASIC METABOLIC PANEL
BUN: 62 mg/dL — ABNORMAL HIGH (ref 6–23)
CALCIUM: 8.8 mg/dL (ref 8.4–10.5)
CO2: 15 meq/L — AB (ref 19–32)
Chloride: 102 mEq/L (ref 96–112)
Creatinine, Ser: 2.22 mg/dL — ABNORMAL HIGH (ref 0.50–1.10)
GFR calc non Af Amer: 23 mL/min — ABNORMAL LOW (ref >90)
GFR, EST AFRICAN AMERICAN: 26 mL/min — AB (ref >90)
Glucose, Bld: 85 mg/dL (ref 70–99)
Potassium: 5.1 mEq/L (ref 3.7–5.3)
SODIUM: 139 meq/L (ref 137–147)

## 2014-01-20 LAB — GLUCOSE, CAPILLARY
GLUCOSE-CAPILLARY: 141 mg/dL — AB (ref 70–99)
Glucose-Capillary: 106 mg/dL — ABNORMAL HIGH (ref 70–99)
Glucose-Capillary: 128 mg/dL — ABNORMAL HIGH (ref 70–99)
Glucose-Capillary: 154 mg/dL — ABNORMAL HIGH (ref 70–99)
Glucose-Capillary: 40 mg/dL — CL (ref 70–99)
Glucose-Capillary: 65 mg/dL — ABNORMAL LOW (ref 70–99)
Glucose-Capillary: 81 mg/dL (ref 70–99)
Glucose-Capillary: 83 mg/dL (ref 70–99)

## 2014-01-20 LAB — HEPATIC FUNCTION PANEL
ALBUMIN: 3.6 g/dL (ref 3.5–5.2)
ALK PHOS: 246 U/L — AB (ref 39–117)
ALT: 912 U/L — AB (ref 0–35)
AST: 1008 U/L — AB (ref 0–37)
Bilirubin, Direct: 2.3 mg/dL — ABNORMAL HIGH (ref 0.0–0.3)
Indirect Bilirubin: 1.2 mg/dL — ABNORMAL HIGH (ref 0.3–0.9)
TOTAL PROTEIN: 6.9 g/dL (ref 6.0–8.3)
Total Bilirubin: 3.5 mg/dL — ABNORMAL HIGH (ref 0.3–1.2)

## 2014-01-20 LAB — MITOCHONDRIAL ANTIBODIES: Mitochondrial M2 Ab, IgG: 0.32 (ref <0.91)

## 2014-01-20 MED ORDER — SODIUM CHLORIDE 0.9 % IJ SOLN: 10.0000 mL | INTRAMUSCULAR | Status: DC | PRN

## 2014-01-20 MED ORDER — SODIUM CHLORIDE 0.9 % IV SOLN
INTRAVENOUS | Status: DC
  Administered 2014-01-20: 18:00:00 via INTRAVENOUS

## 2014-01-20 MED ORDER — SODIUM CHLORIDE 0.9 % IV BOLUS (SEPSIS)
250.0000 mL | Freq: Once | INTRAVENOUS | Status: AC
  Administered 2014-01-20: 250 mL via INTRAVENOUS

## 2014-01-20 MED ORDER — DEXTROSE 50 % IV SOLN
INTRAVENOUS | Status: AC
  Administered 2014-01-20: 25 mL via INTRAVENOUS
  Filled 2014-01-20: qty 50

## 2014-01-20 NOTE — Progress Notes (Signed)
Peripherally Inserted Central Catheter/Midline Placement  The IV Nurse has discussed with the patient and/or persons authorized to consent for the patient, the purpose of this procedure and the potential benefits and risks involved with this procedure.  The benefits include less needle sticks, lab draws from the catheter and patient may be discharged home with the catheter.  Risks include, but not limited to, infection, bleeding, blood clot (thrombus formation), and puncture of an artery; nerve damage and irregular heat beat.  Alternatives to this procedure were also discussed.  PICC/Midline Placement Documentation        Andrea [name] 01/20/2014, 8:51 AM

## 2014-01-20 NOTE — Progress Notes (Signed)
Bladder scan done 198 ml.K. Craige Cotta of [facility] made aware with orders to give ns bolus 250 which was carried out. Pt denies any bladder discomfort nor tenderness. Refused to go to commode to urinate.

## 2014-01-20 NOTE — Progress Notes (Signed)
Hypoglycemic Event  CBG:40  Treatment: D50 IV 25 mL  Symptoms: Sweaty  Follow-up CBG: [id number] CBG Result:106  Possible Reasons for Event: Inadequate meal intake  Comments/MD notified:no     Werner Labella, Chucky May  Remember to initiate Hypoglycemia Order Set & complete

## 2014-01-20 NOTE — Progress Notes (Signed)
Inpatient Diabetes Program Recommendations  AACE/ADA: New Consensus Statement on Inpatient Glycemic Control (2013)  Target Ranges:  Prepandial:   less than 140 mg/dL      Peak postprandial:   less than 180 mg/dL (1-2 hours)      Critically ill patients:  140 - 180 mg/dL   Reason for Visit: Hypoglycemia  Pt received total of 3 units Novolog on 1/20.  On Januvia 100 mg at home.  Results for Andrea [name], Andrea [name] (MRN [id number]) as of 01/20/2014 14:52  Ref. Range 01/20/2014 03:51 01/20/2014 04:17 01/20/2014 07:24 01/20/2014 11:37 01/20/2014 14:09  Glucose-Capillary Latest Range: 70-99 mg/dL 40 (LL) 950 (H) 81 65 (L) 128 (H)  Results for Andrea [name], Andrea [name] (MRN [id number]) as of 01/20/2014 14:52  Ref. Range 01/18/2014 06:38  Hemoglobin A1C Latest Range: <5.7 % 6.4 (H)    Inpatient Diabetes Program Recommendations Correction (SSI): Change Novolog sensitive tidwc since pt is eating Oral Agents: Hold Januvia while inpatient HgbA1C: 6.4% - well controlled Diet: CHO mod med  Note: Will continue to follow. Thank you. Ailene Ards, RD, LDN, CDE Inpatient Diabetes Coordinator [phone]

## 2014-01-20 NOTE — Progress Notes (Signed)
0700 Had not voided yet , bladder not distended no urge to urinate . No tenderness of suprapubic area on palpation 0800 Dr. Gonzella Lex ;s  Order acknowledged . Carried out

## 2014-01-20 NOTE — Progress Notes (Signed)
[facility] PROGRESS NOTE  Andrea HeadingsCorinne G [name] WUJ:[id number] DOB: [birth year] DOA: 01/17/2014 PCP: Lolita PatellaEADE,ROBERT ALEXANDER, MD   Brief narrative 63 y.o. female with numerous medical problems including hypertension, coronary artery disease, ischemic cardiomyopathy s/p defibrillator, CKD stage 3, , CAD with hx of NSTEMI presented with feeling unwell for past 3-4 weeks . Reported nausea, vomiting and decreased PO intake, infrequent loose stools. She went to see GI 4 day prior to admission and had an US done that she states was WNL. She was supposed to follow up with cardiology next week but states she was tired of feeling bad and came to ER. In ER she was noted to be tachycardiac. Hypoglycemia down to 76. Ca of 5.7 and albumin of 2.2. Of note she had a gastric emptying study done in 2013 which was within normal limits.   Assessment/Plan:  Nausea/Vomiting/Upper Abdo Pain/Transaminitis  Etiology is unclear. Nausea and abdominal pain has  resolved but LFT's continue to rise. Pending LFTs today. [facility] is following. CTdid not show any hepatic abnormalities. US did not reveal any concerning findings either. Await Hepatitis Panel negative.  Possible for hepatic congestion from CHF, but clinically shows no signs of acute CHF.  Autoimmune w/up sent -Continue to monitor  AKI with oliguria Creatinine progressively increasing. Patient clinically dehydrated on admission however it has not improved despite IV fluid bolus and maintenance fluid. Renal ultrasound shows medical renal disease. Foley placed in. Ordered UA and urine lytes. Given her low EF , aggressive fluid challenge will be difficult. Renal consult called.  Hyperkalemia  Improved with Kayexalate. Continue to monitor.   Loose Stools  Most likely due to multiple doses of Kayexalate given 1/19. resolved   Hypocalcemia  Seems normal now.   Chronic systolic congestive heart failure, NYHA class 4-EF 20% per echo in 08/14 Holding Lasix as  patient was very dehydrated. Monitor volume status closely. Not on ACEI or ARB due to lack of tolerance in past with hypotension and ARF.   History of CAD with stent in RCA 2011  Continue Plavix.   Diabetes mellitus type 2 with Hypoglycemia  Sliding scale. Holding Januvia due to hypoglycemia. CBG's stable. A1c is 6.4.   Hypertension  Stable. Continue home meds.   Code Status: Full code   DVT Prophylaxis: sq heparin Family Communication: Discussed with patient  Disposition Plan: Home once improved   HPI/Subjective: C/o some shortness of breath and nausea. Denies further vomiting.   Objective: Filed Vitals:   01/20/14 0355  BP: 115/83  Pulse: 85  Temp: 97.7 F (36.5 C)  Resp: 17    Intake/Output Summary (Last 24 hours) at 01/20/14 1321 Last data filed at 01/20/14 [id number]  Gross per 24 hour  Intake   2160 ml  Output      1 ml  Net   2159 ml   Filed Weights   01/18/14 0512 01/19/14 0450 01/20/14 0355  Weight: 87.6 kg (193 lb 2 oz) 87.136 kg (192 lb 1.6 oz) 89.3 kg (196 lb 13.9 oz)    Exam:   General:  Elderly obese female lying in bed in no acute distress  HEENT: No pallor, no icterus, moist oral mucosa  Chest: Bilateral basilar crackles, no added sounds  CVS: Normal S1 and S2, no murmurs or gallop  Abdomen: Soft, nontender, nondistended, bowel sounds present  Extremities: Warm,no edema   CNS: AAOX3    Data Reviewed: Basic Metabolic Panel:  Recent Labs Lab 01/17/14 2212 01/18/14 0340 01/18/14 [id number] 01/18/14 1217 01/18/14 2130 01/19/14  0350 01/20/14 0625  NA 143  --  138 139 141 139 139  K 4.4  --  6.0* 6.0* 5.3 5.3 5.1  CL 116*  --  101 102 104 101 102  CO2 14*  --  17* 22 21 20  15*  GLUCOSE 71  --  154* 88 135* 107* 85  BUN 30*  --  46* 48* 50* 52* 62*  CREATININE 0.87  --  1.56* 1.66* 1.73* 1.85* 2.22*  CALCIUM 5.7*  --  9.2 9.3 9.1 8.8 8.8  MG  --  2.0  --   --   --   --   --   PHOS  --  5.3*  --   --   --   --   --    Liver Function  Tests:  Recent Labs Lab 01/17/14 2026 01/17/14 2212 01/18/14 0638 01/19/14 0350  AST NOT DONE 133* 383* 776*  ALT NOT DONE 150* 360* 642*  ALKPHOS NOT DONE 111 184* 192*  BILITOT 2.6* 1.6* 2.8* 3.1*  PROT 7.1 4.3* 6.8 6.3  ALBUMIN 3.7 2.2* 3.6 3.4*    Recent Labs Lab 01/17/14 2026  LIPASE 36   No results found for this basename: AMMONIA,  in the last 168 hours CBC:  Recent Labs Lab 01/17/14 2026 01/18/14 0638 01/19/14 0350 01/20/14 0625  WBC 5.8 6.7 5.7 7.8  NEUTROABS 3.7  --   --   --   HGB 12.8 12.6 12.6 14.0  HCT 37.1 36.9 37.5 40.9  MCV 67.9* 68.0* 68.6* 69.1*  PLT 273 206 185 159   Cardiac Enzymes: No results found for this basename: CKTOTAL, CKMB, CKMBINDEX, TROPONINI,  in the last 168 hours BNP (last 3 results)  Recent Labs  05/07/13 0630 08/19/13 1024 09/07/13 0955  PROBNP 800.6* 1769.0* 40.0   CBG:  Recent Labs Lab 01/20/14 0018 01/20/14 0351 01/20/14 0417 01/20/14 0724 01/20/14 1137  GLUCAP 83 40* 106* 81 65*    No results found for this or any previous visit (from the past 240 hour(s)).   Studies: Ct Abdomen Pelvis Wo Contrast  01/18/2014   CLINICAL DATA:  Nausea vomiting.  EXAM: CT ABDOMEN AND PELVIS WITHOUT CONTRAST  TECHNIQUE: Multidetector CT imaging of the abdomen and pelvis was performed following the standard protocol without intravenous contrast.  COMPARISON:  Ultrasound, 01/14/2014.  FINDINGS: Small to moderate right and minimal left pleural effusions. There is associated dependent atelectasis, with a possible dependent infiltrate on the right. Heart appears moderately enlarged.  Liver and spleen are unremarkable. Normal gallbladder. No bile duct dilation. Pancreas is unremarkable.  Prominent lymph nodes track along the gastrohepatic ligament, the largest measuring 11 mm in short axis. These are nonspecific and likely reactive.  No adrenal masses. Bilateral renal cortical thinning with lobulated renal contours. No renal masses. A  vascular calcification is noted on the left. No collecting system stones. No hydronephrosis. There is perinephric stranding bilaterally that is likely chronic. Normal ureters. The bladder is decompressed.  Uterus is prominent with a mildly lobulated contour at least 1 discrete partly calcified fibroid. Uterus otherwise unremarkable. No adnexal masses.  No other adenopathy.  No ascites.  Colon and small bowel are normal in caliber. There are air-fluid levels, nonspecific. No bowel wall thickening or mesenteric inflammation is seen. A normal appendix is visualized.  There are degenerative changes throughout the visualized spine. No acute fracture is evident, and there are no osteoblastic or osteolytic lesions. There also degenerative changes of both hips and SI joints.  IMPRESSION: 1. There are nonspecific air-fluid levels within the colon and small bowel without bowel wall thickening or inflammatory changes. This may reflect gastroenteritis. 2. No other evidence of an acute abnormality within the abdomen or pelvis. 3. Small to moderate right and minimal left pleural effusions. There is associated dependent lower lobe opacity the mostly on the right. This likely atelectasis. Infiltrate is possible. 4. Multiple chronic findings as detailed above.   Electronically Signed   By: Amie Portland M.D.   On: 01/18/2014 15:43   US Renal  01/20/2014   CLINICAL DATA:  History of hypertension and diabetes.  EXAM: RENAL/URINARY TRACT ULTRASOUND COMPLETE  COMPARISON:  Noncontrast CT scan of the abdomen and pelvis dated 18 January 2014.  FINDINGS: Right Kidney:  Length: 11.8 cm the echotexture of the renal cortex on the right is approximately equal to that of the adjacent liver. There is no focal mass nor hydronephrosis.  Left Kidney:  Length: 11.7 cm. There is no focal mass within the left kidney. The echotexture of the cortex is subjectively mildly increased. There is no hydronephrosis  Bladder:  The partially distended urinary  bladder is normal in appearance.  IMPRESSION: The echotexture both kidneys is mildly increased consistent with medical renal disease. There is no significant renal atrophy. There is no hydronephrosis.   Electronically Signed   By: David  [place]   On: 01/20/2014 10:00    Scheduled Meds: . bisoprolol  5 mg Oral Daily  . buPROPion  300 mg Oral Daily  . clopidogrel  75 mg Oral Q breakfast  . docusate sodium  100 mg Oral BID  . DULoxetine  60 mg Oral Daily  . folic acid  1 mg Oral Daily  . heparin subcutaneous  5,000 Units Subcutaneous Q8H  . insulin aspart  0-9 Units Subcutaneous Q4H  . pantoprazole  40 mg Oral Daily  . sodium chloride  3 mL Intravenous Q12H  . sodium chloride  3 mL Intravenous Q12H  . thiamine  100 mg Oral Daily   Continuous Infusions: . sodium chloride 75 mL/hr at 01/20/14 1694      Time spent: 25 MINUTES    Bradyn Vassey  [facility] Pager [phone]. If 7PM-7AM, please contact night-coverage at [email or web address], password [facility] 01/20/2014, 1:21 PM  LOS: 3 days

## 2014-01-20 NOTE — Progress Notes (Signed)
1100 BS =65 No sign of  Hypoglycemia . Placed on hypoglycemia protocol provided with apple juice, Cheree Ditto crckers, peanut butter. Continue monitoring done

## 2014-01-20 NOTE — Consult Note (Signed)
[facility] CONSULT NOTE    Date: 01/20/2014                  Patient Name:  Andrea [name]  MRN: [id number]  DOB: November 19, 1951  Age / Sex: 63 y.o., female         PCP: Lolita Patella, MD                 Service Requesting Consult: Internal Medicine                 Reason for Consult: AKI on CKD            History of Present Illness: Patient is a 63 y.o. female with a complex PMHx including HTN, CAD, Ischemic cardiomyopathy s/p AICD, CHF, DM-2, and prior CVA who was admitted to [facility] on 01/17/2014 with nausea, vomiting and poor PO intake.  Patient reports that prior to admission, she had 3-4 weeks of progressive nausea, vomiting and poor appetite.  No preceding event noted.  No recent fever, chills.  She saw her Gastroenterologist and an US of the abdomen was obtained and was unremarkable.  Her symptoms persisted and she began to have decrease urine output.  She then presented to the ED for evaluation.  On admission, patient was found to have LFT elevation.  She subsequently developed AKI, prompting renal consult.   Medications: Outpatient medications: Prescriptions prior to admission  Medication Sig Dispense Refill  . Acetaminophen (TYLENOL ARTHRITIS PAIN PO) Take 2 tablets by mouth as needed (pain).      Marland Kitchen atorvastatin (LIPITOR) 40 MG tablet Take 40 mg by mouth daily.      Marland Kitchen buPROPion (WELLBUTRIN XL) 150 MG 24 hr tablet Take 2 tablets (300 mg total) by mouth daily.  30 tablet  9  . clopidogrel (PLAVIX) 75 MG tablet Take 1 tablet (75 mg total) by mouth daily with breakfast.  30 tablet  11  . DULoxetine (CYMBALTA) 60 MG capsule Take 60 mg by mouth daily.        . furosemide (LASIX) 20 MG tablet Take 2 tablets (40 mg total) by mouth daily.  30 tablet  11  . JANUVIA 100 MG tablet Take 100 mg by mouth Daily.       Marland Kitchen loratadine (CLARITIN) 10 MG tablet Take 10 mg by mouth as needed for allergies.      . nitroGLYCERIN (NITROSTAT) 0.4 MG SL tablet Place 0.4 mg  under the tongue every 5 (five) minutes as needed for chest pain.      . pantoprazole (PROTONIX) 40 MG tablet Take 40 mg by mouth daily.      . potassium chloride SA (K-DUR,KLOR-CON) 20 MEQ tablet Take 1 tablet (20 mEq total) by mouth daily.  30 tablet  6   Current medications: Current Facility-Administered Medications  Medication Dose Route Frequency Provider Last Rate Last Dose  . 0.9 %  sodium chloride infusion   Intravenous Continuous Osvaldo Shipper, MD 75 mL/hr at 01/20/14 0609    . bisoprolol (ZEBETA) tablet 5 mg  5 mg Oral Daily Osvaldo Shipper, MD   5 mg at 01/20/14 1124  . buPROPion (WELLBUTRIN XL) 24 hr tablet 300 mg  300 mg Oral Daily Therisa Doyne, MD   300 mg at 01/20/14 1124  . clopidogrel (PLAVIX) tablet 75 mg  75 mg Oral Q breakfast Therisa Doyne, MD   75 mg at 01/20/14 0604  . dextrose (GLUTOSE) 40 % oral gel 37.5 g  1 Tube Oral  PRN Therisa Doyne, MD      . docusate sodium (COLACE) capsule 100 mg  100 mg Oral BID Therisa Doyne, MD   100 mg at 01/20/14 1125  . DULoxetine (CYMBALTA) DR capsule 60 mg  60 mg Oral Daily Therisa Doyne, MD   60 mg at 01/20/14 1124  . folic acid (FOLVITE) tablet 1 mg  1 mg Oral Daily Osvaldo Shipper, MD   1 mg at 01/20/14 1124  . glucose-Vitamin C 4-0.006 GM per chewable tablet 4 tablet  4 tablet Oral PRN Therisa Doyne, MD      . heparin injection 5,000 Units  5,000 Units Subcutaneous Q8H Osvaldo Shipper, MD   5,000 Units at 01/20/14 1400  . HYDROmorphone (DILAUDID) injection 0.5 mg  0.5 mg Intravenous Q6H PRN Osvaldo Shipper, MD   0.5 mg at 01/19/14 1853  . insulin aspart (novoLOG) injection 0-9 Units  0-9 Units Subcutaneous Q4H Therisa Doyne, MD   1 Units at 01/19/14 2045  . loratadine (CLARITIN) tablet 10 mg  10 mg Oral PRN Therisa Doyne, MD      . ondansetron (ZOFRAN) tablet 4 mg  4 mg Oral Q6H PRN Therisa Doyne, MD       Or  . ondansetron (ZOFRAN) injection 4 mg  4 mg Intravenous Q6H PRN Therisa Doyne,  MD      . pantoprazole (PROTONIX) EC tablet 40 mg  40 mg Oral Daily Therisa Doyne, MD   40 mg at 01/20/14 1125  . sodium chloride 0.9 % injection 10-40 mL  10-40 mL Intracatheter PRN Nishant Dhungel, MD      . sodium chloride 0.9 % injection 3 mL  3 mL Intravenous Q12H Therisa Doyne, MD   3 mL at 01/20/14 1126  . sodium chloride 0.9 % injection 3 mL  3 mL Intravenous Q12H Therisa Doyne, MD   3 mL at 01/20/14 1126  . sodium chloride 0.9 % injection 3 mL  3 mL Intravenous PRN Therisa Doyne, MD      . thiamine (VITAMIN B-1) tablet 100 mg  100 mg Oral Daily Osvaldo Shipper, MD   100 mg at 01/20/14 1125    Allergies: Allergies  Allergen Reactions  . Asa [Aspirin] Shortness Of Breath  . Aspirin Shortness Of Breath and Other (See Comments)    Asthma symptoms  . Brilinta [Ticagrelor] Shortness Of Breath and Other (See Comments)    Gout   . Percocet [Oxycodone-Acetaminophen] Nausea And Vomiting  . Darvon Nausea And Vomiting  . Diovan [Valsartan] Other (See Comments)    unknown  . Imitrex [Sumatriptan Base] Other (See Comments)    Unknown reaction  . Nsaids Nausea And Vomiting  . Nsaids Other (See Comments)    GI upset     Past Medical History: Past Medical History  Diagnosis Date  . Chronic systolic heart failure   . Hypertension   . Stroke 02/2010    left brain CVA? recurrent TIA's treated with TPA  . Dyslipidemia   . Premature ventricular contractions (PVCs) (VPCs)   . GERD (gastroesophageal reflux disease)   . Coronary artery disease     a. DES-dRCA 11/2010. b. DES-pRCA 04/2011 (in an area free of disease on prior cath). c. s/p DES-mRCA 03/2013 for NSTEMI.  . Ischemic cardiomyopathy     a. Hx of medtronic ICD. b. EF 15-20% by cath 03/2013.  . Pulmonary hypertension   . Gout   . Obesity   . Thrombocytopenia   . Depression   . Hypotension  a. Admission 09/2012 for hypotn & ARF. b. Meds held 03/2013 due to hypotension.  . Acute renal insufficiency     a.  09/2012. b. Also noted post-cath 03/2013.  . NSTEMI (non-ST elevated myocardial infarction) 03/2013  . Anginal pain   . Asthma   . Microcytic anemia     a. Noted 03/2013 on labs, iron studies WNL.  Marland Kitchen. Chronic lower back pain   . Anxiety   . CHF (congestive heart failure)   . CKD (chronic kidney disease) stage 3, GFR 30-59 ml/min   . HTN (hypertension)   . Primary pulmonary HTN   . Automatic implantable cardiac defibrillator - Medtronic 08/24/2013  . Automatic implantable cardioverter-defibrillator in situ   . Heart murmur   . Pneumonia ~ 2001; 2014    "double; late last year" (01/18/2014)  . Shortness of breath     "can happen at anytime" (01/18/2014)  . Type II diabetes mellitus   . Migraine headache     "no pain; aura/visual problems only; at least 2-3X/month" (01/18/2014)  . Headaches, cluster     "monthly" (01/18/2014)  . Arthritis     "knees" (01/18/2014)  . DJD (degenerative joint disease)   . Bipolar disorder    Past Surgical History: Past Surgical History  Procedure Laterality Date  . Mass excision Left     hip  . Cervical polypectomy  1970's  . [place] echocardiography  2011  . Tonsillectomy  1960's?  . Right oophorectomy  1980's  . Dilation and curettage of uterus  1970's  . Ostectomy metatarsal Left 1993    "5th toe; from rickets" (05/07/2013)  . Implantable cardioverter defibrillator implant  ~ 2008  . Coronary angioplasty with stent placement  2000's    "2 stents" (05/07/2013)  . Coronary angioplasty with stent placement  03/2013    PCI to RCA/notes 05/05/2013; "makes total of 3" (05/07/2013)   Family History: Family History  Problem Relation Age of Onset  . Heart failure Mother   . Heart attack Father   . Heart disease Brother    Social History: History   Social History  . Marital Status: Single    Spouse Name: N/A    Number of Children: 1  . Years of Education: N/A   Occupational History  .     Social History Main Topics  . Smoking status: Former Smoker --  0.05 packs/day for 5 years    Types: Cigarettes    Quit date: 04/23/1982  . Smokeless tobacco: Never Used  . Alcohol Use: No  . Drug Use: No  . Sexual Activity: Yes   Other Topics Concern  . Not on file   Social History Narrative   ** Merged History Encounter **       Review of Systems: As per HPI  Vital Signs: Blood pressure 128/88, pulse 88, temperature 97.9 F (36.6 C), temperature source Oral, resp. rate 18, height 5' (1.524 m), weight 196 lb 13.9 oz (89.3 kg), SpO2 100.00%.  Weight trends: Filed Weights   01/18/14 0512 01/19/14 0450 01/20/14 0355  Weight: 193 lb 2 oz (87.6 kg) 192 lb 1.6 oz (87.136 kg) 196 lb 13.9 oz (89.3 kg)    Physical Exam: General: resting in bed, appears ill; NAD. On [place] O2. HEENT: NCAT. No scleral icterus ntoed. Dry Mucous Membranes Cardiovascular: RRR. No murmur noted Respiratory: Bibasilar rales noted. Abdomen: soft, nontender, mildly distended abdomen.  Given body habitus no organomegaly was appreciated.  Extremities: No LE edema.  Skin: Warm, dry,  intact. Neuro: No focal deficits.   Lab results: Basic Metabolic Panel:  Recent Labs Lab 01/17/14 2212 01/18/14 0340  01/18/14 2130 01/19/14 0350 01/20/14 0625  NA 143  --   < > 141 139 139  K 4.4  --   < > 5.3 5.3 5.1  CL 116*  --   < > 104 101 102  CO2 14*  --   < > 21 20 15*  GLUCOSE 71  --   < > 135* 107* 85  BUN 30*  --   < > 50* 52* 62*  CREATININE 0.87  --   < > 1.73* 1.85* 2.22*  CALCIUM 5.7*  --   < > 9.1 8.8 8.8  MG  --  2.0  --   --   --   --   PHOS  --  5.3*  --   --   --   --   < > = values in this interval not displayed.  Liver Function Tests:  Recent Labs Lab 01/18/14 0638 01/19/14 0350 01/20/14 1330  AST 383* 776* 1008*  ALT 360* 642* 912*  ALKPHOS 184* 192* 246*  BILITOT 2.8* 3.1* 3.5*  PROT 6.8 6.3 6.9  ALBUMIN 3.6 3.4* 3.6    Recent Labs Lab 01/17/14 2026  LIPASE 36   CBC:  Recent Labs Lab 01/17/14 2026 01/18/14 0638 01/19/14 0350  01/20/14 0625  WBC 5.8 6.7 5.7 7.8  NEUTROABS 3.7  --   --   --   HGB 12.8 12.6 12.6 14.0  HCT 37.1 36.9 37.5 40.9  MCV 67.9* 68.0* 68.6* 69.1*  PLT 273 206 185 159   CBG:  Recent Labs Lab 01/20/14 0351 01/20/14 0417 01/20/14 0724 01/20/14 1137 01/20/14 1409  GLUCAP 40* 106* 81 65* 128*    Microbiology: Results for orders placed during the hospital encounter of 08/19/13  CULTURE, BLOOD (ROUTINE X 2)     Status: None   Collection Time    08/19/13 10:14 AM      Result Value Range Status   Specimen Description BLOOD LEFT ANTECUBITAL   Final   Special Requests BOTTLES DRAWN AEROBIC ONLY 5CC   Final   Culture  Setup Time     Final   Value: 08/19/2013 14:08     Performed at [facility]   Culture     Final   Value: STAPHYLOCOCCUS SPECIES (COAGULASE NEGATIVE)     Note: THE SIGNIFICANCE OF ISOLATING THIS ORGANISM FROM A SINGLE SET OF BLOOD CULTURES WHEN MULTIPLE SETS ARE DRAWN IS UNCERTAIN. PLEASE NOTIFY THE MICROBIOLOGY DEPARTMENT WITHIN ONE WEEK IF SPECIATION AND SENSITIVITIES ARE REQUIRED.     Note: Gram Stain Report Called to,Read Back By and Verified With: LINDA B @ [phone] 08/20/13 BY KRAWS     Performed at [facility]   Report Status 08/22/2013 FINAL   Final  URINE CULTURE     Status: None   Collection Time    08/19/13 10:29 AM      Result Value Range Status   Specimen Description URINE, CATHETERIZED   Final   Special Requests NONE   Final   Culture  Setup Time     Final   Value: 08/19/2013 11:40     Performed at [facility] Count     Final   Value: NO GROWTH     Performed at [facility]   Culture     Final   Value: NO GROWTH  Performed at [facility]   Report Status 08/20/2013 FINAL   Final  CULTURE, BLOOD (ROUTINE X 2)     Status: None   Collection Time    08/19/13 11:10 AM      Result Value Range Status   Specimen Description BLOOD RIGHT HAND   Final   Special Requests BOTTLES DRAWN AEROBIC ONLY 10CC    Final   Culture  Setup Time     Final   Value: 08/19/2013 14:08     Performed at [facility]   Culture     Final   Value: NO GROWTH 5 DAYS     Performed at [facility]   Report Status 08/25/2013 FINAL   Final  MRSA PCR SCREENING     Status: None   Collection Time    08/19/13 12:41 PM      Result Value Range Status   MRSA by PCR NEGATIVE  NEGATIVE Final   Comment:            The GeneXpert MRSA Assay (FDA     approved for NASAL specimens     only), is one component of a     comprehensive MRSA colonization     surveillance program. It is not     intended to diagnose MRSA     infection nor to guide or     monitor treatment for     MRSA infections.    Imaging: Ct Abdomen Pelvis Wo Contrast 01/18/2014   IMPRESSION: 1. There are nonspecific air-fluid levels within the colon and small bowel without bowel wall thickening or inflammatory changes. This may reflect gastroenteritis. 2. No other evidence of an acute abnormality within the abdomen or pelvis. 3. Small to moderate right and minimal left pleural effusions. There is associated dependent lower lobe opacity the mostly on the right. This likely atelectasis. Infiltrate is possible. 4. Multiple chronic findings as detailed above.   Dg Chest 2 View 01/17/2014   IMPRESSION: 1. Small right pleural effusion noted. Mild medial right basilar airspace opacity may reflect atelectasis or pneumonia. 2. Cardiomegaly noted.    US Renal 01/20/2014   IMPRESSION: The echotexture both kidneys is mildly increased consistent with medical renal disease. There is no significant renal atrophy. There is no hydronephrosis.   US Abdomen Limited Ruq 01/18/2014  IMPRESSION: 1. Gallbladder unremarkable in appearance. 2. Trace ascites incidentally noted about the liver, of uncertain significance.    Assessment & Plan: Patient is a 63 y.o. female with a complex PMHx including HTN, CAD, Ischemic cardiomyopathy s/p AICD, CHF, DM-2, and prior CVA who  was admitted to [facility] on 01/17/2014 with nausea, vomiting and poor PO intake.  1) Nausea/vomiting/decreased PO intake and Transaminitis - GI now following.  Work up negative thus far: Hep A, B, C negative, US unremarkable, CT abdomen (see above) did not reveal underlying GB or other pathology, Lipase normal. 2) AKI on CKD-II patient known well to me,. This appears intravasc depletion (functional with liver disease). Will eval for coexistent AIN,AGN 3) HTN 4) CAD, Ischemic cardiomyopathy EF 20% s/p AICD 5) DM-2 6) Hx of CVA 7) HLD 8) Depression 9) GERD  Plan: 1) Nausea/vomiting/decreased PO intake and Transaminitis - Workup: Hep A, B, C negative, US unremarkable, CT abdomen (see above) did not reveal underlying GB or other pathology, Lipase normal. - GI now following  - Autoimmune workup underway.  - ? Need for biopsy if no improve or etiology found on workup. - Workup per primary/GI  2) AKI on CKD-II - Baseline GFR ~70; Creatinine 0.8 - 1.0. - Creatinine now 2.22 with GFR down to 26. - Patient now with AG metabolic acidosis - She appears dry clinically.  Will start IV fluids - NS @ 100 mL/hr and monitor for improvement. - Obtaining Urine Sodium and Creatinine to assess volume status.  - Renal US obtained (see above) - Iron studies done; patient with low iron and % sat. Will hold off on treatment until acute illness improves/resolves. - Daily metabolic panel.   3) HTN - Continue home Bisoprolol - Will continue to monitor closely; currently well controlled.   4) Rest per primary   Everlene Other DO Family Medicine PGY-2 I have seen and examined this patient and agree with the plan of care seen,eval,examined and discussed with resident. Patient and family counseled and questions answered. .  Dalisha Shively L 01/20/2014, 5:18 PM

## 2014-01-21 DIAGNOSIS — K7290 Hepatic failure, unspecified without coma: Secondary | ICD-10-CM | POA: Diagnosis present

## 2014-01-21 LAB — GLUCOSE, CAPILLARY
GLUCOSE-CAPILLARY: 130 mg/dL — AB (ref 70–99)
GLUCOSE-CAPILLARY: 127 mg/dL — AB (ref 70–99)
GLUCOSE-CAPILLARY: 148 mg/dL — AB (ref 70–99)
Glucose-Capillary: 113 mg/dL — ABNORMAL HIGH (ref 70–99)
Glucose-Capillary: 183 mg/dL — ABNORMAL HIGH (ref 70–99)
Glucose-Capillary: 66 mg/dL — ABNORMAL LOW (ref 70–99)
Glucose-Capillary: 74 mg/dL (ref 70–99)
Glucose-Capillary: 74 mg/dL (ref 70–99)

## 2014-01-21 LAB — COMPREHENSIVE METABOLIC PANEL
ALT: 1042 U/L — ABNORMAL HIGH (ref 0–35)
ALT: 1136 U/L — AB (ref 0–35)
AST: 1245 U/L — ABNORMAL HIGH (ref 0–37)
AST: 1340 U/L — ABNORMAL HIGH (ref 0–37)
Albumin: 3.5 g/dL (ref 3.5–5.2)
Albumin: 3.5 g/dL (ref 3.5–5.2)
Alkaline Phosphatase: 205 U/L — ABNORMAL HIGH (ref 39–117)
Alkaline Phosphatase: 214 U/L — ABNORMAL HIGH (ref 39–117)
BUN: 75 mg/dL — ABNORMAL HIGH (ref 6–23)
BUN: 72 mg/dL — AB (ref 6–23)
CO2: 16 mEq/L — ABNORMAL LOW (ref 19–32)
CO2: 13 meq/L — AB (ref 19–32)
Calcium: 8.4 mg/dL (ref 8.4–10.5)
Calcium: 8.4 mg/dL (ref 8.4–10.5)
Chloride: 101 mEq/L (ref 96–112)
Chloride: 100 mEq/L (ref 96–112)
Creatinine, Ser: 2.62 mg/dL — ABNORMAL HIGH (ref 0.50–1.10)
Creatinine, Ser: 2.53 mg/dL — ABNORMAL HIGH (ref 0.50–1.10)
GFR calc Af Amer: 22 mL/min — ABNORMAL LOW (ref >90)
GFR calc non Af Amer: 18 mL/min — ABNORMAL LOW (ref >90)
GFR, EST AFRICAN AMERICAN: 21 mL/min — AB (ref >90)
GFR, EST NON AFRICAN AMERICAN: 19 mL/min — AB (ref >90)
GLUCOSE: 72 mg/dL (ref 70–99)
Glucose, Bld: 110 mg/dL — ABNORMAL HIGH (ref 70–99)
POTASSIUM: 5.8 meq/L — AB (ref 3.7–5.3)
POTASSIUM: 5.6 meq/L — AB (ref 3.7–5.3)
Sodium: 138 mEq/L (ref 137–147)
Sodium: 137 mEq/L (ref 137–147)
TOTAL PROTEIN: 6.6 g/dL (ref 6.0–8.3)
TOTAL PROTEIN: 6.5 g/dL (ref 6.0–8.3)
Total Bilirubin: 3.1 mg/dL — ABNORMAL HIGH (ref 0.3–1.2)
Total Bilirubin: 3.5 mg/dL — ABNORMAL HIGH (ref 0.3–1.2)

## 2014-01-21 LAB — PROTIME-INR
INR: 4.94 — ABNORMAL HIGH (ref 0.00–1.49)
Prothrombin Time: 44 seconds — ABNORMAL HIGH (ref 11.6–15.2)

## 2014-01-21 LAB — ACETAMINOPHEN LEVEL: Acetaminophen (Tylenol), Serum: <15 ug/mL (ref 10–30)

## 2014-01-21 LAB — AMMONIA: Ammonia: 48 umol/L (ref 11–60)

## 2014-01-21 LAB — ANTI-SMOOTH MUSCLE ANTIBODY, IGG: F-Actin IgG: 7 U (ref <20)

## 2014-01-21 MED ORDER — STERILE WATER FOR INJECTION IV SOLN
INTRAVENOUS | Status: DC
  Administered 2014-01-21 – 2014-01-22 (×2): via INTRAVENOUS
  Filled 2014-01-21 (×2): qty 850

## 2014-01-21 MED ORDER — SODIUM BICARBONATE 8.4 % IV SOLN: INTRAVENOUS | Status: DC

## 2014-01-21 MED ORDER — BISOPROLOL FUMARATE 5 MG PO TABS: 5.0000 mg | ORAL_TABLET | Freq: Every day | ORAL | Status: DC

## 2014-01-21 MED ORDER — PHYTONADIONE 5 MG PO TABS
5.0000 mg | ORAL_TABLET | Freq: Once | ORAL | Status: AC
  Administered 2014-01-21: 5 mg via ORAL
  Filled 2014-01-21: qty 1

## 2014-01-21 MED ORDER — SODIUM POLYSTYRENE SULFONATE 15 GM/60ML PO SUSP
15.0000 g | Freq: Once | ORAL | Status: AC
  Administered 2014-01-21: 15 g via ORAL
  Filled 2014-01-21: qty 60

## 2014-01-21 MED ORDER — SODIUM POLYSTYRENE SULFONATE 15 GM/60ML PO SUSP
30.0000 g | Freq: Once | ORAL | Status: AC
  Administered 2014-01-21: 30 g via ORAL
  Filled 2014-01-21: qty 120

## 2014-01-21 NOTE — Progress Notes (Addendum)
[facility] PROGRESS NOTE  Andrea HeadingsCorinne G [name] WNU:[id number] DOB: [birth year] DOA: 01/17/2014 PCP: Lolita PatellaEADE,Andrea ALEXANDER, MD  Brief narrative  63 y.o. female with numerous medical problems including hypertension, coronary artery disease, ischemic cardiomyopathy s/p defibrillator, CKD stage 3, , CAD with hx of NSTEMI presented with feeling unwell for past 3-4 weeks . Reported nausea, vomiting and decreased PO intake, infrequent loose stools. She went to see [name] 4 day prior to admission and had an US done that she states was WNL. She was supposed to follow up with cardiology next week but states she was tired of feeling bad and came to ER. In ER she was noted to be tachycardiac. Hypoglycemia down to 76. Ca of 5.7 and albumin of 2.2. Of note she had a gastric emptying study done in 2013 which was within normal limits.    Assessment/Plan:  Nausea/Vomiting/Upper Abdo Pain/Transaminitis  Etiology is unclear. Nausea and abdominal pain has resolved but LFT's continue to rise. Andrea [name]. Andrea [name] is following. CTdid not show any hepatic abnormalities. US did not reveal any concerning findings either. Hepatitis Panel negative. autoimmune w/up pending. antimitochondrial ab negative. -unlikely  hepatic congestion from CHF,  clinically hypovolemic  -if liver fn continues to worsen will need transfer to to tertiary center for transplant evaluation.  -check INR and ammonia.  AKI with oliguria  Creatinine progressively increasing. Patient clinically dehydrated on admission however it has not improved despite IV fluid bolus and maintenance fluid. Renal ultrasound shows medical renal disease. Foley placed in. 600 cc output past 24 hrs. Low urine sodium suggests prerenal etiology. Now has metabolic acidosis secondary to dehydration.  appreciate renal consult. Recommend switching IV fluids to isotonic  Bicarb.   Hyperkalemia  Ordered kayexalate again. Monitor   Loose Stools  Most likely due to multiple doses of  Kayexalate given 1/19. resolved   Hypocalcemia  Seems normal now.   Chronic systolic congestive heart failure, NYHA class 4-EF 20% per echo in 08/14  Holding Lasix as patient was very dehydrated. Monitor volume status closely. Not on ACEI or ARB due to lack of tolerance in past with hypotension and ARF.   History of CAD with stent in RCA 2011  Hold plavix if she is going to need liver biopsy.  Diabetes mellitus type 2 with Hypoglycemia  Sliding scale. Holding Januvia due to hypoglycemia. CBG's stable. A1c is 6.4.   Hypertension  Stable. Continue home meds.   Code Status: Full code  DVT Prophylaxis: sq heparin  Family Communication: Discussed with patient  Disposition Plan: Home once improved   HPI/Subjective:  C/o some shortness of breath and nausea. Denies further vomiting.    Objective: Filed Vitals:   01/21/14 1012  BP: 123/83  Pulse: 82  Temp: 97.4 F (36.3 C)  Resp: 18    Intake/Output Summary (Last 24 hours) at 01/21/14 1140 Last data filed at 01/21/14 1047  Gross per 24 hour  Intake 1733.34 ml  Output    725 ml  Net 1008.34 ml   Filed Weights   01/19/14 0450 01/20/14 0355 01/21/14 0429  Weight: 87.136 kg (192 lb 1.6 oz) 89.3 kg (196 lb 13.9 oz) 91.5 kg (201 lb 11.5 oz)    Exam: General: Elderly obese female lying in bed in no acute distress , appears sleepy HEENT: No pallor, no icterus, moist oral mucosa  Chest: clear b/l, no added sounds  CVS: Normal S1 and S2, no murmurs or gallop  Abdomen: Soft, nontender, nondistended, bowel sounds present  Extremities: Warm,no edema  CNS: AAOX3   Data Reviewed: Basic Metabolic Panel:  Recent Labs Lab 01/17/14 2212 01/18/14 0340  01/18/14 2130 01/19/14 0350 01/20/14 0625 01/21/14 0025 01/21/14 0810  NA 143  --   < > 141 139 139 138 137  K 4.4  --   < > 5.3 5.3 5.1 5.6* 5.8*  CL 116*  --   < > 104 101 102 101 100  CO2 14*  --   < > 21 20 15* 16* 13*  GLUCOSE 71  --   < > 135* 107* 85 110* 72  BUN  30*  --   < > 50* 52* 62* 72* 75*  CREATININE 0.87  --   < > 1.73* 1.85* 2.22* 2.62* 2.53*  CALCIUM 5.7*  --   < > 9.1 8.8 8.8 8.4 8.4  MG  --  2.0  --   --   --   --   --   --   PHOS  --  5.3*  --   --   --   --   --   --   < > = values in this interval not displayed. Liver Function Tests:  Recent Labs Lab 01/18/14 0638 01/19/14 0350 01/20/14 1330 01/21/14 0025 01/21/14 0810  AST 383* 776* 1008* 1245* 1340*  ALT 360* 642* 912* 1042* 1136*  ALKPHOS 184* 192* 246* 205* 214*  BILITOT 2.8* 3.1* 3.5* 3.1* 3.5*  PROT 6.8 6.3 6.9 6.5 6.6  ALBUMIN 3.6 3.4* 3.6 3.5 3.5    Recent Labs Lab 01/17/14 2026  LIPASE 36   No results found for this basename: AMMONIA,  in the last 168 hours CBC:  Recent Labs Lab 01/17/14 2026 01/18/14 0638 01/19/14 0350 01/20/14 0625  WBC 5.8 6.7 5.7 7.8  NEUTROABS 3.7  --   --   --   HGB 12.8 12.6 12.6 14.0  HCT 37.1 36.9 37.5 40.9  MCV 67.9* 68.0* 68.6* 69.1*  PLT 273 206 185 159   Cardiac Enzymes: No results found for this basename: CKTOTAL, CKMB, CKMBINDEX, TROPONINI,  in the last 168 hours BNP (last 3 results)  Recent Labs  05/07/13 0630 08/19/13 1024 09/07/13 0955  PROBNP 800.6* 1769.0* 40.0   CBG:  Recent Labs Lab 01/20/14 2055 01/21/14 0022 01/21/14 0427 01/21/14 0735 01/21/14 0815  GLUCAP 141* 113* 74 66* 74    No results found for this or any previous visit (from the past 240 hour(s)).   Studies: US Renal  01/20/2014   CLINICAL DATA:  History of hypertension and diabetes.  EXAM: RENAL/URINARY TRACT ULTRASOUND COMPLETE  COMPARISON:  Noncontrast CT scan of the abdomen and pelvis dated 18 January 2014.  FINDINGS: Right Kidney:  Length: 11.8 cm the echotexture of the renal cortex on the right is approximately equal to that of the adjacent liver. There is no focal mass nor hydronephrosis.  Left Kidney:  Length: 11.7 cm. There is no focal mass within the left kidney. The echotexture of the cortex is subjectively mildly  increased. There is no hydronephrosis  Bladder:  The partially distended urinary bladder is normal in appearance.  IMPRESSION: The echotexture both kidneys is mildly increased consistent with medical renal disease. There is no significant renal atrophy. There is no hydronephrosis.   Electronically Signed   By: David  [place]   On: 01/20/2014 10:00    Scheduled Meds: . bisoprolol  5 mg Oral Daily  . docusate sodium  100 mg Oral BID  . folic acid  1 mg Oral  Daily  . heparin subcutaneous  5,000 Units Subcutaneous Q8H  . insulin aspart  0-9 Units Subcutaneous Q4H  . pantoprazole  40 mg Oral Daily  . sodium chloride  3 mL Intravenous Q12H  . sodium chloride  3 mL Intravenous Q12H  . thiamine  100 mg Oral Daily   Continuous Infusions: .  sodium bicarbonate 150 mEq in sterile water 1000 mL infusion         Time spent: 35 minutes    Ramla Hase  [facility] Pager [phone]. If 7PM-7AM, please contact night-coverage at [email or web address], password [name] 01/21/2014, 11:40 AM  LOS: 4 days

## 2014-01-21 NOTE — Progress Notes (Signed)
Called out [facility] and [facility] for transferring patient for evaluation of liver transplant.  spoke with hepatology attending at [facility] who recommended given her severe  cardiomyopathy she will not be a candidate for transplant and recommended supportive care.  awaiting call back from [facility]. Patient and family informed.

## 2014-01-21 NOTE — Progress Notes (Signed)
CBG 66. 4oz orange juice given to patient. Patient asymptomatic at this time. Will recheck blood sugar.

## 2014-01-21 NOTE — Progress Notes (Signed)
[facility] Progress Note   Subjective:   Patient feeling poorly this am. Still have nausea and decrease PO intake.   Objective:   BP 123/83  Pulse 82  Temp(Src) 97.4 F (36.3 C) (Oral)  Resp 18  Ht 5' (1.524 m)  Wt 201 lb 11.5 oz (91.5 kg)  BMI 39.40 kg/m2  SpO2 98%  Intake/Output Summary (Last 24 hours) at 01/21/14 1059 Last data filed at 01/21/14 1047  Gross per 24 hour  Intake 1733.34 ml  Output    725 ml  Net 1008.34 ml   Weight change: 4 lb 13.6 oz (2.2 kg)  Physical Exam: General: resting in bed, appears ill; NAD. HEENT: No scleral icterus currently. Cardiovascular: RRR. 2/6 Systolic murmur noted.  Respiratory: Bibasilar rales noted.  Abdomen: soft, nontender, mildly  distended abdomen. Given body habitus no organomegaly was appreciated.  Extremities: No LE edema.  Neuro: No focal deficits but patient quite lethargic.  Imaging: US Renal  01/20/2014   CLINICAL DATA:  History of hypertension and diabetes.  EXAM: RENAL/URINARY TRACT ULTRASOUND COMPLETE  COMPARISON:  Noncontrast CT scan of the abdomen and pelvis dated 18 January 2014.  FINDINGS: Right Kidney:  Length: 11.8 cm the echotexture of the renal cortex on the right is approximately equal to that of the adjacent liver. There is no focal mass nor hydronephrosis.  Left Kidney:  Length: 11.7 cm. There is no focal mass within the left kidney. The echotexture of the cortex is subjectively mildly increased. There is no hydronephrosis  Bladder:  The partially distended urinary bladder is normal in appearance.  IMPRESSION: The echotexture both kidneys is mildly increased consistent with medical renal disease. There is no significant renal atrophy. There is no hydronephrosis.   Electronically Signed   By: David  Martinique   On: 01/20/2014 10:00    Labs: BMET  Recent Labs Lab 01/17/14 2212 01/18/14 0340 01/18/14 8250 01/18/14 1217 01/18/14 2130 01/19/14 0350 01/20/14 0625 01/21/14 0025  01/21/14 0810  NA 143  --  138 139 141 139 139 138 137  K 4.4  --  6.0* 6.0* 5.3 5.3 5.1 5.6* 5.8*  CL 116*  --  101 102 104 101 102 101 100  CO2 14*  --  17* _0 15* 16* 13*  GLUCOSE 71  --  154* 88 135* 107* 85 110* 72  BUN 30*  --  46* 48* 50* 52* 62* 72* 75*  CREATININE 0.87  --  1.56* 1.66* 1.73* 1.85* 2.22* 2.62* 2.53*  CALCIUM 5.7*  --  9.2 9.3 9.1 8.8 8.8 8.4 8.4  PHOS  --  5.3*  --   --   --   --   --   --   --    CBC  Recent Labs Lab 01/17/14 2026 01/18/14 0638 01/19/14 0350 01/20/14 0625  WBC 5.8 6.7 5.7 7.8  NEUTROABS 3.7  --   --   --   HGB 12.8 12.6 12.6 14.0  HCT 37.1 36.9 37.5 40.9  MCV 67.9* 68.0* 68.6* 69.1*  PLT 273 206 185 159    Medications:    . bisoprolol  5 mg Oral Daily  . docusate sodium  100 mg Oral BID  . folic acid  1 mg Oral Daily  . heparin subcutaneous  5,000 Units Subcutaneous Q8H  . insulin aspart  0-9 Units Subcutaneous Q4H  . pantoprazole  40 mg Oral Daily  . sodium chloride  3 mL Intravenous Q12H  . sodium chloride  3 mL Intravenous Q12H  . sodium polystyrene  30 g Oral Once  . thiamine  100 mg Oral Daily    Assessment/ Plan:   Patient is a 63 y.o. female with a complex PMHx including HTN, CAD, Ischemic cardiomyopathy s/p AICD, CHF, DM-2, and prior CVA who was admitted to [facility] on 01/17/2014 with nausea, vomiting and poor PO intake.   1) Nausea/vomiting/decreased PO intake and Transaminitis - GI now following. Work up negative thus far: Hep A, B, C negative, US unremarkable, CT abdomen (see above) did not reveal underlying GB or other pathology, Lipase normal.  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  - GI now following  - Autoimmune workup underway.  LFT's and bili continuing to rise  - ? 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  Patient with AG Met. Acidosis - Urine sodium 20, signifying intravascular depletion Renal injury secondary to primary and secondary effects of liver injury - Iron studies done; patient with low iron and % sat. Will hold off on treatment until acute illness improves/resolves.  - Now with worsening acid base and hyperkalemia.  Will change IV fluids to isotonic  Na Bicarb 50 mL/hr. - Daily Renal panel - Will monitor closely.  Prognosis is guarded given liver failure and further acid base/hyperkalemia/worsening renal function.  3) HTN  - Continue home Bisoprolol  - Will continue to monitor closely; currently well controlled.   4) Rest per primary   Coral Spikes, DO PGY-2, [facility] 01/21/2014, 10:59 AM I have seen and examined this patient and agree with the plan of care seen, eval , examined and discussed. .  Marguis Mathieson L 01/21/2014, 11:24 AM

## 2014-01-21 NOTE — Discharge Summary (Addendum)
Physician Discharge Summary  Andrea [name] ZOX:[id number] DOB: [birth year] DOA: 01/17/2014  PCP: Lolita Patella, MD  Admit date: 01/17/2014 Discharge date: 01/22/2014  Time spent: 40  minutes  Recommendations for Outpatient Follow-up:  1. Discharge to [facility] for evaluation for liver transplant. Accepting physician is Dr Garen Lah, MD  Discharge Diagnoses:  Principal Problem:   Fulminant hepatic failure  Active Problems:   Chronic systolic congestive heart failure, NYHA class 4-EF 20% per echo in November 2014   Hypertension   Diabetes mellitus type 2, controlled   Dehydration   Acute renal failure   Hypocalcemia   Hypoglycemia   Failure to thrive   Oliguria   Discharge Condition: guarded  Diet recommendation: cardiac  Filed Weights   01/19/14 0450 01/20/14 0355 01/21/14 0429  Weight: 87.136 kg (192 lb 1.6 oz) 89.3 kg (196 lb 13.9 oz) 91.5 kg (201 lb 11.5 oz)    History of present illness:  63 y.o. female with numerous medical problems including hypertension, coronary artery disease, ischemic cardiomyopathy s/p defibrillator, CKD stage 3, , CAD with hx of NSTEMI presented with feeling unwell for past 3-4 weeks . Reported nausea, vomiting and decreased PO intake, infrequent loose stools. She went to see GI 4 day prior to admission and had an US done that she states was WNL. She was supposed to follow up with cardiology next week but states she was tired of feeling bad and came to ER. In ER she was noted to be tachycardiac. Hypoglycemia down to 76. Ca of 5.7 and albumin of 2.2. Of note she had a gastric emptying study done in 2013 which was within normal limits.   Hospital Course:  Nausea/Vomiting/Upper Abdo Pain/Transaminitis  Etiology is unclear. Nausea and abdominal pain has resolved but LFT's continue to rise. [facility] is following. CTdid not show any hepatic abnormalities. [place] did not reveal any concerning findings either. Hepatitis Panel negative.CMV IgM ab  pending. autoimmune w/up pending. antimitochondrial ab negative.  -unlikely hepatic congestion from CHF, clinically hypovolemic  Liver function  continues to worsen with significant transaminitis and elevated INR of 4.94and will will need transfer to to tertiary center for transplant evaluation. Discussed with Dr. Cheree Ditto at [facility] who has of the above said patient for transfer he did awaiting hospital bed. -INR 3.80 today. Ordered another dose of vitamin k.   AKI with oliguria  Creatinine progressively increasing. Patient clinically dehydrated on admission however it has not improved despite IV fluid bolus and maintenance fluid. Renal ultrasound shows medical renal disease. Foley placed in. 600 cc output past 24 hrs. Low urine sodium suggests prerenal etiology. Now has metabolic acidosis secondary to dehydration.  appreciate renal consult. Switched fluid to  fluids to isotonic Bicarb. Metabolic acidosis resolved. Renal function improving. Switched to IV normal saline. Monitor strict I/O.   Hyperkalemia   Improved with Kayexalate  Loose Stools  Most likely due to multiple doses of Kayexalate given 1/19. resolved    Hypocalcemia   normal now.   Chronic systolic congestive heart failure, NYHA class 4-EF 20% per echo in 08/14  Holding Lasix as patient was very dehydrated. Monitor volume status closely. Not on ACEI or ARB due to lack of tolerance in past with hypotension and ARF.   History of CAD with stent in RCA 2011  On plavix. Holding statin due to transaminitis.  Diabetes mellitus type 2 with Hypoglycemia  On sliding scale. Holding Januvia due to hypoglycemia. CBG's stable. A1c is 6.4.   Hypertension  Stable.  Continue home meds.   Code Status: Full code   DVT Prophylaxis: SCD  Family Communication: Discussed with patient and her partner Disposition Plan: transfer to [facility] once bed available   Discharge Exam: Filed Vitals:   01/22/14  BP: 112/76  Pulse: 82  Temp:  97.12F  Resp: 18     General: Elderly obese female lying in bed in no acute distress , appears sleepy  HEENT: No pallor, no icterus, moist oral mucosa  Chest: clear b/l, no added sounds  CVS: Normal S1 and S2, no murmurs or gallop  Abdomen: Soft, nontender, nondistended, bowel sounds present  Extremities: Warm,no edema  CNS: AAOX3  Discharge Instructions   Future Appointments Provider Department Dept Phone   02/23/2014 4:00 PM Peter M [place], MD [facility] [phone]       Medication List    STOP taking these medications       atorvastatin 40 MG tablet  Commonly known as:  LIPITOR     buPROPion 150 MG 24 hr tablet  Commonly known as:  WELLBUTRIN XL     DULoxetine 60 MG capsule  Commonly known as:  CYMBALTA     furosemide 20 MG tablet  Commonly known as:  LASIX     JANUVIA 100 MG tablet  Generic drug:  sitaGLIPtin     loratadine 10 MG tablet  Commonly known as:  CLARITIN     potassium chloride SA 20 MEQ tablet  Commonly known as:  K-DUR,KLOR-CON     TYLENOL ARTHRITIS PAIN PO      TAKE these medications       bisoprolol 5 MG tablet  Commonly known as:  ZEBETA  Take 1 tablet (5 mg total) by mouth daily.     clopidogrel 75 MG tablet  Commonly known as:  PLAVIX  Take 1 tablet (75 mg total) by mouth daily with breakfast.     nitroGLYCERIN 0.4 MG SL tablet  Commonly known as:  NITROSTAT  Place 0.4 mg under the tongue every 5 (five) minutes as needed for chest pain.     pantoprazole 40 MG tablet  Commonly known as:  PROTONIX  Take 40 mg by mouth daily.       Allergies  Allergen Reactions  . Asa [Aspirin] Shortness Of Breath  . Aspirin Shortness Of Breath and Other (See Comments)    Asthma symptoms  . Brilinta [Ticagrelor] Shortness Of Breath and Other (See Comments)    Gout   . Percocet [Oxycodone-Acetaminophen] Nausea And Vomiting  . Darvon Nausea And Vomiting  . Diovan [Valsartan] Other (See Comments)    unknown  . Imitrex  [Sumatriptan Base] Other (See Comments)    Unknown reaction  . Nsaids Nausea And Vomiting  . Nsaids Other (See Comments)    GI upset       The results of significant diagnostics from this hospitalization (including imaging, microbiology, ancillary and laboratory) are listed below for reference.    Significant Diagnostic Studies: Ct Abdomen Pelvis Wo Contrast  01/18/2014   CLINICAL DATA:  Nausea vomiting.  EXAM: CT ABDOMEN AND PELVIS WITHOUT CONTRAST  TECHNIQUE: Multidetector CT imaging of the abdomen and pelvis was performed following the standard protocol without intravenous contrast.  COMPARISON:  Ultrasound, 01/14/2014.  FINDINGS: Small to moderate right and minimal left pleural effusions. There is associated dependent atelectasis, with a possible dependent infiltrate on the right. Heart appears moderately enlarged.  Liver and spleen are unremarkable. Normal gallbladder. No bile duct dilation. Pancreas is unremarkable.  Prominent lymph nodes track along the gastrohepatic ligament, the largest measuring 11 mm in short axis. These are nonspecific and likely reactive.  No adrenal masses. Bilateral renal cortical thinning with lobulated renal contours. No renal masses. A vascular calcification is noted on the left. No collecting system stones. No hydronephrosis. There is perinephric stranding bilaterally that is likely chronic. Normal ureters. The bladder is decompressed.  Uterus is prominent with a mildly lobulated contour at least 1 discrete partly calcified fibroid. Uterus otherwise unremarkable. No adnexal masses.  No other adenopathy.  No ascites.  Colon and small bowel are normal in caliber. There are air-fluid levels, nonspecific. No bowel wall thickening or mesenteric inflammation is seen. A normal appendix is visualized.  There are degenerative changes throughout the visualized spine. No acute fracture is evident, and there are no osteoblastic or osteolytic lesions. There also degenerative  changes of both hips and SI joints.  IMPRESSION: 1. There are nonspecific air-fluid levels within the colon and small bowel without bowel wall thickening or inflammatory changes. This may reflect gastroenteritis. 2. No other evidence of an acute abnormality within the abdomen or pelvis. 3. Small to moderate right and minimal left pleural effusions. There is associated dependent lower lobe opacity the mostly on the right. This likely atelectasis. Infiltrate is possible. 4. Multiple chronic findings as detailed above.   Electronically Signed   By: Amie Portland M.D.   On: 01/18/2014 15:43   Dg Chest 2 View  01/17/2014   CLINICAL DATA:  Diffuse chest pain and shortness of breath on exertion. Nausea and vomiting. History of smoking and asthma.  EXAM: CHEST  2 VIEW  COMPARISON:  DG CHEST 2 VIEW dated 05/05/2013; CT ANGIO CHEST W/CM &/OR WO/CM dated 07/13/2010  FINDINGS: The lungs are well-aerated. A small right pleural effusion is noted. Mild medial right basilar airspace opacity may reflect atelectasis or pneumonia. There is no evidence of pleural effusion or pneumothorax.  The heart is enlarged. An AICD is seen at the left chest wall, with a single lead ending at the right ventricle. No acute osseous abnormalities are seen. Cervical spinal fusion hardware is noted.  IMPRESSION: 1. Small right pleural effusion noted. Mild medial right basilar airspace opacity may reflect atelectasis or pneumonia. 2. Cardiomegaly noted.   Electronically Signed   By: Roanna Raider M.D.   On: 01/17/2014 22:19   US Abdomen Complete  01/14/2014   CLINICAL DATA:  Acute right upper quadrant pain  EXAM: ULTRASOUND ABDOMEN COMPLETE  COMPARISON:  None.  FINDINGS: Gallbladder:  No gallstones, gallbladder wall thickening, or pericholecystic fluid. Negative sonographic Murphy's sign.  Common bile duct:  Diameter: 5 mm.  Liver:  No focal lesion identified. Within normal limits in parenchymal echogenicity.  IVC:  No abnormality visualized.   Pancreas:  Not visualized due to overlying bowel gas.  Spleen:  Measures 6.3 cm.  Right Kidney:  Length: 11.7 cm. Lobular renal contour, chronic. No mass or hydronephrosis.  Left Kidney:  Length: 12.3 cm. Lobular renal contour, chronic. No mass or hydronephrosis.  Abdominal aorta:  No aneurysm visualized.  Atherosclerosis.  Other findings:  None.  IMPRESSION: Negative abdominal ultrasound.   Electronically Signed   By: Charline Bills M.D.   On: 01/14/2014 10:26   US Renal  01/20/2014   CLINICAL DATA:  History of hypertension and diabetes.  EXAM: RENAL/URINARY TRACT ULTRASOUND COMPLETE  COMPARISON:  Noncontrast CT scan of the abdomen and pelvis dated 18 January 2014.  FINDINGS: Right Kidney:  Length: 11.8 cm  the echotexture of the renal cortex on the right is approximately equal to that of the adjacent liver. There is no focal mass nor hydronephrosis.  Left Kidney:  Length: 11.7 cm. There is no focal mass within the left kidney. The echotexture of the cortex is subjectively mildly increased. There is no hydronephrosis  Bladder:  The partially distended urinary bladder is normal in appearance.  IMPRESSION: The echotexture both kidneys is mildly increased consistent with medical renal disease. There is no significant renal atrophy. There is no hydronephrosis.   Electronically Signed   By: David  [place]   On: 01/20/2014 10:00   US Abdomen Limited Ruq  01/18/2014   CLINICAL DATA:  Elevated LFTs; epigastric abdominal pain.  EXAM: US ABDOMEN LIMITED - RIGHT UPPER QUADRANT  COMPARISON:  None.  FINDINGS: Gallbladder  No gallstones or wall thickening visualized. The gallbladder wall is borderline normal in thickness. No sonographic Murphy sign noted.  Common bile duct  Diameter: 0.4 cm, within normal limits in caliber  Liver:  No focal lesion identified. Within normal limits in parenchymal echogenicity.  Trace ascites is noted superior to the left hepatic lobe and inferior to the right hepatic lobe.  IMPRESSION: 1.  Gallbladder unremarkable in appearance. 2. Trace ascites incidentally noted about the liver, of uncertain significance.   Electronically Signed   By: Roanna Raider M.D.   On: 01/18/2014 00:49    Microbiology: No results found for this or any previous visit (from the past 240 hour(s)).   Labs: Basic Metabolic Panel:  CMET on 1/23 Sodium 140, potassium 4.3, chloride 101, CO2 21, BUN 77, creatinine 2.2, calcium 8.1, INR 2.8 AST 1345, AST 1213, alkaline phosphatase 196, total bilirubin 2.4   Recent Labs Lab 01/17/14 2212 01/18/14 0340  01/18/14 2130 01/19/14 0350 01/20/14 0625 01/21/14 0025 01/21/14 0810  NA 143  --   < > 141 139 139 138 137  K 4.4  --   < > 5.3 5.3 5.1 5.6* 5.8*  CL 116*  --   < > 104 101 102 101 100  CO2 14*  --   < > 21 20 15* 16* 13*  GLUCOSE 71  --   < > 135* 107* 85 110* 72  BUN 30*  --   < > 50* 52* 62* 72* 75*  CREATININE 0.87  --   < > 1.73* 1.85* 2.22* 2.62* 2.53*  CALCIUM 5.7*  --   < > 9.1 8.8 8.8 8.4 8.4  MG  --  2.0  --   --   --   --   --   --   PHOS  --  5.3*  --   --   --   --   --   --   < > = values in this interval not displayed. Liver Function Tests:  Recent Labs Lab 01/18/14 0638 01/19/14 0350 01/20/14 1330 01/21/14 0025 01/21/14 0810  AST 383* 776* 1008* 1245* 1340*  ALT 360* 642* 912* 1042* 1136*  ALKPHOS 184* 192* 246* 205* 214*  BILITOT 2.8* 3.1* 3.5* 3.1* 3.5*  PROT 6.8 6.3 6.9 6.5 6.6  ALBUMIN 3.6 3.4* 3.6 3.5 3.5    Recent Labs Lab 01/17/14 2026  LIPASE 36    Recent Labs Lab 01/21/14 1425  AMMONIA 48   CBC:  Recent Labs Lab 01/17/14 2026 01/18/14 0638 01/19/14 0350 01/20/14 0625  WBC 5.8 6.7 5.7 7.8  NEUTROABS 3.7  --   --   --   HGB 12.8 12.6  12.6 14.0  HCT 37.1 36.9 37.5 40.9  MCV 67.9* 68.0* 68.6* 69.1*  PLT 273 206 185 159   Cardiac Enzymes: No results found for this basename: CKTOTAL, CKMB, CKMBINDEX, TROPONINI,  in the last 168 hours BNP: BNP (last 3 results)  Recent Labs   05/07/13 0630 08/19/13 1024 09/07/13 0955  PROBNP 800.6* 1769.0* 40.0   CBG:  Recent Labs Lab 01/21/14 0735 01/21/14 0815 01/21/14 1125 01/21/14 1226 01/21/14 1632  GLUCAP 66* 74 130* 127* 183*       Signed:  Selah Klang  [facility] 01/21/2014, 6:19 PM

## 2014-01-21 NOTE — Progress Notes (Signed)
Per Dr. Gonzella Lex, patient to be accepted at [facility] by Dr Roselyn Meier.  [facility] does not have a bed available at this time, but will notify staff when one does become available.   Andrea [name]

## 2014-01-21 NOTE — Progress Notes (Signed)
[facility] Progress Note  Subjective: Somnolent, not feeling as well today, complaining of some abdominal pain and had headache  Objective: Vital signs in last 24 hours: Temp:  [97.2 F (36.2 C)-97.9 F (36.6 C)] 97.2 F (36.2 C) (01/22 0429) Pulse Rate:  [78-88] 80 (01/22 0429) Resp:  [18] 18 (01/22 0429) BP: (107-128)/(75-88) 114/84 mmHg (01/22 0429) SpO2:  [97 %-100 %] 100 % (01/22 0429) Weight:  [91.5 kg (201 lb 11.5 oz)] 91.5 kg (201 lb 11.5 oz) (01/22 0429) Weight change: 2.2 kg (4 lb 13.6 oz)   PE: Lethargic but arousable and appropriate, no asterixis. Abdomen is soft and nontender  Lab Results: Results for orders placed during the hospital encounter of 01/17/14 (from the past 24 hour(s))  GLUCOSE, CAPILLARY     Status: None   Collection Time    01/20/14  7:24 AM      Result Value Range   Glucose-Capillary 81  70 - 99 mg/dL  GLUCOSE, CAPILLARY     Status: Abnormal   Collection Time    01/20/14 11:37 AM      Result Value Range   Glucose-Capillary 65 (*) 70 - 99 mg/dL  HEPATIC FUNCTION PANEL     Status: Abnormal   Collection Time    01/20/14  1:30 PM      Result Value Range   Total Protein 6.9  6.0 - 8.3 g/dL   Albumin 3.6  3.5 - 5.2 g/dL   AST 3810 (*) 0 - 37 U/L   ALT 912 (*) 0 - 35 U/L   Alkaline Phosphatase 246 (*) 39 - 117 U/L   Total Bilirubin 3.5 (*) 0.3 - 1.2 mg/dL   Bilirubin, Direct 2.3 (*) 0.0 - 0.3 mg/dL   Indirect Bilirubin 1.2 (*) 0.3 - 0.9 mg/dL  GLUCOSE, CAPILLARY     Status: Abnormal   Collection Time    01/20/14  2:09 PM      Result Value Range   Glucose-Capillary 128 (*) 70 - 99 mg/dL  URINALYSIS, ROUTINE W REFLEX MICROSCOPIC     Status: Abnormal   Collection Time    01/20/14  4:07 PM      Result Value Range   Color, Urine AMBER (*) YELLOW   APPearance CLEAR  CLEAR   Specific Gravity, Urine 1.022  1.005 - 1.030   pH 5.5  5.0 - 8.0   Glucose, UA NEGATIVE  NEGATIVE mg/dL   Hgb urine dipstick NEGATIVE  NEGATIVE   Bilirubin  Urine SMALL (*) NEGATIVE   Ketones, ur NEGATIVE  NEGATIVE mg/dL   Protein, ur 175 (*) NEGATIVE mg/dL   Urobilinogen, UA 1.0  0.0 - 1.0 mg/dL   Nitrite NEGATIVE  NEGATIVE   Leukocytes, UA NEGATIVE  NEGATIVE  URINALYSIS, ROUTINE W REFLEX MICROSCOPIC     Status: Abnormal   Collection Time    01/20/14  4:07 PM      Result Value Range   Color, Urine AMBER (*) YELLOW   APPearance CLEAR  CLEAR   Specific Gravity, Urine 1.022  1.005 - 1.030   pH 5.5  5.0 - 8.0   Glucose, UA NEGATIVE  NEGATIVE mg/dL   Hgb urine dipstick NEGATIVE  NEGATIVE   Bilirubin Urine SMALL (*) NEGATIVE   Ketones, ur NEGATIVE  NEGATIVE mg/dL   Protein, ur 102 (*) NEGATIVE mg/dL   Urobilinogen, UA 1.0  0.0 - 1.0 mg/dL   Nitrite NEGATIVE  NEGATIVE   Leukocytes, UA NEGATIVE  NEGATIVE  SODIUM, URINE, RANDOM  Status: None   Collection Time    01/20/14  4:07 PM      Result Value Range   Sodium, Ur 20    URINE MICROSCOPIC-ADD ON     Status: Abnormal   Collection Time    01/20/14  4:07 PM      Result Value Range   Squamous Epithelial / LPF RARE  RARE   WBC, UA 0-2  <3 WBC/hpf   Bacteria, UA FEW (*) RARE   Casts HYALINE CASTS (*) NEGATIVE   Urine-Other AMORPHOUS URATES/PHOSPHATES    URINE MICROSCOPIC-ADD ON     Status: Abnormal   Collection Time    01/20/14  4:07 PM      Result Value Range   WBC, UA 0-2  <3 WBC/hpf   Bacteria, UA FEW (*) RARE   Casts HYALINE CASTS (*) NEGATIVE   Urine-Other AMORPHOUS URATES/PHOSPHATES    GLUCOSE, CAPILLARY     Status: Abnormal   Collection Time    01/20/14  4:58 PM      Result Value Range   Glucose-Capillary 154 (*) 70 - 99 mg/dL  GLUCOSE, CAPILLARY     Status: Abnormal   Collection Time    01/20/14  8:55 PM      Result Value Range   Glucose-Capillary 141 (*) 70 - 99 mg/dL   Comment 1 Notify RN    GLUCOSE, CAPILLARY     Status: Abnormal   Collection Time    01/21/14 12:22 AM      Result Value Range   Glucose-Capillary 113 (*) 70 - 99 mg/dL   Comment 1 Notify RN      Comment 2 Documented in Chart    COMPREHENSIVE METABOLIC PANEL     Status: Abnormal   Collection Time    01/21/14 12:25 AM      Result Value Range   Sodium 138  137 - 147 mEq/L   Potassium 5.6 (*) 3.7 - 5.3 mEq/L   Chloride 101  96 - 112 mEq/L   CO2 16 (*) 19 - 32 mEq/L   Glucose, Bld 110 (*) 70 - 99 mg/dL   BUN 72 (*) 6 - 23 mg/dL   Creatinine, Ser 1.612.62 (*) 0.50 - 1.10 mg/dL   Calcium 8.4  8.4 - 09.610.5 mg/dL   Total Protein 6.5  6.0 - 8.3 g/dL   Albumin 3.5  3.5 - 5.2 g/dL   AST [id number] (*) 0 - 37 U/L   ALT 1042 (*) 0 - 35 U/L   Alkaline Phosphatase 205 (*) 39 - 117 U/L   Total Bilirubin 3.1 (*) 0.3 - 1.2 mg/dL   GFR calc non Af Amer 18 (*) >90 mL/min   GFR calc Af Amer 21 (*) >90 mL/min  GLUCOSE, CAPILLARY     Status: None   Collection Time    01/21/14  4:27 AM      Result Value Range   Glucose-Capillary 74  70 - 99 mg/dL    Studies/Results: [place] Renal  01/20/2014   CLINICAL DATA:  History of hypertension and diabetes.  EXAM: RENAL/URINARY TRACT ULTRASOUND COMPLETE  COMPARISON:  Noncontrast CT scan of the abdomen and pelvis dated 18 January 2014.  FINDINGS: Right Kidney:  Length: 11.8 cm the echotexture of the renal cortex on the right is approximately equal to that of the adjacent liver. There is no focal mass nor hydronephrosis.  Left Kidney:  Length: 11.7 cm. There is no focal mass within the left kidney. The echotexture of the cortex is  subjectively mildly increased. There is no hydronephrosis  Bladder:  The partially distended urinary bladder is normal in appearance.  IMPRESSION: The echotexture both kidneys is mildly increased consistent with medical renal disease. There is no significant renal atrophy. There is no hydronephrosis.   Electronically Signed   By: David  [place]   On: 01/20/2014 10:00      Assessment: 1. Rising liver function tests with imaging studies showing no acute hepatobiliary process suggesting in hepatocellular insults some kind, unclear from studies to date.  Autoimmune markers pending. Iron studies negative and hepatitis studies negative. 2. Developing renal insufficiency with no hydronephrosis or other abnormality except for mildly increased echotexture on ultrasound  Plan: 1. Await repeat liver function tests, coag studies, and ammonia level. 2. If coag studies rise and renal function test worsen,  liver function tests do not improve or frank encephalopathy develops I would recommend referral to tertiary care center with transplant capability, otherwise close monitoring of the above parameters and expectant management.    Crystalynn Mcinerney C 01/21/2014, 7:12 AM

## 2014-01-21 NOTE — Care Management Note (Addendum)
  Page 2 of 2   01/25/2014     12:48:17 PM   CARE MANAGEMENT NOTE 01/25/2014  Patient:  Andrea [name], Andrea [name]   Account Number:  [id number]  Date Initiated:  01/21/2014  Documentation initiated by:  Angelique Chevalier  Subjective/Objective Assessment:   Nausea/ vomiting diarrhea poor PO intake     Action/Plan:   CM will follow for disposition needs.   Anticipated DC Date:  01/24/2014   Anticipated DC Plan:  HOME/SELF CARE      DC Planning Services  CM consult      Choice offered to / List presented to:             Status of service:  Completed, signed off Medicare Important Message given?   (If response is "NO", the following Medicare IM given date fields will be blank) Date Medicare IM given:   Date Additional Medicare IM given:    Discharge Disposition:    Per UR Regulation:  Reviewed for med. necessity/level of care/duration of stay  If discussed at Long Length of Stay Meetings, dates discussed:    Comments:  01/25/2014 Patient d/c to [facility] Provider updated. [address] RN, BSN, [place], CCM ([phone] 01/25/2014   01/22/2014 Possible transfer to [facility] for evaluation of liver transplant. Hx/o severe  cardiomyopathy. Hx/o Per Dr. Gonzella Lex, patient to be accepted at [facility] by Dr Roselyn Meier. [facility] does not have a bed available at this time. ADD: +0-3 pending transfer [employer] RN, BSN, [place], CCM [phone] Unit) [phone] 01/22/2014  01/21/2014 Last UR 01/19/2014 Dehydration, hypocalcimia, DM, HBP, Failure to thrive, elevated LFTs Social: From home with "partner"  [facility]). Independent with all ADLs. Transportation:  provided by partner HHS:  None and denies any previous HHS Home DME:  [facility] PCP:  Dr. Lurena Joiner Dispostion Plan:  Pending ADD:  [address] RN, BSN, [place], CCM [phone] Unit) [phone] 01/21/2014

## 2014-01-22 ENCOUNTER — Inpatient Hospital Stay (HOSPITAL_COMMUNITY): Payer: BC Managed Care – PPO

## 2014-01-22 ENCOUNTER — Encounter: Payer: BC Managed Care – PPO | Admitting: Internal Medicine

## 2014-01-22 DIAGNOSIS — N289 Disorder of kidney and ureter, unspecified: Secondary | ICD-10-CM

## 2014-01-22 LAB — PROTIME-INR
INR: 3.8 — ABNORMAL HIGH (ref 0.00–1.49)
Prothrombin Time: 36 seconds — ABNORMAL HIGH (ref 11.6–15.2)

## 2014-01-22 LAB — COMPREHENSIVE METABOLIC PANEL
ALBUMIN: 3.2 g/dL — AB (ref 3.5–5.2)
ALT: 1213 U/L — ABNORMAL HIGH (ref 0–35)
AST: 1345 U/L — ABNORMAL HIGH (ref 0–37)
Alkaline Phosphatase: 196 U/L — ABNORMAL HIGH (ref 39–117)
BILIRUBIN TOTAL: 3.4 mg/dL — AB (ref 0.3–1.2)
BUN: 77 mg/dL — ABNORMAL HIGH (ref 6–23)
CO2: 21 mEq/L (ref 19–32)
CREATININE: 2.2 mg/dL — AB (ref 0.50–1.10)
Calcium: 8.1 mg/dL — ABNORMAL LOW (ref 8.4–10.5)
Chloride: 101 mEq/L (ref 96–112)
GFR calc Af Amer: 26 mL/min — ABNORMAL LOW (ref >90)
GFR calc non Af Amer: 23 mL/min — ABNORMAL LOW (ref >90)
Glucose, Bld: 106 mg/dL — ABNORMAL HIGH (ref 70–99)
Potassium: 4.3 mEq/L (ref 3.7–5.3)
Sodium: 140 mEq/L (ref 137–147)
Total Protein: 6.1 g/dL (ref 6.0–8.3)

## 2014-01-22 LAB — GLUCOSE, CAPILLARY
GLUCOSE-CAPILLARY: 143 mg/dL — AB (ref 70–99)
GLUCOSE-CAPILLARY: 146 mg/dL — AB (ref 70–99)
GLUCOSE-CAPILLARY: 166 mg/dL — AB (ref 70–99)
Glucose-Capillary: 116 mg/dL — ABNORMAL HIGH (ref 70–99)
Glucose-Capillary: 118 mg/dL — ABNORMAL HIGH (ref 70–99)
Glucose-Capillary: 144 mg/dL — ABNORMAL HIGH (ref 70–99)

## 2014-01-22 LAB — CMV IGM: CMV IGM: 11.1 [AU]/ml (ref <30.00)

## 2014-01-22 MED ORDER — SODIUM CHLORIDE 0.9 % IV SOLN
INTRAVENOUS | Status: DC
  Administered 2014-01-22: 10:00:00 via INTRAVENOUS

## 2014-01-22 MED ORDER — SODIUM BICARBONATE 650 MG PO TABS
1300.0000 mg | ORAL_TABLET | Freq: Two times a day (BID) | ORAL | Status: DC
  Filled 2014-01-22 (×2): qty 2

## 2014-01-22 MED ORDER — PHYTONADIONE 5 MG PO TABS
5.0000 mg | ORAL_TABLET | Freq: Once | ORAL | Status: AC
  Administered 2014-01-22: 5 mg via ORAL
  Filled 2014-01-22: qty 1

## 2014-01-22 MED ORDER — BOOST / RESOURCE BREEZE PO LIQD
1.0000 | Freq: Two times a day (BID) | ORAL | Status: DC
  Administered 2014-01-22: 1 via ORAL

## 2014-01-22 NOTE — Progress Notes (Signed)
1700 Dr. Gonzella Lex  awre of > LFT's , >pt/inr levels today . With order .Carried out  Vit k 5mg s ordered and given. Heparin s/c d'cd

## 2014-01-22 NOTE — Progress Notes (Signed)
1400 telephone report given to [facility]transport center [facility]

## 2014-01-22 NOTE — Progress Notes (Signed)
[facility] Progress Note  Subjective: Patient is lethargic appears somewhat encephalopathic denies any specific complaints.  Objective: Vital signs in last 24 hours: Temp:  [97.4 F (36.3 C)-97.6 F (36.4 C)] 97.6 F (36.4 C) (01/23 0500) Pulse Rate:  [85-87] 87 (01/23 0500) Resp:  [16-20] 16 (01/23 1032) BP: (117-128)/(76-84) 128/84 mmHg (01/23 1032) SpO2:  [97 %-100 %] 97 % (01/23 1032) Weight:  [92.2 kg (203 lb 4.2 oz)-93 kg (205 lb 0.4 oz)] 92.2 kg (203 lb 4.2 oz) (01/23 0625) Weight change: 1.5 kg (3 lb 4.9 oz)   PE: No definite icterus no asterixis, abdomen soft  Lab Results: Results for orders placed during the hospital encounter of 01/17/14 (from the past 24 hour(s))  GLUCOSE, CAPILLARY     Status: Abnormal   Collection Time    01/21/14 11:25 AM      Result Value Range   Glucose-Capillary 130 (*) 70 - 99 mg/dL  GLUCOSE, CAPILLARY     Status: Abnormal   Collection Time    01/21/14 12:26 PM      Result Value Range   Glucose-Capillary 127 (*) 70 - 99 mg/dL  ACETAMINOPHEN LEVEL     Status: None   Collection Time    01/21/14 12:59 PM      Result Value Range   Acetaminophen (Tylenol), Serum <15.0  10 - 30 ug/mL  PROTIME-INR     Status: Abnormal   Collection Time    01/21/14  2:23 PM      Result Value Range   Prothrombin Time 44.0 (*) 11.6 - 15.2 seconds   INR 4.94 (*) 0.00 - 1.49  AMMONIA     Status: None   Collection Time    01/21/14  2:25 PM      Result Value Range   Ammonia 48  11 - 60 umol/L  GLUCOSE, CAPILLARY     Status: Abnormal   Collection Time    01/21/14  4:32 PM      Result Value Range   Glucose-Capillary 183 (*) 70 - 99 mg/dL  GLUCOSE, CAPILLARY     Status: Abnormal   Collection Time    01/21/14  8:18 PM      Result Value Range   Glucose-Capillary 148 (*) 70 - 99 mg/dL   Comment 1 Documented in Chart     Comment 2 Notify RN    GLUCOSE, CAPILLARY     Status: Abnormal   Collection Time    01/22/14 12:23 AM      Result Value  Range   Glucose-Capillary 144 (*) 70 - 99 mg/dL  GLUCOSE, CAPILLARY     Status: Abnormal   Collection Time    01/22/14  4:14 AM      Result Value Range   Glucose-Capillary 116 (*) 70 - 99 mg/dL  COMPREHENSIVE METABOLIC PANEL     Status: Abnormal   Collection Time    01/22/14  5:10 AM      Result Value Range   Sodium 140  137 - 147 mEq/L   Potassium 4.3  3.7 - 5.3 mEq/L   Chloride 101  96 - 112 mEq/L   CO2 21  19 - 32 mEq/L   Glucose, Bld 106 (*) 70 - 99 mg/dL   BUN 77 (*) 6 - 23 mg/dL   Creatinine, Ser 9.522.20 (*) 0.50 - 1.10 mg/dL   Calcium 8.1 (*) 8.4 - 10.5 mg/dL   Total Protein 6.1  6.0 - 8.3 g/dL   Albumin 3.2 (*) 3.5 -  5.2 g/dL   AST 3086 (*) 0 - 37 U/L   ALT 1213 (*) 0 - 35 U/L   Alkaline Phosphatase 196 (*) 39 - 117 U/L   Total Bilirubin 3.4 (*) 0.3 - 1.2 mg/dL   GFR calc non Af Amer 23 (*) >90 mL/min   GFR calc Af Amer 26 (*) >90 mL/min  PROTIME-INR     Status: Abnormal   Collection Time    01/22/14  6:33 AM      Result Value Range   Prothrombin Time 36.0 (*) 11.6 - 15.2 seconds   INR 3.80 (*) 0.00 - 1.49  GLUCOSE, CAPILLARY     Status: Abnormal   Collection Time    01/22/14  6:57 AM      Result Value Range   Glucose-Capillary 118 (*) 70 - 99 mg/dL    Studies/Results: No results found.    Assessment: Worsening liver function with significant risk of impending fulminant hepatic failure with elevated INR, clinical encephalopathy, rising LFTs unclear etiology. Acetaminophen level normal as expected.  Plan: Arrangements have been made to transfer to [facility] for further management of possible fulminant hepatic failure. Fortunately transaminases seem to have peaked and bilirubin and INR are slightly elevated after vitamin K given yesterday. Doppler ultrasound ordered a liver to rule out a Budd-Chiari-type lesion. Will follows long is in the hospital.  Tyrrell Stephens C 01/22/2014, 10:55 AM

## 2014-01-22 NOTE — Progress Notes (Signed)
[facility] Progress Note   Subjective:   Patient very somnolent this morning. Appears ill and encephalopathic as she is not answer questions appropriately.    Objective:   BP 126/82  Pulse 87  Temp(Src) 97.6 F (36.4 C) (Oral)  Resp 20  Ht 5' (1.524 m)  Wt 203 lb 4.2 oz (92.2 kg)  BMI 39.70 kg/m2  SpO2 99%  Intake/Output Summary (Last 24 hours) at 01/22/14 [id number] Last data filed at 01/22/14 [id number]  Gross per 24 hour  Intake 1979.98 ml  Output    525 ml  Net 1454.98 ml   Weight change: 3 lb 4.9 oz (1.5 kg)  Physical Exam: General: sitting up at the bedside, appears ill; no acute distress.  HEENT: No scleral icterus currently. Cardiovascular: RRR. 2/6 Systolic murmur noted.  Respiratory: Bibasilar rales noted.  Abdomen: soft, nontender, mildly  distended abdomen. Given body habitus no organomegaly was appreciated.  Extremities: No LE edema.  Neuro: Somnolent and lethargic.  AO to person.   Imaging: [place] Renal  01/20/2014   CLINICAL DATA:  History of hypertension and diabetes.  EXAM: RENAL/URINARY TRACT ULTRASOUND COMPLETE  COMPARISON:  Noncontrast CT scan of the abdomen and pelvis dated 18 January 2014.  FINDINGS: Right Kidney:  Length: 11.8 cm the echotexture of the renal cortex on the right is approximately equal to that of the adjacent liver. There is no focal mass nor hydronephrosis.  Left Kidney:  Length: 11.7 cm. There is no focal mass within the left kidney. The echotexture of the cortex is subjectively mildly increased. There is no hydronephrosis  Bladder:  The partially distended urinary bladder is normal in appearance.  IMPRESSION: The echotexture both kidneys is mildly increased consistent with medical renal disease. There is no significant renal atrophy. There is no hydronephrosis.   Electronically Signed   By: David  [place]   On: 01/20/2014 10:00    Labs: BMET  Recent Labs Lab 01/17/14 2212 01/18/14 0340  01/18/14 1217 01/18/14 2130  01/19/14 0350 01/20/14 0625 01/21/14 0025 01/21/14 0810 01/22/14 0510  NA 143  --   < > 139 141 139 139 138 137 140  K 4.4  --   < > 6.0* 5.3 5.3 5.1 5.6* 5.8* 4.3  CL 116*  --   < > 102 104 101 102 101 100 101  CO2 14*  --   < > 22 21 20  15* 16* 13* 21  GLUCOSE 71  --   < > 88 135* 107* 85 110* 72 106*  BUN 30*  --   < > 48* 50* 52* 62* 72* 75* 77*  CREATININE 0.87  --   < > 1.66* 1.73* 1.85* 2.22* 2.62* 2.53* 2.20*  CALCIUM 5.7*  --   < > 9.3 9.1 8.8 8.8 8.4 8.4 8.1*  PHOS  --  5.3*  --   --   --   --   --   --   --   --   < > = values in this interval not displayed. CBC  Recent Labs Lab 01/17/14 2026 01/18/14 0638 01/19/14 0350 01/20/14 0625  WBC 5.8 6.7 5.7 7.8  NEUTROABS 3.7  --   --   --   HGB 12.8 12.6 12.6 14.0  HCT 37.1 36.9 37.5 40.9  MCV 67.9* 68.0* 68.6* 69.1*  PLT 273 206 185 159    Medications:    . bisoprolol  5 mg Oral Daily  . docusate sodium  100 mg Oral BID  .  folic acid  1 mg Oral Daily  . heparin subcutaneous  5,000 Units Subcutaneous Q8H  . insulin aspart  0-9 Units Subcutaneous Q4H  . pantoprazole  40 mg Oral Daily  . sodium chloride  3 mL Intravenous Q12H  . sodium chloride  3 mL Intravenous Q12H  . thiamine  100 mg Oral Daily    Assessment/ Plan:   Patient is a 63 y.o. female with a complex PMHx including HTN, CAD, Ischemic cardiomyopathy s/p AICD, CHF, DM-2, and prior CVA who was admitted to [facility] on 01/17/2014 with nausea, vomiting and poor PO intake.   1) Nausea/vomiting/decreased PO intake and Transaminitis - GI now following. Work up negative thus far: Hep A, B, C negative, US unremarkable, CT abdomen (see above) did not reveal underlying GB or other pathology, Lipase normal.  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 INR 4.94.  - Workup: Hep A, B, C negative, US unremarkable, CT abdomen (see above) did not reveal underlying GB or other pathology, Lipase normal.  - Autoimmune workup: Anti smooth muscle antibody, ANA, Anti mitochondrial antibody all negative. - GI following.  - IM has spoken with [facility] for transfer.  Patient waiting bed.   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 Cr , acid base and k better with isotonic bicarb. - Urine sodium 20, signifying intravascular depletion.  Renal injury secondary to acute liver failure. - Iron studies done; patient with low iron and % sat. Will hold off on treatment until acute illness improves/resolves.  - Acid base status markedly improved following administration of IV Sodium Bicarb.  Will switch to PO Bicarb 1300 mg BID.  - Creatinine/GFR slightly improved this am.  - Will continue to monitor closely.  Prognosis is guarded given liver failure.    3) HTN  - Continue home Bisoprolol  - Will continue to monitor closely; currently well controlled.    Tommie Sams, DO PGY-2, [facility] 01/22/2014, 7:02 AM I have seen and examined this patient and agree with the plan of care seen, examined, eval, and discussed. .  Kuba Shepherd L 01/22/2014, 12:00 PM

## 2014-02-23 ENCOUNTER — Ambulatory Visit: Payer: BC Managed Care – PPO | Admitting: Cardiology

## 2014-03-08 ENCOUNTER — Encounter (HOSPITAL_COMMUNITY): Payer: Self-pay | Admitting: Emergency Medicine

## 2014-03-08 ENCOUNTER — Emergency Department (HOSPITAL_COMMUNITY): Payer: BC Managed Care – PPO

## 2014-03-08 ENCOUNTER — Inpatient Hospital Stay (HOSPITAL_COMMUNITY)
Admission: EM | Admit: 2014-03-08 | Discharge: 2014-03-24 | DRG: 291 | Disposition: A | Discharge status: Home Health | Payer: BC Managed Care – PPO | Attending: Internal Medicine | Admitting: Internal Medicine

## 2014-03-08 DIAGNOSIS — T502X5A Adverse effect of carbonic-anhydrase inhibitors, benzothiadiazides and other diuretics, initial encounter: Secondary | ICD-10-CM | POA: Diagnosis present

## 2014-03-08 DIAGNOSIS — Z8673 Personal history of transient ischemic attack (TIA), and cerebral infarction without residual deficits: Secondary | ICD-10-CM

## 2014-03-08 DIAGNOSIS — K769 Liver disease, unspecified: Secondary | ICD-10-CM | POA: Diagnosis present

## 2014-03-08 DIAGNOSIS — E1169 Type 2 diabetes mellitus with other specified complication: Secondary | ICD-10-CM | POA: Diagnosis present

## 2014-03-08 DIAGNOSIS — N184 Chronic kidney disease, stage 4 (severe): Secondary | ICD-10-CM | POA: Diagnosis present

## 2014-03-08 DIAGNOSIS — K7200 Acute and subacute hepatic failure without coma: Secondary | ICD-10-CM | POA: Diagnosis present

## 2014-03-08 DIAGNOSIS — J449 Chronic obstructive pulmonary disease, unspecified: Secondary | ICD-10-CM | POA: Diagnosis present

## 2014-03-08 DIAGNOSIS — K219 Gastro-esophageal reflux disease without esophagitis: Secondary | ICD-10-CM | POA: Diagnosis present

## 2014-03-08 DIAGNOSIS — I509 Heart failure, unspecified: Secondary | ICD-10-CM

## 2014-03-08 DIAGNOSIS — I429 Cardiomyopathy, unspecified: Secondary | ICD-10-CM

## 2014-03-08 DIAGNOSIS — R0989 Other specified symptoms and signs involving the circulatory and respiratory systems: Secondary | ICD-10-CM

## 2014-03-08 DIAGNOSIS — M1990 Unspecified osteoarthritis, unspecified site: Secondary | ICD-10-CM | POA: Diagnosis present

## 2014-03-08 DIAGNOSIS — I5021 Acute systolic (congestive) heart failure: Secondary | ICD-10-CM

## 2014-03-08 DIAGNOSIS — IMO0002 Reserved for concepts with insufficient information to code with codable children: Secondary | ICD-10-CM

## 2014-03-08 DIAGNOSIS — J4489 Other specified chronic obstructive pulmonary disease: Secondary | ICD-10-CM | POA: Diagnosis present

## 2014-03-08 DIAGNOSIS — Z8249 Family history of ischemic heart disease and other diseases of the circulatory system: Secondary | ICD-10-CM

## 2014-03-08 DIAGNOSIS — J9601 Acute respiratory failure with hypoxia: Secondary | ICD-10-CM

## 2014-03-08 DIAGNOSIS — Z79899 Other long term (current) drug therapy: Secondary | ICD-10-CM

## 2014-03-08 DIAGNOSIS — N390 Urinary tract infection, site not specified: Secondary | ICD-10-CM | POA: Diagnosis present

## 2014-03-08 DIAGNOSIS — I2589 Other forms of chronic ischemic heart disease: Secondary | ICD-10-CM | POA: Diagnosis present

## 2014-03-08 DIAGNOSIS — R000 Tachycardia, unspecified: Secondary | ICD-10-CM | POA: Diagnosis present

## 2014-03-08 DIAGNOSIS — I129 Hypertensive chronic kidney disease with stage 1 through stage 4 chronic kidney disease, or unspecified chronic kidney disease: Secondary | ICD-10-CM | POA: Diagnosis present

## 2014-03-08 DIAGNOSIS — R0609 Other forms of dyspnea: Secondary | ICD-10-CM

## 2014-03-08 DIAGNOSIS — E669 Obesity, unspecified: Secondary | ICD-10-CM | POA: Diagnosis present

## 2014-03-08 DIAGNOSIS — N189 Chronic kidney disease, unspecified: Secondary | ICD-10-CM

## 2014-03-08 DIAGNOSIS — I420 Dilated cardiomyopathy: Secondary | ICD-10-CM

## 2014-03-08 DIAGNOSIS — M109 Gout, unspecified: Secondary | ICD-10-CM | POA: Diagnosis not present

## 2014-03-08 DIAGNOSIS — I259 Chronic ischemic heart disease, unspecified: Secondary | ICD-10-CM

## 2014-03-08 DIAGNOSIS — E876 Hypokalemia: Secondary | ICD-10-CM | POA: Diagnosis present

## 2014-03-08 DIAGNOSIS — F319 Bipolar disorder, unspecified: Secondary | ICD-10-CM | POA: Diagnosis present

## 2014-03-08 DIAGNOSIS — E119 Type 2 diabetes mellitus without complications: Secondary | ICD-10-CM | POA: Diagnosis present

## 2014-03-08 DIAGNOSIS — Z888 Allergy status to other drugs, medicaments and biological substances status: Secondary | ICD-10-CM

## 2014-03-08 DIAGNOSIS — J90 Pleural effusion, not elsewhere classified: Secondary | ICD-10-CM

## 2014-03-08 DIAGNOSIS — E785 Hyperlipidemia, unspecified: Secondary | ICD-10-CM

## 2014-03-08 DIAGNOSIS — Z6834 Body mass index (BMI) 34.0-34.9, adult: Secondary | ICD-10-CM

## 2014-03-08 DIAGNOSIS — E1129 Type 2 diabetes mellitus with other diabetic kidney complication: Secondary | ICD-10-CM | POA: Diagnosis present

## 2014-03-08 DIAGNOSIS — K7460 Unspecified cirrhosis of liver: Secondary | ICD-10-CM | POA: Diagnosis present

## 2014-03-08 DIAGNOSIS — I270 Primary pulmonary hypertension: Secondary | ICD-10-CM | POA: Diagnosis present

## 2014-03-08 DIAGNOSIS — F411 Generalized anxiety disorder: Secondary | ICD-10-CM | POA: Diagnosis present

## 2014-03-08 DIAGNOSIS — I428 Other cardiomyopathies: Secondary | ICD-10-CM

## 2014-03-08 DIAGNOSIS — N058 Unspecified nephritic syndrome with other morphologic changes: Secondary | ICD-10-CM | POA: Diagnosis present

## 2014-03-08 DIAGNOSIS — Z9861 Coronary angioplasty status: Secondary | ICD-10-CM

## 2014-03-08 DIAGNOSIS — N289 Disorder of kidney and ureter, unspecified: Secondary | ICD-10-CM

## 2014-03-08 DIAGNOSIS — E860 Dehydration: Secondary | ICD-10-CM

## 2014-03-08 DIAGNOSIS — I498 Other specified cardiac arrhythmias: Secondary | ICD-10-CM | POA: Diagnosis present

## 2014-03-08 DIAGNOSIS — I2510 Atherosclerotic heart disease of native coronary artery without angina pectoris: Secondary | ICD-10-CM

## 2014-03-08 DIAGNOSIS — R112 Nausea with vomiting, unspecified: Secondary | ICD-10-CM

## 2014-03-08 DIAGNOSIS — I252 Old myocardial infarction: Secondary | ICD-10-CM

## 2014-03-08 DIAGNOSIS — Z87891 Personal history of nicotine dependence: Secondary | ICD-10-CM

## 2014-03-08 DIAGNOSIS — I5022 Chronic systolic (congestive) heart failure: Secondary | ICD-10-CM

## 2014-03-08 DIAGNOSIS — N179 Acute kidney failure, unspecified: Secondary | ICD-10-CM

## 2014-03-08 DIAGNOSIS — I5023 Acute on chronic systolic (congestive) heart failure: Secondary | ICD-10-CM

## 2014-03-08 DIAGNOSIS — N185 Chronic kidney disease, stage 5: Secondary | ICD-10-CM

## 2014-03-08 DIAGNOSIS — N2581 Secondary hyperparathyroidism of renal origin: Secondary | ICD-10-CM | POA: Diagnosis present

## 2014-03-08 DIAGNOSIS — M25519 Pain in unspecified shoulder: Secondary | ICD-10-CM | POA: Diagnosis present

## 2014-03-08 DIAGNOSIS — R0600 Dyspnea, unspecified: Secondary | ICD-10-CM

## 2014-03-08 DIAGNOSIS — Z95810 Presence of automatic (implantable) cardiac defibrillator: Secondary | ICD-10-CM

## 2014-03-08 DIAGNOSIS — M1710 Unilateral primary osteoarthritis, unspecified knee: Secondary | ICD-10-CM | POA: Diagnosis present

## 2014-03-08 LAB — URINE MICROSCOPIC-ADD ON

## 2014-03-08 LAB — CBC WITH DIFFERENTIAL/PLATELET
BASOS ABS: 0.1 10*3/uL (ref 0.0–0.1)
Basophils Relative: 1 % (ref 0–1)
EOS ABS: 0.1 10*3/uL (ref 0.0–0.7)
Eosinophils Relative: 1 % (ref 0–5)
HEMATOCRIT: 37.3 % (ref 36.0–46.0)
Hemoglobin: 12.4 g/dL (ref 12.0–15.0)
LYMPHS ABS: 2 10*3/uL (ref 0.7–4.0)
Lymphocytes Relative: 33 % (ref 12–46)
MCH: 22.8 pg — ABNORMAL LOW (ref 26.0–34.0)
MCHC: 33.2 g/dL (ref 30.0–36.0)
MCV: 68.6 fL — AB (ref 78.0–100.0)
Monocytes Absolute: 0.6 10*3/uL (ref 0.1–1.0)
Monocytes Relative: 10 % (ref 3–12)
Neutro Abs: 3.3 10*3/uL (ref 1.7–7.7)
Neutrophils Relative %: 55 % (ref 43–77)
PLATELETS: 273 10*3/uL (ref 150–400)
RBC: 5.44 MIL/uL — ABNORMAL HIGH (ref 3.87–5.11)
RDW: 21 % — AB (ref 11.5–15.5)
WBC: 6.1 10*3/uL (ref 4.0–10.5)

## 2014-03-08 LAB — I-STAT CG4 LACTIC ACID, ED: LACTIC ACID, VENOUS: 1.94 mmol/L (ref 0.5–2.2)

## 2014-03-08 LAB — COMPREHENSIVE METABOLIC PANEL
ALBUMIN: 3.3 g/dL — AB (ref 3.5–5.2)
ALT: 16 U/L (ref 0–35)
AST: 25 U/L (ref 0–37)
Alkaline Phosphatase: 90 U/L (ref 39–117)
BILIRUBIN TOTAL: 1.5 mg/dL — AB (ref 0.3–1.2)
BUN: 25 mg/dL — ABNORMAL HIGH (ref 6–23)
CHLORIDE: 98 meq/L (ref 96–112)
CO2: 24 mEq/L (ref 19–32)
CREATININE: 1.35 mg/dL — AB (ref 0.50–1.10)
Calcium: 9.6 mg/dL (ref 8.4–10.5)
GFR calc Af Amer: 48 mL/min — ABNORMAL LOW (ref >90)
GFR calc non Af Amer: 41 mL/min — ABNORMAL LOW (ref >90)
Glucose, Bld: 169 mg/dL — ABNORMAL HIGH (ref 70–99)
Potassium: 4.3 mEq/L (ref 3.7–5.3)
Sodium: 139 mEq/L (ref 137–147)
TOTAL PROTEIN: 8.1 g/dL (ref 6.0–8.3)

## 2014-03-08 LAB — URINALYSIS, ROUTINE W REFLEX MICROSCOPIC
Glucose, UA: NEGATIVE mg/dL
Hgb urine dipstick: NEGATIVE
Ketones, ur: 15 mg/dL — AB
NITRITE: POSITIVE — AB
Protein, ur: >300 mg/dL — AB
SPECIFIC GRAVITY, URINE: 1.023 (ref 1.005–1.030)
Urobilinogen, UA: 1 mg/dL (ref 0.0–1.0)
pH: 5 (ref 5.0–8.0)

## 2014-03-08 LAB — AMMONIA: Ammonia: 21 umol/L (ref 11–60)

## 2014-03-08 LAB — ACETAMINOPHEN LEVEL

## 2014-03-08 LAB — PROTIME-INR
INR: 1.3 (ref 0.00–1.49)
PROTHROMBIN TIME: 15.9 s — AB (ref 11.6–15.2)

## 2014-03-08 LAB — TROPONIN I

## 2014-03-08 LAB — CBG MONITORING, ED: Glucose-Capillary: 157 mg/dL — ABNORMAL HIGH (ref 70–99)

## 2014-03-08 LAB — LIPASE, BLOOD: Lipase: 25 U/L (ref 11–59)

## 2014-03-08 LAB — PRO B NATRIURETIC PEPTIDE: Pro B Natriuretic peptide (BNP): 2706 pg/mL — ABNORMAL HIGH (ref 0–125)

## 2014-03-08 LAB — GLUCOSE, CAPILLARY: Glucose-Capillary: 126 mg/dL — ABNORMAL HIGH (ref 70–99)

## 2014-03-08 MED ORDER — ONDANSETRON HCL 4 MG/2ML IJ SOLN
4.0000 mg | Freq: Once | INTRAMUSCULAR | Status: AC
  Filled 2014-03-08: qty 2

## 2014-03-08 MED ORDER — SODIUM CHLORIDE 0.9 % IJ SOLN
3.0000 mL | INTRAMUSCULAR | Status: DC | PRN
Start: 2014-03-08 — End: 2014-03-24

## 2014-03-08 MED ORDER — ONDANSETRON HCL 4 MG/2ML IJ SOLN
4.0000 mg | Freq: Four times a day (QID) | INTRAMUSCULAR | Status: DC | PRN
  Administered 2014-03-08 – 2014-03-23 (×14): 4 mg via INTRAVENOUS
  Filled 2014-03-08 (×16): qty 2

## 2014-03-08 MED ORDER — SODIUM CHLORIDE 0.9 % IV BOLUS (SEPSIS): 500.0000 mL | Freq: Once | INTRAVENOUS | Status: DC

## 2014-03-08 MED ORDER — NITROGLYCERIN 0.4 MG SL SUBL: 0.4000 mg | SUBLINGUAL_TABLET | SUBLINGUAL | Status: DC | PRN

## 2014-03-08 MED ORDER — DIPHENHYDRAMINE HCL 25 MG PO CAPS
25.0000 mg | ORAL_CAPSULE | Freq: Three times a day (TID) | ORAL | Status: DC | PRN
  Administered 2014-03-08 – 2014-03-14 (×6): 25 mg via ORAL
  Filled 2014-03-08 (×6): qty 1

## 2014-03-08 MED ORDER — ONDANSETRON HCL 4 MG PO TABS
4.0000 mg | ORAL_TABLET | Freq: Four times a day (QID) | ORAL | Status: DC | PRN
  Administered 2014-03-15 – 2014-03-19 (×5): 4 mg via ORAL
  Filled 2014-03-08 (×6): qty 1

## 2014-03-08 MED ORDER — BISOPROLOL FUMARATE 5 MG PO TABS
5.0000 mg | ORAL_TABLET | Freq: Every day | ORAL | Status: DC
  Administered 2014-03-08 – 2014-03-10 (×3): 5 mg via ORAL
  Filled 2014-03-08 (×3): qty 1

## 2014-03-08 MED ORDER — PANTOPRAZOLE SODIUM 40 MG IV SOLR
40.0000 mg | Freq: Two times a day (BID) | INTRAVENOUS | Status: DC
  Administered 2014-03-08 – 2014-03-09 (×4): 40 mg via INTRAVENOUS
  Filled 2014-03-08 (×7): qty 40

## 2014-03-08 MED ORDER — SODIUM CHLORIDE 0.9 % IV SOLN
250.0000 mL | INTRAVENOUS | Status: DC | PRN
Start: 2014-03-08 — End: 2014-03-24

## 2014-03-08 MED ORDER — SODIUM CHLORIDE 0.9 % IJ SOLN
3.0000 mL | Freq: Two times a day (BID) | INTRAMUSCULAR | Status: DC
  Administered 2014-03-08 – 2014-03-17 (×7): 3 mL via INTRAVENOUS

## 2014-03-08 MED ORDER — LACTULOSE 10 GM/15ML PO SOLN
20.0000 g | Freq: Two times a day (BID) | ORAL | Status: DC | PRN
  Filled 2014-03-08: qty 30

## 2014-03-08 MED ORDER — METOPROLOL TARTRATE 12.5 MG HALF TABLET
12.5000 mg | ORAL_TABLET | Freq: Two times a day (BID) | ORAL | Status: DC
  Filled 2014-03-08 (×2): qty 1

## 2014-03-08 MED ORDER — DEXTROSE 5 % IV SOLN
1.0000 g | INTRAVENOUS | Status: DC
  Administered 2014-03-08: 1 g via INTRAVENOUS
  Filled 2014-03-08 (×2): qty 10

## 2014-03-08 MED ORDER — TRAMADOL HCL 50 MG PO TABS
50.0000 mg | ORAL_TABLET | Freq: Every day | ORAL | Status: DC | PRN
  Administered 2014-03-09 – 2014-03-15 (×5): 50 mg via ORAL
  Filled 2014-03-08 (×6): qty 1

## 2014-03-08 MED ORDER — ENOXAPARIN SODIUM 30 MG/0.3ML ~~LOC~~ SOLN
30.0000 mg | SUBCUTANEOUS | Status: DC
  Administered 2014-03-08 – 2014-03-23 (×16): 30 mg via SUBCUTANEOUS
  Filled 2014-03-08 (×18): qty 0.3

## 2014-03-08 MED ORDER — MELATONIN 3 MG PO TABS: 3.0000 mg | ORAL_TABLET | Freq: Every evening | ORAL | Status: DC | PRN

## 2014-03-08 MED ORDER — SODIUM CHLORIDE 0.9 % IJ SOLN
10.0000 mL | INTRAMUSCULAR | Status: DC | PRN
  Administered 2014-03-08 – 2014-03-22 (×15): 10 mL

## 2014-03-08 MED ORDER — ONDANSETRON HCL 4 MG/2ML IJ SOLN
8.0000 mg | Freq: Once | INTRAMUSCULAR | Status: AC
  Administered 2014-03-08: 8 mg via INTRAMUSCULAR

## 2014-03-08 MED ORDER — FUROSEMIDE 40 MG PO TABS
40.0000 mg | ORAL_TABLET | Freq: Every day | ORAL | Status: DC
  Administered 2014-03-08: 40 mg via ORAL
  Filled 2014-03-08 (×2): qty 1

## 2014-03-08 NOTE — Consult Note (Signed)
CARDIOLOGY CONSULT NOTE   Patient ID: Andrea [name] MRN: [id number] DOB/AGE: 06/10/1951 63 y.o.  Admit Date: 03/08/2014  Primary Physician: Lolita Patella, MD Primary Cardiologist  Peter Sw[place]and   Clinical Summary Andrea [name] is a 63 y.o.female. She has a very complex history. She is admitted today with nausea and vomiting and some shortness of breath.  The patient is known to Dr. [place]. He saw her last in the office November 19, 2013. 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 She has significant coronary disease status post stenting in the past. She is a nonresponder to Plavix. However she could not tolerate Brillinta because of wheezing and she cannot receive Effient because of a CVA history. She also has diabetes. She also does not tolerate ACE inhibitors or ARB's because of intolerance including hypotension and acute renal failure. She was clinically stable in the office in November, 2014.  In January she had a long admission here in [place]. However she had can he needed worsening liver failure. She was transferred to [facility] for consideration of liver transplant. I do not see any cardiology notes during that admission here in [place]. The patient returned from [place]. It is our understanding that she was not accepted for transplant. However there are no records available to me at this time. She came home greatly improved. We need to obtain information from [facility] to understand what was done that led to her improvement.  She is admitted now with nausea and vomiting and shortness of breath. She has some mild edema. Her chest x-ray does not show signs of CHF. Her renal function looks quite good for her. Also her liver functions look quite good.  Allergies  Allergen Reactions  . Asa [Aspirin] Shortness Of Breath  . Aspirin Shortness Of Breath and Other (See Comments)    Asthma symptoms  . Brilinta [Ticagrelor] Shortness Of Breath and Other (See Comments)      Gout   . Percocet [Oxycodone-Acetaminophen] Nausea And Vomiting  . Darvon Nausea And Vomiting  . Diovan [Valsartan] Other (See Comments)    unknown  . Imitrex [Sumatriptan Base] Other (See Comments)    Unknown reaction  . Licorice Flavor [Artificial Licorice Flavor]     Black licorice  . Nsaids Nausea And Vomiting  . Nsaids Other (See Comments)    GI upset     Medications Scheduled Medications: . cefTRIAXone (ROCEPHIN)  IV  1 g Intravenous Q24H  . enoxaparin (LOVENOX) injection  30 mg Subcutaneous Q24H  . furosemide  40 mg Oral Daily  . metoprolol tartrate  12.5 mg Oral BID  . pantoprazole (PROTONIX) IV  40 mg Intravenous Q12H  . sodium chloride  3 mL Intravenous Q12H     Infusions:     PRN Medications:  sodium chloride, diphenhydrAMINE, lactulose, nitroGLYCERIN, ondansetron (ZOFRAN) IV, ondansetron, sodium chloride, sodium chloride, traMADol   Past Medical History  Diagnosis Date  . Chronic systolic heart failure   . Hypertension   . Stroke 02/2010    left brain CVA? recurrent TIA's treated with TPA  . Dyslipidemia   . Premature ventricular contractions (PVCs) (VPCs)   . GERD (gastroesophageal reflux disease)   . Coronary artery disease     a. DES-dRCA 11/2010. b. DES-pRCA 04/2011 (in an area free of disease on prior cath). c. s/p DES-mRCA 03/2013 for NSTEMI.  . Ischemic cardiomyopathy     a. Hx of medtronic ICD. b. EF 15-20% by cath 03/2013.  . Pulmonary hypertension   .  Gout   . Obesity   . Thrombocytopenia   . Depression   . Hypotension     a. Admission 09/2012 for hypotn & ARF. b. Meds held 03/2013 due to hypotension.  . Acute renal insufficiency     a. 09/2012. b. Also noted post-cath 03/2013.  . NSTEMI (non-ST elevated myocardial infarction) 03/2013  . Anginal pain   . Asthma   . Microcytic anemia     a. Noted 03/2013 on labs, iron studies WNL.  Marland Kitchen Chronic lower back pain   . Anxiety   . CHF (congestive heart failure)   . CKD (chronic kidney  disease) stage 3, GFR 30-59 ml/min   . HTN (hypertension)   . Primary pulmonary HTN   . Automatic implantable cardiac defibrillator - Medtronic 08/24/2013  . Automatic implantable cardioverter-defibrillator in situ   . Heart murmur   . Pneumonia ~ 2001; 2014    "double; late last year" (01/18/2014)  . Shortness of breath     "can happen at anytime" (01/18/2014)  . Type II diabetes mellitus   . Migraine headache     "no pain; aura/visual problems only; at least 2-3X/month" (01/18/2014)  . Headaches, cluster     "monthly" (01/18/2014)  . Arthritis     "knees" (01/18/2014)  . DJD (degenerative joint disease)   . Bipolar disorder     Past Surgical History  Procedure Laterality Date  . Mass excision Left     hip  . Cervical polypectomy  1970's  . US echocardiography  2011  . Tonsillectomy  1960's?  . Right oophorectomy  1980's  . Dilation and curettage of uterus  1970's  . Ostectomy metatarsal Left 1993    "5th toe; from rickets" (05/07/2013)  . Implantable cardioverter defibrillator implant  ~ 2008  . Coronary angioplasty with stent placement  2000's    "2 stents" (05/07/2013)  . Coronary angioplasty with stent placement  03/2013    PCI to RCA/notes 05/05/2013; "makes total of 3" (05/07/2013)    Family History  Problem Relation Age of Onset  . Heart failure Mother   . Heart attack Father   . Heart disease Brother     Social History Andrea [name] reports that she quit smoking about 31 years ago. Her smoking use included Cigarettes. She has a .25 pack-year smoking history. She has never used smokeless tobacco. Andrea [name] reports that she does not drink alcohol.  Review of Systems  Patient denies fever, chills, headache, sweats, rash, change in vision, change in hearing, chest pain, cough, urinary symptoms. All other systems are reviewed and are negative.  Physical Examination Blood pressure 111/79, pulse 112, temperature 98 F (36.7 C), temperature source Oral, resp. rate 20,  height 5' (1.524 m), weight 174 lb 13.2 oz (79.3 kg), SpO2 98.00%.  Intake/Output Summary (Last 24 hours) at 03/08/14 1852 Last data filed at 03/08/14 1831  Gross per 24 hour  Intake    120 ml  Output    200 ml  Net    -80 ml   Patient is overweight. She is oriented to person time and place. Affect is normal. I do not hear any significant rales. There is no respiratory distress. Cardiac exam reveals S1 and S2. There is sinus tachycardia area there is a systolic murmur. The abdomen is protuberant but soft. There is 1+ peripheral edema. This may be chronic. There no musculoskeletal deformities. There are no skin rashes.  Prior Cardiac Testing/Procedures  Lab Results  Basic Metabolic Panel:  Recent  Labs Lab 03/08/14 0759  NA 139  K 4.3  CL 98  CO2 24  GLUCOSE 169*  BUN 25*  CREATININE 1.35*  CALCIUM 9.6    Liver Function Tests:  Recent Labs Lab 03/08/14 0759  AST 25  ALT 16  ALKPHOS 90  BILITOT 1.5*  PROT 8.1  ALBUMIN 3.3*    CBC:  Recent Labs Lab 03/08/14 0759  WBC 6.1  NEUTROABS 3.3  HGB 12.4  HCT 37.3  MCV 68.6*  PLT 273    Cardiac Enzymes:  Recent Labs Lab 03/08/14 0759  TROPONINI <0.30    BNP: No components found with this basename: POCBNP,    Radiology: Dg Chest 2 View  03/08/2014   CLINICAL DATA:  Shortness of breath.  EXAM: CHEST  2 VIEW  COMPARISON:  CT ABD/PELV WO CM dated 01/18/2014; DG CHEST 2 VIEW dated 01/17/2014  FINDINGS: AICD noted with lead tip projected over right ventricle.Cardiomegaly with normal pulmonary vascularity. No focal infiltrate. No pleural effusion or pneumothorax. No acute osseous abnormality. Prior cervical spine fusion.  IMPRESSION: 1. Stable severe cardiomegaly. AICD noted in stable position. No CHF. 2. Interim clearing of small right pleural effusion.   Electronically Signed   By: Maisie Fushomas  Register   On: 03/08/2014 08:23   EKG:  EKG reveals sinus tachycardia. There are nonspecific ST changes.  Telemetry:    Telemetry reveals sinus tachycardia with a rate in the range of 110-115.   Impression and Recommendations    Coronary artery disease     The patient has significant coronary disease. Her presentation today does not suggest an acute coronary syndrome.    Dilated cardiomyopathy     Patient has severe dilated cardiomyopathy. There is an ICD in place. She had previously been on bisoprolol. She does have resting sinus tachycardia. Low dose metoprolol had been started today. I will change her to bisoprolol.    Dyspnea     Etiology of dyspnea this morning is not completely clear. I am not convinced that there is marked volume overload. I think observation during the evening is the most appropriate approach to this issue      Chronic systolic CHF (congestive heart failure)      There is chronic systolic CHF. This seems to be controlled with a modest dose of diuretics.    Nausea and vomiting     Etiology is not clear. Of course there is immediate concern that this may be a repeat problem with her liver. She had severe liver failure and was transferred to [facility]. Transplant was not done and she returned home remarkably better. We need records to understand what was done there.    AICD (automatic cardioverter/defibrillator) present         Sinus tachycardia     Slowing her heart rate will hopefully improve her overall status. TSH will be checked.  I am very hesitant to make any major changes in her meds at this time. She is dramatically better than her situation that was described [facility]. We need to obtain this information.  Jerral BonitoJeff Derrious Bologna, MD  03/08/2014, 6:52 PM

## 2014-03-08 NOTE — ED Notes (Signed)
IV Team was unsuccessful. Called and left patient's room.

## 2014-03-08 NOTE — ED Notes (Signed)
Patient returned from Xray. IV notified and verbalized understanding. Coming down to start access.

## 2014-03-08 NOTE — ED Notes (Signed)
RN notified CBG [phone]

## 2014-03-08 NOTE — ED Notes (Signed)
Resident unsuccessful to put in [place] IV. IV team paged and returned call. Dr. Manus Gunning at bedside looking at [place].

## 2014-03-08 NOTE — Progress Notes (Signed)
PHARMACIST - PHYSICIAN ORDER COMMUNICATION  CONCERNING: P&T Medication Policy on Herbal Medications  DESCRIPTION:  This patient's order for: Melatonin has been noted.  This product(s) is classified as an "herbal" or natural product. Due to a lack of definitive safety studies or FDA approval, nonstandard manufacturing practices, plus the potential risk of unknown drug-drug interactions while on inpatient medications, the Pharmacy and Therapeutics Committee does not permit the use of "herbal" or natural products of this type within [facility].   ACTION TAKEN: The pharmacy department is unable to verify this order at this time and your patient has been informed of this safety policy. Please reevaluate patient's clinical condition at discharge and address if the herbal or natural product(s) should be resumed at that time.  Link Snuffer, PharmD, BCPS Clinical Pharmacist [phone] 03/08/2014

## 2014-03-08 NOTE — ED Notes (Signed)
Admitting Physician at the bedside.  

## 2014-03-08 NOTE — ED Notes (Addendum)
IV Team unable to access [place] IV. IV Nurse calling PICC Line Team. Dr. Manus Gunning at the bedside.

## 2014-03-08 NOTE — ED Notes (Signed)
PICC Line team at the bedside.

## 2014-03-08 NOTE — ED Notes (Signed)
Pt to department via EMS- pt reports that she has been increasingly SOB over the past week. Reports that she has also had some n/v. Pt has hx of gout and reports bilateral foot pain from the same. Out of gout medication at this time. Bp-122/79 Hr-100 Pt placed on O2 for support

## 2014-03-08 NOTE — ED Notes (Signed)
Unsuccessfully attempted to start IV.

## 2014-03-08 NOTE — ED Notes (Signed)
MD notified about IV unsuccess,

## 2014-03-08 NOTE — Progress Notes (Signed)
Peripherally Inserted Central Catheter/Midline Placement  The IV Nurse has discussed with the patient and/or persons authorized to consent for the patient, the purpose of this procedure and the potential benefits and risks involved with this procedure.  The benefits include less needle sticks, lab draws from the catheter and patient may be discharged home with the catheter.  Risks include, but not limited to, infection, bleeding, blood clot (thrombus formation), and puncture of an artery; nerve damage and irregular heat beat.  Alternatives to this procedure were also discussed.  PICC/Midline Placement Documentation  PICC / Midline Single Lumen 01/20/14 Midline Right Brachial 15 cm 0 cm (Active)       Franne Grip Renee 03/08/2014, 10:53 AM

## 2014-03-08 NOTE — H&P (Signed)
[facility] History and Physical  Andrea HeadingsCorinne G [name] ZOX:[id number] DOB: 05-23-1951 DOA: 03/08/2014  Referring physician: Dr Manus Gunningancour.  PCP: Lolita PatellaEADE,ROBERT ALEXANDER, MD   Chief Complaint: Vomiting , SOB.   HPI: Andrea HeadingsCorinne G [name] is a 63 y.o. female with PMH liver failure, Ischemic Cardiomyopathy EF 20 %, CKD stage III, who presents to ED complaining of nausea, vomiting (food content) that started morning of admission. She also presents with dyspnea that wake her up this morning. She relates that she is breathing better now. She denies chest pain, abdominal pain, diarrhea. She has 3 to 4 BM when she takes lactulose. She is having trouble remembering things since she was diagnosed with liver problems.   Of note during last admission [facility] and consideration for transplant. It is not clear cause of liver diseases, but thought to be related to heart failure. She was not a candidate for liver transplant.   Review of Systems:    Past Medical History  Diagnosis Date  . Chronic systolic heart failure   . Hypertension   . Stroke 02/2010    left brain CVA? recurrent TIA's treated with TPA  . Dyslipidemia   . Premature ventricular contractions (PVCs) (VPCs)   . GERD (gastroesophageal reflux disease)   . Coronary artery disease     a. DES-dRCA 11/2010. b. DES-pRCA 04/2011 (in an area free of disease on prior cath). c. s/p DES-mRCA 03/2013 for NSTEMI.  . Ischemic cardiomyopathy     a. Hx of medtronic ICD. b. EF 15-20% by cath 03/2013.  . Pulmonary hypertension   . Gout   . Obesity   . Thrombocytopenia   . Depression   . Hypotension     a. Admission 09/2012 for hypotn & ARF. b. Meds held 03/2013 due to hypotension.  . Acute renal insufficiency     a. 09/2012. b. Also noted post-cath 03/2013.  . NSTEMI (non-ST elevated myocardial infarction) 03/2013  . Anginal pain   . Asthma   . Microcytic anemia     a. Noted 03/2013 on labs, iron  studies WNL.  Marland Kitchen. Chronic lower back pain   . Anxiety   . CHF (congestive heart failure)   . CKD (chronic kidney disease) stage 3, GFR 30-59 ml/min   . HTN (hypertension)   . Primary pulmonary HTN   . Automatic implantable cardiac defibrillator - Medtronic 08/24/2013  . Automatic implantable cardioverter-defibrillator in situ   . Heart murmur   . Pneumonia ~ 2001; 2014    "double; late last year" (01/18/2014)  . Shortness of breath     "can happen at anytime" (01/18/2014)  . Type II diabetes mellitus   . Migraine headache     "no pain; aura/visual problems only; at least 2-3X/month" (01/18/2014)  . Headaches, cluster     "monthly" (01/18/2014)  . Arthritis     "knees" (01/18/2014)  . DJD (degenerative joint disease)   . Bipolar disorder    Past Surgical History  Procedure Laterality Date  . Mass excision Left     hip  . Cervical polypectomy  1970's  . [place] echocardiography  2011  . Tonsillectomy  1960's?  . Right oophorectomy  1980's  . Dilation and curettage of uterus  1970's  . Ostectomy metatarsal Left 1993    "5th toe; from rickets" (05/07/2013)  . Implantable cardioverter defibrillator implant  ~ 2008  . Coronary angioplasty with stent placement  2000's    "2 stents" (05/07/2013)  .  Coronary angioplasty with stent placement  03/2013    PCI to RCA/notes 05/05/2013; "makes total of 3" (05/07/2013)   Social History:  reports that she quit smoking about 31 years ago. Her smoking use included Cigarettes. She has a .25 pack-year smoking history. She has never used smokeless tobacco. She reports that she does not drink alcohol or use illicit drugs.  Allergies  Allergen Reactions  . Asa [Aspirin] Shortness Of Breath  . Aspirin Shortness Of Breath and Other (See Comments)    Asthma symptoms  . Brilinta [Ticagrelor] Shortness Of Breath and Other (See Comments)    Gout   . Percocet [Oxycodone-Acetaminophen] Nausea And Vomiting  . Darvon Nausea And Vomiting  . Diovan [Valsartan] Other (See  Comments)    unknown  . Imitrex [Sumatriptan Base] Other (See Comments)    Unknown reaction  . Licorice Flavor [Artificial Licorice Flavor]     Black licorice  . Nsaids Nausea And Vomiting  . Nsaids Other (See Comments)    GI upset     Family History  Problem Relation Age of Onset  . Heart failure Mother   . Heart attack Father   . Heart disease Brother      Prior to Admission medications   Medication Sig Start Date End Date Taking? Authorizing Provider  lactulose (CHRONULAC) 10 GM/15ML solution Take 20 g by mouth 2 (two) times daily as needed for mild constipation.  02/03/14  Yes Historical Provider, MD  Melatonin 3 MG TABS Take 3 mg by mouth at bedtime as needed (for sleep).  02/03/14  Yes Historical Provider, MD  metoprolol tartrate (LOPRESSOR) 25 MG tablet Take 12.5 mg by mouth 2 (two) times daily.  02/03/14 02/03/15 Yes Historical Provider, MD  nitroGLYCERIN (NITROSTAT) 0.4 MG SL tablet Place 0.4 mg under the tongue every 5 (five) minutes as needed for chest pain.   Yes Historical Provider, MD  omeprazole (PRILOSEC) 20 MG capsule Take 20 mg by mouth daily.  02/24/14  Yes Historical Provider, MD  traMADol (ULTRAM) 50 MG tablet Take 50 mg by mouth daily as needed for moderate pain.   Yes Historical Provider, MD   Physical Exam: Filed Vitals:   03/08/14 1145  BP: 108/70  Pulse: 103  Temp:   Resp: 21    BP 108/70  Pulse 103  Temp(Src) 98.3 F (36.8 C) (Oral)  Resp 21  SpO2 99%  General:  Appears calm and comfortable Eyes: PERRL, normal lids, irises & conjunctiva ENT: grossly normal hearing, lips & tongue Neck: no LAD, masses or thyromegaly. Positive JVD.  Cardiovascular: RRR, no m/r/g. No LE edema. Telemetry: SR, no arrhythmias  Respiratory: Bilateral fine crackles. Normal respiratory effort. Abdomen: soft, ntnd Skin: no rash or induration seen on limited exam Musculoskeletal: grossly normal tone BUE/BLE, trace edema.  Neurologic: grossly non-focal. Memory loss.            Labs on Admission:  Basic Metabolic Panel:  Recent Labs Lab 03/08/14 0759  NA 139  K 4.3  CL 98  CO2 24  GLUCOSE 169*  BUN 25*  CREATININE 1.35*  CALCIUM 9.6   Liver Function Tests:  Recent Labs Lab 03/08/14 0759  AST 25  ALT 16  ALKPHOS 90  BILITOT 1.5*  PROT 8.1  ALBUMIN 3.3*    Recent Labs Lab 03/08/14 0759  LIPASE 25    Recent Labs Lab 03/08/14 0759  AMMONIA 21   CBC:  Recent Labs Lab 03/08/14 0759  WBC 6.1  NEUTROABS 3.3  HGB 12.4  HCT 37.3  MCV 68.6*  PLT 273   Cardiac Enzymes:  Recent Labs Lab 03/08/14 0759  TROPONINI <0.30    BNP (last 3 results)  Recent Labs  08/19/13 1024 09/07/13 0955 03/08/14 0759  PROBNP 1769.0* 40.0 2706.0*   CBG:  Recent Labs Lab 03/08/14 0750  GLUCAP 157*    Radiological Exams on Admission: Dg Chest 2 View  03/08/2014   CLINICAL DATA:  Shortness of breath.  EXAM: CHEST  2 VIEW  COMPARISON:  CT ABD/PELV WO CM dated 01/18/2014; DG CHEST 2 VIEW dated 01/17/2014  FINDINGS: AICD noted with lead tip projected over right ventricle.Cardiomegaly with normal pulmonary vascularity. No focal infiltrate. No pleural effusion or pneumothorax. No acute osseous abnormality. Prior cervical spine fusion.  IMPRESSION: 1. Stable severe cardiomegaly. AICD noted in stable position. No CHF. 2. Interim clearing of small right pleural effusion.   Electronically Signed   By: Maisie Fus  Register   On: 03/08/2014 08:23    EKG: Independently reviewed. Sinus tachycardia, PVC.   Assessment/Plan Active Problems:   Dyspnea   Heart failure   1- Nausea, Vomiting; could be related to gastroenteritis,  UTI. -LFT, lipase normal. She denies abdominal pain.  -Will start protonix.  -If persist will need further evaluation.  -Will start clear diet. Would avoid IV fluids due to history of heart failure.   2-Dyspnea; improved. Suspect underline Heart failure. BNP elevated at 2706. Chest x ray stable Cardiomegaly. Will cycle cardiac  enzymes. I will resumue home dose lasix. Cardiology consulted.   3-Tachycardia; resume metoprolol. No significant hypotension.  4-UTI: UA with positive nitrates. I will start empirically ceftriaxone. Urine culture.  5-Liver failure; stable at this time. I will continue with lactulose.  6-DVT prophylaixs; Lovenox.  7-CKD stage 3; Cr better than during last hospitalization. Monitor on lasix.   Code Status: Full code.  Family Communication: Care discussed with significant others.  Disposition Plan: Expect 2 to 3 days inpatient.   Time spent: 75 minutes.   Blima Rich [facility] Pager [phone]

## 2014-03-08 NOTE — ED Notes (Signed)
EDP unable to gain IV access. Second IV team nurse at the bedside. Talked with IV team about placement of PICC line if no access obtained.

## 2014-03-08 NOTE — ED Notes (Signed)
Resident at the bedside attempting to put in [place] IV.

## 2014-03-08 NOTE — ED Provider Notes (Signed)
CSN: [id number]     Arrival date & time 03/08/14  0729 History   First MD Initiated Contact with Patient 03/08/14 0730     Chief Complaint  Patient presents with  . Shortness of Breath  . Nausea     (Consider location/radiation/quality/duration/timing/severity/associated sxs/prior Treatment) HPI Comments: Patient presents from home with shortness of breath and generalized weakness that started last night. She endorses gradually worsening shortness of breath over the past one week. She's had a cough productive of clear mucus. She denies any chest pain. This morning she developed some upper abdominal pain with nausea and vomiting is nonbloody and nonbilious. She is similar presentation in January and was admitted to the hospital. She states compliance with her medications. She denies any fevers. She has a history of ischemic cardiomyopathy with EF of 20%, diabetes, COPD, CAD with stents and liver disease.  The history is provided by the patient and the EMS personnel.    Past Medical History  Diagnosis Date  . Chronic systolic heart failure   . Hypertension   . Stroke 02/2010    left brain CVA? recurrent TIA's treated with TPA  . Dyslipidemia   . Premature ventricular contractions (PVCs) (VPCs)   . GERD (gastroesophageal reflux disease)   . Coronary artery disease     a. DES-dRCA 11/2010. b. DES-pRCA 04/2011 (in an area free of disease on prior cath). c. s/p DES-mRCA 03/2013 for NSTEMI.  . Ischemic cardiomyopathy     a. Hx of medtronic ICD. b. EF 15-20% by cath 03/2013.  . Pulmonary hypertension   . Gout   . Obesity   . Thrombocytopenia   . Depression   . Hypotension     a. Admission 09/2012 for hypotn & ARF. b. Meds held 03/2013 due to hypotension.  . Acute renal insufficiency     a. 09/2012. b. Also noted post-cath 03/2013.  . NSTEMI (non-ST elevated myocardial infarction) 03/2013  . Anginal pain   . Asthma   . Microcytic anemia     a. Noted 03/2013 on labs, iron studies WNL.  Marland Kitchen  Chronic lower back pain   . Anxiety   . CHF (congestive heart failure)   . CKD (chronic kidney disease) stage 3, GFR 30-59 ml/min   . HTN (hypertension)   . Primary pulmonary HTN   . Automatic implantable cardiac defibrillator - Medtronic 08/24/2013  . Automatic implantable cardioverter-defibrillator in situ   . Heart murmur   . Pneumonia ~ 2001; 2014    "double; late last year" (01/18/2014)  . Shortness of breath     "can happen at anytime" (01/18/2014)  . Type II diabetes mellitus   . Migraine headache     "no pain; aura/visual problems only; at least 2-3X/month" (01/18/2014)  . Headaches, cluster     "monthly" (01/18/2014)  . Arthritis     "knees" (01/18/2014)  . DJD (degenerative joint disease)   . Bipolar disorder    Past Surgical History  Procedure Laterality Date  . Mass excision Left     hip  . Cervical polypectomy  1970's  . US echocardiography  2011  . Tonsillectomy  1960's?  . Right oophorectomy  1980's  . Dilation and curettage of uterus  1970's  . Ostectomy metatarsal Left 1993    "5th toe; from rickets" (05/07/2013)  . Implantable cardioverter defibrillator implant  ~ 2008  . Coronary angioplasty with stent placement  2000's    "2 stents" (05/07/2013)  . Coronary angioplasty with stent placement  03/2013  PCI to RCA/notes 05/05/2013; "makes total of 3" (05/07/2013)   Family History  Problem Relation Age of Onset  . Heart failure Mother   . Heart attack Father   . Heart disease Brother    History  Substance Use Topics  . Smoking status: Former Smoker -- 0.05 packs/day for 5 years    Types: Cigarettes    Quit date: 04/23/1982  . Smokeless tobacco: Never Used  . Alcohol Use: No   OB History   Grav Para Term Preterm Abortions TAB SAB Ect Mult Living                 Review of Systems  Constitutional: Positive for activity change, appetite change and fatigue. Negative for fever.  HENT: Negative for congestion and rhinorrhea.   Respiratory: Positive for cough  and shortness of breath.   Gastrointestinal: Positive for nausea, vomiting and abdominal pain. Negative for diarrhea and constipation.  Genitourinary: Negative for dysuria and hematuria.  Musculoskeletal: Positive for arthralgias and myalgias.  Neurological: Positive for weakness.  A complete 10 system review of systems was obtained and all systems are negative except as noted in the HPI and PMH.      Allergies  Asa; Aspirin; Brilinta; Percocet; Darvon; Diovan; Imitrex; Licorice flavor; Nsaids; and Nsaids  Home Medications   Current Outpatient Rx  Name  Route  Sig  Dispense  Refill  . lactulose (CHRONULAC) 10 GM/15ML solution   Oral   Take 20 g by mouth 2 (two) times daily as needed for mild constipation.          . Melatonin 3 MG TABS   Oral   Take 3 mg by mouth at bedtime as needed (for sleep).          . metoprolol tartrate (LOPRESSOR) 25 MG tablet   Oral   Take 12.5 mg by mouth 2 (two) times daily.          . nitroGLYCERIN (NITROSTAT) 0.4 MG SL tablet   Sublingual   Place 0.4 mg under the tongue every 5 (five) minutes as needed for chest pain.         Marland Kitchen. omeprazole (PRILOSEC) 20 MG capsule   Oral   Take 20 mg by mouth daily.          . traMADol (ULTRAM) 50 MG tablet   Oral   Take 50 mg by mouth daily as needed for moderate pain.          BP 108/70  Pulse 103  Temp(Src) 98.3 F (36.8 C) (Oral)  Resp 21  SpO2 99% Physical Exam  Constitutional: She appears well-developed and well-nourished.  HENT:  Head: Normocephalic and atraumatic.  Mouth/Throat: No oropharyngeal exudate.  Dry mucus membranes  Eyes: Conjunctivae and EOM are normal. Pupils are equal, round, and reactive to light.  Neck: Normal range of motion. Neck supple. JVD present.  Cardiovascular: Normal rate and normal heart sounds.   tachycardic  Pulmonary/Chest: Effort normal and breath sounds normal.  Abdominal: Soft. There is no tenderness. There is no rebound and no guarding.  Upper  abdominal tenderness, no peritoneal signs  Musculoskeletal: Normal range of motion. She exhibits edema. She exhibits no tenderness.  Trace pedal edema  Neurological: She is alert. No cranial nerve deficit. She exhibits normal muscle tone.  Skin: Skin is warm.    ED Course  Procedures (including critical care time) Labs Review Labs Reviewed  CBC WITH DIFFERENTIAL - Abnormal; Notable for the following:    RBC 5.44 (*)  MCV 68.6 (*)    MCH 22.8 (*)    RDW 21.0 (*)    All other components within normal limits  COMPREHENSIVE METABOLIC PANEL - Abnormal; Notable for the following:    Glucose, Bld 169 (*)    BUN 25 (*)    Creatinine, Ser 1.35 (*)    Albumin 3.3 (*)    Total Bilirubin 1.5 (*)    GFR calc non Af Amer 41 (*)    GFR calc Af Amer 48 (*)    All other components within normal limits  PROTIME-INR - Abnormal; Notable for the following:    Prothrombin Time 15.9 (*)    All other components within normal limits  PRO B NATRIURETIC PEPTIDE - Abnormal; Notable for the following:    Pro B Natriuretic peptide (BNP) 2706.0 (*)    All other components within normal limits  CBG MONITORING, ED - Abnormal; Notable for the following:    Glucose-Capillary 157 (*)    All other components within normal limits  TROPONIN I  LIPASE, BLOOD  AMMONIA  ACETAMINOPHEN LEVEL  URINALYSIS, ROUTINE W REFLEX MICROSCOPIC  I-STAT CG4 LACTIC ACID, ED   Imaging Review Dg Chest 2 View  03/08/2014   CLINICAL DATA:  Shortness of breath.  EXAM: CHEST  2 VIEW  COMPARISON:  CT ABD/PELV WO CM dated 01/18/2014; DG CHEST 2 VIEW dated 01/17/2014  FINDINGS: AICD noted with lead tip projected over right ventricle.Cardiomegaly with normal pulmonary vascularity. No focal infiltrate. No pleural effusion or pneumothorax. No acute osseous abnormality. Prior cervical spine fusion.  IMPRESSION: 1. Stable severe cardiomegaly. AICD noted in stable position. No CHF. 2. Interim clearing of small right pleural effusion.    Electronically Signed   By: Maisie Fus  Register   On: 03/08/2014 08:23     EKG Interpretation   Date/Time:  Monday March 08 2014 07:48:16 EDT Ventricular Rate:  106 PR Interval:  148 QRS Duration: 112 QT Interval:  396 QTC Calculation: 526 R Axis:   -7 Text Interpretation:  Sinus tachycardia Multiple ventricular premature  complexes Consider right atrial enlargement Probable LVH with secondary  repol abnrm Prolonged QT interval No significant change was found  Confirmed by Manus Gunning  MD, Yola Paradiso ([id number]) on 03/08/2014 8:02:05 AM      MDM   Final diagnoses:  Dyspnea  Nausea and vomiting   Shortness of breath with nausea and vomiting, no chest pain. History of chronic systolic heart failure with CKD and hepatic disease.  LFTs improved from previous. EKG has improved from previous. Patient is orthostatic with heart rate elevation to 120s with standing.  [facility] records reviewed from January. Patient's cause of liver disease was not diagnosed. Her LFTs and mental status improved with lactulose and rifaximin. She was not considered a transplant candidate.  In improved from baseline. He is normal. Troponin negative. Patient remains tachycardic in sinus rhythm with frequent PVCs. Her chest x-ray does not show any pulmonary edema. BNP elevated 2700. Patient does have JVD t to angle of jaw. She appears to be slightly hypovolemic. Holding fluids at this time at request of hospitalist. Cardiology to consult. Suspect dyspnea in setting of CHF, appears back to baseline now. D/w Dr. Sunnie Nielsen.   Angiocath insertion Performed by: Glynn Octave  Consent: Verbal consent obtained. Risks and benefits: risks, benefits and alternatives were discussed Time out: Immediately prior to procedure a "time out" was called to verify the correct patient, procedure, equipment, support staff and site/side marked as required.  Preparation: Patient was prepped and  draped in the usual sterile fashion.  Vein  Location: L antecubital   YEs Ultrasound Guided  Gauge: 20  Normal blood return and flush without difficulty Patient tolerance: Patient tolerated the procedure well with no immediate complications.     Glynn Octave, MD 03/08/14 [phone]

## 2014-03-08 NOTE — ED Notes (Signed)
Lab at the bedside. Pt to x-ray after lab work.

## 2014-03-09 DIAGNOSIS — N189 Chronic kidney disease, unspecified: Secondary | ICD-10-CM

## 2014-03-09 DIAGNOSIS — E860 Dehydration: Secondary | ICD-10-CM

## 2014-03-09 DIAGNOSIS — N289 Disorder of kidney and ureter, unspecified: Secondary | ICD-10-CM

## 2014-03-09 DIAGNOSIS — N179 Acute kidney failure, unspecified: Secondary | ICD-10-CM | POA: Diagnosis not present

## 2014-03-09 LAB — COMPREHENSIVE METABOLIC PANEL
ALK PHOS: 81 U/L (ref 39–117)
ALT: 16 U/L (ref 0–35)
AST: 28 U/L (ref 0–37)
Albumin: 3.1 g/dL — ABNORMAL LOW (ref 3.5–5.2)
BUN: 33 mg/dL — ABNORMAL HIGH (ref 6–23)
CO2: 20 meq/L (ref 19–32)
Calcium: 9.6 mg/dL (ref 8.4–10.5)
Chloride: 95 mEq/L — ABNORMAL LOW (ref 96–112)
Creatinine, Ser: 2.32 mg/dL — ABNORMAL HIGH (ref 0.50–1.10)
GFR, EST AFRICAN AMERICAN: 25 mL/min — AB (ref >90)
GFR, EST NON AFRICAN AMERICAN: 21 mL/min — AB (ref >90)
GLUCOSE: 101 mg/dL — AB (ref 70–99)
Potassium: 5.2 mEq/L (ref 3.7–5.3)
Sodium: 137 mEq/L (ref 137–147)
TOTAL PROTEIN: 7.5 g/dL (ref 6.0–8.3)
Total Bilirubin: 2.1 mg/dL — ABNORMAL HIGH (ref 0.3–1.2)

## 2014-03-09 LAB — TROPONIN I: Troponin I: <0.3 ng/mL (ref <0.30)

## 2014-03-09 LAB — CBC
HCT: 35.8 % — ABNORMAL LOW (ref 36.0–46.0)
HEMOGLOBIN: 12 g/dL (ref 12.0–15.0)
MCH: 22.9 pg — ABNORMAL LOW (ref 26.0–34.0)
MCHC: 33.5 g/dL (ref 30.0–36.0)
MCV: 68.2 fL — ABNORMAL LOW (ref 78.0–100.0)
Platelets: 280 10*3/uL (ref 150–400)
RBC: 5.25 MIL/uL — ABNORMAL HIGH (ref 3.87–5.11)
RDW: 21.2 % — ABNORMAL HIGH (ref 11.5–15.5)
WBC: 6.3 10*3/uL (ref 4.0–10.5)

## 2014-03-09 LAB — GLUCOSE, CAPILLARY
GLUCOSE-CAPILLARY: 163 mg/dL — AB (ref 70–99)
GLUCOSE-CAPILLARY: 99 mg/dL (ref 70–99)
Glucose-Capillary: 75 mg/dL (ref 70–99)
Glucose-Capillary: 100 mg/dL — ABNORMAL HIGH (ref 70–99)

## 2014-03-09 LAB — BASIC METABOLIC PANEL
BUN: 38 mg/dL — ABNORMAL HIGH (ref 6–23)
CALCIUM: 9.6 mg/dL (ref 8.4–10.5)
CO2: 23 mEq/L (ref 19–32)
CREATININE: 2.81 mg/dL — AB (ref 0.50–1.10)
Chloride: 95 mEq/L — ABNORMAL LOW (ref 96–112)
GFR calc Af Amer: 20 mL/min — ABNORMAL LOW (ref >90)
GFR, EST NON AFRICAN AMERICAN: 17 mL/min — AB (ref >90)
GLUCOSE: 99 mg/dL (ref 70–99)
Potassium: 5.1 mEq/L (ref 3.7–5.3)
Sodium: 136 mEq/L — ABNORMAL LOW (ref 137–147)

## 2014-03-09 LAB — CREATININE, URINE, RANDOM: CREATININE, URINE: 183.04 mg/dL

## 2014-03-09 LAB — SODIUM, URINE, RANDOM: Sodium, Ur: 26 mEq/L

## 2014-03-09 LAB — TSH: TSH: 1.553 u[IU]/mL (ref 0.350–4.500)

## 2014-03-09 MED ORDER — SODIUM CHLORIDE 0.9 % IV SOLN
INTRAVENOUS | Status: AC
  Administered 2014-03-09: 18:00:00 via INTRAVENOUS

## 2014-03-09 MED ORDER — FUROSEMIDE 40 MG PO TABS: 40.0000 mg | ORAL_TABLET | Freq: Every day | ORAL | Status: DC

## 2014-03-09 MED ORDER — LORAZEPAM 0.5 MG PO TABS
0.5000 mg | ORAL_TABLET | Freq: Once | ORAL | Status: AC
  Administered 2014-03-09: 0.5 mg via ORAL
  Filled 2014-03-09: qty 1

## 2014-03-09 MED ORDER — INSULIN ASPART 100 UNIT/ML ~~LOC~~ SOLN
0.0000 [IU] | Freq: Three times a day (TID) | SUBCUTANEOUS | Status: DC
  Administered 2014-03-09: 2 [IU] via SUBCUTANEOUS
  Administered 2014-03-10 – 2014-03-12 (×3): 1 [IU] via SUBCUTANEOUS
  Administered 2014-03-13: 2 [IU] via SUBCUTANEOUS
  Administered 2014-03-13: 1 [IU] via SUBCUTANEOUS
  Administered 2014-03-14: 2 [IU] via SUBCUTANEOUS
  Administered 2014-03-15: 1 [IU] via SUBCUTANEOUS
  Administered 2014-03-15: 2 [IU] via SUBCUTANEOUS
  Administered 2014-03-16 (×2): 1 [IU] via SUBCUTANEOUS
  Administered 2014-03-17: 2 [IU] via SUBCUTANEOUS
  Administered 2014-03-18 (×2): 1 [IU] via SUBCUTANEOUS
  Administered 2014-03-19: 2 [IU] via SUBCUTANEOUS
  Administered 2014-03-20 (×2): 1 [IU] via SUBCUTANEOUS
  Administered 2014-03-21: 2 [IU] via SUBCUTANEOUS
  Administered 2014-03-22: 1 [IU] via SUBCUTANEOUS
  Administered 2014-03-23 (×2): 2 [IU] via SUBCUTANEOUS
  Administered 2014-03-23: 1 [IU] via SUBCUTANEOUS

## 2014-03-09 NOTE — Progress Notes (Signed)
Utilization Review Completed.Andrea [name] T3/09/2014  

## 2014-03-09 NOTE — Progress Notes (Signed)
PROGRESS NOTE    Andrea HeadingsCorinne G [name] EAV:[id number] DOB: [birth year] DOA: 03/08/2014 PCP: Lolita PatellaEADE,ROBERT ALEXANDER, MD  HPI/Brief narrative 63 year old female patient with history of ischemic dilated cardiomyopathy, ICD, CAD status post stenting, CVA, DM 2, intolerant of ACE inhibitors and ARB's, prolonged hospitalization in January for worsening liver failure and [facility] but not felt to be a candidate (note records available), chronic systolic CHF, HTN, dyslipidemia, stage III chronic kidney disease was admitted on 03/08/14 with complaints of nausea and nonbloody emesis. No sickly contacts. She also had some dyspnea.   Assessment/Plan:  1. Nausea and vomiting/possible acute viral gastroenteritis: Improved after supportive treatment. No vomiting since last night. Mild intermittently nauseous. Asking to advance diet-we'll do and monitor closely. 2. Acute on stage III chronic kidney disease: Baseline creatinine is not clear. She had normal creatinine on 1/18 which had gone up to 2.2 on 1/23. Admitting creatinine on 3/9 was 1.35 but increased to 2.81.? Secondary to intravascular volume depletion versus hemodynamics. Will gently hydrate and follow BMP in a.m. If creatinine worsens or not improving, consider nephrology consultation. Temporarily hold Lasix. We'll check urine sodium, creatinine and for eosinophils. UA not convincing for UTI-DC Rocephin 3. Chronic systolic CHF/ischemic dilated cardiomyopathy/CAD/ICD: No signs of volume overload at this time despite elevated BNP. Lasix held. EF 20% August 2014. 4. Type II DM with nephropathy: Good inpatient control. 5. History of liver failure: LFTs unremarkable except for low albumin and mildly elevated bilirubin.   Code Status: Full Family Communication: None at bedside Disposition Plan: Home when medically stable   Consultants:  Cardiology  Procedures:  None  Antibiotics:  Rocephin 3/9 > 3/10     Subjective: Mild intermittent nausea. No vomiting since last night. Asking to advance diet. No chest pain or dyspnea.  Objective: Filed Vitals:   03/08/14 1214 03/08/14 2040 03/09/14 0550 03/09/14 1313  BP: 111/79 107/70 111/77 99/61  Pulse: 112 105 82 82  Temp: 98 F (36.7 C) 97.6 F (36.4 C) 97.7 F (36.5 C) 87.9 F (31.1 C)  TempSrc: Oral Oral Oral Oral  Resp: 20 18 18 18   Height: 5' (1.524 m)     Weight: 79.3 kg (174 lb 13.2 oz)  82.7 kg (182 lb 5.1 oz)   SpO2: 98% 99% 99% 100%    Intake/Output Summary (Last 24 hours) at 03/09/14 1418 Last data filed at 03/09/14 1230  Gross per 24 hour  Intake    720 ml  Output    800 ml  Net    -80 ml   Filed Weights   03/08/14 1214 03/09/14 0550  Weight: 79.3 kg (174 lb 13.2 oz) 82.7 kg (182 lb 5.1 oz)     Exam:  General exam: Moderately built and nourished female sitting comfortably at the edge of bed. Respiratory system: Clear. No increased work of breathing. Cardiovascular system: S1 & S2 heard, RRR. No JVD, murmurs, gallops, clicks or pedal edema. Telemetry: Sinus rhythm with occasional PVCs. Gastrointestinal system: Abdomen is nondistended, soft and nontender. Normal bowel sounds heard. Central nervous system: Alert and oriented. No focal neurological deficits. Extremities: Symmetric 5 x 5 power.   Data Reviewed: Basic Metabolic Panel:  Recent Labs Lab 03/08/14 0759 03/09/14 0415 03/09/14 1225  NA 139 137 136*  K 4.3 5.2 5.1  CL 98 95* 95*  CO2 24 20 23   GLUCOSE 169* 101* 99  BUN 25* 33* 38*  CREATININE 1.35* 2.32* 2.81*  CALCIUM 9.6 9.6 9.6   Liver  Function Tests:  Recent Labs Lab 03/08/14 0759 03/09/14 0415  AST 25 28  ALT 16 16  ALKPHOS 90 81  BILITOT 1.5* 2.1*  PROT 8.1 7.5  ALBUMIN 3.3* 3.1*    Recent Labs Lab 03/08/14 0759  LIPASE 25    Recent Labs Lab 03/08/14 0759  AMMONIA 21   CBC:  Recent Labs Lab 03/08/14 0759 03/09/14 0415  WBC 6.1 6.3  NEUTROABS 3.3  --   HGB 12.4  12.0  HCT 37.3 35.8*  MCV 68.6* 68.2*  PLT 273 280   Cardiac Enzymes:  Recent Labs Lab 03/08/14 0759 03/09/14 0415  TROPONINI <0.30 <0.30   BNP (last 3 results)  Recent Labs  08/19/13 1024 09/07/13 0955 03/08/14 0759  PROBNP 1769.0* 40.0 2706.0*   CBG:  Recent Labs Lab 03/08/14 0750 03/08/14 2053 03/09/14 0602 03/09/14 1128  GLUCAP 157* 126* 75 100*    No results found for this or any previous visit (from the past 240 hour(s)).    Studies: Dg Chest 2 View  03/08/2014   CLINICAL DATA:  Shortness of breath.  EXAM: CHEST  2 VIEW  COMPARISON:  CT ABD/PELV WO CM dated 01/18/2014; DG CHEST 2 VIEW dated 01/17/2014  FINDINGS: AICD noted with lead tip projected over right ventricle.Cardiomegaly with normal pulmonary vascularity. No focal infiltrate. No pleural effusion or pneumothorax. No acute osseous abnormality. Prior cervical spine fusion.  IMPRESSION: 1. Stable severe cardiomegaly. AICD noted in stable position. No CHF. 2. Interim clearing of small right pleural effusion.   Electronically Signed   By: Maisie Fus  Register   On: 03/08/2014 08:23        Scheduled Meds: . bisoprolol  5 mg Oral Daily  . cefTRIAXone (ROCEPHIN)  IV  1 g Intravenous Q24H  . enoxaparin (LOVENOX) injection  30 mg Subcutaneous Q24H  . [START ON 03/10/2014] furosemide  40 mg Oral Daily  . pantoprazole (PROTONIX) IV  40 mg Intravenous Q12H  . sodium chloride  3 mL Intravenous Q12H   Continuous Infusions:   Active Problems:   Coronary artery disease   Dilated cardiomyopathy   Dyspnea   Heart failure   Chronic systolic CHF (congestive heart failure)   Nausea and vomiting   AICD (automatic cardioverter/defibrillator) present   Sinus tachycardia    Time spent: 45 minutes    Praveen Coia, MD, FACP, FHM. [facility] Pager [phone]  If 7PM-7AM, please contact night-coverage [email or web address] Password [email or web address] 03/09/2014, 2:18 PM    LOS: 1 day

## 2014-03-09 NOTE — Progress Notes (Signed)
PROGRESS NOTE  Subjective:   Pt was admitted with nausea, vomiting, has hx of CAD , CHF and has an ICD. Her creatinine is elevated compared to yesterday. No CP , no dyspea  Objective:    Vital Signs:   Temp:  [97.6 F (36.4 C)-98 F (36.7 C)] 97.7 F (36.5 C) (03/10 0550) Pulse Rate:  [82-112] 82 (03/10 0550) Resp:  [18-26] 18 (03/10 0550) BP: (107-137)/(70-87) 111/77 mmHg (03/10 0550) SpO2:  [97 %-99 %] 99 % (03/10 0550) Weight:  [174 lb 13.2 oz (79.3 kg)-182 lb 5.1 oz (82.7 kg)] 182 lb 5.1 oz (82.7 kg) (03/10 0550)  Last BM Date: 03/05/14   24-hour weight change: Weight change:   Weight trends: Filed Weights   03/08/14 1214 03/09/14 0550  Weight: 174 lb 13.2 oz (79.3 kg) 182 lb 5.1 oz (82.7 kg)    Intake/Output:  03/09 0701 - 03/10 0700 In: 120 [P.O.:120] Out: 200 [Urine:200]     Physical Exam: BP 111/77  Pulse 82  Temp(Src) 97.7 F (36.5 C) (Oral)  Resp 18  Ht 5' (1.524 m)  Wt 182 lb 5.1 oz (82.7 kg)  BMI 35.61 kg/m2  SpO2 99%  Wt Readings from Last 3 Encounters:  03/09/14 182 lb 5.1 oz (82.7 kg)  01/22/14 203 lb 4.2 oz (92.2 kg)  11/19/13 195 lb (88.451 kg)    General: Vital signs reviewed and noted.   Head: Normocephalic, atraumatic.  Eyes: conjunctivae/corneas clear.  EOM's intact.   Throat: normal  Neck:  normal   Lungs:    clear  Heart:  RR,   Abdomen:  Soft, non-tender, non-distended    Extremities: No edema   Neurologic: A&O X3, CN II - XII are grossly intact.   Psych: Normal     Labs: BMET:  Recent Labs  03/08/14 0759 03/09/14 0415  NA 139 137  K 4.3 5.2  CL 98 95*  CO2 24 20  GLUCOSE 169* 101*  BUN 25* 33*  CREATININE 1.35* 2.32*  CALCIUM 9.6 9.6    Liver function tests:  Recent Labs  03/08/14 0759 03/09/14 0415  AST 25 28  ALT 16 16  ALKPHOS 90 81  BILITOT 1.5* 2.1*  PROT 8.1 7.5  ALBUMIN 3.3* 3.1*    Recent Labs  03/08/14 0759  LIPASE 25    CBC:  Recent Labs  03/08/14 0759  03/09/14 0415  WBC 6.1 6.3  NEUTROABS 3.3  --   HGB 12.4 12.0  HCT 37.3 35.8*  MCV 68.6* 68.2*  PLT 273 280    Cardiac Enzymes:  Recent Labs  03/08/14 0759 03/09/14 0415  TROPONINI <0.30 <0.30    Coagulation Studies:  Recent Labs  03/08/14 0759  LABPROT 15.9*  INR 1.30      Other results:  Tele:  NSR at 81   Medications:    Infusions:    Scheduled Medications: . bisoprolol  5 mg Oral Daily  . cefTRIAXone (ROCEPHIN)  IV  1 g Intravenous Q24H  . enoxaparin (LOVENOX) injection  30 mg Subcutaneous Q24H  . [START ON 03/10/2014] furosemide  40 mg Oral Daily  . pantoprazole (PROTONIX) IV  40 mg Intravenous Q12H  . sodium chloride  3 mL Intravenous Q12H    Assessment/ Plan:   Active Problems:   Coronary artery disease   Dilated cardiomyopathy   Dyspnea   Heart failure   Chronic systolic CHF (congestive heart failure)   Nausea and vomiting   AICD (automatic cardioverter/defibrillator) present  Sinus tachycardia   1. CAD:  No angina, cont. meds- bisoprolol 5 a day,  Has an allergy to aspirin, brilinta,    2. Chronic systlic CHF:  Stable. , cont. bisoprolol    3. Acute renal insufficiency:  Creatinine is higher than yesterday.  ? Lab error. May be the cause of her nausea. Plans per int. Med.   Length of Stay: 1  Vesta Mixer, Montez Hageman., MD, [facility] 03/09/2014, 9:31 AM Office [phone] Pager [phone]

## 2014-03-09 NOTE — Progress Notes (Signed)
Bladder scan completed, 128 ml present. MD paged.  Pt also c/o nausea with soft diet. Moved back to liquid diet as previously ordered. Will continue to monitor.

## 2014-03-09 NOTE — Progress Notes (Signed)
MD paged and aware, Pt c/o cramping in L arm from fingertips to shoulder. Pain medication given but no relief. New orders given. Night shift RN made aware. Marland Kitchen

## 2014-03-10 ENCOUNTER — Inpatient Hospital Stay (HOSPITAL_COMMUNITY): Payer: BC Managed Care – PPO

## 2014-03-10 DIAGNOSIS — I5023 Acute on chronic systolic (congestive) heart failure: Principal | ICD-10-CM

## 2014-03-10 DIAGNOSIS — N179 Acute kidney failure, unspecified: Secondary | ICD-10-CM

## 2014-03-10 DIAGNOSIS — J9600 Acute respiratory failure, unspecified whether with hypoxia or hypercapnia: Secondary | ICD-10-CM

## 2014-03-10 LAB — GLUCOSE, CAPILLARY
GLUCOSE-CAPILLARY: 141 mg/dL — AB (ref 70–99)
GLUCOSE-CAPILLARY: 130 mg/dL — AB (ref 70–99)
Glucose-Capillary: 64 mg/dL — ABNORMAL LOW (ref 70–99)
Glucose-Capillary: 65 mg/dL — ABNORMAL LOW (ref 70–99)
Glucose-Capillary: 90 mg/dL (ref 70–99)
Glucose-Capillary: 135 mg/dL — ABNORMAL HIGH (ref 70–99)

## 2014-03-10 LAB — BASIC METABOLIC PANEL
BUN: 49 mg/dL — ABNORMAL HIGH (ref 6–23)
CALCIUM: 9.6 mg/dL (ref 8.4–10.5)
CO2: 20 mEq/L (ref 19–32)
Chloride: 94 mEq/L — ABNORMAL LOW (ref 96–112)
Creatinine, Ser: 3.83 mg/dL — ABNORMAL HIGH (ref 0.50–1.10)
GFR calc non Af Amer: 12 mL/min — ABNORMAL LOW (ref >90)
GFR, EST AFRICAN AMERICAN: 14 mL/min — AB (ref >90)
GLUCOSE: 70 mg/dL (ref 70–99)
POTASSIUM: 5.1 meq/L (ref 3.7–5.3)
SODIUM: 136 meq/L — AB (ref 137–147)

## 2014-03-10 MED ORDER — SODIUM CHLORIDE 0.9 % IV SOLN
INTRAVENOUS | Status: DC
  Administered 2014-03-10: 75 mL/h via INTRAVENOUS
  Administered 2014-03-12: 03:00:00 via INTRAVENOUS

## 2014-03-10 MED ORDER — DEXTROSE-NACL 5-0.9 % IV SOLN
INTRAVENOUS | Status: DC
  Filled 2014-03-10: qty 1000

## 2014-03-10 MED ORDER — PANTOPRAZOLE SODIUM 40 MG IV SOLR
40.0000 mg | INTRAVENOUS | Status: DC
  Administered 2014-03-10: 40 mg via INTRAVENOUS
  Filled 2014-03-10 (×2): qty 40

## 2014-03-10 NOTE — Progress Notes (Addendum)
PROGRESS NOTE    ETTEL SIPOS CHY:[id number] DOB: August 02, 1951 DOA: 03/08/2014 PCP: Lolita Patella, MD  HPI/Brief narrative 63 year old female patient with history of ischemic dilated cardiomyopathy, ICD, CAD status post stenting, CVA, DM 2, intolerant of ACE inhibitors and ARB's, prolonged hospitalization in January for worsening liver failure and transferred to [facility] for consideration of liver transplant but not felt to be a candidate (note records available), chronic systolic CHF, HTN, dyslipidemia, stage III chronic kidney disease was admitted on 03/08/14 with complaints of nausea and nonbloody emesis. No sickly contacts. She also had some dyspnea.   Assessment/Plan:  1. Nausea and vomiting      -could have started out as gastroenetritis, now worsening likely due to worsening kidney function  2. Acute on stage III chronic kidney disease: Baseline creatinine is not clear. She had normal creatinine on 1/18 which had gone up to 2.2 on 1/23. Admitting creatinine on 3/9 was 1.35 but increased to 2.81 and 3.8 today     -suspect multifactorial, ? Cardio-renal vs Hepato renal syndrome     -strict I/Os     -check renal US     -Renal consult D/w Dr.Mattingly     -gentle hydration      -check UA, Urine Na     -Requested DC notes from [facility], unable to access this on Care everywhere  3. Chronic systolic CHF/ischemic dilated cardiomyopathy/CAD/ICD: No signs of volume overload at this time despite elevated BNP and 1 plus edema . Lasix held. EF 20% August 2014.  4. Type II DM with nephropathy:       -with hypoglycemia      -add D5 with NS      -monitor CBGs  5. History of liver failure: and cirrhosis based on USG done at [facility]       -LFTs unremarkable except for low albumin and mildly elevated bilirubin.       -Extensive unremarkable workup done last admission for liver failure which was unrevealing       -DC summary requested from [facility]       -Cmet in am  Code Status: Full Family  Communication: None at bedside Disposition Plan: Home when medically stable   Consultants:  Cardiology  Procedures:  None  Antibiotics:  Rocephin 3/9 > 3/10   Subjective: Still with nausea, no vomiting, groggy this am  Objective: Filed Vitals:   03/09/14 0550 03/09/14 1313 03/09/14 2212 03/10/14 0605  BP: 111/77 99/61 103/76 117/80  Pulse: 82 82 75 81  Temp: 97.7 F (36.5 C) 97.2 F (36.2 C) 97.6 F (36.4 C) 97.8 F (36.6 C)  TempSrc: Oral Oral Oral Oral  Resp: 18 18 18 18   Height:      Weight: 82.7 kg (182 lb 5.1 oz)   83.3 kg (183 lb 10.3 oz)  SpO2: 99% 100% 100% 100%    Intake/Output Summary (Last 24 hours) at 03/10/14 1104 Last data filed at 03/09/14 1745  Gross per 24 hour  Intake    600 ml  Output   1010 ml  Net   -410 ml   Filed Weights   03/08/14 1214 03/09/14 0550 03/10/14 0605  Weight: 79.3 kg (174 lb 13.2 oz) 82.7 kg (182 lb 5.1 oz) 83.3 kg (183 lb 10.3 oz)     Exam:  General exam: Moderately built and nourished female, lethargic, arousible but sleepy, answers most questions. Respiratory system: Clear. No increased work of breathing. Cardiovascular system: S1 & S2 heard, RRR. No JVD, murmurs, gallops, clicks  or pedal edema. Telemetry: Sinus rhythm with occasional PVCs. Gastrointestinal system: Abdomen is nondistended, soft and nontender. Normal bowel sounds heard. Central nervous system: Alert and oriented. No focal neurological deficits. Ext: 1 plus edema  Extremities: Symmetric 5 x 5 power.   Data Reviewed: Basic Metabolic Panel:  Recent Labs Lab 03/08/14 0759 03/09/14 0415 03/09/14 1225 03/10/14 0500  NA 139 137 136* 136*  K 4.3 5.2 5.1 5.1  CL 98 95* 95* 94*  CO2 24 20 23 20   GLUCOSE 169* 101* 99 70  BUN 25* 33* 38* 49*  CREATININE 1.35* 2.32* 2.81* 3.83*  CALCIUM 9.6 9.6 9.6 9.6   Liver Function Tests:  Recent Labs Lab 03/08/14 0759 03/09/14 0415  AST 25 28  ALT 16 16  ALKPHOS 90 81  BILITOT 1.5* 2.1*  PROT 8.1  7.5  ALBUMIN 3.3* 3.1*    Recent Labs Lab 03/08/14 0759  LIPASE 25    Recent Labs Lab 03/08/14 0759  AMMONIA 21   CBC:  Recent Labs Lab 03/08/14 0759 03/09/14 0415  WBC 6.1 6.3  NEUTROABS 3.3  --   HGB 12.4 12.0  HCT 37.3 35.8*  MCV 68.6* 68.2*  PLT 273 280   Cardiac Enzymes:  Recent Labs Lab 03/08/14 0759 03/09/14 0415  TROPONINI <0.30 <0.30   BNP (last 3 results)  Recent Labs  08/19/13 1024 09/07/13 0955 03/08/14 0759  PROBNP 1769.0* 40.0 2706.0*   CBG:  Recent Labs Lab 03/09/14 1620 03/09/14 2209 03/10/14 0613 03/10/14 0649 03/10/14 0714  GLUCAP 163* 99 64* 65* 90    Recent Results (from the past 240 hour(s))  URINE CULTURE     Status: None   Collection Time    03/08/14  6:36 PM      Result Value Ref Range Status   Specimen Description URINE, RANDOM   Final   Special Requests NONE   Final   Culture  Setup Time     Final   Value: 03/09/2014 00:07     Performed at [facility]   [place] Count PENDING   Incomplete   Culture     Final   Value: Culture reincubated for better growth     Performed at [facility]   Report Status PENDING   Incomplete      Studies: No results found.      Scheduled Meds: . bisoprolol  5 mg Oral Daily  . enoxaparin (LOVENOX) injection  30 mg Subcutaneous Q24H  . insulin aspart  0-9 Units Subcutaneous TID WC  . pantoprazole (PROTONIX) IV  40 mg Intravenous Q12H  . sodium chloride  3 mL Intravenous Q12H   Continuous Infusions: . dextrose 5 % and 0.9% NaCl 1,000 mL infusion      Active Problems:   Diabetes mellitus type 2, controlled   Coronary artery disease   Dilated cardiomyopathy   Dehydration   Dyspnea   Heart failure   Chronic systolic CHF (congestive heart failure)   Nausea and vomiting   AICD (automatic cardioverter/defibrillator) present   Sinus tachycardia   Renal failure, acute on chronic    Time spent: 35 minutes    Lonnell Chaput, MD,  [facility] Pager [phone]  If 7PM-7AM, please contact night-coverage [email or web address] Password [email or web address] 03/10/2014, 11:04 AM    LOS: 2 days

## 2014-03-10 NOTE — Care Management (Signed)
Patient is active with [facility] with RN and PT services. I will follow for discharge follow up. Thank you, Ayesha Rumpf RN BSN [facility]

## 2014-03-10 NOTE — Consult Note (Addendum)
Andrea HeadingsCorinne G [name] is an 63 y.o. female referred by Dr Jomarie LongsJoseph   Chief Complaint: AFR HPI: 62yo BF with hx CKD admitted 03/08/14 for N/V.  Has hx cirrhosis and cardiomyopathy with EF 20%.  Scr in 1/15 was in the 2's. On admission 1.35 and has progressively increased to 3.8.  BP has been lowish.  UO 1.3 L yest. Hx DM and HTN x 53108yrs.  No nephrotoxins.  Past Medical History  Diagnosis Date  . Chronic systolic heart failure   . Hypertension   . Stroke 02/2010    left brain CVA? recurrent TIA's treated with TPA  . Dyslipidemia   . Premature ventricular contractions (PVCs) (VPCs)   . GERD (gastroesophageal reflux disease)   . Coronary artery disease     a. DES-dRCA 11/2010. b. DES-pRCA 04/2011 (in an area free of disease on prior cath). c. s/p DES-mRCA 03/2013 for NSTEMI.  . Ischemic cardiomyopathy     a. Hx of medtronic ICD. b. EF 15-20% by cath 03/2013.  . Pulmonary hypertension   . Gout   . Obesity   . Thrombocytopenia   . Depression   . Hypotension     a. Admission 09/2012 for hypotn & ARF. b. Meds held 03/2013 due to hypotension.  . Acute renal insufficiency     a. 09/2012. b. Also noted post-cath 03/2013.  . NSTEMI (non-ST elevated myocardial infarction) 03/2013  . Anginal pain   . Asthma   . Microcytic anemia     a. Noted 03/2013 on labs, iron studies WNL.  Marland Kitchen. Chronic lower back pain   . Anxiety   . CHF (congestive heart failure)   . CKD (chronic kidney disease) stage 3, GFR 30-59 ml/min   . HTN (hypertension)   . Primary pulmonary HTN   . Automatic implantable cardiac defibrillator - Medtronic 08/24/2013  . Automatic implantable cardioverter-defibrillator in situ   . Heart murmur   . Pneumonia ~ 2001; 2014    "double; late last year" (01/18/2014)  . Shortness of breath     "can happen at anytime" (01/18/2014)  . Type II diabetes mellitus   . Migraine headache     "no pain; aura/visual problems only; at least 2-3X/month" (01/18/2014)  . Headaches, cluster     "monthly"  (01/18/2014)  . Arthritis     "knees" (01/18/2014)  . DJD (degenerative joint disease)   . Bipolar disorder     Past Surgical History  Procedure Laterality Date  . Mass excision Left     hip  . Cervical polypectomy  1970's  . [place] echocardiography  2011  . Tonsillectomy  1960's?  . Right oophorectomy  1980's  . Dilation and curettage of uterus  1970's  . Ostectomy metatarsal Left 1993    "5th toe; from rickets" (05/07/2013)  . Implantable cardioverter defibrillator implant  ~ 2008  . Coronary angioplasty with stent placement  2000's    "2 stents" (05/07/2013)  . Coronary angioplasty with stent placement  03/2013    PCI to RCA/notes 05/05/2013; "makes total of 3" (05/07/2013)    Family History  Problem Relation Age of Onset  . Heart failure Mother   . Heart attack Father   . Heart disease Brother    Social History:  reports that she quit smoking about 31 years ago. Her smoking use included Cigarettes. She has a .25 pack-year smoking history. She has never used smokeless tobacco. She reports that she does not drink alcohol or use illicit drugs.  Allergies:  Allergies  Allergen Reactions  . Asa [Aspirin] Shortness Of Breath  . Aspirin Shortness Of Breath and Other (See Comments)    Asthma symptoms  . Brilinta [Ticagrelor] Shortness Of Breath and Other (See Comments)    Gout   . Percocet [Oxycodone-Acetaminophen] Nausea And Vomiting  . Darvon Nausea And Vomiting  . Diovan [Valsartan] Other (See Comments)    unknown  . Imitrex [Sumatriptan Base] Other (See Comments)    Unknown reaction  . Licorice Flavor [Artificial Licorice Flavor]     Black licorice  . Nsaids Nausea And Vomiting  . Nsaids Other (See Comments)    GI upset     Medications Prior to Admission  Medication Sig Dispense Refill  . furosemide (LASIX) 40 MG tablet Take 40 mg by mouth daily.      Marland Kitchen lactulose (CHRONULAC) 10 GM/15ML solution Take 20 g by mouth 2 (two) times daily as needed for mild constipation.        . Melatonin 3 MG TABS Take 3 mg by mouth at bedtime as needed (for sleep).       . metoprolol tartrate (LOPRESSOR) 25 MG tablet Take 12.5 mg by mouth 2 (two) times daily.       . nitroGLYCERIN (NITROSTAT) 0.4 MG SL tablet Place 0.4 mg under the tongue every 5 (five) minutes as needed for chest pain.      Marland Kitchen omeprazole (PRILOSEC) 20 MG capsule Take 20 mg by mouth daily.       . traMADol (ULTRAM) 50 MG tablet Take 50 mg by mouth daily as needed for moderate pain.         Lab Results: UA: > 300 protein, 3-6 wbc, 0-2 rbc   Recent Labs  03/08/14 0759 03/09/14 0415  WBC 6.1 6.3  HGB 12.4 12.0  HCT 37.3 35.8*  PLT 273 280   BMET  Recent Labs  03/09/14 0415 03/09/14 1225 03/10/14 0500  NA 137 136* 136*  K 5.2 5.1 5.1  CL 95* 95* 94*  CO2 20 23 20   GLUCOSE 101* 99 70  BUN 33* 38* 49*  CREATININE 2.32* 2.81* 3.83*  CALCIUM 9.6 9.6 9.6   LFT  Recent Labs  03/09/14 0415  PROT 7.5  ALBUMIN 3.1*  AST 28  ALT 16  ALKPHOS 81  BILITOT 2.1*   No results found.  ROS: No change in vision No sob No cp + constipation No abd pain +n/v No arthritic CO + numbness hands and feet  PHYSICAL EXAM: Blood pressure 101/72, pulse 78, temperature 97.2 F (36.2 C), temperature source Oral, resp. rate 18, height 5' (1.524 m), weight 83.3 kg (183 lb 10.3 oz), SpO2 100.00%. HEENT: PERRLA EOMI non-icteric NECK:No JVD LUNGS:clear CARDIAC:RRR ABD:+ BS NTND LPF:XTKWI edema on Rt LE.  Rt arm PICC line NEURO:CNI decreased sensation both feet. Ox3 No asterixis  Assessment: 1. Acute on CKD3 that I suspect is hemodynamically mediated by low BP in face of poor CO 2. Cardiomyopathy 3. Hepatic cirrhosis?  Had Acute liver failure in Jan 4. DM  PLAN: 1. Dc bystolic for now 2. Gentle hydration as you have ordered 3. Check SPEP 4. Daily Scr 5. I/O's   Andrea [name] T 03/10/2014, 2:09 PM

## 2014-03-10 NOTE — Progress Notes (Signed)
PROGRESS NOTE  Subjective:   Pt was admitted with nausea, vomiting, has hx of CAD , CHF and has an ICD. Her creatinine is elevated compared to yesterday. No CP , no dyspea  Objective:    Vital Signs:   Temp:  [97.2 F (36.2 C)-97.8 F (36.6 C)] 97.2 F (36.2 C) (03/11 1346) Pulse Rate:  [75-81] 78 (03/11 1346) Resp:  [18] 18 (03/11 1346) BP: (101-117)/(72-80) 101/72 mmHg (03/11 1346) SpO2:  [100 %] 100 % (03/11 1346) Weight:  [183 lb 10.3 oz (83.3 kg)] 183 lb 10.3 oz (83.3 kg) (03/11 0605)  Last BM Date: 03/05/14   24-hour weight change: Weight change: 8 lb 13.1 oz (4 kg)  Weight trends: Filed Weights   03/08/14 1214 03/09/14 0550 03/10/14 0605  Weight: 174 lb 13.2 oz (79.3 kg) 182 lb 5.1 oz (82.7 kg) 183 lb 10.3 oz (83.3 kg)    Intake/Output:  03/10 0701 - 03/11 0700 In: 840 [P.O.:840] Out: 1310 [Urine:1310] Total I/O In: 720 [P.O.:720] Out: 650 [Urine:650]   Physical Exam: BP 101/72  Pulse 78  Temp(Src) 97.2 F (36.2 C) (Oral)  Resp 18  Ht 5' (1.524 m)  Wt 183 lb 10.3 oz (83.3 kg)  BMI 35.87 kg/m2  SpO2 100%  Wt Readings from Last 3 Encounters:  03/10/14 183 lb 10.3 oz (83.3 kg)  01/22/14 203 lb 4.2 oz (92.2 kg)  11/19/13 195 lb (88.451 kg)    General: Vital signs reviewed and noted.   Head: Normocephalic, atraumatic.  Eyes: conjunctivae/corneas clear.  EOM's intact.   Throat: normal  Neck:  normal   Lungs:    clear  Heart:  RR,   Abdomen:  Soft, non-tender, non-distended    Extremities: No edema   Neurologic: A&O X3, CN II - XII are grossly intact.   Psych: Normal     Labs: BMET:  Recent Labs  03/09/14 1225 03/10/14 0500  NA 136* 136*  K 5.1 5.1  CL 95* 94*  CO2 23 20  GLUCOSE 99 70  BUN 38* 49*  CREATININE 2.81* 3.83*  CALCIUM 9.6 9.6    Liver function tests:  Recent Labs  03/08/14 0759 03/09/14 0415  AST 25 28  ALT 16 16  ALKPHOS 90 81  BILITOT 1.5* 2.1*  PROT 8.1 7.5  ALBUMIN 3.3* 3.1*    Recent  Labs  03/08/14 0759  LIPASE 25    CBC:  Recent Labs  03/08/14 0759 03/09/14 0415  WBC 6.1 6.3  NEUTROABS 3.3  --   HGB 12.4 12.0  HCT 37.3 35.8*  MCV 68.6* 68.2*  PLT 273 280    Cardiac Enzymes:  Recent Labs  03/08/14 0759 03/09/14 0415  TROPONINI <0.30 <0.30    Coagulation Studies:  Recent Labs  03/08/14 0759  LABPROT 15.9*  INR 1.30      Other results:  Tele:  NSR at 81   Medications:    Infusions: . sodium chloride      Scheduled Medications: . bisoprolol  5 mg Oral Daily  . enoxaparin (LOVENOX) injection  30 mg Subcutaneous Q24H  . insulin aspart  0-9 Units Subcutaneous TID WC  . pantoprazole (PROTONIX) IV  40 mg Intravenous Q24H  . sodium chloride  3 mL Intravenous Q12H    Assessment/ Plan:   Active Problems:   Diabetes mellitus type 2, controlled   Coronary artery disease   Dilated cardiomyopathy   Dehydration   Dyspnea   Heart failure   Chronic systolic  CHF (congestive heart failure)   Nausea and vomiting   AICD (automatic cardioverter/defibrillator) present   Sinus tachycardia   Renal failure, acute on chronic   1. CAD:  No angina, cont. meds-  2. Chronic systlic CHF:  Stable. , cont. bisoprolol    3. Acute renal insufficiency:  Discussed with dr. Briant CedarMattingly.  He would like to hold bisoprolol for a day or so to see if she can perfuse her kidneys better.  I think she may be a bit intravascular volume depleted.    Length of Stay: 2  Vesta MixerPhilip J. Javonda Suh, Montez HagemanJr., MD, [facility] 03/10/2014, 2:32 PM Office [phone] Pager [phone]

## 2014-03-11 DIAGNOSIS — Z95810 Presence of automatic (implantable) cardiac defibrillator: Secondary | ICD-10-CM

## 2014-03-11 DIAGNOSIS — I369 Nonrheumatic tricuspid valve disorder, unspecified: Secondary | ICD-10-CM

## 2014-03-11 LAB — URINALYSIS, ROUTINE W REFLEX MICROSCOPIC
Glucose, UA: NEGATIVE mg/dL
Hgb urine dipstick: NEGATIVE
KETONES UR: NEGATIVE mg/dL
LEUKOCYTES UA: NEGATIVE
NITRITE: NEGATIVE
PH: 5 (ref 5.0–8.0)
Protein, ur: 100 mg/dL — AB
Specific Gravity, Urine: 1.02 (ref 1.005–1.030)
UROBILINOGEN UA: 0.2 mg/dL (ref 0.0–1.0)

## 2014-03-11 LAB — COMPREHENSIVE METABOLIC PANEL
ALK PHOS: 102 U/L (ref 39–117)
ALT: 25 U/L (ref 0–35)
AST: 49 U/L — ABNORMAL HIGH (ref 0–37)
Albumin: 3.1 g/dL — ABNORMAL LOW (ref 3.5–5.2)
BILIRUBIN TOTAL: 2.5 mg/dL — AB (ref 0.3–1.2)
BUN: 59 mg/dL — AB (ref 6–23)
CHLORIDE: 94 meq/L — AB (ref 96–112)
CO2: 20 meq/L (ref 19–32)
CREATININE: 4.49 mg/dL — AB (ref 0.50–1.10)
Calcium: 9.3 mg/dL (ref 8.4–10.5)
GFR calc Af Amer: 11 mL/min — ABNORMAL LOW (ref >90)
GFR, EST NON AFRICAN AMERICAN: 10 mL/min — AB (ref >90)
Glucose, Bld: 81 mg/dL (ref 70–99)
Potassium: 5.1 mEq/L (ref 3.7–5.3)
Sodium: 136 mEq/L — ABNORMAL LOW (ref 137–147)
Total Protein: 7.2 g/dL (ref 6.0–8.3)

## 2014-03-11 LAB — PHOSPHORUS: PHOSPHORUS: 6.8 mg/dL — AB (ref 2.3–4.6)

## 2014-03-11 LAB — GLUCOSE, CAPILLARY
Glucose-Capillary: 80 mg/dL (ref 70–99)
Glucose-Capillary: 90 mg/dL (ref 70–99)
Glucose-Capillary: 98 mg/dL (ref 70–99)
Glucose-Capillary: 92 mg/dL (ref 70–99)

## 2014-03-11 LAB — URINE MICROSCOPIC-ADD ON: WBC, UA: 3 WBC/hpf — ABNORMAL HIGH (ref <3)

## 2014-03-11 LAB — CBC
HCT: 33.7 % — ABNORMAL LOW (ref 36.0–46.0)
Hemoglobin: 11.4 g/dL — ABNORMAL LOW (ref 12.0–15.0)
MCH: 22.4 pg — ABNORMAL LOW (ref 26.0–34.0)
MCHC: 33.8 g/dL (ref 30.0–36.0)
MCV: 66.3 fL — ABNORMAL LOW (ref 78.0–100.0)
PLATELETS: 248 10*3/uL (ref 150–400)
RBC: 5.08 MIL/uL (ref 3.87–5.11)
RDW: 20.6 % — AB (ref 11.5–15.5)
WBC: 6.3 10*3/uL (ref 4.0–10.5)

## 2014-03-11 LAB — CREATININE, URINE, RANDOM: Creatinine, Urine: 185.51 mg/dL

## 2014-03-11 LAB — AMMONIA: Ammonia: 50 umol/L (ref 11–60)

## 2014-03-11 LAB — SODIUM, URINE, RANDOM: Sodium, Ur: 28 mEq/L

## 2014-03-11 MED ORDER — PANTOPRAZOLE SODIUM 40 MG PO TBEC
40.0000 mg | DELAYED_RELEASE_TABLET | Freq: Every day | ORAL | Status: DC
  Administered 2014-03-11 – 2014-03-24 (×14): 40 mg via ORAL
  Filled 2014-03-11 (×14): qty 1

## 2014-03-11 NOTE — Progress Notes (Signed)
PROGRESS NOTE    [name] Vecchiarelli WUJ:[id number] DOB: 08-29-1951 DOA: 03/08/2014 PCP: Lolita PatellaEADE,ROBERT ALEXANDER, MD  HPI/Brief narrative 63 year old female patient with history of ischemic dilated cardiomyopathy, ICD, CAD status post stenting, CVA, DM 2, intolerant of ACE inhibitors and ARB's, prolonged hospitalization in January for worsening liver failure and [facility] but not felt to be a candidate (note records available), chronic systolic CHF, HTN, dyslipidemia, stage III chronic kidney disease was admitted on 03/08/14 with complaints of nausea and nonbloody emesis. No sickly contacts. She also had some dyspnea.   Assessment/Plan:  1. Nausea and vomiting      -could have started out as gastroenetritis, now worsening likely due to worsening kidney function      -improved, advance diet  2. Acute on stage III chronic kidney disease: Baseline creatinine is not clear. She had normal creatinine on 1/18 which had gone up to 2.2 on 1/23. Admitting creatinine on 3/9 was 1.35->2.81->3.8 now ->4.49     -suspect multifactorial, ? Cardio-renal vs Hepato renal syndrome     -strict I/Os     -Renal US: suggesting medical renal disease, no hydronephrosis     -Renal consult appreciated     -continue gentle hydration      -check UA, Urine Na  3. Chronic systolic CHF/ischemic dilated cardiomyopathy/CAD/ICD: No signs of volume overload at this time despite elevated BNP and 1 plus edema . Lasix held. EF 20% August 2014.  4. Type II DM with nephropathy:       -with hypoglycemia      -improved, sensitive SSI      -monitor CBGs  5. History of liver failure: and cirrhosis based on USG done at [facility]       -LFTs unremarkable except for low albumin and mildly elevated bilirubin.       -Extensive unremarkable workup done last admission for liver failure which was unrevealing       -based on [facility] DC summary felt to be due to Cardiomyopathy       -check  ammonia  Code Status: Full Family Communication: None at bedside Disposition Plan: Home when medically stable   Consultants:  Cardiology  Procedures:  None  Antibiotics:  Rocephin 3/9 > 3/10   Subjective: Still with nausea, no vomiting, groggy this am  Objective: Filed Vitals:   03/10/14 1346 03/10/14 1927 03/11/14 0555 03/11/14 1336  BP: 101/72 109/74 105/71 108/68  Pulse: 78 78 76 72  Temp: 97.2 F (36.2 C) 97.3 F (36.3 C) 97.9 F (36.6 C) 98.2 F (36.8 C)  TempSrc: Oral Oral Oral Oral  Resp: 18 18 18 18   Height:      Weight:   84.9 kg (187 lb 2.7 oz)   SpO2: 100% 98% 97% 99%    Intake/Output Summary (Last 24 hours) at 03/11/14 1426 Last data filed at 03/11/14 0730  Gross per 24 hour  Intake    720 ml  Output   1250 ml  Net   -530 ml   Filed Weights   03/09/14 0550 03/10/14 0605 03/11/14 0555  Weight: 82.7 kg (182 lb 5.1 oz) 83.3 kg (183 lb 10.3 oz) 84.9 kg (187 lb 2.7 oz)     Exam:  General exam: Moderately built and nourished female, lethargic, arousible but sleepy, answers most questions. Respiratory system: Clear. No increased work of breathing. Cardiovascular system: S1 & S2 heard, RRR. No JVD, murmurs, gallops, clicks or pedal edema. Telemetry: Sinus rhythm with occasional PVCs.  Gastrointestinal system: Abdomen is nondistended, soft and nontender. Normal bowel sounds heard. Central nervous system: Alert and oriented. No focal neurological deficits. Ext: 1 plus edema  Extremities: Symmetric 5 x 5 power.   Data Reviewed: Basic Metabolic Panel:  Recent Labs Lab 03/08/14 0759 03/09/14 0415 03/09/14 1225 03/10/14 0500 03/11/14 0515  NA 139 137 136* 136* 136*  K 4.3 5.2 5.1 5.1 5.1  CL 98 95* 95* 94* 94*  CO2 24 20 23 20 20   GLUCOSE 169* 101* 99 70 81  BUN 25* 33* 38* 49* 59*  CREATININE 1.35* 2.32* 2.81* 3.83* 4.49*  CALCIUM 9.6 9.6 9.6 9.6 9.3  PHOS  --   --   --   --  6.8*   Liver Function Tests:  Recent Labs Lab  03/08/14 0759 03/09/14 0415 03/11/14 0515  AST 25 28 49*  ALT 16 16 25   ALKPHOS 90 81 102  BILITOT 1.5* 2.1* 2.5*  PROT 8.1 7.5 7.2  ALBUMIN 3.3* 3.1* 3.1*    Recent Labs Lab 03/08/14 0759  LIPASE 25    Recent Labs Lab 03/08/14 0759 03/11/14 0930  AMMONIA 21 50   CBC:  Recent Labs Lab 03/08/14 0759 03/09/14 0415 03/11/14 0515  WBC 6.1 6.3 6.3  NEUTROABS 3.3  --   --   HGB 12.4 12.0 11.4*  HCT 37.3 35.8* 33.7*  MCV 68.6* 68.2* 66.3*  PLT 273 280 248   Cardiac Enzymes:  Recent Labs Lab 03/08/14 0759 03/09/14 0415  TROPONINI <0.30 <0.30   BNP (last 3 results)  Recent Labs  08/19/13 1024 09/07/13 0955 03/08/14 0759  PROBNP 1769.0* 40.0 2706.0*   CBG:  Recent Labs Lab 03/10/14 1120 03/10/14 1657 03/10/14 2133 03/11/14 0557 03/11/14 1121  GLUCAP 135* 141* 130* 80 90    Recent Results (from the past 240 hour(s))  URINE CULTURE     Status: None   Collection Time    03/08/14  6:36 PM      Result Value Ref Range Status   Specimen Description URINE, RANDOM   Final   Special Requests NONE   Final   Culture  Setup Time     Final   Value: 03/09/2014 00:07     Performed at [facility] Count     Final   Value: 90,000 COLONIES/ML     Performed at [facility]   Culture     Final   Value: GRAM POSITIVE COCCI     Performed at [facility]   Report Status PENDING   Incomplete      Studies: US Renal  03/10/2014   CLINICAL DATA Acute kidney injury  EXAM RENAL/URINARY TRACT ULTRASOUND COMPLETE  COMPARISON 01/20/2014  FINDINGS Right Kidney:  Length: 12.4 cm. Lobular contour. Echogenic renal parenchyma, suggesting medical renal disease. No mass or hydronephrosis.  Left Kidney:  Length: 11.5 cm. Lobular contour. Echogenic renal parenchyma, suggesting medical renal disease. No mass or hydronephrosis.  Bladder:  Underdistended but grossly unremarkable.  IMPRESSION Echogenic renal parenchyma, suggesting medical renal  disease.  No hydronephrosis.  SIGNATURE  Electronically Signed   By: Charline Bills M.D.   On: 03/10/2014 17:11        Scheduled Meds: . enoxaparin (LOVENOX) injection  30 mg Subcutaneous Q24H  . insulin aspart  0-9 Units Subcutaneous TID WC  . pantoprazole  40 mg Oral Q1200  . sodium chloride  3 mL Intravenous Q12H   Continuous Infusions: . sodium chloride 75 mL/hr (03/10/14 1400)  Active Problems:   Diabetes mellitus type 2, controlled   Coronary artery disease   Dilated cardiomyopathy   Dehydration   Dyspnea   Heart failure   Chronic systolic CHF (congestive heart failure)   Nausea and vomiting   AICD (automatic cardioverter/defibrillator) present   Sinus tachycardia   Renal failure, acute on chronic  Time spent: 35 minutes  Sherlie Boyum, MD,  [facility] Pager [phone]  If 7PM-7AM, please contact night-coverage [email or web address] Password [email or web address] 03/11/2014, 2:26 PM    LOS: 3 days

## 2014-03-11 NOTE — Progress Notes (Signed)
PROGRESS NOTE  Subjective:   Pt was admitted with nausea, vomiting, has hx of CAD , CHF and has an ICD. Her creatinine has continued to climb No CP , no dyspea  Objective:    Vital Signs:   Temp:  [97.3 F (36.3 C)-98.2 F (36.8 C)] 98.2 F (36.8 C) (03/12 1336) Pulse Rate:  [72-78] 72 (03/12 1336) Resp:  [18] 18 (03/12 1336) BP: (105-109)/(68-74) 108/68 mmHg (03/12 1336) SpO2:  [97 %-99 %] 99 % (03/12 1336) Weight:  [187 lb 2.7 oz (84.9 kg)] 187 lb 2.7 oz (84.9 kg) (03/12 0555)  Last BM Date: 03/05/14   24-hour weight change: Weight change: 3 lb 8.4 oz (1.6 kg)  Weight trends: Filed Weights   03/09/14 0550 03/10/14 0605 03/11/14 0555  Weight: 182 lb 5.1 oz (82.7 kg) 183 lb 10.3 oz (83.3 kg) 187 lb 2.7 oz (84.9 kg)    Intake/Output:  03/11 0701 - 03/12 0700 In: 1200 [P.O.:1200] Out: 1250 [Urine:1250] Total I/O In: 240 [P.O.:240] Out: 650 [Urine:650]   Physical Exam: BP 108/68  Pulse 72  Temp(Src) 98.2 F (36.8 C) (Oral)  Resp 18  Ht 5' (1.524 m)  Wt 187 lb 2.7 oz (84.9 kg)  BMI 36.55 kg/m2  SpO2 99%  Wt Readings from Last 3 Encounters:  03/11/14 187 lb 2.7 oz (84.9 kg)  01/22/14 203 lb 4.2 oz (92.2 kg)  11/19/13 195 lb (88.451 kg)    General: Vital signs reviewed and noted.   Head: Normocephalic, atraumatic.  Eyes: conjunctivae/corneas clear.  EOM's intact.   Throat: normal  Neck:  normal   Lungs:    clear  Heart:  RR,   Abdomen:  Soft, non-tender, non-distended    Extremities: No edema   Neurologic: A&O X3, CN II - XII are grossly intact.   Psych: Normal     Labs: BMET:  Recent Labs  03/10/14 0500 03/11/14 0515  NA 136* 136*  K 5.1 5.1  CL 94* 94*  CO2 20 20  GLUCOSE 70 81  BUN 49* 59*  CREATININE 3.83* 4.49*  CALCIUM 9.6 9.3  PHOS  --  6.8*    Liver function tests:  Recent Labs  03/09/14 0415 03/11/14 0515  AST 28 49*  ALT 16 25  ALKPHOS 81 102  BILITOT 2.1* 2.5*  PROT 7.5 7.2  ALBUMIN 3.1* 3.1*   No  results found for this basename: LIPASE, AMYLASE,  in the last 72 hours  CBC:  Recent Labs  03/09/14 0415 03/11/14 0515  WBC 6.3 6.3  HGB 12.0 11.4*  HCT 35.8* 33.7*  MCV 68.2* 66.3*  PLT 280 248    Cardiac Enzymes:  Recent Labs  03/09/14 0415  TROPONINI <0.30    Coagulation Studies: No results found for this basename: LABPROT, INR,  in the last 72 hours    Other results:  Tele:  NSR at 81   Medications:    Infusions: . sodium chloride 75 mL/hr (03/10/14 1400)    Scheduled Medications: . enoxaparin (LOVENOX) injection  30 mg Subcutaneous Q24H  . insulin aspart  0-9 Units Subcutaneous TID WC  . pantoprazole  40 mg Oral Q1200  . sodium chloride  3 mL Intravenous Q12H    Assessment/ Plan:   Active Problems:   Diabetes mellitus type 2, controlled   Coronary artery disease   Dilated cardiomyopathy   Dehydration   Dyspnea   Heart failure   Chronic systolic CHF (congestive heart failure)   Nausea and  vomiting   AICD (automatic cardioverter/defibrillator) present   Sinus tachycardia   Renal failure, acute on chronic   1. CAD:  No angina,   2. Chronic systlic CHF:  Stable.    3. Acute renal insufficiency:  Discussed with dr. Briant Cedar.  He would like to hold bisoprolol for a day or so to see if she can perfuse her kidneys better. Will repeat her echo to make sure the LV function hasn't worsened.  At this point there is not much to offer from a cardiac standpoint.  Length of Stay: 3  Vesta Mixer, Montez Hageman., MD, [facility] 03/11/2014, 2:11 PM Office [phone] Pager [phone]

## 2014-03-11 NOTE — Progress Notes (Signed)
S: CO feeling tired and bil shoulder pain O:BP 105/71  Pulse 76  Temp(Src) 97.9 F (36.6 C) (Oral)  Resp 18  Ht 5' (1.524 m)  Wt 84.9 kg (187 lb 2.7 oz)  BMI 36.55 kg/m2  SpO2 97%  Intake/Output Summary (Last 24 hours) at 03/11/14 0804 Last data filed at 03/10/14 2200  Gross per 24 hour  Intake    840 ml  Output    950 ml  Net   -110 ml   Weight change: 1.6 kg (3 lb 8.4 oz) ELF:YBOFB and alert CVS:RRR 2/6 systolic M Resp:clear Abd: + BS NTND Ext: no edema.  FROM shoulders wo pain  Tender to palpation around shoulders NEURO:CNI Ox3 No asterixis    . enoxaparin (LOVENOX) injection  30 mg Subcutaneous Q24H  . insulin aspart  0-9 Units Subcutaneous TID WC  . pantoprazole (PROTONIX) IV  40 mg Intravenous Q24H  . sodium chloride  3 mL Intravenous Q12H   US Renal  03/10/2014   CLINICAL DATA Acute kidney injury  EXAM RENAL/URINARY TRACT ULTRASOUND COMPLETE  COMPARISON 01/20/2014  FINDINGS Right Kidney:  Length: 12.4 cm. Lobular contour. Echogenic renal parenchyma, suggesting medical renal disease. No mass or hydronephrosis.  Left Kidney:  Length: 11.5 cm. Lobular contour. Echogenic renal parenchyma, suggesting medical renal disease. No mass or hydronephrosis.  Bladder:  Underdistended but grossly unremarkable.  IMPRESSION Echogenic renal parenchyma, suggesting medical renal disease.  No hydronephrosis.  SIGNATURE  Electronically Signed   By: Charline Bills M.D.   On: 03/10/2014 17:11   BMET    Component Value Date/Time   NA 136* 03/11/2014 0515   K 5.1 03/11/2014 0515   CL 94* 03/11/2014 0515   CO2 20 03/11/2014 0515   GLUCOSE 81 03/11/2014 0515   BUN 59* 03/11/2014 0515   CREATININE 4.49* 03/11/2014 0515   CALCIUM 9.3 03/11/2014 0515   GFRNONAA 10* 03/11/2014 0515   GFRAA 11* 03/11/2014 0515   CBC    Component Value Date/Time   WBC 6.3 03/11/2014 0515   RBC 5.08 03/11/2014 0515   RBC 5.13* 04/26/2013 1804   HGB 11.4* 03/11/2014 0515   HCT 33.7* 03/11/2014 0515   PLT 248  03/11/2014 0515   MCV 66.3* 03/11/2014 0515   MCH 22.4* 03/11/2014 0515   MCHC 33.8 03/11/2014 0515   RDW 20.6* 03/11/2014 0515   LYMPHSABS 2.0 03/08/2014 0759   MONOABS 0.6 03/08/2014 0759   EOSABS 0.1 03/08/2014 0759   BASOSABS 0.1 03/08/2014 0759     Assessment: 1. Acute on CKD 3.  UO good but Scr higher.  Hopefully will level off in next day or 2.  Urine studies still not done.  Still think this is sec to poor renal blood flow.  FeNa was <1%. 2. Cardiomyopathy 3. Hepatic cirrhosis 4. DM 5. Shoulder pain seems muscular in origin  Plan: 1. Cont IV fluids today 2. Re check SCr in AM 3. Collect urine for uNa and CR   Cariann Kinnamon T

## 2014-03-11 NOTE — Progress Notes (Signed)
Echo Lab  2D Echocardiogram completed.  Carol Loftin L Udell Blasingame, RDCS 03/11/2014 3:13 PM

## 2014-03-12 LAB — GLUCOSE, CAPILLARY
Glucose-Capillary: 100 mg/dL — ABNORMAL HIGH (ref 70–99)
Glucose-Capillary: 110 mg/dL — ABNORMAL HIGH (ref 70–99)
Glucose-Capillary: 148 mg/dL — ABNORMAL HIGH (ref 70–99)
Glucose-Capillary: 149 mg/dL — ABNORMAL HIGH (ref 70–99)

## 2014-03-12 LAB — COMPREHENSIVE METABOLIC PANEL
ALBUMIN: 2.8 g/dL — AB (ref 3.5–5.2)
ALK PHOS: 95 U/L (ref 39–117)
ALT: 51 U/L — AB (ref 0–35)
AST: 132 U/L — AB (ref 0–37)
BILIRUBIN TOTAL: 2.7 mg/dL — AB (ref 0.3–1.2)
BUN: 64 mg/dL — ABNORMAL HIGH (ref 6–23)
CHLORIDE: 96 meq/L (ref 96–112)
CO2: 18 meq/L — AB (ref 19–32)
Calcium: 9 mg/dL (ref 8.4–10.5)
Creatinine, Ser: 4.49 mg/dL — ABNORMAL HIGH (ref 0.50–1.10)
GFR calc Af Amer: 11 mL/min — ABNORMAL LOW (ref >90)
GFR calc non Af Amer: 10 mL/min — ABNORMAL LOW (ref >90)
Glucose, Bld: 105 mg/dL — ABNORMAL HIGH (ref 70–99)
POTASSIUM: 5.1 meq/L (ref 3.7–5.3)
Sodium: 135 mEq/L — ABNORMAL LOW (ref 137–147)
Total Protein: 6.7 g/dL (ref 6.0–8.3)

## 2014-03-12 LAB — PROTEIN ELECTROPHORESIS, SERUM
ALBUMIN ELP: 46.8 % — AB (ref 55.8–66.1)
ALPHA-1-GLOBULIN: 5.2 % — AB (ref 2.9–4.9)
Alpha-2-Globulin: 11.4 % (ref 7.1–11.8)
BETA GLOBULIN: 6.3 % (ref 4.7–7.2)
Beta 2: 6.3 % (ref 3.2–6.5)
Gamma Globulin: 24 % — ABNORMAL HIGH (ref 11.1–18.8)
M-Spike, %: NOT DETECTED g/dL
Total Protein ELP: 6.9 g/dL (ref 6.0–8.3)

## 2014-03-12 LAB — URINE CULTURE: Colony Count: 90000

## 2014-03-12 LAB — PARATHYROID HORMONE, INTACT (NO CA): PTH: 299.4 pg/mL — ABNORMAL HIGH (ref 14.0–72.0)

## 2014-03-12 MED ORDER — FUROSEMIDE 10 MG/ML IJ SOLN
80.0000 mg | Freq: Two times a day (BID) | INTRAMUSCULAR | Status: DC
  Administered 2014-03-12 – 2014-03-15 (×7): 80 mg via INTRAVENOUS
  Filled 2014-03-12 (×10): qty 8

## 2014-03-12 MED ORDER — MILRINONE IN DEXTROSE 20 MG/100ML IV SOLN
0.1250 ug/kg/min | INTRAVENOUS | Status: DC
  Administered 2014-03-12 – 2014-03-18 (×9): 0.25 ug/kg/min via INTRAVENOUS
  Administered 2014-03-19: 0.125 ug/kg/min via INTRAVENOUS
  Filled 2014-03-12 (×11): qty 100

## 2014-03-12 NOTE — Progress Notes (Signed)
S:  Shoulder pain better.  Eating well O:BP 107/70  Pulse 82  Temp(Src) 97.7 F (36.5 C) (Oral)  Resp 18  Ht 5' (1.524 m)  Wt 86.9 kg (191 lb 9.3 oz)  BMI 37.42 kg/m2  SpO2 99%  Intake/Output Summary (Last 24 hours) at 03/12/14 0752 Last data filed at 03/12/14 4585  Gross per 24 hour  Intake   1620 ml  Output    901 ml  Net    719 ml   Weight change: 2 kg (4 lb 6.6 oz) FYT:WKMQK and alert CVS:RRR 2/6 systolic M Resp:clear Abd: + BS NTND Ext: no edema.   NEURO:CNI Ox3 No asterixis    . enoxaparin (LOVENOX) injection  30 mg Subcutaneous Q24H  . insulin aspart  0-9 Units Subcutaneous TID WC  . pantoprazole  40 mg Oral Q1200  . sodium chloride  3 mL Intravenous Q12H   US Renal  03/10/2014   CLINICAL DATA Acute kidney injury  EXAM RENAL/URINARY TRACT ULTRASOUND COMPLETE  COMPARISON 01/20/2014  FINDINGS Right Kidney:  Length: 12.4 cm. Lobular contour. Echogenic renal parenchyma, suggesting medical renal disease. No mass or hydronephrosis.  Left Kidney:  Length: 11.5 cm. Lobular contour. Echogenic renal parenchyma, suggesting medical renal disease. No mass or hydronephrosis.  Bladder:  Underdistended but grossly unremarkable.  IMPRESSION Echogenic renal parenchyma, suggesting medical renal disease.  No hydronephrosis.  SIGNATURE  Electronically Signed   By: Charline Bills M.D.   On: 03/10/2014 17:11   BMET    Component Value Date/Time   NA 135* 03/12/2014 0450   K 5.1 03/12/2014 0450   CL 96 03/12/2014 0450   CO2 18* 03/12/2014 0450   GLUCOSE 105* 03/12/2014 0450   BUN 64* 03/12/2014 0450   CREATININE 4.49* 03/12/2014 0450   CALCIUM 9.0 03/12/2014 0450   GFRNONAA 10* 03/12/2014 0450   GFRAA 11* 03/12/2014 0450   CBC    Component Value Date/Time   WBC 6.3 03/11/2014 0515   RBC 5.08 03/11/2014 0515   RBC 5.13* 04/26/2013 1804   HGB 11.4* 03/11/2014 0515   HCT 33.7* 03/11/2014 0515   PLT 248 03/11/2014 0515   MCV 66.3* 03/11/2014 0515   MCH 22.4* 03/11/2014 0515   MCHC 33.8  03/11/2014 0515   RDW 20.6* 03/11/2014 0515   LYMPHSABS 2.0 03/08/2014 0759   MONOABS 0.6 03/08/2014 0759   EOSABS 0.1 03/08/2014 0759   BASOSABS 0.1 03/08/2014 0759     Assessment: 1. Acute on CKD 3.  UO good but Scr higher.  Hopefully will level off in next day or 2.   Still think this is sec to poor renal blood flow.  FeNa was <1%.  Scr stable today and hopefully will start to decrease 2. Cardiomyopathy 3. Hepatic cirrhosis 4. DM   Plan: 1. DC IV fluids 2. Recheck Scr in AM   Mala Gibbard T

## 2014-03-12 NOTE — Progress Notes (Signed)
PROGRESS NOTE    Andrea [name] OTL:[id number] DOB: [birth year] DOA: 03/08/2014 PCP: Lolita Patella, MD  HPI/Brief narrative 63 year old female patient with history of ischemic dilated cardiomyopathy, ICD, CAD status post stenting, CVA, DM 2, intolerant of ACE inhibitors and ARB's, prolonged hospitalization in January for worsening liver failure and transferred to [facility] for consideration of liver transplant but not felt to be a candidate (note records available), chronic systolic CHF, HTN, dyslipidemia, stage III chronic kidney disease was admitted on 03/08/14 with complaints of nausea and nonbloody emesis. No sickly contacts. She also had some dyspnea.   Assessment/Plan:  1. Nausea and vomiting      -could have started out as gastroenetritis, now worsening likely due to worsening kidney function      -improved, advance diet  2. Acute on stage III chronic kidney disease: Baseline creatinine is not clear. She had normal creatinine on 1/18 which had gone up to 2.2 on 1/23. Admitting creatinine on 3/9 was 1.35->2.81->3.8 now ->4.49     -suspect multifactorial, suspect Cardio-renal vs Hepato renal syndrome     -strict I/Os     -Renal US: suggesting medical renal disease, no hydronephrosis     -Renal consult appreciated     -creatinine plateaued, Stop IVF  3. Chronic systolic CHF/ischemic dilated cardiomyopathy/CAD/ICD:       - No signs of volume overload at this time despite elevated BNP and 1 plus edema . Lasix held. EF 20% August 2014.       -ECho done results pending  4. Type II DM with nephropathy:       -with hypoglycemia      -improved, sensitive SSI      -monitor CBGs  5. History of liver failure: and cirrhosis based on USG done at [facility]       -LFTs unremarkable except for low albumin and mildly elevated bilirubin.       -Extensive unremarkable workup done last admission for liver failure which was unrevealing       -based on [facility] DC summary felt to be due to  Cardiomyopathy       -ammonia level is 50, continue lactulose  Code Status: Full Family Communication: None at bedside Disposition Plan: Home when medically stable   Consultants:  Cardiology  Procedures:  None  Antibiotics:  Rocephin 3/9 > 3/10   Subjective: More awake, no further N/V  Objective: Filed Vitals:   03/11/14 0555 03/11/14 1336 03/11/14 2102 03/12/14 0500  BP: 105/71 108/68 107/70 107/70  Pulse: 76 72 80 82  Temp: 97.9 F (36.6 C) 98.2 F (36.8 C) 97.3 F (36.3 C) 97.7 F (36.5 C)  TempSrc: Oral Oral Axillary Oral  Resp: 18 18 18 18   Height:      Weight: 84.9 kg (187 lb 2.7 oz)   86.9 kg (191 lb 9.3 oz)  SpO2: 97% 99% 100% 99%    Intake/Output Summary (Last 24 hours) at 03/12/14 1441 Last data filed at 03/12/14 1357  Gross per 24 hour  Intake   1863 ml  Output    901 ml  Net    962 ml   Filed Weights   03/10/14 0605 03/11/14 0555 03/12/14 0500  Weight: 83.3 kg (183 lb 10.3 oz) 84.9 kg (187 lb 2.7 oz) 86.9 kg (191 lb 9.3 oz)     Exam:  General exam: Moderately built and nourished female, lethargic, arousible but sleepy, answers most questions. Respiratory system: Clear. No increased work of breathing. Cardiovascular system: S1 & S2  heard, RRR. No JVD, murmurs, gallops, clicks or pedal edema. Telemetry: Sinus rhythm with occasional PVCs. Gastrointestinal system: Abdomen is nondistended, soft and nontender. Normal bowel sounds heard. Central nervous system: Alert and oriented. No focal neurological deficits. Ext: 1 plus edema  Extremities: Symmetric 5 x 5 power.   Data Reviewed: Basic Metabolic Panel:  Recent Labs Lab 03/09/14 0415 03/09/14 1225 03/10/14 0500 03/11/14 0515 03/12/14 0450  NA 137 136* 136* 136* 135*  K 5.2 5.1 5.1 5.1 5.1  CL 95* 95* 94* 94* 96  CO2 20 23 20 20  18*  GLUCOSE 101* 99 70 81 105*  BUN 33* 38* 49* 59* 64*  CREATININE 2.32* 2.81* 3.83* 4.49* 4.49*  CALCIUM 9.6 9.6 9.6 9.3 9.0  PHOS  --   --   --  6.8*   --    Liver Function Tests:  Recent Labs Lab 03/08/14 0759 03/09/14 0415 03/11/14 0515 03/12/14 0450  AST 25 28 49* 132*  ALT 16 16 25  51*  ALKPHOS 90 81 102 95  BILITOT 1.5* 2.1* 2.5* 2.7*  PROT 8.1 7.5 7.2 6.7  ALBUMIN 3.3* 3.1* 3.1* 2.8*    Recent Labs Lab 03/08/14 0759  LIPASE 25    Recent Labs Lab 03/08/14 0759 03/11/14 0930  AMMONIA 21 50   CBC:  Recent Labs Lab 03/08/14 0759 03/09/14 0415 03/11/14 0515  WBC 6.1 6.3 6.3  NEUTROABS 3.3  --   --   HGB 12.4 12.0 11.4*  HCT 37.3 35.8* 33.7*  MCV 68.6* 68.2* 66.3*  PLT 273 280 248   Cardiac Enzymes:  Recent Labs Lab 03/08/14 0759 03/09/14 0415  TROPONINI <0.30 <0.30   BNP (last 3 results)  Recent Labs  08/19/13 1024 09/07/13 0955 03/08/14 0759  PROBNP 1769.0* 40.0 2706.0*   CBG:  Recent Labs Lab 03/11/14 1121 03/11/14 1618 03/11/14 2058 03/12/14 0610 03/12/14 1103  GLUCAP 90 98 92 100* 110*    Recent Results (from the past 240 hour(s))  URINE CULTURE     Status: None   Collection Time    03/08/14  6:36 PM      Result Value Ref Range Status   Specimen Description URINE, RANDOM   Final   Special Requests NONE   Final   Culture  Setup Time     Final   Value: 03/09/2014 00:07     Performed at [facility]   Colony Count     Final   Value: 90,000 COLONIES/ML     Performed at [facility]   Culture     Final   Value: ENTEROCOCCUS SPECIES     Performed at [facility]   Report Status 03/12/2014 FINAL   Final   Organism ID, Bacteria ENTEROCOCCUS SPECIES   Final      Studies: [place]  03/10/2014   CLINICAL DATA Acute kidney injury  EXAM RENAL/URINARY TRACT ULTRASOUND COMPLETE  COMPARISON 01/20/2014  FINDINGS Right Kidney:  Length: 12.4 cm. Lobular contour. Echogenic renal parenchyma, suggesting medical renal disease. No mass or hydronephrosis.  Left Kidney:  Length: 11.5 cm. Lobular contour. Echogenic renal parenchyma, suggesting medical renal disease.  No mass or hydronephrosis.  Bladder:  Underdistended but grossly unremarkable.  IMPRESSION Echogenic renal parenchyma, suggesting medical renal disease.  No hydronephrosis.  SIGNATURE  Electronically Signed   By: Charline BillsSriyesh  Krishnan M.D.   On: 03/10/2014 17:11        Scheduled Meds: . enoxaparin (LOVENOX) injection  30 mg Subcutaneous Q24H  . furosemide  80 mg Intravenous BID  . insulin aspart  0-9 Units Subcutaneous TID WC  . pantoprazole  40 mg Oral Q1200  . sodium chloride  3 mL Intravenous Q12H   Continuous Infusions: . milrinone      Active Problems:   Diabetes mellitus type 2, controlled   Coronary artery disease   Dilated cardiomyopathy   Dehydration   Dyspnea   Heart failure   Chronic systolic CHF (congestive heart failure)   Nausea and vomiting   AICD (automatic cardioverter/defibrillator) present   Sinus tachycardia   Renal failure, acute on chronic  Time spent: 35 minutes  Chancy Claros, MD,  [facility] Pager [phone]  If 7PM-7AM, please contact night-coverage [email or web address] Password [email or web address] 03/12/2014, 2:41 PM    LOS: 4 days

## 2014-03-12 NOTE — Evaluation (Signed)
Physical Therapy Evaluation Patient Details Name: Andrea [name] MRN: [id number] DOB: 17-Feb-1951 Today's Date: 03/12/2014 Time: 2836-6294 PT Time Calculation (min): 16 min  PT Assessment / Plan / Recommendation History of Present Illness  63 year old female patient with history of ischemic dilated cardiomyopathy, ICD, CAD status post stenting, CVA, DM 2, intolerant of ACE inhibitors and ARB's, prolonged hospitalization in January for worsening liver failure and transferred to [facility] for consideration of liver transplant but not felt to be a candidate (note records available), chronic systolic CHF, HTN, dyslipidemia, stage III chronic kidney disease was admitted on 03/08/14 with complaints of nausea and nonbloody emesis. No sickly contacts. She also had some dyspnea.  Clinical Impression  Patient presents with problems listed below.  Will benefit from acute PT to maximize independence prior to discharge home.      PT Assessment  Patient needs continued PT services    Follow Up Recommendations  No PT follow up;Supervision - Intermittent    Does the patient have the potential to tolerate intense rehabilitation      Barriers to Discharge Decreased caregiver support Significant other works during day - patient alone most of the day.    Equipment Recommendations  None recommended by PT    Recommendations for Other Services     Frequency Min 3X/week    Precautions / Restrictions Precautions Precautions: None Restrictions Weight Bearing Restrictions: No   Pertinent Vitals/Pain Pain limiting mobility today - patient declined OOB activity.      Mobility  Bed Mobility Overal bed mobility: Modified Independent General bed mobility comments: Patient able to move supine <> sit with rail - no physical assist needed.    Exercises     PT Diagnosis: Difficulty walking;Generalized weakness;Acute pain  PT Problem List: Decreased strength;Decreased activity tolerance;Decreased  mobility;Cardiopulmonary status limiting activity;Pain PT Treatment Interventions: DME instruction;Gait training;Functional mobility training;Patient/family education     PT Goals(Current goals can be found in the care plan section) Acute Rehab PT Goals Patient Stated Goal: to be able to feel stronger PT Goal Formulation: With patient Time For Goal Achievement: 03/19/14 Potential to Achieve Goals: Good  Visit Information  Last PT Received On: 03/12/14 Assistance Needed: +1 History of Present Illness: 63 year old female patient with history of ischemic dilated cardiomyopathy, ICD, CAD status post stenting, CVA, DM 2, intolerant of ACE inhibitors and ARB's, prolonged hospitalization in January for worsening liver failure and transferred to [facility] for consideration of liver transplant but not felt to be a candidate (note records available), chronic systolic CHF, HTN, dyslipidemia, stage III chronic kidney disease was admitted on 03/08/14 with complaints of nausea and nonbloody emesis. No sickly contacts. She also had some dyspnea.       Prior Functioning  Home Living Family/patient expects to be discharged to:: Private residence Living Arrangements: Spouse/significant other Available Help at Discharge: Family;Available PRN/intermittently Type of Home: House Home Access: Stairs to enter [employer] of Steps: 1 Entrance Stairs-Rails: None Home Layout: One level Home Equipment: Walker - 2 wheels;Cane - single point Additional Comments: significant other works during the day. Pt states she spends much of the day in bed. Prior Function Level of Independence: Independent Communication Communication: No difficulties    Cognition  Cognition Arousal/Alertness: Awake/alert Behavior During Therapy: WFL for tasks assessed/performed Overall Cognitive Status: Within Functional Limits for tasks assessed    Extremity/Trunk Assessment Upper Extremity Assessment Upper Extremity  Assessment: Defer to OT evaluation Lower Extremity Assessment Lower Extremity Assessment: Generalized weakness   Balance Balance Overall balance assessment: No  apparent balance deficits (not formally assessed) Sitting-balance support: No upper extremity supported;Feet unsupported Sitting balance-Leahy Scale: Good  End of Session PT - End of Session Activity Tolerance: Patient limited by pain;Patient limited by fatigue Patient left: in bed;with call bell/phone within reach  GP     Vena AustriaDavis, Maryum Batterson H 03/12/2014, 7:02 PM Durenda HurtSusan H. Renaldo Fiddleravis, PT, [facility] Acute Rehab Services Pager [phone]

## 2014-03-12 NOTE — Evaluation (Signed)
Occupational Therapy Evaluation Patient Details Name: Andrea [name] MRN: [id number] DOB: July 07, 1951 Today's Date: 03/12/2014 Time: 1791-5056 OT Time Calculation (min): 30 min  OT Assessment / Plan / Recommendation History of present illness 63 year old female patient with history of ischemic dilated cardiomyopathy, ICD, CAD status post stenting, CVA, DM 2, intolerant of ACE inhibitors and ARB's, prolonged hospitalization in January for worsening liver failure and transferred to [facility] for consideration of liver transplant but not felt to be a candidate (note records available), chronic systolic CHF, HTN, dyslipidemia, stage III chronic kidney disease was admitted on 03/08/14 with complaints of nausea and nonbloody emesis. No sickly contacts. She also had some dyspnea.   Clinical Impression   Pt admitted with above.  Will continue to follow pt acutely in order to address below problem list.  Recommending HHOT to assess home safety and provide energy conservation strategies specific to her home setting.  No DME recommended at this time but may need to update to include 3n1 if pt does not progress.    OT Assessment  Patient needs continued OT Services    Follow Up Recommendations  Home health OT;Supervision - Intermittent    Barriers to Discharge Decreased caregiver support Pt is home alone during the day.  Equipment Recommendations  None recommended by OT    Recommendations for Other Services    Frequency  Min 2X/week    Precautions / Restrictions     Pertinent Vitals/Pain HR in 70s at rest and 80s with ambulation. The    ADL  Eating/Feeding: Performed;Independent Where Assessed - Eating/Feeding: Edge of bed Grooming: Performed;Wash/dry hands;Supervision/safety Where Assessed - Grooming: Unsupported sitting Lower Body Bathing: Simulated;Supervision/safety Where Assessed - Lower Body Bathing: Unsupported sitting Lower Body Dressing: Performed;Supervision/safety Where Assessed -  Lower Body Dressing: Unsupported sitting Toilet Transfer: Simulated;Min guard Toilet Transfer Method: Sit to Barista:  (bed) Equipment Used: Gait belt Transfers/Ambulation Related to ADLs: Min guard ambulating in room and hallway. Pt required sitting rest break x1 (2 minutes) due to feeling weak.  ADL Comments: Pt able to don socks by crossing ankles over knees.  She states that she feels wobbly when up on her feet.  Fatigues easily during functional mobility.  No LOB during ambulation in hallway.     OT Diagnosis: Generalized weakness  OT Problem List: Decreased strength;Decreased activity tolerance;Decreased knowledge of use of DME or AE OT Treatment Interventions: Self-care/ADL training;DME and/or AE instruction;Energy conservation;Therapeutic activities;Patient/family education   OT Goals(Current goals can be found in the care plan section) Acute Rehab OT Goals Patient Stated Goal: to be able to feel stronger OT Goal Formulation: With patient Time For Goal Achievement: 03/19/14 Potential to Achieve Goals: Good  Visit Information  Last OT Received On: 03/12/14 Assistance Needed: +1 History of Present Illness: 63 year old female patient with history of ischemic dilated cardiomyopathy, ICD, CAD status post stenting, CVA, DM 2, intolerant of ACE inhibitors and ARB's, prolonged hospitalization in January for worsening liver failure and transferred to [facility] for consideration of liver transplant but not felt to be a candidate (note records available), chronic systolic CHF, HTN, dyslipidemia, stage III chronic kidney disease was admitted on 03/08/14 with complaints of nausea and nonbloody emesis. No sickly contacts. She also had some dyspnea.       Prior Functioning     Home Living Family/patient expects to be discharged to:: Private residence Living Arrangements: Spouse/significant other Available Help at Discharge: Family;Available PRN/intermittently Type of Home:  House Home Access: Stairs to enter [employer]  of Steps: 1 Home Layout: One level Home Equipment: Walker - 2 wheels Additional Comments: significant other works during the day. Pt states she spends much of the day in bed. Prior Function Level of Independence: Independent Communication Communication: No difficulties         Vision/Perception     Cognition  Cognition Arousal/Alertness: Awake/alert Behavior During Therapy: WFL for tasks assessed/performed Overall Cognitive Status: Within Functional Limits for tasks assessed    Extremity/Trunk Assessment Upper Extremity Assessment Upper Extremity Assessment: Overall WFL for tasks assessed     Mobility Bed Mobility Overal bed mobility:  (not assessed- pt in sitting EOB) Transfers Overall transfer level: Needs assistance Equipment used: None Transfers: Sit to/from Stand Sit to Stand: Supervision General transfer comment: Supervision for safety     Exercise     Balance     End of Session OT - End of Session Equipment Utilized During Treatment: Gait belt Activity Tolerance: Patient limited by fatigue Patient left: in bed;with nursing/sitter in room;with call bell/phone within reach Nurse Communication: Mobility status  GO    03/12/2014 Cipriano MileJohnson, Yedidya Duddy Elizabeth OTR/L Pager [phone] Office [phone]  Cipriano MileJohnson, Vishal Sandlin Elizabeth 03/12/2014, 2:12 PM

## 2014-03-12 NOTE — Progress Notes (Addendum)
PROGRESS NOTE  Subjective:   Pt was admitted with nausea, vomiting, has hx of CAD , CHF and has an ICD. Her creatinine has continued to climb   I spoke with Dr. Gala RomneyBensimhon today. He suggested milrinone trial.  Objective:    Vital Signs:   Temp:  [97.3 F (36.3 C)-97.7 F (36.5 C)] 97.7 F (36.5 C) (03/13 0500) Pulse Rate:  [80-82] 82 (03/13 0500) Resp:  [18] 18 (03/13 0500) BP: (107)/(70) 107/70 mmHg (03/13 0500) SpO2:  [99 %-100 %] 99 % (03/13 0500) Weight:  [191 lb 9.3 oz (86.9 kg)] 191 lb 9.3 oz (86.9 kg) (03/13 0500)  Last BM Date: 03/10/14   24-hour weight change: Weight change: 4 lb 6.6 oz (2 kg)  Weight trends: Filed Weights   03/10/14 0605 03/11/14 0555 03/12/14 0500  Weight: 183 lb 10.3 oz (83.3 kg) 187 lb 2.7 oz (84.9 kg) 191 lb 9.3 oz (86.9 kg)    Intake/Output:  03/12 0701 - 03/13 0700 In: 1860 [P.O.:960; I.V.:900] Out: 1551 [Urine:1550; Stool:1] Total I/O In: 240 [P.O.:240] Out: -    Physical Exam: BP 107/70  Pulse 82  Temp(Src) 97.7 F (36.5 C) (Oral)  Resp 18  Ht 5' (1.524 m)  Wt 191 lb 9.3 oz (86.9 kg)  BMI 37.42 kg/m2  SpO2 99%  Wt Readings from Last 3 Encounters:  03/12/14 191 lb 9.3 oz (86.9 kg)  01/22/14 203 lb 4.2 oz (92.2 kg)  11/19/13 195 lb (88.451 kg)    General: Vital signs reviewed and noted.   Head: Normocephalic, atraumatic.  Eyes: conjunctivae/corneas clear.  EOM's intact.   Throat: normal  Neck:  normal   Lungs:    clear  Heart:  RR,   Abdomen:  Soft, non-tender, non-distended    Extremities: 1-2 + edema    Neurologic: A&O X3, CN II - XII are grossly intact.   Psych: Normal     Labs: BMET:  Recent Labs  03/11/14 0515 03/12/14 0450  NA 136* 135*  K 5.1 5.1  CL 94* 96  CO2 20 18*  GLUCOSE 81 105*  BUN 59* 64*  CREATININE 4.49* 4.49*  CALCIUM 9.3 9.0  PHOS 6.8*  --     Liver function tests:  Recent Labs  03/11/14 0515 03/12/14 0450  AST 49* 132*  ALT 25 51*  ALKPHOS 102 95  BILITOT  2.5* 2.7*  PROT 7.2 6.7  ALBUMIN 3.1* 2.8*   No results found for this basename: LIPASE, AMYLASE,  in the last 72 hours  CBC:  Recent Labs  03/11/14 0515  WBC 6.3  HGB 11.4*  HCT 33.7*  MCV 66.3*  PLT 248    Cardiac Enzymes: No results found for this basename: CKTOTAL, CKMB, TROPONINI,  in the last 72 hours  Coagulation Studies: No results found for this basename: LABPROT, INR,  in the last 72 hours    Other results:  Tele:  NSR at 81   Medications:    Infusions: . milrinone      Scheduled Medications: . enoxaparin (LOVENOX) injection  30 mg Subcutaneous Q24H  . insulin aspart  0-9 Units Subcutaneous TID WC  . pantoprazole  40 mg Oral Q1200  . sodium chloride  3 mL Intravenous Q12H    Assessment/ Plan:   Active Problems:   Diabetes mellitus type 2, controlled   Coronary artery disease   Dilated cardiomyopathy   Dehydration   Dyspnea   Heart failure   Chronic systolic CHF (congestive heart failure)  Nausea and vomiting   AICD (automatic cardioverter/defibrillator) present   Sinus tachycardia   Renal failure, acute on chronic   1. CAD:  No angina,   2. Chronic systlic CHF:  She has a general lack of energy.  Her Echo shows an EF of 15%.  Her symptoms could be due to lack of forward flow.    Creatinine continues to climb.  I have reviewed with Dr. Gala Romney - he recommends trial of Milrinone 0.25 mcg/kg/min and IV lasix.  If she become hypotensive, she will need to be transferred to CCU for swan Ganz placement.  3. Acute renal insufficiency:      Length of Stay: 4  Vesta Mixer, Montez Hageman., MD, [facility] 03/12/2014, 1:52 PM Office [phone] Pager [phone]

## 2014-03-13 DIAGNOSIS — I5021 Acute systolic (congestive) heart failure: Secondary | ICD-10-CM

## 2014-03-13 DIAGNOSIS — N185 Chronic kidney disease, stage 5: Secondary | ICD-10-CM

## 2014-03-13 LAB — COMPREHENSIVE METABOLIC PANEL
ALK PHOS: 102 U/L (ref 39–117)
ALT: 59 U/L — ABNORMAL HIGH (ref 0–35)
AST: 136 U/L — ABNORMAL HIGH (ref 0–37)
Albumin: 2.8 g/dL — ABNORMAL LOW (ref 3.5–5.2)
BILIRUBIN TOTAL: 2.3 mg/dL — AB (ref 0.3–1.2)
BUN: 64 mg/dL — ABNORMAL HIGH (ref 6–23)
CHLORIDE: 101 meq/L (ref 96–112)
CO2: 22 mEq/L (ref 19–32)
Calcium: 9 mg/dL (ref 8.4–10.5)
Creatinine, Ser: 4.17 mg/dL — ABNORMAL HIGH (ref 0.50–1.10)
GFR calc non Af Amer: 11 mL/min — ABNORMAL LOW (ref >90)
GFR, EST AFRICAN AMERICAN: 12 mL/min — AB (ref >90)
GLUCOSE: 154 mg/dL — AB (ref 70–99)
Potassium: 4.4 mEq/L (ref 3.7–5.3)
SODIUM: 139 meq/L (ref 137–147)
TOTAL PROTEIN: 6.5 g/dL (ref 6.0–8.3)

## 2014-03-13 LAB — GLUCOSE, CAPILLARY
GLUCOSE-CAPILLARY: 165 mg/dL — AB (ref 70–99)
Glucose-Capillary: 122 mg/dL — ABNORMAL HIGH (ref 70–99)
Glucose-Capillary: 155 mg/dL — ABNORMAL HIGH (ref 70–99)
Glucose-Capillary: 119 mg/dL — ABNORMAL HIGH (ref 70–99)

## 2014-03-13 MED ORDER — CALCITRIOL 0.25 MCG PO CAPS
0.2500 ug | ORAL_CAPSULE | Freq: Every day | ORAL | Status: DC
  Administered 2014-03-13 – 2014-03-24 (×12): 0.25 ug via ORAL
  Filled 2014-03-13 (×12): qty 1

## 2014-03-13 NOTE — Progress Notes (Signed)
Subjective: NO CP  Breathing is stable   Objective: Filed Vitals:   03/12/14 0500 03/12/14 1458 03/12/14 1750 03/13/14 0500  BP: 107/70 125/86 105/58 122/83  Pulse: 82 83  87  Temp: 97.7 F (36.5 C) 97.9 F (36.6 C)  97.4 F (36.3 C)  TempSrc: Oral Oral  Oral  Resp: 18 18  18   Height:      Weight: 191 lb 9.3 oz (86.9 kg)   189 lb 13.1 oz (86.1 kg)  SpO2: 99% 100%  99%   Weight change: -1 lb 12.2 oz (-0.8 kg)  Intake/Output Summary (Last 24 hours) at 03/13/14 1032 Last data filed at 03/13/14 1019  Gross per 24 hour  Intake    723 ml  Output    500 ml  Net    223 ml    General: Alert, awake, oriented x3, in no acute distress Neck:  JVP is increased   Heart: Regular rate and rhythm, without murmurs, rubs, gallops.  Lungs: Clear to auscultation.  No rales or wheezes. Exemities:  2+ edema.   Neuro: Grossly intact, nonfocal.  Tele:  SR  Lab Results: Results for orders placed during the hospital encounter of 03/08/14 (from the past 24 hour(s))  GLUCOSE, CAPILLARY     Status: Abnormal   Collection Time    03/12/14 11:03 AM      Result Value Ref Range   Glucose-Capillary 110 (*) 70 - 99 mg/dL  GLUCOSE, CAPILLARY     Status: Abnormal   Collection Time    03/12/14  4:02 PM      Result Value Ref Range   Glucose-Capillary 148 (*) 70 - 99 mg/dL  GLUCOSE, CAPILLARY     Status: Abnormal   Collection Time    03/12/14  9:57 PM      Result Value Ref Range   Glucose-Capillary 149 (*) 70 - 99 mg/dL  COMPREHENSIVE METABOLIC PANEL     Status: Abnormal   Collection Time    03/13/14  6:00 AM      Result Value Ref Range   Sodium 139  137 - 147 mEq/L   Potassium 4.4  3.7 - 5.3 mEq/L   Chloride 101  96 - 112 mEq/L   CO2 22  19 - 32 mEq/L   Glucose, Bld 154 (*) 70 - 99 mg/dL   BUN 64 (*) 6 - 23 mg/dL   Creatinine, Ser 2.57 (*) 0.50 - 1.10 mg/dL   Calcium 9.0  8.4 - 50.5 mg/dL   Total Protein 6.5  6.0 - 8.3 g/dL   Albumin 2.8 (*) 3.5 - 5.2 g/dL   AST 183 (*) 0 - 37 U/L   ALT 59  (*) 0 - 35 U/L   Alkaline Phosphatase 102  39 - 117 U/L   Total Bilirubin 2.3 (*) 0.3 - 1.2 mg/dL   GFR calc non Af Amer 11 (*) >90 mL/min   GFR calc Af Amer 12 (*) >90 mL/min  GLUCOSE, CAPILLARY     Status: Abnormal   Collection Time    03/13/14  6:46 AM      Result Value Ref Range   Glucose-Capillary 122 (*) 70 - 99 mg/dL    Studies/Results: @RISRSLT24 @  Medications: Reviewed   @PROBHOSP @  1.  Acute on chronic systolic CHF  LVEF 15%  Patient now on milrinine Will follow today re urine output  No changes today  2.  CKD stage IV  Appreciate renal following  Continue durrent regimen  3.  CAD  No evid for acute ischemia.      LOS: 5 days   Dietrich Patesaula Ross 03/13/2014, 10:32 AM

## 2014-03-13 NOTE — Progress Notes (Signed)
Physical Therapy Treatment Patient Details Name: Andrea [name] MRN: [id number] DOB: [birth year] Today's Date: 03/13/2014 Time: 9242-6834 PT Time Calculation (min): 15 min  PT Assessment / Plan / Recommendation  History of Present Illness 62 year old female patient with history of ischemic dilated cardiomyopathy, ICD, CAD status post stenting, CVA, DM 2, intolerant of ACE inhibitors and ARB's, prolonged hospitalization in January for worsening liver failure and transferred to [facility] for consideration of liver transplant but not felt to be a candidate (note records available), chronic systolic CHF, HTN, dyslipidemia, stage III chronic kidney disease was admitted on 03/08/14 with complaints of nausea and nonbloody emesis. No sickly contacts. She also had some dyspnea.   PT Comments   Patient doing fairly well with ambulation.  Slow, guarded gait pattern.  Do not anticipate any f/u PT needs at discharge.  Follow Up Recommendations  No PT follow up;Supervision - Intermittent     Does the patient have the potential to tolerate intense rehabilitation     Barriers to Discharge        Equipment Recommendations  None recommended by PT    Recommendations for Other Services    Frequency Min 3X/week   Progress towards PT Goals Progress towards PT goals: Progressing toward goals  Plan Current plan remains appropriate    Precautions / Restrictions Precautions Precautions: None Restrictions Weight Bearing Restrictions: No   Pertinent Vitals/Pain     Mobility  Bed Mobility Overal bed mobility: Modified Independent General bed mobility comments: Patient able to move supine <> sit with rail - no physical assist needed. Transfers Overall transfer level: Needs assistance Equipment used: None Transfers: Sit to/from Stand Sit to Stand: Supervision General transfer comment: Supervision for safety Ambulation/Gait Ambulation/Gait assistance: Supervision Ambulation Distance (Feet): 48  Feet Assistive device: None (Pushing IV pole) Gait Pattern/deviations: Step-through pattern;Decreased stride length;Shuffle Gait velocity: Slow Gait velocity interpretation: Below normal speed for age/gender General Gait Details: Verbal cues to move at a safe pace.  Patient with slow guarded gait.  Slight decrease in balance - patient using IV pole in room.      PT Goals (current goals can now be found in the care plan section)    Visit Information  Last PT Received On: 03/13/14 Assistance Needed: +1 History of Present Illness: 63 year old female patient with history of ischemic dilated cardiomyopathy, ICD, CAD status post stenting, CVA, DM 2, intolerant of ACE inhibitors and ARB's, prolonged hospitalization in January for worsening liver failure and transferred to [facility] for consideration of liver transplant but not felt to be a candidate (note records available), chronic systolic CHF, HTN, dyslipidemia, stage III chronic kidney disease was admitted on 03/08/14 with complaints of nausea and nonbloody emesis. No sickly contacts. She also had some dyspnea.    Subjective Data  Subjective: "My feet itch.  They are so swollen"   Cognition  Cognition Arousal/Alertness: Awake/alert Behavior During Therapy: WFL for tasks assessed/performed Overall Cognitive Status: Within Functional Limits for tasks assessed    Balance     End of Session PT - End of Session Equipment Utilized During Treatment: Gait belt Activity Tolerance: Patient limited by fatigue Patient left: in bed;with call bell/phone within reach Nurse Communication: Mobility status   GP     Vena Austria 03/13/2014, 6:17 PM Durenda Hurt. Renaldo Fiddler, [facility] Acute Rehab Services Pager [phone]'

## 2014-03-13 NOTE — Progress Notes (Signed)
Pharmacist Heart Failure Core Measure Documentation  Assessment: Andrea [name] has an EF documented as 15% on ehco 03/11/14.  Rationale: Heart failure patients with left ventricular systolic dysfunction (LVSD) and an EF < 40% should be prescribed an angiotensin converting enzyme inhibitor (ACEI) or angiotensin receptor blocker (ARB) at discharge unless a contraindication is documented in the medical record.  This patient is not currently on an ACEI or ARB for HF.  This note is being placed in the record in order to provide documentation that a contraindication to the use of these agents is present for this encounter.  ACE Inhibitor or Angiotensin Receptor Blocker is contraindicated (specify all that apply)  []   ACEI allergy AND ARB allergy []   Angioedema []   Moderate or severe aortic stenosis []   Hyperkalemia []   Hypotension []   Renal artery stenosis [x]   Worsening renal function, preexisting renal disease or dysfunction   Severiano Gilbert 03/13/2014 12:07 PM

## 2014-03-13 NOTE — Progress Notes (Signed)
S:  No new CO.  Now on milrinone gtt O:BP 105/58  Pulse 83  Temp(Src) 97.9 F (36.6 C) (Oral)  Resp 18  Ht 5' (1.524 m)  Wt 86.1 kg (189 lb 13.1 oz)  BMI 37.07 kg/m2  SpO2 100%  Intake/Output Summary (Last 24 hours) at 03/13/14 0709 Last data filed at 03/12/14 1750  Gross per 24 hour  Intake    723 ml  Output      0 ml  Net    723 ml   Weight change: -0.8 kg (-1 lb 12.2 oz) CEY:EMVVK and alert CVS:RRR 2/6 systolic M Resp:clear Abd: + BS NTND Ext: trace edema.  RT PICC  NEURO:CNI Ox3 No asterixis    . enoxaparin (LOVENOX) injection  30 mg Subcutaneous Q24H  . furosemide  80 mg Intravenous BID  . insulin aspart  0-9 Units Subcutaneous TID WC  . pantoprazole  40 mg Oral Q1200  . sodium chloride  3 mL Intravenous Q12H   No results found. BMET    Component Value Date/Time   NA 135* 03/12/2014 0450   K 5.1 03/12/2014 0450   CL 96 03/12/2014 0450   CO2 18* 03/12/2014 0450   GLUCOSE 105* 03/12/2014 0450   BUN 64* 03/12/2014 0450   CREATININE 4.49* 03/12/2014 0450   CALCIUM 9.0 03/12/2014 0450   GFRNONAA 10* 03/12/2014 0450   GFRAA 11* 03/12/2014 0450   CBC    Component Value Date/Time   WBC 6.3 03/11/2014 0515   RBC 5.08 03/11/2014 0515   RBC 5.13* 04/26/2013 1804   HGB 11.4* 03/11/2014 0515   HCT 33.7* 03/11/2014 0515   PLT 248 03/11/2014 0515   MCV 66.3* 03/11/2014 0515   MCH 22.4* 03/11/2014 0515   MCHC 33.8 03/11/2014 0515   RDW 20.6* 03/11/2014 0515   LYMPHSABS 2.0 03/08/2014 0759   MONOABS 0.6 03/08/2014 0759   EOSABS 0.1 03/08/2014 0759   BASOSABS 0.1 03/08/2014 0759     Assessment: 1. Acute on CKD 3.  UO good but Scr higher.  Hopefully will level off in next day or 2.   Still think this is sec to poor renal blood flow.  FeNa was <1%.  Scr stable today and hopefully will start to decrease 2. Cardiomyopathy 3. Hepatic cirrhosis 4. DM 5. Sec HPTH  PTH level 299   Plan: 1. Labs pending 2. Given her poor EF, she very well may need HD in near future.  I discussed this with  her.  She says she knows what HD is and will think about it.  If HD is to be considered then a PICC line is not the best option 3. Recheck labs in AM 4. Start calcitriol Andrea [name] T

## 2014-03-13 NOTE — Progress Notes (Signed)
PROGRESS NOTE    Andrea [name]:[id number] DOB: 04-30-1951 DOA: 03/08/2014 PCP: Lolita Patella, MD  HPI/Brief narrative 63 year old female patient with history of ischemic dilated cardiomyopathy, ICD, CAD status post stenting, CVA, DM 2, intolerant of ACE inhibitors and ARB's, prolonged hospitalization in January for worsening liver failure and transferred to [facility] for consideration of liver transplant but not felt to be a candidate (note records available), chronic systolic CHF, HTN, dyslipidemia, stage III chronic kidney disease was admitted on 03/08/14 with complaints of nausea and nonbloody emesis. No sickly contacts. She also had some dyspnea.   Assessment/Plan:  1. Nausea and vomiting      -could have started out as gastroenetritis, now worsening likely due to worsening kidney function      -improved, advance diet  2. Acute on stage III chronic kidney disease: Baseline creatinine is not clear. She had normal creatinine on 1/18 which had gone up to 2.2 on 1/23. Admitting creatinine on 3/9 was 1.35->2.81->3.8 now ->4.49->4.1     -suspect multifactorial, suspect Primarily Cardio-renal      -strict I/Os     -Renal US: suggesting medical renal disease, no hydronephrosis     -Renal following     -creatinine plateaued, Stopped IVF 3/13  3. Chronic systolic CHF/ischemic dilated cardiomyopathy/CAD/ICD:       - No signs of volume overload at this time despite elevated BNP and 1 plus edema . Lasix held. EF 20% August 2014.       -ECho done EF 15% now       -Poor output state, started on IV milrinone per cards  4. Type II DM with nephropathy:       -with hypoglycemia      -improved, sensitive SSI      -monitor CBGs  5. History of liver failure: and cirrhosis based on USG done at [facility]       -LFTs unremarkable except for low albumin and mildly elevated bilirubin.       -Extensive unremarkable workup done last admission for liver failure which was unrevealing       -based on  [facility] DC summary felt to be due to Cardiomyopathy       -ammonia level is 50, continue lactulose       -LFTS stable  Code Status: Full Family Communication: None at bedside Disposition Plan: Home when medically stable   Consultants:  Cardiology  Procedures:  None  Antibiotics:  Rocephin 3/9 > 3/10   Subjective: Tired, some vomiting yesterday, PO intake so -so  Objective: Filed Vitals:   03/12/14 0500 03/12/14 1458 03/12/14 1750 03/13/14 0500  BP: 107/70 125/86 105/58 122/83  Pulse: 82 83  87  Temp: 97.7 F (36.5 C) 97.9 F (36.6 C)  97.4 F (36.3 C)  TempSrc: Oral Oral  Oral  Resp: 18 18  18   Height:      Weight: 86.9 kg (191 lb 9.3 oz)   86.1 kg (189 lb 13.1 oz)  SpO2: 99% 100%  99%    Intake/Output Summary (Last 24 hours) at 03/13/14 1147 Last data filed at 03/13/14 1019  Gross per 24 hour  Intake    723 ml  Output    500 ml  Net    223 ml   Filed Weights   03/11/14 0555 03/12/14 0500 03/13/14 0500  Weight: 84.9 kg (187 lb 2.7 oz) 86.9 kg (191 lb 9.3 oz) 86.1 kg (189 lb 13.1 oz)     Exam:  General exam: Moderately built  and nourished female, lethargic, arousible but sleepy, answers most questions. Respiratory system: Clear. No increased work of breathing. Cardiovascular system: S1 & S2 heard, RRR. No JVD, murmurs, gallops, clicks or pedal edema. Telemetry: Sinus rhythm with occasional PVCs. Gastrointestinal system: Abdomen is nondistended, soft and nontender. Normal bowel sounds heard. Central nervous system: Alert and oriented. No focal neurological deficits. Ext: 1 plus edema  Extremities: Symmetric 5 x 5 power.   Data Reviewed: Basic Metabolic Panel:  Recent Labs Lab 03/09/14 1225 03/10/14 0500 03/11/14 0515 03/12/14 0450 03/13/14 0600  NA 136* 136* 136* 135* 139  K 5.1 5.1 5.1 5.1 4.4  CL 95* 94* 94* 96 101  CO2 23 20 20  18* 22  GLUCOSE 99 70 81 105* 154*  BUN 38* 49* 59* 64* 64*  CREATININE 2.81* 3.83* 4.49* 4.49* 4.17*  CALCIUM  9.6 9.6 9.3 9.0 9.0  PHOS  --   --  6.8*  --   --    Liver Function Tests:  Recent Labs Lab 03/08/14 0759 03/09/14 0415 03/11/14 0515 03/12/14 0450 03/13/14 0600  AST 25 28 49* 132* 136*  ALT 16 16 25  51* 59*  ALKPHOS 90 81 102 95 102  BILITOT 1.5* 2.1* 2.5* 2.7* 2.3*  PROT 8.1 7.5 7.2 6.7 6.5  ALBUMIN 3.3* 3.1* 3.1* 2.8* 2.8*    Recent Labs Lab 03/08/14 0759  LIPASE 25    Recent Labs Lab 03/08/14 0759 03/11/14 0930  AMMONIA 21 50   CBC:  Recent Labs Lab 03/08/14 0759 03/09/14 0415 03/11/14 0515  WBC 6.1 6.3 6.3  NEUTROABS 3.3  --   --   HGB 12.4 12.0 11.4*  HCT 37.3 35.8* 33.7*  MCV 68.6* 68.2* 66.3*  PLT 273 280 248   Cardiac Enzymes:  Recent Labs Lab 03/08/14 0759 03/09/14 0415  TROPONINI <0.30 <0.30   BNP (last 3 results)  Recent Labs  08/19/13 1024 09/07/13 0955 03/08/14 0759  PROBNP 1769.0* 40.0 2706.0*   CBG:  Recent Labs Lab 03/12/14 1103 03/12/14 1602 03/12/14 2157 03/13/14 0646 03/13/14 1117  GLUCAP 110* 148* 149* 122* 155*    Recent Results (from the past 240 hour(s))  URINE CULTURE     Status: None   Collection Time    03/08/14  6:36 PM      Result Value Ref Range Status   Specimen Description URINE, RANDOM   Final   Special Requests NONE   Final   Culture  Setup Time     Final   Value: 03/09/2014 00:07     Performed at [facility]   Colony Count     Final   Value: 90,000 COLONIES/ML     Performed at [facility]   Culture     Final   Value: ENTEROCOCCUS SPECIES     Performed at [facility]   Report Status 03/12/2014 FINAL   Final   Organism ID, Bacteria ENTEROCOCCUS SPECIES   Final      Studies: No results found.      Scheduled Meds: . calcitRIOL  0.25 mcg Oral Daily  . enoxaparin (LOVENOX) injection  30 mg Subcutaneous Q24H  . furosemide  80 mg Intravenous BID  . insulin aspart  0-9 Units Subcutaneous TID WC  . pantoprazole  40 mg Oral Q1200  . sodium chloride  3 mL  Intravenous Q12H   Continuous Infusions: . milrinone 0.25 mcg/kg/min (03/13/14 [id number])    Active Problems:   Diabetes mellitus type 2, controlled   Coronary artery  disease   Dilated cardiomyopathy   Dehydration   Dyspnea   Heart failure   Chronic systolic CHF (congestive heart failure)   Nausea and vomiting   AICD (automatic cardioverter/defibrillator) present   Sinus tachycardia   Renal failure, acute on chronic  Time spent: 35 minutes  Jasiah Elsen, MD,  [facility] Pager [phone]  If 7PM-7AM, please contact night-coverage [email or web address] Password [facility] 03/13/2014, 11:47 AM    LOS: 5 days

## 2014-03-14 DIAGNOSIS — I259 Chronic ischemic heart disease, unspecified: Secondary | ICD-10-CM

## 2014-03-14 LAB — RENAL FUNCTION PANEL
Albumin: 2.9 g/dL — ABNORMAL LOW (ref 3.5–5.2)
BUN: 60 mg/dL — ABNORMAL HIGH (ref 6–23)
CHLORIDE: 98 meq/L (ref 96–112)
CO2: 24 meq/L (ref 19–32)
CREATININE: 3.41 mg/dL — AB (ref 0.50–1.10)
Calcium: 8.9 mg/dL (ref 8.4–10.5)
GFR calc non Af Amer: 13 mL/min — ABNORMAL LOW (ref >90)
GFR, EST AFRICAN AMERICAN: 16 mL/min — AB (ref >90)
GLUCOSE: 119 mg/dL — AB (ref 70–99)
Phosphorus: 2.8 mg/dL (ref 2.3–4.6)
Potassium: 4.5 mEq/L (ref 3.7–5.3)
Sodium: 136 mEq/L — ABNORMAL LOW (ref 137–147)

## 2014-03-14 LAB — GLUCOSE, CAPILLARY
GLUCOSE-CAPILLARY: 118 mg/dL — AB (ref 70–99)
GLUCOSE-CAPILLARY: 175 mg/dL — AB (ref 70–99)
Glucose-Capillary: 181 mg/dL — ABNORMAL HIGH (ref 70–99)
Glucose-Capillary: 107 mg/dL — ABNORMAL HIGH (ref 70–99)

## 2014-03-14 MED ORDER — HYDROCERIN EX CREA
TOPICAL_CREAM | Freq: Two times a day (BID) | CUTANEOUS | Status: DC
  Administered 2014-03-14 (×2): via TOPICAL
  Administered 2014-03-14: 1 via TOPICAL
  Administered 2014-03-15 – 2014-03-24 (×17): via TOPICAL
  Filled 2014-03-14: qty 113

## 2014-03-14 NOTE — Progress Notes (Signed)
S:  CO pruritis and edema O:BP 135/76  Pulse 99  Temp(Src) 98.9 F (37.2 C) (Oral)  Resp 20  Ht 5' (1.524 m)  Wt 84.142 kg (185 lb 8 oz)  BMI 36.23 kg/m2  SpO2 99%  Intake/Output Summary (Last 24 hours) at 03/14/14 0728 Last data filed at 03/14/14 0500  Gross per 24 hour  Intake   1240 ml  Output   5350 ml  Net  -4110 ml   Weight change: -1.958 kg (-4 lb 5.1 oz) MAU:QJFHL and alert CVS:RRR 2/6 systolic M Resp:clear Abd: + BS NTND Ext:1-2+  edema.  RT PICC  NEURO:CNI Ox3 No asterixis    . calcitRIOL  0.25 mcg Oral Daily  . enoxaparin (LOVENOX) injection  30 mg Subcutaneous Q24H  . furosemide  80 mg Intravenous BID  . hydrocerin   Topical BID  . insulin aspart  0-9 Units Subcutaneous TID WC  . pantoprazole  40 mg Oral Q1200  . sodium chloride  3 mL Intravenous Q12H   No results found. BMET    Component Value Date/Time   NA 136* 03/14/2014 0610   K 4.5 03/14/2014 0610   CL 98 03/14/2014 0610   CO2 24 03/14/2014 0610   GLUCOSE 119* 03/14/2014 0610   BUN 60* 03/14/2014 0610   CREATININE 3.41* 03/14/2014 0610   CALCIUM 8.9 03/14/2014 0610   GFRNONAA 13* 03/14/2014 0610   GFRAA 16* 03/14/2014 0610   CBC    Component Value Date/Time   WBC 6.3 03/11/2014 0515   RBC 5.08 03/11/2014 0515   RBC 5.13* 04/26/2013 1804   HGB 11.4* 03/11/2014 0515   HCT 33.7* 03/11/2014 0515   PLT 248 03/11/2014 0515   MCV 66.3* 03/11/2014 0515   MCH 22.4* 03/11/2014 0515   MCHC 33.8 03/11/2014 0515   RDW 20.6* 03/11/2014 0515   LYMPHSABS 2.0 03/08/2014 0759   MONOABS 0.6 03/08/2014 0759   EOSABS 0.1 03/08/2014 0759   BASOSABS 0.1 03/08/2014 0759     Assessment: 1. Acute on CKD 3. Most likely sec poor CO.  Scr trending down and UO increasing on Milrinone 2. Cardiomyopathy EF 15% 3. Hepatic cirrhosis 4. DM 5. Sec HPTH  PTH level 299. On calcitriol   Plan: 1.   Overall she is a poor HD candidate but she would consider it if needed.  Right now she does not need HD but I will show her the dialysis  videos just for her to get some education 2. Recheck labs in AM     Ofelia Podolski T

## 2014-03-14 NOTE — Progress Notes (Signed)
Pt states she does not feel any better that she still itches and has some general pain with headache. Pt  However appears to feel better.She is not itching while in room and is sitting on side of the bed. Room is very stuffy , but pt does not want the heat turned down. Left door open to allow some air flow. Gave pt cool cloth for head. Pt also reminded that she is on a 1200cc fluid restriction. Pt is eating ice at present. Pt says she understands.Will continue to assess

## 2014-03-14 NOTE — Progress Notes (Signed)
Pt has been complaining of itching all night.  Benadryl was given. It did not seem to have any affect.  On call for [facility] ordered Eucerin cream. Applied to arms,legs and back. Pt stated it has helped some but it still comes back. Will continue to assess. Tramadol given to help with general pain.

## 2014-03-14 NOTE — Progress Notes (Signed)
PROGRESS NOTE    Andrea [name] ZOX:[id number] DOB: May 29, 1951 DOA: 03/08/2014 PCP: Lolita PatellaEADE,ROBERT ALEXANDER, MD  HPI/Brief narrative 63 year old female patient with history of ischemic dilated cardiomyopathy, ICD, CAD status post stenting, CVA, DM 2, intolerant of ACE inhibitors and ARB's, prolonged hospitalization in January for worsening liver failure and [facility] but not felt to be a candidate (note records available), chronic systolic CHF, HTN, dyslipidemia, stage III chronic kidney disease was admitted on 03/08/14 with complaints of nausea and nonbloody emesis. No sickly contacts. She also had some dyspnea.   Assessment/Plan:  1. Nausea and vomiting      -could have started out as gastroenetritis, now worsening likely due to worsening kidney function      -improved, tolerating advanced diet  2. Acute on stage III chronic kidney disease: Baseline creatinine is not clear. She had normal creatinine on 1/18 which had gone up to 2.2 on 1/23. Admitting creatinine on 3/9 was 1.35->2.81->3.8 now ->4.49->4.1     -suspect multifactorial, suspect Primarily Cardio-renal      -strict I/Os     -Renal US: suggesting medical renal disease, no hydronephrosis     -Renal following     -creatinine improving on Milrinone, Stopped IVF 3/13  3. Acute on Chronic systolic CHF/ischemic dilated cardiomyopathy/CAD/ICD:       - No signs of volume overload at this time despite elevated BNP and 1 plus edema . Lasix held. EF 20% August 2014.       -ECho done EF 15% now       -Poor output state, started on IV milrinone per cards 3/13  4. Type II DM with nephropathy:       -with hypoglycemia      -improved, sensitive SSI      -monitor CBGs  5. History of liver failure: and cirrhosis based on USG done at [facility]       -LFTs unremarkable except for low albumin and mildly elevated bilirubin.       -Extensive unremarkable workup done last admission for liver failure  which was unrevealing       -based on [facility] DC summary felt to be due to Cardiomyopathy       -ammonia level is 50, continue lactulose       -LFTS stable  Code Status: Full Family Communication: None at bedside Disposition Plan: Home when medically stable   Consultants:  Cardiology  Procedures:  None  Antibiotics:  Rocephin 3/9 > 3/10   Subjective: Continues to feel weak and " BLAH"  Objective: Filed Vitals:   03/13/14 1454 03/13/14 2000 03/13/14 2230 03/14/14 0354  BP: 125/67 104/70 111/64 135/76  Pulse: 96 98 99 99  Temp: 98.1 F (36.7 C) 98.9 F (37.2 C) 98.2 F (36.8 C) 98.9 F (37.2 C)  TempSrc: Oral Oral Oral Oral  Resp: 18 20 20 20   Height:      Weight:    84.142 kg (185 lb 8 oz)  SpO2: 97% 98% 97% 99%    Intake/Output Summary (Last 24 hours) at 03/14/14 1515 Last data filed at 03/14/14 1505  Gross per 24 hour  Intake  929.9 ml  Output   5750 ml  Net -4820.1 ml   Filed Weights   03/12/14 0500 03/13/14 0500 03/14/14 0354  Weight: 86.9 kg (191 lb 9.3 oz) 86.1 kg (189 lb 13.1 oz) 84.142 kg (185 lb 8 oz)     Exam:  General exam: Moderately built and nourished female,  lethargic, arousible but sleepy, answers most questions. Respiratory system: Clear. No increased work of breathing. Cardiovascular system: S1 & S2 heard, RRR. No JVD, murmurs, gallops, clicks or pedal edema. Telemetry: Sinus rhythm with occasional PVCs. Gastrointestinal system: Abdomen is nondistended, soft and nontender. Normal bowel sounds heard. Central nervous system: Alert and oriented. No focal neurological deficits. Ext: 1 plus edema  Extremities: Symmetric 5 x 5 power.   Data Reviewed: Basic Metabolic Panel:  Recent Labs Lab 03/10/14 0500 03/11/14 0515 03/12/14 0450 03/13/14 0600 03/14/14 0610  NA 136* 136* 135* 139 136*  K 5.1 5.1 5.1 4.4 4.5  CL 94* 94* 96 101 98  CO2 20 20 18* 22 24  GLUCOSE 70 81 105* 154* 119*  BUN 49* 59* 64* 64* 60*  CREATININE 3.83* 4.49*  4.49* 4.17* 3.41*  CALCIUM 9.6 9.3 9.0 9.0 8.9  PHOS  --  6.8*  --   --  2.8   Liver Function Tests:  Recent Labs Lab 03/08/14 0759 03/09/14 0415 03/11/14 0515 03/12/14 0450 03/13/14 0600 03/14/14 0610  AST 25 28 49* 132* 136*  --   ALT 16 16 25  51* 59*  --   ALKPHOS 90 81 102 95 102  --   BILITOT 1.5* 2.1* 2.5* 2.7* 2.3*  --   PROT 8.1 7.5 7.2 6.7 6.5  --   ALBUMIN 3.3* 3.1* 3.1* 2.8* 2.8* 2.9*    Recent Labs Lab 03/08/14 0759  LIPASE 25    Recent Labs Lab 03/08/14 0759 03/11/14 0930  AMMONIA 21 50   CBC:  Recent Labs Lab 03/08/14 0759 03/09/14 0415 03/11/14 0515  WBC 6.1 6.3 6.3  NEUTROABS 3.3  --   --   HGB 12.4 12.0 11.4*  HCT 37.3 35.8* 33.7*  MCV 68.6* 68.2* 66.3*  PLT 273 280 248   Cardiac Enzymes:  Recent Labs Lab 03/08/14 0759 03/09/14 0415  TROPONINI <0.30 <0.30   BNP (last 3 results)  Recent Labs  08/19/13 1024 09/07/13 0955 03/08/14 0759  PROBNP 1769.0* 40.0 2706.0*   CBG:  Recent Labs Lab 03/13/14 1117 03/13/14 1604 03/13/14 2029 03/14/14 0627 03/14/14 1124  GLUCAP 155* 165* 119* 118* 181*    Recent Results (from the past 240 hour(s))  URINE CULTURE     Status: None   Collection Time    03/08/14  6:36 PM      Result Value Ref Range Status   Specimen Description URINE, RANDOM   Final   Special Requests NONE   Final   Culture  Setup Time     Final   Value: 03/09/2014 00:07     Performed at [facility] Count     Final   Value: 90,000 COLONIES/ML     Performed at [facility]   Culture     Final   Value: ENTEROCOCCUS SPECIES     Performed at [facility]   Report Status 03/12/2014 FINAL   Final   Organism ID, Bacteria ENTEROCOCCUS SPECIES   Final      Studies: No results found.      Scheduled Meds: . calcitRIOL  0.25 mcg Oral Daily  . enoxaparin (LOVENOX) injection  30 mg Subcutaneous Q24H  . furosemide  80 mg Intravenous BID  . hydrocerin   Topical BID  . insulin  aspart  0-9 Units Subcutaneous TID WC  . pantoprazole  40 mg Oral Q1200  . sodium chloride  3 mL Intravenous Q12H   Continuous Infusions: . milrinone  0.249 mcg/kg/min (03/14/14 1334)    Active Problems:   Diabetes mellitus type 2, controlled   Coronary artery disease   Dilated cardiomyopathy   Dehydration   Dyspnea   Heart failure   Chronic systolic CHF (congestive heart failure)   Nausea and vomiting   AICD (automatic cardioverter/defibrillator) present   Sinus tachycardia   Renal failure, acute on chronic  Time spent: 35 minutes  Andrea Boffa, MD,  [facility] Pager [phone]  If 7PM-7AM, please contact night-coverage [email or web address] Password [email or web address] 03/14/2014, 3:15 PM    LOS: 6 days

## 2014-03-14 NOTE — Progress Notes (Signed)
Pt watched education videos for dialysis ordered per MD; Video 406, 407, 410.  Park Breed, RN

## 2014-03-14 NOTE — Progress Notes (Signed)
Subjective: Nauseated  No CP  Breathing is fair   Objective: Filed Vitals:   03/13/14 1454 03/13/14 2000 03/13/14 2230 03/14/14 0354  BP: 125/67 104/70 111/64 135/76  Pulse: 96 98 99 99  Temp: 98.1 F (36.7 C) 98.9 F (37.2 C) 98.2 F (36.8 C) 98.9 F (37.2 C)  TempSrc: Oral Oral Oral Oral  Resp: 18 20 20 20   Height:      Weight:    185 lb 8 oz (84.142 kg)  SpO2: 97% 98% 97% 99%   Weight change: -4 lb 5.1 oz (-1.958 kg)  Intake/Output Summary (Last 24 hours) at 03/14/14 1104 Last data filed at 03/14/14 1013  Gross per 24 hour  Intake 1258.27 ml  Output   5950 ml  Net -4691.73 ml   Total I/O -4.5 L  General: Patient is uncomfortable  Nauseated Neck:  Difficult to assess  Heart: Regular rate and rhythm, without murmurs, rubs, gallops.  Lungs: Rel clear Exemities:  1-2+ edema.  SOft Neuro: Grossly intact, nonfocal.  Tel:SR with PVC  Lab Results: Results for orders placed during the hospital encounter of 03/08/14 (from the past 24 hour(s))  GLUCOSE, CAPILLARY     Status: Abnormal   Collection Time    03/13/14 11:17 AM      Result Value Ref Range   Glucose-Capillary 155 (*) 70 - 99 mg/dL  GLUCOSE, CAPILLARY     Status: Abnormal   Collection Time    03/13/14  4:04 PM      Result Value Ref Range   Glucose-Capillary 165 (*) 70 - 99 mg/dL  GLUCOSE, CAPILLARY     Status: Abnormal   Collection Time    03/13/14  8:29 PM      Result Value Ref Range   Glucose-Capillary 119 (*) 70 - 99 mg/dL  RENAL FUNCTION PANEL     Status: Abnormal   Collection Time    03/14/14  6:10 AM      Result Value Ref Range   Sodium 136 (*) 137 - 147 mEq/L   Potassium 4.5  3.7 - 5.3 mEq/L   Chloride 98  96 - 112 mEq/L   CO2 24  19 - 32 mEq/L   Glucose, Bld 119 (*) 70 - 99 mg/dL   BUN 60 (*) 6 - 23 mg/dL   Creatinine, Ser 4.62 (*) 0.50 - 1.10 mg/dL   Calcium 8.9  8.4 - 70.3 mg/dL   Phosphorus 2.8  2.3 - 4.6 mg/dL   Albumin 2.9 (*) 3.5 - 5.2 g/dL   GFR calc non Af Amer 13 (*) >90 mL/min   GFR calc Af Amer 16 (*) >90 mL/min  GLUCOSE, CAPILLARY     Status: Abnormal   Collection Time    03/14/14  6:27 AM      Result Value Ref Range   Glucose-Capillary 118 (*) 70 - 99 mg/dL    Studies/Results: @RISRSLT24 @  Medications: Reviewed    @PROBHOSP @  1.  Acute on chronic systolic CHF LVEF 15%   Diuresing with milrinone  Would continue  Cr improved  2.  CKD  Continue to follow  3.  CAD  Follow  I am not convinced nausea represents angina  LOS: 6 days   Dietrich Pates 03/14/2014, 11:04 AM

## 2014-03-15 ENCOUNTER — Encounter (HOSPITAL_COMMUNITY): Payer: Self-pay | Admitting: Physician Assistant

## 2014-03-15 DIAGNOSIS — J90 Pleural effusion, not elsewhere classified: Secondary | ICD-10-CM

## 2014-03-15 DIAGNOSIS — E785 Hyperlipidemia, unspecified: Secondary | ICD-10-CM

## 2014-03-15 LAB — RENAL FUNCTION PANEL
Albumin: 2.7 g/dL — ABNORMAL LOW (ref 3.5–5.2)
BUN: 51 mg/dL — ABNORMAL HIGH (ref 6–23)
CO2: 29 meq/L (ref 19–32)
Calcium: 9 mg/dL (ref 8.4–10.5)
Chloride: 95 mEq/L — ABNORMAL LOW (ref 96–112)
Creatinine, Ser: 2.65 mg/dL — ABNORMAL HIGH (ref 0.50–1.10)
GFR calc non Af Amer: 18 mL/min — ABNORMAL LOW (ref >90)
GFR, EST AFRICAN AMERICAN: 21 mL/min — AB (ref >90)
GLUCOSE: 158 mg/dL — AB (ref 70–99)
POTASSIUM: 4.1 meq/L (ref 3.7–5.3)
Phosphorus: 3.6 mg/dL (ref 2.3–4.6)
Sodium: 137 mEq/L (ref 137–147)

## 2014-03-15 LAB — GLUCOSE, CAPILLARY
GLUCOSE-CAPILLARY: 143 mg/dL — AB (ref 70–99)
GLUCOSE-CAPILLARY: 177 mg/dL — AB (ref 70–99)
Glucose-Capillary: 125 mg/dL — ABNORMAL HIGH (ref 70–99)
Glucose-Capillary: 171 mg/dL — ABNORMAL HIGH (ref 70–99)

## 2014-03-15 MED ORDER — TRAMADOL HCL 50 MG PO TABS
50.0000 mg | ORAL_TABLET | Freq: Four times a day (QID) | ORAL | Status: DC | PRN
  Administered 2014-03-15 – 2014-03-22 (×14): 50 mg via ORAL
  Filled 2014-03-15 (×13): qty 1

## 2014-03-15 MED ORDER — METOPROLOL TARTRATE 12.5 MG HALF TABLET
12.5000 mg | ORAL_TABLET | Freq: Two times a day (BID) | ORAL | Status: DC
  Administered 2014-03-15: 12.5 mg via ORAL
  Filled 2014-03-15 (×2): qty 1

## 2014-03-15 NOTE — Progress Notes (Signed)
PT Cancellation Note  Patient Details Name: [name] MRN: [id number] DOB: [birth year]   Cancelled Treatment:    Reason Eval/Treat Not Completed: Patient declined, no reason specified ("I'm trying to sleep, come back in the afternoon.") Instructed pt that PT may not be back today; pt gave "thumbs up" and requested to return in PM for further tx sessions.  Moshe Cipro K 03/15/2014, 10:58 AM  Clarita Crane, PT, DPT [phone]

## 2014-03-15 NOTE — Progress Notes (Signed)
Patient Name: Andrea [name] Date of Encounter: 03/15/2014     Active Problems:   Diabetes mellitus type 2, controlled   Coronary artery disease   Dilated cardiomyopathy   Dehydration   Dyspnea   Heart failure   Chronic systolic CHF (congestive heart failure)   Nausea and vomiting   AICD (automatic cardioverter/defibrillator) present   Sinus tachycardia   Renal failure, acute on chronic    SUBJECTIVE  Vomitted x1 this AM when vitals being taken. Feels very tired and weak. No CP or SOB. No orthopnea or PND.   CURRENT MEDS . calcitRIOL  0.25 mcg Oral Daily  . enoxaparin (LOVENOX) injection  30 mg Subcutaneous Q24H  . furosemide  80 mg Intravenous BID  . hydrocerin   Topical BID  . insulin aspart  0-9 Units Subcutaneous TID WC  . pantoprazole  40 mg Oral Q1200  . sodium chloride  3 mL Intravenous Q12H    OBJECTIVE  Filed Vitals:   03/14/14 1500 03/14/14 2044 03/15/14 0410 03/15/14 0444  BP: 127/65 138/71 134/72 124/53  Pulse: 103 115 120 117  Temp: 97.4 F (36.3 C) 98.9 F (37.2 C) 98.5 F (36.9 C) 98.6 F (37 C)  TempSrc: Oral Oral Oral Oral  Resp: 19 19 20 20   Height:      Weight:   175 lb 11.3 oz (79.7 kg) 181 lb 10.5 oz (82.4 kg)  SpO2: 100% 100% 97% 96%    Intake/Output Summary (Last 24 hours) at 03/15/14 0923 Last data filed at 03/15/14 0813  Gross per 24 hour  Intake 752.85 ml  Output   6700 ml  Net -5947.15 ml   Filed Weights   03/14/14 0354 03/15/14 0410 03/15/14 0444  Weight: 185 lb 8 oz (84.142 kg) 175 lb 11.3 oz (79.7 kg) 181 lb 10.5 oz (82.4 kg)    PHYSICAL EXAM  General: Pleasant, NAD. Somnolent Neuro: Alert and oriented X 3. Moves all extremities spontaneously. Psych: Normal affect. HEENT:  Normal  Neck: Supple without bruits or JVD. Lungs:  Resp regular and unlabored, CTA. Heart: RRR no s3, s4, or murmurs. Tachycardic  Abdomen: Soft, tender, non-distended, BS + x 4. + belly pain with palpitation  Extremities: No clubbing,  cyanosis or edema. DP/PT/Radials 2+ and equal bilaterally.  Accessory Clinical Findings  CBC  Basic Metabolic Panel  Recent Labs  03/14/14 0610 03/15/14 0500  NA 136* 137  K 4.5 4.1  CL 98 95*  CO2 24 29  GLUCOSE 119* 158*  BUN 60* 51*  CREATININE 3.41* 2.65*  CALCIUM 8.9 9.0  PHOS 2.8 3.6   Liver Function Tests  Recent Labs  03/13/14 0600 03/14/14 0610 03/15/14 0500  AST 136*  --   --   ALT 59*  --   --   ALKPHOS 102  --   --   BILITOT 2.3*  --   --   PROT 6.5  --   --   ALBUMIN 2.8* 2.9* 2.7*    TELE  Sinus tach, HE 120s freq PVCs   Radiology/Studies  Dg Chest 2 View  03/08/2014   CLINICAL DATA:  Shortness of breath.  EXAM: CHEST  2 VIEW  COMPARISON:  CT ABD/PELV WO CM dated 01/18/2014; DG CHEST 2 VIEW dated 01/17/2014  FINDINGS: AICD noted with lead tip projected over right ventricle.Cardiomegaly with normal pulmonary vascularity. No focal infiltrate. No pleural effusion or pneumothorax. No acute osseous abnormality. Prior cervical spine fusion.  IMPRESSION: 1. Stable severe cardiomegaly. AICD noted in  stable position. No CHF. 2. Interim clearing of small right pleural effusion.   Electronically Signed   By: Maisie Fushomas  Register   On: 03/08/2014 08:23   [facility]  03/10/2014   CLINICAL DATA Acute kidney injury  EXAM RENAL/URINARY TRACT ULTRASOUND COMPLETE  COMPARISON 01/20/2014  FINDINGS Right Kidney:  Length: 12.4 cm. Lobular contour. Echogenic renal parenchyma, suggesting medical renal disease. No mass or hydronephrosis.  Left Kidney:  Length: 11.5 cm. Lobular contour. Echogenic renal parenchyma, suggesting medical renal disease. No mass or hydronephrosis.  Bladder:  Underdistended but grossly unremarkable.  IMPRESSION Echogenic renal parenchyma, suggesting medical renal disease.  No hydronephrosis.  SIGNATURE  Electronically Signed   By: Charline BillsSriyesh  Krishnan M.D.   On: 03/10/2014 17:11    ASSESSMENT AND PLAN Andrea [name] is a 63 y.o. 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   1. CAD: s/p PCI with DES to prox RCA (2012) and stent to dist RCA (2012). She is a nonresponder to Plavix. She does not tolerate Brillinta because of wheezing and she cannot receive Effient because of a CVA history. ASA allergy -- No angina, No evidence for acute ischemia.  -- BB d/c'd by nephro, No statin 2/2 liver failure?  2. Chronic systlic CHF: Her Echo shows an EF of 15%. Her symptoms thought to be due to lack of forward flow. Creatinine continued to climb. Reviewed with Dr. Gala RomneyBensimhon who recommended trial of Milrinone 0.25 mcg/kg/min and IV lasix, which she has tolerated well. Would continue as Cr improved  -- Weight recorded as being up 7 lbs; however -9L. Likely inaccurate weights.  -- She does not tolerate ACE inhibitors or ARB's because of hypotension and acute renal failure.  -- Will add back BB with good pressure and creat normalized. Also pt is tachycardic. Will add home metoprolol 12.5 BID back. Then can be titrated off milrinone -  MD to see.  3. CKD: Acute on CKD3 suspected to be hemodynamically mediated by low BP in face of poor CO.  -- Creat greatly improved on milrinone. 4.49--> 4.17-->3.41-->2.65 (today). Nephrology following.   4. History of liver failure: and cirrhosis [facility] -- LFTs unremarkable except for low albumin and mildly elevated bilirubin.  -- Extensive unremarkable workup done last admission for liver failure which was unrevealing  -- Bbased on [facility] DC summary felt to be due to Cardiomyopathy  -- Ammonia level is 50, continue lactulose  -  LFTS stable     Signed, Andrea ParkinSTERN, Andrea Zamor PA-C  Pager [phone]

## 2014-03-15 NOTE — Progress Notes (Signed)
I have seen and evaluated the patient this PM along with Boyce Medici, PA. I agree with her findings, examination as well as impression recommendations.  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  On milrinone & IV lasix with brisk diuresis.  Still with some nausea, but improved.  Renal function improved.  Agree with restarting low dose BB given tachycardia with BB held.  No other BP meds. No statin with Hepatic failure.   With her level of Cardiomyopathy, I feel that Advanced CHF consultation is clearly warranted to assist with & perhaps take over in-patient management.  I think that she would benefit from OP follow-up in CHF clinic as well.  No arrythmia besides S Tachy.   Marykay Lex, M.D., M.S. Interventional Cardiologist  [facility] Pager # [phone] 03/15/2014

## 2014-03-15 NOTE — Progress Notes (Signed)
OT Cancellation Note  Patient Details Name: Andrea [name] MRN: [id number] DOB: December 07, 1951   Cancelled Treatment:    Reason Eval/Treat Not Completed: Medical issues which prohibited therapy - Pt nauseated and refused OT at this time.  Will reattempt.  Jeani Hawking M [phone] 03/15/2014, 1:53 PM

## 2014-03-15 NOTE — Progress Notes (Signed)
Pt has a headache; MD ordered tylenol 650 mg once; pt refuses to take; pt states tylenol doesn't do anything for her.  Park Breed, RN

## 2014-03-15 NOTE — Progress Notes (Signed)
Patient ID: Andrea [name], female   DOB: 08-02-1951, 63 y.o.   MRN: [id number]  [facility] Progress Note    Assessment/ Plan:   1. Acute on CKD 3. Most likely cardiorenal syndrome and poor renal perfusion in the face of low cardiac output. Scr trending down and UO increasing on Milrinone. No acute HD needs at this time (and appears she may be a marginal candidate).   2. Cardiomyopathy EF 15%: on lasix/milrinone 3. Hepatic cirrhosis apparently not a candidate for OLTX based on w/u at [facility] 4. DM  5. Sec HPTH: phosphorus levels normal (not on binders). On calcitriol (PTH 299 on 3/12)    Subjective:   Reports to be feeling better and able to sleep comfortably   Objective:   BP 124/53  Pulse 117  Temp(Src) 98.6 F (37 C) (Oral)  Resp 20  Ht 5' (1.524 m)  Wt 82.4 kg (181 lb 10.5 oz)  BMI 35.48 kg/m2  SpO2 96%  Intake/Output Summary (Last 24 hours) at 03/15/14 0923 Last data filed at 03/15/14 0813  Gross per 24 hour  Intake 752.85 ml  Output   6700 ml  Net -5947.15 ml   Weight change: -4.442 kg (-9 lb 12.7 oz)  Physical Exam: IRJ:[id number] resting in bed ZSW:FUXNA RRR, 2/6 HSM over outflow Resp:Poor inspiratory effort- no rales TFT:DDUK, obese, NT, BS normal Ext:2+ LE edema  Imaging: No results found.  Labs: BMET  Recent Labs Lab 03/09/14 1225 03/10/14 0500 03/11/14 0515 03/12/14 0450 03/13/14 0600 03/14/14 0610 03/15/14 0500  NA 136* 136* 136* 135* 139 136* 137  K 5.1 5.1 5.1 5.1 4.4 4.5 4.1  CL 95* 94* 94* 96 101 98 95*  CO2 23 20 20  18* 22 24 29   GLUCOSE 99 70 81 105* 154* 119* 158*  BUN 38* 49* 59* 64* 64* 60* 51*  CREATININE 2.81* 3.83* 4.49* 4.49* 4.17* 3.41* 2.65*  CALCIUM 9.6 9.6 9.3 9.0 9.0 8.9 9.0  PHOS  --   --  6.8*  --   --  2.8 3.6   CBC  Recent Labs Lab 03/09/14 0415 03/11/14 0515  WBC 6.3 6.3  HGB 12.0 11.4*  HCT 35.8* 33.7*  MCV 68.2* 66.3*  PLT 280 248   Medications:    . calcitRIOL  0.25 mcg Oral  Daily  . enoxaparin (LOVENOX) injection  30 mg Subcutaneous Q24H  . furosemide  80 mg Intravenous BID  . hydrocerin   Topical BID  . insulin aspart  0-9 Units Subcutaneous TID WC  . pantoprazole  40 mg Oral Q1200  . sodium chloride  3 mL Intravenous Q12H   Zetta Bills, MD 03/15/2014, 9:23 AM

## 2014-03-15 NOTE — Progress Notes (Addendum)
PROGRESS NOTE    Andrea [name] AVW:[id number] DOB: 1951-03-24 DOA: 03/08/2014 PCP: Lolita Patella, MD  HPI/Brief narrative 63 year old female patient with history of ischemic dilated cardiomyopathy, ICD, CAD status post stenting, CVA, DM 2, intolerant of ACE inhibitors and ARB's, prolonged hospitalization in January for worsening liver failure and transferred to [facility] for consideration of liver transplant but not felt to be a candidate (note records available), chronic systolic CHF, HTN, dyslipidemia, stage III chronic kidney disease was admitted on 03/08/14 with complaints of nausea and nonbloody emesis. No sickly contacts. She also had some dyspnea.   Assessment/Plan:  1. Nausea and vomiting      -could have started out as gastroenetritis, then worsening likely due to poor output state      -Po intake fluctuates, supportive care  2. Acute on stage III chronic kidney disease: Baseline creatinine is not clear. She had normal creatinine on 1/18 which had gone up to 2.2 on 1/23. Admitting creatinine on 3/9 was 1.35->2.81->3.8 now ->4.49->4.1->3.4->2.6     -suspect multifactorial, suspect Primarily Cardio-renal      -strict I/Os     -Renal US: suggesting medical renal disease, no hydronephrosis     -Renal following,  IVF stopped 3/13     -creatinine improving on Milrinone     -Renal and Cards following  3. Acute on Chronic systolic CHF/ischemic dilated cardiomyopathy/CAD/ICD:       - No signs of volume overload at this time despite elevated BNP and 1 plus edema . Lasix initially. EF 20% August 2014.       -ECho done EF 15% now       -Poor output state, started on IV milrinone/lasix per cards 3/13       -creatinine improving  4. Type II DM with nephropathy:       -with hypoglycemia      -improved, sensitive SSI      -monitor CBGs  5. History of liver failure: and cirrhosis based on USG done at [facility]       -LFTs unremarkable except for low albumin and mildly elevated  bilirubin.       -Extensive unremarkable workup done last admission for liver failure which was unrevealing       -based on [facility] DC summary felt to be due to Cardiomyopathy       -ammonia level is 50, continue lactulose       -LFTS stable  Code Status: Full Family Communication: None at bedside Disposition Plan: Home when medically stable   Consultants:  Cardiology  Procedures:  None  Antibiotics:  Rocephin 3/9 > 3/10   Subjective: Feels so-so, vomiting x1 yesterday   Objective: Filed Vitals:   03/14/14 1500 03/14/14 2044 03/15/14 0410 03/15/14 0444  BP: 127/65 138/71 134/72 124/53  Pulse: 103 115 120 117  Temp: 97.4 F (36.3 C) 98.9 F (37.2 C) 98.5 F (36.9 C) 98.6 F (37 C)  TempSrc: Oral Oral Oral Oral  Resp: 19 19 20 20   Height:      Weight:   79.7 kg (175 lb 11.3 oz) 82.4 kg (181 lb 10.5 oz)  SpO2: 100% 100% 97% 96%    Intake/Output Summary (Last 24 hours) at 03/15/14 0946 Last data filed at 03/15/14 0813  Gross per 24 hour  Intake 752.85 ml  Output   6700 ml  Net -5947.15 ml   Filed Weights   03/14/14 0354 03/15/14 0410 03/15/14 0444  Weight: 84.142 kg (185 lb 8 oz) 79.7 kg (175 lb  11.3 oz) 82.4 kg (181 lb 10.5 oz)     Exam:  General exam: Moderately built and nourished female, lethargic, arousible but sleepy, answers most questions. Respiratory system: Clear. No increased work of breathing. Cardiovascular system: S1 & S2 heard, RRR. No JVD, murmurs, gallops, clicks or pedal edema. Telemetry: Sinus rhythm with occasional PVCs. Gastrointestinal system: Abdomen is nondistended, soft and nontender. Normal bowel sounds heard. Central nervous system: Alert and oriented. No focal neurological deficits. Ext: 1 plus edema  Extremities: Symmetric 5 x 5 power.   Data Reviewed: Basic Metabolic Panel:  Recent Labs Lab 03/11/14 0515 03/12/14 0450 03/13/14 0600 03/14/14 0610 03/15/14 0500  NA 136* 135* 139 136* 137  K 5.1 5.1 4.4 4.5 4.1  CL 94*  96 101 98 95*  CO2 20 18* 22 24 29   GLUCOSE 81 105* 154* 119* 158*  BUN 59* 64* 64* 60* 51*  CREATININE 4.49* 4.49* 4.17* 3.41* 2.65*  CALCIUM 9.3 9.0 9.0 8.9 9.0  PHOS 6.8*  --   --  2.8 3.6   Liver Function Tests:  Recent Labs Lab 03/09/14 0415 03/11/14 0515 03/12/14 0450 03/13/14 0600 03/14/14 0610 03/15/14 0500  AST 28 49* 132* 136*  --   --   ALT 16 25 51* 59*  --   --   ALKPHOS 81 102 95 102  --   --   BILITOT 2.1* 2.5* 2.7* 2.3*  --   --   PROT 7.5 7.2 6.7 6.5  --   --   ALBUMIN 3.1* 3.1* 2.8* 2.8* 2.9* 2.7*   No results found for this basename: LIPASE, AMYLASE,  in the last 168 hours  Recent Labs Lab 03/11/14 0930  AMMONIA 50   CBC:  Recent Labs Lab 03/09/14 0415 03/11/14 0515  WBC 6.3 6.3  HGB 12.0 11.4*  HCT 35.8* 33.7*  MCV 68.2* 66.3*  PLT 280 248   Cardiac Enzymes:  Recent Labs Lab 03/09/14 0415  TROPONINI <0.30   BNP (last 3 results)  Recent Labs  08/19/13 1024 09/07/13 0955 03/08/14 0759  PROBNP 1769.0* 40.0 2706.0*   CBG:  Recent Labs Lab 03/14/14 0627 03/14/14 1124 03/14/14 1605 03/14/14 2054 03/15/14 0629  GLUCAP 118* 181* 107* 175* 143*    Recent Results (from the past 240 hour(s))  URINE CULTURE     Status: None   Collection Time    03/08/14  6:36 PM      Result Value Ref Range Status   Specimen Description URINE, RANDOM   Final   Special Requests NONE   Final   Culture  Setup Time     Final   Value: 03/09/2014 00:07     Performed at [facility] Count     Final   Value: 90,000 COLONIES/ML     Performed at [facility]   Culture     Final   Value: ENTEROCOCCUS SPECIES     Performed at [facility]   Report Status 03/12/2014 FINAL   Final   Organism ID, Bacteria ENTEROCOCCUS SPECIES   Final      Studies: No results found.      Scheduled Meds: . calcitRIOL  0.25 mcg Oral Daily  . enoxaparin (LOVENOX) injection  30 mg Subcutaneous Q24H  . furosemide  80 mg  Intravenous BID  . hydrocerin   Topical BID  . insulin aspart  0-9 Units Subcutaneous TID WC  . pantoprazole  40 mg Oral Q1200  . sodium chloride  3 mL Intravenous Q12H   Continuous Infusions: . milrinone 0.249 mcg/kg/min (03/15/14 0813)    Active Problems:   Diabetes mellitus type 2, controlled   Coronary artery disease   Dilated cardiomyopathy   Dehydration   Dyspnea   Heart failure   Chronic systolic CHF (congestive heart failure)   Nausea and vomiting   AICD (automatic cardioverter/defibrillator) present   Sinus tachycardia   Renal failure, acute on chronic  Time spent: 35 minutes  Sameul Tagle, MD,  [facility] Pager [phone]  If 7PM-7AM, please contact night-coverage [email or web address] Password [email or web address] 03/15/2014, 9:46 AM    LOS: 7 days

## 2014-03-15 NOTE — Progress Notes (Signed)
Patient seen and chart reviewed.   Renal function improving on milrinone. Exact creatinine baseline unclear. Would continue inotropic support for now and see how good she gets.  Would not start b-blocker yet - tachycardia may be related to low output.   Her liver disease seems to be a serious issue. Unsure if this is cardiogenic or not. [facility] Records in Care Everywhere reviewed but I was unable to find a note from the Liver service to get their impressions.   Her insight into her HF seems to be somewhat limited and she has asked me to discuss with her partner [name].   Will continue milrinone until renal function plateaus and then will try to wean. May need RHC at that point and consideration of home milrinone if we cannot maintain her on oral meds.   HF team will assume care for now.   Truman Hayward 5:58 PM

## 2014-03-16 LAB — GLUCOSE, CAPILLARY
GLUCOSE-CAPILLARY: 130 mg/dL — AB (ref 70–99)
GLUCOSE-CAPILLARY: 90 mg/dL (ref 70–99)
Glucose-Capillary: 138 mg/dL — ABNORMAL HIGH (ref 70–99)
Glucose-Capillary: 110 mg/dL — ABNORMAL HIGH (ref 70–99)

## 2014-03-16 LAB — BASIC METABOLIC PANEL
BUN: 47 mg/dL — AB (ref 6–23)
CALCIUM: 8.5 mg/dL (ref 8.4–10.5)
CO2: 30 mEq/L (ref 19–32)
Chloride: 93 mEq/L — ABNORMAL LOW (ref 96–112)
Creatinine, Ser: 2.38 mg/dL — ABNORMAL HIGH (ref 0.50–1.10)
GFR calc Af Amer: 24 mL/min — ABNORMAL LOW (ref >90)
GFR, EST NON AFRICAN AMERICAN: 21 mL/min — AB (ref >90)
GLUCOSE: 121 mg/dL — AB (ref 70–99)
Potassium: 3.8 mEq/L (ref 3.7–5.3)
Sodium: 136 mEq/L — ABNORMAL LOW (ref 137–147)

## 2014-03-16 MED ORDER — FUROSEMIDE 10 MG/ML IJ SOLN
60.0000 mg | Freq: Two times a day (BID) | INTRAMUSCULAR | Status: DC
Start: 2014-03-16 — End: 2014-03-16
  Administered 2014-03-16: 60 mg via INTRAVENOUS

## 2014-03-16 MED ORDER — GABAPENTIN 100 MG PO CAPS
100.0000 mg | ORAL_CAPSULE | Freq: Two times a day (BID) | ORAL | Status: DC
  Administered 2014-03-16 – 2014-03-24 (×14): 100 mg via ORAL
  Filled 2014-03-16 (×16): qty 1

## 2014-03-16 MED ORDER — FUROSEMIDE 80 MG PO TABS
80.0000 mg | ORAL_TABLET | Freq: Every day | ORAL | Status: DC
  Administered 2014-03-17 – 2014-03-24 (×7): 80 mg via ORAL
  Filled 2014-03-16 (×8): qty 1

## 2014-03-16 NOTE — Progress Notes (Addendum)
PROGRESS NOTE    Andrea [name] AVW:[id number] DOB: 05/24/1951 DOA: 03/08/2014 PCP: Lolita Patella, MD  HPI/Brief narrative 63 year old female patient with history of ischemic dilated cardiomyopathy, ICD, CAD status post stenting, CVA, DM 2, intolerant of ACE inhibitors and ARB's, prolonged hospitalization in January for worsening liver failure and transferred to [facility] for consideration of liver transplant but not felt to be a candidate (note records available), chronic systolic CHF, HTN, dyslipidemia, stage III chronic kidney disease was admitted on 03/08/14 with complaints of nausea and nonbloody emesis. SUbsequently developed progressive renal failure, creatinine of 4.5, felt to be due to poor Flow state, cardio-renal, from low EF of 15%. Is being followed by [facility], started on Milrinone gtt and creatinine improving now. Heart failure team managing  Assessment/Plan:  1. Nausea and vomiting      -could have started out as gastroenetritis, then worsening likely due to poor output state      -Po intake fluctuates, supportive care  2. Acute on stage III chronic kidney disease: Baseline creatinine is not clear. She had normal creatinine on 1/18 which had gone up to 2.2 on 1/23. Admitting creatinine on 3/9 was 1.35->2.81->3.8 now ->4.49->4.1->3.4->2.6->2.3     -suspect multifactorial, suspect Primarily Cardio-renal      -strict I/Os     -Renal US: suggesting medical renal disease, no hydronephrosis     -Renal following,  IVF stopped 3/13     -creatinine improving on Milrinone/lasix     -Renal and Cards following, per heart failure team  3. Acute on Chronic systolic CHF/ischemic dilated cardiomyopathy/CAD/ICD:       - No signs of volume overload at this time despite elevated BNP and 1 plus edema . Lasix initially. EF 20% August 2014.       -ECho done EF 15% now       -Poor output state, started on IV milrinone/lasix per cards 3/13       -creatinine improving  4. Type  II DM with nephropathy:       -with hypoglycemia initially      -improved, sensitive SSI      -monitor CBGs  5. History of liver failure: and cirrhosis based on USG done at [facility]       -LFTs unremarkable except for low albumin and mildly elevated bilirubin.       -Extensive unremarkable workup done last admission for liver failure which was unrevealing       -based on [facility] DC summary felt to be due to Cardiomyopathy       -ammonia level is 50, continue lactulose       -LFTS stable  6. Foot pain/? Neuropathic     -try Gabapentin  Code Status: Full Family Communication: None at bedside Disposition Plan: Home when medically stable   Consultants:  Cardiology  Renal  CHF  Procedures:  None  Antibiotics:  Rocephin 3/9 > 3/10   Subjective: No further vomiting, feels ok, pain in feet  Objective: Filed Vitals:   03/15/14 0444 03/15/14 1450 03/15/14 2028 03/16/14 0437  BP: 124/53 108/54 112/62 109/61  Pulse: 117 98 105 105  Temp: 98.6 F (37 C) 98 F (36.7 C) 97.5 F (36.4 C) 98.4 F (36.9 C)  TempSrc: Oral Oral Oral Oral  Resp: 20 19 18 20   Height:      Weight: 82.4 kg (181 lb 10.5 oz)   79.4 kg (175 lb 0.7 oz)  SpO2: 96% 95% 99% 100%    Intake/Output Summary (Last  24 hours) at 03/16/14 1355 Last data filed at 03/16/14 1310  Gross per 24 hour  Intake 510.33 ml  Output   2650 ml  Net -2139.67 ml   Filed Weights   03/15/14 0410 03/15/14 0444 03/16/14 0437  Weight: 79.7 kg (175 lb 11.3 oz) 82.4 kg (181 lb 10.5 oz) 79.4 kg (175 lb 0.7 oz)     Exam:  General exam: Moderately built and nourished female, lethargic, arousible but sleepy, answers most questions. Respiratory system: Clear. No increased work of breathing. Cardiovascular system: S1 & S2 heard, RRR. No JVD, murmurs, gallops, clicks or pedal edema. Telemetry: Sinus rhythm with occasional PVCs. Gastrointestinal system: Abdomen is nondistended, soft and nontender. Normal bowel sounds heard. Central  nervous system: Alert and oriented. No focal neurological deficits. Ext: 1 plus edema  Extremities: Symmetric 5 x 5 power.   Data Reviewed: Basic Metabolic Panel:  Recent Labs Lab 03/11/14 0515 03/12/14 0450 03/13/14 0600 03/14/14 0610 03/15/14 0500 03/16/14 0510  NA 136* 135* 139 136* 137 136*  K 5.1 5.1 4.4 4.5 4.1 3.8  CL 94* 96 101 98 95* 93*  CO2 20 18* 22 24 29 30   GLUCOSE 81 105* 154* 119* 158* 121*  BUN 59* 64* 64* 60* 51* 47*  CREATININE 4.49* 4.49* 4.17* 3.41* 2.65* 2.38*  CALCIUM 9.3 9.0 9.0 8.9 9.0 8.5  PHOS 6.8*  --   --  2.8 3.6  --    Liver Function Tests:  Recent Labs Lab 03/11/14 0515 03/12/14 0450 03/13/14 0600 03/14/14 0610 03/15/14 0500  AST 49* 132* 136*  --   --   ALT 25 51* 59*  --   --   ALKPHOS 102 95 102  --   --   BILITOT 2.5* 2.7* 2.3*  --   --   PROT 7.2 6.7 6.5  --   --   ALBUMIN 3.1* 2.8* 2.8* 2.9* 2.7*   No results found for this basename: LIPASE, AMYLASE,  in the last 168 hours  Recent Labs Lab 03/11/14 0930  AMMONIA 50   CBC:  Recent Labs Lab 03/11/14 0515  WBC 6.3  HGB 11.4*  HCT 33.7*  MCV 66.3*  PLT 248   Cardiac Enzymes: No results found for this basename: CKTOTAL, CKMB, CKMBINDEX, TROPONINI,  in the last 168 hours BNP (last 3 results)  Recent Labs  08/19/13 1024 09/07/13 0955 03/08/14 0759  PROBNP 1769.0* 40.0 2706.0*   CBG:  Recent Labs Lab 03/15/14 1102 03/15/14 1629 03/15/14 2116 03/16/14 0617 03/16/14 1114  GLUCAP 177* 125* 171* 138* 130*    Recent Results (from the past 240 hour(s))  URINE CULTURE     Status: None   Collection Time    03/08/14  6:36 PM      Result Value Ref Range Status   Specimen Description URINE, RANDOM   Final   Special Requests NONE   Final   Culture  Setup Time     Final   Value: 03/09/2014 00:07     Performed at [facility] Count     Final   Value: 90,000 COLONIES/ML     Performed at [facility]   Culture     Final   Value:  ENTEROCOCCUS SPECIES     Performed at [facility]   Report Status 03/12/2014 FINAL   Final   Organism ID, Bacteria ENTEROCOCCUS SPECIES   Final      Studies: No results found.  Scheduled Meds: . calcitRIOL  0.25 mcg Oral Daily  . enoxaparin (LOVENOX) injection  30 mg Subcutaneous Q24H  . [START ON 03/17/2014] furosemide  80 mg Oral Daily  . hydrocerin   Topical BID  . insulin aspart  0-9 Units Subcutaneous TID WC  . pantoprazole  40 mg Oral Q1200  . sodium chloride  3 mL Intravenous Q12H   Continuous Infusions: . milrinone 0.25 mcg/kg/min (03/16/14 [id number])    Active Problems:   Diabetes mellitus type 2, controlled   Coronary artery disease   Dilated cardiomyopathy   Dehydration   Dyspnea   Heart failure   Chronic systolic CHF (congestive heart failure)   Nausea and vomiting   AICD (automatic cardioverter/defibrillator) present   Sinus tachycardia   Renal failure, acute on chronic  Time spent: 35 minutes  Phillip Maffei, MD,  [facility] Pager [phone]  If 7PM-7AM, please contact night-coverage [email or web address] Password [email or web address] 03/16/2014, 1:55 PM    LOS: 8 days

## 2014-03-16 NOTE — Progress Notes (Signed)
PT Cancellation Note  Patient Details Name: Andrea [name] MRN: [id number] DOB: [birth year]   Cancelled Treatment:     pt refused treatment this am due to bilateral foot pain.  Asked Dr. Jomarie Longs to see if she could address her pain.  PT not able to get back until late and pt just up with cardiac rehab.    03/16/2014  [place], PT [phone]  (pager)   Andrea [name], [name] 03/16/2014, 4:24 PM

## 2014-03-16 NOTE — Progress Notes (Signed)
Patient Name: Andrea [name] Date of Encounter: 03/16/2014   SUBJECTIVE  Complains of feet hurting.  No CP or SOB. No orthopnea or PND. Remains on milrinone. Weight trending down. Renal function improving.  CURRENT MEDS . calcitRIOL  0.25 mcg Oral Daily  . enoxaparin (LOVENOX) injection  30 mg Subcutaneous Q24H  . furosemide  60 mg Intravenous BID  . hydrocerin   Topical BID  . insulin aspart  0-9 Units Subcutaneous TID WC  . pantoprazole  40 mg Oral Q1200  . sodium chloride  3 mL Intravenous Q12H    OBJECTIVE  Filed Vitals:   03/15/14 0444 03/15/14 1450 03/15/14 2028 03/16/14 0437  BP: 124/53 108/54 112/62 109/61  Pulse: 117 98 105 105  Temp: 98.6 F (37 C) 98 F (36.7 C) 97.5 F (36.4 C) 98.4 F (36.9 C)  TempSrc: Oral Oral Oral Oral  Resp: 20 19 18 20   Height:      Weight: 82.4 kg (181 lb 10.5 oz)   79.4 kg (175 lb 0.7 oz)  SpO2: 96% 95% 99% 100%    Intake/Output Summary (Last 24 hours) at 03/16/14 1013 Last data filed at 03/16/14 0826  Gross per 24 hour  Intake 404.74 ml  Output   1800 ml  Net -1395.26 ml   Filed Weights   03/15/14 0410 03/15/14 0444 03/16/14 0437  Weight: 79.7 kg (175 lb 11.3 oz) 82.4 kg (181 lb 10.5 oz) 79.4 kg (175 lb 0.7 oz)    PHYSICAL EXAM  General: Pleasant, NAD. Lying flat in bed.  Neuro: Alert and oriented X 3. Moves all extremities spontaneously. Psych: Normal affect. HEENT:  Normal  Neck: Supple without bruits JVP 7 Lungs:  Resp regular and unlabored, CTA. Heart: RRR no s3, s4, or murmurs. Tachycardic  Abdomen: Soft, tender, non-distended, BS + x 4.  Extremities: No clubbing, cyanosis or edema. DP/PT/Radials 2+ and equal bilaterally.  Accessory Clinical Findings  CBC  Basic Metabolic Panel  Recent Labs  03/14/14 0610 03/15/14 0500 03/16/14 0510  NA 136* 137 136*  K 4.5 4.1 3.8  CL 98 95* 93*  CO2 24 29 30   GLUCOSE 119* 158* 121*  BUN 60* 51* 47*  CREATININE 3.41* 2.65* 2.38*  CALCIUM 8.9 9.0 8.5    PHOS 2.8 3.6  --    Liver Function Tests  Recent Labs  03/14/14 0610 03/15/14 0500  ALBUMIN 2.9* 2.7*    TELE  Sinus tach, 110s freq PVCs   Radiology/Studies  Dg Chest 2 View  03/08/2014   CLINICAL DATA:  Shortness of breath.  EXAM: CHEST  2 VIEW  COMPARISON:  CT ABD/PELV WO CM dated 01/18/2014; DG CHEST 2 VIEW dated 01/17/2014  FINDINGS: AICD noted with lead tip projected over right ventricle.Cardiomegaly with normal pulmonary vascularity. No focal infiltrate. No pleural effusion or pneumothorax. No acute osseous abnormality. Prior cervical spine fusion.  IMPRESSION: 1. Stable severe cardiomegaly. AICD noted in stable position. No CHF. 2. Interim clearing of small right pleural effusion.   Electronically Signed   By: Andrea Fus  [name]   On: 03/08/2014 08:23   US Renal  03/10/2014   CLINICAL DATA Acute kidney injury  EXAM RENAL/URINARY TRACT ULTRASOUND COMPLETE  COMPARISON 01/20/2014  FINDINGS Right Kidney:  Length: 12.4 cm. Lobular contour. Echogenic renal parenchyma, suggesting medical renal disease. No mass or hydronephrosis.  Left Kidney:  Length: 11.5 cm. Lobular contour. Echogenic renal parenchyma, suggesting medical renal disease. No mass or hydronephrosis.  Bladder:  Underdistended but grossly unremarkable.  IMPRESSION  Echogenic renal parenchyma, suggesting medical renal disease.  No hydronephrosis.  SIGNATURE  Electronically Signed   By: Andrea BillsSriyesh  [name] M.D.   On: 03/10/2014 17:11    ASSESSMENT AND PLAN Andrea HeadingsCorinne G [name] is a 63 y.o. 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   1. CAD: s/p PCI with DES to prox RCA (2012) and stent to dist RCA (2012). She is a nonresponder to Plavix. She does not tolerate Brillinta because of wheezing and she cannot receive Effient because of a CVA history. ASA allergy -- No angina, No evidence  for acute ischemia.   2. Acute on Chronic systlic CHF: Her Echo shows an EF of 15%.  3. Acute on chronic renal failure stage NW:[id number]:suspected to be hemodynamically mediated by low BP in face of poor CO.  -- Creat greatly improved on milrinone. 4.49--> 4.17-->3.41-->2.65-> 2.38  (today). Nephrology following.   4. History of liver failure: and cirrhosis [facility] -- LFTs unremarkable except for low albumin and mildly elevated bilirubin.  -- Extensive unremarkable workup done last admission for liver failure which was unrevealing  -- Bbased on [facility] DC summary felt to be due to Cardiomyopathy  -- Ammonia level is 50, continue lactulose  -  LFTS stable   Difficult situation. Renal function improving on milrinone but remains somewhat hemodynamically tenuous. Will continue milrinone until creatinine plateaus. Will change lasix to po. Not candidate for bblocker or ACE at this time. I am concerned about her level of insight and ability to deal with her HF. Based on this and her renal and liver function, I am not sure she is candidate for advanced HF therapies. We will see how good her renal function will get. Will likely need RHC once milrinone is weaned to assess need for home milrinone.     Andrea Remmert,MD 10:20 AM

## 2014-03-16 NOTE — Progress Notes (Signed)
Occupational Therapy Treatment Patient Details Name: Andrea HeadingsCorinne G Bjelland MRN: [id number] DOB: 1951/12/30 Today's Date: 03/16/2014 Time: [id number] OT Time Calculation (min): 45 min  OT Assessment / Plan / Recommendation  History of present illness 63 year old female patient with history of ischemic dilated cardiomyopathy, ICD, CAD status post stenting, CVA, DM 2, intolerant of ACE inhibitors and ARB's, prolonged hospitalization in January for worsening liver failure and [facility] but not felt to be a candidate (note records available), chronic systolic CHF, HTN, dyslipidemia, stage III chronic kidney disease was admitted on 03/08/14 with complaints of nausea and nonbloody emesis. No sickly contacts. She also had some dyspnea.   OT comments  Pt. Agreeable to participation in skilled o.t., limited by c/o B foot pain.  Pt. Required rest breaks and would weight shift secondary to this reported pain.  Requires min guard a/s during amb. And standing tasks.  Has a partner who is very involved in her care.  Also has a son in high school.    Follow Up Recommendations  Home health OT;Supervision - Intermittent           Equipment Recommendations  None recommended by OT        Frequency Min 2X/week   Progress towards OT Goals Progress towards OT goals: Progressing toward goals  Plan Discharge plan remains appropriate    Precautions / Restrictions     Pertinent Vitals/Pain Did not rate but c/o pain in her feet with all functional tasks    ADL  Grooming: Performed;Min guard Where Assessed - Grooming: Unsupported standing Lower Body Dressing: Performed;Supervision/safety Where Assessed - Lower Body Dressing: Unsupported sitting Toilet Transfer: Simulated;Min guard Toilet Transfer Method: Sit to stand;Stand pivot Toileting - Clothing Manipulation and Hygiene: Simulated;Min guard Transfers/Ambulation Related to ADLs: min guard amb. in room.  pt.  would initiate breaks without warning secondary to c/o b foot pain.  encouraged to alert person assisting her secondary  ADL Comments: able to don/doff socks and shoes crossing ankles over knees with s eob.      OT Goals(current goals can now be found in the care plan section)    Visit Information  Last OT Received On: 03/16/14 History of Present Illness: 63 year old female patient with history of ischemic dilated cardiomyopathy, ICD, CAD status post stenting, CVA, DM 2, intolerant of ACE inhibitors and ARB's, prolonged hospitalization in January for worsening liver failure and [facility] but not felt to be a candidate (note records available), chronic systolic CHF, HTN, dyslipidemia, stage III chronic kidney disease was admitted on 03/08/14 with complaints of nausea and nonbloody emesis. No sickly contacts. She also had some dyspnea.                 Cognition  Cognition Arousal/Alertness: Awake/alert Behavior During Therapy: WFL for tasks assessed/performed Overall Cognitive Status: Within Functional Limits for tasks assessed    Mobility  Bed Mobility Overal bed mobility: Modified Independent Transfers Overall transfer level: Needs assistance Equipment used: None Transfers: Sit to/from [employer]Stand;Stand Pivot Transfers Sit to Stand: Min guard;Supervision Stand pivot transfers: Supervision;Min guard General transfer comment: int. assistance secondary to pt. weight shifting and pausing secondary to foot pain              End of Session OT - End of Session Activity Tolerance: Patient tolerated treatment well;Patient limited by pain Patient left: in chair;with call bell/phone within reach       Robet LeuMorris, Juanetta Negash Lorraine, COTA/L  03/16/2014, 2:08 PM

## 2014-03-16 NOTE — Progress Notes (Signed)
Patient ID: Andrea [name], female   DOB: 30-Mar-1951, 63 y.o.   MRN: [id number]  [facility] Progress Note    Assessment/ Plan:   1. Acute on CKD 3. Most likely cardiorenal syndrome- improving renal function with inotropic support (milrinone). No acute HD needs at this time (and appears she may be a marginal candidate for long term dialysis given the problems with low EF). Based on her clinical exam and rising bicarbonate level- Would start decreasing lasix dose. 2. Cardiomyopathy EF 15%: on lasix/milrinone  3. Hepatic cirrhosis apparently not a candidate for OLTX based on w/u at [facility] (although this cannot be verified from [facility]) 4. DM  5. Sec HPTH: phosphorus levels normal (not on binders). On calcitriol (PTH 299 on 3/12)    Subjective:   Reports to be feeling better and has rare shortness of breath when she is eating. Leg swelling resolved per pt.   Objective:   BP 109/61  Pulse 105  Temp(Src) 98.4 F (36.9 C) (Oral)  Resp 20  Ht 5' (1.524 m)  Wt 79.4 kg (175 lb 0.7 oz)  BMI 34.19 kg/m2  SpO2 100%  Intake/Output Summary (Last 24 hours) at 03/16/14 0834 Last data filed at 03/16/14 2703  Gross per 24 hour  Intake  417.2 ml  Output   1800 ml  Net -1382.8 ml   Weight change: -0.3 kg (-10.6 oz)  Physical Exam: JKK:[id number] laying flat in bed IRC:VELFY RRR, normal S1 and S2 Resp:Fine right base rales, otherwise CTA BOF:BPZW, obese, NT, BS normal Ext:No LE edema  Imaging: No results found.  Labs: BMET  Recent Labs Lab 03/10/14 0500 03/11/14 0515 03/12/14 0450 03/13/14 0600 03/14/14 0610 03/15/14 0500 03/16/14 0510  NA 136* 136* 135* 139 136* 137 136*  K 5.1 5.1 5.1 4.4 4.5 4.1 3.8  CL 94* 94* 96 101 98 95* 93*  CO2 20 20 18* 22 24 29 30   GLUCOSE 70 81 105* 154* 119* 158* 121*  BUN 49* 59* 64* 64* 60* 51* 47*  CREATININE 3.83* 4.49* 4.49* 4.17* 3.41* 2.65* 2.38*  CALCIUM 9.6 9.3 9.0 9.0 8.9 9.0 8.5  PHOS  --  6.8*  --   --  2.8  3.6  --    CBC  Recent Labs Lab 03/11/14 0515  WBC 6.3  HGB 11.4*  HCT 33.7*  MCV 66.3*  PLT 248   Medications:    . calcitRIOL  0.25 mcg Oral Daily  . enoxaparin (LOVENOX) injection  30 mg Subcutaneous Q24H  . furosemide  80 mg Intravenous BID  . hydrocerin   Topical BID  . insulin aspart  0-9 Units Subcutaneous TID WC  . pantoprazole  40 mg Oral Q1200  . sodium chloride  3 mL Intravenous Q12H   Zetta Bills, MD 03/16/2014, 8:34 AM

## 2014-03-16 NOTE — Progress Notes (Signed)
CARDIAC REHAB PHASE I   PRE:  Rate/Rhythm: 99 SR    BP: sitting 106/70    SaO2:   MODE:  Ambulation: 220 ft   POST:  Rate/Rhythm: 115 ST    BP: sitting 130/76     SaO2: 94 RA  Pt able to ambulate steadily with RW. Major c/o was bilateral foot pain. Sts her scalp also feels like her feet. Sts this is ongoing when she is awake. Long distance despite pt only walking in house by her account. To recliner with urging and pampering. VSS. Will continue to follow. [id number]   Andrea [name] CES, ACSM 03/16/2014 3:16 PM

## 2014-03-17 LAB — GLUCOSE, CAPILLARY
GLUCOSE-CAPILLARY: 171 mg/dL — AB (ref 70–99)
GLUCOSE-CAPILLARY: 97 mg/dL (ref 70–99)
Glucose-Capillary: 94 mg/dL (ref 70–99)
Glucose-Capillary: 136 mg/dL — ABNORMAL HIGH (ref 70–99)

## 2014-03-17 LAB — RENAL FUNCTION PANEL
Albumin: 2.6 g/dL — ABNORMAL LOW (ref 3.5–5.2)
BUN: 40 mg/dL — AB (ref 6–23)
CO2: 32 mEq/L (ref 19–32)
Calcium: 8.5 mg/dL (ref 8.4–10.5)
Chloride: 92 mEq/L — ABNORMAL LOW (ref 96–112)
Creatinine, Ser: 2.22 mg/dL — ABNORMAL HIGH (ref 0.50–1.10)
GFR calc Af Amer: 26 mL/min — ABNORMAL LOW (ref >90)
GFR calc non Af Amer: 23 mL/min — ABNORMAL LOW (ref >90)
Glucose, Bld: 96 mg/dL (ref 70–99)
POTASSIUM: 3.5 meq/L — AB (ref 3.7–5.3)
Phosphorus: 3.7 mg/dL (ref 2.3–4.6)
SODIUM: 137 meq/L (ref 137–147)

## 2014-03-17 LAB — MAGNESIUM: Magnesium: 1.1 mg/dL — ABNORMAL LOW (ref 1.5–2.5)

## 2014-03-17 MED ORDER — MAGNESIUM SULFATE 4000MG/100ML IJ SOLN
4.0000 g | Freq: Once | INTRAMUSCULAR | Status: DC
  Filled 2014-03-17: qty 100

## 2014-03-17 MED ORDER — POTASSIUM CHLORIDE 10 MEQ/100ML IV SOLN
10.0000 meq | INTRAVENOUS | Status: DC
  Filled 2014-03-17 (×3): qty 100

## 2014-03-17 MED ORDER — MAGNESIUM OXIDE 400 (241.3 MG) MG PO TABS
400.0000 mg | ORAL_TABLET | Freq: Two times a day (BID) | ORAL | Status: DC
  Administered 2014-03-17 – 2014-03-24 (×15): 400 mg via ORAL
  Filled 2014-03-17 (×16): qty 1

## 2014-03-17 MED ORDER — POTASSIUM CHLORIDE CRYS ER 20 MEQ PO TBCR
40.0000 meq | EXTENDED_RELEASE_TABLET | Freq: Once | ORAL | Status: AC
  Administered 2014-03-17: 40 meq via ORAL
  Filled 2014-03-17: qty 2

## 2014-03-17 NOTE — Progress Notes (Signed)
Patient ID: Andrea [name], female   DOB: [birth year], 63 y.o.   MRN: [id number]  [facility] PROGRESS NOTE  Andrea [name] NUU:[id number] DOB: [birth year] DOA: 03/08/2014 PCP: Lolita PatellaEADE,ROBERT ALEXANDER, MD  Brief narrative: 63 year old female with history of ischemic dilated cardiomyopathy, ICD, CAD status post stenting, CVA, DM 2, intolerant of ACE inhibitors and ARB's, prolonged hospitalization in January for worsening liver failure and [facility] but not felt to be a candidate, chronic systolic CHF, HTN, dyslipidemia, stage III chronic kidney disease, admitted on 03/08/14 with nausea and nonbloody emesis. Subsequently developed progressive renal failure with peak creatinine of 4.5 that was felt to be due to poor flow state, cardio-renal etiology, from low EF of 15%. Followed by cardiology and renal team, started on Milrinone gtt.  Assessment/Plan:  Nausea and vomiting - could have started out as gastroenetritis, then worsening likely due to poor output state  - currently reports tolerating oral intake well, denies nausea and vomiting  Acute on chronic stage III kidney disease - pt had normal creatinine on 1/18 - Cr on admission 1.35 -> 2.81 -> 3.8 -> 4.49 -> 4.1 -> 3.4 -> 2.6 -> 2.3 -> 2.2 - Renal US: suggesting medical renal disease, no hydronephrosis  - Renal following, IVF stopped 3/13  - creatinine improving on Milrinone/lasix  - continue to monitor urine output  Acute on Chronic systolic CHF/ischemic dilated cardiomyopathy/CAD/ICD: - No signs of volume overload  - continu IV milrinone/lasix per cards 3/13  Hypokalemia  - in the setting of low Mg and secondary to diuretic use - will supplement both and repeat BMP, Mg in AM Type II DM with nephropathy:  - reasonable inpatient control  History of liver failure: and cirrhosis based [facility] - LFTs unremarkable except for low albumin and mildly elevated bilirubin.  -  Extensive unremarkable workup done last admission for liver failure - based on [facility] DC summary felt to be due to Cardiomyopathy  - ammonia level is 50, continue lactulose  Foot pain/? Neuropathic  - continue Gabapentin   Code Status: Full  Family Communication: None at bedside  Disposition Plan: To be determined  Consultants:  Cardiology  Renal  CHF Procedures:  None Antibiotics:  Rocephin 3/9 > 3/10   HPI/Subjective: No events overnight.   Objective: Filed Vitals:   03/16/14 2147 03/17/14 0415 03/17/14 0417 03/17/14 1425  BP: 128/80 114/56  116/71  Pulse: 114 112  116  Temp: 98.9 F (37.2 C) 98.8 F (37.1 C)  98.5 F (36.9 C)  TempSrc: Oral Oral  Oral  Resp: 18 20  18   Height:      Weight:   78.79 kg (173 lb 11.2 oz)   SpO2: 100% 97%  97%    Intake/Output Summary (Last 24 hours) at 03/17/14 1936 Last data filed at 03/17/14 1903  Gross per 24 hour  Intake 1003.98 ml  Output      0 ml  Net 1003.98 ml    Exam:   General:  Pt is alert, follows commands appropriately, not in acute distress  Cardiovascular: Regular rate, tachycardic, no rubs, no gallops  Respiratory: Clear to auscultation bilaterally, no wheezing, no crackles, no rhonchi  Abdomen: Soft, non tender, non distended, bowel sounds present, no guarding  Extremities: no edema, pulses DP and PT palpable bilaterally  Neuro: Grossly nonfocal  Data Reviewed: Basic Metabolic Panel:  Recent Labs Lab 03/11/14 0515  03/13/14 0600 03/14/14 0610 03/15/14 0500 03/16/14 0510 03/17/14  0600  NA 136*  < > 139 136* 137 136* 137  K 5.1  < > 4.4 4.5 4.1 3.8 3.5*  CL 94*  < > 101 98 95* 93* 92*  CO2 20  < > 22 24 29 30  32  GLUCOSE 81  < > 154* 119* 158* 121* 96  BUN 59*  < > 64* 60* 51* 47* 40*  CREATININE 4.49*  < > 4.17* 3.41* 2.65* 2.38* 2.22*  CALCIUM 9.3  < > 9.0 8.9 9.0 8.5 8.5  MG  --   --   --   --   --   --  1.1*  PHOS 6.8*  --   --  2.8 3.6  --  3.7  < > = values in this interval not  displayed. Liver Function Tests:  Recent Labs Lab 03/11/14 0515 03/12/14 0450 03/13/14 0600 03/14/14 0610 03/15/14 0500 03/17/14 0600  AST 49* 132* 136*  --   --   --   ALT 25 51* 59*  --   --   --   ALKPHOS 102 95 102  --   --   --   BILITOT 2.5* 2.7* 2.3*  --   --   --   PROT 7.2 6.7 6.5  --   --   --   ALBUMIN 3.1* 2.8* 2.8* 2.9* 2.7* 2.6*    Recent Labs Lab 03/11/14 0930  AMMONIA 50   CBC:  Recent Labs Lab 03/11/14 0515  WBC 6.3  HGB 11.4*  HCT 33.7*  MCV 66.3*  PLT 248   CBG:  Recent Labs Lab 03/16/14 1626 03/16/14 2206 03/17/14 0614 03/17/14 1122 03/17/14 1631  GLUCAP 110* 90 94 171* 97    Recent Results (from the past 240 hour(s))  URINE CULTURE     Status: None   Collection Time    03/08/14  6:36 PM      Result Value Ref Range Status   Specimen Description URINE, RANDOM   Final   Special Requests NONE   Final   Culture  Setup Time     Final   Value: 03/09/2014 00:07     Performed at [facility] Count     Final   Value: 90,000 COLONIES/ML     Performed at [facility]   Culture     Final   Value: ENTEROCOCCUS SPECIES     Performed at [facility]   Report Status 03/12/2014 FINAL   Final   Organism ID, Bacteria ENTEROCOCCUS SPECIES   Final     Scheduled Meds: . calcitRIOL  0.25 mcg Oral Daily  . enoxaparin (LOVENOX) injection  30 mg Subcutaneous Q24H  . furosemide  80 mg Oral Daily  . gabapentin  100 mg Oral BID  . hydrocerin   Topical BID  . insulin aspart  0-9 Units Subcutaneous TID WC  . magnesium oxide  400 mg Oral BID  . magnesium sulfate 1 - 4 g bolus IVPB  4 g Intravenous Once  . pantoprazole  40 mg Oral Q1200  . sodium chloride  3 mL Intravenous Q12H   Continuous Infusions: . milrinone 0.25 mcg/kg/min (03/17/14 0710)   Debbora Presto, MD  [facility] Pager [phone]  If 7PM-7AM, please contact night-coverage [email or web address] Password [facility] 03/17/2014, 7:36 PM   LOS: 9 days

## 2014-03-17 NOTE — Progress Notes (Signed)
Physical Therapy Treatment Patient Details Name: Andrea [name] MRN: [id number] DOB: [birth year] Today's Date: 03/17/2014 Time: 7711-6579 PT Time Calculation (min): 15 min  PT Assessment / Plan / Recommendation  History of Present Illness 63 year old female patient with history of ischemic dilated cardiomyopathy, ICD, CAD status post stenting, CVA, DM 2, intolerant of ACE inhibitors and ARB's, prolonged hospitalization in January for worsening liver failure and transferred to [facility] for consideration of liver transplant but not felt to be a candidate (note records available), chronic systolic CHF, HTN, dyslipidemia, stage III chronic kidney disease was admitted on 03/08/14 with complaints of nausea and nonbloody emesis. No sickly contacts. She also had some dyspnea.   PT Comments   Needs lots of encouragement to participate due to painful feet.  Notably painful appearing gait, but pt generally steady through out.   Follow Up Recommendations  No PT follow up;Supervision - Intermittent     Does the patient have the potential to tolerate intense rehabilitation     Barriers to Discharge        Equipment Recommendations  None recommended by PT    Recommendations for Other Services    Frequency Min 3X/week   Progress towards PT Goals Progress towards PT goals: Progressing toward goals (but takes lots of encouragement)  Plan Current plan remains appropriate    Precautions / Restrictions     Pertinent Vitals/Pain     Mobility  Bed Mobility Overal bed mobility: Modified Independent General bed mobility comments: No assist or rails needed Transfers Overall transfer level: Needs assistance Transfers: Sit to/from Stand Sit to Stand: Min guard;Supervision General transfer comment: feet so painful, pt slow and guarded with stand. Ambulation/Gait Ambulation/Gait assistance: Min guard Ambulation Distance (Feet): 140 Feet Assistive device: Rolling walker (2 wheeled);None (pushed IV pole  to the door-- much more painful appearing) Gait Pattern/deviations: Step-through pattern;Antalgic Gait velocity: Slow Gait velocity interpretation: Below normal speed for age/gender    Exercises     PT Diagnosis:    PT Problem List:   PT Treatment Interventions:     PT Goals (current goals can now be found in the care plan section) Acute Rehab PT Goals Patient Stated Goal: to be able to feel stronger Time For Goal Achievement: 03/19/14 Potential to Achieve Goals: Good  Visit Information  Last PT Received On: 03/17/14 Assistance Needed: +1 History of Present Illness: 63 year old female patient with history of ischemic dilated cardiomyopathy, ICD, CAD status post stenting, CVA, DM 2, intolerant of ACE inhibitors and ARB's, prolonged hospitalization in January for worsening liver failure and transferred to [facility] for consideration of liver transplant but not felt to be a candidate (note records available), chronic systolic CHF, HTN, dyslipidemia, stage III chronic kidney disease was admitted on 03/08/14 with complaints of nausea and nonbloody emesis. No sickly contacts. She also had some dyspnea.    Subjective Data  Subjective: My feet are still swollen Patient Stated Goal: to be able to feel stronger   Cognition  Cognition Arousal/Alertness: Awake/alert Behavior During Therapy: WFL for tasks assessed/performed Overall Cognitive Status: Within Functional Limits for tasks assessed    Balance  Balance Overall balance assessment: No apparent balance deficits (not formally assessed) Sitting-balance support: Feet supported Sitting balance-Leahy Scale: Good  End of Session PT - End of Session Activity Tolerance: Patient limited by pain;Patient tolerated treatment well Patient left: in bed;with call bell/phone within reach Nurse Communication: Mobility status   GP     Andrea [name], Andrea [name] 03/17/2014, 2:49 PM 03/17/2014  Andrea [name],  PT [phone]  (pager)

## 2014-03-17 NOTE — Progress Notes (Signed)
Patient Name: MARYJO GIRARDI Date of Encounter: 03/17/2014   SUBJECTIVE  Switched to po lasix yesterday. Feels well  No CP or SOB. No orthopnea or PND. Remains on milrinone. Weight trending down. Renal function improving.  CURRENT MEDS . calcitRIOL  0.25 mcg Oral Daily  . enoxaparin (LOVENOX) injection  30 mg Subcutaneous Q24H  . furosemide  80 mg Oral Daily  . gabapentin  100 mg Oral BID  . hydrocerin   Topical BID  . insulin aspart  0-9 Units Subcutaneous TID WC  . magnesium oxide  400 mg Oral BID  . pantoprazole  40 mg Oral Q1200  . potassium chloride  40 mEq Oral Once  . sodium chloride  3 mL Intravenous Q12H    OBJECTIVE  Filed Vitals:   03/16/14 2147 03/17/14 0415 03/17/14 0417 03/17/14 1425  BP: 128/80 114/56  116/71  Pulse: 114 112  116  Temp: 98.9 F (37.2 C) 98.8 F (37.1 C)  98.5 F (36.9 C)  TempSrc: Oral Oral  Oral  Resp: 18 20  18   Height:      Weight:   173 lb 11.2 oz (78.79 kg)   SpO2: 100% 97%  97%    Intake/Output Summary (Last 24 hours) at 03/17/14 1621 Last data filed at 03/17/14 1425  Gross per 24 hour  Intake 763.48 ml  Output      0 ml  Net 763.48 ml   Filed Weights   03/15/14 0444 03/16/14 0437 03/17/14 0417  Weight: 181 lb 10.5 oz (82.4 kg) 175 lb 0.7 oz (79.4 kg) 173 lb 11.2 oz (78.79 kg)    PHYSICAL EXAM  General: Pleasant, NAD. Standing up taking sponge bath Neuro: Alert and oriented X 3. Moves all extremities spontaneously. Psych: Normal affect. HEENT:  Normal  Neck: Supple without bruits JVP 19flat  Lungs:  Resp regular and unlabored, CTA. Heart: RRR no s3, s4, or murmurs. Tachycardic  Abdomen: Soft, tender, non-distended, BS + x 4.  Extremities: No clubbing, cyanosis or edema. DP/PT/Radials 2+ and equal bilaterally.  Accessory Clinical Findings  CBC  Basic Metabolic Panel  Recent Labs  03/15/14 0500 03/16/14 0510 03/17/14 0600  NA 137 136* 137  K 4.1 3.8 3.5*  CL 95* 93* 92*  CO2 29 30 32  GLUCOSE 158*  121* 96  BUN 51* 47* 40*  CREATININE 2.65* 2.38* 2.22*  CALCIUM 9.0 8.5 8.5  MG  --   --  1.1*  PHOS 3.6  --  3.7   Liver Function Tests  Recent Labs  03/15/14 0500 03/17/14 0600  ALBUMIN 2.7* 2.6*    TELE  Sinus tach, 110s freq PVCs   Radiology/Studies  Dg Chest 2 View  03/08/2014   CLINICAL DATA:  Shortness of breath.  EXAM: CHEST  2 VIEW  COMPARISON:  CT ABD/PELV WO CM dated 01/18/2014; DG CHEST 2 VIEW dated 01/17/2014  FINDINGS: AICD noted with lead tip projected over right ventricle.Cardiomegaly with normal pulmonary vascularity. No focal infiltrate. No pleural effusion or pneumothorax. No acute osseous abnormality. Prior cervical spine fusion.  IMPRESSION: 1. Stable severe cardiomegaly. AICD noted in stable position. No CHF. 2. Interim clearing of small right pleural effusion.   Electronically Signed   By: Maisie Fus  Register   On: 03/08/2014 08:23   US Renal  03/10/2014   CLINICAL DATA Acute kidney injury  EXAM RENAL/URINARY TRACT ULTRASOUND COMPLETE  COMPARISON 01/20/2014  FINDINGS Right Kidney:  Length: 12.4 cm. Lobular contour. Echogenic renal parenchyma, suggesting medical renal disease.  No mass or hydronephrosis.  Left Kidney:  Length: 11.5 cm. Lobular contour. Echogenic renal parenchyma, suggesting medical renal disease. No mass or hydronephrosis.  Bladder:  Underdistended but grossly unremarkable.  IMPRESSION Echogenic renal parenchyma, suggesting medical renal disease.  No hydronephrosis.  SIGNATURE  Electronically Signed   By: Charline BillsSriyesh  Krishnan M.D.   On: 03/10/2014 17:11    ASSESSMENT AND PLAN [name] Merry is a 63 y.o. 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   1. CAD: s/p PCI with DES to prox RCA (2012) and stent to dist RCA (2012). She is a nonresponder to Plavix. She does not tolerate Brillinta because of  wheezing and she cannot receive Effient because of a CVA history. ASA allergy -- No angina, No evidence for acute ischemia.   2. Acute on Chronic systlic CHF: Her Echo shows an EF of 15%. See below for more details  3. Acute on chronic renal failure stage WU:[id number]:suspected to be hemodynamically mediated by low BP in face of poor CO.  -- Creat greatly improved on milrinone. 4.49--> 4.17-->3.41-->2.65-> 2.38 -> 2.2  4. History of liver failure: and cirrhosis [facility] -- LFTs unremarkable except for low albumin and mildly elevated bilirubin.  -- Extensive unremarkable workup done last admission for liver failure which was unrevealing  -- Bbased on [facility] DC summary felt to be due to Cardiomyopathy   5. Hypomagnesemia  Difficult situation. Renal function improving on milrinone but rate of improvement plateauing. I doubt she will be candidate for advanced therapies or HD if she would get to that point. I think only real option for her may be home milrinone. I discussed this with her and she is willing to consider. Will wait until renal function plateaus and then attempt to wean milrinone. If fails wean will need RHC and consideration of home inotropic support. Mag 1.1. Mag oxide ordered. Will give her some IV mag.     Gerrie Castiglia,MD 4:21 PM

## 2014-03-17 NOTE — Progress Notes (Signed)
Patient ID: Andrea [name], female   DOB: Sep 27, 1951, 63 y.o.   MRN: [id number]  [facility] Progress Note    Assessment/ Plan:   1. Acute on CKD 3. Likely cardiorenal syndrome-renal function continues to improve with inotropic support (milrinone). Furosemide converted to oral yesterday and decreasing urine output noted however, patient continues to be net negative. Long-term management appears to be challenging due to barriers outlined by cardiology. She is a marginal candidate for long term dialysis given limited insight and ability. No acute electrolyte issues at this time/no indication for dialysis.   2. Cardiomyopathy EF 15%: on oral lasix and continued milrinone drip. Appears that long-term/outpatient management will be very challenging given the limited insight that the patient has. 3. Hepatic cirrhosis apparently not a candidate for OLTX based on w/u at [facility] (although this cannot be verified from [facility] records)  4. DM : Fair glycemic control-on sliding scale insulin and diabetic diet. 5. Sec HPTH: phosphorus levels normal (not on binders). On calcitriol (PTH 299 on 3/12)  6. Hypokalemia: From diuretic induced losses and anticipate to drop further-we'll add on a magnesium level from this morning and give oral potassium.   Subjective:   Reports to be feeling better with regards to her breathing-still not able to test out her ability to tolerate exertion as she is reluctant to participate with physical therapy due to foot pain   Objective:   BP 114/56  Pulse 112  Temp(Src) 98.8 F (37.1 C) (Oral)  Resp 20  Ht 5' (1.524 m)  Wt 78.79 kg (173 lb 11.2 oz)  BMI 33.92 kg/m2  SpO2 97%  Intake/Output Summary (Last 24 hours) at 03/17/14 4734 Last data filed at 03/16/14 1810  Gross per 24 hour  Intake 523.48 ml  Output    850 ml  Net -326.52 ml   Weight change: -0.61 kg (-1 lb 5.5 oz)  Physical Exam: Gen: Comfortably resting in bed CVS: Pulse regular tachycardia,  S1 and S2 normal Resp: Clear to auscultation bilaterally-no rales/rhonchi Abd: Soft, obese, nontender and bowel sounds are normal Ext: No edema over the lower extremities  Imaging: No results found.  Labs: BMET  Recent Labs Lab 03/11/14 0515 03/12/14 0450 03/13/14 0600 03/14/14 0610 03/15/14 0500 03/16/14 0510 03/17/14 0600  NA 136* 135* 139 136* 137 136* 137  K 5.1 5.1 4.4 4.5 4.1 3.8 3.5*  CL 94* 96 101 98 95* 93* 92*  CO2 20 18* 22 24 29 30  32  GLUCOSE 81 105* 154* 119* 158* 121* 96  BUN 59* 64* 64* 60* 51* 47* 40*  CREATININE 4.49* 4.49* 4.17* 3.41* 2.65* 2.38* 2.22*  CALCIUM 9.3 9.0 9.0 8.9 9.0 8.5 8.5  PHOS 6.8*  --   --  2.8 3.6  --  3.7   CBC  Recent Labs Lab 03/11/14 0515  WBC 6.3  HGB 11.4*  HCT 33.7*  MCV 66.3*  PLT 248    Medications:    . calcitRIOL  0.25 mcg Oral Daily  . enoxaparin (LOVENOX) injection  30 mg Subcutaneous Q24H  . furosemide  80 mg Oral Daily  . gabapentin  100 mg Oral BID  . hydrocerin   Topical BID  . insulin aspart  0-9 Units Subcutaneous TID WC  . pantoprazole  40 mg Oral Q1200  . sodium chloride  3 mL Intravenous Q12H   Zetta Bills, MD 03/17/2014, 7:09 AM

## 2014-03-18 LAB — MAGNESIUM: Magnesium: 1.2 mg/dL — ABNORMAL LOW (ref 1.5–2.5)

## 2014-03-18 LAB — CBC
HEMATOCRIT: 33 % — AB (ref 36.0–46.0)
HEMOGLOBIN: 11 g/dL — AB (ref 12.0–15.0)
MCH: 22.3 pg — AB (ref 26.0–34.0)
MCHC: 33.3 g/dL (ref 30.0–36.0)
MCV: 66.8 fL — ABNORMAL LOW (ref 78.0–100.0)
Platelets: 177 10*3/uL (ref 150–400)
RBC: 4.94 MIL/uL (ref 3.87–5.11)
RDW: 20.6 % — ABNORMAL HIGH (ref 11.5–15.5)
WBC: 5.8 10*3/uL (ref 4.0–10.5)

## 2014-03-18 LAB — BASIC METABOLIC PANEL
BUN: 40 mg/dL — ABNORMAL HIGH (ref 6–23)
CO2: 29 meq/L (ref 19–32)
Calcium: 8.8 mg/dL (ref 8.4–10.5)
Chloride: 92 mEq/L — ABNORMAL LOW (ref 96–112)
Creatinine, Ser: 2.05 mg/dL — ABNORMAL HIGH (ref 0.50–1.10)
GFR calc Af Amer: 29 mL/min — ABNORMAL LOW (ref >90)
GFR calc non Af Amer: 25 mL/min — ABNORMAL LOW (ref >90)
GLUCOSE: 103 mg/dL — AB (ref 70–99)
POTASSIUM: 4.2 meq/L (ref 3.7–5.3)
SODIUM: 135 meq/L — AB (ref 137–147)

## 2014-03-18 LAB — GLUCOSE, CAPILLARY
GLUCOSE-CAPILLARY: 113 mg/dL — AB (ref 70–99)
GLUCOSE-CAPILLARY: 155 mg/dL — AB (ref 70–99)
Glucose-Capillary: 147 mg/dL — ABNORMAL HIGH (ref 70–99)

## 2014-03-18 MED ORDER — ISOSORBIDE MONONITRATE 15 MG HALF TABLET
15.0000 mg | ORAL_TABLET | Freq: Every day | ORAL | Status: DC
Start: 2014-03-18 — End: 2014-03-18

## 2014-03-18 MED ORDER — MAGNESIUM SULFATE 40 MG/ML IJ SOLN
2.0000 g | Freq: Once | INTRAMUSCULAR | Status: DC
  Filled 2014-03-18: qty 50

## 2014-03-18 MED ORDER — HYDRALAZINE HCL 10 MG PO TABS: 10.0000 mg | ORAL_TABLET | Freq: Three times a day (TID) | ORAL | Status: DC

## 2014-03-18 NOTE — Progress Notes (Signed)
Patient Name: Andrea [name] Date of Encounter: 03/18/2014   SUBJECTIVE  Stable overnight. Received Mag oxide yesterday. Remains on milrinone and Cr continues to trend down 2.02. No complaints. Denies SOB, orthopnea or CP. Weight up 2 lbs. SBP 115-120s.  CURRENT MEDS . calcitRIOL  0.25 mcg Oral Daily  . enoxaparin (LOVENOX) injection  30 mg Subcutaneous Q24H  . furosemide  80 mg Oral Daily  . gabapentin  100 mg Oral BID  . hydrocerin   Topical BID  . insulin aspart  0-9 Units Subcutaneous TID WC  . magnesium oxide  400 mg Oral BID  . magnesium sulfate 1 - 4 g bolus IVPB  4 g Intravenous Once  . pantoprazole  40 mg Oral Q1200  . sodium chloride  3 mL Intravenous Q12H    OBJECTIVE  Filed Vitals:   03/17/14 0417 03/17/14 1425 03/18/14 0426 03/18/14 0429  BP:  116/71 124/62   Pulse:  116 114   Temp:  98.5 F (36.9 C) 99 F (37.2 C)   TempSrc:  Oral Oral   Resp:  18 20   Height:      Weight: 173 lb 11.2 oz (78.79 kg)   175 lb 7.8 oz (79.6 kg)  SpO2:  97% 97%     Intake/Output Summary (Last 24 hours) at 03/18/14 0722 Last data filed at 03/18/14 0277  Gross per 24 hour  Intake 1003.98 ml  Output   1000 ml  Net   3.98 ml   Filed Weights   03/16/14 0437 03/17/14 0417 03/18/14 0429  Weight: 175 lb 0.7 oz (79.4 kg) 173 lb 11.2 oz (78.79 kg) 175 lb 7.8 oz (79.6 kg)    PHYSICAL EXAM  General: Flat affect, NAD. Lying flat Neuro: Alert and oriented X 3. Moves all extremities spontaneously. Psych: Normal affect. HEENT:  Normal  Neck: Supple without bruits JVP 7   Lungs:  Resp regular and unlabored, CTA. Heart: RRR no s3, s4, or murmurs. Tachycardic  Abdomen: Soft, tender, non-distended, BS + x 4.  Extremities: No clubbing, cyanosis or edema. DP/PT/Radials 2+ and equal bilaterally.  Accessory Clinical Findings  CBC  Basic Metabolic Panel  Recent Labs  03/17/14 0600 03/18/14 0500  NA 137 135*  K 3.5* 4.2  CL 92* 92*  CO2 32 29  GLUCOSE 96 103*  BUN  40* 40*  CREATININE 2.22* 2.05*  CALCIUM 8.5 8.8  MG 1.1* 1.2*  PHOS 3.7  --    Liver Function Tests  Recent Labs  03/17/14 0600  ALBUMIN 2.6*    TELE  Sinus tach, 110s freq PVCs   Radiology/Studies  Dg Chest 2 View  03/08/2014   CLINICAL DATA:  Shortness of breath.  EXAM: CHEST  2 VIEW  COMPARISON:  CT ABD/PELV WO CM dated 01/18/2014; DG CHEST 2 VIEW dated 01/17/2014  FINDINGS: AICD noted with lead tip projected over right ventricle.Cardiomegaly with normal pulmonary vascularity. No focal infiltrate. No pleural effusion or pneumothorax. No acute osseous abnormality. Prior cervical spine fusion.  IMPRESSION: 1. Stable severe cardiomegaly. AICD noted in stable position. No CHF. 2. Interim clearing of small right pleural effusion.   Electronically Signed   By: Maisie Fus  Register   On: 03/08/2014 08:23   US Renal  03/10/2014   CLINICAL DATA Acute kidney injury  EXAM RENAL/URINARY TRACT ULTRASOUND COMPLETE  COMPARISON 01/20/2014  FINDINGS Right Kidney:  Length: 12.4 cm. Lobular contour. Echogenic renal parenchyma, suggesting medical renal disease. No mass or hydronephrosis.  Left Kidney:  Length:  11.5 cm. Lobular contour. Echogenic renal parenchyma, suggesting medical renal disease. No mass or hydronephrosis.  Bladder:  Underdistended but grossly unremarkable.  IMPRESSION Echogenic renal parenchyma, suggesting medical renal disease.  No hydronephrosis.  SIGNATURE  Electronically Signed   By: Charline BillsSriyesh  Krishnan M.D.   On: 03/10/2014 17:11    ASSESSMENT AND PLAN Andrea [name] is a 63 y.o. 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   1. CAD: s/p PCI with DES to prox RCA (2012) and stent to dist RCA (2012). She is a nonresponder to Plavix. She does not tolerate Brillinta because of wheezing and she cannot receive Effient because of a CVA  history. ASA allergy -- No angina, No evidence for acute ischemia.   2. Acute on Chronic systlic CHF: Her Echo shows an EF of 15% - Volume status appears stable will continue PO lasix. - Not on BB currently, on milrinone.  - SBP 110-120s will hold off on starting hydralazine/nitrates in order to provide BP for kidneys. If able to tolerate wean of milrinone and Cr remains stable then can try to add low dose.   - No ACE-I d/t CKD. - Cr continues to improve. Will continue milrinone until Cr plateaus and then will try to wean off. If fails wean will need RHC and consideration of home inotropic support.  - Ambulate with CR  3. Acute on chronic renal failure stage [id number]:suspected to be hemodynamically mediated by low BP in face of poor CO.  -- Creat greatly improved on milrinone. 4.49--> 4.17-->3.41-->2.65-> 2.38 -> 2.2>2.02  4. History of liver failure: and cirrhosis [facility] -- LFTs unremarkable except for low albumin and mildly elevated bilirubin.  -- Extensive unremarkable workup done last admission for liver failure which was unrevealing  -- Bbased on [facility] DC summary felt to be due to Cardiomyopathy   5. Hypomagnesemia-  - supplemented yesterday with IV mag.    Aundria Rudosgrove, Ali B NP-C 7:22 AM  Patient seen and examined with Ulla PotashAli Cosgrove, NP. We discussed all aspects of the encounter. I agree with the assessment and plan as stated above.   Renal function continue to improve on milrinone. Volume status stable. Continue current regimen including milrinone. Wait to see how good renal function will get before weaning inotropes. Once milrinone weaned will need RHC - suspect this will be early next week. Not candidate for advanced therapies.   Daniel Bensimhon,MD 7:51 AM

## 2014-03-18 NOTE — Progress Notes (Signed)
Patient ID: Andrea [name], female   DOB: November 20, 1951, 63 y.o.   MRN: [id number]   [facility] Progress Note    Assessment/ Plan:   1. Acute on CKD 3. Likely cardiorenal syndrome-renal function still continues to improve with inotropic support (milrinone) and oral lasix. Appears she be nearing her new "baseline" creatinine after reviewing her labs from [facility]. We had a discussion about chronic dialysis and she informed me that she would choose to die "than being tied to a dialysis machine for the rest of her life" (even after I told her about exactly how dialysis would work).   2. Cardiomyopathy EF 15%: on oral lasix and continued milrinone drip. Likely to need chronic milrinone gtt on DC.  3. Hepatic cirrhosis apparently not a candidate for OLTX based on w/u at [facility] (although this cannot be verified from [facility] records)  4. DM : Fair glycemic control-on sliding scale insulin and diabetic diet.  5. Sec HPTH: phosphorus levels normal (not on binders). On calcitriol (PTH 299 on 3/12)  6. Hypokalemia: From diuretic induced losses and corrected with PO potassium.  Subjective:   States that she is comfortable- no complaints   Objective:   BP 124/62  Pulse 114  Temp(Src) 99 F (37.2 C) (Oral)  Resp 20  Ht 5' (1.524 m)  Wt 79.6 kg (175 lb 7.8 oz)  BMI 34.27 kg/m2  SpO2 97%  Intake/Output Summary (Last 24 hours) at 03/18/14 0721 Last data filed at 03/18/14 9629  Gross per 24 hour  Intake 1003.98 ml  Output   1000 ml  Net   3.98 ml   Weight change: 0.81 kg (1 lb 12.6 oz)  Physical Exam: BMW:UXLKGMW in bed- pleasant NUU:VOZDG RRR, nonrmal S1 and S2 Resp:CTA bilaterally, no rales/rhonchi UYQ:IHKV, obese, NT, BS normal Ext:No LE edema  Imaging: No results found.  Labs: BMET  Recent Labs Lab 03/12/14 0450 03/13/14 0600 03/14/14 0610 03/15/14 0500 03/16/14 0510 03/17/14 0600 03/18/14 0500  NA 135* 139 136* 137 136* 137 135*  K 5.1 4.4 4.5 4.1 3.8 3.5* 4.2   CL 96 101 98 95* 93* 92* 92*  CO2 18* 22 24 29 30  32 29  GLUCOSE 105* 154* 119* 158* 121* 96 103*  BUN 64* 64* 60* 51* 47* 40* 40*  CREATININE 4.49* 4.17* 3.41* 2.65* 2.38* 2.22* 2.05*  CALCIUM 9.0 9.0 8.9 9.0 8.5 8.5 8.8  PHOS  --   --  2.8 3.6  --  3.7  --    CBC  Recent Labs Lab 03/18/14 0500  WBC 5.8  HGB 11.0*  HCT 33.0*  MCV 66.8*  PLT 177   Medications:    . calcitRIOL  0.25 mcg Oral Daily  . enoxaparin (LOVENOX) injection  30 mg Subcutaneous Q24H  . furosemide  80 mg Oral Daily  . gabapentin  100 mg Oral BID  . hydrocerin   Topical BID  . insulin aspart  0-9 Units Subcutaneous TID WC  . magnesium oxide  400 mg Oral BID  . magnesium sulfate 1 - 4 g bolus IVPB  4 g Intravenous Once  . pantoprazole  40 mg Oral Q1200  . sodium chloride  3 mL Intravenous Q12H   Zetta Bills, MD 03/18/2014, 7:21 AM

## 2014-03-18 NOTE — Care Management Note (Unsigned)
    Page 1 of 2   03/24/2014     12:13:55 PM   CARE MANAGEMENT NOTE 03/24/2014  Patient:  Andrea [name], Andrea [name]   Account Number:  [id number]  Date Initiated:  03/15/2014  Documentation initiated by:  Ayvah Caroll  Subjective/Objective Assessment:   PT ADM ON 03/08/14 WITH ACUTE ON CHRONIC CHF.  PTA, PT RESIDES AT HOME WITH PARTNER.     Action/Plan:   WILL FOLLOW FOR DC NEEDS AS PT PROGRESSES.   Anticipated DC Date:  03/23/2014   Anticipated DC Plan:  HOME W HOME HEALTH SERVICES      DC Planning Services  CM consult      [facility] Choice  HOME HEALTH   Choice offered to / List presented to:  C-1 Patient   DME arranged  IV PUMP/EQUIPMENT  [facility] - ROLLING      DME agency  [facility]     HH arranged  HH-1 RN  HH-2 PT      [facility] agency  [facility]   Status of service:  In process, will continue to follow Medicare Important Message given?   (If response is "NO", the following Medicare IM given date fields will be blank) Date Medicare IM given:   Date Additional Medicare IM given:    Discharge Disposition:    Per UR Regulation:  Reviewed for med. necessity/level of care/duration of stay  If discussed at Long Length of Stay Meetings, dates discussed:   03/16/2014  03/18/2014  03/23/2014    Comments:  03/23/14 Azan Maneri,RN,BSN [phone] PT UNABLE TO WEAN FROM MILRINONE; WILL NEED HOME THERAPY. [facility] FOLLOWING; PLAN FOR DC  ON WED 3/25 WITH MILRIONONE PUMP.  PT REQUESTING ROLLATOR FOR HOME; WILL NOTIFY [facility] DME.   03/18/14 Arris Meyn,RN,BSN [phone] P.T. RECOMMENDING NO FOLLOW UP AT DC; MAY NEED HOME MILRINONE IF UNABLE TO WEAN.  WILL FOLLOW.

## 2014-03-18 NOTE — Progress Notes (Signed)
Patient ID: Andrea [name], female   DOB: July 05, 1951, 63 y.o.   MRN: [id number]  [facility] PROGRESS NOTE  Andrea HeadingsCorinne G Sperl MVH:[id number] DOB: July 05, 1951 DOA: 03/08/2014 PCP: Lolita PatellaEADE,ROBERT ALEXANDER, MD  Brief narrative:  63 year old female with history of ischemic dilated cardiomyopathy, ICD, CAD status post stenting, CVA, DM 2, intolerant of ACE inhibitors and ARB's, prolonged hospitalization in January for worsening liver failure and [facility] but not felt to be a candidate, chronic systolic CHF, HTN, dyslipidemia, stage III chronic kidney disease, admitted on 03/08/14 with nausea and nonbloody emesis. Subsequently developed progressive renal failure with peak creatinine of 4.5 that was felt to be due to poor flow state, cardio-renal etiology, from low EF of 15%. Followed by cardiology and renal team, started on Milrinone gtt.   Assessment/Plan:  Acute on Chronic systolic CHF/ischemic dilated cardiomyopathy/CAD/ICD:  - No signs of volume overload  - continu IV milrinone/lasix per cardiology team  Acute on chronic stage III kidney disease  - pt had normal creatinine on 1/18  - Cr on admission 1.35 -> 2.81 -> 3.8 -> 4.49 -> 4.1 -> 3.4 -> 2.6 -> 2.3 -> 2.2 --> 2.0 - Renal US: suggesting medical renal disease, no hydronephrosis  - Renal following, IVF stopped 3/13  - creatinine improving on Milrinone/lasix  - continue to monitor urine output  CAD - management per cardiology  - s/p PCI with DES to prox RCA (2012) and stent to dist RCA (2012). nonresponder to Plavix.  - no evidence of acute ischemia.  Hypokalemia/Hypomagnesemia  - potassium supplemented and WNL this AM - Mg still low, will supplement today as well  Nausea and vomiting  - could have started out as gastroenetritis, then worsening likely due to poor output state  - currently reports tolerating oral intake well, denies nausea and vomiting  Type II DM with nephropathy:   - reasonable inpatient control  History of liver failure: and cirrhosis based [facility]  - LFTs unremarkable except for low albumin and mildly elevated bilirubin.  - Extensive unremarkable workup done last admission for liver failure  - based on [facility] DC summary felt to be due to Cardiomyopathy  - ammonia level is 50, continue lactulose  Foot pain/? Neuropathic  - continue Gabapentin   Code Status: Full  Family Communication: None at bedside  Disposition Plan: To be determined   Consultants:  Cardiology  Renal  CHF Procedures:  None Antibiotics:  Rocephin 3/9 > 3/10   HPI/Subjective: No events overnight.   Objective: Filed Vitals:   03/17/14 1425 03/18/14 0426 03/18/14 0429 03/18/14 1351  BP: 116/71 124/62  128/56  Pulse: 116 114  116  Temp: 98.5 F (36.9 C) 99 F (37.2 C)  97.9 F (36.6 C)  TempSrc: Oral Oral  Oral  Resp: 18 20  18   Height:      Weight:   79.6 kg (175 lb 7.8 oz)   SpO2: 97% 97%  99%    Intake/Output Summary (Last 24 hours) at 03/18/14 1445 Last data filed at 03/18/14 1341  Gross per 24 hour  Intake 883.98 ml  Output   1601 ml  Net -717.02 ml    Exam:   General:  Pt is alert, follows commands appropriately, not in acute distress  Cardiovascular: Regular rhythm, tachycardic, S1/S2, no murmurs, no rubs, no gallops  Respiratory: Clear to auscultation bilaterally, no wheezing, no crackles, no rhonchi  Abdomen: Soft, non tender, non distended, bowel sounds present,  no guarding  Extremities: No edema, pulses DP and PT palpable bilaterally  Data Reviewed: Basic Metabolic Panel:  Recent Labs Lab 03/14/14 0610 03/15/14 0500 03/16/14 0510 03/17/14 0600 03/18/14 0500  NA 136* 137 136* 137 135*  K 4.5 4.1 3.8 3.5* 4.2  CL 98 95* 93* 92* 92*  CO2 24 29 30  32 29  GLUCOSE 119* 158* 121* 96 103*  BUN 60* 51* 47* 40* 40*  CREATININE 3.41* 2.65* 2.38* 2.22* 2.05*  CALCIUM 8.9 9.0 8.5 8.5 8.8  MG  --   --   --  1.1* 1.2*  PHOS  2.8 3.6  --  3.7  --    Liver Function Tests:  Recent Labs Lab 03/12/14 0450 03/13/14 0600 03/14/14 0610 03/15/14 0500 03/17/14 0600  AST 132* 136*  --   --   --   ALT 51* 59*  --   --   --   ALKPHOS 95 102  --   --   --   BILITOT 2.7* 2.3*  --   --   --   PROT 6.7 6.5  --   --   --   ALBUMIN 2.8* 2.8* 2.9* 2.7* 2.6*   CBC:  Recent Labs Lab 03/18/14 0500  WBC 5.8  HGB 11.0*  HCT 33.0*  MCV 66.8*  PLT 177   CBG:  Recent Labs Lab 03/17/14 1122 03/17/14 1631 03/17/14 2035 03/18/14 0632 03/18/14 1200  GLUCAP 171* 97 136* 113* 147*    Recent Results (from the past 240 hour(s))  URINE CULTURE     Status: None   Collection Time    03/08/14  6:36 PM      Result Value Ref Range Status   Specimen Description URINE, RANDOM   Final   Special Requests NONE   Final   Culture  Setup Time     Final   Value: 03/09/2014 00:07     Performed at [facility] Count     Final   Value: 90,000 COLONIES/ML     Performed at [facility]   Culture     Final   Value: ENTEROCOCCUS SPECIES     Performed at [facility]   Report Status 03/12/2014 FINAL   Final   Organism ID, Bacteria ENTEROCOCCUS SPECIES   Final     Scheduled Meds: . calcitRIOL  0.25 mcg Oral Daily  . enoxaparin (LOVENOX) injection  30 mg Subcutaneous Q24H  . furosemide  80 mg Oral Daily  . gabapentin  100 mg Oral BID  . hydrocerin   Topical BID  . insulin aspart  0-9 Units Subcutaneous TID WC  . magnesium oxide  400 mg Oral BID  . magnesium sulfate 1 - 4 g bolus IVPB  4 g Intravenous Once  . pantoprazole  40 mg Oral Q1200  . sodium chloride  3 mL Intravenous Q12H   Continuous Infusions: . milrinone 0.25 mcg/kg/min (03/17/14 0710)   Debbora Presto, MD  [facility] Pager [phone]  If 7PM-7AM, please contact night-coverage [email or web address] Password [email or web address] 03/18/2014, 2:45 PM   LOS: 10 days

## 2014-03-18 NOTE — Progress Notes (Addendum)
CARDIAC REHAB PHASE I   PRE:  Rate/Rhythm: 112 ST  BP:  Supine:   Sitting: 104/70  Standing:    SaO2: 95 RA  MODE:  Ambulation: few steps in room   POST:  Rate/Rhythm: 122  BP:  Supine:   Sitting: 120/80  Standing:    SaO2: 96 RA 1045-1110 On arrival pt in bed, states that it feels like a intersection in her room. Pt continues to c/o of left foot pain, but she was willing to try to ambulate. She was only able to take a few steps from the bed. Pt states that it was to painful to bear weight on it. Pt would not go to recliner states that it hurt her bottom to sit in it. Pt back to bed after walk with call light in reach. She also c/o of nausea no vomiting. Pt states that it was gout pain in her foot.  Melina Copa RN 03/18/2014 11:06 AM

## 2014-03-19 LAB — BASIC METABOLIC PANEL
BUN: 37 mg/dL — ABNORMAL HIGH (ref 6–23)
CO2: 31 mEq/L (ref 19–32)
Calcium: 9.1 mg/dL (ref 8.4–10.5)
Chloride: 96 mEq/L (ref 96–112)
Creatinine, Ser: 2.04 mg/dL — ABNORMAL HIGH (ref 0.50–1.10)
GFR, EST AFRICAN AMERICAN: 29 mL/min — AB (ref >90)
GFR, EST NON AFRICAN AMERICAN: 25 mL/min — AB (ref >90)
Glucose, Bld: 112 mg/dL — ABNORMAL HIGH (ref 70–99)
POTASSIUM: 4.5 meq/L (ref 3.7–5.3)
SODIUM: 139 meq/L (ref 137–147)

## 2014-03-19 LAB — CBC
HCT: 33 % — ABNORMAL LOW (ref 36.0–46.0)
Hemoglobin: 10.8 g/dL — ABNORMAL LOW (ref 12.0–15.0)
MCH: 22 pg — ABNORMAL LOW (ref 26.0–34.0)
MCHC: 32.7 g/dL (ref 30.0–36.0)
MCV: 67.3 fL — ABNORMAL LOW (ref 78.0–100.0)
PLATELETS: 161 10*3/uL (ref 150–400)
RBC: 4.9 MIL/uL (ref 3.87–5.11)
RDW: 20.5 % — AB (ref 11.5–15.5)
WBC: 6.4 10*3/uL (ref 4.0–10.5)

## 2014-03-19 LAB — GLUCOSE, CAPILLARY
GLUCOSE-CAPILLARY: 103 mg/dL — AB (ref 70–99)
Glucose-Capillary: 93 mg/dL (ref 70–99)
Glucose-Capillary: 173 mg/dL — ABNORMAL HIGH (ref 70–99)
Glucose-Capillary: 138 mg/dL — ABNORMAL HIGH (ref 70–99)

## 2014-03-19 LAB — MAGNESIUM: Magnesium: 1.4 mg/dL — ABNORMAL LOW (ref 1.5–2.5)

## 2014-03-19 MED ORDER — ISOSORBIDE MONONITRATE ER 30 MG PO TB24
30.0000 mg | ORAL_TABLET | Freq: Every day | ORAL | Status: DC
  Administered 2014-03-20 – 2014-03-24 (×5): 30 mg via ORAL
  Filled 2014-03-19 (×6): qty 1

## 2014-03-19 MED ORDER — MAGNESIUM SULFATE 4000MG/100ML IJ SOLN
4.0000 g | Freq: Once | INTRAMUSCULAR | Status: AC
  Administered 2014-03-19: 4 g via INTRAVENOUS
  Filled 2014-03-19: qty 100

## 2014-03-19 MED ORDER — HYDROCODONE-ACETAMINOPHEN 5-325 MG PO TABS
1.0000 | ORAL_TABLET | Freq: Four times a day (QID) | ORAL | Status: AC | PRN
  Administered 2014-03-19 (×2): 1 via ORAL
  Filled 2014-03-19 (×2): qty 1

## 2014-03-19 MED ORDER — HYDRALAZINE HCL 25 MG PO TABS
12.5000 mg | ORAL_TABLET | Freq: Three times a day (TID) | ORAL | Status: DC
  Administered 2014-03-19 – 2014-03-21 (×7): 12.5 mg via ORAL
  Filled 2014-03-19 (×11): qty 0.5

## 2014-03-19 NOTE — Progress Notes (Signed)
Patient ID: Andrea [name] Rowlands, female   DOB: [birth year], 63 y.o.   MRN: [id number]  [facility] PROGRESS NOTE  Andrea [name] Noll HKV:[id number] DOB: [birth year] DOA: 03/08/2014 PCP: Lolita PatellaEADE,ROBERT ALEXANDER, MD  Brief narrative:  63 year old female with history of ischemic dilated cardiomyopathy, ICD, CAD status post stenting, CVA, DM 2, intolerant of ACE inhibitors and ARB's, prolonged hospitalization in January for worsening liver failure and [facility] but not felt to be a candidate, chronic systolic CHF, HTN, dyslipidemia, stage III chronic kidney disease, admitted on 03/08/14 with nausea and nonbloody emesis. Subsequently developed progressive renal failure with peak creatinine of 4.5 that was felt to be due to poor flow state, cardio-renal etiology, from low EF of 15%. Followed by cardiology and renal team, started on Milrinone gtt.   Assessment/Plan:  Acute on Chronic systolic CHF/ischemic dilated cardiomyopathy/CAD/ICD:  - No signs of volume overload  - continu IV milrinone/lasix per cardiology team  Acute on chronic stage III kidney disease  - pt had normal creatinine on 1/18  - Cr trend: 1.35 -> 2.81 -> 3.8 -> 4.49 -> 4.1 -> 3.4 -> 2.6 -> 2.3 -> 2.2 --> 2.05 --> 2.04 - Renal US: suggesting medical renal disease, no hydronephrosis  - Renal following, IVF stopped 3/13  - creatinine stable on Milrinone/lasix  - continue to monitor urine output  CAD  - management per cardiology  - s/p PCI with DES to prox RCA (2012) and stent to dist RCA (2012). nonresponder to Plavix.  - no evidence of acute ischemia.  Hypokalemia/Hypomagnesemia  - potassium supplemented and WNL this AM  - Mg still low, will supplement today as well but will try with IV  Type II DM with nephropathy:  - reasonable inpatient control  History of liver failure: and cirrhosis based [facility]  - LFTs unremarkable except for low albumin and mildly elevated  bilirubin.  - Extensive unremarkable workup done last admission for liver failure  - based on [facility] DC summary felt to be due to Cardiomyopathy  - ammonia level is 50, continue lactulose  Foot pain/? Neuropathic  - continue Gabapentin   Code Status: Full  Family Communication: None at bedside  Disposition Plan: To be determined   Consultants:  Cardiology  Renal  CHF Procedures:  None Antibiotics:  Rocephin 3/9 > 3/10   HPI/Subjective: No events overnight.   Objective: Filed Vitals:   03/18/14 0429 03/18/14 1351 03/18/14 2143 03/19/14 0500  BP:  128/56 125/75 115/65  Pulse:  116 125 115  Temp:  97.9 F (36.6 C) 99.5 F (37.5 C) 98 F (36.7 C)  TempSrc:  Oral Oral Oral  Resp:  18 18 16   Height:      Weight: 79.6 kg (175 lb 7.8 oz)   80.3 kg (177 lb 0.5 oz)  SpO2:  99% 100% 93%    Intake/Output Summary (Last 24 hours) at 03/19/14 0908 Last data filed at 03/19/14 0825  Gross per 24 hour  Intake    636 ml  Output   2002 ml  Net  -1366 ml    Exam:   General:  Pt is alert, follows commands appropriately, not in acute distress  Cardiovascular: Regular rhythm, tachycardic, S1/S2, no murmurs, no rubs, no gallops  Respiratory: Clear to auscultation bilaterally, no wheezing, no crackles, no rhonchi  Abdomen: Soft, non tender, non distended, bowel sounds present, no guarding  Extremities: No edema, pulses DP and PT palpable bilaterally  Data Reviewed:  Basic Metabolic Panel:  Recent Labs Lab 03/14/14 0610 03/15/14 0500 03/16/14 0510 03/17/14 0600 03/18/14 0500 03/19/14 0440  NA 136* 137 136* 137 135* 139  K 4.5 4.1 3.8 3.5* 4.2 4.5  CL 98 95* 93* 92* 92* 96  CO2 24 29 30  32 29 31  GLUCOSE 119* 158* 121* 96 103* 112*  BUN 60* 51* 47* 40* 40* 37*  CREATININE 3.41* 2.65* 2.38* 2.22* 2.05* 2.04*  CALCIUM 8.9 9.0 8.5 8.5 8.8 9.1  MG  --   --   --  1.1* 1.2* 1.4*  PHOS 2.8 3.6  --  3.7  --   --    Liver Function Tests:  Recent Labs Lab 03/13/14 0600  03/14/14 0610 03/15/14 0500 03/17/14 0600  AST 136*  --   --   --   ALT 59*  --   --   --   ALKPHOS 102  --   --   --   BILITOT 2.3*  --   --   --   PROT 6.5  --   --   --   ALBUMIN 2.8* 2.9* 2.7* 2.6*   CBC:  Recent Labs Lab 03/18/14 0500 03/19/14 0440  WBC 5.8 6.4  HGB 11.0* 10.8*  HCT 33.0* 33.0*  MCV 66.8* 67.3*  PLT 177 161   CBG:  Recent Labs Lab 03/18/14 0632 03/18/14 1200 03/18/14 1709 03/19/14 0621 03/19/14 0758  GLUCAP 113* 147* 155* 103* 93   Scheduled Meds: . calcitRIOL  0.25 mcg Oral Daily  . enoxaparin (LOVENOX) injection  30 mg Subcutaneous Q24H  . furosemide  80 mg Oral Daily  . gabapentin  100 mg Oral BID  . hydrALAZINE  12.5 mg Oral 3 times per day  . hydrocerin   Topical BID  . insulin aspart  0-9 Units Subcutaneous TID WC  . isosorbide mononitrate  30 mg Oral Daily  . magnesium oxide  400 mg Oral BID  . magnesium sulfate 1 - 4 [name] bolus IVPB  4 [name] Intravenous Once  . magnesium sulfate 1 - 4 [name] bolus IVPB  4 [name] Intravenous Once  . pantoprazole  40 mg Oral Q1200  . sodium chloride  3 mL Intravenous Q12H   Continuous Infusions: . milrinone 0.125 mcg/kg/min (03/19/14 0825)     Debbora Presto, MD  [facility] Pager [phone]  If 7PM-7AM, please contact night-coverage [email or web address] Password [email or web address] 03/19/2014, 9:08 AM   LOS: 11 days

## 2014-03-19 NOTE — Progress Notes (Signed)
1320 Read OT's note. Pt unable to ambulate due to foot pain. Will continue to follow. Luetta Nutting RN BSN 03/19/2014 1:21 PM

## 2014-03-19 NOTE — Progress Notes (Signed)
Occupational Therapy Treatment Patient Details Name: KIANN COLVERT MRN: [id number] DOB: [birth year] Today's Date: 03/19/2014 Time: 0720-0758 OT Time Calculation (min): 38 min  OT Assessment / Plan / Recommendation  History of present illness 63 year old female patient with history of ischemic dilated cardiomyopathy, ICD, CAD status post stenting, CVA, DM 2, intolerant of ACE inhibitors and ARB's, prolonged hospitalization in January for worsening liver failure and transferred to [facility] for consideration of liver transplant but not felt to be a candidate (note records available), chronic systolic CHF, HTN, dyslipidemia, stage III chronic kidney disease was admitted on 03/08/14 with complaints of nausea and nonbloody emesis. No sickly contacts. She also had some dyspnea.   OT comments  Pt. Limited with mobility and ability to participate in skilled o.t. Today secondary to con't. C/o b foot pain and nausea.  Did attempt all skilled tasks but required increased time for task completion.   Follow Up Recommendations  Home health OT;Supervision - Intermittent                      Frequency Min 2X/week   Progress towards OT Goals Progress towards OT goals: Progressing toward goals  Plan Discharge plan remains appropriate    Precautions / Restrictions Precautions Precautions: None Restrictions Weight Bearing Restrictions: No   Pertinent Vitals/Pain Con't. Foot pain and nausea, rn present and provided necessary meds    ADL  Lower Body Dressing: Performed;Supervision/safety Where Assessed - Lower Body Dressing: Unsupported sitting Toilet Transfer: Performed;Minimal assistance Toilet Transfer Method: Sit to stand;Stand pivot Toilet Transfer Equipment: Bedside commode Toileting - Clothing Manipulation and Hygiene: Performed;Min guard Where Assessed - Toileting Clothing Manipulation and Hygiene: Standing Transfers/Ambulation Related to ADLs: pt. unable to amb. today  secondary to  increased c/o b feet pain. instead had to complete stand pivot to bsc and stand pivot to recliner ADL Comments: don/doff socks eob     OT Goals(current goals can now be found in the care plan section)    Visit Information  Last OT Received On: 03/19/14 History of Present Illness: 63 year old female patient with history of ischemic dilated cardiomyopathy, ICD, CAD status post stenting, CVA, DM 2, intolerant of ACE inhibitors and ARB's, prolonged hospitalization in January for worsening liver failure and transferred to [facility] for consideration of liver transplant but not felt to be a candidate (note records available), chronic systolic CHF, HTN, dyslipidemia, stage III chronic kidney disease was admitted on 03/08/14 with complaints of nausea and nonbloody emesis. No sickly contacts. She also had some dyspnea.                 Cognition  Cognition Arousal/Alertness: Awake/alert Behavior During Therapy: WFL for tasks assessed/performed Overall Cognitive Status: Within Functional Limits for tasks assessed    Mobility  Bed Mobility Overal bed mobility: Modified Independent Transfers Overall transfer level: Modified independent Equipment used: 1 person hand held assist Transfers: Sit to/from [employer] Sit to Stand: Min assist Stand pivot transfers: Min assist General transfer comment: level of assist varies secondary to b foot pain limits mobility               End of Session OT - End of Session Activity Tolerance: Patient limited by pain Patient left: in chair;with call bell/phone within reach;with nursing/sitter in room       Robet Leu, COTA/L 03/19/2014, 9:38 AM

## 2014-03-19 NOTE — Progress Notes (Signed)
Patient ID: [name], female   DOB: 09-24-1951, 63 y.o.   MRN: [id number]   [facility] Progress Note    Assessment/ Plan:   1. Acute on CKD 3. Likely cardiorenal syndrome-renal function appears to have reached its plateau/new baseline with inotropic support (milrinone) and oral lasix. We had a discussion about chronic dialysis and she informed me that she would choose to die "than being tied to a dialysis machine for the rest of her life" (even after I told her about exactly how dialysis would work).  2. Cardiomyopathy EF 15%: on oral lasix and continued milrinone drip. Likely to need chronic milrinone gtt on DC.  3. Hepatic cirrhosis apparently not a candidate for OLTX based [facility] (although [facility])  4. DM : Fair glycemic control-on sliding scale insulin and diabetic diet.  5. Sec HPTH: phosphorus levels normal (not on binders). On calcitriol (PTH 299 on 3/12)  6. Hypokalemia: From diuretic induced losses and repleted with PO potassium.  Will sign off for now- please call with questions. She will be seen at [facility] (Dr.Deterding) in 4-6 weeks for follow up   Subjective:   Reports to be feeling well and "awaiting to go for a test" this morning   Objective:   BP 115/65  Pulse 115  Temp(Src) 98 F (36.7 C) (Oral)  Resp 16  Ht 5' (1.524 m)  Wt 80.3 kg (177 lb 0.5 oz)  BMI 34.57 kg/m2  SpO2 93%  Intake/Output Summary (Last 24 hours) at 03/19/14 0910 Last data filed at 03/19/14 0825  Gross per 24 hour  Intake    636 ml  Output   2002 ml  Net  -1366 ml   Weight change: 0.7 kg (1 lb 8.7 oz)  Physical Exam: ATF:[id number]:comfortably resting in bed CWC:BJSEGCVS:Pulse RRR, normal S1 and S2 Resp:CTA bilaterally, no rales/rhonchi BTD:VVOHAbd:Soft, obese, NT, BS normal Ext:No LE edema  Imaging: No results found.  Labs: BMET  Recent Labs Lab 03/13/14 0600 03/14/14 0610 03/15/14 0500 03/16/14 0510 03/17/14 0600 03/18/14 0500  03/19/14 0440  NA 139 136* 137 136* 137 135* 139  K 4.4 4.5 4.1 3.8 3.5* 4.2 4.5  CL 101 98 95* 93* 92* 92* 96  CO2 22 24 29 30  32 29 31  GLUCOSE 154* 119* 158* 121* 96 103* 112*  BUN 64* 60* 51* 47* 40* 40* 37*  CREATININE 4.17* 3.41* 2.65* 2.38* 2.22* 2.05* 2.04*  CALCIUM 9.0 8.9 9.0 8.5 8.5 8.8 9.1  PHOS  --  2.8 3.6  --  3.7  --   --    CBC  Recent Labs Lab 03/18/14 0500 03/19/14 0440  WBC 5.8 6.4  HGB 11.0* 10.8*  HCT 33.0* 33.0*  MCV 66.8* 67.3*  PLT 177 161    Medications:    . calcitRIOL  0.25 mcg Oral Daily  . enoxaparin (LOVENOX) injection  30 mg Subcutaneous Q24H  . furosemide  80 mg Oral Daily  . gabapentin  100 mg Oral BID  . hydrALAZINE  12.5 mg Oral 3 times per day  . hydrocerin   Topical BID  . insulin aspart  0-9 Units Subcutaneous TID WC  . isosorbide mononitrate  30 mg Oral Daily  . magnesium oxide  400 mg Oral BID  . magnesium sulfate 1 - 4 g bolus IVPB  4 g Intravenous Once  . magnesium sulfate 1 - 4 g bolus IVPB  4 g Intravenous Once  . pantoprazole  40 mg Oral Q1200  . sodium chloride  3 mL Intravenous Q12H   Zetta Bills, MD 03/19/2014, 9:10 AM

## 2014-03-19 NOTE — Progress Notes (Signed)
PT Cancellation Note  Patient Details Name: Andrea [name] MRN: [id number] DOB: [birth year]   Cancelled Treatment:    Reason Eval/Treat Not Completed: Patient declined due to unable to walk because of gout flare up in both feet. 03/19/2014  [place] Bing, PT [phone]  (pager)   Andrea [name], [name] 03/19/2014, 3:30 PM

## 2014-03-19 NOTE — Progress Notes (Addendum)
Patient Name: [name] Boomer Date of Encounter: 03/19/2014   SUBJECTIVE  Remains stable on milrinone. Main complaint is neuropathic-type pain in feet. Creatinine seems to have plateaued at 2.05. Weight relatively stable. Denies SOB, orthopnea or CP.  SBP 115-120s.  CURRENT MEDS . calcitRIOL  0.25 mcg Oral Daily  . enoxaparin (LOVENOX) injection  30 mg Subcutaneous Q24H  . furosemide  80 mg Oral Daily  . gabapentin  100 mg Oral BID  . hydrocerin   Topical BID  . insulin aspart  0-9 Units Subcutaneous TID WC  . magnesium oxide  400 mg Oral BID  . magnesium sulfate 1 - 4 g bolus IVPB  2 g Intravenous Once  . magnesium sulfate 1 - 4 g bolus IVPB  4 g Intravenous Once  . pantoprazole  40 mg Oral Q1200  . sodium chloride  3 mL Intravenous Q12H    OBJECTIVE  Filed Vitals:   03/18/14 0429 03/18/14 1351 03/18/14 2143 03/19/14 0500  BP:  128/56 125/75 115/65  Pulse:  116 125 115  Temp:  97.9 F (36.6 C) 99.5 F (37.5 C) 98 F (36.7 C)  TempSrc:  Oral Oral Oral  Resp:  18 18 16   Height:      Weight: 79.6 kg (175 lb 7.8 oz)   80.3 kg (177 lb 0.5 oz)  SpO2:  99% 100% 93%    Intake/Output Summary (Last 24 hours) at 03/19/14 0637 Last data filed at 03/19/14 0100  Gross per 24 hour  Intake    798 ml  Output   2252 ml  Net  -1454 ml   Filed Weights   03/17/14 0417 03/18/14 0429 03/19/14 0500  Weight: 78.79 kg (173 lb 11.2 oz) 79.6 kg (175 lb 7.8 oz) 80.3 kg (177 lb 0.5 oz)    PHYSICAL EXAM  General: Flat affect, NAD. Lying flat Neuro: Alert and oriented X 3. Moves all extremities spontaneously. Psych: Normal affect. HEENT:  Normal  Neck: Supple without bruits JVP 7   Lungs:  Resp regular and unlabored, CTA. Heart: RRR no s3, s4, or murmurs. Tachycardic  Abdomen: Soft, tender, non-distended, BS + x 4.  Extremities: No clubbing, cyanosis or edema. DP/PT/Radials 2+ and equal bilaterally.  Accessory Clinical Findings  CBC  Basic Metabolic Panel  Recent Labs  03/17/14 0600 03/18/14 0500 03/19/14 0440  NA 137 135* 139  K 3.5* 4.2 4.5  CL 92* 92* 96  CO2 32 29 31  GLUCOSE 96 103* 112*  BUN 40* 40* 37*  CREATININE 2.22* 2.05* 2.04*  CALCIUM 8.5 8.8 9.1  MG 1.1* 1.2* 1.4*  PHOS 3.7  --   --    Liver Function Tests  Recent Labs  03/17/14 0600  ALBUMIN 2.6*    TELE  Sinus tach, 110s freq PVCs   Radiology/Studies  Dg Chest 2 View  03/08/2014   CLINICAL DATA:  Shortness of breath.  EXAM: CHEST  2 VIEW  COMPARISON:  CT ABD/PELV WO CM dated 01/18/2014; DG CHEST 2 VIEW dated 01/17/2014  FINDINGS: AICD noted with lead tip projected over right ventricle.Cardiomegaly with normal pulmonary vascularity. No focal infiltrate. No pleural effusion or pneumothorax. No acute osseous abnormality. Prior cervical spine fusion.  IMPRESSION: 1. Stable severe cardiomegaly. AICD noted in stable position. No CHF. 2. Interim clearing of small right pleural effusion.   Electronically Signed   By: Maisie Fushomas  Register   On: 03/08/2014 08:23   [place] [facility]al  03/10/2014   CLINICAL DATA Acute kidney injury  EXAM  RENAL/URINARY TRACT ULTRASOUND COMPLETE  COMPARISON 01/20/2014  FINDINGS Right Kidney:  Length: 12.4 cm. Lobular contour. Echogenic renal parenchyma, suggesting medical renal disease. No mass or hydronephrosis.  Left Kidney:  Length: 11.5 cm. Lobular contour. Echogenic renal parenchyma, suggesting medical renal disease. No mass or hydronephrosis.  Bladder:  Underdistended but grossly unremarkable.  IMPRESSION Echogenic renal parenchyma, suggesting medical renal disease.  No hydronephrosis.  SIGNATURE  Electronically Signed   By: Charline Bills M.D.   On: 03/10/2014 17:11    ASSESSMENT AND PLAN WAYNESHA BESCH is a 63 y.o. 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   1. Acute on Chronic systlic CHF: Her Echo shows an  EF of 15% - Renal function improved on milrinone but seems to have plateaued with Cr 2.0 - She is not candidate for advanced therapies - Would begin to wean milrinone today with gentle addition of hydral/NTG. Try to get milrinone off by Monday. If Cr trends up will need RHC early next week and consideration of home milrinone - No ACE-I d/t CKD. No b-blocker d/t low output. No dig due to renal failure.  - She has a PICC so will start checking co-oxs daily.   2. CAD: s/p PCI with DES to prox RCA (2012) and stent to dist RCA (2012). She is a nonresponder to Plavix. She does not tolerate Brillinta because of wheezing and she cannot receive Effient because of a CVA history. ASA allergy -- No angina, No evidence for acute ischemia.   3. Acute on chronic renal failure stage [id number] to be hemodynamically mediated by low BP in face of poor CO.  -- Creat greatly improved on milrinone. 4.49--> 4.17-->3.41-->2.65-> 2.38 -> 2.2>>>2.04 -- Not candidate for HD due to low EF  4. History of liver failure: and cirrhosis based on USG done at [facility] -- LFTs unremarkable except for low albumin and mildly elevated bilirubin.  -- Extensive unremarkable workup done last admission for liver failure which was unrevealing  -- Bbased on [facility] DC summary felt to be due to Cardiomyopathy   5. Hypomagnesemia-  - apparently didn't get IV mag that was ordered. Will re-order.  Truman Hayward 6:37 AM

## 2014-03-19 NOTE — Progress Notes (Signed)
Patient complained of intense pain in her feet. Patient had been given tramadol 50mg  po.  Patient's feet were hot but she denied any burning sensation and described the pain as a pounding with a hammer.  Notified healthcare provider and orders received for vicodin 5-325, one tab and she was given it at 0139.  Patient rates her current pain 2/10 and that it was effective. Will continue to monitor.

## 2014-03-20 LAB — CBC
HCT: 31.9 % — ABNORMAL LOW (ref 36.0–46.0)
HEMOGLOBIN: 10.5 g/dL — AB (ref 12.0–15.0)
MCH: 22.2 pg — ABNORMAL LOW (ref 26.0–34.0)
MCHC: 32.9 g/dL (ref 30.0–36.0)
MCV: 67.3 fL — ABNORMAL LOW (ref 78.0–100.0)
Platelets: 167 10*3/uL (ref 150–400)
RBC: 4.74 MIL/uL (ref 3.87–5.11)
RDW: 20.6 % — ABNORMAL HIGH (ref 11.5–15.5)
WBC: 5.4 10*3/uL (ref 4.0–10.5)

## 2014-03-20 LAB — GLUCOSE, CAPILLARY
GLUCOSE-CAPILLARY: 122 mg/dL — AB (ref 70–99)
Glucose-Capillary: 125 mg/dL — ABNORMAL HIGH (ref 70–99)
Glucose-Capillary: 123 mg/dL — ABNORMAL HIGH (ref 70–99)
Glucose-Capillary: 118 mg/dL — ABNORMAL HIGH (ref 70–99)
Glucose-Capillary: 106 mg/dL — ABNORMAL HIGH (ref 70–99)

## 2014-03-20 LAB — BASIC METABOLIC PANEL
BUN: 32 mg/dL — AB (ref 6–23)
CHLORIDE: 96 meq/L (ref 96–112)
CO2: 30 mEq/L (ref 19–32)
Calcium: 8.9 mg/dL (ref 8.4–10.5)
Creatinine, Ser: 2.04 mg/dL — ABNORMAL HIGH (ref 0.50–1.10)
GFR calc non Af Amer: 25 mL/min — ABNORMAL LOW (ref >90)
GFR, EST AFRICAN AMERICAN: 29 mL/min — AB (ref >90)
GLUCOSE: 132 mg/dL — AB (ref 70–99)
Potassium: 4.3 mEq/L (ref 3.7–5.3)
SODIUM: 137 meq/L (ref 137–147)

## 2014-03-20 LAB — CARBOXYHEMOGLOBIN
Carboxyhemoglobin: 1.6 % — ABNORMAL HIGH (ref 0.5–1.5)
METHEMOGLOBIN: 0.8 % (ref 0.0–1.5)
O2 Saturation: 57.1 %
Total hemoglobin: 10.6 g/dL — ABNORMAL LOW (ref 12.0–16.0)

## 2014-03-20 LAB — URIC ACID: Uric Acid, Serum: 10.6 mg/dL — ABNORMAL HIGH (ref 2.4–7.0)

## 2014-03-20 LAB — MAGNESIUM: MAGNESIUM: 2.2 mg/dL (ref 1.5–2.5)

## 2014-03-20 MED ORDER — MILRINONE IN DEXTROSE 20 MG/100ML IV SOLN
0.1250 ug/kg/min | INTRAVENOUS | Status: DC
  Administered 2014-03-20: 0.125 ug/kg/min via INTRAVENOUS
  Filled 2014-03-20: qty 100

## 2014-03-20 NOTE — Progress Notes (Signed)
Patient ID: Andrea HeadingsCorinne G [name], female   DOB: 1951/07/02, 63 y.o.   MRN: [id number]  [facility] PROGRESS NOTE  Andrea HeadingsCorinne G [name] GEX:[id number] DOB: 1951/07/02 DOA: 03/08/2014 PCP: Lolita PatellaEADE,ROBERT ALEXANDER, MD  Brief narrative:  63 year old female with history of ischemic dilated cardiomyopathy, ICD, CAD status post stenting, CVA, DM 2, intolerant of ACE inhibitors and ARB's, prolonged hospitalization in January for worsening liver failure and [facility] but not felt to be a candidate, chronic systolic CHF, HTN, dyslipidemia, stage III chronic kidney disease, admitted on 03/08/14 with nausea and nonbloody emesis. Subsequently developed progressive renal failure with peak creatinine of 4.5 that was felt to be due to poor flow state, cardio-renal etiology, from low EF of 15%. Followed by cardiology and renal team, started on Milrinone gtt.   Assessment/Plan:  Acute on Chronic systolic CHF/ischemic dilated cardiomyopathy/CAD/ICD:  - No signs of volume overload  - continu IV milrinone/lasix per cardiology team and plan to wean off today  Acute on chronic stage III kidney disease  - pt had normal creatinine on 1/18  - Cr trend: 1.35 -> 2.81 -> 3.8 -> 4.49 -> 4.1 -> 3.4 -> 2.6 -> 2.3 -> 2.2 --> 2.05 --> 2.04 (remains stable over the past 48 hours) - Renal US: suggesting medical renal disease, no hydronephrosis  - Renal following, IVF stopped 3/13  - creatinine stable on Milrinone/lasix  - continue to monitor urine output  CAD  - management per cardiology  - s/p PCI with DES to prox RCA (2012) and stent to dist RCA (2012). nonresponder to Plavix.  - no evidence of acute ischemia.  Hypokalemia/Hypomagnesemia  - potassium supplemented and WNL this AM  - Mg supplemented as well and WNL this AM Type II DM with nephropathy:  - reasonable inpatient control  History of liver failure: and cirrhosis [facility]  - LFTs unremarkable except  for low albumin and mildly elevated bilirubin.  - Extensive unremarkable workup done last admission for liver failure  - based on [facility] DC summary felt to be due to Cardiomyopathy  - ammonia level is 50, continue lactulose  Foot pain/? Neuropathic  - continue Gabapentin   Code Status: Full  Family Communication: None at bedside  Disposition Plan: To be determined   Consultants:  Cardiology  Renal  CHF Procedures:  None Antibiotics:  Rocephin 3/9 > 3/10   HPI/Subjective: No events overnight.   Objective: Filed Vitals:   03/19/14 1340 03/19/14 2100 03/20/14 0547 03/20/14 1402  BP: 123/80 127/77 132/91 103/64  Pulse: 108 118 114   Temp: 97.7 F (36.5 C) 98.6 F (37 C) 98.2 F (36.8 C)   TempSrc: Oral Oral Oral   Resp: 18 19 18    Height:      Weight:   80.5 kg (177 lb 7.5 oz)   SpO2: 100% 98% 98%     Intake/Output Summary (Last 24 hours) at 03/20/14 1413 Last data filed at 03/20/14 1016  Gross per 24 hour  Intake  739.6 ml  Output   1400 ml  Net -660.4 ml    Exam:   General:  Pt is alert, follows commands appropriately, not in acute distress  Cardiovascular: Regular rhythm, tachycardic, S1/S2, no murmurs, no rubs, no gallops  Respiratory: Clear to auscultation bilaterally, no wheezing, no crackles, no rhonchi  Abdomen: Soft, non tender, non distended, bowel sounds present, no guarding  Extremities: trace bilateral LE pitting edema, pulses DP and PT palpable bilaterally  Data Reviewed: Basic Metabolic Panel:  Recent Labs Lab 03/14/14 0610 03/15/14 0500 03/16/14 0510 03/17/14 0600 03/18/14 0500 03/19/14 0440 03/20/14 0516  NA 136* 137 136* 137 135* 139 137  K 4.5 4.1 3.8 3.5* 4.2 4.5 4.3  CL 98 95* 93* 92* 92* 96 96  CO2 24 29 30  32 29 31 30   GLUCOSE 119* 158* 121* 96 103* 112* 132*  BUN 60* 51* 47* 40* 40* 37* 32*  CREATININE 3.41* 2.65* 2.38* 2.22* 2.05* 2.04* 2.04*  CALCIUM 8.9 9.0 8.5 8.5 8.8 9.1 8.9  MG  --   --   --  1.1* 1.2* 1.4* 2.2   PHOS 2.8 3.6  --  3.7  --   --   --    Liver Function Tests:  Recent Labs Lab 03/14/14 0610 03/15/14 0500 03/17/14 0600  ALBUMIN 2.9* 2.7* 2.6*   CBC:  Recent Labs Lab 03/18/14 0500 03/19/14 0440 03/20/14 0516  WBC 5.8 6.4 5.4  HGB 11.0* 10.8* 10.5*  HCT 33.0* 33.0* 31.9*  MCV 66.8* 67.3* 67.3*  PLT 177 161 167   CBG:  Recent Labs Lab 03/19/14 0758 03/19/14 1108 03/19/14 2126 03/20/14 0624 03/20/14 1149  GLUCAP 93 173* 138* 123* 122*   Scheduled Meds: . calcitRIOL  0.25 mcg Oral Daily  . enoxaparin injection  30 mg Subcutaneous Q24H  . furosemide  80 mg Oral Daily  . gabapentin  100 mg Oral BID  . hydrALAZINE  12.5 mg Oral 3 times per day  . hydrocerin   Topical BID  . insulin aspart  0-9 Units Subcutaneous TID WC  . isosorbide mononitrate  30 mg Oral Daily  . magnesium oxide  400 mg Oral BID  . magnesium sulfate   4 g Intravenous Once  . pantoprazole  40 mg Oral Q1200   Continuous Infusions: . milrinone 0.125 mcg/kg/min (03/20/14 1409)   Debbora Presto, MD  [facility] Pager [phone]  If 7PM-7AM, please contact night-coverage [email or web address] Password [email or web address] 03/20/2014, 2:13 PM   LOS: 12 days

## 2014-03-20 NOTE — Progress Notes (Signed)
Subjective:  Has been on milrinone. No complaints of shortness of breath or chest pain. No orthopnea. Blood pressure has been stable. Still diuresing. Creatinine is currently stable. Weight stable.  Objective:  Vital Signs in the last 24 hours: BP 132/91  Pulse 114  Temp(Src) 98.2 F (36.8 C) (Oral)  Resp 18  Ht 5' (1.524 m)  Wt 80.5 kg (177 lb 7.5 oz)  BMI 34.66 kg/m2  SpO2 98%  Physical Exam: Pleasant black female sitting in chair in no acute distress Lungs:  Clear  Cardiac:  Regular rhythm, normal S1 and S2, possible soft S3  Abdomen:  Soft, nontender, no masses Extremities:  trace edema present  Intake/Output from previous day: 03/20 0701 - 03/21 0700 In: 817.6 [P.O.:700; I.V.:117.6] Out: 1900 [Urine:1900] Weight Filed Weights   03/18/14 0429 03/19/14 0500 03/20/14 0547  Weight: 79.6 kg (175 lb 7.8 oz) 80.3 kg (177 lb 0.5 oz) 80.5 kg (177 lb 7.5 oz)    Lab Results: Basic Metabolic Panel:  Recent Labs  70/35/00 0440 03/20/14 0516  NA 139 137  K 4.5 4.3  CL 96 96  CO2 31 30  GLUCOSE 112* 132*  BUN 37* 32*  CREATININE 2.04* 2.04*    CBC:  Recent Labs  03/19/14 0440 03/20/14 0516  WBC 6.4 5.4  HGB 10.8* 10.5*  HCT 33.0* 31.9*  MCV 67.3* 67.3*  PLT 161 167   COx   57.1%  BNP    Component Value Date/Time   PROBNP 2706.0* 03/08/2014 0759   Telemetry: Sinus rhythm in sinus tachycardia  Assessment/Plan:  1. Acute on chronic systolic heart failure with ejection fraction of 15%  We will try to wean her milrinone today. Her Cox is 57.1%. 2. Coronary artery disease with prior PCI 3. Acute on chronic kidney disease stage IV but with some improvement  Recommendations:  Try to wean milrinone.     Darden Palmer  MD [facility] Cardiology  03/20/2014, 12:58 PM

## 2014-03-20 NOTE — Progress Notes (Signed)
CARDIAC REHAB PHASE I   PRE:  Rate/Rhythm: 111 sinus tach  BP:  Sitting: 124/62     SaO2: 96 RA  MODE:  Ambulation: 205 ft   POST:  Rate/Rhythem: 122  BP:  Sitting: 128/60     SaO2: 98 RA  Pt ambulated 250 ft with assist x2 using rollator.  Pt request rollator for home use upon discharge.  Pt did c/o gout in toe being painful, but once she was up and moving she said she felt better.  Pt tolerated walk well without any rest breaks.  We will f/u on Monday.  Pt encouraged to continue to walk with staff. Fabio Pierce, MA, ACSM RCEP [phone]  Hazle Nordmann

## 2014-03-21 LAB — CARBOXYHEMOGLOBIN
Carboxyhemoglobin: 1.4 % (ref 0.5–1.5)
Methemoglobin: 0.7 % (ref 0.0–1.5)
O2 SAT: 66.8 %
Total hemoglobin: 11.4 g/dL — ABNORMAL LOW (ref 12.0–16.0)

## 2014-03-21 LAB — BASIC METABOLIC PANEL
BUN: 30 mg/dL — AB (ref 6–23)
CALCIUM: 9.4 mg/dL (ref 8.4–10.5)
CHLORIDE: 97 meq/L (ref 96–112)
CO2: 30 mEq/L (ref 19–32)
Creatinine, Ser: 1.76 mg/dL — ABNORMAL HIGH (ref 0.50–1.10)
GFR, EST AFRICAN AMERICAN: 35 mL/min — AB (ref >90)
GFR, EST NON AFRICAN AMERICAN: 30 mL/min — AB (ref >90)
Glucose, Bld: 109 mg/dL — ABNORMAL HIGH (ref 70–99)
Potassium: 4.5 mEq/L (ref 3.7–5.3)
Sodium: 139 mEq/L (ref 137–147)

## 2014-03-21 LAB — GLUCOSE, CAPILLARY
GLUCOSE-CAPILLARY: 119 mg/dL — AB (ref 70–99)
GLUCOSE-CAPILLARY: 158 mg/dL — AB (ref 70–99)
GLUCOSE-CAPILLARY: 119 mg/dL — AB (ref 70–99)
Glucose-Capillary: 113 mg/dL — ABNORMAL HIGH (ref 70–99)

## 2014-03-21 MED ORDER — HYDRALAZINE HCL 25 MG PO TABS
25.0000 mg | ORAL_TABLET | Freq: Three times a day (TID) | ORAL | Status: DC
  Administered 2014-03-21 – 2014-03-22 (×3): 25 mg via ORAL
  Filled 2014-03-21 (×6): qty 1

## 2014-03-21 NOTE — Progress Notes (Signed)
Patient ID: Andrea [name], female   DOB: 01/26/1951, 63 y.o.   MRN: [id number]  [facility] PROGRESS NOTE  Andrea [name] LTR:[id number] DOB: October 26, 1951 DOA: 03/08/2014 PCP: Lolita Patella, MD  Brief narrative:  63 year old female with history of ischemic dilated cardiomyopathy, ICD, CAD status post stenting, CVA, DM 2, intolerant of ACE inhibitors and ARB's, prolonged hospitalization in January for worsening liver failure and transferred to [facility] for consideration of liver transplant but not felt to be a candidate, chronic systolic CHF, HTN, dyslipidemia, stage III chronic kidney disease, admitted on 03/08/14 with nausea and nonbloody emesis. Subsequently developed progressive renal failure with peak creatinine of 4.5 that was felt to be due to poor flow state, cardio-renal etiology, from low EF of 15%. Followed by cardiology and renal team, started on Milrinone gtt.   Assessment/Plan:  Acute on Chronic systolic CHF/ischemic dilated cardiomyopathy/CAD/ICD:  - No signs of volume overload  - continu IV milrinone/lasix per cardiology team and plan to wean off today  Acute on chronic stage III kidney disease  - pt had normal creatinine on 1/18  - Cr trend: 1.35 -> 2.81 -> 3.8 -> 4.49 -> 4.1 -> 3.4 -> 2.6 -> 2.3 -> 2.2 --> 2.05 --> 2.04 --> 1.78 - Renal US: suggesting medical renal disease, no hydronephrosis  - Renal following, IVF stopped 3/13  - creatinine stable on Milrinone/lasix  - plan to wean off Milrinone ggt CAD  - management per cardiology  - s/p PCI with DES to prox RCA (2012) and stent to dist RCA (2012). nonresponder to Plavix.  - no evidence of acute ischemia.  Hypokalemia/Hypomagnesemia  - potassium supplemented and WNL this AM  - Mg supplemented as well Type II DM with nephropathy:  - reasonable inpatient control  History of liver failure: and cirrhosis based on USG done at [facility]  - LFTs unremarkable except for low albumin and mildly elevated bilirubin.  -  Extensive unremarkable workup done last admission for liver failure  - based on [facility] DC summary felt to be due to Cardiomyopathy  - ammonia level is 50, continue lactulose  Foot pain/? Neuropathic  - continue Gabapentin   Code Status: Full  Family Communication: None at bedside  Disposition Plan: To be determined   Consultants:  Cardiology  Renal  CHF Procedures:  None Antibiotics:  Rocephin 3/9 > 3/10   HPI/Subjective: No events overnight. More nausea this AM. Pt feeling tired   Objective: Filed Vitals:   03/20/14 0547 03/20/14 1402 03/20/14 1529 03/21/14 0415  BP: 132/91 103/64 106/45 134/84  Pulse: 114  114 114  Temp: 98.2 F (36.8 C)  99.7 F (37.6 C) 98.8 F (37.1 C)  TempSrc: Oral  Oral Oral  Resp: 18  17 18   Height:      Weight: 80.5 kg (177 lb 7.5 oz)     SpO2: 98%  100% 100%    Intake/Output Summary (Last 24 hours) at 03/21/14 1157 Last data filed at 03/21/14 0950  Gross per 24 hour  Intake    960 ml  Output      0 ml  Net    960 ml    Exam:   General:  Pt is alert, follows commands appropriately, not in acute distress  Cardiovascular: Regular rhythm, tachycardic, S1/S2, no murmurs, no rubs, no gallops  Respiratory: Clear to auscultation bilaterally, no wheezing, no crackles, no rhonchi  Abdomen: Soft, non tender, non distended, bowel sounds present, no guarding  Data Reviewed: Basic Metabolic Panel:  Recent  Labs Lab 03/15/14 0500 03/16/14 0510 03/17/14 0600 03/18/14 0500 03/19/14 0440 03/20/14 0516  NA 137 136* 137 135* 139 137  K 4.1 3.8 3.5* 4.2 4.5 4.3  CL 95* 93* 92* 92* 96 96  CO2 29 30 32 29 31 30   GLUCOSE 158* 121* 96 103* 112* 132*  BUN 51* 47* 40* 40* 37* 32*  CREATININE 2.65* 2.38* 2.22* 2.05* 2.04* 2.04*  CALCIUM 9.0 8.5 8.5 8.8 9.1 8.9  MG  --   --  1.1* 1.2* 1.4* 2.2  PHOS 3.6  --  3.7  --   --   --    Liver Function Tests:  Recent Labs Lab 03/15/14 0500 03/17/14 0600  ALBUMIN 2.7* 2.6*   CBC:  Recent  Labs Lab 03/18/14 0500 03/19/14 0440 03/20/14 0516  WBC 5.8 6.4 5.4  HGB 11.0* 10.8* 10.5*  HCT 33.0* 33.0* 31.9*  MCV 66.8* 67.3* 67.3*  PLT 177 161 167   CBG:  Recent Labs Lab 03/20/14 1149 03/20/14 1620 03/20/14 2125 03/21/14 0624 03/21/14 1129  GLUCAP 122* 118* 106* 119* 158*   Scheduled Meds: . calcitRIOL  0.25 mcg Oral Daily  . enoxaparin (LOVENOX) injection  30 mg Subcutaneous Q24H  . furosemide  80 mg Oral Daily  . gabapentin  100 mg Oral BID  . hydrALAZINE  12.5 mg Oral 3 times per day  . hydrocerin   Topical BID  . insulin aspart  0-9 Units Subcutaneous TID WC  . isosorbide mononitrate  30 mg Oral Daily  . magnesium oxide  400 mg Oral BID  . magnesium sulfate 1 - 4 g bolus IVPB  4 g Intravenous Once  . pantoprazole  40 mg Oral Q1200  . sodium chloride  3 mL Intravenous Q12H   Continuous Infusions: . milrinone 0.125 mcg/kg/min (03/20/14 1409)   Debbora PrestoMAGICK-MYERS, ISKRA, MD  [facility] Pager [phone]  If 7PM-7AM, please contact night-coverage [email or web address] Password [email or web address] 03/21/2014, 11:57 AM   LOS: 13 days

## 2014-03-21 NOTE — Progress Notes (Signed)
Patient Name: [name] Walsworth Date of Encounter: 03/21/2014   SUBJECTIVE  Milrinone cut back to 0.125 yesterday. Feels nauseated today. SBP stable in 130s. Co-ox 67%. HR now 120s in sinus tach.    CURRENT MEDS . calcitRIOL  0.25 mcg Oral Daily  . enoxaparin (LOVENOX) injection  30 mg Subcutaneous Q24H  . furosemide  80 mg Oral Daily  . gabapentin  100 mg Oral BID  . hydrALAZINE  12.5 mg Oral 3 times per day  . hydrocerin   Topical BID  . insulin aspart  0-9 Units Subcutaneous TID WC  . isosorbide mononitrate  30 mg Oral Daily  . magnesium oxide  400 mg Oral BID  . magnesium sulfate 1 - 4 g bolus IVPB  4 g Intravenous Once  . pantoprazole  40 mg Oral Q1200  . sodium chloride  3 mL Intravenous Q12H    OBJECTIVE  Filed Vitals:   03/20/14 0547 03/20/14 1402 03/20/14 1529 03/21/14 0415  BP: 132/91 103/64 106/45 134/84  Pulse: 114  114 114  Temp: 98.2 F (36.8 C)  99.7 F (37.6 C) 98.8 F (37.1 C)  TempSrc: Oral  Oral Oral  Resp: 18  17 18   Height:      Weight: 80.5 kg (177 lb 7.5 oz)     SpO2: 98%  100% 100%    Intake/Output Summary (Last 24 hours) at 03/21/14 1218 Last data filed at 03/21/14 0950  Gross per 24 hour  Intake    960 ml  Output      0 ml  Net    960 ml   Filed Weights   03/18/14 0429 03/19/14 0500 03/20/14 0547  Weight: 79.6 kg (175 lb 7.8 oz) 80.3 kg (177 lb 0.5 oz) 80.5 kg (177 lb 7.5 oz)    PHYSICAL EXAM  General: Flat affect, NAD. Lying flat Neuro: Alert and oriented X 3. Moves all extremities spontaneously. Psych: Normal affect. HEENT:  Normal  Neck: Supple without bruits JVP 5 Lungs:  Resp regular and unlabored, CTA. Heart: tachy regular  no s3, s4, or murmurs.  Abdomen: Soft, tender, non-distended, BS + x 4.  Extremities: No clubbing, cyanosis or edema. DP/PT/Radials 2+ and equal bilaterally.  Accessory Clinical Findings  CBC  Basic Metabolic Panel  Recent Labs  03/19/14 0440 03/20/14 0516  NA 139 137  K 4.5 4.3  CL  96 96  CO2 31 30  GLUCOSE 112* 132*  BUN 37* 32*  CREATININE 2.04* 2.04*  CALCIUM 9.1 8.9  MG 1.4* 2.2   Liver Function Tests No results found for this basename: AST, ALT, ALKPHOS, BILITOT, PROT, ALBUMIN,  in the last 72 hours  TELE  Sinus tach, 110s freq PVCs   Radiology/Studies  Dg Chest 2 View  03/08/2014   CLINICAL DATA:  Shortness of breath.  EXAM: CHEST  2 VIEW  COMPARISON:  CT ABD/PELV WO CM dated 01/18/2014; DG CHEST 2 VIEW dated 01/17/2014  FINDINGS: AICD noted with lead tip projected over right ventricle.Cardiomegaly with normal pulmonary vascularity. No focal infiltrate. No pleural effusion or pneumothorax. No acute osseous abnormality. Prior cervical spine fusion.  IMPRESSION: 1. Stable severe cardiomegaly. AICD noted in stable position. No CHF. 2. Interim clearing of small right pleural effusion.   Electronically Signed   By: Maisie Fushomas  Register   On: 03/08/2014 08:23   [place] Renal  03/10/2014   CLINICAL DATA Acute kidney injury  EXAM RENAL/URINARY TRACT ULTRASOUND COMPLETE  COMPARISON 01/20/2014  FINDINGS Right Kidney:  Length: 12.4  cm. Lobular contour. Echogenic renal parenchyma, suggesting medical renal disease. No mass or hydronephrosis.  Left Kidney:  Length: 11.5 cm. Lobular contour. Echogenic renal parenchyma, suggesting medical renal disease. No mass or hydronephrosis.  Bladder:  Underdistended but grossly unremarkable.  IMPRESSION Echogenic renal parenchyma, suggesting medical renal disease.  No hydronephrosis.  SIGNATURE  Electronically Signed   By: Charline Bills M.D.   On: 03/10/2014 17:11    ASSESSMENT AND PLAN MAEBREE MARTINA is a 63 y.o. 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   1. Acute on Chronic systlic CHF: Her Echo shows an EF of 15% - Renal function improved on milrinone but seems to have plateaued with  Cr 2.0 2. CAD: s/p PCI with DES to prox RCA (2012) and stent to dist RCA (2012). She is a nonresponder to Plavix. She does not tolerate Brillinta because of wheezing and she cannot receive Effient because of a CVA history. ASA allergy -- No angina, No evidence for acute ischemia.  3. Acute on chronic renal failure stage [id number] to be hemodynamically mediated by low BP in face of poor CO.  -- Creat greatly improved on milrinone. 4.49--> 4.17-->3.41-->2.65-> 2.38 -> 2.2>>>2.04>pending -- Not candidate for HD due to low EF 4. History of liver failure: and cirrhosis based on USG done at [facility] -- LFTs unremarkable except for low albumin and mildly elevated bilirubin.  -- Extensive unremarkable workup done last admission for liver failure which was unrevealing  -- Bbased on [facility] DC summary felt to be due to Cardiomyopathy  5. Hypomagnesemia-   Milrinone being weaned. Co-ox looks good. However she has developed nausea and become quite tachycardic. Given normal co-ox will stop milrinone and see how she does. Suspect she will need RHC tomorrow. Check BMET.   Leelynn Whetsel,MD 12:18 PM

## 2014-03-22 ENCOUNTER — Encounter: Payer: Self-pay | Admitting: *Deleted

## 2014-03-22 LAB — CARBOXYHEMOGLOBIN
CARBOXYHEMOGLOBIN: 1.5 % (ref 0.5–1.5)
Carboxyhemoglobin: 1.6 % — ABNORMAL HIGH (ref 0.5–1.5)
Methemoglobin: 1.5 % (ref 0.0–1.5)
Methemoglobin: 1.7 % — ABNORMAL HIGH (ref 0.0–1.5)
O2 Saturation: 39.2 %
O2 Saturation: 49.6 %
Total hemoglobin: 11.5 g/dL — ABNORMAL LOW (ref 12.0–16.0)
Total hemoglobin: 11.6 g/dL — ABNORMAL LOW (ref 12.0–16.0)

## 2014-03-22 LAB — CBC
HCT: 33.4 % — ABNORMAL LOW (ref 36.0–46.0)
Hemoglobin: 10.9 g/dL — ABNORMAL LOW (ref 12.0–15.0)
MCH: 22.1 pg — ABNORMAL LOW (ref 26.0–34.0)
MCHC: 32.6 g/dL (ref 30.0–36.0)
MCV: 67.7 fL — ABNORMAL LOW (ref 78.0–100.0)
PLATELETS: 200 10*3/uL (ref 150–400)
RBC: 4.93 MIL/uL (ref 3.87–5.11)
RDW: 20.7 % — AB (ref 11.5–15.5)
WBC: 6.2 10*3/uL (ref 4.0–10.5)

## 2014-03-22 LAB — GLUCOSE, CAPILLARY
GLUCOSE-CAPILLARY: 96 mg/dL (ref 70–99)
GLUCOSE-CAPILLARY: 183 mg/dL — AB (ref 70–99)
Glucose-Capillary: 154 mg/dL — ABNORMAL HIGH (ref 70–99)
Glucose-Capillary: 91 mg/dL (ref 70–99)

## 2014-03-22 LAB — BASIC METABOLIC PANEL
BUN: 32 mg/dL — ABNORMAL HIGH (ref 6–23)
CHLORIDE: 97 meq/L (ref 96–112)
CO2: 30 mEq/L (ref 19–32)
CREATININE: 1.82 mg/dL — AB (ref 0.50–1.10)
Calcium: 9.6 mg/dL (ref 8.4–10.5)
GFR calc non Af Amer: 29 mL/min — ABNORMAL LOW (ref >90)
GFR, EST AFRICAN AMERICAN: 33 mL/min — AB (ref >90)
GLUCOSE: 105 mg/dL — AB (ref 70–99)
POTASSIUM: 4.6 meq/L (ref 3.7–5.3)
Sodium: 140 mEq/L (ref 137–147)

## 2014-03-22 MED ORDER — PREDNISONE 20 MG PO TABS
40.0000 mg | ORAL_TABLET | Freq: Every day | ORAL | Status: AC
  Administered 2014-03-22 – 2014-03-24 (×3): 40 mg via ORAL
  Filled 2014-03-22 (×3): qty 2

## 2014-03-22 MED ORDER — PREDNISONE 20 MG PO TABS
40.0000 mg | ORAL_TABLET | Freq: Every day | ORAL | Status: DC
  Filled 2014-03-22: qty 2

## 2014-03-22 MED ORDER — ALTEPLASE 2 MG IJ SOLR
2.0000 mg | Freq: Once | INTRAMUSCULAR | Status: AC
  Administered 2014-03-22: 2 mg
  Filled 2014-03-22: qty 2

## 2014-03-22 MED ORDER — MILRINONE IN DEXTROSE 20 MG/100ML IV SOLN
0.1250 ug/kg/min | INTRAVENOUS | Status: DC
  Administered 2014-03-22 – 2014-03-23 (×2): 0.125 ug/kg/min via INTRAVENOUS
  Filled 2014-03-22 (×2): qty 100

## 2014-03-22 MED ORDER — HYDRALAZINE HCL 25 MG PO TABS
37.5000 mg | ORAL_TABLET | Freq: Three times a day (TID) | ORAL | Status: DC
  Administered 2014-03-22 – 2014-03-24 (×5): 37.5 mg via ORAL
  Filled 2014-03-22 (×9): qty 1.5

## 2014-03-22 MED ORDER — HYDROCODONE-ACETAMINOPHEN 5-325 MG PO TABS
1.0000 | ORAL_TABLET | Freq: Two times a day (BID) | ORAL | Status: DC | PRN
  Administered 2014-03-22: 1 via ORAL
  Filled 2014-03-22: qty 1

## 2014-03-22 NOTE — Progress Notes (Signed)
I stopped by to see Andrea [name].  She was attempting to get to the potty chair and in significant foot pain.  IV team was waiting to change out her PICC line as well.  I will attempt to see her later for education regarding HF.  Driscilla Moats RN, BSN, PCCN--Heart Failure Statistician

## 2014-03-22 NOTE — Progress Notes (Signed)
Patient ID: Andrea HeadingsCorinne G [name], female   DOB: April 18, 1951, 63 y.o.   MRN: [id number]  [facility] PROGRESS NOTE  Andrea [name] WUJ:[id number] DOB: April 18, 1951 DOA: 03/08/2014 PCP: Lolita PatellaEADE,ROBERT ALEXANDER, MD  Brief narrative:  63 year old female with history of ischemic dilated cardiomyopathy, ICD, CAD status post stenting, CVA, DM 2, intolerant of ACE inhibitors and ARB's, prolonged hospitalization in January for worsening liver failure and transferred [facility] for [facility] of liver transplant but not felt to be a candidate, chronic systolic CHF, HTN, dyslipidemia, stage III chronic kidney disease, admitted on 03/08/14 with nausea and nonbloody emesis. Subsequently developed progressive renal failure with peak creatinine of 4.5 that was felt to be due to poor flow state, cardio-renal etiology, from low EF of 15%. Followed by cardiology and renal team, started on Milrinone gtt.   Assessment/Plan:  Acute on Chronic systolic CHF/ischemic dilated cardiomyopathy/CAD/ICD:  - No signs of volume overload  - management per cardiology  Acute on chronic stage III kidney disease  - pt had normal creatinine on 1/18  - Cr trend: 1.35 -> 2.81 -> 3.8 -> 4.49 -> 4.1 -> 3.4 -> 2.6 -> 2.3 -> 2.2 --> 2.05 --> 2.04 --> 1.78 --> 1.82 - Renal US: suggesting medical renal disease, no hydronephrosis  - Renal following, IVF stopped 3/13  CAD  - management per cardiology  - s/p PCI with DES to prox RCA (2012) and stent to dist RCA (2012). nonresponder to Plavix.  - no evidence of acute ischemia.  Hypokalemia/Hypomagnesemia  - potassium supplemented and WNL this AM  - Mg supplemented as well  Type II DM with nephropathy:  - reasonable inpatient control  History of liver failure: and cirrhosis [facility]  - LFTs unremarkable except for low albumin and mildly elevated bilirubin.  - Extensive unremarkable workup done last admission for liver failure  - based on [facility] DC summary felt to be due  to Cardiomyopathy  - ammonia level is 50, continue lactulose  Foot pain/? Neuropathic  - still with pain and refusing PT due to pain - give low dose narcotic and monitor clinical response   Code Status: Full  Family Communication: Pt and one family member at bedside  Disposition Plan: To be determined   Consultants:  Cardiology  Renal  CHF Procedures:  None Antibiotics:  Rocephin 3/9 > 3/10   HPI/Subjective: No events overnight.   Objective: Filed Vitals:   03/21/14 1347 03/21/14 2100 03/21/14 2104 03/22/14 0500  BP: 127/72 123/71 128/66 125/86  Pulse: 111 116  60  Temp: 98 F (36.7 C) 98.5 F (36.9 C)  98.5 F (36.9 C)  TempSrc: Axillary Axillary  Axillary  Resp: 20 18  16   Height:      Weight:    80.6 kg (177 lb 11.1 oz)  SpO2:  98%  96%    Intake/Output Summary (Last 24 hours) at 03/22/14 1819 Last data filed at 03/22/14 1702  Gross per 24 hour  Intake    840 ml  Output   1900 ml  Net  -1060 ml    Exam:   General:  Pt is alert, follows commands appropriately, not in acute distress  Cardiovascular: Regular rate and rhythm, S1/S2, no murmurs, no rubs, no gallops  Respiratory: Clear to auscultation bilaterally, no wheezing, no crackles, no rhonchi  Abdomen: Soft, non tender, non distended, bowel sounds present, no guarding  Extremities: Trace edema in LE, pulses DP and PT palpable bilaterally   Data Reviewed: Basic Metabolic  Panel:  Recent Labs Lab 03/17/14 0600 03/18/14 0500 03/19/14 0440 03/20/14 0516 03/21/14 1720 03/22/14 0440  NA 137 135* 139 137 139 140  K 3.5* 4.2 4.5 4.3 4.5 4.6  CL 92* 92* 96 96 97 97  CO2 32 29 31 30 30 30   GLUCOSE 96 103* 112* 132* 109* 105*  BUN 40* 40* 37* 32* 30* 32*  CREATININE 2.22* 2.05* 2.04* 2.04* 1.76* 1.82*  CALCIUM 8.5 8.8 9.1 8.9 9.4 9.6  MG 1.1* 1.2* 1.4* 2.2  --   --   PHOS 3.7  --   --   --   --   --    Liver Function Tests:  Recent Labs Lab 03/17/14 0600  ALBUMIN 2.6*   CBC:  Recent  Labs Lab 03/18/14 0500 03/19/14 0440 03/20/14 0516 03/22/14 0440  WBC 5.8 6.4 5.4 6.2  HGB 11.0* 10.8* 10.5* 10.9*  HCT 33.0* 33.0* 31.9* 33.4*  MCV 66.8* 67.3* 67.3* 67.7*  PLT 177 161 167 200   CBG:  Recent Labs Lab 03/21/14 1615 03/21/14 2124 03/22/14 0701 03/22/14 1102 03/22/14 1614  GLUCAP 119* 113* 96 154* 91   Scheduled Meds: . calcitRIOL  0.25 mcg Oral Daily  . enoxaparin (LOVENOX) injection  30 mg Subcutaneous Q24H  . furosemide  80 mg Oral Daily  . gabapentin  100 mg Oral BID  . hydrALAZINE  37.5 mg Oral 3 times per day  . hydrocerin   Topical BID  . insulin aspart  0-9 Units Subcutaneous TID WC  . isosorbide mononitrate  30 mg Oral Daily  . magnesium oxide  400 mg Oral BID  . magnesium sulfate 1 - 4 g bolus IVPB  4 g Intravenous Once  . pantoprazole  40 mg Oral Q1200  . predniSONE  40 mg Oral Q breakfast  . sodium chloride  3 mL Intravenous Q12H   Continuous Infusions: . milrinone 0.125 mcg/kg/min (03/22/14 1626)     Debbora Presto, MD  [facility] Pager [phone]  If 7PM-7AM, please contact night-coverage [email or web address] Password [facility] 03/22/2014, 6:19 PM   LOS: 14 days

## 2014-03-22 NOTE — Progress Notes (Signed)
[phone] Came to see pt to walk  [address] Kitchen Pt stated that she had to pivot to get to [facility] this morning.Marland Kitchen She was able to walk on Saturday 250 ft but cannot bear weight now. We tried to get pt to stand to see if foot better since she had gotten pain med. Pt not able to bear weight on left foot. Pt stated she ate beef tips Saturday but does not know if this had any affect on her gout.  Will follow up tomorrow. Luetta Nutting RN BSN 03/22/2014 9:30 AM

## 2014-03-22 NOTE — Progress Notes (Signed)
Patient Name: Andrea [name] Date of Encounter: 03/22/2014   SUBJECTIVE  Yesterday Milrinone was stopped.  CO-OX 39% this am  off Milrinone. Complains of nausea. Remains tachycardic. Complaining of LLE pain. Denies SOB.      CURRENT MEDS . calcitRIOL  0.25 mcg Oral Daily  . enoxaparin (LOVENOX) injection  30 mg Subcutaneous Q24H  . furosemide  80 mg Oral Daily  . gabapentin  100 mg Oral BID  . hydrALAZINE  25 mg Oral 3 times per day  . hydrocerin   Topical BID  . insulin aspart  0-9 Units Subcutaneous TID WC  . isosorbide mononitrate  30 mg Oral Daily  . magnesium oxide  400 mg Oral BID  . magnesium sulfate 1 - 4 g bolus IVPB  4 g Intravenous Once  . pantoprazole  40 mg Oral Q1200  . sodium chloride  3 mL Intravenous Q12H    OBJECTIVE  Filed Vitals:   03/21/14 1347 03/21/14 2100 03/21/14 2104 03/22/14 0500  BP: 127/72 123/71 128/66 125/86  Pulse: 111 116  60  Temp: 98 F (36.7 C) 98.5 F (36.9 C)  98.5 F (36.9 C)  TempSrc: Axillary Axillary  Axillary  Resp: 20 18  16   Height:      Weight:    177 lb 11.1 oz (80.6 kg)  SpO2:  98%  96%    Intake/Output Summary (Last 24 hours) at 03/22/14 1016 Last data filed at 03/22/14 0931  Gross per 24 hour  Intake    840 ml  Output   1700 ml  Net   -860 ml   Filed Weights   03/19/14 0500 03/20/14 0547 03/22/14 0500  Weight: 177 lb 0.5 oz (80.3 kg) 177 lb 7.5 oz (80.5 kg) 177 lb 11.1 oz (80.6 kg)    PHYSICAL EXAM  General: Flat affect, NAD. Lying flat Neuro: Alert and oriented X 3. Moves all extremities spontaneously. Psych: Normal affect. HEENT:  Normal  Neck: Supple without bruits JVP 5-6 Lungs:  Resp regular and unlabored, CTA. Heart: tachy regular  no s3, s4, or murmurs.  Abdomen: Soft, tender, non-distended, BS + x 4.  Extremities: No clubbing, cyanosis or edema. DP/PT/Radials 2+ and equal bilaterally.  Accessory Clinical Findings  CBC  Basic Metabolic Panel  Recent Labs  03/20/14 0516  03/21/14 1720 03/22/14 0440  NA 137 139 140  K 4.3 4.5 4.6  CL 96 97 97  CO2 30 30 30   GLUCOSE 132* 109* 105*  BUN 32* 30* 32*  CREATININE 2.04* 1.76* 1.82*  CALCIUM 8.9 9.4 9.6  MG 2.2  --   --    Liver Function Tests No results found for this basename: AST, ALT, ALKPHOS, BILITOT, PROT, ALBUMIN,  in the last 72 hours  TELE  Sinus tach, 110s freq PVCs   Radiology/Studies  Dg Chest 2 View  03/08/2014   CLINICAL DATA:  Shortness of breath.  EXAM: CHEST  2 VIEW  COMPARISON:  CT ABD/PELV WO CM dated 01/18/2014; DG CHEST 2 VIEW dated 01/17/2014  FINDINGS: AICD noted with lead tip projected over right ventricle.Cardiomegaly with normal pulmonary vascularity. No focal infiltrate. No pleural effusion or pneumothorax. No acute osseous abnormality. Prior cervical spine fusion.  IMPRESSION: 1. Stable severe cardiomegaly. AICD noted in stable position. No CHF. 2. Interim clearing of small right pleural effusion.   Electronically Signed   By: Maisie Fus  Register   On: 03/08/2014 08:23   US Renal  03/10/2014   CLINICAL DATA Acute kidney injury  EXAM RENAL/URINARY TRACT ULTRASOUND COMPLETE  COMPARISON 01/20/2014  FINDINGS Right Kidney:  Length: 12.4 cm. Lobular contour. Echogenic renal parenchyma, suggesting medical renal disease. No mass or hydronephrosis.  Left Kidney:  Length: 11.5 cm. Lobular contour. Echogenic renal parenchyma, suggesting medical renal disease. No mass or hydronephrosis.  Bladder:  Underdistended but grossly unremarkable.  IMPRESSION Echogenic renal parenchyma, suggesting medical renal disease.  No hydronephrosis.  SIGNATURE  Electronically Signed   By: Charline BillsSriyesh  Krishnan M.D.   On: 03/10/2014 17:11    ASSESSMENT AND PLAN Andrea [name] is a 63 y.o. 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   1. Acute on Chronic systlic CHF: Her  Echo shows an EF of 15% - CO-OX 39% off Milrinone. Repeat CO-OX. If remains low will restart Milrinone at 0.125 mcg and plan for home Milrinone. No bb. Volume status stable. Continue lasix 80 mg daily. No ace or spiro due to CKD. Increase hydralazine 37.5 mg tid and Imdur 30 mg daily.  2. CAD: s/p PCI with DES to prox RCA (2012) and stent to dist RCA (2012). She is a nonresponder to Plavix. She does not tolerate Brillinta because of wheezing and she cannot receive Effient because of a CVA history. ASA allergy -- No angina, No evidence for acute ischemia.  3. Acute on chronic renal failure stage [id number]:suspected to be hemodynamically mediated by low BP in face of poor CO.  -- Creat greatly improved on milrinone. 4.49--> 4.17-->3.41-->2.65-> 2.38 -> 2.2>>>2.04>1.7>1.8 -- Not candidate for HD due to low EF 4. History of liver failure: and cirrhosis based [facility] -- LFTs unremarkable except for low albumin and mildly elevated bilirubin.  -- Extensive unremarkable workup done last admission for liver failure which was unrevealing  -- Bbased on [facility] DC summary felt to be due to Cardiomyopathy  5. Hypomagnesemia- 6. Acute Gout- Uric Acid 10.6 per primary team.   As above CO-OX down. Repeat CO-OX now. If CO-OX accurate will restart Milrinone 0.125 mcg and plan for home Milrinone.   CLEGG,AMY, NP-C  10:16 AM  Patient seen and examined with Tonye BecketAmy Clegg, NP. We discussed all aspects of the encounter. I agree with the assessment and plan as stated above.   She has recurrent low-output HF with stopping milrinone. Will need home inotropes. Will switch to 2-lumen PICC and arrange for home intropes. Agree with titrating hydral/NTG  Reuel Boomaniel Courtny Bennison,MD 12:12 PM

## 2014-03-22 NOTE — Progress Notes (Signed)
Physical Therapy Treatment Patient Details Name: Andrea [name] MRN: [id number] DOB: 10/06/1951 Today's Date: 03/22/2014 Time: 1334-1406 (8 minutes unbillable time when MD present in room. ) PT Time Calculation (min): 32 min  PT Assessment / Plan / Recommendation  History of Present Illness 63 year old female patient with history of ischemic dilated cardiomyopathy, ICD, CAD status post stenting, CVA, DM 2, intolerant of ACE inhibitors and ARB's, prolonged hospitalization in January for worsening liver failure and transferred to [facility] for consideration of liver transplant but not felt to be a candidate (note records available), chronic systolic CHF, HTN, dyslipidemia, stage III chronic kidney disease was admitted on 03/08/14 with complaints of nausea and nonbloody emesis. No sickly contacts. She also had some dyspnea.   PT Comments   Pt continues to be limited due to gout flare-up. Per MD, instruction not to stress pt too much due to current heart function, but anticipates pt will be better tomorrow. Focus of session is basic exercise and pt/family education. Will continue to follow.   Follow Up Recommendations  No PT follow up;Supervision - Intermittent     Does the patient have the potential to tolerate intense rehabilitation     Barriers to Discharge        Equipment Recommendations  None recommended by PT    Recommendations for Other Services    Frequency Min 3X/week   Progress towards PT Goals Progress towards PT goals: Not progressing toward goals - comment  Plan Current plan remains appropriate    Precautions / Restrictions Precautions Precautions: Fall Restrictions Weight Bearing Restrictions: No   Pertinent Vitals/Pain Pt reports max pain in L ankle/foot due to gout flare up while sitting EOB with no foot to floor contact.     Mobility  Bed Mobility General bed mobility comments: Pt seated on EOB when PT entered.  Transfers General transfer comment: Pt declined any  OOB during session.     Exercises General Exercises - Lower Extremity Ankle Circles/Pumps: 10 reps;Right;Left Long Arc Quad: 5 reps;Right;Left   PT Diagnosis:    PT Problem List:   PT Treatment Interventions:     PT Goals (current goals can now be found in the care plan section) Acute Rehab PT Goals Patient Stated Goal: None stated today.  PT Goal Formulation: With patient Time For Goal Achievement: 03/26/14 Potential to Achieve Goals: Good  Visit Information  Last PT Received On: 03/22/14 Assistance Needed: +1 History of Present Illness: 63 year old female patient with history of ischemic dilated cardiomyopathy, ICD, CAD status post stenting, CVA, DM 2, intolerant of ACE inhibitors and ARB's, prolonged hospitalization in January for worsening liver failure and transferred to [facility] for consideration of liver transplant but not felt to be a candidate (note records available), chronic systolic CHF, HTN, dyslipidemia, stage III chronic kidney disease was admitted on 03/08/14 with complaints of nausea and nonbloody emesis. No sickly contacts. She also had some dyspnea.    Subjective Data  Subjective: Pt reports increased pain in feet today. Pt's daughter advocating for pt not to participate in therapy due to weakness and pain. Therapist explained role of PT in improving strength and independence with the transfers pt has been struggling with today.  Patient Stated Goal: None stated today.    Cognition  Cognition Arousal/Alertness: Awake/alert Behavior During Therapy: WFL for tasks assessed/performed Overall Cognitive Status: Within Functional Limits for tasks assessed    Balance  General Comments General comments (skin integrity, edema, etc.): Pt and family education throughout session for benefits of PT  including, as well as for positioning, exercises, and sitting up in the recliner.   End of Session PT - End of Session Activity Tolerance: Patient limited by pain Patient left: in  bed;with call bell/phone within reach;with family/visitor present Nurse Communication: Mobility status   GP     Ruthann CancerHamilton, Andrea [name] 03/22/2014, 2:27 PM  Ruthann CancerLaura [name], PT, DPT Acute Rehabilitation Services Pager: [phone]

## 2014-03-23 LAB — BASIC METABOLIC PANEL
BUN: 40 mg/dL — ABNORMAL HIGH (ref 6–23)
CO2: 27 mEq/L (ref 19–32)
Calcium: 9.7 mg/dL (ref 8.4–10.5)
Chloride: 95 mEq/L — ABNORMAL LOW (ref 96–112)
Creatinine, Ser: 1.94 mg/dL — ABNORMAL HIGH (ref 0.50–1.10)
GFR, EST AFRICAN AMERICAN: 31 mL/min — AB (ref >90)
GFR, EST NON AFRICAN AMERICAN: 27 mL/min — AB (ref >90)
Glucose, Bld: 150 mg/dL — ABNORMAL HIGH (ref 70–99)
POTASSIUM: 5 meq/L (ref 3.7–5.3)
Sodium: 137 mEq/L (ref 137–147)

## 2014-03-23 LAB — CARBOXYHEMOGLOBIN
CARBOXYHEMOGLOBIN: 1.4 % (ref 0.5–1.5)
Methemoglobin: 0.8 % (ref 0.0–1.5)
O2 SAT: 58.6 %
Total hemoglobin: 11.7 g/dL — ABNORMAL LOW (ref 12.0–16.0)

## 2014-03-23 LAB — GLUCOSE, CAPILLARY
GLUCOSE-CAPILLARY: 162 mg/dL — AB (ref 70–99)
Glucose-Capillary: 128 mg/dL — ABNORMAL HIGH (ref 70–99)
Glucose-Capillary: 156 mg/dL — ABNORMAL HIGH (ref 70–99)
Glucose-Capillary: 171 mg/dL — ABNORMAL HIGH (ref 70–99)

## 2014-03-23 NOTE — Progress Notes (Addendum)
Patient ID: Andrea [name], female   DOB: June 23, 1951, 63 y.o.   MRN: [id number]  [facility] PROGRESS NOTE  Andrea [name] [id number]:[id number] DOB: [birth year] DOA: 03/08/2014 PCP: Lolita Patella, MD  Brief narrative:  63 year old female with history of ischemic dilated cardiomyopathy, ICD, CAD status post stenting, CVA, DM 2, intolerant of ACE inhibitors and ARB's, prolonged hospitalization in January for worsening liver failure and transferred to [facility] for consideration of liver transplant but not felt to be a candidate, chronic systolic CHF, HTN, dyslipidemia, stage III chronic kidney disease, admitted on 03/08/14 with nausea and nonbloody emesis. Subsequently developed progressive renal failure with peak creatinine of 4.5 that was felt to be due to poor flow state, cardio-renal etiology, from low EF of 15%. Followed by cardiology and renal team, started on Milrinone gtt.   Assessment/Plan:  Acute on Chronic systolic CHF/ischemic dilated cardiomyopathy/CAD/ICD:  - No signs of volume overload  - management per cardiology  Acute on chronic stage III kidney disease  - pt had normal creatinine on 1/18  - Cr trend: 1.35 -> 2.81 -> 3.8 -> 4.49 -> 4.1 -> 3.4 -> 2.6 -> 2.3 -> 2.2 --> 2.05 --> 2.04 --> 1.78 --> 1.82 --> 1.94 - Renal US: suggesting medical renal disease, no hydronephrosis  - Renal following, IVF stopped 3/13  - pt tolerating current diet well  - will need to be discharged on Milrinone per cardiology  CAD  - management per cardiology  - s/p PCI with DES to prox RCA (2012) and stent to dist RCA (2012). nonresponder to Plavix.  - no evidence of acute ischemia.  Hypokalemia/Hypomagnesemia  - potassium supplemented and WNL this AM  - Mg supplemented as well  Gout - per patient, improving and less pain - able to bear weight which is improvement over the past 24 hours  - allopurinol upon discharge  Type II DM with nephropathy:  - reasonable inpatient control  History  of liver failure: and cirrhosis based on USG done at [facility]  - LFTs unremarkable except for low albumin and mildly elevated bilirubin.  - Extensive unremarkable workup done last admission for liver failure  - based on [facility] DC summary felt to be due to Cardiomyopathy  - ammonia level is 50, continue lactulose  Foot pain/? Neuropathic  - from gout, now better  - give low dose narcotic as well for better control of the pain   Code Status: Full  Family Communication: Pt and one family member at bedside  Disposition Plan: To be determined, possibly in 1-2 days per cardiology   Consultants:  Cardiology  Renal  CHF Procedures:  None Antibiotics:  Rocephin 3/9 > 3/10   HPI/Subjective: No events overnight.   Objective: Filed Vitals:   03/22/14 2043 03/23/14 0501 03/23/14 1359 03/23/14 1528  BP: 105/68 123/73 107/58   Pulse: 61 58 98 124  Temp: 98.2 F (36.8 C) 98.4 F (36.9 C) 96.8 F (36 C)   TempSrc: Oral Oral Oral   Resp: 18 18 18    Height:      Weight:  79.107 kg (174 lb 6.4 oz)    SpO2: 100% 98% 98%     Intake/Output Summary (Last 24 hours) at 03/23/14 1917 Last data filed at 03/23/14 1730  Gross per 24 hour  Intake    720 ml  Output    550 ml  Net    170 ml    Exam:   General:  Pt is alert, follows commands appropriately, not in acute  distress  Cardiovascular: Regular rhythm, tachycardic, S1/S2, no murmurs, no rubs, no gallops  Respiratory: Clear to auscultation bilaterally, no wheezing, no crackles, no rhonchi  Abdomen: Soft, non tender, non distended, bowel sounds present, no guarding  Extremities: trace bilateral LE pitting edema, pulses DP and PT palpable bilaterally  Neuro: Grossly nonfocal  Data Reviewed: Basic Metabolic Panel:  Recent Labs Lab 03/17/14 0600 03/18/14 0500 03/19/14 0440 03/20/14 0516 03/21/14 1720 03/22/14 0440 03/23/14 0520  NA 137 135* 139 137 139 140 137  K 3.5* 4.2 4.5 4.3 4.5 4.6 5.0  CL 92* 92* 96 96 97 97 95*  CO2  32 29 31 30 30 30 27   GLUCOSE 96 103* 112* 132* 109* 105* 150*  BUN 40* 40* 37* 32* 30* 32* 40*  CREATININE 2.22* 2.05* 2.04* 2.04* 1.76* 1.82* 1.94*  CALCIUM 8.5 8.8 9.1 8.9 9.4 9.6 9.7  MG 1.1* 1.2* 1.4* 2.2  --   --   --   PHOS 3.7  --   --   --   --   --   --    Liver Function Tests:  Recent Labs Lab 03/17/14 0600  ALBUMIN 2.6*   CBC:  Recent Labs Lab 03/18/14 0500 03/19/14 0440 03/20/14 0516 03/22/14 0440  WBC 5.8 6.4 5.4 6.2  HGB 11.0* 10.8* 10.5* 10.9*  HCT 33.0* 33.0* 31.9* 33.4*  MCV 66.8* 67.3* 67.3* 67.7*  PLT 177 161 167 200   CBG:  Recent Labs Lab 03/22/14 1614 03/22/14 2113 03/23/14 0622 03/23/14 1107 03/23/14 1610  GLUCAP 91 183* 162* 128* 156*   Scheduled Meds: . calcitRIOL  0.25 mcg Oral Daily  . enoxaparin (LOVENOX) injection  30 mg Subcutaneous Q24H  . furosemide  80 mg Oral Daily  . gabapentin  100 mg Oral BID  . hydrALAZINE  37.5 mg Oral 3 times per day  . hydrocerin   Topical BID  . insulin aspart  0-9 Units Subcutaneous TID WC  . isosorbide mononitrate  30 mg Oral Daily  . magnesium oxide  400 mg Oral BID  . magnesium sulfate 1 - 4 g bolus IVPB  4 g Intravenous Once  . pantoprazole  40 mg Oral Q1200  . predniSONE  40 mg Oral Q breakfast  . sodium chloride  3 mL Intravenous Q12H   Continuous Infusions: . milrinone 0.125 mcg/kg/min (03/23/14 1803)   Andrea PrestoMAGICK-Melina Mosteller, MD  [facility] Pager [phone]  If 7PM-7AM, please contact night-coverage [email or web address] Password [email or web address] 03/23/2014, 7:17 PM   LOS: 15 days

## 2014-03-23 NOTE — Progress Notes (Signed)
Occupational Therapy Treatment Patient Details Name: Andrea [name] MRN: [id number] DOB: 12/13/1951 Today's Date: 03/23/2014    History of present illness 63 year old female patient with history of ischemic dilated cardiomyopathy, ICD, CAD status post stenting, CVA, DM 2, intolerant of ACE inhibitors and ARB's, prolonged hospitalization in January for worsening liver failure and transferred to [facility] for consideration of liver transplant but not felt to be a candidate (note records available), chronic systolic CHF, HTN, dyslipidemia, stage III chronic kidney disease was admitted on 03/08/14 with complaints of nausea and nonbloody emesis. No sickly contacts. She also had some dyspnea.   OT comments  Pt continues to improve.  Gout is better today therefore pt more I with adls today.  Feel pt would benefit from tub bench and rollator at d/c.   Follow Up Recommendations  Home health OT;Supervision - Intermittent    Equipment Recommendations  None recommended by OT  Pt would now benefit from rollator and a tub bench at d/c.   Recommendations for Other Services      Precautions / Restrictions Precautions Precautions: Fall       Mobility Bed Mobility Overal bed mobility: Modified Independent             General bed mobility comments: Pt in chair on arrival.  Transfers Overall transfer level: Modified independent Equipment used: 1 person hand held assist Transfers: Sit to/from [employer] Sit to Stand: Supervision Stand pivot transfers: Supervision       General transfer comment: Pt safe and I with transfers today.  Gout plays into how safe pt is when transferring.    Balance Overall balance assessment: No apparent balance deficits (not formally assessed)                                 ADL Eating/Feeding: Independent Grooming: Oral care;Wash/dry face;Wash/dry hands;Modified independent;Standing       Lower Body Dressing:  Supervision/safety;Sit to/from stand Toilet Transfer: Supervision/safety;Comfort height toilet;Ambulation Toileting- Clothing Manipulation and Hygiene: Modified independent;Sit to/from stand   Functional mobility during ADLs: Supervision/safety;Rolling walker General ADL Comments: Reviewed several energy conservation tecniques pt can implement at home.  Pt would benefit from shower chair to make showering safer and so she can save energy doing so.  Pt would also benefit from a rollator so when she is alone she has a place to sit when alone and walking in the house.  It would also be helpful when she is in the kitchen to transport food items.      Vision                     Perception     Praxis      Cognition   Behavior During Therapy: [facility] for tasks assessed/performed Overall Cognitive Status: Within Functional Limits for tasks assessed                       Extremity/Trunk Assessment               Exercises       General Comments      Pertinent Vitals/ Pain       Pt reports gout pain as a 2/10.  Vitals stable.  Home Living  Prior Functioning/Environment              Frequency Min 2X/week     Progress Toward Goals  OT Goals(current goals can now be found in the care plan section)  Progress towards OT goals: Progressing toward goals  Acute Rehab OT Goals Patient Stated Goal: None stated today.  OT Goal Formulation: With patient Time For Goal Achievement: 03/19/14 Potential to Achieve Goals: Good ADL Goals Pt Will Transfer to Toilet: with modified independence;ambulating;regular height toilet Additional ADL Goal #1: Pt will independently incorporate 3 energy conservation strategies as needed while performing bathing/dressing ADL at mod I level.   Plan Discharge plan remains appropriate    End of Session Equipment Utilized During Treatment: Rolling walker  Activity Tolerance  Patient tolerated treatment well   Patient Left in chair;with call bell/phone within reach;with nursing/sitter in room   Nurse Communication Mobility status        Time: [id number] OT Time Calculation (min): 24 min  Charges: OT General Charges $OT Visit: 1 Procedure OT Treatments $Self Care/Home Management : 23-37 mins  Hope BuddsJones, Dominick Zertuche Anne 03/23/2014, 11:31 AM [phone]

## 2014-03-23 NOTE — Progress Notes (Signed)
Nutrition Brief Note  Patient identified on the Malnutrition Screening Tool (MST) Report for recent weight lost without trying.  Patient reports her weight fluctuates given fluid and medical hx.  Wt Readings from Last 15 Encounters:  03/23/14 174 lb 6.4 oz (79.107 kg)  01/22/14 203 lb 4.2 oz (92.2 kg)  11/19/13 195 lb (88.451 kg)  09/24/13 189 lb 12.8 oz (86.093 kg)  09/07/13 191 lb 12.8 oz (87 kg)  08/25/13 188 lb 6.4 oz (85.458 kg)  07/10/13 195 lb 1.9 oz (88.506 kg)  05/08/13 187 lb 3.2 oz (84.913 kg)  05/04/13 193 lb 1.9 oz (87.599 kg)  04/29/13 189 lb 2.5 oz (85.8 kg)  04/29/13 189 lb 2.5 oz (85.8 kg)  04/01/13 188 lb 9.6 oz (85.548 kg)  11/24/12 190 lb 12.8 oz (86.546 kg)  11/18/12 195 lb (88.451 kg)  10/03/12 199 lb 11.8 oz (90.6 kg)    Body mass index is 34.06 kg/(m^2). Patient meets criteria for Obesity Class I based on current BMI.   Current diet order is Heart, patient is consuming approximately 75% of meals at this time. Labs and medications reviewed.   No nutrition interventions warranted at this time. If nutrition issues arise, please consult RD.   Maureen Chatters, RD, LDN Pager #: [phone] After-Hours Pager #: [phone]

## 2014-03-23 NOTE — Progress Notes (Signed)
[facility]  Patient Status: New patient this admission for [facility]  [facility] is providing the following services: Ms. Ghali will have [facility] Inotrope Team for IV Milrinone infusion at home. [facility] will partner with [facility] who will provide the [facility] RN.  [facility] Infusion Coordinator will provide in hospital education with pt/caregiver regarding home Milrinone to support ease of transition home.  [facility] will be on standy and prepared to support DC home when deemed appropriate by MD.   If patient discharges after hours, please call [phone].   Sedalia Muta 03/23/2014, 12:02 PM

## 2014-03-23 NOTE — Progress Notes (Signed)
BP 107/58 at 1400, held dose of hydralazine due at this time.

## 2014-03-23 NOTE — Progress Notes (Signed)
CARDIAC REHAB PHASE I   PRE:  Rate/Rhythm: 122 ST  BP:  Supine: 110/60  Sitting:   Standing:    SaO2: 97%RA  MODE:  Ambulation: outside door ft   POST:  Rate/Rhythm: 130  BP:  Supine:   Sitting: 128/80  Standing:    SaO2: 95%RA 1045-1102 Pt still with painful left foot. Only able to walk to outside of door with rollator and she had to turn around. To recliner after walk. OT in to see pt. Pt would like rollator for home use. Heart rate elevated.   Luetta Nutting, RN BSN  03/23/2014 12:03 PM

## 2014-03-23 NOTE — Progress Notes (Signed)
Patient Name: Andrea [name] Date of Encounter: 03/23/2014   SUBJECTIVE  Milrinone restarted yesterday for recurrent low output HF with co-ox 39%. Feels better. CR 1.9. Gout improved. Still weak with ambulation.     CURRENT MEDS . calcitRIOL  0.25 mcg Oral Daily  . enoxaparin (LOVENOX) injection  30 mg Subcutaneous Q24H  . furosemide  80 mg Oral Daily  . gabapentin  100 mg Oral BID  . hydrALAZINE  37.5 mg Oral 3 times per day  . hydrocerin   Topical BID  . insulin aspart  0-9 Units Subcutaneous TID WC  . isosorbide mononitrate  30 mg Oral Daily  . magnesium oxide  400 mg Oral BID  . magnesium sulfate 1 - 4 g bolus IVPB  4 g Intravenous Once  . pantoprazole  40 mg Oral Q1200  . predniSONE  40 mg Oral Q breakfast  . sodium chloride  3 mL Intravenous Q12H    OBJECTIVE  Filed Vitals:   03/21/14 2104 03/22/14 0500 03/22/14 2043 03/23/14 0501  BP: 128/66 125/86 105/68 123/73  Pulse:  60 61 58  Temp:  98.5 F (36.9 C) 98.2 F (36.8 C) 98.4 F (36.9 C)  TempSrc:  Axillary Oral Oral  Resp:  16 18 18   Height:      Weight:  80.6 kg (177 lb 11.1 oz)  79.107 kg (174 lb 6.4 oz)  SpO2:  96% 100% 98%    Intake/Output Summary (Last 24 hours) at 03/23/14 0626 Last data filed at 03/23/14 0502  Gross per 24 hour  Intake    600 ml  Output    900 ml  Net   -300 ml   Filed Weights   03/20/14 0547 03/22/14 0500 03/23/14 0501  Weight: 80.5 kg (177 lb 7.5 oz) 80.6 kg (177 lb 11.1 oz) 79.107 kg (174 lb 6.4 oz)    PHYSICAL EXAM  General: Flat affect, NAD. Lying flat Neuro: Alert and oriented X 3. Moves all extremities spontaneously. Psych: Normal affect. HEENT:  Normal  Neck: Supple without bruits JVP 5-6 Lungs:  Resp regular and unlabored, CTA. Heart: tachy regular  no s3, s4, or murmurs.  Abdomen: Soft, tender, non-distended, BS + x 4.  Extremities: No clubbing, cyanosis or edema. DP/PT/Radials 2+ and equal bilaterally.  Accessory Clinical Findings  CBC  Basic  Metabolic Panel  Recent Labs  03/21/14 1720 03/22/14 0440  NA 139 140  K 4.5 4.6  CL 97 97  CO2 30 30  GLUCOSE 109* 105*  BUN 30* 32*  CREATININE 1.76* 1.82*  CALCIUM 9.4 9.6   Liver Function Tests No results found for this basename: AST, ALT, ALKPHOS, BILITOT, PROT, ALBUMIN,  in the last 72 hours  TELE  Sinus tach, 110s freq PVCs   Radiology/Studies  Dg Chest 2 View  03/08/2014   CLINICAL DATA:  Shortness of breath.  EXAM: CHEST  2 VIEW  COMPARISON:  CT ABD/PELV WO CM dated 01/18/2014; DG CHEST 2 VIEW dated 01/17/2014  FINDINGS: AICD noted with lead tip projected over right ventricle.Cardiomegaly with normal pulmonary vascularity. No focal infiltrate. No pleural effusion or pneumothorax. No acute osseous abnormality. Prior cervical spine fusion.  IMPRESSION: 1. Stable severe cardiomegaly. AICD noted in stable position. No CHF. 2. Interim clearing of small right pleural effusion.   Electronically Signed   By: Maisie Fushomas  Register   On: 03/08/2014 08:23   [place] Renal  03/10/2014   CLINICAL DATA Acute kidney injury  EXAM RENAL/URINARY TRACT ULTRASOUND COMPLETE  COMPARISON  01/20/2014  FINDINGS Right Kidney:  Length: 12.4 cm. Lobular contour. Echogenic renal parenchyma, suggesting medical renal disease. No mass or hydronephrosis.  Left Kidney:  Length: 11.5 cm. Lobular contour. Echogenic renal parenchyma, suggesting medical renal disease. No mass or hydronephrosis.  Bladder:  Underdistended but grossly unremarkable.  IMPRESSION Echogenic renal parenchyma, suggesting medical renal disease.  No hydronephrosis.  SIGNATURE  Electronically Signed   By: Charline Bills M.D.   On: 03/10/2014 17:11    ASSESSMENT AND PLAN Andrea [name] is a 63 y.o. 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   1. Acute on Chronic systlic CHF: Her Echo shows an EF of  15% -Failed milrinone wean. Now back on at 0.125 mcg and plan for home Milrinone. No bb. Volume status stable. Continue lasix 80 mg daily. No ace or spiro due to CKD. Continue hydralazine 37.5 mg tid and Imdur 30 mg daily.  2. CAD: s/p PCI with DES to prox RCA (2012) and stent to dist RCA (2012). She is a nonresponder to Plavix. She does not tolerate Brillinta because of wheezing and she cannot receive Effient because of a CVA history. ASA allergy -- No angina, No evidence for acute ischemia.  3. Acute on chronic renal failure stage [id number] to be hemodynamically mediated by low BP in face of poor CO.  -- Creat greatly improved on milrinone. 4.49--> 4.17-->3.41-->2.65-> 2.38 -> 2.2>>>2.04>1.7>1.8-> pending -- Not candidate for HD due to low EF 4. History of liver failure: and cirrhosis based on USG done at [facility] -- LFTs unremarkable except for low albumin and mildly elevated bilirubin.  -- Extensive unremarkable workup done last admission for liver failure which was unrevealing  -- Bbased on [facility] DC summary felt to be due to Cardiomyopathy  5. Hypomagnesemia- 6. Acute Gout- Uric Acid 10.6.On prednisone day 2/3. Add allopurinol 100 daily on d/c.   Improved on milrinone. But remains tachycardic and weak. Will need home milrinone as well as HHRN/HHPT. Likely can go home tomorrow am with close f/u in HF Clinic   Andrea Meres, MD 6:26 AM

## 2014-03-23 NOTE — Progress Notes (Signed)
Physical Therapy Treatment Patient Details Name: Andrea [name] MRN: [id number] DOB: [birth year] Today's Date: 03/23/2014    History of Present Illness 63 year old female patient with history of ischemic dilated cardiomyopathy, ICD, CAD status post stenting, CVA, DM 2, intolerant of ACE inhibitors and ARB's, prolonged hospitalization in January for worsening liver failure and transferred to [facility] for consideration of liver transplant but not felt to be a candidate (note records available), chronic systolic CHF, HTN, dyslipidemia, stage III chronic kidney disease was admitted on 03/08/14 with complaints of nausea and nonbloody emesis. No sickly contacts. She also had some dyspnea.    PT Comments    Pt currently tachycardic (resting HR 122-124) therefore session limited to education for discharge planning. Pt now interested in rollator for home use and agree (with further discussion with pt and OT), that this would be beneficial for pt. She has been unable to ambulate from her bedroom, down the hall, and into kitchen at times when her dyspnea is severe. Rollator would allow her to sit and rest in the tight space of the hallway.    Follow Up Recommendations  Home health PT;Supervision - Intermittent     Equipment Recommendations  Other (comment) (rollator)    Recommendations for Other Services       Precautions / Restrictions Precautions Precautions: Fall    Mobility  Bed Mobility               General bed mobility comments: HR 124 at rest; activity deferred. (HR was 58-64 earlier today)  Transfers                    Ambulation/Gait                 Stairs            Wheelchair Mobility    Modified Rankin (Stroke Patients Only)       Balance                                    Cognition Arousal/Alertness: Awake/alert Behavior During Therapy: WFL for tasks assessed/performed Overall Cognitive Status: Within Functional Limits for  tasks assessed                      Exercises      General Comments General comments (skin integrity, edema, etc.): OT had alerted PT that pt is interested in a rollator for use at home. Discussed with pt the potential benefits and risks of using a rollator. Pt states she at times cannot walk from the bedroom to her kitchen without needing to sit and rest. There is no room in the long hall for a chair. If she is home alone, she is afraid to attempt bedroom to kitchen by herself. Pt agreeable to HHPT for further education on rollator.       Pertinent Vitals/Pain See vitals flow sheet.     Home Living                      Prior Function            PT Goals (current goals can now be found in the care plan section) Progress towards PT goals: Not progressing toward goals - comment (HR precluded activity)    Frequency  Min 3X/week    PT Plan Discharge plan needs to be updated    End  of Session   Activity Tolerance: Treatment limited secondary to medical complications (Comment) (tachycardic) Patient left: in bed;with call bell/phone within reach;with family/visitor present;Other (comment) [facility] representative arrived at end of session)     Time: [id number] PT Time Calculation (min): 10 min  Charges:  $Self Care/Home Management: 8-22                    G Codes:      Lord Lancour 03/23/2014, 3:25 PM Pager [phone]

## 2014-03-24 LAB — BASIC METABOLIC PANEL
BUN: 49 mg/dL — AB (ref 6–23)
CHLORIDE: 94 meq/L — AB (ref 96–112)
CO2: 28 mEq/L (ref 19–32)
Calcium: 9.2 mg/dL (ref 8.4–10.5)
Creatinine, Ser: 2 mg/dL — ABNORMAL HIGH (ref 0.50–1.10)
GFR calc non Af Amer: 26 mL/min — ABNORMAL LOW (ref >90)
GFR, EST AFRICAN AMERICAN: 30 mL/min — AB (ref >90)
Glucose, Bld: 117 mg/dL — ABNORMAL HIGH (ref 70–99)
Potassium: 4.5 mEq/L (ref 3.7–5.3)
SODIUM: 136 meq/L — AB (ref 137–147)

## 2014-03-24 LAB — GLUCOSE, CAPILLARY
GLUCOSE-CAPILLARY: 142 mg/dL — AB (ref 70–99)
Glucose-Capillary: 103 mg/dL — ABNORMAL HIGH (ref 70–99)

## 2014-03-24 LAB — CARBOXYHEMOGLOBIN
CARBOXYHEMOGLOBIN: 1.9 % — AB (ref 0.5–1.5)
Methemoglobin: 1.6 % — ABNORMAL HIGH (ref 0.0–1.5)
O2 SAT: 70 %
Total hemoglobin: 10.9 g/dL — ABNORMAL LOW (ref 12.0–16.0)

## 2014-03-24 MED ORDER — MILRINONE IN DEXTROSE 20 MG/100ML IV SOLN: 0.1250 ug/kg/min | INTRAVENOUS | Status: DC

## 2014-03-24 MED ORDER — FUROSEMIDE 40 MG PO TABS: 80.0000 mg | ORAL_TABLET | Freq: Every day | ORAL | Status: DC

## 2014-03-24 MED ORDER — MAGNESIUM OXIDE 400 (241.3 MG) MG PO TABS: 400.0000 mg | ORAL_TABLET | Freq: Two times a day (BID) | ORAL | Status: DC

## 2014-03-24 MED ORDER — ISOSORBIDE MONONITRATE ER 30 MG PO TB24: 30.0000 mg | ORAL_TABLET | Freq: Every day | ORAL | Status: DC

## 2014-03-24 MED ORDER — GABAPENTIN 100 MG PO CAPS: 100.0000 mg | ORAL_CAPSULE | Freq: Two times a day (BID) | ORAL | Status: DC

## 2014-03-24 MED ORDER — HYDRALAZINE HCL 25 MG PO TABS: 37.5000 mg | ORAL_TABLET | Freq: Three times a day (TID) | ORAL | Status: DC

## 2014-03-24 NOTE — Progress Notes (Signed)
[id number] Education completed with pt and significant other. Discussed CRP 2 and pt gave permission to refer to [place]. Reviewed zones from CHF booklet and pt able to answer teach back questions. Gave low sodium handouts and reviewed fluid restriction. Pt getting rollator.  Luetta Nutting RN BSN 03/24/2014 12:11 PM

## 2014-03-24 NOTE — Progress Notes (Signed)
Discharge instructions along with med list and scripts provided. Patient informed RN that she may not be able to afford medications for home. RN spoke with RN CM. Two prescription discount cards were provided to patient. Patient transported via wheelchair to main lobby for d/c home. Belongings with patient and family.Andrea [name]

## 2014-03-24 NOTE — Consult Note (Signed)
Heart Failure Navigator Consult Note  Presentation: Andrea [name]  is a 63 y.o. 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     Past Medical History  Diagnosis Date  . Chronic systolic heart failure   . Hypertension   . Stroke 02/2010    left brain CVA? recurrent TIA's treated with TPA  . Dyslipidemia   . Premature ventricular contractions (PVCs) (VPCs)   . GERD (gastroesophageal reflux disease)   . Coronary artery disease     a. DES-dRCA 11/2010. b. DES-pRCA 04/2011 (in an area free of disease on prior cath). c. s/p DES-mRCA 03/2013 for NSTEMI.  . Ischemic cardiomyopathy     a. Hx of medtronic ICD. b. EF 15-20% by cath 03/2013.  . Pulmonary hypertension   . Gout   . Obesity   . Thrombocytopenia   . Depression   . Hypotension     a. Admission 09/2012 for hypotn & ARF. b. Meds held 03/2013 due to hypotension.  . Acute renal insufficiency     a. 09/2012. b. Also noted post-cath 03/2013.  . NSTEMI (non-ST elevated myocardial infarction) 03/2013  . Anginal pain   . Asthma   . Microcytic anemia     a. Noted 03/2013 on labs, iron studies WNL.  Marland Kitchen. Anxiety   . CHF (congestive heart failure)   . CKD (chronic kidney disease) stage 3, GFR 30-59 ml/min   . HTN (hypertension)   . Primary pulmonary HTN   . Automatic implantable cardiac defibrillator - Medtronic 08/24/2013  . Heart murmur   . Type II diabetes mellitus   . Migraine headache     "no pain; aura/visual problems only; at least 2-3X/month" (01/18/2014)  . Arthritis     "knees" (01/18/2014)  . DJD (degenerative joint disease)   . Bipolar disorder     History   Social History  . Marital Status: Single    Spouse Name: N/A    Number of Children: 1  . Years of Education: N/A   Occupational History  .     Social History Main Topics  . Smoking status: Former Smoker -- 0.05  packs/day for 5 years    Types: Cigarettes    Quit date: 04/23/1982  . Smokeless tobacco: Never Used  . Alcohol Use: No  . Drug Use: No  . Sexual Activity: Yes   Other Topics Concern  . None   Social History Narrative   ** Merged History Encounter **        ECHO:Study Conclusions-- 03/11/14  - Left ventricle: The cavity size was mildly dilated. Wall thickness was normal. The estimated ejection fraction was 15%. Diffuse hypokinesis. There is akinesis of the entireanteroseptal myocardium. Doppler parameters are consistent with restrictive physiology, indicative of decreased left ventricular diastolic compliance and/or increased left atrial pressure. - Ventricular septum: Septal motion showed paradox. - Mitral valve: Moderate regurgitation. - Left atrium: The atrium was moderately dilated. - Right atrium: The atrium was severely dilated. - Tricuspid valve: Severe regurgitation. - Pulmonary arteries: PA peak pressure: 40mm Hg (S). - Pericardium, extracardiac: A trivial pericardial effusion was identified.   BNP    Component Value Date/Time   PROBNP 2706.0* 03/08/2014 0759    Education Assessment and Provision:  Detailed education and instructions provided on heart failure disease management including the following:  Signs and symptoms of Heart Failure When  to call the physician Importance of daily weights Low sodium diet  Fluid restriction Medication management Anticipated future follow-up appointments  Patient education given on each of the above topics.  Patient and partner acknowledge understanding and acceptance of all instructions.  Andrea [name] and her partner have had a great deal of previous HF education.  Andrea [name] is able to teach back each of the above topics.   Education Materials:  "Living Better With Heart Failure" Booklet, Daily Weight Tracker Tool and Heart Failure Educational Video.   High Risk Criteria for Readmission and/or Poor Patient  Outcomes:    EF <30%- Yes 15%  2 or more admissions in 6 months- Yes  Difficult social situation- No  Demonstrates medication noncompliance- No   Barriers of Care:  Knowledge of medical conditions and ongoing compliance reinforcement.  Discharge Planning:    She will be discharging to home with partner.  She will have HHRN through [place] as well as be sent on IV Milrinone.   She has a follow-up appt. at [facility] clinic on Monday at 10:30am.

## 2014-03-24 NOTE — Discharge Summary (Signed)
Physician Discharge Summary  Andrea [name]:[id number] DOB: Feb 15, 1951 DOA: 03/08/2014  PCP: Lolita Patella, MD  Admit date: 03/08/2014 Discharge date: 03/24/2014  Time spent: 35 minutes  Recommendations for Outpatient Follow-up:  1. Follow up with heart failure clinic in 4 days as scheduled   Discharge Diagnoses:  Active Problems:   Diabetes mellitus type 2, controlled   Coronary artery disease   Dilated cardiomyopathy   Dehydration   Dyspnea   Heart failure   Chronic systolic CHF (congestive heart failure)   Nausea and vomiting   AICD (automatic cardioverter/defibrillator) present   Sinus tachycardia   Renal failure, acute on chronic  Discharge Condition: stable  Diet recommendation: heart healthy, low salt  Filed Weights   03/22/14 0500 03/23/14 0501 03/24/14 0454  Weight: 80.6 kg (177 lb 11.1 oz) 79.107 kg (174 lb 6.4 oz) 80.2 kg (176 lb 12.9 oz)   History of present illness:  Andrea [name] is a 63 y.o. female with PMH liver failure, Ischemic Cardiomyopathy EF 20 %, CKD stage III, who presents to ED complaining of nausea, vomiting (food content) that started morning of admission. She also presents with dyspnea that wake her up this morning. She relates that she is breathing better now. She denies chest pain, abdominal pain, diarrhea. She has 3 to 4 BM when she takes lactulose. She is having trouble remembering things since she was diagnosed with liver problems. Of note during last admission she was transfer to [facility] for further evaluation of liver failure and consideration for transplant. It is not clear cause of liver diseases, but thought to be related to heart failure. She was not a candidate for liver transplant.   Hospital Course:  Acute on Chronic systolic CHF/ischemic dilated cardiomyopathy/CAD/ICD: - No signs of volume overload, cardiology has been consulted while patient hospitalized and guided her management. She will be discharged on Milrinone  infusion, no bb, on Lasix 80 mg daily. Will hold ACEI and spironolactone due to CKD, and will continue hydralazine and Imdur. She was somewhat tachycardic and will be considered to start low dose carvedilol when she will have her follow up appointment in heart failure clinic, and RHC will also be considered. OK with cardiology to be discharged home today.   Acute on chronic stage III kidney disease  - pt had normal creatinine on 1/18, Cr trend: 1.35 -> 2.81 -> 3.8 -> 4.49 -> 4.1 -> 3.4 -> 2.6 -> 2.3 -> 2.2 --> 2.05 --> 2.04 --> 1.78 --> 1.82 --> 1.94 --> 2.00 on discharge. Repeat BMP likely Monday.  - Renal US: suggesting medical renal disease, no hydronephrosis  - pt tolerating current diet well  - patient was discharged on Milrinone per cardiology.  CAD  - management per cardiology  - s/p PCI with DES to prox RCA (2012) and stent to dist RCA (2012). nonresponder to Plavix.  - no evidence of acute ischemia.  Hypokalemia/Hypomagnesemia  - potassium supplemented and WNL on discharge - Mg supplemented as well  Gout  - per patient, improving and less pain  - able to bear weight which is improvement over the past 24 hours  Type II DM with nephropathy:  - reasonable inpatient control  History of liver failure: and cirrhosis based on [facility]G done [facility]  - LFTs unremarkable except for low albumin and mildly elevated bilirubin.  - Extensive unremarkable workup done last admission for liver failure  - based on [facility] DC summary felt to be due to Cardiomyopathy  - ammonia  level is 50, continue lactulose  Foot pain/? Neuropathic  - from gout, now better  - give low dose narcotic as well for better control of the pain   Procedures:  2D echo 3/12 Study Conclusions - Left ventricle: The cavity size was mildly dilated. Wall thickness was normal. The estimated ejection fraction was 15%. Diffuse hypokinesis. There is akinesis of the entireanteroseptal myocardium. Doppler parameters are consistent with  restrictive physiology, indicative of decreased left ventricular diastolic compliance and/or increased left atrial pressure. - Ventricular septum: Septal motion showed paradox. - Mitral valve: Moderate regurgitation. - Left atrium: The atrium was moderately dilated. - Right atrium: The atrium was severely dilated. - Tricuspid valve: Severe regurgitation. - Pulmonary arteries: PA peak pressure: 40mm Hg (S). - Pericardium, extracardiac: A trivial pericardial effusion was identified.  Consultations:  Cardiology, heart failure  Discharge Exam: Filed Vitals:   03/23/14 2022 03/24/14 0454 03/24/14 0811 03/24/14 1708  BP: 133/72 122/76 148/89 139/90  Pulse: 122 123  133  Temp: 98.5 F (36.9 C) 98.2 F (36.8 C)    TempSrc: Oral Axillary    Resp: 18 18    Height:      Weight:  80.2 kg (176 lb 12.9 oz)    SpO2: 97% 98%     General: NAD Cardiovascular: RRR Respiratory: CTA biL  Discharge Instructions  Discharge Orders   Future Appointments Provider Department Dept Phone   03/29/2014 10:30 AM [facility] [phone]   Future Orders Complete By Expires   Amb Referral to Cardiac Rehabilitation  As directed        Medication List    STOP taking these medications       metoprolol tartrate 25 MG tablet  Commonly known as:  LOPRESSOR      TAKE these medications       furosemide 40 MG tablet  Commonly known as:  LASIX  Take 2 tablets (80 mg total) by mouth daily.     gabapentin 100 MG capsule  Commonly known as:  NEURONTIN  Take 1 capsule (100 mg total) by mouth 2 (two) times daily.     hydrALAZINE 25 MG tablet  Commonly known as:  APRESOLINE  Take 1.5 tablets (37.5 mg total) by mouth every 8 (eight) hours.     isosorbide mononitrate 30 MG 24 hr tablet  Commonly known as:  IMDUR  Take 1 tablet (30 mg total) by mouth daily.     lactulose 10 GM/15ML solution  Commonly known as:  CHRONULAC  Take 20 g by mouth 2  (two) times daily as needed for mild constipation.     magnesium oxide 400 (241.3 MG) MG tablet  Commonly known as:  MAG-OX  Take 1 tablet (400 mg total) by mouth 2 (two) times daily.     Melatonin 3 MG Tabs  Take 3 mg by mouth at bedtime as needed (for sleep).     milrinone 20 MG/100ML Soln infusion  Commonly known as:  PRIMACOR  Inject 10.075 mcg/min into the vein continuous.     nitroGLYCERIN 0.4 MG SL tablet  Commonly known as:  NITROSTAT  Place 0.4 mg under the tongue every 5 (five) minutes as needed for chest pain.     omeprazole 20 MG capsule  Commonly known as:  PRILOSEC  Take 20 mg by mouth daily.     traMADol 50 MG tablet  Commonly known as:  ULTRAM  Take 50 mg by mouth daily as needed for  moderate pain.           Follow-up Information   Follow up with CLEGG,AMY, NP On 03/29/2014. (HF Clinic at 10:30 Garage Code [phone] )    Specialty:  Nurse Practitioner   Contact information:   [address] [place] [address] [phone]       Follow up with [facility] to follow for Nursing, Physical therapy)    Contact information:   [address] [place] [address] [phone]       The results of significant diagnostics from this hospitalization (including imaging, microbiology, ancillary and laboratory) are listed below for reference.    Significant Diagnostic Studies: Dg Chest 2 View  03/08/2014   CLINICAL DATA:  Shortness of breath.  EXAM: CHEST  2 VIEW  COMPARISON:  CT ABD/PELV WO CM dated 01/18/2014; DG CHEST 2 VIEW dated 01/17/2014  FINDINGS: AICD noted with lead tip projected over right ventricle.Cardiomegaly with normal pulmonary vascularity. No focal infiltrate. No pleural effusion or pneumothorax. No acute osseous abnormality. Prior cervical spine fusion.  IMPRESSION: 1. Stable severe cardiomegaly. AICD noted in stable position. No CHF. 2. Interim clearing of small right pleural effusion.   Electronically  Signed   By: Maisie Fus  Register   On: 03/08/2014 08:23   US Renal  03/10/2014   CLINICAL DATA Acute kidney injury  EXAM RENAL/URINARY TRACT ULTRASOUND COMPLETE  COMPARISON 01/20/2014  FINDINGS Right Kidney:  Length: 12.4 cm. Lobular contour. Echogenic renal parenchyma, suggesting medical renal disease. No mass or hydronephrosis.  Left Kidney:  Length: 11.5 cm. Lobular contour. Echogenic renal parenchyma, suggesting medical renal disease. No mass or hydronephrosis.  Bladder:  Underdistended but grossly unremarkable.  IMPRESSION Echogenic renal parenchyma, suggesting medical renal disease.  No hydronephrosis.  SIGNATURE  Electronically Signed   By: Charline Bills M.D.   On: 03/10/2014 17:11   Labs: Basic Metabolic Panel:  Recent Labs Lab 03/18/14 0500 03/19/14 0440 03/20/14 0516 03/21/14 1720 03/22/14 0440 03/23/14 0520 03/24/14 0455  NA 135* 139 137 139 140 137 136*  K 4.2 4.5 4.3 4.5 4.6 5.0 4.5  CL 92* 96 96 97 97 95* 94*  CO2 29 31 30 30 30 27 28   GLUCOSE 103* 112* 132* 109* 105* 150* 117*  BUN 40* 37* 32* 30* 32* 40* 49*  CREATININE 2.05* 2.04* 2.04* 1.76* 1.82* 1.94* 2.00*  CALCIUM 8.8 9.1 8.9 9.4 9.6 9.7 9.2  MG 1.2* 1.4* 2.2  --   --   --   --    CBC:  Recent Labs Lab 03/18/14 0500 03/19/14 0440 03/20/14 0516 03/22/14 0440  WBC 5.8 6.4 5.4 6.2  HGB 11.0* 10.8* 10.5* 10.9*  HCT 33.0* 33.0* 31.9* 33.4*  MCV 66.8* 67.3* 67.3* 67.7*  PLT 177 161 167 200   BNP: BNP (last 3 results)  Recent Labs  08/19/13 1024 09/07/13 0955 03/08/14 0759  PROBNP 1769.0* 40.0 2706.0*   CBG:  Recent Labs Lab 03/23/14 1107 03/23/14 1610 03/23/14 2105 03/24/14 0619 03/24/14 1125  GLUCAP 128* 156* 171* 103* 142*    Signed:  Lynard Postlewait  [facility] 03/24/2014, 5:48 PM

## 2014-03-24 NOTE — Progress Notes (Signed)
PT Cancellation Note  Patient Details Name: Andrea [name] MRN: [id number] DOB: 1951-07-14   Cancelled Treatment:    Reason Eval/Treat Not Completed: Medical issues which prohibited therapy. Pt with resting HR at 121 at this time. Spoke with RN; pt has been medicated; will wait and see if resting HR becomes more appropriate this afternoon.    Donnamarie Poag [place], [place] [phone] 03/24/2014, 9:40 AM

## 2014-03-24 NOTE — Progress Notes (Signed)
Patient Name: Andrea [name] Date of Encounter: 03/24/2014   SUBJECTIVE  Milrinone restarted for recurrent low output HF. Feels better. CR 1.9->2.0. Gout improved. Co-ox this am 70%. Weight stable on lasix 80 daily. HR remains 120-130. Feels ok. Anxious to go home.   CURRENT MEDS . calcitRIOL  0.25 mcg Oral Daily  . enoxaparin (LOVENOX) injection  30 mg Subcutaneous Q24H  . furosemide  80 mg Oral Daily  . gabapentin  100 mg Oral BID  . hydrALAZINE  37.5 mg Oral 3 times per day  . hydrocerin   Topical BID  . insulin aspart  0-9 Units Subcutaneous TID WC  . isosorbide mononitrate  30 mg Oral Daily  . magnesium oxide  400 mg Oral BID  . magnesium sulfate 1 - 4 g bolus IVPB  4 g Intravenous Once  . pantoprazole  40 mg Oral Q1200  . sodium chloride  3 mL Intravenous Q12H    OBJECTIVE  Filed Vitals:   03/23/14 1528 03/23/14 2022 03/24/14 0454 03/24/14 0811  BP:  133/72 122/76 148/89  Pulse: 124 122 123   Temp:  98.5 F (36.9 C) 98.2 F (36.8 C)   TempSrc:  Oral Axillary   Resp:  18 18   Height:      Weight:   80.2 kg (176 lb 12.9 oz)   SpO2:  97% 98%     Intake/Output Summary (Last 24 hours) at 03/24/14 0823 Last data filed at 03/24/14 0800  Gross per 24 hour  Intake 1040.9 ml  Output    551 ml  Net  489.9 ml   Filed Weights   03/22/14 0500 03/23/14 0501 03/24/14 0454  Weight: 80.6 kg (177 lb 11.1 oz) 79.107 kg (174 lb 6.4 oz) 80.2 kg (176 lb 12.9 oz)    PHYSICAL EXAM  General: Flat affect, NAD. Lying flat Neuro: Alert and oriented X 3. Moves all extremities spontaneously. Psych: Normal affect. HEENT:  Normal  Neck: Supple without bruits JVP 5-6 Lungs:  Resp regular and unlabored, CTA. Heart: tachy regular  + s3, s4, or murmurs.  Abdomen: Soft, tender, non-distended, BS + x 4.  Extremities: No clubbing, cyanosis or edema. DP/PT/Radials 2+ and equal bilaterally.  Accessory Clinical Findings  CBC  Basic Metabolic Panel  Recent Labs  03/23/14 0520  03/24/14 0455  NA 137 136*  K 5.0 4.5  CL 95* 94*  CO2 27 28  GLUCOSE 150* 117*  BUN 40* 49*  CREATININE 1.94* 2.00*  CALCIUM 9.7 9.2   Liver Function Tests No results found for this basename: AST, ALT, ALKPHOS, BILITOT, PROT, ALBUMIN,  in the last 72 hours  TELE  Sinus tach, 110s freq PVCs   Radiology/Studies  Dg Chest 2 View  03/08/2014   CLINICAL DATA:  Shortness of breath.  EXAM: CHEST  2 VIEW  COMPARISON:  CT ABD/PELV WO CM dated 01/18/2014; DG CHEST 2 VIEW dated 01/17/2014  FINDINGS: AICD noted with lead tip projected over right ventricle.Cardiomegaly with normal pulmonary vascularity. No focal infiltrate. No pleural effusion or pneumothorax. No acute osseous abnormality. Prior cervical spine fusion.  IMPRESSION: 1. Stable severe cardiomegaly. AICD noted in stable position. No CHF. 2. Interim clearing of small right pleural effusion.   Electronically Signed   By: Maisie Fus  Register   On: 03/08/2014 08:23   US Renal  03/10/2014   CLINICAL DATA Acute kidney injury  EXAM RENAL/URINARY TRACT ULTRASOUND COMPLETE  COMPARISON 01/20/2014  FINDINGS Right Kidney:  Length: 12.4 cm. Lobular contour. Echogenic  renal parenchyma, suggesting medical renal disease. No mass or hydronephrosis.  Left Kidney:  Length: 11.5 cm. Lobular contour. Echogenic renal parenchyma, suggesting medical renal disease. No mass or hydronephrosis.  Bladder:  Underdistended but grossly unremarkable.  IMPRESSION Echogenic renal parenchyma, suggesting medical renal disease.  No hydronephrosis.  SIGNATURE  Electronically Signed   By: Charline BillsSriyesh  Krishnan M.D.   On: 03/10/2014 17:11    ASSESSMENT AND PLAN Andrea [name] is a 63 y.o. 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   1. Acute on Chronic systlic CHF: Her Echo shows an EF of 15% -Failed milrinone wean. Now back on at  0.125 mcg and plan for home Milrinone. No bb. Volume status stable. Continue lasix 80 mg daily. No ace or spiro due to CKD. Continue hydralazine 37.5 mg tid and Imdur 30 mg daily.  2. CAD: s/p PCI with DES to prox RCA (2012) and stent to dist RCA (2012). She is a nonresponder to Plavix. She does not tolerate Brillinta because of wheezing and she cannot receive Effient because of a CVA history. ASA allergy -- No angina, No evidence for acute ischemia.  3. Acute on chronic renal failure stage [id number]:suspected to be hemodynamically mediated by low BP in face of poor CO.  -- Creat greatly improved on milrinone. 4.49--> 4.17-->3.41-->2.65-> 2.38 -> 2.2>>>2.04>1.7>1.8->2.0 -- Not candidate for HD due to low EF 4. History of liver failure: and cirrhosis [facility] -- LFTs unremarkable except for low albumin and mildly elevated bilirubin.  -- Extensive unremarkable workup done last admission for liver failure which was unrevealing  -- Bbased on [facility] DC summary felt to be due to Cardiomyopathy  5. Hypomagnesemia- 6. Acute Gout- Uric Acid 10.6.On prednisone day 3/3. Add allopurinol 100 daily on d/c.   Improved on milrinone. She remains tachycardic and somewhat tenuous. We discussed possibility of RHC but she prefers to go home today. Agree with d/c today on current meds including milrinone 0.125 daily. Will see Monday in HF Clinic. Check BMET and consider low dose carvedilol at that point.    Arvilla Meresaniel Bensimhon, MD 8:23 AM

## 2014-03-24 NOTE — Progress Notes (Signed)
Patient ready for discharge but states that she may have changed her mind and wants RHC today if possible. Spoke with cardiologist and he states that he is unable to do RHC today or tomorrow and that patient is stable enough to be discharged home and return next week for RHC. Patient, family and attending informed. Ok to proceed with d/c home. [facility] to [facility] to instruct on home IV pump for IV therapy. Patient and family instructed to pick up rolling walker from [facility] store; directions given per [facility] representative.Mamie Levers

## 2014-03-24 NOTE — Discharge Instructions (Signed)
Heart Failure Heart failure means your heart has trouble pumping blood. This makes it hard for your body to work well. Heart failure is usually a long-term (chronic) condition. You must take good care of yourself and follow your doctor's treatment plan. HOME CARE  Take your heart medicine as told by your doctor.  Do not stop taking medicine unless your doctor tells you to.  Do not skip any dose of medicine.  Refill your medicines before they run out.  Take other medicines only as told by your doctor or pharmacist.  Stay active if told by your doctor. The elderly and people with severe heart failure should talk with a doctor about physical activity.  Eat heart healthy foods. Choose foods that are without trans fat and are low in saturated fat, cholesterol, and salt (sodium). This includes fresh or frozen fruits and vegetables, fish, lean meats, fat-free or low-fat dairy foods, whole grains, and high-fiber foods. Lentils and dried peas and beans (legumes) are also good choices.  Limit salt if told by your doctor.  Cook in a healthy way. Roast, grill, broil, bake, poach, steam, or stir-fry foods.  Limit fluids as told by your doctor.  Weigh yourself every morning. Do this after you pee (urinate) and before you eat breakfast. Write down your weight to give to your doctor.  Take your blood pressure and write it down if your doctor tell you to.  Ask your doctor how to check your pulse. Check your pulse as told.  Lose weight if told by your doctor.  Stop smoking or chewing tobacco. Do not use gum or patches that help you quit without your doctor's approval.  Schedule and go to doctor visits as told.  Nonpregnant women should have no more than 1 drink a day. Men should have no more than 2 drinks a day. Talk to your doctor about drinking alcohol.  Stop illegal drug use.  Stay current with shots (immunizations).  Manage your health conditions as told by your doctor.  Learn to manage  your stress.  Rest when you are tired.  If it is really hot outside:  Avoid intense activities.  Use air conditioning or fans, or get in a cooler place.  Avoid caffeine and alcohol.  Wear loose-fitting, lightweight, and light-colored clothing.  If it is really cold outside:  Avoid intense activities.  Layer your clothing.  Wear mittens or gloves, a hat, and a scarf when going outside.  Avoid alcohol.  Learn about heart failure and get support as needed.  Get help to maintain or improve your quality of life and your ability to care for yourself as needed. GET HELP IF:   You gain 03 lb/1.4 kg or more in 1 day or 05 lb/2.3 kg in a week.  You are more short of breath than usual.  You cannot do your normal activities.  You tire easily.  You cough more than normal, especially with activity.  You have any or more puffiness (swelling) in areas such as your hands, feet, ankles, or belly (abdomen).  You cannot sleep because it is hard to breathe.  You feel like your heart is beating fast (palpitations).  You get dizzy or lightheaded when you stand up. GET HELP RIGHT AWAY IF:   You have trouble breathing.  There is a change in mental status, such as becoming less alert or not being able to focus.  You have chest pain or discomfort.  You faint. MAKE SURE YOU:   Understand these   instructions.  Will watch your condition.  Will get help right away if you are not doing well or get worse. Document Released: 09/25/2008 Document Revised: 04/13/2013 Document Reviewed: 07/17/2012 [employer] Patient Information 2014 [employer].  

## 2014-03-29 ENCOUNTER — Encounter (HOSPITAL_COMMUNITY): Payer: Self-pay

## 2014-03-29 ENCOUNTER — Telehealth: Payer: Self-pay | Admitting: Physician Assistant

## 2014-03-29 ENCOUNTER — Other Ambulatory Visit (HOSPITAL_COMMUNITY): Payer: Self-pay

## 2014-03-29 ENCOUNTER — Ambulatory Visit (HOSPITAL_COMMUNITY)
Admission: RE | Admit: 2014-03-29 | Discharge: 2014-03-29 | Disposition: A | Discharge status: Home / Self Care | Payer: BC Managed Care – PPO | Source: Ambulatory Visit | Attending: Internal Medicine | Admitting: Internal Medicine

## 2014-03-29 ENCOUNTER — Encounter: Payer: Self-pay | Admitting: Licensed Clinical Social Worker

## 2014-03-29 VITALS — BP 134/80 | HR 118 | Resp 20 | Wt 170.0 lb

## 2014-03-29 DIAGNOSIS — I5022 Chronic systolic (congestive) heart failure: Secondary | ICD-10-CM | POA: Insufficient documentation

## 2014-03-29 DIAGNOSIS — I2510 Atherosclerotic heart disease of native coronary artery without angina pectoris: Secondary | ICD-10-CM | POA: Insufficient documentation

## 2014-03-29 DIAGNOSIS — N189 Chronic kidney disease, unspecified: Secondary | ICD-10-CM | POA: Insufficient documentation

## 2014-03-29 DIAGNOSIS — Z95810 Presence of automatic (implantable) cardiac defibrillator: Secondary | ICD-10-CM | POA: Insufficient documentation

## 2014-03-29 DIAGNOSIS — I509 Heart failure, unspecified: Secondary | ICD-10-CM

## 2014-03-29 LAB — COMPREHENSIVE METABOLIC PANEL
ALK PHOS: 62 U/L (ref 39–117)
ALT: 10 U/L (ref 0–35)
AST: 18 U/L (ref 0–37)
Albumin: 3 g/dL — ABNORMAL LOW (ref 3.5–5.2)
BUN: 35 mg/dL — AB (ref 6–23)
CO2: 24 mEq/L (ref 19–32)
Calcium: 9.4 mg/dL (ref 8.4–10.5)
Chloride: 99 mEq/L (ref 96–112)
Creatinine, Ser: 1.34 mg/dL — ABNORMAL HIGH (ref 0.50–1.10)
GFR calc non Af Amer: 41 mL/min — ABNORMAL LOW (ref >90)
GFR, EST AFRICAN AMERICAN: 48 mL/min — AB (ref >90)
Glucose, Bld: 226 mg/dL — ABNORMAL HIGH (ref 70–99)
POTASSIUM: 4.1 meq/L (ref 3.7–5.3)
Sodium: 140 mEq/L (ref 137–147)
TOTAL PROTEIN: 7.3 g/dL (ref 6.0–8.3)
Total Bilirubin: 0.9 mg/dL (ref 0.3–1.2)

## 2014-03-29 LAB — CARBOXYHEMOGLOBIN
Carboxyhemoglobin: 1.3 % (ref 0.5–1.5)
METHEMOGLOBIN: 0.8 % (ref 0.0–1.5)
O2 SAT: 75.7 %
TOTAL HEMOGLOBIN: 11.5 g/dL — AB (ref 12.0–16.0)

## 2014-03-29 MED ORDER — FUROSEMIDE 40 MG PO TABS: 40.0000 mg | ORAL_TABLET | Freq: Every day | ORAL | Status: DC

## 2014-03-29 MED ORDER — HYDRALAZINE HCL 25 MG PO TABS: 37.5000 mg | ORAL_TABLET | Freq: Three times a day (TID) | ORAL | Status: DC

## 2014-03-29 NOTE — Addendum Note (Signed)
Encounter addended by: Sherald Hess, NP on: 03/29/2014  1:34 PM<BR>     Documentation filed: Visit Diagnoses, Notes Section, Orders

## 2014-03-29 NOTE — Progress Notes (Addendum)
Patient ID: Andrea [name], female   DOB: Aug 14, 1951, 63 y.o.   MRN: [id number]  Weight Range   Baseline proBNP     HPI: Andrea [name] is a 63 y.o. female with a history of CAD DES to prox RHC and stent to dist RCA in 2012, chronic systolic heart failure, (EF 15%) s/p ICD placement, liver failure thougth to be from cardiomyopathy -seen at [facility], CVA, CKD, and DM who was admitted on 03/08/14 with N/V and dyspnea. Admitting creatinine was 1.3 but increased to 4.49 and this was thought to be from hypotension and low output heart failure.  She was placed on Milrinone and diuresed with IV lasix. Milrinone was weaned off but restarted due to mixed venous saturation 39%. At that point she was discharged on Milrinone at 0.125 mcg via PICC. Discharge creatinine was 2.0. Discharge weight was 176 pounds.    She returns for post hospital follow up with her partner, Andrea [name]. Complain of fatigue. Denies SOB/PND/Orthopnea/CP. Appetite poor.  Weight at home trending down from 174 to 169 pounds. Andrea [name] is prepares medications. She has only been taking hydralazine once a day. Followed by Andrea [name] and  [facility] providing Milrinone and weekly BMET on Wednesday.    ECHO 03/11/14 EF 15% RV ok   SH: Lives with partner, Andrea [name] and 87 year old son. She does not drive. Quit smoking 25 years ago. She does not drink alcohol  FH:   Mom Heart Failure          Father MI          Brother Heart Dissase       ROS: All systems negative except as listed in HPI, PMH and Problem List.  Past Medical History  Diagnosis Date  . Chronic systolic heart failure   . Hypertension   . Stroke 02/2010    left brain CVA? recurrent TIA's treated with TPA  . Dyslipidemia   . Premature ventricular contractions (PVCs) (VPCs)   . GERD (gastroesophageal reflux disease)   . Coronary artery disease     a. DES-dRCA 11/2010. b. DES-pRCA 04/2011 (in an area free of disease on prior cath). c. s/p DES-mRCA 03/2013 for NSTEMI.  .  Ischemic cardiomyopathy     a. Hx of medtronic ICD. b. EF 15-20% by cath 03/2013.  . Pulmonary hypertension   . Gout   . Obesity   . Thrombocytopenia   . Depression   . Hypotension     a. Admission 09/2012 for hypotn & ARF. b. Meds held 03/2013 due to hypotension.  . Acute renal insufficiency     a. 09/2012. b. Also noted post-cath 03/2013.  . NSTEMI (non-ST elevated myocardial infarction) 03/2013  . Anginal pain   . Asthma   . Microcytic anemia     a. Noted 03/2013 on labs, iron studies WNL.  Marland Kitchen Anxiety   . CHF (congestive heart failure)   . CKD (chronic kidney disease) stage 3, GFR 30-59 ml/min   . HTN (hypertension)   . Primary pulmonary HTN   . Automatic implantable cardiac defibrillator - Medtronic 08/24/2013  . Heart murmur   . Type II diabetes mellitus   . Migraine headache     "no pain; aura/visual problems only; at least 2-3X/month" (01/18/2014)  . Arthritis     "knees" (01/18/2014)  . DJD (degenerative joint disease)   . Bipolar disorder     Current Outpatient Prescriptions  Medication Sig Dispense Refill  . furosemide (LASIX) 40 MG tablet Take 2 tablets (80  mg total) by mouth daily.  60 tablet  1  . gabapentin (NEURONTIN) 100 MG capsule Take 1 capsule (100 mg total) by mouth 2 (two) times daily.  60 capsule  0  . hydrALAZINE (APRESOLINE) 25 MG tablet Take 37.5 mg by mouth daily.      . isosorbide mononitrate (IMDUR) 30 MG 24 hr tablet Take 1 tablet (30 mg total) by mouth daily.  60 tablet  0  . lactulose (CHRONULAC) 10 GM/15ML solution Take 20 g by mouth 2 (two) times daily as needed for mild constipation.       . magnesium oxide (MAG-OX) 400 (241.3 MG) MG tablet Take 1 tablet (400 mg total) by mouth 2 (two) times daily.  60 tablet  0  . Melatonin 3 MG TABS Take 3 mg by mouth at bedtime as needed (for sleep).       . milrinone (PRIMACOR) 20 MG/100ML SOLN infusion Inject 10.075 mcg/min into the vein continuous.  100 mL    . nitroGLYCERIN (NITROSTAT) 0.4 MG SL tablet Place  0.4 mg under the tongue every 5 (five) minutes as needed for chest pain.      Marland Kitchen omeprazole (PRILOSEC) 20 MG capsule Take 20 mg by mouth daily.       . traMADol (ULTRAM) 50 MG tablet Take 50 mg by mouth daily as needed for moderate pain.       No current facility-administered medications for this encounter.     PHYSICAL EXAM: Filed Vitals:   03/29/14 1105  BP: 134/80  Pulse: 118  Resp: 20  Weight: 170 lb (77.111 kg)  SpO2: 99%    General:  Chronically ill appearing. No resp difficulty. Andrea [name] present HEENT: normal Neck: supple. JVP flat. Carotids 2+ bilaterally; no bruits. No lymphadenopathy or thryomegaly appreciated. Cor: PMI normal. Tachycardic Regular rate & rhythm. No rubs, or murmurs. + S3  Lungs: clear Abdomen: soft, nontender, nondistended. No hepatosplenomegaly. No bruits or masses. Good bowel sounds. Extremities: no cyanosis, clubbing, rash, edema RUE 2 lumen PICC Neuro: alert & orientedx3, cranial nerves grossly intact. Moves all 4 extremities w/o difficulty. Affect pleasant   ASSESSMENT & PLAN:  1. Chronic Systolic HF, ICM has ICD. EF 15% RV ok on ECHO 03/11/14. She continues on  home milrinone at 0.125 mcg She returns for post hospital follow up. Overall she has NYHA IIIB symptoms. Weight  at home trending down. Volume status low will cut back on lasix to 40 mg once a day.  She is not on bb due to low output. She is not on ace, spiro on dig due to CKD She has only been taking hydralazine 37.5 mg once a day. I have stressed the importance of taking hydralazine three times a day. Will not up titrate but will consider increasing at next visit as long as she is taking it 3 times a day. Continue Imdur 30 mg daily.  BMET will be checked 03/31/14 by [facility].  Today I have set up referral to HF SW for goals of care. Will bring her back in 2 weeks and at that time will consider LHC/RHC/CPX and discuss advanced therapies.   While discussing goals of care she was sob and  dizzy. SBP 96/60 . Given 250 cc NS bolus. Check CMET and CO-OX. Increase milrinone to 0.25 mcg. SBP when she discahrged 112/60. Discharge from HF clinic in stable condition.   2. CKD- BMET 03/31/14  per Andrea [name]. No ace/spiro  3. CAD S/P PCI with DES to prox RCA and  stent to dist RCA in 2012 . No evidence if ischemia.   4. History of liver failure and cirrhosis. Treated at [facility]---> thoguth to be from cardiomyopathy  Follow up next  Monday.  Tommy Minichiello,AMYNP-C  12:40 PM

## 2014-03-29 NOTE — Patient Instructions (Signed)
Follow up in 2 weeks  Take lasix 40 mg  Daily  Pleas take hydralazine 37.5 mg (1 /1/2 tabs) three times a day  Do the following things EVERYDAY: 1) Weigh yourself in the morning before breakfast. Write it down and keep it in a log. 2) Take your medicines as prescribed 3) Eat low salt foods-Limit salt (sodium) to 2000 mg per day.  4) Stay as active as you can everyday 5) Limit all fluids for the day to less than 2 liters

## 2014-03-29 NOTE — Progress Notes (Signed)
CSW met with patient and Mliss Sax (SO) to discuss goals of care. Patient and Mliss Sax state that they have been together for 23 years and have both a Living Will and HPOA. CSW discussed disease process and specifics regarding goals of care. Patient was able to verbalize her wishes for end of life and discussed various possible scenarios regarding health care issues. They spoke of their 63yo son whom they have guardianship since he was 63 years old. Patient states that she has started some conversations with him about her current health status and continues to maintain open communication with her son and SO about her health. CSW provided supportive intervention and will continue to provide support around goals of care discussion. CSW will meet with patient and Mliss Sax on next clinic visit. Raquel Sarna, [place]

## 2014-03-29 NOTE — Telephone Encounter (Signed)
[name] called to clarify the med changes to [name]'s Lasix and Hydralazine from today's OV with Amy Clegg. I reaffirmed hydralazine 37.5mg  TID (q8hr) and Lasix 40mg  daily per Amy's note. She verbalized understanding and gratitude. Girtha Kilgore PA-C

## 2014-03-29 NOTE — Addendum Note (Signed)
Encounter addended by: Sherald Hess, NP on: 03/29/2014  4:43 PM<BR>     Documentation filed: Notes Section

## 2014-03-31 ENCOUNTER — Encounter (HOSPITAL_COMMUNITY): Payer: BC Managed Care – PPO

## 2014-04-02 ENCOUNTER — Telehealth: Payer: Self-pay | Admitting: Internal Medicine

## 2014-04-02 NOTE — Telephone Encounter (Signed)
New message    FYI Got letter stating she is overdue for a device check.  She says she has been in/out of the hosp for the last 26mo.  Her defibulator has not been check in 6 or more months.  She has not changed physicians and will call to make an appt as soon as she can get transportation.

## 2014-04-04 ENCOUNTER — Telehealth: Payer: Self-pay | Admitting: Cardiology

## 2014-04-04 NOTE — Telephone Encounter (Signed)
Pt called saying she had gained 3 lbs overnight. She denies any increased SOB. She says she has been instructed to call for wgt gain of 3 lbs or more. I suggested she take a total of 80 mg of Lasix today and tomorrow and to call the heart failure clinic Tuesday with her wgt.  Corine Shelter PA-C 04/04/2014 8:49 AM

## 2014-04-05 ENCOUNTER — Ambulatory Visit (HOSPITAL_COMMUNITY)
Admission: RE | Admit: 2014-04-05 | Discharge: 2014-04-05 | Disposition: A | Discharge status: Home / Self Care | Payer: BC Managed Care – PPO | Source: Ambulatory Visit | Attending: Internal Medicine | Admitting: Internal Medicine

## 2014-04-05 ENCOUNTER — Encounter (HOSPITAL_COMMUNITY): Payer: Self-pay | Admitting: *Deleted

## 2014-04-05 VITALS — BP 106/48 | HR 134 | Wt 172.0 lb

## 2014-04-05 DIAGNOSIS — I2510 Atherosclerotic heart disease of native coronary artery without angina pectoris: Secondary | ICD-10-CM | POA: Insufficient documentation

## 2014-04-05 DIAGNOSIS — I5022 Chronic systolic (congestive) heart failure: Secondary | ICD-10-CM

## 2014-04-05 DIAGNOSIS — I498 Other specified cardiac arrhythmias: Secondary | ICD-10-CM

## 2014-04-05 DIAGNOSIS — R000 Tachycardia, unspecified: Secondary | ICD-10-CM

## 2014-04-05 DIAGNOSIS — I509 Heart failure, unspecified: Secondary | ICD-10-CM

## 2014-04-05 NOTE — Patient Instructions (Signed)
Stop Isosorbide (Imdur)  Labs with Home Health  Catheterization on Monday 4/13, see instruction sheet  Your physician recommends that you schedule a follow-up appointment in: 3-4 weeks

## 2014-04-05 NOTE — Progress Notes (Signed)
Patient ID: Andrea [name], female   DOB: 1951/10/23, 63 y.o.   MRN: [id number]   Weight Range   Baseline proBNP     HPI: Andrea [name] is a 63 y.o. female with a history of CAD DES to prox RHC and stent to dist RCA in 2012, chronic systolic heart failure, (EF 15%) s/p ICD placement, liver failure thougth to be from cardiomyopathy -seen at [facility], CVA, CKD, and DM who was admitted on 03/08/14 with N/V and dyspnea. Admitting creatinine was 1.3 but increased to 4.49 and this was thought to be from hypotension and low output heart failure.  She was placed on Milrinone and diuresed with IV lasix. Milrinone was weaned off but restarted due to mixed venous saturation 39%. At that point she was discharged on Milrinone at 0.125 mcg via PICC. Discharge creatinine was 2.0. Discharge weight was 176 pounds.    She was seen in Clinic last week and weight was trending down to 169. Lasix cut back to 40 mg daily. Also emphasized need to take hydralazine 3x/day. She called this past Sunday due to weight gain of 3 pounds. Weight back down.    She returns for follow up with her partner, Andrea [name]. Complains of ongoing fatigue. Denies SOB/PND/Orthopnea/CP. Feels a little better with milrinone but still quite limited. Fatigued with ADLs. Severe HAs with IMDUR. Followed by [facility] providing Milrinone and weekly BMET on Wednesdays.    Labs 03/29/14; Cr 1.3 k 4.1 co-ox 76%  ECHO 03/11/14 EF 15% RV ok   SH: Lives with partner, Andrea [name] and 31 year old son. She does not drive. Quit smoking 25 years ago. She does not drink alcohol  FH:   Mom Heart Failure          Father MI          Brother Heart Dissase     ROS: All systems negative except as listed in HPI, PMH and Problem List.  Past Medical History  Diagnosis Date  . Chronic systolic heart failure   . Hypertension   . Stroke 02/2010    left brain CVA? recurrent TIA's treated with TPA  . Dyslipidemia   . Premature ventricular contractions (PVCs) (VPCs)   .  GERD (gastroesophageal reflux disease)   . Coronary artery disease     a. DES-dRCA 11/2010. b. DES-pRCA 04/2011 (in an area free of disease on prior cath). c. s/p DES-mRCA 03/2013 for NSTEMI.  . Ischemic cardiomyopathy     a. Hx of medtronic ICD. b. EF 15-20% by cath 03/2013.  . Pulmonary hypertension   . Gout   . Obesity   . Thrombocytopenia   . Depression   . Hypotension     a. Admission 09/2012 for hypotn & ARF. b. Meds held 03/2013 due to hypotension.  . Acute renal insufficiency     a. 09/2012. b. Also noted post-cath 03/2013.  . NSTEMI (non-ST elevated myocardial infarction) 03/2013  . Anginal pain   . Asthma   . Microcytic anemia     a. Noted 03/2013 on labs, iron studies WNL.  Marland Kitchen Anxiety   . CHF (congestive heart failure)   . CKD (chronic kidney disease) stage 3, GFR 30-59 ml/min   . HTN (hypertension)   . Primary pulmonary HTN   . Automatic implantable cardiac defibrillator - Medtronic 08/24/2013  . Heart murmur   . Type II diabetes mellitus   . Migraine headache     "no pain; aura/visual problems only; at least 2-3X/month" (01/18/2014)  .  Arthritis     "knees" (01/18/2014)  . DJD (degenerative joint disease)   . Bipolar disorder     Current Outpatient Prescriptions  Medication Sig Dispense Refill  . furosemide (LASIX) 40 MG tablet Take 1 tablet (40 mg total) by mouth daily.  60 tablet  6  . gabapentin (NEURONTIN) 100 MG capsule Take 1 capsule (100 mg total) by mouth 2 (two) times daily.  60 capsule  0  . hydrALAZINE (APRESOLINE) 25 MG tablet Take 1.5 tablets (37.5 mg total) by mouth 3 (three) times daily.  135 tablet  6  . isosorbide mononitrate (IMDUR) 30 MG 24 hr tablet Take 1 tablet (30 mg total) by mouth daily.  60 tablet  0  . lactulose (CHRONULAC) 10 GM/15ML solution Take 20 g by mouth 2 (two) times daily as needed for mild constipation.       . magnesium oxide (MAG-OX) 400 (241.3 MG) MG tablet Take 1 tablet (400 mg total) by mouth 2 (two) times daily.  60 tablet  0   . Melatonin 3 MG TABS Take 3 mg by mouth at bedtime as needed (for sleep).       . milrinone (PRIMACOR) 20 MG/100ML SOLN infusion Inject 10.075 mcg/min into the vein continuous.  100 mL    . nitroGLYCERIN (NITROSTAT) 0.4 MG SL tablet Place 0.4 mg under the tongue every 5 (five) minutes as needed for chest pain.      Marland Kitchen. omeprazole (PRILOSEC) 20 MG capsule Take 20 mg by mouth daily.       . traMADol (ULTRAM) 50 MG tablet Take 50 mg by mouth daily as needed for moderate pain.       No current facility-administered medications for this encounter.     PHYSICAL EXAM: Filed Vitals:   04/05/14 1337  BP: 106/48  Pulse: 134  Weight: 172 lb (78.019 kg)  SpO2: 96%   General:  No acute distress. No resp difficulty. Andrea [name] present HEENT: normal Neck: supple. JVP flat. Carotids 2+ bilaterally; no bruits. No lymphadenopathy or thryomegaly appreciated. Cor: PMI normal. Tachycardic Regular rate & rhythm. No rubs, or murmurs. + S3  Lungs: clear Abdomen: soft, nontender, nondistended. No hepatosplenomegaly. No bruits or masses. Good bowel sounds. Extremities: no cyanosis, clubbing, rash, edema RUE 2 lumen PICC Neuro: alert & orientedx3, cranial nerves grossly intact. Moves all 4 extremities w/o difficulty. Affect pleasant   ASSESSMENT & PLAN:  1. Chronic Systolic HF, ICM has ICD. EF 15% RV ok on ECHO 03/11/14. She continues on  home milrinone at 0.125 mcg Continues with NYHA IIIB symptom despite IV milrinone. Renal function now back to baseline. Weight stabilized on lower dose of lasix. She remains quite tachycardic despite improved co-x.  She is not on bb due to low output. She is not on ace, spiro on dig due to CKD  2. CKD- Cardiorenal syndrome. Creatinine improved back to baseline Cr 1.3 on milrinone.   3. CAD S/P PCI with DES to prox RCA and stent to dist RCA in 2012 . No evidence of ischemia.   4. History of liver failure and cirrhosis. Treated at [facility]---> thought to be from  cardiomyopathy  Long discussion about her situation and how advanced her HF is. We discussed options of continuing with milrinone vs considering VAD or transplant. Given her persistent and profound sinus tachycardia, I am worried she will not due well with milrinone for much longer. I provided her information on VADs and will also have her speak with our VAD coordinator and  SW. She would likely benefit from meeting a VAD patient. In the interim, we will proceed with R and L heart cath. If creatinine remains stable consider adding digoxin at next visit.    Total time spent 50 minutes. Over half that time spent discussing above.    [name] MD 2:29 PM

## 2014-04-05 NOTE — Progress Notes (Signed)
VAD coordinator met with patient and caregiver per Dr. Clayborne Dana request.  Had lengthy discussion with patient and caregiver describing HM II VAD. I have reviewed in full the following; caregiver role, life style changes, follow up care, discharge planning, rehabilitation, VAD dressing supplies and driveline management care. The patient was provided with educational resources to take home and read more about the device offered. The patient understands that from this discussion it does not mean that they will receive the device, that an extensive evaluation must be completed prior to VAD being offered as an option.  Both patient and caregiver were tearful during presentation; are "overwhelmed" at this point, but pt states she "wants to live". Caregiver states she will support Andrea [name] "in any way necessary".  Encouraged both to review educational material, access [employer] web site for more information. Pt returning next week for right and left heart cath per Dr. Haroldine Laws and will be thinking about VAD evaluation as an option.     I have provided the patient with my contact information in order for them to call back if they have any questions.

## 2014-04-06 ENCOUNTER — Encounter: Payer: Self-pay | Admitting: Anesthesiology

## 2014-04-06 ENCOUNTER — Encounter (HOSPITAL_COMMUNITY): Payer: Self-pay | Admitting: Pharmacy Technician

## 2014-04-08 ENCOUNTER — Ambulatory Visit (INDEPENDENT_AMBULATORY_CARE_PROVIDER_SITE_OTHER): Payer: BC Managed Care – PPO | Admitting: *Deleted

## 2014-04-08 ENCOUNTER — Encounter: Payer: Self-pay | Admitting: Internal Medicine

## 2014-04-08 DIAGNOSIS — I428 Other cardiomyopathies: Secondary | ICD-10-CM

## 2014-04-08 DIAGNOSIS — R000 Tachycardia, unspecified: Secondary | ICD-10-CM

## 2014-04-08 DIAGNOSIS — Z95810 Presence of automatic (implantable) cardiac defibrillator: Secondary | ICD-10-CM

## 2014-04-08 DIAGNOSIS — I429 Cardiomyopathy, unspecified: Secondary | ICD-10-CM

## 2014-04-08 DIAGNOSIS — I498 Other specified cardiac arrhythmias: Secondary | ICD-10-CM

## 2014-04-08 LAB — MDC_IDC_ENUM_SESS_TYPE_INCLINIC
Brady Statistic RV Percent Paced: 0 %
Date Time Interrogation Session: 20150409160042
HIGH POWER IMPEDANCE MEASURED VALUE: 35 Ohm
HIGH POWER IMPEDANCE MEASURED VALUE: 35 Ohm
HIGH POWER IMPEDANCE MEASURED VALUE: 36 Ohm
HIGH POWER IMPEDANCE MEASURED VALUE: 36 Ohm
HIGH POWER IMPEDANCE MEASURED VALUE: 39 Ohm
HIGH POWER IMPEDANCE MEASURED VALUE: 45 Ohm
HIGH POWER IMPEDANCE MEASURED VALUE: 46 Ohm
HIGH POWER IMPEDANCE MEASURED VALUE: 46 Ohm
HIGH POWER IMPEDANCE MEASURED VALUE: 47 Ohm
HIGH POWER IMPEDANCE MEASURED VALUE: 47 Ohm
HIGH POWER IMPEDANCE MEASURED VALUE: 48 Ohm
HighPow Impedance: 35 Ohm
HighPow Impedance: 35 Ohm
HighPow Impedance: 36 Ohm
HighPow Impedance: 36 Ohm
HighPow Impedance: 36 Ohm
HighPow Impedance: 36 Ohm
HighPow Impedance: 37 Ohm
HighPow Impedance: 37 Ohm
HighPow Impedance: 37 Ohm
HighPow Impedance: 38 Ohm
HighPow Impedance: 39 Ohm
HighPow Impedance: 44 Ohm
HighPow Impedance: 45 Ohm
HighPow Impedance: 45 Ohm
HighPow Impedance: 45 Ohm
HighPow Impedance: 45 Ohm
HighPow Impedance: 46 Ohm
HighPow Impedance: 46 Ohm
HighPow Impedance: 47 Ohm
HighPow Impedance: 49 Ohm
Lead Channel Impedance Value: 376 Ohm
Lead Channel Impedance Value: 400 Ohm
Lead Channel Impedance Value: 408 Ohm
Lead Channel Impedance Value: 416 Ohm
Lead Channel Impedance Value: 416 Ohm
Lead Channel Impedance Value: 416 Ohm
Lead Channel Impedance Value: 424 Ohm
Lead Channel Impedance Value: 424 Ohm
Lead Channel Impedance Value: 424 Ohm
Lead Channel Sensing Intrinsic Amplitude: 10.1 mV
Lead Channel Sensing Intrinsic Amplitude: 10.1 mV
Lead Channel Sensing Intrinsic Amplitude: 10.3 mV
Lead Channel Sensing Intrinsic Amplitude: 10.5 mV
Lead Channel Sensing Intrinsic Amplitude: 12.1 mV
Lead Channel Sensing Intrinsic Amplitude: 8.1 mV
Lead Channel Sensing Intrinsic Amplitude: 8.7 mV
Lead Channel Sensing Intrinsic Amplitude: 9.3 mV
Lead Channel Sensing Intrinsic Amplitude: 9.5 mV
Lead Channel Sensing Intrinsic Amplitude: 9.9 mV
Lead Channel Setting Sensing Sensitivity: 0.3 mV
MDC IDC MSMT BATTERY VOLTAGE: 2.83 V
MDC IDC MSMT LEADCHNL RV IMPEDANCE VALUE: 368 Ohm
MDC IDC MSMT LEADCHNL RV IMPEDANCE VALUE: 408 Ohm
MDC IDC MSMT LEADCHNL RV IMPEDANCE VALUE: 408 Ohm
MDC IDC MSMT LEADCHNL RV IMPEDANCE VALUE: 416 Ohm
MDC IDC MSMT LEADCHNL RV IMPEDANCE VALUE: 416 Ohm
MDC IDC MSMT LEADCHNL RV IMPEDANCE VALUE: 416 Ohm
MDC IDC MSMT LEADCHNL RV SENSING INTR AMPL: 10.5 mV
MDC IDC MSMT LEADCHNL RV SENSING INTR AMPL: 11 mV
MDC IDC MSMT LEADCHNL RV SENSING INTR AMPL: 11.1 mV
MDC IDC MSMT LEADCHNL RV SENSING INTR AMPL: 11.7 mV
MDC IDC MSMT LEADCHNL RV SENSING INTR AMPL: 9.9 mV
MDC IDC SET LEADCHNL RV PACING AMPLITUDE: 2 V
MDC IDC SET LEADCHNL RV PACING PULSEWIDTH: 0.4 ms
MDC IDC SET ZONE DETECTION INTERVAL: 330 ms
Zone Setting Detection Interval: 240 ms
Zone Setting Detection Interval: 300 ms

## 2014-04-08 MED ORDER — METOPROLOL TARTRATE 25 MG PO TABS: 25.0000 mg | ORAL_TABLET | Freq: Two times a day (BID) | ORAL | Status: DC

## 2014-04-08 NOTE — Progress Notes (Signed)
ICD check in clinic due to shock x1 this morning. Pt received had 16 ATP episodes since mid Feb---started on metoprolol 25mg  BID. Pt being assessed by Dr. Gala Romney for LVAD. Normal device function. Sensing consistent with previous device measurements. Impedance trends stable over time. Changed VF NID to 30/40, VT NID to 32. Device programmed at appropriate safety margins. Device programmed to optimize intrinsic conduction. Battery @2 .83V. ROV w/ GT in 77mo.

## 2014-04-09 ENCOUNTER — Telehealth (HOSPITAL_COMMUNITY): Payer: Self-pay | Admitting: Cardiology

## 2014-04-09 NOTE — Telephone Encounter (Signed)
Spoke w/pt she states she has some edema in hands, denies increased SOB, she states she has already taken extra 40 mg of Lasix today since wt is up, she is sch for RHC on Mon

## 2014-04-09 NOTE — Telephone Encounter (Signed)
Scott called to report weight gain Weight 4/8 171 Weight 4/10 174   Please advise if needed

## 2014-04-12 ENCOUNTER — Encounter (HOSPITAL_COMMUNITY): Payer: BC Managed Care – PPO

## 2014-04-12 ENCOUNTER — Telehealth (HOSPITAL_COMMUNITY): Payer: Self-pay | Admitting: Anesthesiology

## 2014-04-12 ENCOUNTER — Encounter (HOSPITAL_COMMUNITY)
Admission: RE | Disposition: A | Discharge status: Home / Self Care | Payer: Self-pay | Source: Ambulatory Visit | Attending: Internal Medicine

## 2014-04-12 ENCOUNTER — Ambulatory Visit (HOSPITAL_COMMUNITY)
Admission: RE | Admit: 2014-04-12 | Discharge: 2014-04-12 | Disposition: A | Discharge status: Home / Self Care | Payer: BC Managed Care – PPO | Source: Ambulatory Visit | Attending: Internal Medicine | Admitting: Internal Medicine

## 2014-04-12 DIAGNOSIS — Z8673 Personal history of transient ischemic attack (TIA), and cerebral infarction without residual deficits: Secondary | ICD-10-CM | POA: Insufficient documentation

## 2014-04-12 DIAGNOSIS — E785 Hyperlipidemia, unspecified: Secondary | ICD-10-CM | POA: Insufficient documentation

## 2014-04-12 DIAGNOSIS — I2589 Other forms of chronic ischemic heart disease: Secondary | ICD-10-CM | POA: Insufficient documentation

## 2014-04-12 DIAGNOSIS — J45909 Unspecified asthma, uncomplicated: Secondary | ICD-10-CM | POA: Insufficient documentation

## 2014-04-12 DIAGNOSIS — Z9861 Coronary angioplasty status: Secondary | ICD-10-CM | POA: Insufficient documentation

## 2014-04-12 DIAGNOSIS — M1990 Unspecified osteoarthritis, unspecified site: Secondary | ICD-10-CM | POA: Insufficient documentation

## 2014-04-12 DIAGNOSIS — I2510 Atherosclerotic heart disease of native coronary artery without angina pectoris: Secondary | ICD-10-CM

## 2014-04-12 DIAGNOSIS — N183 Chronic kidney disease, stage 3 unspecified: Secondary | ICD-10-CM | POA: Insufficient documentation

## 2014-04-12 DIAGNOSIS — I129 Hypertensive chronic kidney disease with stage 1 through stage 4 chronic kidney disease, or unspecified chronic kidney disease: Secondary | ICD-10-CM | POA: Insufficient documentation

## 2014-04-12 DIAGNOSIS — I252 Old myocardial infarction: Secondary | ICD-10-CM | POA: Insufficient documentation

## 2014-04-12 DIAGNOSIS — E669 Obesity, unspecified: Secondary | ICD-10-CM | POA: Insufficient documentation

## 2014-04-12 DIAGNOSIS — E119 Type 2 diabetes mellitus without complications: Secondary | ICD-10-CM | POA: Insufficient documentation

## 2014-04-12 DIAGNOSIS — I2789 Other specified pulmonary heart diseases: Secondary | ICD-10-CM | POA: Insufficient documentation

## 2014-04-12 DIAGNOSIS — M109 Gout, unspecified: Secondary | ICD-10-CM | POA: Insufficient documentation

## 2014-04-12 DIAGNOSIS — R011 Cardiac murmur, unspecified: Secondary | ICD-10-CM | POA: Insufficient documentation

## 2014-04-12 DIAGNOSIS — I509 Heart failure, unspecified: Secondary | ICD-10-CM | POA: Insufficient documentation

## 2014-04-12 DIAGNOSIS — G43909 Migraine, unspecified, not intractable, without status migrainosus: Secondary | ICD-10-CM | POA: Insufficient documentation

## 2014-04-12 DIAGNOSIS — I428 Other cardiomyopathies: Secondary | ICD-10-CM | POA: Insufficient documentation

## 2014-04-12 DIAGNOSIS — F319 Bipolar disorder, unspecified: Secondary | ICD-10-CM | POA: Insufficient documentation

## 2014-04-12 DIAGNOSIS — Z95810 Presence of automatic (implantable) cardiac defibrillator: Secondary | ICD-10-CM | POA: Insufficient documentation

## 2014-04-12 DIAGNOSIS — F411 Generalized anxiety disorder: Secondary | ICD-10-CM | POA: Insufficient documentation

## 2014-04-12 DIAGNOSIS — D696 Thrombocytopenia, unspecified: Secondary | ICD-10-CM | POA: Insufficient documentation

## 2014-04-12 DIAGNOSIS — K219 Gastro-esophageal reflux disease without esophagitis: Secondary | ICD-10-CM | POA: Insufficient documentation

## 2014-04-12 DIAGNOSIS — I5022 Chronic systolic (congestive) heart failure: Secondary | ICD-10-CM | POA: Insufficient documentation

## 2014-04-12 DIAGNOSIS — Z87891 Personal history of nicotine dependence: Secondary | ICD-10-CM | POA: Insufficient documentation

## 2014-04-12 HISTORY — PX: LEFT AND RIGHT HEART CATHETERIZATION WITH CORONARY ANGIOGRAM: SHX5449

## 2014-04-12 LAB — POCT I-STAT 3, ART BLOOD GAS (G3+)
ACID-BASE DEFICIT: 3 mmol/L — AB (ref 0.0–2.0)
BICARBONATE: 22.3 meq/L (ref 20.0–24.0)
O2 Saturation: 91 %
TCO2: 23 mmol/L (ref 0–100)
pCO2 arterial: 38.6 mmHg (ref 35.0–45.0)
pH, Arterial: 7.369 (ref 7.350–7.450)
pO2, Arterial: 62 mmHg — ABNORMAL LOW (ref 80.0–100.0)

## 2014-04-12 LAB — POCT I-STAT 3, VENOUS BLOOD GAS (G3P V)
Acid-base deficit: 3 mmol/L — ABNORMAL HIGH (ref 0.0–2.0)
Acid-base deficit: 5 mmol/L — ABNORMAL HIGH (ref 0.0–2.0)
BICARBONATE: 23.2 meq/L (ref 20.0–24.0)
Bicarbonate: 21.8 mEq/L (ref 20.0–24.0)
O2 SAT: 49 %
O2 Saturation: 46 %
PCO2 VEN: 44.2 mmHg — AB (ref 45.0–50.0)
PCO2 VEN: 44.5 mmHg — AB (ref 45.0–50.0)
PO2 VEN: 28 mmHg — AB (ref 30.0–45.0)
PO2 VEN: 28 mmHg — AB (ref 30.0–45.0)
TCO2: 25 mmol/L (ref 0–100)
TCO2: 23 mmol/L (ref 0–100)
pH, Ven: 7.299 (ref 7.250–7.300)
pH, Ven: 7.328 — ABNORMAL HIGH (ref 7.250–7.300)

## 2014-04-12 LAB — GLUCOSE, CAPILLARY
Glucose-Capillary: 145 mg/dL — ABNORMAL HIGH (ref 70–99)
Glucose-Capillary: 141 mg/dL — ABNORMAL HIGH (ref 70–99)

## 2014-04-12 LAB — BASIC METABOLIC PANEL
BUN: 29 mg/dL — ABNORMAL HIGH (ref 6–23)
CO2: 21 mEq/L (ref 19–32)
Calcium: 9.7 mg/dL (ref 8.4–10.5)
Chloride: 102 mEq/L (ref 96–112)
Creatinine, Ser: 1.12 mg/dL — ABNORMAL HIGH (ref 0.50–1.10)
GFR, EST AFRICAN AMERICAN: 60 mL/min — AB (ref >90)
GFR, EST NON AFRICAN AMERICAN: 52 mL/min — AB (ref >90)
GLUCOSE: 142 mg/dL — AB (ref 70–99)
POTASSIUM: 4.6 meq/L (ref 3.7–5.3)
Sodium: 139 mEq/L (ref 137–147)

## 2014-04-12 LAB — POCT ACTIVATED CLOTTING TIME: Activated Clotting Time: 171 seconds

## 2014-04-12 LAB — CBC
HCT: 34 % — ABNORMAL LOW (ref 36.0–46.0)
Hemoglobin: 11.3 g/dL — ABNORMAL LOW (ref 12.0–15.0)
MCH: 22.4 pg — ABNORMAL LOW (ref 26.0–34.0)
MCHC: 33.2 g/dL (ref 30.0–36.0)
MCV: 67.5 fL — ABNORMAL LOW (ref 78.0–100.0)
PLATELETS: 200 10*3/uL (ref 150–400)
RBC: 5.04 MIL/uL (ref 3.87–5.11)
RDW: 19.6 % — AB (ref 11.5–15.5)
WBC: 6.6 10*3/uL (ref 4.0–10.5)

## 2014-04-12 LAB — PROTIME-INR
INR: 1.1 (ref 0.00–1.49)
PROTHROMBIN TIME: 14 s (ref 11.6–15.2)

## 2014-04-12 SURGERY — LEFT AND RIGHT HEART CATHETERIZATION WITH CORONARY ANGIOGRAM
Anesthesia: LOCAL

## 2014-04-12 MED ORDER — SODIUM CHLORIDE 0.9 % IV SOLN: 250.0000 mL | INTRAVENOUS | Status: DC | PRN

## 2014-04-12 MED ORDER — SODIUM CHLORIDE 0.9 % IJ SOLN: 3.0000 mL | INTRAMUSCULAR | Status: DC | PRN

## 2014-04-12 MED ORDER — MILRINONE IN DEXTROSE 20 MG/100ML IV SOLN: 0.3750 ug/kg/min | INTRAVENOUS | Status: AC

## 2014-04-12 MED ORDER — AMIODARONE HCL 200 MG PO TABS: 200.0000 mg | ORAL_TABLET | Freq: Two times a day (BID) | ORAL | Status: DC

## 2014-04-12 MED ORDER — SODIUM CHLORIDE 0.9 % IV SOLN
INTRAVENOUS | Status: AC
Start: 2014-04-12 — End: 2014-04-12

## 2014-04-12 MED ORDER — SODIUM CHLORIDE 0.9 % IJ SOLN: 3.0000 mL | Freq: Two times a day (BID) | INTRAMUSCULAR | Status: DC

## 2014-04-12 MED ORDER — SODIUM CHLORIDE 0.9 % IV SOLN: INTRAVENOUS | Status: DC

## 2014-04-12 MED ORDER — MIDAZOLAM HCL 2 MG/2ML IJ SOLN
INTRAMUSCULAR | Status: AC
  Filled 2014-04-12: qty 2

## 2014-04-12 MED ORDER — VERAPAMIL HCL 2.5 MG/ML IV SOLN
INTRAVENOUS | Status: AC
  Filled 2014-04-12: qty 2

## 2014-04-12 MED ORDER — HEPARIN (PORCINE) IN NACL 2-0.9 UNIT/ML-% IJ SOLN
INTRAMUSCULAR | Status: AC
  Filled 2014-04-12: qty 1000

## 2014-04-12 MED ORDER — LIDOCAINE HCL (PF) 1 % IJ SOLN
INTRAMUSCULAR | Status: AC
  Filled 2014-04-12: qty 30

## 2014-04-12 MED ORDER — HEPARIN SODIUM (PORCINE) 1000 UNIT/ML IJ SOLN
INTRAMUSCULAR | Status: AC
  Filled 2014-04-12: qty 1

## 2014-04-12 MED ORDER — FENTANYL CITRATE 0.05 MG/ML IJ SOLN
INTRAMUSCULAR | Status: AC
  Filled 2014-04-12: qty 2

## 2014-04-12 NOTE — Discharge Instructions (Signed)

## 2014-04-12 NOTE — Telephone Encounter (Signed)
Pt had R/LHC this am and showed low CO. Will increase milrinone to 0.375 and start amiodarone 200 mg PO BID to hopefully prevent any more arrythmia's.  Aundria Rud NP-C 10:09 AM

## 2014-04-12 NOTE — Interval H&P Note (Signed)
History and Physical Interval Note:  04/12/2014 7:49 AM  Andrea [name]  has presented today for surgery, with the diagnosis of Heart Failure  The various methods of treatment have been discussed with the patient and family. After consideration of risks, benefits and other options for treatment, the patient has consented to  Procedure(s): LEFT AND RIGHT HEART CATHETERIZATION WITH CORONARY ANGIOGRAM (N/A) and possible angioplasty Cath Lab Visit (complete for each Cath Lab visit)  Clinical Evaluation Leading to the Procedure:   ACS: no  Non-ACS:    Anginal Classification: CCS III  Anti-ischemic medical therapy: Maximal Therapy (2 or more classes of medications)  Non-Invasive Test Results: No non-invasive testing performed  Prior CABG: No previous CABG      as a surgical intervention .  The patient's history has been reviewed, patient examined, no change in status, stable for surgery.  I have reviewed the patient's chart and labs.  Questions were answered to the patient's satisfaction.     Andrea [name]

## 2014-04-12 NOTE — CV Procedure (Signed)
Cardiac Cath Procedure Note:  Indication: Heart failure  Procedures performed:  1) Selective coronary angiography 2) Left heart catheterization 3) Left ventriculogram 4) Right heart cath  Description of procedure:   The risks and indication of the procedure were explained. Consent was signed and placed on the chart. An appropriate timeout was taken prior to the procedure.    The right neck was prepped and draped in routine sterile fashion. A 7FR sheath was placed in the RIJ and Swan Ganz catheter was used for the RHC.  After a normal Allen's test was confirmed, the right wrist was prepped and draped in the routine sterile fashion and anesthetized with 1% local lidocaine.   A 5 FR arterial sheath was then placed in the right radial artery using a modified Seldinger technique. Systemic heparin was administered. 3mg  IV verapamil was given through the sheath. Standard catheters including a JL 3.0, JR4 and straight pigtail were used. All catheter exchanges were made over a wire. The procedure was very difficult due to tortuosity of the brachiocephalic vessels.  Complications:  None apparent  Findings:  RA = 10 RV = 60/4/8 PA = 60/30 (42) PCWP =  25 Fick CO/CI = 3.6/2.1  Thermo CO/CI = 2.9/1.6 PVR = 5.8 SVR = 1,911 Ao sat =  91% PA sats = 46%, 49%  Ao Pressure: 93/69 (80) LV Pressure: 98/9/23 There was no signficant gradient across the aortic valve on pullback.  Left main: Normal  LAD: Long vessel courses to apex. Mild plaque throughout. 50-60% lesion in ostium of 2nd diagonal  LCX: Made up primarily of large branching OM-1. Mild plaque. 30-40% stenosis at ostium of upper branch  RCA: Dominant vessel with long stent through proximal and midsection. Mild plaque throughout. 40% distal  LV-gram done in the RAO projection: Poorly opacified Ejection fraction = 20-25% with severe global HK  Assessment: 1. Nonobstructive CAD with patent stent RCA 2. Severe NICM EF 20-25% 3.  Persistent low-output and pulmonary HTN despite milrinone support  Plan/Discussion:  Will increase milrinone to 0.375. Consider addition of PDE-5 inhibitor. Continue to discuss advanced therapies.  Bevelyn Buckles Bensimhon,MD 9:16 AM

## 2014-04-12 NOTE — H&P (View-Only) (Signed)
Patient ID: [name], female   DOB: 1951/10/23, 63 y.o.   MRN: [id number]   Weight Range   Baseline proBNP     HPI: Andrea [name] is a 63 y.o. female with a history of CAD DES to prox RHC and stent to dist RCA in 2012, chronic systolic heart failure, (EF 15%) s/p ICD placement, liver failure thougth to be from cardiomyopathy -seen at [facility], CVA, CKD, and DM who was admitted on 03/08/14 with N/V and dyspnea. Admitting creatinine was 1.3 but increased to 4.49 and this was thought to be from hypotension and low output heart failure.  She was placed on Milrinone and diuresed with IV lasix. Milrinone was weaned off but restarted due to mixed venous saturation 39%. At that point she was discharged on Milrinone at 0.125 mcg via PICC. Discharge creatinine was 2.0. Discharge weight was 176 pounds.    She was seen in Clinic last week and weight was trending down to 169. Lasix cut back to 40 mg daily. Also emphasized need to take hydralazine 3x/day. She called this past Sunday due to weight gain of 3 pounds. Weight back down.    She returns for follow up with her partner, Andrea [name]. Complains of ongoing fatigue. Denies SOB/PND/Orthopnea/CP. Feels a little better with milrinone but still quite limited. Fatigued with ADLs. Severe HAs with IMDUR. [facility] by [facility] providing Milrinone and weekly BMET on Wednesdays.    Labs 03/29/14; Cr 1.3 k 4.1 co-ox 76%  ECHO 03/11/14 EF 15% RV ok   SH: Lives with partner, Andrea [name] and 31 year old son. She does not drive. Quit smoking 25 years ago. She does not drink alcohol  FH:   Mom Heart Failure          Father MI          Brother Heart Dissase     ROS: All systems negative except as listed in HPI, PMH and Problem List.  Past Medical History  Diagnosis Date  . Chronic systolic heart failure   . Hypertension   . Stroke 02/2010    left brain CVA? recurrent TIA's treated with TPA  . Dyslipidemia   . Premature ventricular contractions (PVCs) (VPCs)   .  GERD (gastroesophageal reflux disease)   . Coronary artery disease     a. DES-dRCA 11/2010. b. DES-pRCA 04/2011 (in an area free of disease on prior cath). c. s/p DES-mRCA 03/2013 for NSTEMI.  . Ischemic cardiomyopathy     a. Hx of medtronic ICD. b. EF 15-20% by cath 03/2013.  . Pulmonary hypertension   . Gout   . Obesity   . Thrombocytopenia   . Depression   . Hypotension     a. Admission 09/2012 for hypotn & ARF. b. Meds held 03/2013 due to hypotension.  . Acute renal insufficiency     a. 09/2012. b. Also noted post-cath 03/2013.  . NSTEMI (non-ST elevated myocardial infarction) 03/2013  . Anginal pain   . Asthma   . Microcytic anemia     a. Noted 03/2013 on labs, iron studies WNL.  Marland Kitchen Anxiety   . CHF (congestive heart failure)   . CKD (chronic kidney disease) stage 3, GFR 30-59 ml/min   . HTN (hypertension)   . Primary pulmonary HTN   . Automatic implantable cardiac defibrillator - Medtronic 08/24/2013  . Heart murmur   . Type II diabetes mellitus   . Migraine headache     "no pain; aura/visual problems only; at least 2-3X/month" (01/18/2014)  .  Arthritis     "knees" (01/18/2014)  . DJD (degenerative joint disease)   . Bipolar disorder     Current Outpatient Prescriptions  Medication Sig Dispense Refill  . furosemide (LASIX) 40 MG tablet Take 1 tablet (40 mg total) by mouth daily.  60 tablet  6  . gabapentin (NEURONTIN) 100 MG capsule Take 1 capsule (100 mg total) by mouth 2 (two) times daily.  60 capsule  0  . hydrALAZINE (APRESOLINE) 25 MG tablet Take 1.5 tablets (37.5 mg total) by mouth 3 (three) times daily.  135 tablet  6  . isosorbide mononitrate (IMDUR) 30 MG 24 hr tablet Take 1 tablet (30 mg total) by mouth daily.  60 tablet  0  . lactulose (CHRONULAC) 10 GM/15ML solution Take 20 g by mouth 2 (two) times daily as needed for mild constipation.       . magnesium oxide (MAG-OX) 400 (241.3 MG) MG tablet Take 1 tablet (400 mg total) by mouth 2 (two) times daily.  60 tablet  0   . Melatonin 3 MG TABS Take 3 mg by mouth at bedtime as needed (for sleep).       . milrinone (PRIMACOR) 20 MG/100ML SOLN infusion Inject 10.075 mcg/min into the vein continuous.  100 mL    . nitroGLYCERIN (NITROSTAT) 0.4 MG SL tablet Place 0.4 mg under the tongue every 5 (five) minutes as needed for chest pain.      Marland Kitchen. omeprazole (PRILOSEC) 20 MG capsule Take 20 mg by mouth daily.       . traMADol (ULTRAM) 50 MG tablet Take 50 mg by mouth daily as needed for moderate pain.       No current facility-administered medications for this encounter.     PHYSICAL EXAM: Filed Vitals:   04/05/14 1337  BP: 106/48  Pulse: 134  Weight: 172 lb (78.019 kg)  SpO2: 96%   General:  No acute distress. No resp difficulty. Andrea [name] present HEENT: normal Neck: supple. JVP flat. Carotids 2+ bilaterally; no bruits. No lymphadenopathy or thryomegaly appreciated. Cor: PMI normal. Tachycardic Regular rate & rhythm. No rubs, or murmurs. + S3  Lungs: clear Abdomen: soft, nontender, nondistended. No hepatosplenomegaly. No bruits or masses. Good bowel sounds. Extremities: no cyanosis, clubbing, rash, edema RUE 2 lumen PICC Neuro: alert & orientedx3, cranial nerves grossly intact. Moves all 4 extremities w/o difficulty. Affect pleasant   ASSESSMENT & PLAN:  1. Chronic Systolic HF, ICM has ICD. EF 15% RV ok on ECHO 03/11/14. She continues on  home milrinone at 0.125 mcg Continues with NYHA IIIB symptom despite IV milrinone. Renal function now back to baseline. Weight stabilized on lower dose of lasix. She remains quite tachycardic despite improved co-x.  She is not on bb due to low output. She is not on ace, spiro on dig due to CKD  2. CKD- Cardiorenal syndrome. Creatinine improved back to baseline Cr 1.3 on milrinone.   3. CAD S/P PCI with DES to prox RCA and stent to dist RCA in 2012 . No evidence of ischemia.   4. History of liver failure and cirrhosis. Treated at [facility]---> thought to be from  cardiomyopathy  Long discussion about her situation and how advanced her HF is. We discussed options of continuing with milrinone vs considering VAD or transplant. Given her persistent and profound sinus tachycardia, I am worried she will not due well with milrinone for much longer. I provided her information on VADs and will also have her speak with our VAD coordinator and  SW. She would likely benefit from meeting a VAD patient. In the interim, we will proceed with R and L heart cath. If creatinine remains stable consider adding digoxin at next visit.    Total time spent 50 minutes. Over half that time spent discussing above.    [name] MD 2:29 PM

## 2014-04-12 NOTE — Interval H&P Note (Signed)
History and Physical Interval Note:  04/12/2014 7:48 AM  Andrea [name]  has presented today for surgery, with the diagnosis of Heart Failure  The various methods of treatment have been discussed with the patient and family. After consideration of risks, benefits and other options for treatment, the patient has consented to  Procedure(s): LEFT AND RIGHT HEART CATHETERIZATION WITH CORONARY ANGIOGRAM (N/A) and possible angioplasty as a surgical intervention .  The patient's history has been reviewed, patient examined, no change in status, stable for surgery.  I have reviewed the patient's chart and labs.  Questions were answered to the patient's satisfaction.     Bevelyn Buckles Keilani Terrance

## 2014-04-19 ENCOUNTER — Telehealth: Payer: Self-pay | Admitting: Licensed Clinical Social Worker

## 2014-04-19 ENCOUNTER — Telehealth (HOSPITAL_COMMUNITY): Payer: Self-pay | Admitting: Cardiology

## 2014-04-19 NOTE — Telephone Encounter (Signed)
Andrea [name] called to report 3 lb weight gain overnight for pt  Spoke with pt denies increased SOB, edema  Pt states she just ate too much over the weekend Pt advised to continue current meds and call if weight continues to trend up

## 2014-04-19 NOTE — Telephone Encounter (Signed)
CSW contacted patient at the request of Cherlyn Roberts, VAD Coordinator to discuss social assessment. Patient states that she would like to proceed with further information regarding the LVAD and stated "I know that I need to qualify". Patient states that she and her primary caregiver will meet with CSW next week at 12noon in the clinic. Lasandra Beech, LCSW [phone]

## 2014-04-20 ENCOUNTER — Other Ambulatory Visit (HOSPITAL_COMMUNITY): Payer: Self-pay | Admitting: *Deleted

## 2014-04-20 DIAGNOSIS — I509 Heart failure, unspecified: Secondary | ICD-10-CM

## 2014-04-20 DIAGNOSIS — I255 Ischemic cardiomyopathy: Secondary | ICD-10-CM

## 2014-04-20 DIAGNOSIS — Z01818 Encounter for other preprocedural examination: Secondary | ICD-10-CM

## 2014-04-21 ENCOUNTER — Other Ambulatory Visit (HOSPITAL_COMMUNITY): Payer: Self-pay | Admitting: Cardiology

## 2014-04-21 DIAGNOSIS — I5022 Chronic systolic (congestive) heart failure: Secondary | ICD-10-CM

## 2014-04-21 MED ORDER — MAGNESIUM OXIDE 400 (241.3 MG) MG PO TABS: 400.0000 mg | ORAL_TABLET | Freq: Two times a day (BID) | ORAL | Status: DC

## 2014-04-23 ENCOUNTER — Institutional Professional Consult (permissible substitution) (INDEPENDENT_AMBULATORY_CARE_PROVIDER_SITE_OTHER): Payer: BC Managed Care – PPO | Admitting: Cardiothoracic Surgery

## 2014-04-23 ENCOUNTER — Encounter: Payer: Self-pay | Admitting: Cardiothoracic Surgery

## 2014-04-23 VITALS — BP 115/75 | HR 92 | Resp 16 | Ht 60.0 in | Wt 171.0 lb

## 2014-04-23 DIAGNOSIS — I5022 Chronic systolic (congestive) heart failure: Secondary | ICD-10-CM

## 2014-04-23 NOTE — Progress Notes (Signed)
PCP is Lolita PatellaEADE,ROBERT ALEXANDER, MD Referring Provider is Bensimhon, Bevelyn Bucklesaniel R, MD  Chief Complaint  Patient presents with  . Congestive Heart Failure    eval for VAD...had R/L HEART CATH  04/12/14, PFT'S scheduled for 04/26/14   patient examined, coronary angiogram and 2-D echo cardiogram reviewed  HPI: 63 year old African American female with advanced heart failure followed carefully in her care clinic presents for surgical evaluation of possible destination LVAD for severe ischemic and nonischemic cardiomyopathy. 2011 the patient had a DMI and a PCI drug-eluting stent was placed in the RCA. Because of her low ejection fraction an AICD was placed She took Plavix for one year. In January 2015 she presented to the hospital in cardiogenic shock with renal and hepatic malperfusion and multisystem failure. [facility] liver [facility] but was felt to have hemodynamic compromise as the etiology of her acute liver failure and she was started on heart failure management.  In March a 2-D echocardiogram showed EF of 15%, LV diameter 5.5 cm, moderate MR, moderate to severe TR with mild to moderate RV dysfunction.  In April she underwent right heart cath demonstrating PA pressures 60/30 wedge 25 CVP 10 cardiac index 1.6 PA saturation 46% and she was placed on milrinone 0.25  Coronary angiograms at that time demonstrated a patent RCA stent, no significant CAD   She states her AICD his discharged once, that occurring approximately a month ago.  Patient is currently on home milrinone with dyspnea exertion but has not required rehospitalization. She is been evaluated and felt to be a potential candidate for destination therapy implantable LVAD. The patient has a stable social situation, appears to be medically compliant, is a nonsmoker, and all of her viral serologies were checked at [facility] and are negative.  Prior to LVAD implantation she will need colonoscopy, chest CT  scan, lower extremityultrasound rule out DVT, and possibly liver biopsy. There is no history of alcohol use.  Risk factors for her LVAD implantation appear to be her short stature and obesity, financial limitations, and RV dysfunction the patient had studies which have suggested portal hypertension as well. The patient is not on Coumadin and there is no history of GI bleeding.  Past Medical History  Diagnosis Date  . Chronic systolic heart failure   . Hypertension   . Stroke 02/2010    left brain CVA? recurrent TIA's treated with TPA  . Dyslipidemia   . Premature ventricular contractions (PVCs) (VPCs)   . GERD (gastroesophageal reflux disease)   . Coronary artery disease     a. DES-dRCA 11/2010. b. DES-pRCA 04/2011 (in an area free of disease on prior cath). c. s/p DES-mRCA 03/2013 for NSTEMI.  . Ischemic cardiomyopathy     a. Hx of medtronic ICD. b. EF 15-20% by cath 03/2013.  . Pulmonary hypertension   . Gout   . Obesity   . Thrombocytopenia   . Depression   . Hypotension     a. Admission 09/2012 for hypotn & ARF. b. Meds held 03/2013 due to hypotension.  . Acute renal insufficiency     a. 09/2012. b. Also noted post-cath 03/2013.  . NSTEMI (non-ST elevated myocardial infarction) 03/2013  . Anginal pain   . Asthma   . Microcytic anemia     a. Noted 03/2013 on labs, iron studies WNL.  Marland Kitchen. Anxiety   . CHF (congestive heart failure)   . CKD (chronic kidney disease) stage 3, GFR 30-59 ml/min   . HTN (hypertension)   .  Primary pulmonary HTN   . Automatic implantable cardiac defibrillator - Medtronic 08/24/2013  . Heart murmur   . Type II diabetes mellitus   . Migraine headache     "no pain; aura/visual problems only; at least 2-3X/month" (01/18/2014)  . Arthritis     "knees" (01/18/2014)  . DJD (degenerative joint disease)   . Bipolar disorder     Past Surgical History  Procedure Laterality Date  . Mass excision Left     hip  . Cervical polypectomy  1970's  . US echocardiography   2011  . Tonsillectomy  1960's?  . Right oophorectomy  1980's  . Dilation and curettage of uterus  1970's  . Ostectomy metatarsal Left 1993    "5th toe; from rickets" (05/07/2013)  . Implantable cardioverter defibrillator implant  ~ 2008  . Coronary angioplasty with stent placement  2000's    "2 stents" (05/07/2013)  . Coronary angioplasty with stent placement  03/2013    PCI to RCA/notes 05/05/2013; "makes total of 3" (05/07/2013)    Family History  Problem Relation Age of Onset  . Heart failure Mother   . Heart attack Father   . Heart disease Brother     Social History History  Substance Use Topics  . Smoking status: Former Smoker -- 0.05 packs/day for 5 years    Types: Cigarettes    Quit date: 04/23/1982  . Smokeless tobacco: Never Used  . Alcohol Use: No    Current Outpatient Prescriptions  Medication Sig Dispense Refill  . amiodarone (PACERONE) 200 MG tablet Take 1 tablet (200 mg total) by mouth 2 (two) times daily.  60 tablet  3  . DULoxetine (CYMBALTA) 60 MG capsule Take 60 mg by mouth daily.      . furosemide (LASIX) 40 MG tablet Take 1 tablet (40 mg total) by mouth daily.  60 tablet  6  . gabapentin (NEURONTIN) 100 MG capsule Take 1 capsule (100 mg total) by mouth 2 (two) times daily.  60 capsule  0  . hydrALAZINE (APRESOLINE) 25 MG tablet Take 1.5 tablets (37.5 mg total) by mouth 3 (three) times daily.  135 tablet  6  . isosorbide mononitrate (IMDUR) 30 MG 24 hr tablet Take 30 mg by mouth daily.      . magnesium oxide (MAG-OX) 400 (241.3 MG) MG tablet Take 1 tablet (400 mg total) by mouth 2 (two) times daily.  60 tablet  6  . metoprolol tartrate (LOPRESSOR) 25 MG tablet Take 1 tablet (25 mg total) by mouth 2 (two) times daily.  180 tablet  3  . milrinone (PRIMACOR) 20 MG/100ML SOLN infusion Inject 30.225 mcg/min into the vein continuous.  100 mL    . nitroGLYCERIN (NITROSTAT) 0.4 MG SL tablet Place 0.4 mg under the tongue every 5 (five) minutes as needed for chest pain.       Marland Kitchen omeprazole (PRILOSEC) 20 MG capsule Take 20 mg by mouth daily.       No current facility-administered medications for this visit.    Allergies  Allergen Reactions  . Asa [Aspirin] Shortness Of Breath  . Aspirin Shortness Of Breath and Other (See Comments)    Asthma symptoms  . Brilinta [Ticagrelor] Shortness Of Breath and Other (See Comments)    Gout   . Percocet [Oxycodone-Acetaminophen] Nausea And Vomiting  . Darvon Nausea And Vomiting  . Imitrex [Sumatriptan Base] Other (See Comments)    Unknown reaction  . Licorice Flavor [Artificial Licorice Flavor]     Black licorice induces  asthma   . Nsaids Nausea And Vomiting  . Nsaids Other (See Comments)    GI upset     Review of Systems Patient is right-hand dominant She has diabetes She's had no more symptoms of delirium or confusion after resolving her acute liver failure in January She has successfully undergone right into rectum he tonsillectomy ICD implantation   BP 115/75  Pulse 92  Resp 16  Ht 5' (1.524 m)  Wt 171 lb (77.565 kg)  BMI 33.40 kg/m2  SpO2 98% Physical Exam Pleasant short obese female accompanied by her partner in no acute distress with a right arm PICC line infusing milrinone HEENT with some poor dentition pupils equal Neck without JVD mass or bruit Lungs clear--the patient walked 200 feet in the office hallway and her oxygen saturation remained at 97% room air Heart regular rhythm with a 3/6 systolic murmur Abdomen obese soft nontender without pulsatile mass, liver appears slightly enlarged Extremities without edema cyanosis tenderness Peripheral pulses intact Neuro nonfocal alert and oriented  Diagnostic Tests: Heart cath, echocardiogram CT abdomen and [facility] records reviewed  Impression: Ischemic cardiomyopathy, advanced heart failure on home IV milrinone Appears to be acceptable candidate for gestation therapy LVAD implantation at increased risk due to her obesity, short stature and RV  dysfunction With BMI 34 she would not be a heart transplant candidate  Plan:Patient will need to complete evaluation including colonoscopy CT scan and chest and possible liver biopsy pending GI consultation

## 2014-04-26 ENCOUNTER — Ambulatory Visit (HOSPITAL_COMMUNITY)
Admission: RE | Admit: 2014-04-26 | Discharge: 2014-04-26 | Disposition: A | Discharge status: Home / Self Care | Payer: BC Managed Care – PPO | Source: Ambulatory Visit | Attending: Cardiothoracic Surgery | Admitting: Cardiothoracic Surgery

## 2014-04-26 ENCOUNTER — Encounter: Payer: Self-pay | Admitting: Adult Health

## 2014-04-26 ENCOUNTER — Other Ambulatory Visit (HOSPITAL_COMMUNITY): Payer: Self-pay | Admitting: *Deleted

## 2014-04-26 VITALS — BP 124/62 | HR 94 | Wt 175.5 lb

## 2014-04-26 DIAGNOSIS — I5022 Chronic systolic (congestive) heart failure: Secondary | ICD-10-CM | POA: Insufficient documentation

## 2014-04-26 DIAGNOSIS — N189 Chronic kidney disease, unspecified: Secondary | ICD-10-CM | POA: Insufficient documentation

## 2014-04-26 DIAGNOSIS — I2510 Atherosclerotic heart disease of native coronary artery without angina pectoris: Secondary | ICD-10-CM | POA: Insufficient documentation

## 2014-04-26 DIAGNOSIS — Z01818 Encounter for other preprocedural examination: Secondary | ICD-10-CM

## 2014-04-26 DIAGNOSIS — I509 Heart failure, unspecified: Secondary | ICD-10-CM

## 2014-04-26 DIAGNOSIS — I259 Chronic ischemic heart disease, unspecified: Secondary | ICD-10-CM | POA: Insufficient documentation

## 2014-04-26 DIAGNOSIS — I255 Ischemic cardiomyopathy: Secondary | ICD-10-CM

## 2014-04-26 MED ORDER — ALBUTEROL SULFATE (2.5 MG/3ML) 0.083% IN NEBU
2.5000 mg | INHALATION_SOLUTION | Freq: Once | RESPIRATORY_TRACT | Status: AC
  Administered 2014-04-26: 2.5 mg via RESPIRATORY_TRACT

## 2014-04-26 MED ORDER — FUROSEMIDE 40 MG PO TABS: 40.0000 mg | ORAL_TABLET | Freq: Every day | ORAL | Status: DC

## 2014-04-26 NOTE — Progress Notes (Signed)
Patient ID: Andrea [name], female   DOB: Dec 16, 1951, 63 y.o.   MRN: [id number]   HPI: Andrea HeadingsCorinne G Harr is a 63 y.o. female with a history of CAD DES to prox RHC and stent to dist RCA in 2012, chronic systolic heart failure, (EF 15%) s/p ICD placement, liver failure thougth to be from cardiomyopathy -seen [facility], CVA, CKD, and DM who was admitted on 03/08/14 with N/V and dyspnea. Admitting creatinine was 1.3 but increased to 4.49 and this was thought to be from hypotension and low output heart failure.  She was placed on Milrinone and diuresed with IV lasix. Milrinone was weaned off but restarted due to mixed venous saturation 39%. At that point she was discharged on Milrinone at 0.125 mcg via PICC. Discharge creatinine was 2.0. Discharge weight was 176 pounds.    She was seen in Clinic last week and weight was trending down to 169. Lasix cut back to 40 mg daily. Also emphasized need to take hydralazine 3x/day. She called this past Sunday due to weight gain of 3 pounds. Weight back down.    R/LHC 04/12/14: RA = 10  RV = 60/4/8  PA = 60/30 (42)  PCWP = 25  Fick CO/CI = 3.6/2.1  Thermo CO/CI = 2.9/1.6  PVR = 5.8  SVR = 1,911  Ao sat = 91%  PA sats = 46%, 49% 1. Nonobstructive CAD with patent stent RCA  Follow up: Since last visit had L/RHC showing low CO, increased milrinone to 0.375 and started amiodarone 200 mg PO BID. Denies SOB, PND, CP or orthopnea. Weight stable at home 172-175 lbs. Still fatigued. Following a low salt diet and drinking less than 2L a day.  Follow[facility] by [facility] providing Milrinone and weekly BMET on Wednesdays.    Labs 03/29/14; Cr 1.3 k 4.1 co-ox 76%          04/20/14: K+ 5.3, creatinine 1.15  ECHO 03/11/14 EF 15% RV ok   SH: Lives with partner, Andrea [name] and 63 year old son. She does not drive. Quit smoking 25 years ago. She does not drink alcohol  FH:   Mom Heart Failure          Father MI          Brother Heart Dissase     ROS: All systems negative except as  listed in HPI, PMH and Problem List.  Past Medical History  Diagnosis Date  . Chronic systolic heart failure   . Hypertension   . Stroke 02/2010    left brain CVA? recurrent TIA's treated with TPA  . Dyslipidemia   . Premature ventricular contractions (PVCs) (VPCs)   . GERD (gastroesophageal reflux disease)   . Coronary artery disease     a. DES-dRCA 11/2010. b. DES-pRCA 04/2011 (in an area free of disease on prior cath). c. s/p DES-mRCA 03/2013 for NSTEMI.  . Ischemic cardiomyopathy     a. Hx of medtronic ICD. b. EF 15-20% by cath 03/2013.  . Pulmonary hypertension   . Gout   . Obesity   . Thrombocytopenia   . Depression   . Hypotension     a. Admission 09/2012 for hypotn & ARF. b. Meds held 03/2013 due to hypotension.  . Acute renal insufficiency     a. 09/2012. b. Also noted post-cath 03/2013.  . NSTEMI (non-ST elevated myocardial infarction) 03/2013  . Anginal pain   . Asthma   . Microcytic anemia     a. Noted 03/2013 on labs, iron studies  WNL.  . Anxiety   . CHF (congestive heart failure)   . CKD (chronic kidney disease) stage 3, GFR 30-59 ml/min   . HTN (hypertension)   . Primary pulmonary HTN   . Automatic implantable cardiac defibrillator - Medtronic 08/24/2013  . Heart murmur   . Type II diabetes mellitus   . Migraine headache     "no pain; aura/visual problems only; at least 2-3X/month" (01/18/2014)  . Arthritis     "knees" (01/18/2014)  . DJD (degenerative joint disease)   . Bipolar disorder     Current Outpatient Prescriptions  Medication Sig Dispense Refill  . amiodarone (PACERONE) 200 MG tablet Take 1 tablet (200 mg total) by mouth 2 (two) times daily.  60 tablet  3  . DULoxetine (CYMBALTA) 60 MG capsule Take 60 mg by mouth daily.      . furosemide (LASIX) 40 MG tablet Take 1 tablet (40 mg total) by mouth daily.  60 tablet  6  . gabapentin (NEURONTIN) 100 MG capsule Take 1 capsule (100 mg total) by mouth 2 (two) times daily.  60 capsule  0  . hydrALAZINE  (APRESOLINE) 25 MG tablet Take 1.5 tablets (37.5 mg total) by mouth 3 (three) times daily.  135 tablet  6  . magnesium oxide (MAG-OX) 400 (241.3 MG) MG tablet Take 1 tablet (400 mg total) by mouth 2 (two) times daily.  60 tablet  6  . metoprolol tartrate (LOPRESSOR) 25 MG tablet Take 1 tablet (25 mg total) by mouth 2 (two) times daily.  180 tablet  3  . milrinone (PRIMACOR) 20 MG/100ML SOLN infusion Inject 30.225 mcg/min into the vein continuous.  100 mL    . nitroGLYCERIN (NITROSTAT) 0.4 MG SL tablet Place 0.4 mg under the tongue every 5 (five) minutes as needed for chest pain.      Marland Kitchen omeprazole (PRILOSEC) 20 MG capsule Take 20 mg by mouth daily.       No current facility-administered medications for this encounter.    Filed Vitals:   04/26/14 1340  BP: 124/62  Pulse: 94  Weight: 175 lb 8 oz (79.606 kg)  SpO2: 96%   PHYSICAL EXAM: General:  No acute distress. No resp difficulty. [name] present HEENT: normal Neck: supple. JVP flat. Carotids 2+ bilaterally; no bruits. No lymphadenopathy or thryomegaly appreciated. Cor: PMI normal. Tachycardic Regular rate & rhythm. No rubs, or murmurs. + S3  Lungs: clear Abdomen: soft, nontender, nondistended. No hepatosplenomegaly. No bruits or masses. Good bowel sounds. Extremities: no cyanosis, clubbing, rash, edema RUE 2 lumen PICC Neuro: alert & orientedx3, cranial nerves grossly intact. Moves all 4 extremities w/o difficulty. Affect pleasant   ASSESSMENT & PLAN:  1. Chronic Systolic HF, ICM has ICD. EF 15% RV ok (03/11/14).  - Since last visit had RHC with continue low output and milrinone was increased to 0.375. She has current NYHA II-III symptoms and volume status stable. Will continue lasix 40 mg daily. - She is not on a BB currently with low-output will continue to follow and can consider adding next visit. - Not currently on ACE-I, has history of CRI will check this week with weekly labs and if stable will start low dose ACE-I,  enalapril 2.5 mg daily. - Discussed in depth about LVAD and patient is tolerating milrinone so well right now will continue to hold off. Will discuss at [facility] meeting, not sure with social issues if she will be a good patient. - Reinforced the need and importance of daily weights, a  low sodium diet, and fluid restriction (less than 2 L a day). Instructed to call the HF clinic if weight increases more than 3 lbs overnight or 5 lbs in a week.  2. CKD- Cardiorenal syndrome. Creatinine improved. Will continue to follow and if stable next check start ACE-I 3. CAD S/P PCI with DES to prox RCA and stent to dist RCA in 2012 . No evidence of ischemia.  4. History of liver failure and cirrhosis. Treated at [facility]---> thought to be from cardiomyopathy  Aundria Rud NP-C 2:03 PM

## 2014-04-26 NOTE — Patient Instructions (Signed)
Your physician recommends that you schedule a follow-up appointment in: 3 weeks  

## 2014-04-28 ENCOUNTER — Telehealth (HOSPITAL_COMMUNITY): Payer: Self-pay

## 2014-04-28 ENCOUNTER — Other Ambulatory Visit (HOSPITAL_COMMUNITY): Payer: Self-pay

## 2014-04-28 DIAGNOSIS — I5022 Chronic systolic (congestive) heart failure: Secondary | ICD-10-CM

## 2014-04-28 MED ORDER — MAGNESIUM OXIDE 400 (241.3 MG) MG PO TABS: 400.0000 mg | ORAL_TABLET | Freq: Three times a day (TID) | ORAL | Status: DC

## 2014-04-28 NOTE — Telephone Encounter (Signed)
Patient made aware of lab results, confirmed taking 400 mag ox twice daily. Instructed to increase to three times daily, Rx sent to preferred pharmacy, aware and agreeable.  Mag redraw at upcoming appointment. Ave Filter

## 2014-04-29 LAB — PULMONARY FUNCTION TEST
DL/VA % pred: 98 %
DL/VA: 4.16 ml/min/mmHg/L
DLCO unc % pred: 59 %
DLCO unc: 11.24 ml/min/mmHg
FEF 25-75 Post: 1.21 L/sec
FEF 25-75 Pre: 1.25 L/sec
FEF2575-%Change-Post: -3 %
FEF2575-%Pred-Post: 71 %
FEF2575-%Pred-Pre: 74 %
FEV1-%Change-Post: -1 %
FEV1-%Pred-Post: 82 %
FEV1-%Pred-Pre: 84 %
FEV1-Post: 1.38 L
FEV1-Pre: 1.41 L
FEV1FVC-%Change-Post: -8 %
FEV1FVC-%Pred-Pre: 98 %
FEV6-%Change-Post: 5 %
FEV6-%Pred-Post: 91 %
FEV6-%Pred-Pre: 86 %
FEV6-Post: 1.89 L
FEV6-Pre: 1.79 L
FEV6FVC-%Change-Post: 0 %
FEV6FVC-%Pred-Post: 102 %
FEV6FVC-%Pred-Pre: 103 %
FVC-%Change-Post: 6 %
FVC-%Pred-Post: 89 %
FVC-%Pred-Pre: 83 %
FVC-Post: 1.93 L
FVC-Pre: 1.8 L
Post FEV1/FVC ratio: 72 %
Post FEV6/FVC ratio: 98 %
Pre FEV1/FVC ratio: 78 %
Pre FEV6/FVC Ratio: 99 %
RV % pred: 93 %
RV: 1.72 L
TLC % pred: 80 %
TLC: 3.57 L

## 2014-04-30 ENCOUNTER — Other Ambulatory Visit: Payer: Self-pay | Admitting: Gastroenterology

## 2014-04-30 DIAGNOSIS — Z139 Encounter for screening, unspecified: Secondary | ICD-10-CM

## 2014-05-03 ENCOUNTER — Encounter: Payer: Self-pay | Admitting: Licensed Clinical Social Worker

## 2014-05-03 NOTE — Progress Notes (Signed)
[facility] CLINICAL SOCIAL WORK DOCUMENTATION LVAD (Left Ventricular Assist Device) Psychosocial Screening Please remember that all information is confidential within the members of the VAD team and [facility]  04/26/2014 12:15:18 PM  Patient:  Andrea [name]  MRN:  [id number]  Account:  [id number]  Clinical Social Worker:  Merryl Hacker, LCSW Date/Time Initiated:   04/26/2014 12:15 PM Referral Source:    Hessie Diener, VAD Coordinator  Referral Reason:   LVAD Implantation  Source of Information:   Patient and Andrea [name] (partner)   PATIENT DEMOGRAPHICS NAME:   Andrea [name]     DOB:  05/15/1951  SS#:  [id number] Address:   [address]  [place] [address] Home Phone:    Cell Phone:   [phone] Marital Status:   single- has significant other for many years-Andrea [name]     Primary Language:  ENGLISH  Faith:  NON-DENOMINATIONAL Adherence with Medical Regimen:   Patient states compliance  Medication Adherence:   Patient states compliance  Physician appointment attendance:   Compliant   Do you have a Living Will or Medical POA?  Y Would you like to complete a Living Will and Medical POA prior to surgery?  N Are you currently a DNR?  N Do you have a MOST form?  N Would you like to review one?  N Do you have goals of care?  Y   Have you had a consult with the palliative care team at [facility]?  N Comment:   Psychological Health Appearance:   Patient was dressed appropriate and well groomed during interview in the clinic.  Mental status:   alert and oriented  Eye Contact:   fair- would often look to partner for reassurance  Thought Content:   clear and verbalized thoughts appropriately  Speech:   soft but clear  Mood:   Patient appeared in good spirits.  Affect:   Patient appeared responsive and appropriate  Insight:   Patient was realistic about procedure and asked appropriate questions regarding life  afterwards.  Judgment:   sound  Interaction Style:   Patient was realistic about procedure and asked appropriate questions regarding life afterwards.   Family/Social Information Who lives in your home? Name9  Andrea [name]  Andrea [name]   Age9  [phone]  63yo   Relationship to you9  significant other  son    Do you have a plan for child care if relevant?   Andrea [name] will manage child care needs as she currently provides care.  List family members outside the home (parents, friends, pastor, etc..) Andrea [name]   Age2  39yo  younger brother   Relationship to Andrea [name]  daughter  brother    Please list people who give you emotional support (family, parents, friends, pastor, etc..) Name3  Andrea [name]    Relationship to [phone]  pastor    Who is your primary and backup support pre and post-surgery? Explain the relationships i.e. strengths/weakness, etc.:   Andrea [name]- significant other- very supportive of patient- primary  Secondary Caregiver identified:   Andrea [name]- 14yo son- back up  [place]- daughter- back up- she will stay with patient for a week after primary caregiver returns to work.   Legal Do you currently have any legal issues/problems?   no  Durable POA or Legal Next of Kin:   Andrea [name]  [phone]   Living situation Travel distance to [facility]:  15-20 minutes  Second Hand Smoke Exposure:  Andrea [name] reports she smokes in the home  "but not around her".  Self- Care level:   Patient reports she is independent with ADL's.  Ambulation:   She reports she uses a walker at times although did not use during clinic appointment.  Transportation:   Andrea [name] drives to all [place].  Limitations:   Patient denies any limitations.  Barriers impacting ability to participate in care:   Patient reports that she is currently on milrinone and the "cord always gets in the way".   Community Are you active with  community agencies/resources?   none  Are you active in a church, Conservation officer, nature, mosque, or other faith based community?   [employer]  What other sources do you have for spiritual support?   Andrea [name]  Are you active in any clubs or social organizations?   none  What do you do for fun? hobbies, interests?  watch TV   Education/Work information What is the last grade of school you completed?  High School and some college  Preferred method of learning? (Written, verbal, hands on):   written and hands on  Do you have any problems with reading or writing?  none  Are you currently employed? If no, when were you last employed?   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   n/a  If yes, what type of employment do you hope to find?   n/a  Are you interested in job training or learning new skills?   n/a  Did you serve in the Military? If so, what branch?   no   Financial Information What is your source of income?    pension and Social Security  Do you have difficulty meeting your monthly expenses?   no  If yes, which ones?    n/a  How do you usually cope with this?    n/a  Primary Health Insurance:    [employer]  Secondary Insurance:    none  Have you applied for Medicaid?    no  Have you applied for Social Security Disability?    no  Do you have prescription coverage?    [employer]  What are your prescription co-pays?    varies $1-$50  Are you required to use a certain pharmacy?    no- use [facility]  Do you have a mail order option for your prescriptions?    yes  If yes, what pharmacy do you use for mail order?    [facility]  Have you ever refused medication due to cost?    no  Discuss monthly cost for dressing supplies post  procedure $150-300    Discussed and Reviewed  Can you budget for this monthly expense?    yes   Medical Information Briefly describe your medical history, surgeries and why you are here for evaluation.    Patient reports she had an AICD placed in 2008. She was hospitalized with CHF in 2011 because "I couldn't breathe". In 2012, she reports she had an MI and in 2013 had a TIA along with 3 stents placed. She continued with CHF and started home milrinone in March of 2015.  Are you able to complete your ADL's?  Patient reports she is independent.  Do you have any history of emotional, medical, physical or verbal trauma?    Patient reports a fall in 1993.  Do you have any family history of heart problems?    Mother and Father  Do you smoke? If so, what is the amount and frequency?    no  Do you drink alcohol? If so, how many drinks a day/week?    no  Have you ever used illegal drugs or misused medications?    no  If yes, what drugs do you use and how often?    n/a  Have you ever been treated for substance abuse?    no  If yes, where and when were you treated?    n/a  Are you currently using illegal substances?    no   Mental Health History Have you ever had problems with depression, anxiety or other mental health issues?   Patient reports she was diagnosed with depression in 2010 and began an anti-depressant.  If yes, have you seen a counselor, psychiatrist or therapist?    She attened counseling weekly for 8 months althjough no longer actively seeing any mental health professional.  If you are currently experiencing problems are you interested in talking with a professional?    denies  Would you be interested in participating in an LVAD support group?  Y LVAD support group for: Patient Caregivers patient Have you or are you taking any medications for anxiety/depression or any mental health concerns?    Currently taking Cymbalta prescribed by her PCP Dr. Renato Gails  If yes, Please list  the medications?    Cymbalta  How have you been feeling in the past year?    tired and depressed  How do you handle stressful situations?   "depends", "ignore it", or "try to handle it"  What are your coping strategies? Please List:    "Talk to [facility] or call my brother"  Have you had any past or current thoughts of suicide?    no  How many hours do you sleep at night?    4 hrs. Patient states she has "sleep issues".  How is your appetite?   "here and there"  PHQ2- Depression Screen:    5  PHQ9 Depression screen (only complete if the PHQ2 is positive):    12   Plan for VAD Implementation Do you know and understand what happens during VAD surgery?  Please describe your thoughts:   Patient verblized understanding of procedure.  What do you know about the risks of any major surgery or use of general anesthesia?    Patient verbalized understanding of risk factors, death, stroke and infection.  What do you know about the risks and side effects associated with VAD surgery?    Patient's understanding of risks was accurate amd appropriate.  Explain what will happen right after surgery?    Patient understands transfer to ICU and will be intubated. Once stable, will be transferred to the floor and eventually d/c home with 24/7  caregiver.  Information obtained from:    Patient and Andrea [name] (primary caregiver/SO)  What is your plan for transportation for the first 8 weeks post-surgery? (Patients are not recommended to drive post-surgery for 8 weeks)    Patient reports that Andrea [name] will provide all transport needs.  Driver: Andrea [name]- SO  Valid license:    yes  Working Administrator, sports:    [employer]an Rogue 2014  Airbags:  yes Do you plan to disarm the  airbags- there is a risk of discharging the device if the airbag were to deploy.   Reviewed and discussed and patient and SO are undecided at this time. Patient consdiering travelling in the back seat.  What do you know about  your diet post-surgery?    Heart Healthy  How do you plan to monitor your medications, current and future?    pill box  How do you plan to complete ADL's post-surgery (Shower, dress, etc.)?    Patient has very supportive caregiver Andrea HensenBernadette who will assist with care needs.  Will it be difficult to ask for help for your caregivers? If so, explain:    Patient denies any concerns  Please explain what you hope will be improved about your life as a result of receiving the VAD    Patient reports she is hopeful the VAD will "prolong my life".  Please tell me your biggest concern or fear about receiving the VAD?    "dying on the table"  How do you cope with your concerns/fears?    "pray"  Please explain your understanding of how your body will change? Are you worried about these changes?    Patient states that she will feel like the "million dollar woman".  Do you see any barriers to your surgery or follow up? If yes, please explain:   Patient reports that she is concerned that her care needs will be a strain on [place].     Understanding of LVAD Surgical procedures and risks:    discussed and reviewed  Electrical need for LVAD:    Patient confirms 3 prong outlets and need for electrical.  Safety precautions with LVAD (Water, etc.):   Discussed and reviewed. Patient understands water safety.  Potential side effects with LVAD:    Discussed and Reviewed  Types of Advanced heart failure therapies available:    Discussed and reviewed. Additional information provided by the VAD Coordinator  LVAD daily self-care (dressing changes, computer check, extra supplies):    Discussed and Reviewed  Outpatient follow-up (follow-up in LVAD clinic; monitoring blood thinners)    Discussed and Reviewed  Need for emergency planning:    Patient verbalizes understanding of need for emergency planning and explore options and develop a plan.  Expectations for LVAD:    Patient appears to have understanding of  LVAD although shared concerns about "weight of the equipment" and carrying it around.  Current level of motivation to prepare for LVAD:    Patient stated that she is interested in pursuing VAD placement although with some hesitation in her voice. Patient's SO Andrea [name] verbalized hesitation stating that she is not sure about this and "if it were me I would rather die than go through all of this".  Patient's perception of need for LVAD:    Patient seemed unsure of the severity of her heart failure and whether or not LVAD was an emergent need.  Present level of consent for LVAD:    Patient states she would like to proceed with evaluations and LVAD implant.  Reasons for seeking LVAD:    Patient states that "I can't live on milrinone the rest of my life".   Psychosocial Protective Factors  Ms. Bewick appears to have a good support system with her SO Andrea [name]. She has a 14yo son who will be back up caregiver and good support from her daughter and brother although both live a distance. She reports good compliance with her medical regimen and has a valid Living Will and Healthcare  POA. She and her partner have discussed goals of care and are open to discuss further with Palliative Care team. She has Living Will and HPOA. She reports she has stable income and denies any concerns with household finances. She has good health insurance and prescription coverage. She reports she is independent with ADL's and has good support from [place] if needed. She denies any substance abuse/ illegal drug use and no history of trauma. She denies any legal issues and will pursue a durable POA. She appears to be realistic about surgery and understands the risks and benefits of surgery. Psychosocial Risk Factors  Patient scored a 5 on the PHQ2 and a 12 on the PHQ9 indicating that she has depression. Patient admits loss of interest, decreased energy, and overall feelings of depression. She is currently on cymbalta but  reports she is not receiving and mental health services.  She reports that she handles stress by "trying to ignore it or sleep". She also reported sleep issues and her appetite is "here and there". Patient's primary caregiver Andrea [name] was tearful during interview and unsure and hesitant about patient's ability to handle procedure and herself managing the caregiving responsibilities.  Clinical Intervention: CSW provided supportive intervention and encouraged both to share thoughts and concerns regarding procedure. CSW offered availability for further discussions.   Educated patient/family on the following Caregiver(s) role responsibilities:   Discussed and Reviewed  Financial planning for LVAD:    Discussed and Reviewed  Role of Clinical Social Worker:    Discussed and will continue to provide supportive intervention as needed.  Signs of depression and anxiety:    Patient scored a 5 on the PHQ-2 and a 12 on the PHQ-9. Patient denies any suicidal thoughts but admits to loss of interest in doing things, decreased energy and feelings of depression. Discussed coping strategies and support. Patient and caregiver verbalize understanding.  Support planning for LVAD:    Discussed and reviewed  LVAD process:    Discussed and Reviewed. Most information provided by VAD Coordinator  Caregiver contract/agreement:   Contract provided by VAD Coordinator  Discussed Referral(s) to:    none at this time  [facility]:    Discussed options but none noted at this time  Clinical Impression Recommendations:    Ms. Shimkus appears to have interest in obtaining a VAD although somewhat hesitant as her caregiver does not appear to be ready at this time for VAD implantation. Patient also has significant depression which would need further assessment/support prior to VAD placement. CSW would recommend further discussions with VAD team regarding VAD implantation procedure and life living with a VAD. Andrea Beech, LCSW [phone]

## 2014-05-04 ENCOUNTER — Encounter: Payer: Self-pay | Admitting: Internal Medicine

## 2014-05-07 ENCOUNTER — Telehealth (HOSPITAL_COMMUNITY): Payer: Self-pay | Admitting: *Deleted

## 2014-05-07 NOTE — Telephone Encounter (Signed)
Pt called stating she saw Dr. Darrick Penna, nephrologist and was told to stop her Hydralazine 37.5 mg tid; she would like to ask her heart failure team if this is safe to do. States her BP in his office was 90/46 and that labs were drawn, but she doesn't know results.   Pt reports BP this morning was 130/90 before meds and 126/84 thirty mins after meds.  She denies any dizziness, lightheadedness, or syncopal episodes.  Instructed pt per Ulla Potash, NP to decrease Hydralazine to 25 mg three times daily. Pt has f/u appt with CHF clinic 05/17/14. Also instructed pt to call CHF clinic at [phone] for any symptoms of heart failure or SBP < 90. Pt verbalized understanding of same.

## 2014-05-17 ENCOUNTER — Ambulatory Visit (HOSPITAL_COMMUNITY)
Admission: RE | Admit: 2014-05-17 | Discharge: 2014-05-17 | Disposition: A | Discharge status: Home / Self Care | Payer: BC Managed Care – PPO | Source: Ambulatory Visit | Attending: Internal Medicine | Admitting: Internal Medicine

## 2014-05-17 ENCOUNTER — Ambulatory Visit
Admission: RE | Admit: 2014-05-17 | Discharge: 2014-05-17 | Disposition: A | Discharge status: Home / Self Care | Payer: BC Managed Care – PPO | Source: Ambulatory Visit | Attending: Gastroenterology | Admitting: Gastroenterology

## 2014-05-17 ENCOUNTER — Other Ambulatory Visit: Payer: Self-pay | Admitting: Gastroenterology

## 2014-05-17 VITALS — BP 138/80 | HR 96 | Wt 172.5 lb

## 2014-05-17 DIAGNOSIS — Z139 Encounter for screening, unspecified: Secondary | ICD-10-CM

## 2014-05-17 DIAGNOSIS — N183 Chronic kidney disease, stage 3 unspecified: Secondary | ICD-10-CM | POA: Insufficient documentation

## 2014-05-17 DIAGNOSIS — G43909 Migraine, unspecified, not intractable, without status migrainosus: Secondary | ICD-10-CM | POA: Insufficient documentation

## 2014-05-17 DIAGNOSIS — N189 Chronic kidney disease, unspecified: Secondary | ICD-10-CM

## 2014-05-17 DIAGNOSIS — F319 Bipolar disorder, unspecified: Secondary | ICD-10-CM | POA: Insufficient documentation

## 2014-05-17 DIAGNOSIS — I509 Heart failure, unspecified: Secondary | ICD-10-CM

## 2014-05-17 DIAGNOSIS — I2510 Atherosclerotic heart disease of native coronary artery without angina pectoris: Secondary | ICD-10-CM

## 2014-05-17 DIAGNOSIS — Z9861 Coronary angioplasty status: Secondary | ICD-10-CM | POA: Insufficient documentation

## 2014-05-17 DIAGNOSIS — I5022 Chronic systolic (congestive) heart failure: Secondary | ICD-10-CM

## 2014-05-17 DIAGNOSIS — E119 Type 2 diabetes mellitus without complications: Secondary | ICD-10-CM | POA: Insufficient documentation

## 2014-05-17 DIAGNOSIS — M1990 Unspecified osteoarthritis, unspecified site: Secondary | ICD-10-CM | POA: Insufficient documentation

## 2014-05-17 DIAGNOSIS — I498 Other specified cardiac arrhythmias: Secondary | ICD-10-CM | POA: Insufficient documentation

## 2014-05-17 DIAGNOSIS — Z95810 Presence of automatic (implantable) cardiac defibrillator: Secondary | ICD-10-CM | POA: Insufficient documentation

## 2014-05-17 DIAGNOSIS — I130 Hypertensive heart and chronic kidney disease with heart failure and stage 1 through stage 4 chronic kidney disease, or unspecified chronic kidney disease: Secondary | ICD-10-CM | POA: Insufficient documentation

## 2014-05-17 DIAGNOSIS — K219 Gastro-esophageal reflux disease without esophagitis: Secondary | ICD-10-CM | POA: Insufficient documentation

## 2014-05-17 DIAGNOSIS — Z87891 Personal history of nicotine dependence: Secondary | ICD-10-CM | POA: Insufficient documentation

## 2014-05-17 DIAGNOSIS — Z8673 Personal history of transient ischemic attack (TIA), and cerebral infarction without residual deficits: Secondary | ICD-10-CM | POA: Insufficient documentation

## 2014-05-17 DIAGNOSIS — I2589 Other forms of chronic ischemic heart disease: Secondary | ICD-10-CM | POA: Insufficient documentation

## 2014-05-17 DIAGNOSIS — E785 Hyperlipidemia, unspecified: Secondary | ICD-10-CM | POA: Insufficient documentation

## 2014-05-17 DIAGNOSIS — E669 Obesity, unspecified: Secondary | ICD-10-CM | POA: Insufficient documentation

## 2014-05-17 MED ORDER — HYDRALAZINE HCL 25 MG PO TABS: 37.5000 mg | ORAL_TABLET | Freq: Three times a day (TID) | ORAL | Status: DC

## 2014-05-17 MED ORDER — AMIODARONE HCL 200 MG PO TABS: 200.0000 mg | ORAL_TABLET | Freq: Every day | ORAL | Status: DC

## 2014-05-17 NOTE — Progress Notes (Signed)
Patient ID: Andrea [name], female   DOB: [birth year], 63 y.o.   MRN: [id number]   HPI: Andrea [name] is a 63 y.o. female with a history of CAD DES to prox RHC and stent to dist RCA in 2012, chronic systolic heart failure, (EF 15%) s/p ICD placement, liver failure thougth to be from cardiomyopathy -seen at [facility], CVA, CKD, and DM who was admitted on 03/08/14 with N/V and dyspnea. Admitting creatinine was 1.3 but increased to 4.49 and this was thought to be from hypotension and low output heart failure.  She was placed on Milrinone and diuresed with IV lasix. Milrinone was weaned off but restarted due to mixed venous saturation 39%. At that point she was discharged on Milrinone at 0.125 mcg via PICC. Discharge creatinine was 2.0. Discharge weight was 176 pounds.     R/LHC 04/12/14: RA = 10  RV = 60/4/8  PA = 60/30 (42)  PCWP = 25  Fick CO/CI = 3.6/2.1  Thermo CO/CI = 2.9/1.6  PVR = 5.8  SVR = 1,911  Ao sat = 91%  PA sats = 46%, 49% 1. Nonobstructive CAD with patent stent RCA  Follow up:  Milrinone increased to 0.375 after RHC in April. Now walking around mall. Denies SOB, PND, CP or orthopnea. Weight stable at home 172-175 lbs. Occasionally takes extra furosemide. Following a low salt diet and drinking less than 2L a day.  Followed by [facility] providing Milrinone and weekly BMET on Wednesdays.  They have decided to defer VAD at this point. Was on hydralazine 37.5 tid. However went to see Dr. Darrick Penna and SBP around 90 so it was cut back to 25 tid. Now SBP running closer to 140. Has been on amio due to frequent SVT seen in cath lab. Imdur stopped due to severe HAs.  Labs 03/29/14; Cr 1.3 k 4.1 co-ox 76%          04/20/14: K+ 5.3, creatinine 1.15          04/26/14: K 5.1 Cr 1.01  ECHO 03/11/14 EF 15% RV ok   SH: Lives with partner, Andrea [name] and 46 year old son. She does not drive. Quit smoking 25 years ago. She does not drink alcohol  FH:   Mom Heart Failure          Father MI  Brother Heart Dissase     ROS: All systems negative except as listed in HPI, PMH and Problem List.  Past Medical History  Diagnosis Date  . Chronic systolic heart failure   . Hypertension   . Stroke 02/2010    left brain CVA? recurrent TIA's treated with TPA  . Dyslipidemia   . Premature ventricular contractions (PVCs) (VPCs)   . GERD (gastroesophageal reflux disease)   . Coronary artery disease     a. DES-dRCA 11/2010. b. DES-pRCA 04/2011 (in an area free of disease on prior cath). c. s/p DES-mRCA 03/2013 for NSTEMI.  . Ischemic cardiomyopathy     a. Hx of medtronic ICD. b. EF 15-20% by cath 03/2013.  . Pulmonary hypertension   . Gout   . Obesity   . Thrombocytopenia   . Depression   . Hypotension     a. Admission 09/2012 for hypotn & ARF. b. Meds held 03/2013 due to hypotension.  . Acute renal insufficiency     a. 09/2012. b. Also noted post-cath 03/2013.  . NSTEMI (non-ST elevated myocardial infarction) 03/2013  . Anginal pain   . Asthma   . Microcytic anemia  a. Noted 03/2013 on labs, iron studies WNL.  Marland Kitchen Anxiety   . CHF (congestive heart failure)   . CKD (chronic kidney disease) stage 3, GFR 30-59 ml/min   . HTN (hypertension)   . Primary pulmonary HTN   . Automatic implantable cardiac defibrillator - Medtronic 08/24/2013  . Heart murmur   . Type II diabetes mellitus   . Migraine headache     "no pain; aura/visual problems only; at least 2-3X/month" (01/18/2014)  . Arthritis     "knees" (01/18/2014)  . DJD (degenerative joint disease)   . Bipolar disorder     Current Outpatient Prescriptions  Medication Sig Dispense Refill  . amiodarone (PACERONE) 200 MG tablet Take 1 tablet (200 mg total) by mouth 2 (two) times daily.  60 tablet  3  . DULoxetine (CYMBALTA) 60 MG capsule Take 60 mg by mouth daily.      . furosemide (LASIX) 40 MG tablet Take 1 tablet (40 mg total) by mouth daily.  30 tablet  6  . gabapentin (NEURONTIN) 100 MG capsule Take 1 capsule (100 mg total) by  mouth 2 (two) times daily.  60 capsule  0  . hydrALAZINE (APRESOLINE) 25 MG tablet Take 25 mg by mouth 3 (three) times daily.      . magnesium oxide (MAG-OX) 400 (241.3 MG) MG tablet Take 1 tablet (400 mg total) by mouth 3 (three) times daily.  60 tablet  6  . metoprolol tartrate (LOPRESSOR) 25 MG tablet Take 1 tablet (25 mg total) by mouth 2 (two) times daily.  180 tablet  3  . milrinone (PRIMACOR) 20 MG/100ML SOLN infusion Inject 30.225 mcg/min into the vein continuous.  100 mL    . nitroGLYCERIN (NITROSTAT) 0.4 MG SL tablet Place 0.4 mg under the tongue every 5 (five) minutes as needed for chest pain.      Marland Kitchen omeprazole (PRILOSEC) 20 MG capsule Take 20 mg by mouth daily.       No current facility-administered medications for this encounter.    Filed Vitals:   05/17/14 1031  BP: 138/80  Pulse: 96  Weight: 172 lb 8 oz (78.245 kg)  SpO2: 95%   PHYSICAL EXAM: General:  No acute distress. No resp difficulty. Andrea [name] present HEENT: normal Neck: supple. JVP flat. Carotids 2+ bilaterally; no bruits. No lymphadenopathy or thryomegaly appreciated. Cor: PMI normal.  Regular rate & rhythm. No rubs, or murmurs. No S3  Lungs: clear Abdomen: soft, nontender, nondistended. No hepatosplenomegaly. No bruits or masses. Good bowel sounds. Extremities: no cyanosis, clubbing, rash, edema RUE 2 lumen PICC Neuro: alert & orientedx3, cranial nerves grossly intact. Moves all 4 extremities w/o difficulty. Affect pleasant   ASSESSMENT & PLAN:  1. Chronic Systolic HF, ICM has ICD. EF 15% RV ok (03/11/14).  - Overall much improved on higher dose of milrinone. Now NYHA II-III. Volume status looks good. Doing well with sliding-scale diuretics. - BP back up. Will try to increase hydralazine back to 37.5 tid. She will follow BP at home. If SBP < 100 can cut back to 25 tid. Unable to tolerate IMDUR due to severe HAs in setting of known migraines.  - Will not add ACE-I at this point given hyperkalemia. Tolerating  low-dose b-blocker will continue.  - Not interested in proceeding with VAD at this point.  - Repeat echo in 1-2 months 2. CKD- Cardiorenal syndrome. Creatinine improved.  3. CAD S/P PCI with DES to prox RCA and stent to distal RCA in 2012 . No evidence of  ischemia.  4. History of liver failure and cirrhosis. Treated at [facility]---> thought to be from cardiomyopathy 5. SVT - will decrease amio to 200 daily. Check LFTS/TFTs in 4 weeks.   F/u in 4 weeks.  Dolores Pattyaniel R Bensimhon MD 10:49 AM

## 2014-05-17 NOTE — Patient Instructions (Signed)
Decrease Amiodarone to 200 mg daily  Increase Hydralazine to 37.5 mg (1 & 1/2 tabs) Three times a day   Your physician recommends that you schedule a follow-up appointment in: 1 month

## 2014-05-17 NOTE — Addendum Note (Signed)
Encounter addended by: Noralee Space, RN on: 05/17/2014 11:15 AM<BR>     Documentation filed: Patient Instructions Section, Orders

## 2014-05-25 ENCOUNTER — Other Ambulatory Visit (HOSPITAL_COMMUNITY): Payer: Self-pay | Admitting: Cardiology

## 2014-05-25 DIAGNOSIS — I6529 Occlusion and stenosis of unspecified carotid artery: Secondary | ICD-10-CM

## 2014-05-27 ENCOUNTER — Encounter: Payer: Self-pay | Admitting: Internal Medicine

## 2014-05-28 ENCOUNTER — Telehealth (HOSPITAL_COMMUNITY): Payer: Self-pay | Admitting: Cardiology

## 2014-05-28 NOTE — Telephone Encounter (Signed)
Patty with [facility] called to report abnormal lab results for pt Potassium 7, however specimen has been sitting in transport box for extended period of time Blood more than likely was hemolyzed  Spoke with Mrs. Ellyson and she is unable to come into office to have labs redrawn VO given to Patty,RN to have labs redrawn today- fax results to office

## 2014-05-31 ENCOUNTER — Encounter (HOSPITAL_COMMUNITY): Payer: BC Managed Care – PPO

## 2014-06-01 ENCOUNTER — Encounter: Payer: Self-pay | Admitting: Internal Medicine

## 2014-06-02 ENCOUNTER — Telehealth (HOSPITAL_COMMUNITY): Payer: Self-pay | Admitting: Cardiology

## 2014-06-02 NOTE — Telephone Encounter (Signed)
Spoke w/pt wt was 173.2 today, yesterday it was 169, pt denies SOB or edema, she states she went ahead and took an extra 40 mg of Lasix today, advised pt to call us back if wt does not come back down, she is agreeable

## 2014-06-02 NOTE — Telephone Encounter (Signed)
Pt called with concerns of 4 lb weight gain overnight Denies sob, or swelling Does admit dizziness  Please advise

## 2014-06-07 ENCOUNTER — Ambulatory Visit
Admission: RE | Admit: 2014-06-07 | Discharge: 2014-06-07 | Disposition: A | Discharge status: Home / Self Care | Payer: BC Managed Care – PPO | Source: Ambulatory Visit | Attending: Gastroenterology | Admitting: Gastroenterology

## 2014-06-07 DIAGNOSIS — Z139 Encounter for screening, unspecified: Secondary | ICD-10-CM

## 2014-06-09 ENCOUNTER — Telehealth (HOSPITAL_COMMUNITY): Payer: Self-pay

## 2014-06-09 NOTE — Telephone Encounter (Signed)
Left message for patient to return call to CHF clinic.  Needs to hold furosemide for 2 days, then resume as prescribed.  Will recheck bmet when she comes in on 6/15.  Will attempt to recall. Ave Filter

## 2014-06-10 ENCOUNTER — Telehealth (HOSPITAL_COMMUNITY): Payer: Self-pay | Admitting: Surgery

## 2014-06-10 NOTE — Telephone Encounter (Signed)
Patient informed to hold Lasix for 2 days and that the labs will be rechecked on 6/15 at next appointment.

## 2014-06-14 ENCOUNTER — Ambulatory Visit (HOSPITAL_COMMUNITY)
Admission: RE | Admit: 2014-06-14 | Discharge: 2014-06-14 | Disposition: A | Discharge status: Home / Self Care | Payer: BC Managed Care – PPO | Source: Ambulatory Visit | Attending: Internal Medicine | Admitting: Internal Medicine

## 2014-06-14 ENCOUNTER — Encounter: Payer: Self-pay | Admitting: Anesthesiology

## 2014-06-14 ENCOUNTER — Encounter (HOSPITAL_COMMUNITY): Payer: BC Managed Care – PPO

## 2014-06-14 ENCOUNTER — Encounter (HOSPITAL_COMMUNITY): Payer: Self-pay

## 2014-06-14 VITALS — BP 130/71 | HR 84 | Resp 18 | Wt 174.1 lb

## 2014-06-14 DIAGNOSIS — IMO0002 Reserved for concepts with insufficient information to code with codable children: Secondary | ICD-10-CM

## 2014-06-14 DIAGNOSIS — Z79899 Other long term (current) drug therapy: Secondary | ICD-10-CM | POA: Insufficient documentation

## 2014-06-14 DIAGNOSIS — Z95810 Presence of automatic (implantable) cardiac defibrillator: Secondary | ICD-10-CM | POA: Insufficient documentation

## 2014-06-14 DIAGNOSIS — M1990 Unspecified osteoarthritis, unspecified site: Secondary | ICD-10-CM | POA: Insufficient documentation

## 2014-06-14 DIAGNOSIS — I509 Heart failure, unspecified: Secondary | ICD-10-CM

## 2014-06-14 DIAGNOSIS — I2589 Other forms of chronic ischemic heart disease: Secondary | ICD-10-CM | POA: Insufficient documentation

## 2014-06-14 DIAGNOSIS — Z09 Encounter for follow-up examination after completed treatment for conditions other than malignant neoplasm: Secondary | ICD-10-CM | POA: Insufficient documentation

## 2014-06-14 DIAGNOSIS — I5022 Chronic systolic (congestive) heart failure: Secondary | ICD-10-CM | POA: Insufficient documentation

## 2014-06-14 DIAGNOSIS — N183 Chronic kidney disease, stage 3 unspecified: Secondary | ICD-10-CM | POA: Insufficient documentation

## 2014-06-14 DIAGNOSIS — K219 Gastro-esophageal reflux disease without esophagitis: Secondary | ICD-10-CM | POA: Insufficient documentation

## 2014-06-14 DIAGNOSIS — I2510 Atherosclerotic heart disease of native coronary artery without angina pectoris: Secondary | ICD-10-CM | POA: Insufficient documentation

## 2014-06-14 DIAGNOSIS — M109 Gout, unspecified: Secondary | ICD-10-CM | POA: Insufficient documentation

## 2014-06-14 DIAGNOSIS — I951 Orthostatic hypotension: Secondary | ICD-10-CM

## 2014-06-14 DIAGNOSIS — I2789 Other specified pulmonary heart diseases: Secondary | ICD-10-CM | POA: Insufficient documentation

## 2014-06-14 DIAGNOSIS — I498 Other specified cardiac arrhythmias: Secondary | ICD-10-CM | POA: Insufficient documentation

## 2014-06-14 DIAGNOSIS — E669 Obesity, unspecified: Secondary | ICD-10-CM | POA: Insufficient documentation

## 2014-06-14 DIAGNOSIS — E119 Type 2 diabetes mellitus without complications: Secondary | ICD-10-CM | POA: Insufficient documentation

## 2014-06-14 DIAGNOSIS — F319 Bipolar disorder, unspecified: Secondary | ICD-10-CM | POA: Insufficient documentation

## 2014-06-14 DIAGNOSIS — D696 Thrombocytopenia, unspecified: Secondary | ICD-10-CM | POA: Insufficient documentation

## 2014-06-14 DIAGNOSIS — E785 Hyperlipidemia, unspecified: Secondary | ICD-10-CM | POA: Insufficient documentation

## 2014-06-14 DIAGNOSIS — I259 Chronic ischemic heart disease, unspecified: Secondary | ICD-10-CM

## 2014-06-14 DIAGNOSIS — M1710 Unilateral primary osteoarthritis, unspecified knee: Secondary | ICD-10-CM | POA: Insufficient documentation

## 2014-06-14 DIAGNOSIS — Z8673 Personal history of transient ischemic attack (TIA), and cerebral infarction without residual deficits: Secondary | ICD-10-CM | POA: Insufficient documentation

## 2014-06-14 DIAGNOSIS — R42 Dizziness and giddiness: Secondary | ICD-10-CM

## 2014-06-14 DIAGNOSIS — I129 Hypertensive chronic kidney disease with stage 1 through stage 4 chronic kidney disease, or unspecified chronic kidney disease: Secondary | ICD-10-CM | POA: Insufficient documentation

## 2014-06-14 MED ORDER — METOPROLOL TARTRATE 25 MG PO TABS: 12.5000 mg | ORAL_TABLET | Freq: Two times a day (BID) | ORAL | Status: DC

## 2014-06-14 MED ORDER — HYDRALAZINE HCL 25 MG PO TABS: 37.5000 mg | ORAL_TABLET | Freq: Two times a day (BID) | ORAL | Status: DC

## 2014-06-14 MED ORDER — FUROSEMIDE 40 MG PO TABS: 40.0000 mg | ORAL_TABLET | Freq: Every day | ORAL | Status: DC

## 2014-06-14 NOTE — Patient Instructions (Signed)
Decrease your lopressor to 12.5 mg (1/2 tablet) twice a day.  Continue hydralazine 37.5 mg (1 1/2 tablets) twice a day.  Follow up 1 month with MD.  Do the following things EVERYDAY: 1) Weigh yourself in the morning before breakfast. Write it down and keep it in a log. 2) Take your medicines as prescribed 3) Eat low salt foods-Limit salt (sodium) to 2000 mg per day.  4) Stay as active as you can everyday 5) Limit all fluids for the day to less than 2 liters 6)

## 2014-06-14 NOTE — Progress Notes (Signed)
Patient ID: TAYLEE YEW, female   DOB: [birth year], 63 y.o.   MRN: [id number]  PCP: Dr. Elias Else  [facility])  HPI: Andrea [name] is a 63 y.o. female with a history of CAD DES to prox RHC and stent to dist RCA in 2012, chronic systolic heart failure, ICM s/p ICD placement, liver failure thougth to be from cardiomyopathy -seen at [facility], CVA, CKD, and DM who was admitted on 03/08/14 with N/V and dyspnea. Admitting creatinine was 1.3 but increased to 4.49 and this was thought to be from hypotension and low output heart failure.  She was placed on Milrinone and diuresed with IV lasix. Milrinone was weaned off but restarted due to mixed venous saturation 39%. At that point she was discharged on Milrinone at 0.125 mcg via PICC. Discharge creatinine was 2.0. Discharge weight was 176 pounds.    R/LHC 04/12/14: RA = 10  RV = 60/4/8  PA = 60/30 (42)  PCWP = 25  Fick CO/CI = 3.6/2.1  Thermo CO/CI = 2.9/1.6  PVR = 5.8  SVR = 1,911  Ao sat = 91%  PA sats = 46%, 49% 1. Nonobstructive CAD with patent stent RCA  Follow up for Heart Failure: Last visit increased hydralazine to 37.5 mg TID. She sleeps a lot and can't remember to take TID so takes BID. Cr was elevated on last labs and was told her lasix for 2 days which she did. Remains on milrinone 0.375 mcg. Not feeling well. + Chest heaviness, feels like toddler sitting on her chest. + dizzy spells. Denies SOB or orthopnea. They have decided to defer VAD at this point. Denies palpitations.   Labs 03/29/14; Cr 1.3 k 4.1 co-ox 76%          04/20/14: K+ 5.3, creatinine 1.15          04/26/14: K 5.1 Cr 1.01          06/01/14: K 4.0, creatinine 1.28  ECHO 03/11/14 EF 15% RV ok   SH: Lives with partner, Andrea [name] and 40 year old son. She does not drive. Quit smoking 25 years ago. She does not drink alcohol  FH:   Mom Heart Failure          Father MI          Brother Heart Dissase     ROS: All systems negative except as listed in HPI, PMH and  Problem List.  Past Medical History  Diagnosis Date  . Chronic systolic heart failure   . Hypertension   . Stroke 02/2010    left brain CVA? recurrent TIA's treated with TPA  . Dyslipidemia   . Premature ventricular contractions (PVCs) (VPCs)   . GERD (gastroesophageal reflux disease)   . Coronary artery disease     a. DES-dRCA 11/2010. [name]. DES-pRCA 04/2011 (in an area free of disease on prior cath). c. s/p DES-mRCA 03/2013 for NSTEMI.  . Ischemic cardiomyopathy     a. Hx of medtronic ICD. [name]. EF 15-20% by cath 03/2013.  . Pulmonary hypertension   . Gout   . Obesity   . Thrombocytopenia   . Depression   . Hypotension     a. Admission 09/2012 for hypotn & ARF. [name]. Meds held 03/2013 due to hypotension.  . Acute renal insufficiency     a. 09/2012. [name]. Also noted post-cath 03/2013.  . NSTEMI (non-ST elevated myocardial infarction) 03/2013  . Anginal pain   . Asthma   . Microcytic anemia  a. Noted 03/2013 on labs, iron studies WNL.  Marland Kitchen Anxiety   . CHF (congestive heart failure)   . CKD (chronic kidney disease) stage 3, GFR 30-59 ml/min   . HTN (hypertension)   . Primary pulmonary HTN   . Automatic implantable cardiac defibrillator - Medtronic 08/24/2013  . Heart murmur   . Type II diabetes mellitus   . Migraine headache     "no pain; aura/visual problems only; at least 2-3X/month" (01/18/2014)  . Arthritis     "knees" (01/18/2014)  . DJD (degenerative joint disease)   . Bipolar disorder     Current Outpatient Prescriptions  Medication Sig Dispense Refill  . amiodarone (PACERONE) 200 MG tablet Take 1 tablet (200 mg total) by mouth daily.  60 tablet  3  . DULoxetine (CYMBALTA) 60 MG capsule Take 60 mg by mouth daily.      . furosemide (LASIX) 40 MG tablet Take 1 tablet (40 mg total) by mouth daily.  30 tablet  6  . gabapentin (NEURONTIN) 100 MG capsule Take 1 capsule (100 mg total) by mouth 2 (two) times daily.  60 capsule  0  . hydrALAZINE (APRESOLINE) 25 MG tablet Take 37.5 mg by  mouth 2 (two) times daily.      . magnesium oxide (MAG-OX) 400 (241.3 MG) MG tablet Take 1 tablet (400 mg total) by mouth 3 (three) times daily.  60 tablet  6  . metoprolol tartrate (LOPRESSOR) 25 MG tablet Take 1 tablet (25 mg total) by mouth 2 (two) times daily.  180 tablet  3  . milrinone (PRIMACOR) 20 MG/100ML SOLN infusion Inject 30.225 mcg/min into the vein continuous.  100 mL    . nitroGLYCERIN (NITROSTAT) 0.4 MG SL tablet Place 0.4 mg under the tongue every 5 (five) minutes as needed for chest pain.      Marland Kitchen omeprazole (PRILOSEC) 20 MG capsule Take 20 mg by mouth daily.       No current facility-administered medications for this encounter.    Filed Vitals:   06/14/14 1116  BP: 130/71  Pulse: 84  Resp: 18  Weight: 174 lb 2 oz (78.983 kg)  SpO2: 99%   PHYSICAL EXAM: General:  No acute distress. No resp difficulty. [name] present HEENT: normal Neck: supple. JVP 7. Carotids 2+ bilaterally; no bruits. No lymphadenopathy or thryomegaly appreciated. Cor: PMI normal.  Regular rate & rhythm. No rubs, or murmurs. No S3  Lungs: clear Abdomen: soft, nontender, nondistended. No hepatosplenomegaly. No bruits or masses. Good bowel sounds. Extremities: no cyanosis, clubbing, rash, edema RUE 2 lumen PICC Neuro: alert & orientedx3, cranial nerves grossly intact. Moves all 4 extremities w/o difficulty. Affect pleasant   ASSESSMENT & PLAN:  1. Chronic Systolic HF, ICM s/p ICD; EF 15% RV ok (03/11/14).  - NYHA III symptoms on milrinone 0.375. Volume status stable will continue lasix 40 mg daily. Discussed the use of sliding scale diuretics. - Very fatigued and has orthostatic hypotension. Will cut back lopressor to 12.5 mg BID. Continue hydralazine 37.5 mg BID can't remember to take TID.  Unable to tolerate IMDUR due to severe HAs in setting of known migraines.  - Not on ACE-I with h/o hyperkalemia - Not interested in proceeding with VAD at this point.  - Repeat echo in 1-2 months -  Reinforced the need and importance of daily weights, a low sodium diet, and fluid restriction (less than 2 L a day). Instructed to call the HF clinic if weight increases more than 3 lbs overnight or  5 lbs in a week. ] 2. CKD- Cardiorenal syndrome. Creatinine improved. Continue weekly BMETs with [facility]. 3. CAD S/P PCI with DES to prox RCA and stent to distal RCA in 2012 . Patient having complaints of chest tightness. EKG ok. [facility]. 4. History of liver failure and cirrhosis. Treated at [facility]---> thought to be from cardiomyopathy 5. SVT - continue amio 200 daily. Will check TSH and LFTs tomorrow.   F/u in 1 month with MD Andrea [name], Andrea [name] 11:25 AM

## 2014-06-14 NOTE — Progress Notes (Signed)
orthostatic vitals  Lying: 138/78 Sitting: 134/74 Standing: **118/72**Patient reported feeling dizzy at this point, lightly unsteady standing.

## 2014-06-15 ENCOUNTER — Telehealth (HOSPITAL_COMMUNITY): Payer: Self-pay | Admitting: Vascular Surgery

## 2014-06-15 MED ORDER — AMIODARONE HCL 200 MG PO TABS: 200.0000 mg | ORAL_TABLET | Freq: Every day | ORAL | Status: DC

## 2014-06-15 NOTE — Telephone Encounter (Signed)
Pt needs refill Amiondarone [facility] day supply @ [facility]

## 2014-06-15 NOTE — Telephone Encounter (Signed)
rx refill sent to pharmacy 

## 2014-06-16 ENCOUNTER — Encounter: Payer: Self-pay | Admitting: Gastroenterology

## 2014-06-18 ENCOUNTER — Encounter: Payer: Self-pay | Admitting: Internal Medicine

## 2014-06-27 IMAGING — CR DG CHEST 2V
2 series · 2 of 2 positions shown · non-contrast
Comparison: 08/21/2013

CLINICAL DATA: Pneumonia. Cough.

EXAM:
CHEST  2 VIEW

[w chest pa]
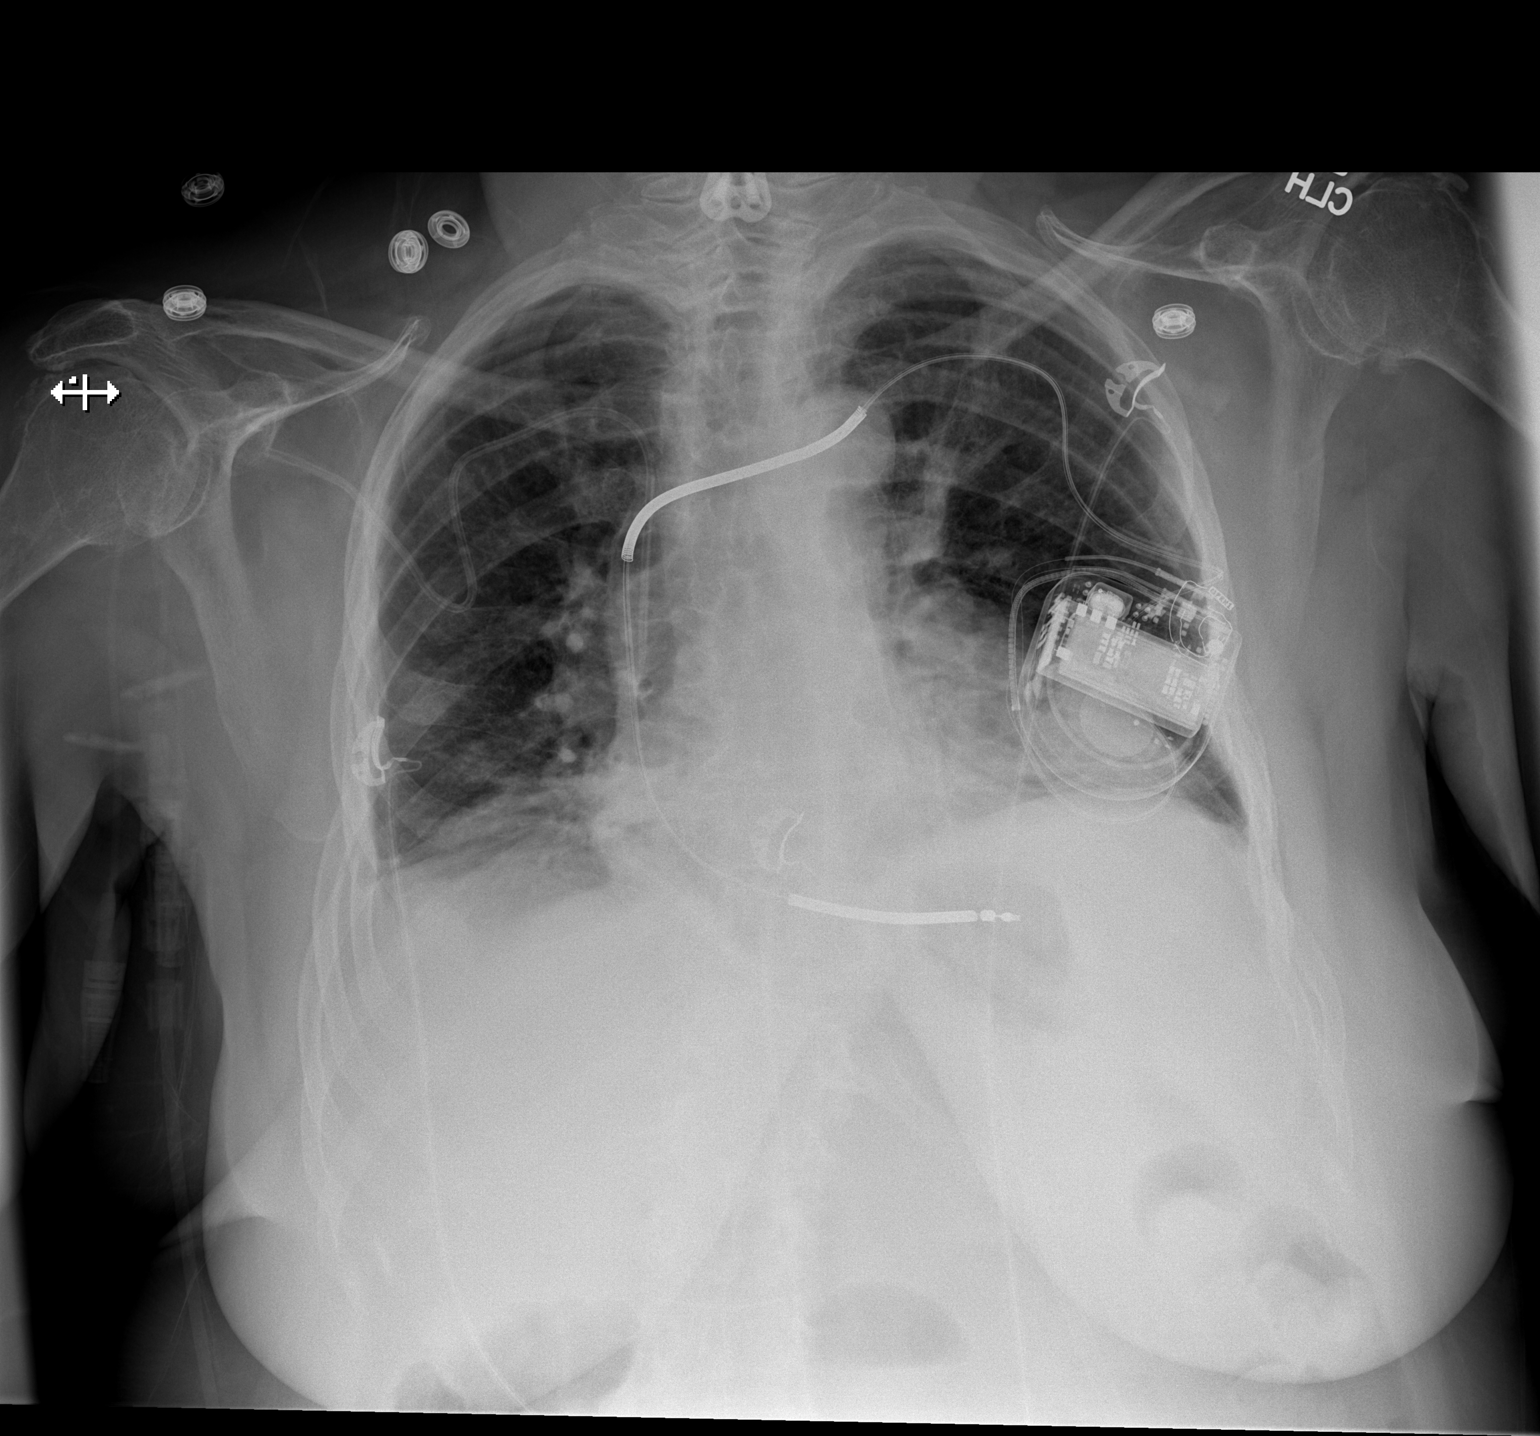

[w chest lat]
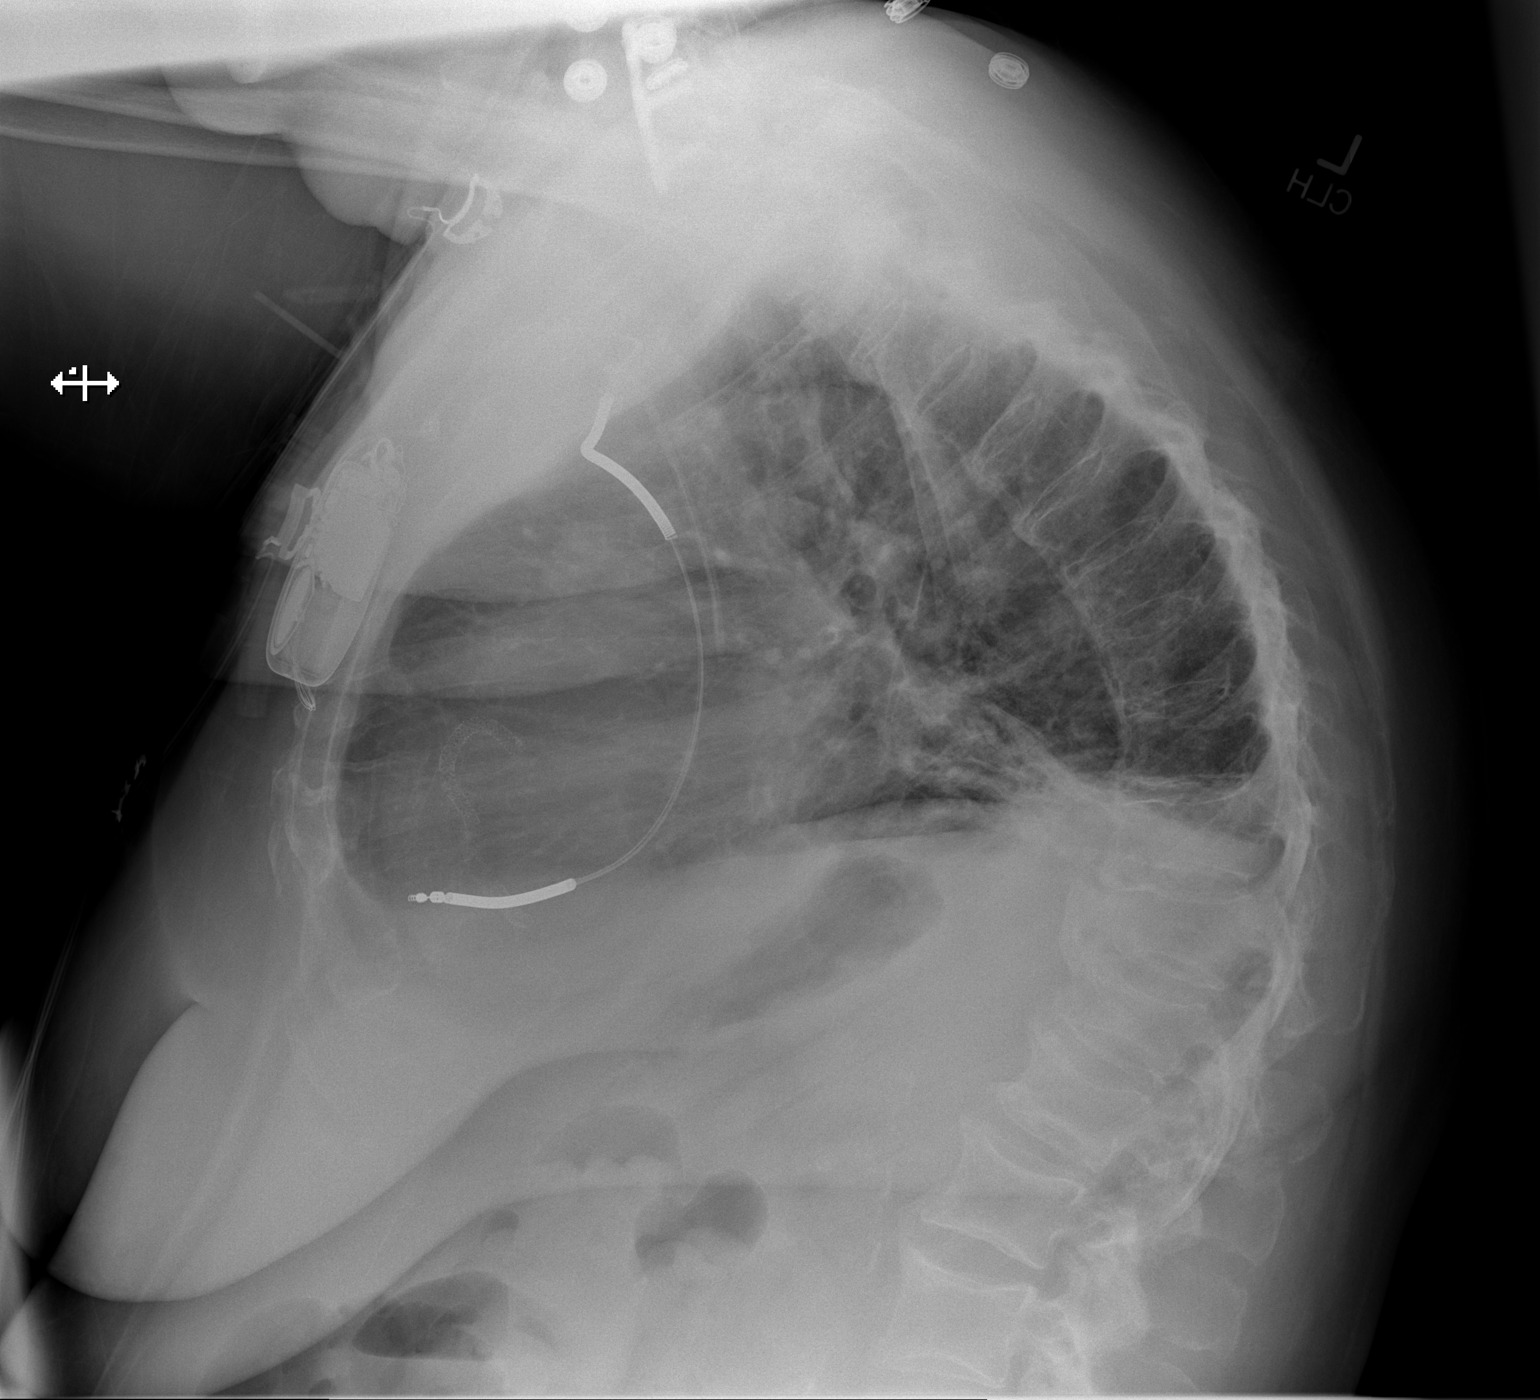

[2 of 2 positions shown; findings below may reference images not displayed]

FINDINGS: Right subclavian central line tip: Lower SVC. AICD noted.

Low lung volumes are present, causing crowding of the pulmonary
vasculature. Mild cardiomegaly noted. There is platelike atelectasis
along the hemidiaphragms, with improved aeration at the left lung
base. Minimal left perihilar atelectasis.
IMPRESSION: 1. Platelike atelectasis at the lung bases. Left basilar airspace
opacity has improved.
2. Low lung volumes.

## 2014-06-29 IMAGING — CR DG CHEST 1V PORT
1 series · 1 of 1 positions shown · non-contrast
Comparison: 08/22/2013

CLINICAL DATA: Shortness of breath and cough

PORTABLE CHEST - 1 VIEW

[AP]
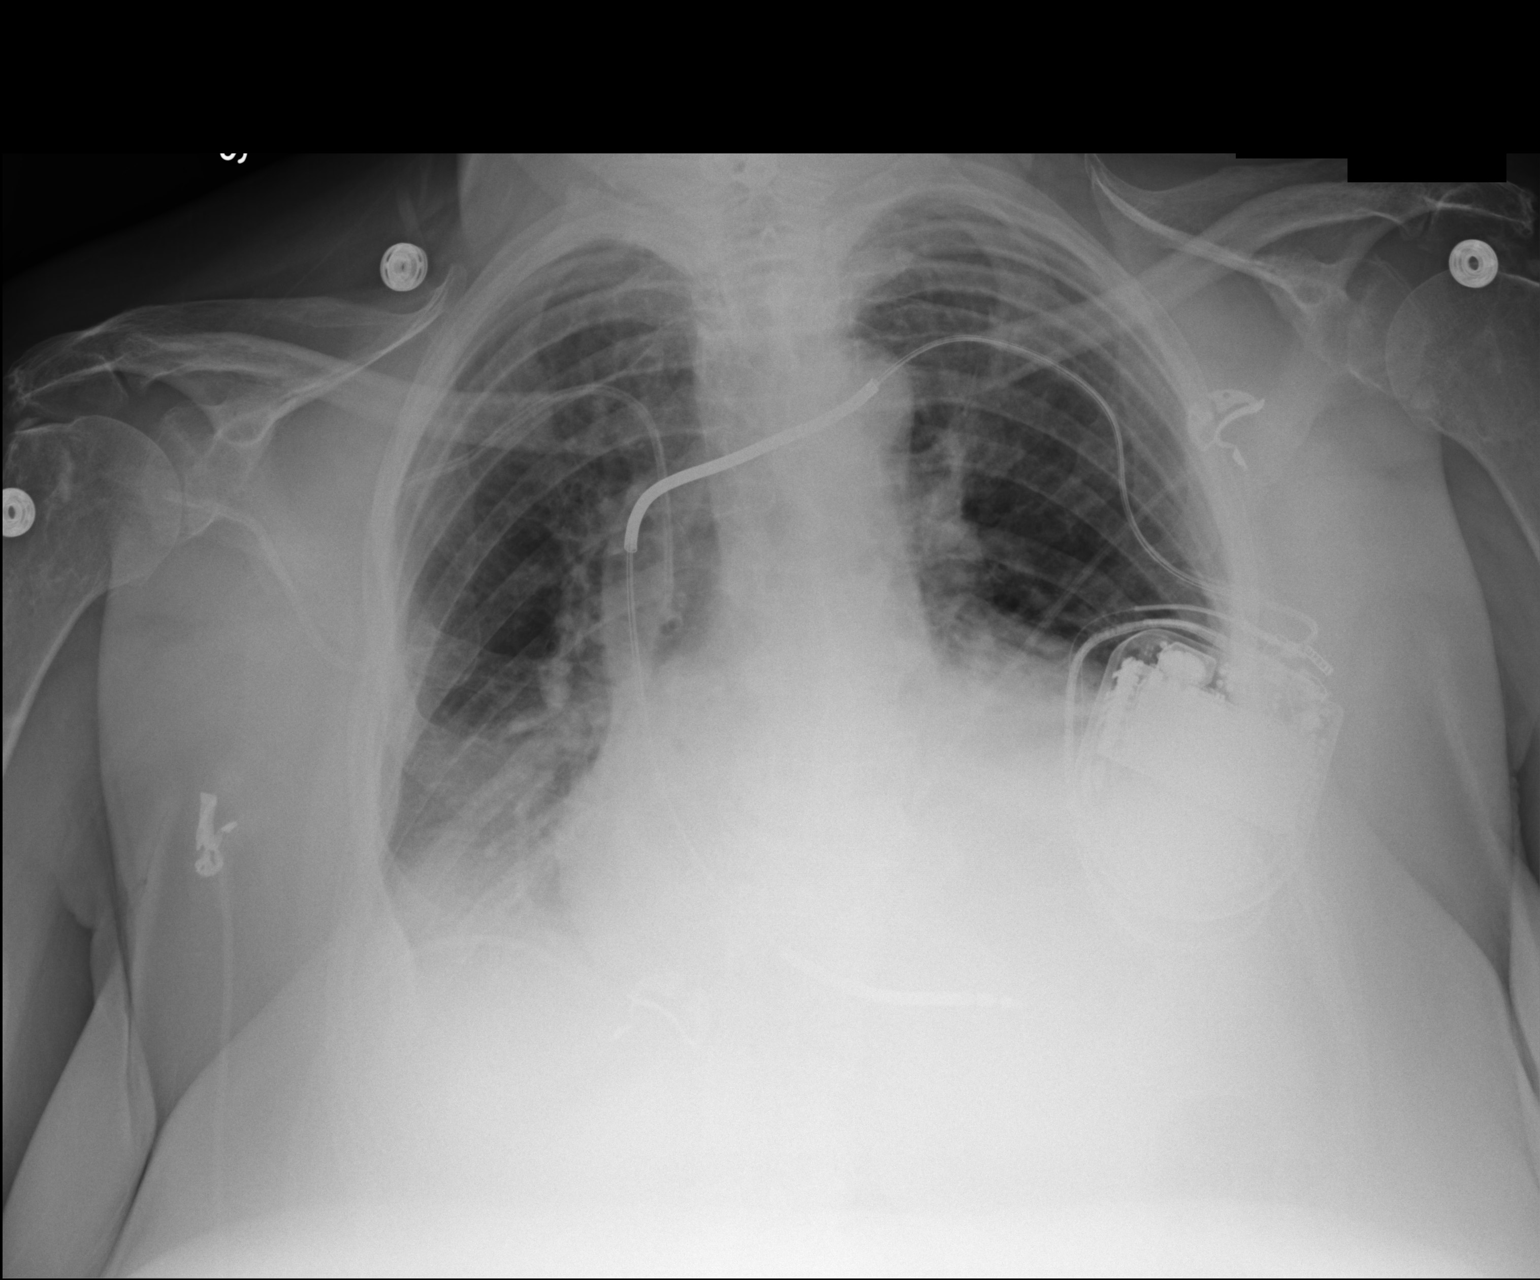

[1 of 1 positions shown; findings below may reference images not displayed]

FINDINGS: The defibrillator is again seen and stable.  Stable
cardiomegaly is noted.  A right-sided central venous line is again
seen and unchanged.  Bilateral pleural effusions are seen which
appear new from prior exam.  There is likely underlying atelectasis
as well.
IMPRESSION: New bilateral pleural effusions.

## 2014-06-30 IMAGING — CR DG CHEST 2V
2 series · 2 of 2 positions shown · non-contrast
Comparison: August 24, 2013

CLINICAL DATA: Shortness of breath and cough

CHEST - 2 VIEW

[w chest pa]
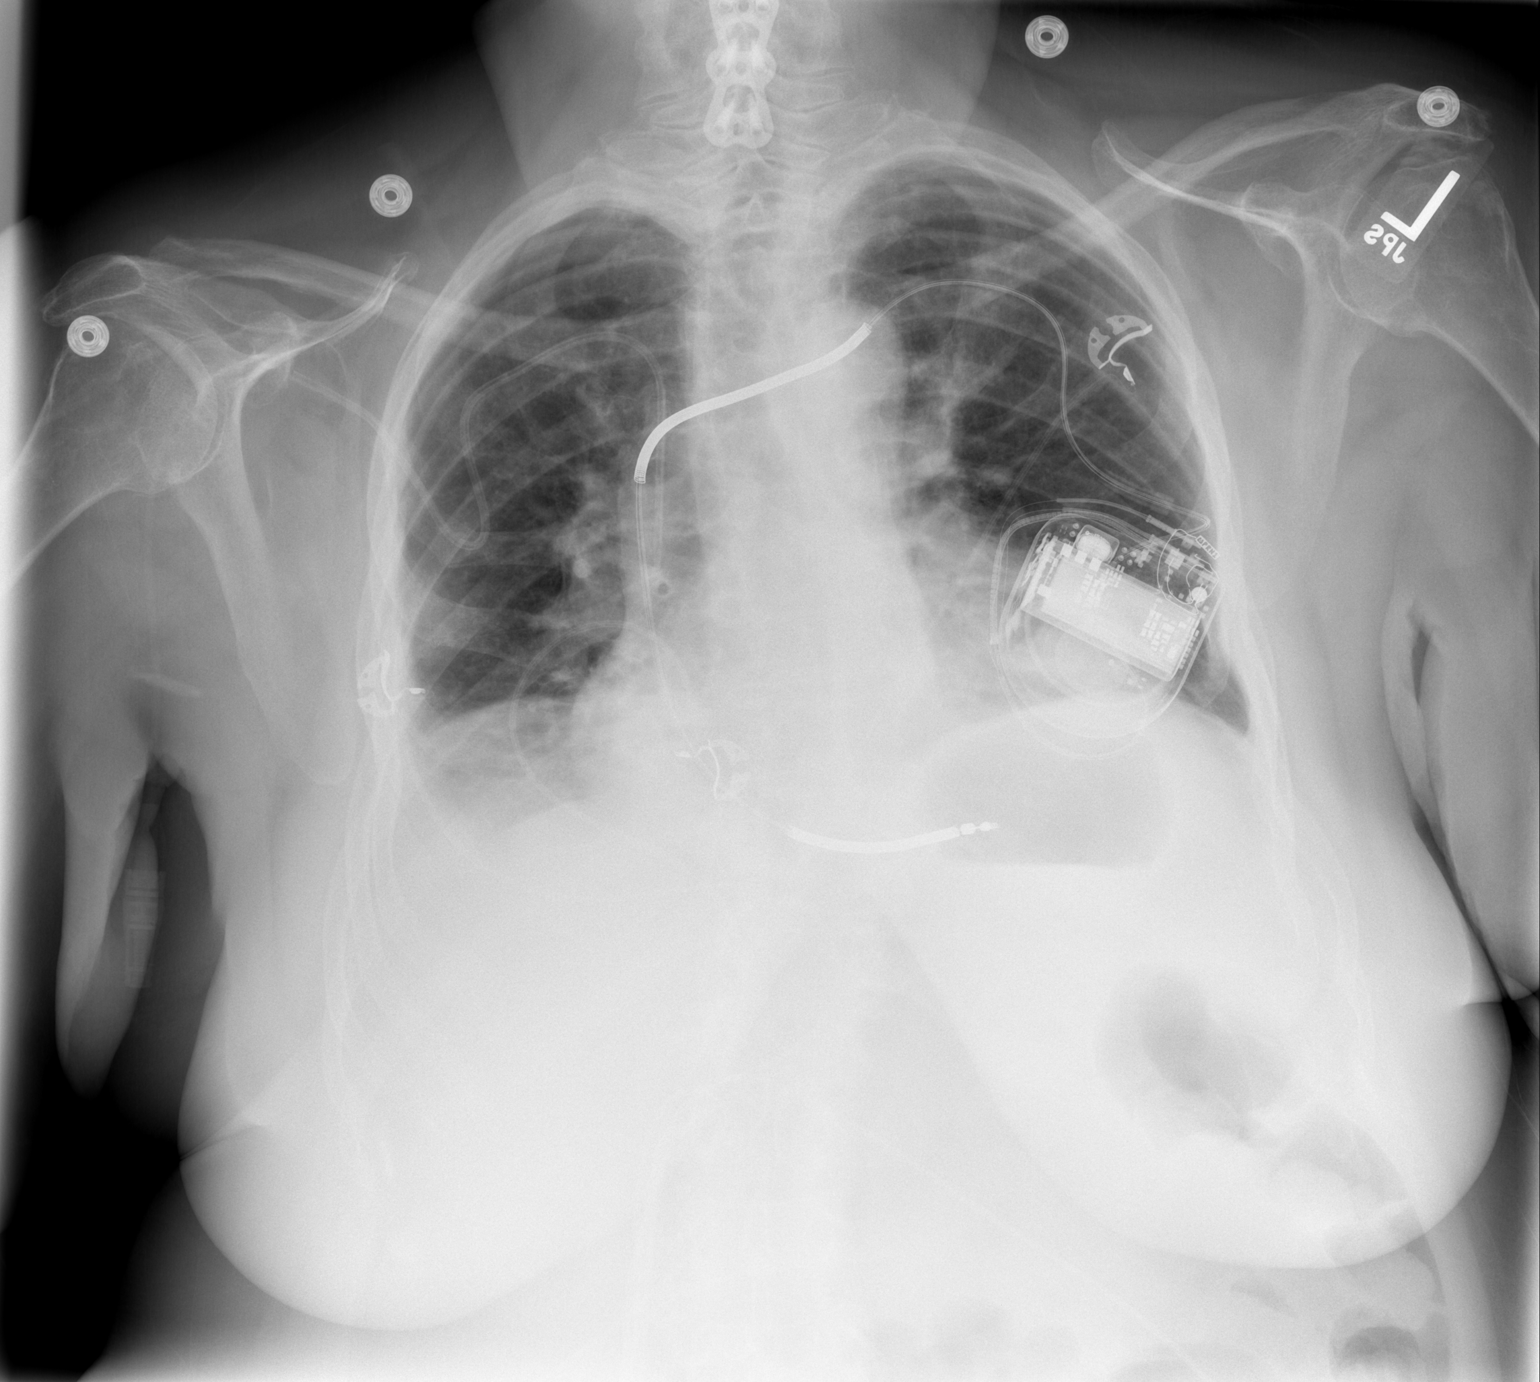

[w chest lat]
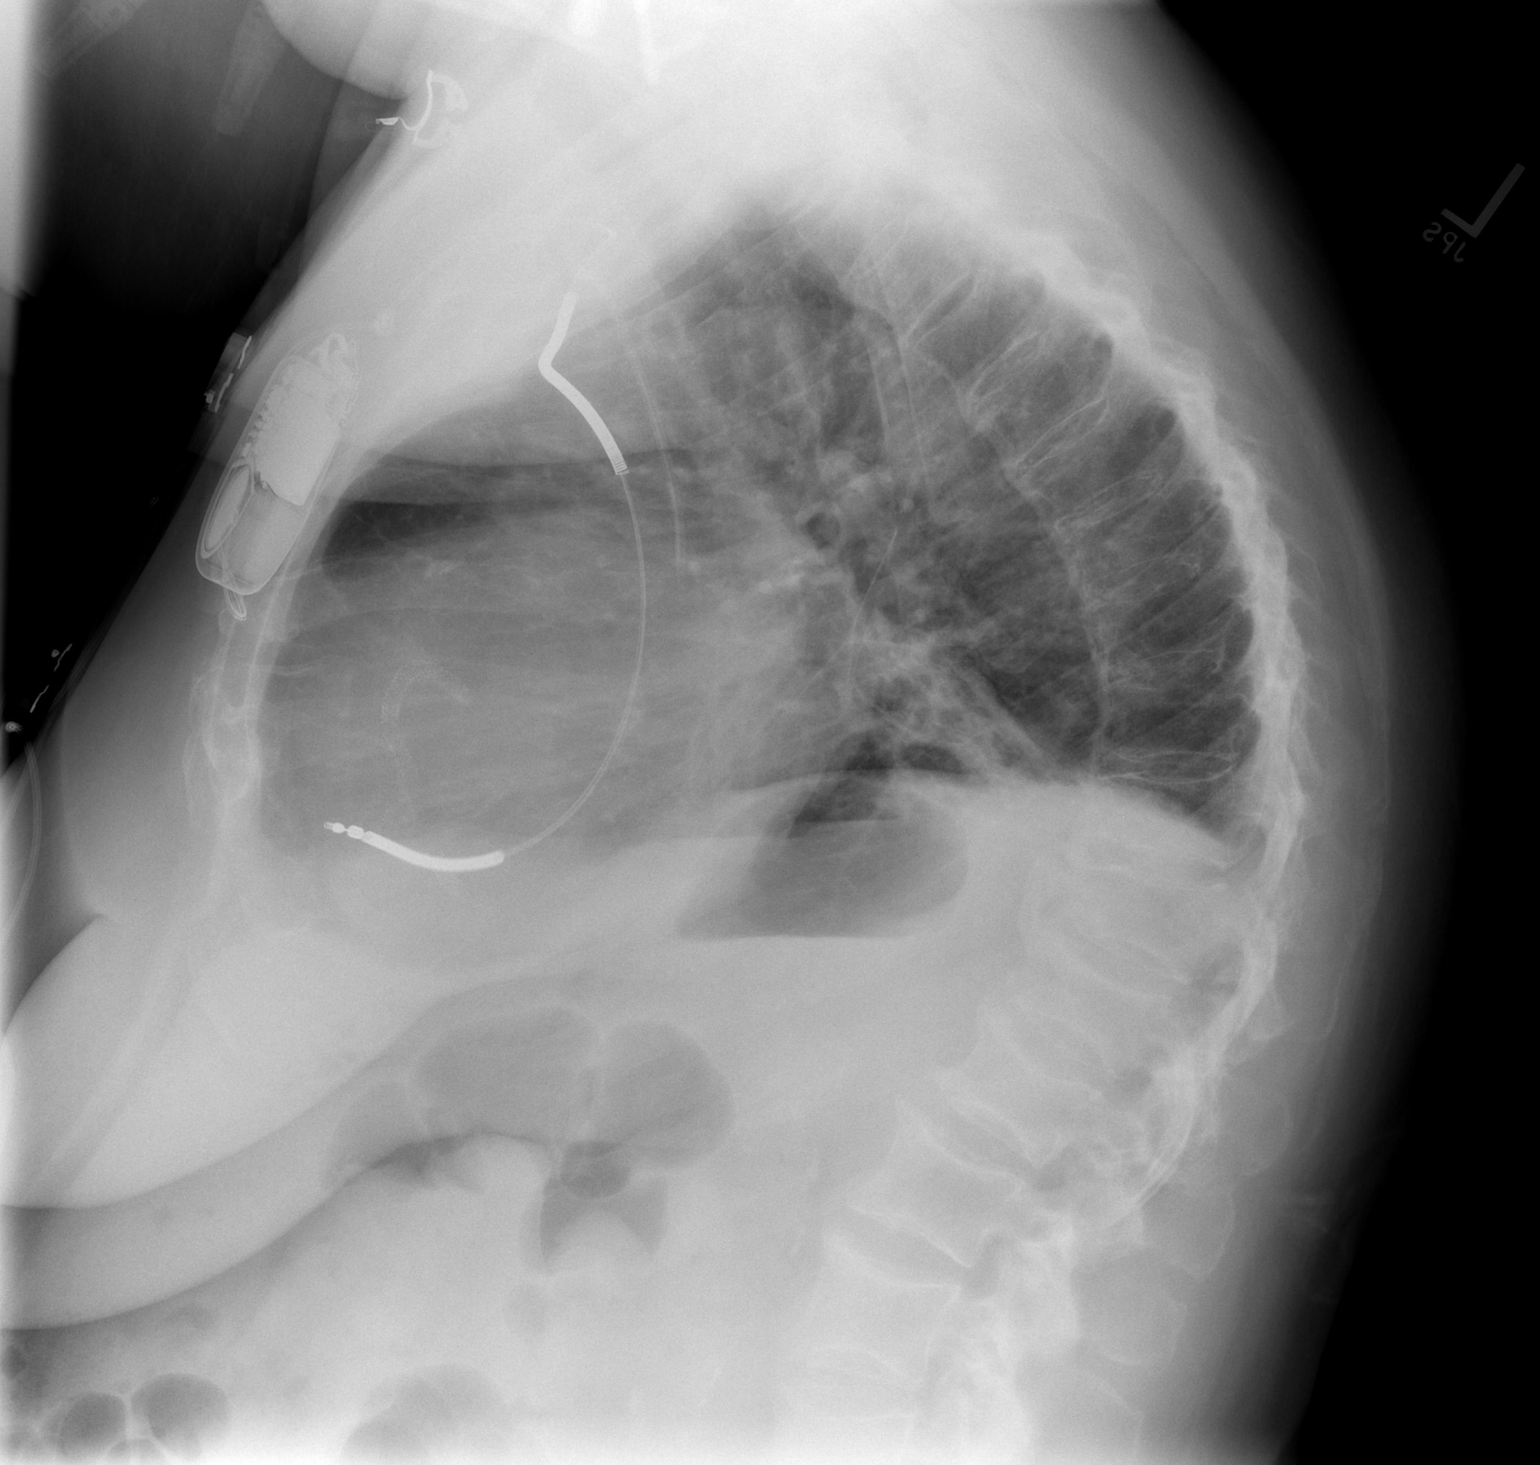

[2 of 2 positions shown; findings below may reference images not displayed]

FINDINGS: There is a small right pleural effusion, unchanged.
There is minimal left pleural effusion, improved.  There is no
pulmonary edema.  The heart size is enlarged.  The aorta is
tortuous.  Cardiac pacemaker is identified unchanged.  Right
central venous line is unchanged.  The soft tissues and osseous
structures are stable.
IMPRESSION: No evidence of pulmonary edema.  Small right pleural effusion and
minimal left pleural effusion.

## 2014-07-01 ENCOUNTER — Encounter: Payer: Self-pay | Admitting: Internal Medicine

## 2014-07-12 ENCOUNTER — Encounter (HOSPITAL_COMMUNITY): Payer: BC Managed Care – PPO

## 2014-07-14 ENCOUNTER — Telehealth (HOSPITAL_COMMUNITY): Payer: Self-pay

## 2014-07-14 NOTE — Telephone Encounter (Signed)
Lab results reviewed with patient, instructed to old lasix x 2 days, aware and agreeable. Ave Filter

## 2014-07-16 NOTE — Progress Notes (Signed)
Patient ID: Andrea HeadingsCorinne G [name], female   DOB: 07/23/1951, 63 y.o.   MRN: [id number]  PCP: Dr. Elias Elseobert Reade  [facility]   HPI: Andrea [name] is a 63 y.o. female with a history of CAD DES to prox RHC and stent to dist RCA in 2012, chronic systolic heart failure, ICM s/p ICD placement, liver failure thougth to be from cardiomyopathy -[facility], CVA intolerant aspirin , CKD, GERD, and DM who was admitted on 03/08/14 with N/V and dyspnea. Admitting creatinine was 1.3 but increased to 4.49 and this was thought to be from hypotension and low output heart failure.  She was placed on Milrinone and diuresed with IV lasix. Milrinone was weaned off but restarted due to mixed venous saturation 39%. At that point she was discharged on Milrinone at 0.125 mcg via PICC. Discharge creatinine was 2.0. Discharge weight was 176 pounds.    R/LHC 04/12/14: RA = 10  RV = 60/4/8  PA = 60/30 (42)  PCWP = 25  Fick CO/CI = 3.6/2.1  Thermo CO/CI = 2.9/1.6  PVR = 5.8  SVR = 1,911  Ao sat = 91%  PA sats = 46%, 49% 1. Nonobstructive CAD with patent stent RCA  Follow up for Heart Failure: She returns for follow up with her girlfriend.  Last visit lopressor was cut back to 12.5 mg twice a day however she did not change. She continued lopressor 25 mg twice a day. Denies SOB/PND/Orthopnea. Has occasional headaches around her eyes. Says she is getting stronger. She is able to do a few chores around the house. Weight up at home 170-175 pounds.  Remains on milrinone 0.375 mcg. They do not want LVAD but would like to consider LVAD. [facility] following for home milrinone and weekly bmet.     Labs 03/29/14; Cr 1.3 k 4.1 co-ox 76%          04/20/14: K+ 5.3, creatinine 1.15          04/26/14: K 5.1 Cr 1.01          06/01/14: K 4.0, creatinine 1.28  ECHO 03/11/14 EF 15% RV ok   SH: Lives with partner, Andrea [name] and 63 year old son. She does not drive. Quit smoking 25 years ago. She does not drink alcohol  FH:   Mom Heart  Failure          Father MI          Brother Heart Dissase     ROS: All systems negative except as listed in HPI, PMH and Problem List.  Past Medical History  Diagnosis Date  . Chronic systolic heart failure   . Hypertension   . Stroke 02/2010    left brain CVA? recurrent TIA's treated with TPA  . Dyslipidemia   . Premature ventricular contractions (PVCs) (VPCs)   . GERD (gastroesophageal reflux disease)   . Coronary artery disease     a. DES-dRCA 11/2010. b. DES-pRCA 04/2011 (in an area free of disease on prior cath). c. s/p DES-mRCA 03/2013 for NSTEMI.  . Ischemic cardiomyopathy     a. Hx of medtronic ICD. b. EF 15-20% by cath 03/2013.  . Pulmonary hypertension   . Gout   . Obesity   . Thrombocytopenia   . Depression   . Hypotension     a. Admission 09/2012 for hypotn & ARF. b. Meds held 03/2013 due to hypotension.  . Acute renal insufficiency     a. 09/2012. b. Also noted post-cath 03/2013.  .Andrea [name]  NSTEMI (non-ST elevated myocardial infarction) 03/2013  . Anginal pain   . Asthma   . Microcytic anemia     a. Noted 03/2013 on labs, iron studies WNL.  Andrea [name] Anxiety   . CHF (congestive heart failure)   . CKD (chronic kidney disease) stage 3, GFR 30-59 ml/min   . HTN (hypertension)   . Primary pulmonary HTN   . Automatic implantable cardiac defibrillator - Medtronic 08/24/2013  . Heart murmur   . Type II diabetes mellitus   . Migraine headache     "no pain; aura/visual problems only; at least 2-3X/month" (01/18/2014)  . Arthritis     "knees" (01/18/2014)  . DJD (degenerative joint disease)   . Bipolar disorder     Current Outpatient Prescriptions  Medication Sig Dispense Refill  . amiodarone (PACERONE) 200 MG tablet Take 1 tablet (200 mg total) by mouth daily.  90 tablet  3  . DULoxetine (CYMBALTA) 60 MG capsule Take 60 mg by mouth daily.      . furosemide (LASIX) 40 MG tablet Take 1 tablet (40 mg total) by mouth daily.  90 tablet  6  . gabapentin (NEURONTIN) 100 MG capsule Take 1  capsule (100 mg total) by mouth 2 (two) times daily.  60 capsule  0  . hydrALAZINE (APRESOLINE) 25 MG tablet Take 1.5 tablets (37.5 mg total) by mouth 2 (two) times daily.  270 tablet  3  . magnesium oxide (MAG-OX) 400 (241.3 MG) MG tablet Take 1 tablet (400 mg total) by mouth 3 (three) times daily.  60 tablet  6  . metoprolol tartrate (LOPRESSOR) 25 MG tablet Take 0.5 tablets (12.5 mg total) by mouth 2 (two) times daily.  90 tablet  3  . milrinone (PRIMACOR) 20 MG/100ML SOLN infusion Inject 30.225 mcg/min into the vein continuous.  100 mL    . nitroGLYCERIN (NITROSTAT) 0.4 MG SL tablet Place 0.4 mg under the tongue every 5 (five) minutes as needed for chest pain.      Andrea [name] omeprazole (PRILOSEC) 20 MG capsule Take 20 mg by mouth daily.       No current facility-administered medications for this encounter.    Filed Vitals:   07/19/14 1216  BP: 139/85  Pulse: 98  Resp: 20  Weight: 176 lb 6 oz (80.003 kg)  SpO2: 99%   PHYSICAL EXAM: General:  No acute distress. No resp difficulty. [name] present HEENT: normal Neck: supple. JVP 6-7. Carotids 2+ bilaterally; no bruits. No lymphadenopathy or thryomegaly appreciated. Cor: PMI normal.  Regular rate & rhythm. No rubs, or murmurs. No S3  Lungs: clear Abdomen: soft, nontender, nondistended. No hepatosplenomegaly. No bruits or masses. Good bowel sounds. Extremities: no cyanosis, clubbing, rash, edema RUE 2 lumen PICC Neuro: alert & orientedx3, cranial nerves grossly intact. Moves all 4 extremities w/o difficulty. Affect pleasant   ASSESSMENT & PLAN:  1. Chronic Systolic HF, ICM s/p ICD; EF 15% RV ok (03/11/14).  - NYHA III. Overall doing ok. Continue milrinone 0.375. Volume status stable. Continue lasix 40 mg daily.  For now continue lopressor to 25 mg twice a day. She did not cut back to 12.5 mg twice a day.   Continue hydralazine 37.5 mg TID unable to tolerate IMDUR due to severe HAs in setting of known migraines.  - Not on ACE-I with h/o  hyperkalemia - She is not interested in LAVD but now would like to consider transplant.   - Repeat ECHO  - Reinforced the need and importance of daily weights, a  low sodium diet, and fluid restriction (less than 2 L a day). Instructed to call the HF clinic if weight increases more than 3 lbs overnight or 5 lbs in a week. ] 2. CKD- BMET per [facility] weekly.  3. CAD S/P PCI with DES to prox RCA and stent to distal RCA in 2012 . No evidence of ischemia.  4. History of liver failure and cirrhosis. Treated at [facility]---> thought to be from cardiomyopathy 5. SVT - continue amio 200 daily.   Follow up in 2 weeks with an ECHO.   Madelyn Tlatelpa 3:55 PM

## 2014-07-19 ENCOUNTER — Encounter: Payer: Self-pay | Admitting: Internal Medicine

## 2014-07-19 ENCOUNTER — Ambulatory Visit (HOSPITAL_COMMUNITY)
Admission: RE | Admit: 2014-07-19 | Discharge: 2014-07-19 | Disposition: A | Discharge status: Home / Self Care | Payer: BC Managed Care – PPO | Source: Ambulatory Visit | Attending: Cardiology | Admitting: Cardiology

## 2014-07-19 ENCOUNTER — Encounter (HOSPITAL_COMMUNITY): Payer: Self-pay

## 2014-07-19 VITALS — BP 139/85 | HR 98 | Resp 20 | Wt 176.4 lb

## 2014-07-19 DIAGNOSIS — F3289 Other specified depressive episodes: Secondary | ICD-10-CM | POA: Insufficient documentation

## 2014-07-19 DIAGNOSIS — N183 Chronic kidney disease, stage 3 unspecified: Secondary | ICD-10-CM

## 2014-07-19 DIAGNOSIS — K219 Gastro-esophageal reflux disease without esophagitis: Secondary | ICD-10-CM | POA: Insufficient documentation

## 2014-07-19 DIAGNOSIS — I129 Hypertensive chronic kidney disease with stage 1 through stage 4 chronic kidney disease, or unspecified chronic kidney disease: Secondary | ICD-10-CM | POA: Insufficient documentation

## 2014-07-19 DIAGNOSIS — F329 Major depressive disorder, single episode, unspecified: Secondary | ICD-10-CM | POA: Insufficient documentation

## 2014-07-19 DIAGNOSIS — I5022 Chronic systolic (congestive) heart failure: Secondary | ICD-10-CM

## 2014-07-19 DIAGNOSIS — E669 Obesity, unspecified: Secondary | ICD-10-CM | POA: Insufficient documentation

## 2014-07-19 DIAGNOSIS — E119 Type 2 diabetes mellitus without complications: Secondary | ICD-10-CM | POA: Insufficient documentation

## 2014-07-19 DIAGNOSIS — J45909 Unspecified asthma, uncomplicated: Secondary | ICD-10-CM | POA: Insufficient documentation

## 2014-07-19 DIAGNOSIS — I259 Chronic ischemic heart disease, unspecified: Secondary | ICD-10-CM

## 2014-07-19 DIAGNOSIS — I509 Heart failure, unspecified: Secondary | ICD-10-CM

## 2014-07-19 DIAGNOSIS — I2510 Atherosclerotic heart disease of native coronary artery without angina pectoris: Secondary | ICD-10-CM | POA: Insufficient documentation

## 2014-07-19 DIAGNOSIS — I498 Other specified cardiac arrhythmias: Secondary | ICD-10-CM | POA: Insufficient documentation

## 2014-07-19 DIAGNOSIS — Z95810 Presence of automatic (implantable) cardiac defibrillator: Secondary | ICD-10-CM | POA: Insufficient documentation

## 2014-07-19 NOTE — Patient Instructions (Signed)
Follow up in 2 weeks with an ECHO    Do the following things EVERYDAY: 1) Weigh yourself in the morning before breakfast. Write it down and keep it in a log. 2) Take your medicines as prescribed 3) Eat low salt foods-Limit salt (sodium) to 2000 mg per day.  4) Stay as active as you can everyday 5) Limit all fluids for the day to less than 2 liters

## 2014-07-20 ENCOUNTER — Telehealth (HOSPITAL_COMMUNITY): Payer: Self-pay | Admitting: Vascular Surgery

## 2014-07-20 NOTE — Telephone Encounter (Addendum)
REFILL Gabapentin 100mg .. PT wants thias prescription called in to [facility] on [address]

## 2014-07-21 ENCOUNTER — Other Ambulatory Visit (HOSPITAL_COMMUNITY): Payer: Self-pay

## 2014-07-23 ENCOUNTER — Telehealth (HOSPITAL_COMMUNITY): Payer: Self-pay | Admitting: Vascular Surgery

## 2014-07-23 NOTE — Telephone Encounter (Signed)
[facility].Marland Kitchen Resert pt 1 time a week for 9 weeks.... [facility] told pt to go to the ED pt face and upper body swollen. Weight 174.6 .Marland Kitchen

## 2014-07-29 MED ORDER — GABAPENTIN 100 MG PO CAPS: 100.0000 mg | ORAL_CAPSULE | Freq: Two times a day (BID) | ORAL | Status: DC

## 2014-08-02 ENCOUNTER — Encounter: Payer: Self-pay | Admitting: Internal Medicine

## 2014-08-02 ENCOUNTER — Telehealth (HOSPITAL_COMMUNITY): Payer: Self-pay | Admitting: Vascular Surgery

## 2014-08-02 DIAGNOSIS — I5022 Chronic systolic (congestive) heart failure: Secondary | ICD-10-CM

## 2014-08-02 NOTE — Telephone Encounter (Signed)
Refill Gabapentin, Magnesium oxide sent to [facility] on [address]

## 2014-08-03 MED ORDER — MAGNESIUM OXIDE 400 (241.3 MG) MG PO TABS: 400.0000 mg | ORAL_TABLET | Freq: Three times a day (TID) | ORAL | Status: DC

## 2014-08-03 MED ORDER — HYDRALAZINE HCL 25 MG PO TABS: 37.5000 mg | ORAL_TABLET | Freq: Two times a day (BID) | ORAL | Status: DC

## 2014-08-03 NOTE — Telephone Encounter (Signed)
meds requested sent in to pharmacy

## 2014-08-09 ENCOUNTER — Ambulatory Visit (HOSPITAL_COMMUNITY)
Admission: RE | Admit: 2014-08-09 | Discharge: 2014-08-09 | Disposition: A | Discharge status: Home / Self Care | Payer: BC Managed Care – PPO | Source: Ambulatory Visit | Attending: Internal Medicine | Admitting: Internal Medicine

## 2014-08-09 ENCOUNTER — Ambulatory Visit (HOSPITAL_BASED_OUTPATIENT_CLINIC_OR_DEPARTMENT_OTHER)
Admission: RE | Admit: 2014-08-09 | Discharge: 2014-08-09 | Disposition: A | Discharge status: Home / Self Care | Payer: BC Managed Care – PPO | Source: Ambulatory Visit | Attending: Internal Medicine | Admitting: Internal Medicine

## 2014-08-09 ENCOUNTER — Encounter (HOSPITAL_COMMUNITY): Payer: Self-pay

## 2014-08-09 VITALS — BP 118/64 | HR 91 | Wt 181.0 lb

## 2014-08-09 DIAGNOSIS — I5022 Chronic systolic (congestive) heart failure: Secondary | ICD-10-CM

## 2014-08-09 DIAGNOSIS — I059 Rheumatic mitral valve disease, unspecified: Secondary | ICD-10-CM

## 2014-08-09 DIAGNOSIS — I509 Heart failure, unspecified: Secondary | ICD-10-CM | POA: Insufficient documentation

## 2014-08-09 MED ORDER — MAGNESIUM OXIDE 400 (241.3 MG) MG PO TABS: 400.0000 mg | ORAL_TABLET | Freq: Three times a day (TID) | ORAL | Status: AC

## 2014-08-09 MED ORDER — HYDRALAZINE HCL 50 MG PO TABS: 50.0000 mg | ORAL_TABLET | Freq: Two times a day (BID) | ORAL | Status: DC

## 2014-08-09 MED ORDER — CARVEDILOL 3.125 MG PO TABS: 3.1250 mg | ORAL_TABLET | Freq: Two times a day (BID) | ORAL | Status: DC

## 2014-08-09 NOTE — Progress Notes (Signed)
  Echocardiogram 2D Echocardiogram has been performed.  Arvil Chaco 08/09/2014, 12:20 PM

## 2014-08-09 NOTE — Progress Notes (Signed)
Patient ID: Andrea [name], female   DOB: 26-Apr-1951, 63 y.o.   MRN: [id number]  PCP: Dr. Elias Else  [facility])   HPI: Andrea [name] is a 63 y.o. female with a history of CAD DES to prox RCA and stent to dist RCA in 2012, chronic systolic heart failure, ICM s/p ICD placement, liver failure thougth to be from cardiomyopathy -seen at [facility]), CVA intolerant aspirin , CKD, GERD, and DM.  Admitted 03/08/14 with low output HF. Admitting creatinine was 1.3 but increased to 4.49.  She was placed on Milrinone and diuresed with IV lasix. Milrinone was weaned off but restarted due to mixed venous saturation 39%. At that point she was discharged on Milrinone at 0.125 mcg via PICC. Discharge creatinine was 2.0. Discharge weight was 176 pounds.  Milrinone subsequently titrated to 0.375 mcg/kg/min  R/LHC 04/12/14: RA = 10  RV = 60/4/8  PA = 60/30 (42)  PCWP = 25  Fick CO/CI = 3.6/2.1  Thermo CO/CI = 2.9/1.6  PVR = 5.8  SVR = 1,911  Ao sat = 91%  PA sats = 46%, 49% 1. Nonobstructive CAD with patent stent RCA  ECHO 03/11/14 EF 15% RV ok   Follow up for Heart Failure: She returns for follow up with her partner.  Echo today EF ~ 30-35% on milrinone 0.375. Able to do all ADLs without too much trouble. In [place] was able to walk 4-5 blocks without problem. Denies SOB/PND/Orthopnea. No edema. Weight up at home 170-175 -> 177 pounds. Takes extra lasix about 1x/week. No problems with meds.  Remains on milrinone 0.375 mcg. They do not want LVAD but would like to consider TX. [facility] following for home milrinone and weekly bmet. Only takes hydralazine BID due to sleeping patterns.   Labs 03/29/14; Cr 1.3 k 4.1 co-ox 76%          04/20/14: K+ 5.3, creatinine 1.15          04/26/14: K 5.1 Cr 1.01          06/01/14:   K 4.0, creatinine 1.28          07/20/14: K 3.5 Cr 1.16   SH: Lives with partner, [name] and 77 year old son. She does not drive. Quit smoking 25 years ago. She does not drink alcohol  FH:    Mom Heart Failure          Father MI          Brother Heart Dissase     ROS: All systems negative except as listed in HPI, PMH and Problem List.  Past Medical History  Diagnosis Date  . Chronic systolic heart failure   . Hypertension   . Stroke 02/2010    left brain CVA? recurrent TIA's treated with TPA  . Dyslipidemia   . Premature ventricular contractions (PVCs) (VPCs)   . GERD (gastroesophageal reflux disease)   . Coronary artery disease     a. DES-dRCA 11/2010. b. DES-pRCA 04/2011 (in an area free of disease on prior cath). c. s/p DES-mRCA 03/2013 for NSTEMI.  . Ischemic cardiomyopathy     a. Hx of medtronic ICD. b. EF 15-20% by cath 03/2013.  . Pulmonary hypertension   . Gout   . Obesity   . Thrombocytopenia   . Depression   . Hypotension     a. Admission 09/2012 for hypotn & ARF. b. Meds held 03/2013 due to hypotension.  . Acute renal insufficiency     a. 09/2012.  b. Also noted post-cath 03/2013.  . NSTEMI (non-ST elevated myocardial infarction) 03/2013  . Anginal pain   . Asthma   . Microcytic anemia     a. Noted 03/2013 on labs, iron studies WNL.  Marland Kitchen Anxiety   . CHF (congestive heart failure)   . CKD (chronic kidney disease) stage 3, GFR 30-59 ml/min   . HTN (hypertension)   . Primary pulmonary HTN   . Automatic implantable cardiac defibrillator - Medtronic 08/24/2013  . Heart murmur   . Type II diabetes mellitus   . Migraine headache     "no pain; aura/visual problems only; at least 2-3X/month" (01/18/2014)  . Arthritis     "knees" (01/18/2014)  . DJD (degenerative joint disease)   . Bipolar disorder     Current Outpatient Prescriptions  Medication Sig Dispense Refill  . amiodarone (PACERONE) 200 MG tablet Take 1 tablet (200 mg total) by mouth daily.  90 tablet  3  . DULoxetine (CYMBALTA) 60 MG capsule Take 60 mg by mouth daily.      . furosemide (LASIX) 40 MG tablet Take 1 tablet (40 mg total) by mouth daily.  90 tablet  6  . gabapentin (NEURONTIN) 100 MG capsule  Take 1 capsule (100 mg total) by mouth 2 (two) times daily.  60 capsule  0  . hydrALAZINE (APRESOLINE) 25 MG tablet Take 1.5 tablets (37.5 mg total) by mouth 2 (two) times daily.  90 tablet  0  . magnesium oxide (MAG-OX) 400 (241.3 MG) MG tablet Take 1 tablet (400 mg total) by mouth 3 (three) times daily.  90 tablet  3  . metoprolol tartrate (LOPRESSOR) 25 MG tablet Take 0.5 tablets (12.5 mg total) by mouth 2 (two) times daily.  90 tablet  3  . milrinone (PRIMACOR) 20 MG/100ML SOLN infusion Inject 30.225 mcg/min into the vein continuous.  100 mL    . nitroGLYCERIN (NITROSTAT) 0.4 MG SL tablet Place 0.4 mg under the tongue every 5 (five) minutes as needed for chest pain.      Marland Kitchen omeprazole (PRILOSEC) 20 MG capsule Take 20 mg by mouth daily.       No current facility-administered medications for this encounter.    Filed Vitals:   08/09/14 1217  BP: 118/64  Pulse: 91  Weight: 181 lb (82.101 kg)  SpO2: 98%   PHYSICAL EXAM: General:  No acute distress. No resp difficulty. [name] present HEENT: normal Neck: supple. JVP 6-7. Carotids 2+ bilaterally; no bruits. No lymphadenopathy or thryomegaly appreciated. Cor: PMI normal.  Regular rate & rhythm. No rubs, or murmurs. No S3  Lungs: clear Abdomen: soft, nontender, nondistended. No hepatosplenomegaly. No bruits or masses. Good bowel sounds. Extremities: no cyanosis, clubbing, rash, edema RUE 2 lumen PICC Neuro: alert & orientedx3, cranial nerves grossly intact. Moves all 4 extremities w/o difficulty. Affect pleasant   ASSESSMENT & PLAN:  1. Chronic Systolic HF, ICM s/p ICD; EF 15% RV ok (03/11/14).  - Continues to improve on milrinone. NYHA II-III. EF ~ 30-35% on milrinone today - Volume status stable. Continue lasix 40 mg daily.  - Not interested in VAD. Would consider transplant but wants to just continue milrinone for now. (We discussed referral to [facility] for Tx eval but she wants to hold off) - Change lopressor to carvedilol 3.125 bid -  Increase hydralazine to 50 bid unable to take tid or to tolerate IMDUR due to severe HAs in setting of known migraines.  - Not on ACE-I with h/o hyperkalemia - Consider decreasing  milrinone to 0.25 at next visit - Reinforced the need and importance of daily weights, a low sodium diet, and fluid restriction (less than 2 L a day). Instructed to call the HF clinic if weight increases more than 3 lbs overnight or 5 lbs in a week. ] 2. CKD- renal function now normal. [facility]  3. CAD S/P PCI with DES to prox RCA and stent to distal RCA in 2012 . No evidence of ischemia.  4. History of liver failure and cirrhosis. Treated at [facility]---> thought to be from cardiomyopathy 5. SVT - continue amio 200 daily.   Follow up in 1-2 months  Arvilla Meresaniel Theona Muhs MD 12:27 PM

## 2014-08-09 NOTE — Patient Instructions (Signed)
Stop Metoprolol   Start Carvedilol 3.125 mg Twice daily   Increase Hydralazine to 50 mg Twice daily   Your physician recommends that you schedule a follow-up appointment in: 1 month

## 2014-08-10 ENCOUNTER — Telehealth (HOSPITAL_COMMUNITY): Payer: Self-pay

## 2014-08-10 ENCOUNTER — Encounter: Payer: Self-pay | Admitting: *Deleted

## 2014-08-10 NOTE — Telephone Encounter (Signed)
Both PICC line ports clotted and unable to pull back blood return.  Verbal order given to admin cathflow to both PICC lines to obtain blood/lab work.

## 2014-08-13 ENCOUNTER — Telehealth (HOSPITAL_COMMUNITY): Payer: Self-pay | Admitting: *Deleted

## 2014-08-13 MED ORDER — POTASSIUM CHLORIDE CRYS ER 20 MEQ PO TBCR: 20.0000 meq | EXTENDED_RELEASE_TABLET | ORAL | Status: DC

## 2014-08-13 NOTE — Telephone Encounter (Signed)
[facility] called to let us know pt's wt was up, called and spoke w/pt her wt is 176 today, usually runs around 169, she states yesterday she took an extra lasix and her wt is down a little from yesterday.  She denies edema but states she is a little more SOB with activity.  Also received labs from [facility] k 4.0 bun 32, cr 1.36 discussed all with Ulla Potash, NP will have pt take lasix 40 mg Twice daily today, sat and sun along with kcl 20 meq pt is aware and agreeable if wt not down and sob not improved by Monday she will call us back, [facility] [place] is getting weekly labs

## 2014-08-25 ENCOUNTER — Telehealth (HOSPITAL_COMMUNITY): Payer: Self-pay | Admitting: Anesthesiology

## 2014-08-25 NOTE — Telephone Encounter (Signed)
Reviewed labs:  K+ 4.5, BUN 35, creatinine 1.41  Creatinine has been trending up. Will hold lasix for 2 days. Left VM.  Ulla Potash B 4:40 PM

## 2014-08-26 NOTE — Telephone Encounter (Signed)
Pt aware and agreeable.  

## 2014-08-27 ENCOUNTER — Encounter: Payer: Self-pay | Admitting: Internal Medicine

## 2014-08-30 ENCOUNTER — Encounter: Payer: Self-pay | Admitting: Internal Medicine

## 2014-09-03 ENCOUNTER — Encounter: Payer: Self-pay | Admitting: Internal Medicine

## 2014-09-07 ENCOUNTER — Encounter: Payer: Self-pay | Admitting: Internal Medicine

## 2014-09-10 ENCOUNTER — Telehealth (HOSPITAL_COMMUNITY): Payer: Self-pay | Admitting: *Deleted

## 2014-09-10 NOTE — Telephone Encounter (Signed)
Received labs from [facility], K 4.3 bun 44 cr 1.76 per Ulla Potash, NP have pt stop Carvedilol for now and hold lasix for 2 days, spoke w/pt she is aware and agreeable, she c/o palps pt is sch to see [place] in clinic on Mon, will have device interrogated at that time

## 2014-09-13 ENCOUNTER — Ambulatory Visit (HOSPITAL_COMMUNITY)
Admission: RE | Admit: 2014-09-13 | Discharge: 2014-09-13 | Disposition: A | Discharge status: Home / Self Care | Payer: BC Managed Care – PPO | Source: Ambulatory Visit | Attending: Internal Medicine | Admitting: Internal Medicine

## 2014-09-13 VITALS — BP 130/64 | HR 97 | Wt 184.0 lb

## 2014-09-13 DIAGNOSIS — F411 Generalized anxiety disorder: Secondary | ICD-10-CM | POA: Insufficient documentation

## 2014-09-13 DIAGNOSIS — Z8249 Family history of ischemic heart disease and other diseases of the circulatory system: Secondary | ICD-10-CM | POA: Insufficient documentation

## 2014-09-13 DIAGNOSIS — K7460 Unspecified cirrhosis of liver: Secondary | ICD-10-CM | POA: Diagnosis not present

## 2014-09-13 DIAGNOSIS — I509 Heart failure, unspecified: Secondary | ICD-10-CM | POA: Diagnosis not present

## 2014-09-13 DIAGNOSIS — N183 Chronic kidney disease, stage 3 unspecified: Secondary | ICD-10-CM

## 2014-09-13 DIAGNOSIS — R002 Palpitations: Secondary | ICD-10-CM

## 2014-09-13 DIAGNOSIS — I5022 Chronic systolic (congestive) heart failure: Secondary | ICD-10-CM | POA: Diagnosis not present

## 2014-09-13 DIAGNOSIS — R0609 Other forms of dyspnea: Secondary | ICD-10-CM | POA: Insufficient documentation

## 2014-09-13 DIAGNOSIS — E785 Hyperlipidemia, unspecified: Secondary | ICD-10-CM | POA: Diagnosis not present

## 2014-09-13 DIAGNOSIS — M1990 Unspecified osteoarthritis, unspecified site: Secondary | ICD-10-CM | POA: Insufficient documentation

## 2014-09-13 DIAGNOSIS — D696 Thrombocytopenia, unspecified: Secondary | ICD-10-CM | POA: Diagnosis not present

## 2014-09-13 DIAGNOSIS — I252 Old myocardial infarction: Secondary | ICD-10-CM | POA: Diagnosis not present

## 2014-09-13 DIAGNOSIS — M109 Gout, unspecified: Secondary | ICD-10-CM | POA: Diagnosis not present

## 2014-09-13 DIAGNOSIS — I498 Other specified cardiac arrhythmias: Secondary | ICD-10-CM | POA: Insufficient documentation

## 2014-09-13 DIAGNOSIS — I959 Hypotension, unspecified: Secondary | ICD-10-CM | POA: Insufficient documentation

## 2014-09-13 DIAGNOSIS — R0989 Other specified symptoms and signs involving the circulatory and respiratory systems: Secondary | ICD-10-CM | POA: Insufficient documentation

## 2014-09-13 DIAGNOSIS — D509 Iron deficiency anemia, unspecified: Secondary | ICD-10-CM | POA: Insufficient documentation

## 2014-09-13 DIAGNOSIS — K219 Gastro-esophageal reflux disease without esophagitis: Secondary | ICD-10-CM | POA: Diagnosis not present

## 2014-09-13 DIAGNOSIS — I4949 Other premature depolarization: Secondary | ICD-10-CM | POA: Diagnosis not present

## 2014-09-13 DIAGNOSIS — R011 Cardiac murmur, unspecified: Secondary | ICD-10-CM | POA: Insufficient documentation

## 2014-09-13 DIAGNOSIS — Z8673 Personal history of transient ischemic attack (TIA), and cerebral infarction without residual deficits: Secondary | ICD-10-CM | POA: Diagnosis not present

## 2014-09-13 DIAGNOSIS — J45909 Unspecified asthma, uncomplicated: Secondary | ICD-10-CM | POA: Insufficient documentation

## 2014-09-13 DIAGNOSIS — Z87891 Personal history of nicotine dependence: Secondary | ICD-10-CM | POA: Diagnosis not present

## 2014-09-13 DIAGNOSIS — R42 Dizziness and giddiness: Secondary | ICD-10-CM | POA: Diagnosis present

## 2014-09-13 DIAGNOSIS — IMO0002 Reserved for concepts with insufficient information to code with codable children: Secondary | ICD-10-CM

## 2014-09-13 DIAGNOSIS — I129 Hypertensive chronic kidney disease with stage 1 through stage 4 chronic kidney disease, or unspecified chronic kidney disease: Secondary | ICD-10-CM | POA: Diagnosis not present

## 2014-09-13 DIAGNOSIS — M1710 Unilateral primary osteoarthritis, unspecified knee: Secondary | ICD-10-CM | POA: Diagnosis not present

## 2014-09-13 DIAGNOSIS — G43909 Migraine, unspecified, not intractable, without status migrainosus: Secondary | ICD-10-CM | POA: Insufficient documentation

## 2014-09-13 DIAGNOSIS — F319 Bipolar disorder, unspecified: Secondary | ICD-10-CM | POA: Diagnosis not present

## 2014-09-13 DIAGNOSIS — E119 Type 2 diabetes mellitus without complications: Secondary | ICD-10-CM | POA: Diagnosis not present

## 2014-09-13 DIAGNOSIS — E669 Obesity, unspecified: Secondary | ICD-10-CM | POA: Diagnosis not present

## 2014-09-13 DIAGNOSIS — I2510 Atherosclerotic heart disease of native coronary artery without angina pectoris: Secondary | ICD-10-CM | POA: Diagnosis not present

## 2014-09-13 DIAGNOSIS — I270 Primary pulmonary hypertension: Secondary | ICD-10-CM | POA: Diagnosis not present

## 2014-09-13 DIAGNOSIS — Z79899 Other long term (current) drug therapy: Secondary | ICD-10-CM | POA: Insufficient documentation

## 2014-09-13 DIAGNOSIS — I2589 Other forms of chronic ischemic heart disease: Secondary | ICD-10-CM | POA: Insufficient documentation

## 2014-09-13 DIAGNOSIS — I951 Orthostatic hypotension: Secondary | ICD-10-CM

## 2014-09-13 MED ORDER — FUROSEMIDE 40 MG PO TABS: 40.0000 mg | ORAL_TABLET | ORAL | Status: DC

## 2014-09-13 NOTE — Progress Notes (Addendum)
Patient ID: Andrea [name], female   DOB: 19-Jan-1951, 63 y.o.   MRN: [id number]  PCP: Dr. Elias Else  [facility])   HPI: Andrea [name] is a 63 y.o. female with a history of CAD DES to prox RCA and stent to dist RCA in 2012, chronic systolic heart failure, ICM s/p ICD placement, liver failure thougth to be from cardiomyopathy -seen at [facility], CVA intolerant aspirin , CKD, GERD, and DM.  Admitted 03/08/14 with low output HF. Admitting creatinine was 1.3 but increased to 4.49.  She was placed on Milrinone and diuresed with IV lasix. Milrinone was weaned off but restarted due to mixed venous saturation 39%. At that point she was discharged on Milrinone at 0.125 mcg via PICC. Discharge creatinine was 2.0. Discharge weight was 176 pounds.  Milrinone subsequently titrated to 0.375 mcg/kg/min  R/LHC 04/12/14: RA = 10  RV = 60/4/8  PA = 60/30 (42)  PCWP = 25  Fick CO/CI = 3.6/2.1  Thermo CO/CI = 2.9/1.6  PVR = 5.8  SVR = 1,911  Ao sat = 91%  PA sats = 46%, 49% 1. Nonobstructive CAD with patent stent RCA  ECHO 03/11/14 EF 15% RV ok  ECHO 08/09/14 Bedside EF 30-35%  Follow up for Heart Failure: She returns for follow up with her partner.  Last visit lopressor was stopped and she was started on 3.125 mg carvedilol twice a day and hydralazine was increased to 50 mg tid. However due to elevated creatinine coreg was stopped and lasix was held for 2 days on September 11. She did restart carvedilol today. Over the weekend she had more palpitations.   She remains of 0.375 mcg of milrinone. They do not want LVAD but would like to consider TX. [facility] following for home milrinone and weekly bmet. Taking all medications. Weight at home 179-181 pounds. + bendopnea. Mild dyspnea with exertion. Complaining of dizziness when standing. When she stands. Good appetite. Following low salt diet and limiting fluid intake to < 2 liters per day.    Labs 03/29/14; Cr 1.3 k 4.1 co-ox 76%          04/20/14: K+ 5.3,  creatinine 1.15          04/26/14: K 5.1 Cr 1.01          06/01/14:   K 4.0, creatinine 1.28          07/20/14: K 3.5 Cr 1.16          08/25/14 J 4,5 Creatinine 1.4          09/07/14 K 4.3 Creatinine 1.76 Magnesium 2.2  Coreg stopped and lasix was help for 2 days.    SH: Lives with partner, Andrea [name] and 10 year old son. She does not drive. Quit smoking 25 years ago. She does not drink alcohol  FH:   Mom Heart Failure          Father MI          Brother Heart Disease     ROS: All systems negative except as listed in HPI, PMH and Problem List.  Past Medical History  Diagnosis Date  . Chronic systolic heart failure   . Hypertension   . Stroke 02/2010    left brain CVA? recurrent TIA's treated with TPA  . Dyslipidemia   . Premature ventricular contractions (PVCs) (VPCs)   . GERD (gastroesophageal reflux disease)   . Coronary artery disease     a. DES-dRCA 11/2010. b. DES-pRCA 04/2011 (in  an area free of disease on prior cath). c. s/p DES-mRCA 03/2013 for NSTEMI.  . Ischemic cardiomyopathy     a. Hx of medtronic ICD. b. EF 15-20% by cath 03/2013.  . Pulmonary hypertension   . Gout   . Obesity   . Thrombocytopenia   . Depression   . Hypotension     a. Admission 09/2012 for hypotn & ARF. b. Meds held 03/2013 due to hypotension.  . Acute renal insufficiency     a. 09/2012. b. Also noted post-cath 03/2013.  . NSTEMI (non-ST elevated myocardial infarction) 03/2013  . Anginal pain   . Asthma   . Microcytic anemia     a. Noted 03/2013 on labs, iron studies WNL.  Marland Kitchen Anxiety   . CHF (congestive heart failure)   . CKD (chronic kidney disease) stage 3, GFR 30-59 ml/min   . HTN (hypertension)   . Primary pulmonary HTN   . Automatic implantable cardiac defibrillator - Medtronic 08/24/2013  . Heart murmur   . Type II diabetes mellitus   . Migraine headache     "no pain; aura/visual problems only; at least 2-3X/month" (01/18/2014)  . Arthritis     "knees" (01/18/2014)  . DJD (degenerative joint  disease)   . Bipolar disorder     Current Outpatient Prescriptions  Medication Sig Dispense Refill  . amiodarone (PACERONE) 200 MG tablet Take 1 tablet (200 mg total) by mouth daily.  90 tablet  3  . carvedilol (COREG) 3.125 MG tablet Take 3.125 mg by mouth 2 (two) times daily with a meal.      . diclofenac sodium (VOLTAREN) 1 % GEL Apply 2 g topically 4 (four) times daily.      . DULoxetine (CYMBALTA) 60 MG capsule Take 60 mg by mouth daily.      . furosemide (LASIX) 40 MG tablet Take 1 tablet (40 mg total) by mouth daily.  90 tablet  6  . gabapentin (NEURONTIN) 100 MG capsule Take 1 capsule (100 mg total) by mouth 2 (two) times daily.  60 capsule  0  . hydrALAZINE (APRESOLINE) 50 MG tablet Take 1 tablet (50 mg total) by mouth 2 (two) times daily.  60 tablet  6  . magnesium oxide (MAG-OX) 400 (241.3 MG) MG tablet Take 1 tablet (400 mg total) by mouth 3 (three) times daily.  90 tablet  6  . milrinone (PRIMACOR) 20 MG/100ML SOLN infusion Inject 30.225 mcg/min into the vein continuous.  100 mL    . nitroGLYCERIN (NITROSTAT) 0.4 MG SL tablet Place 0.4 mg under the tongue every 5 (five) minutes as needed for chest pain.      Marland Kitchen omeprazole (PRILOSEC) 20 MG capsule Take 20 mg by mouth daily.      . potassium chloride SA (K-DUR,KLOR-CON) 20 MEQ tablet Take 1 tablet (20 mEq total) by mouth as directed. When you take extra lasix  30 tablet  3   No current facility-administered medications for this encounter.    Filed Vitals:   09/13/14 1512  BP: 130/64  Pulse: 97  Weight: 184 lb (83.462 kg)  SpO2: 97%   PHYSICAL EXAM: General:  No acute distress. No resp difficulty. [name] present HEENT: normal Neck: supple. JVP 6-7. Carotids 2+ bilaterally; no bruits. No lymphadenopathy or thryomegaly appreciated. Cor: PMI normal.  Regular rate & rhythm. No rubs, or murmurs. No S3  Lungs: clear Abdomen: soft, nontender, nondistended. No hepatosplenomegaly. No bruits or masses. Good bowel  sounds. Extremities: no cyanosis, clubbing, rash, edema, RUE  2 lumen PICC Neuro: alert & orientedx3, cranial nerves grossly intact. Moves all 4 extremities w/o difficulty. Affect pleasant   ASSESSMENT & PLAN:  Sitting. 119/64 Standing 101/64   1. Chronic Systolic HF, ICM s/p ICD; EF 15% RV ok (03/11/14). 07/2014 ECHO EF ~ 30-35% on milrinone  - NYHA III. Doing ok but with fatigue and dizziness. - Volume status low. As above she is orthostatic. Change lasix to 40 mg every other day and she will hold lasix until Friday.    - Not interested in VAD. Would consider transplant but wants to just continue milrinone for now. (We discussed referral to [facility] for Tx eval but she wants to hold off - Stop carvedilol due to profound fatigue. -Continue hydralazine to 50 bid unable to take tid or to tolerate IMDUR due to severe HAs in setting of known migraines.  - Not on ACE-I with h/o hyperkalemia and CKD.  - Reinforced the need and importance of daily weights, a low sodium diet, and fluid restriction (less than 2 L a day). Instructed to call the HF clinic if weight increases more than 3 lbs overnight or 5 lbs in a week. ] 2. CKD- renal function now normal. BMET per [facility] weekly. Has follow up with Dr Darrick Penna.  3. CAD S/P PCI with DES to prox RCA and stent to distal RCA in 2012 . No evidence of ischemia.  4. History of liver failure and cirrhosis. Treated at [facility]---> thought to be from cardiomyopathy 5. SVT - continue amio 200 daily. No tachycardiac.  6. Palpitations-  Medtronic Interrogated. No events noted.   Follow up in 1 month.   Milan Clare NP-C  3:18 PM

## 2014-09-13 NOTE — Patient Instructions (Addendum)
Follow up in 1 month  Stop carvedilol   Hold lasix until Friday then taken 40 mg every Mon-Wed-Fri  Do the following things EVERYDAY: 1) Weigh yourself in the morning before breakfast. Write it down and keep it in a log. 2) Take your medicines as prescribed 3) Eat low salt foods-Limit salt (sodium) to 2000 mg per day.  4) Stay as active as you can everyday 5) Limit all fluids for the day to less than 2 liters

## 2014-09-15 ENCOUNTER — Telehealth (HOSPITAL_COMMUNITY): Payer: Self-pay | Admitting: Vascular Surgery

## 2014-09-15 NOTE — Telephone Encounter (Signed)
Vanessa called .Marland Kitchen Pt bp 164/101 hr 107 on tele monitoring pt did not take her bp medicine she will retake bp later. Pt Coreg was d/c on her last visit and Lasix was cut to 3 times a day.Erie Noe would like a follow up call with any instructions . Please advise

## 2014-09-16 NOTE — Telephone Encounter (Signed)
Patient to resume lasix as prescribed tomorrow (was on hold from previous clinic visit), will have patient start this back and reevaluate weight gain. Ave Filter

## 2014-09-17 ENCOUNTER — Encounter: Payer: Self-pay | Admitting: Internal Medicine

## 2014-09-21 ENCOUNTER — Encounter: Payer: Self-pay | Admitting: Internal Medicine

## 2014-09-23 ENCOUNTER — Encounter: Payer: Self-pay | Admitting: Internal Medicine

## 2014-10-05 ENCOUNTER — Encounter: Payer: BC Managed Care – PPO | Admitting: Internal Medicine

## 2014-10-06 ENCOUNTER — Encounter: Payer: Self-pay | Admitting: Internal Medicine

## 2014-10-11 ENCOUNTER — Telehealth (HOSPITAL_COMMUNITY): Payer: Self-pay

## 2014-10-11 ENCOUNTER — Ambulatory Visit (HOSPITAL_COMMUNITY)
Admission: RE | Admit: 2014-10-11 | Discharge: 2014-10-11 | Disposition: A | Discharge status: Home / Self Care | Payer: BC Managed Care – PPO | Source: Ambulatory Visit | Attending: Cardiology | Admitting: Cardiology

## 2014-10-11 ENCOUNTER — Encounter (HOSPITAL_COMMUNITY): Payer: Self-pay

## 2014-10-11 VITALS — BP 143/63 | HR 106 | Resp 18 | Wt 188.2 lb

## 2014-10-11 DIAGNOSIS — I252 Old myocardial infarction: Secondary | ICD-10-CM | POA: Diagnosis not present

## 2014-10-11 DIAGNOSIS — K219 Gastro-esophageal reflux disease without esophagitis: Secondary | ICD-10-CM | POA: Insufficient documentation

## 2014-10-11 DIAGNOSIS — I129 Hypertensive chronic kidney disease with stage 1 through stage 4 chronic kidney disease, or unspecified chronic kidney disease: Secondary | ICD-10-CM | POA: Diagnosis not present

## 2014-10-11 DIAGNOSIS — E119 Type 2 diabetes mellitus without complications: Secondary | ICD-10-CM | POA: Insufficient documentation

## 2014-10-11 DIAGNOSIS — Z955 Presence of coronary angioplasty implant and graft: Secondary | ICD-10-CM | POA: Diagnosis not present

## 2014-10-11 DIAGNOSIS — Z79899 Other long term (current) drug therapy: Secondary | ICD-10-CM | POA: Diagnosis not present

## 2014-10-11 DIAGNOSIS — I5022 Chronic systolic (congestive) heart failure: Secondary | ICD-10-CM | POA: Diagnosis not present

## 2014-10-11 DIAGNOSIS — I471 Supraventricular tachycardia: Secondary | ICD-10-CM | POA: Insufficient documentation

## 2014-10-11 DIAGNOSIS — N183 Chronic kidney disease, stage 3 (moderate): Secondary | ICD-10-CM | POA: Insufficient documentation

## 2014-10-11 DIAGNOSIS — I2510 Atherosclerotic heart disease of native coronary artery without angina pectoris: Secondary | ICD-10-CM | POA: Diagnosis not present

## 2014-10-11 DIAGNOSIS — I10 Essential (primary) hypertension: Secondary | ICD-10-CM

## 2014-10-11 DIAGNOSIS — Z95810 Presence of automatic (implantable) cardiac defibrillator: Secondary | ICD-10-CM | POA: Diagnosis not present

## 2014-10-11 DIAGNOSIS — Z8673 Personal history of transient ischemic attack (TIA), and cerebral infarction without residual deficits: Secondary | ICD-10-CM | POA: Insufficient documentation

## 2014-10-11 DIAGNOSIS — I255 Ischemic cardiomyopathy: Secondary | ICD-10-CM | POA: Insufficient documentation

## 2014-10-11 MED ORDER — HYDRALAZINE HCL 50 MG PO TABS: 50.0000 mg | ORAL_TABLET | Freq: Three times a day (TID) | ORAL | Status: DC

## 2014-10-11 NOTE — Telephone Encounter (Signed)
error 

## 2014-10-11 NOTE — Telephone Encounter (Signed)
Patient has appoitnemtn with [place] this afternoon, she would rather come to [place] first before going to ED.  Asked her to come a little earlier than scheduled time.  Aware and agreeable. Ave Filter

## 2014-10-11 NOTE — Progress Notes (Signed)
Patient ID: Andrea HeadingsCorinne G [name], female   DOB: [birth year], 63 y.o.   MRN: [id number]  PCP: Dr. Elias Elseobert [name]  [facility])  HPI: Andrea [name] is a 63 y.o. female with a history of CAD DES to prox RCA and stent to dist RCA in 2012, chronic systolic heart failure, ICM s/p ICD placement, liver failure thougth to be from cardiomyopathy -[facility], CVA intolerant aspirin , CKD, GERD, and DM.  Admitted 03/08/14 with low output HF. Admitting creatinine was 1.3 but increased to 4.49.  She was placed on Milrinone and diuresed with IV lasix. Milrinone was weaned off but restarted due to mixed venous saturation 39%. At that point she was discharged on Milrinone at 0.125 mcg via PICC. Discharge creatinine was 2.0. Discharge weight was 176 pounds.  Milrinone subsequently titrated to 0.375 mcg/kg/min  Follow up for Heart Failure: She returns for follow up with her partner.  Last visit carvedilol was stopped due to profound fatigue and lasix was cut back to three times a week due to orthostatic hypotension.  Complains of intermittent dizziness. Mild dyspnea with exertion but she is ok if she takes her time. Weight at home 181-184 pounds. SBP at home 120-130s. Taking all medications.    R/LHC 04/12/14: RA = 10  RV = 60/4/8  PA = 60/30 (42)  PCWP = 25  Fick CO/CI = 3.6/2.1  Thermo CO/CI = 2.9/1.6  PVR = 5.8  SVR = 1,911  Ao sat = 91%  PA sats = 46%, 49% 1. Nonobstructive CAD with patent stent RCA  ECHO 03/11/14 EF 15% RV ok  ECHO 08/09/14 Bedside EF 30-35%  Labs 03/29/14; Cr 1.3 k 4.1 co-ox 76%          04/20/14: K+ 5.3, creatinine 1.15          04/26/14: K 5.1 Cr 1.01          06/01/14:   K 4.0, creatinine 1.28          07/20/14: K 3.5 Cr 1.16          08/25/14 J 4,5 Creatinine 1.4          09/07/14 K 4.3 Creatinine 1.76 Magnesium 2.2  Coreg stopped and lasix was help for 2 days.    SH: Lives with partner, Andrea [name] and 63 year old son. She does not drive. Quit smoking 25 years ago. She does not  drink alcohol  FH:   Mom Heart Failure          Father MI          Brother Heart Disease     ROS: All systems negative except as listed in HPI, PMH and Problem List.  Past Medical History  Diagnosis Date  . Chronic systolic heart failure   . Hypertension   . Stroke 02/2010    left brain CVA? recurrent TIA's treated with TPA  . Dyslipidemia   . Premature ventricular contractions (PVCs) (VPCs)   . GERD (gastroesophageal reflux disease)   . Coronary artery disease     a. DES-dRCA 11/2010. b. DES-pRCA 04/2011 (in an area free of disease on prior cath). c. s/p DES-mRCA 03/2013 for NSTEMI.  . Ischemic cardiomyopathy     a. Hx of medtronic ICD. b. EF 15-20% by cath 03/2013.  . Pulmonary hypertension   . Gout   . Obesity   . Thrombocytopenia   . Depression   . Hypotension     a. Admission 09/2012 for hypotn & ARF.  b. Meds held 03/2013 due to hypotension.  . Acute renal insufficiency     a. 09/2012. b. Also noted post-cath 03/2013.  . NSTEMI (non-ST elevated myocardial infarction) 03/2013  . Anginal pain   . Asthma   . Microcytic anemia     a. Noted 03/2013 on labs, iron studies WNL.  Marland Kitchen Anxiety   . CHF (congestive heart failure)   . CKD (chronic kidney disease) stage 3, GFR 30-59 ml/min   . HTN (hypertension)   . Primary pulmonary HTN   . Automatic implantable cardiac defibrillator - Medtronic 08/24/2013  . Heart murmur   . Type II diabetes mellitus   . Migraine headache     "no pain; aura/visual problems only; at least 2-3X/month" (01/18/2014)  . Arthritis     "knees" (01/18/2014)  . DJD (degenerative joint disease)   . Bipolar disorder     Current Outpatient Prescriptions  Medication Sig Dispense Refill  . amiodarone (PACERONE) 200 MG tablet Take 1 tablet (200 mg total) by mouth daily.  90 tablet  3  . diclofenac sodium (VOLTAREN) 1 % GEL Apply 2 g topically 4 (four) times daily.      . DULoxetine (CYMBALTA) 60 MG capsule Take 60 mg by mouth daily.      . furosemide (LASIX) 40  MG tablet Take 1 tablet (40 mg total) by mouth 3 (three) times a week. Mon-Wed-Fri  30 tablet  6  . gabapentin (NEURONTIN) 100 MG capsule Take 1 capsule (100 mg total) by mouth 2 (two) times daily.  60 capsule  0  . hydrALAZINE (APRESOLINE) 50 MG tablet Take 1 tablet (50 mg total) by mouth 2 (two) times daily.  60 tablet  6  . magnesium oxide (MAG-OX) 400 (241.3 MG) MG tablet Take 1 tablet (400 mg total) by mouth 3 (three) times daily.  90 tablet  6  . milrinone (PRIMACOR) 20 MG/100ML SOLN infusion Inject 30.225 mcg/min into the vein continuous.  100 mL    . nitroGLYCERIN (NITROSTAT) 0.4 MG SL tablet Place 0.4 mg under the tongue every 5 (five) minutes as needed for chest pain.      Marland Kitchen omeprazole (PRILOSEC) 20 MG capsule Take 20 mg by mouth daily.      . potassium chloride SA (K-DUR,KLOR-CON) 20 MEQ tablet Take 1 tablet (20 mEq total) by mouth as directed. When you take extra lasix  30 tablet  3   No current facility-administered medications for this encounter.    Filed Vitals:   10/11/14 1415  BP: 143/63  Pulse: 106  Resp: 18  Weight: 188 lb 4 oz (85.39 kg)  SpO2: 96%   PHYSICAL EXAM: General:  No acute distress. No resp difficulty. [name] present HEENT: normal Neck: supple. JVP 6-7. Carotids 2+ bilaterally; no bruits. No lymphadenopathy or thryomegaly appreciated. Cor: PMI normal.  Regular rate & rhythm. No rubs, or murmurs. No S3  Lungs: clear Abdomen: soft, nontender, nondistended. No hepatosplenomegaly. No bruits or masses. Good bowel sounds. Extremities: no cyanosis, clubbing, rash, edema, RUE 2 lumen PICC Neuro: alert & orientedx3, cranial nerves grossly intact. Moves all 4 extremities w/o difficulty. Affect pleasant   ASSESSMENT & PLAN:   1. Chronic Systolic HF, ICM s/p ICD; EF 15% RV ok (03/11/14). 07/2014 ECHO EF ~ 30-35% on milrinone  - NYHA III. Doing ok but with fatigue and dizziness. - Volume status stable. Change lasix to 40 mg every other day.     - Not interested  in VAD. Would consider transplant but  wants to just continue milrinone for now. (We discussed referral to [facility] for Tx eval but she wants to hold off - Off carvedilol due to profound fatigue. -Increase  hydralazine to 50 mg to tid. She is going try and take 3 times a day, Inttolerant IMDUR due to severe HAs in setting of known migraines.  - Not on ACE-I with h/o hyperkalemia and CKD.  - Reinforced the need and importance of daily weights, a low sodium diet, and fluid restriction (less than 2 L a day). Instructed to call the HF clinic if weight increases more than 3 lbs overnight or 5 lbs in a week. ] 2. CKD- renal function now normal. BMET per [facility] weekly. Has follow up with Dr Darrick Penna.  3. CAD S/P PCI with DES to prox RCA and stent to distal RCA in 2012 . No evidence of ischemia.  4. History of liver failure and cirrhosis. Treated at [facility]---> thought to be from cardiomyopathy 5. SVT - continue amio 200 daily. 6. Palpitations-  Medtronic Interrogated. No events noted.  7. DM - Managed by PCP. Started on Januvia she is unsure fo the dose.   Follow up in 1 month.   CLEGG,AMY NP-C  2:29 PM

## 2014-10-11 NOTE — Patient Instructions (Signed)
Follow up in 1 month  Take hydralazine 50 mg three times a day  Do the following things EVERYDAY: 1) Weigh yourself in the morning before breakfast. Write it down and keep it in a log. 2) Take your medicines as prescribed 3) Eat low salt foods-Limit salt (sodium) to 2000 mg per day.  4) Stay as active as you can everyday 5) Limit all fluids for the day to less than 2 liters 

## 2014-10-21 ENCOUNTER — Encounter: Payer: Self-pay | Admitting: Pediatrics

## 2014-10-28 ENCOUNTER — Encounter: Payer: BC Managed Care – PPO | Admitting: Internal Medicine

## 2014-10-28 ENCOUNTER — Telehealth (HOSPITAL_COMMUNITY): Payer: Self-pay | Admitting: *Deleted

## 2014-10-28 NOTE — Telephone Encounter (Signed)
Received labs from [facility], bun 45 cr 1.62 per Ulla Potash, NP have pt hold lasix x1 day, pt aware and agreeable and skip tomorrows dose

## 2014-11-03 ENCOUNTER — Encounter: Payer: Self-pay | Admitting: Internal Medicine

## 2014-11-08 ENCOUNTER — Other Ambulatory Visit: Payer: Self-pay

## 2014-11-08 ENCOUNTER — Ambulatory Visit (HOSPITAL_BASED_OUTPATIENT_CLINIC_OR_DEPARTMENT_OTHER)
Admission: RE | Admit: 2014-11-08 | Discharge: 2014-11-08 | Disposition: A | Discharge status: Home / Self Care | Payer: BC Managed Care – PPO | Source: Ambulatory Visit | Attending: Internal Medicine | Admitting: Internal Medicine

## 2014-11-08 ENCOUNTER — Inpatient Hospital Stay (HOSPITAL_COMMUNITY): Payer: BC Managed Care – PPO

## 2014-11-08 ENCOUNTER — Inpatient Hospital Stay (HOSPITAL_COMMUNITY)
Admission: EM | Admit: 2014-11-08 | Discharge: 2014-11-10 | DRG: 389 | Disposition: A | Discharge status: Home Health | Payer: BC Managed Care – PPO | Attending: Internal Medicine | Admitting: Internal Medicine

## 2014-11-08 ENCOUNTER — Emergency Department (HOSPITAL_COMMUNITY): Payer: BC Managed Care – PPO

## 2014-11-08 ENCOUNTER — Encounter (HOSPITAL_COMMUNITY): Payer: Self-pay | Admitting: Emergency Medicine

## 2014-11-08 VITALS — BP 154/82 | HR 125 | Temp 101.1°F | Wt 187.8 lb

## 2014-11-08 DIAGNOSIS — F329 Major depressive disorder, single episode, unspecified: Secondary | ICD-10-CM | POA: Diagnosis present

## 2014-11-08 DIAGNOSIS — R531 Weakness: Secondary | ICD-10-CM

## 2014-11-08 DIAGNOSIS — E119 Type 2 diabetes mellitus without complications: Secondary | ICD-10-CM | POA: Diagnosis present

## 2014-11-08 DIAGNOSIS — Z95810 Presence of automatic (implantable) cardiac defibrillator: Secondary | ICD-10-CM | POA: Diagnosis not present

## 2014-11-08 DIAGNOSIS — N289 Disorder of kidney and ureter, unspecified: Secondary | ICD-10-CM

## 2014-11-08 DIAGNOSIS — I252 Old myocardial infarction: Secondary | ICD-10-CM | POA: Diagnosis not present

## 2014-11-08 DIAGNOSIS — I951 Orthostatic hypotension: Secondary | ICD-10-CM

## 2014-11-08 DIAGNOSIS — N183 Chronic kidney disease, stage 3 (moderate): Secondary | ICD-10-CM | POA: Diagnosis present

## 2014-11-08 DIAGNOSIS — K5669 Other intestinal obstruction: Secondary | ICD-10-CM

## 2014-11-08 DIAGNOSIS — E785 Hyperlipidemia, unspecified: Secondary | ICD-10-CM | POA: Diagnosis present

## 2014-11-08 DIAGNOSIS — Z8673 Personal history of transient ischemic attack (TIA), and cerebral infarction without residual deficits: Secondary | ICD-10-CM | POA: Diagnosis not present

## 2014-11-08 DIAGNOSIS — K219 Gastro-esophageal reflux disease without esophagitis: Secondary | ICD-10-CM | POA: Diagnosis present

## 2014-11-08 DIAGNOSIS — I5022 Chronic systolic (congestive) heart failure: Secondary | ICD-10-CM | POA: Diagnosis present

## 2014-11-08 DIAGNOSIS — R12 Heartburn: Secondary | ICD-10-CM

## 2014-11-08 DIAGNOSIS — K7290 Hepatic failure, unspecified without coma: Secondary | ICD-10-CM

## 2014-11-08 DIAGNOSIS — Z87891 Personal history of nicotine dependence: Secondary | ICD-10-CM

## 2014-11-08 DIAGNOSIS — E162 Hypoglycemia, unspecified: Secondary | ICD-10-CM

## 2014-11-08 DIAGNOSIS — I2510 Atherosclerotic heart disease of native coronary artery without angina pectoris: Secondary | ICD-10-CM | POA: Diagnosis present

## 2014-11-08 DIAGNOSIS — R34 Anuria and oliguria: Secondary | ICD-10-CM

## 2014-11-08 DIAGNOSIS — I272 Other secondary pulmonary hypertension: Secondary | ICD-10-CM | POA: Diagnosis present

## 2014-11-08 DIAGNOSIS — E860 Dehydration: Secondary | ICD-10-CM

## 2014-11-08 DIAGNOSIS — I255 Ischemic cardiomyopathy: Secondary | ICD-10-CM | POA: Diagnosis present

## 2014-11-08 DIAGNOSIS — R6510 Systemic inflammatory response syndrome (SIRS) of non-infectious origin without acute organ dysfunction: Secondary | ICD-10-CM

## 2014-11-08 DIAGNOSIS — J189 Pneumonia, unspecified organism: Secondary | ICD-10-CM

## 2014-11-08 DIAGNOSIS — Z955 Presence of coronary angioplasty implant and graft: Secondary | ICD-10-CM | POA: Diagnosis not present

## 2014-11-08 DIAGNOSIS — R42 Dizziness and giddiness: Secondary | ICD-10-CM

## 2014-11-08 DIAGNOSIS — K567 Ileus, unspecified: Secondary | ICD-10-CM | POA: Insufficient documentation

## 2014-11-08 DIAGNOSIS — J90 Pleural effusion, not elsewhere classified: Secondary | ICD-10-CM

## 2014-11-08 DIAGNOSIS — K56600 Partial intestinal obstruction, unspecified as to cause: Secondary | ICD-10-CM

## 2014-11-08 DIAGNOSIS — R112 Nausea with vomiting, unspecified: Secondary | ICD-10-CM

## 2014-11-08 DIAGNOSIS — I129 Hypertensive chronic kidney disease with stage 1 through stage 4 chronic kidney disease, or unspecified chronic kidney disease: Secondary | ICD-10-CM | POA: Diagnosis present

## 2014-11-08 DIAGNOSIS — G9340 Encephalopathy, unspecified: Secondary | ICD-10-CM

## 2014-11-08 DIAGNOSIS — Z95828 Presence of other vascular implants and grafts: Secondary | ICD-10-CM

## 2014-11-08 DIAGNOSIS — R7989 Other specified abnormal findings of blood chemistry: Secondary | ICD-10-CM

## 2014-11-08 DIAGNOSIS — R0989 Other specified symptoms and signs involving the circulatory and respiratory systems: Secondary | ICD-10-CM

## 2014-11-08 DIAGNOSIS — I493 Ventricular premature depolarization: Secondary | ICD-10-CM

## 2014-11-08 DIAGNOSIS — J9601 Acute respiratory failure with hypoxia: Secondary | ICD-10-CM

## 2014-11-08 DIAGNOSIS — A419 Sepsis, unspecified organism: Secondary | ICD-10-CM

## 2014-11-08 DIAGNOSIS — I429 Cardiomyopathy, unspecified: Secondary | ICD-10-CM

## 2014-11-08 DIAGNOSIS — I5023 Acute on chronic systolic (congestive) heart failure: Secondary | ICD-10-CM

## 2014-11-08 DIAGNOSIS — K56609 Unspecified intestinal obstruction, unspecified as to partial versus complete obstruction: Secondary | ICD-10-CM

## 2014-11-08 DIAGNOSIS — N179 Acute kidney failure, unspecified: Secondary | ICD-10-CM

## 2014-11-08 DIAGNOSIS — I214 Non-ST elevation (NSTEMI) myocardial infarction: Secondary | ICD-10-CM

## 2014-11-08 DIAGNOSIS — R0600 Dyspnea, unspecified: Secondary | ICD-10-CM

## 2014-11-08 DIAGNOSIS — I5021 Acute systolic (congestive) heart failure: Secondary | ICD-10-CM

## 2014-11-08 DIAGNOSIS — I420 Dilated cardiomyopathy: Secondary | ICD-10-CM

## 2014-11-08 DIAGNOSIS — Z79899 Other long term (current) drug therapy: Secondary | ICD-10-CM

## 2014-11-08 DIAGNOSIS — R7881 Bacteremia: Secondary | ICD-10-CM

## 2014-11-08 DIAGNOSIS — R002 Palpitations: Secondary | ICD-10-CM

## 2014-11-08 DIAGNOSIS — R000 Tachycardia, unspecified: Secondary | ICD-10-CM

## 2014-11-08 DIAGNOSIS — N189 Chronic kidney disease, unspecified: Secondary | ICD-10-CM

## 2014-11-08 LAB — COMPREHENSIVE METABOLIC PANEL
ALBUMIN: 3.1 g/dL — AB (ref 3.5–5.2)
ALT: 29 U/L (ref 0–35)
AST: 32 U/L (ref 0–37)
Alkaline Phosphatase: 127 U/L — ABNORMAL HIGH (ref 39–117)
Anion gap: 14 (ref 5–15)
BUN: 20 mg/dL (ref 6–23)
CALCIUM: 8.8 mg/dL (ref 8.4–10.5)
CHLORIDE: 102 meq/L (ref 96–112)
CO2: 24 mEq/L (ref 19–32)
CREATININE: 1.2 mg/dL — AB (ref 0.50–1.10)
GFR calc Af Amer: 55 mL/min — ABNORMAL LOW (ref >90)
GFR calc non Af Amer: 47 mL/min — ABNORMAL LOW (ref >90)
Glucose, Bld: 135 mg/dL — ABNORMAL HIGH (ref 70–99)
Potassium: 4.2 mEq/L (ref 3.7–5.3)
Sodium: 140 mEq/L (ref 137–147)
TOTAL PROTEIN: 7.5 g/dL (ref 6.0–8.3)
Total Bilirubin: 0.6 mg/dL (ref 0.3–1.2)

## 2014-11-08 LAB — URINALYSIS, ROUTINE W REFLEX MICROSCOPIC
Bilirubin Urine: NEGATIVE
Glucose, UA: NEGATIVE mg/dL
Hgb urine dipstick: NEGATIVE
Ketones, ur: NEGATIVE mg/dL
LEUKOCYTES UA: NEGATIVE
Nitrite: NEGATIVE
PROTEIN: 30 mg/dL — AB
Specific Gravity, Urine: 1.018 (ref 1.005–1.030)
UROBILINOGEN UA: 0.2 mg/dL (ref 0.0–1.0)
pH: 5.5 (ref 5.0–8.0)

## 2014-11-08 LAB — CBC WITH DIFFERENTIAL/PLATELET
Basophils Absolute: 0 K/uL (ref 0.0–0.1)
Basophils Relative: 0 % (ref 0–1)
Eosinophils Absolute: 0.1 K/uL (ref 0.0–0.7)
Eosinophils Relative: 1 % (ref 0–5)
HCT: 31.8 % — ABNORMAL LOW (ref 36.0–46.0)
Hemoglobin: 10.4 g/dL — ABNORMAL LOW (ref 12.0–15.0)
Lymphocytes Relative: 24 % (ref 12–46)
Lymphs Abs: 1.9 K/uL (ref 0.7–4.0)
MCH: 21.8 pg — ABNORMAL LOW (ref 26.0–34.0)
MCHC: 32.7 g/dL (ref 30.0–36.0)
MCV: 66.5 fL — ABNORMAL LOW (ref 78.0–100.0)
Monocytes Absolute: 0.7 K/uL (ref 0.1–1.0)
Monocytes Relative: 9 % (ref 3–12)
Neutro Abs: 5.2 K/uL (ref 1.7–7.7)
Neutrophils Relative %: 66 % (ref 43–77)
Platelets: 173 K/uL (ref 150–400)
RBC: 4.78 MIL/uL (ref 3.87–5.11)
RDW: 17.1 % — ABNORMAL HIGH (ref 11.5–15.5)
WBC: 7.9 K/uL (ref 4.0–10.5)

## 2014-11-08 LAB — TROPONIN I: Troponin I: <0.3 ng/mL (ref <0.30)

## 2014-11-08 LAB — CBC
HEMATOCRIT: 30.1 % — AB (ref 36.0–46.0)
HEMOGLOBIN: 9.8 g/dL — AB (ref 12.0–15.0)
MCH: 21.2 pg — ABNORMAL LOW (ref 26.0–34.0)
MCHC: 32.6 g/dL (ref 30.0–36.0)
MCV: 65 fL — ABNORMAL LOW (ref 78.0–100.0)
Platelets: 159 10*3/uL (ref 150–400)
RBC: 4.63 MIL/uL (ref 3.87–5.11)
RDW: 16.9 % — ABNORMAL HIGH (ref 11.5–15.5)
WBC: 7.4 10*3/uL (ref 4.0–10.5)

## 2014-11-08 LAB — URINE MICROSCOPIC-ADD ON

## 2014-11-08 LAB — I-STAT TROPONIN, ED: TROPONIN I, POC: 0.07 ng/mL (ref 0.00–0.08)

## 2014-11-08 LAB — CREATININE, SERUM
Creatinine, Ser: 1.41 mg/dL — ABNORMAL HIGH (ref 0.50–1.10)
GFR calc Af Amer: 45 mL/min — ABNORMAL LOW (ref >90)
GFR, EST NON AFRICAN AMERICAN: 39 mL/min — AB (ref >90)

## 2014-11-08 LAB — I-STAT CG4 LACTIC ACID, ED: LACTIC ACID, VENOUS: 0.64 mmol/L (ref 0.5–2.2)

## 2014-11-08 LAB — LIPASE, BLOOD: Lipase: 14 U/L (ref 11–59)

## 2014-11-08 LAB — MRSA PCR SCREENING: MRSA BY PCR: NEGATIVE

## 2014-11-08 LAB — GLUCOSE, CAPILLARY: Glucose-Capillary: 100 mg/dL — ABNORMAL HIGH (ref 70–99)

## 2014-11-08 LAB — CBG MONITORING, ED: GLUCOSE-CAPILLARY: 115 mg/dL — AB (ref 70–99)

## 2014-11-08 LAB — PRO B NATRIURETIC PEPTIDE: Pro B Natriuretic peptide (BNP): 2681 pg/mL — ABNORMAL HIGH (ref 0–125)

## 2014-11-08 MED ORDER — AMIODARONE HCL 200 MG PO TABS
200.0000 mg | ORAL_TABLET | Freq: Every day | ORAL | Status: DC
  Administered 2014-11-09 – 2014-11-10 (×2): 200 mg via ORAL
  Filled 2014-11-08 (×2): qty 1

## 2014-11-08 MED ORDER — PANTOPRAZOLE SODIUM 40 MG PO TBEC
40.0000 mg | DELAYED_RELEASE_TABLET | Freq: Every day | ORAL | Status: DC
  Administered 2014-11-08 – 2014-11-09 (×2): 40 mg via ORAL
  Filled 2014-11-08 (×2): qty 1

## 2014-11-08 MED ORDER — GABAPENTIN 100 MG PO CAPS
100.0000 mg | ORAL_CAPSULE | Freq: Two times a day (BID) | ORAL | Status: DC
  Administered 2014-11-08 – 2014-11-10 (×4): 100 mg via ORAL
  Filled 2014-11-08 (×5): qty 1

## 2014-11-08 MED ORDER — MILRINONE IN DEXTROSE 20 MG/100ML IV SOLN
0.3750 ug/kg/min | INTRAVENOUS | Status: DC
  Administered 2014-11-08 – 2014-11-10 (×4): 0.375 ug/kg/min via INTRAVENOUS
  Filled 2014-11-08 (×4): qty 100

## 2014-11-08 MED ORDER — PIPERACILLIN-TAZOBACTAM 3.375 G IVPB
3.3750 g | Freq: Three times a day (TID) | INTRAVENOUS | Status: DC
  Administered 2014-11-08 – 2014-11-10 (×5): 3.375 g via INTRAVENOUS
  Filled 2014-11-08 (×8): qty 50

## 2014-11-08 MED ORDER — IOHEXOL 300 MG/ML  SOLN
25.0000 mL | INTRAMUSCULAR | Status: AC
  Administered 2014-11-08 (×2): 25 mL via ORAL

## 2014-11-08 MED ORDER — SODIUM CHLORIDE 0.9 % IV SOLN
INTRAVENOUS | Status: DC
  Administered 2014-11-08: 19:00:00 via INTRAVENOUS

## 2014-11-08 MED ORDER — ONDANSETRON 4 MG PO TBDP
8.0000 mg | ORAL_TABLET | Freq: Once | ORAL | Status: AC
  Administered 2014-11-08: 8 mg via ORAL
  Filled 2014-11-08: qty 2

## 2014-11-08 MED ORDER — MAGNESIUM OXIDE 400 (241.3 MG) MG PO TABS
400.0000 mg | ORAL_TABLET | Freq: Three times a day (TID) | ORAL | Status: DC
  Administered 2014-11-08 – 2014-11-10 (×5): 400 mg via ORAL
  Filled 2014-11-08 (×7): qty 1

## 2014-11-08 MED ORDER — VANCOMYCIN HCL 500 MG IV SOLR
500.0000 mg | Freq: Two times a day (BID) | INTRAVENOUS | Status: DC
  Administered 2014-11-09 – 2014-11-10 (×3): 500 mg via INTRAVENOUS
  Filled 2014-11-08 (×4): qty 500

## 2014-11-08 MED ORDER — HEPARIN SODIUM (PORCINE) 5000 UNIT/ML IJ SOLN
5000.0000 [IU] | Freq: Three times a day (TID) | INTRAMUSCULAR | Status: DC
  Administered 2014-11-08 – 2014-11-10 (×5): 5000 [IU] via SUBCUTANEOUS
  Filled 2014-11-08 (×8): qty 1

## 2014-11-08 MED ORDER — DULOXETINE HCL 60 MG PO CPEP
60.0000 mg | ORAL_CAPSULE | Freq: Every day | ORAL | Status: DC
  Administered 2014-11-09 – 2014-11-10 (×2): 60 mg via ORAL
  Filled 2014-11-08 (×2): qty 1

## 2014-11-08 MED ORDER — NITROGLYCERIN 0.4 MG SL SUBL: 0.4000 mg | SUBLINGUAL_TABLET | SUBLINGUAL | Status: DC | PRN

## 2014-11-08 MED ORDER — SODIUM CHLORIDE 0.9 % IV SOLN: INTRAVENOUS | Status: DC

## 2014-11-08 MED ORDER — VANCOMYCIN HCL IN DEXTROSE 1-5 GM/200ML-% IV SOLN
1000.0000 mg | Freq: Once | INTRAVENOUS | Status: AC
  Administered 2014-11-09: 1000 mg via INTRAVENOUS
  Filled 2014-11-08: qty 200

## 2014-11-08 MED ORDER — ACETAMINOPHEN 325 MG PO TABS
650.0000 mg | ORAL_TABLET | Freq: Once | ORAL | Status: AC
  Administered 2014-11-08: 650 mg via ORAL
  Filled 2014-11-08: qty 2

## 2014-11-08 MED ORDER — INSULIN ASPART 100 UNIT/ML ~~LOC~~ SOLN: 0.0000 [IU] | SUBCUTANEOUS | Status: DC

## 2014-11-08 MED ORDER — CETYLPYRIDINIUM CHLORIDE 0.05 % MT LIQD
7.0000 mL | Freq: Two times a day (BID) | OROMUCOSAL | Status: DC
  Administered 2014-11-08 – 2014-11-09 (×3): 7 mL via OROMUCOSAL

## 2014-11-08 MED ORDER — INFLUENZA VAC SPLIT QUAD 0.5 ML IM SUSY
0.5000 mL | PREFILLED_SYRINGE | INTRAMUSCULAR | Status: AC
  Administered 2014-11-10: 0.5 mL via INTRAMUSCULAR
  Filled 2014-11-08: qty 0.5

## 2014-11-08 NOTE — Progress Notes (Signed)
Patient ID: Andrea HeadingsCorinne G [name], female   DOB: Nov 01, 1951, 63 y.o.   MRN: [id number]  Patient ID: Andrea HeadingsCorinne G [name], female   DOB: Nov 01, 1951, 63 y.o.   MRN: [id number]  PCP: Dr. Elias Elseobert Reade  [facility])   HPI: Andrea [name] is a 63 y.o. female with a history of CAD DES to prox RCA and stent to dist RCA in 2012, chronic systolic heart failure, ICM s/p ICD placement, liver failure thougth to be from cardiomyopathy -[facility], CVA intolerant aspirin , CKD, GERD, and DM.  Admitted 03/08/14 with low output HF. Admitting creatinine was 1.3 but increased to 4.49.  She was placed on Milrinone and diuresed with IV lasix. Milrinone was weaned off but restarted due to mixed venous saturation 39%. At that point she was discharged on Milrinone at 0.125 mcg via PICC. Discharge creatinine was 2.0. Discharge weight was 176 pounds.  Milrinone subsequently titrated to 0.375 mcg/kg/min  R/LHC 04/12/14: RA = 10  RV = 60/4/8  PA = 60/30 (42)  PCWP = 25  Fick CO/CI = 3.6/2.1  Thermo CO/CI = 2.9/1.6  PVR = 5.8  SVR = 1,911  Ao sat = 91%  PA sats = 46%, 49% 1. Nonobstructive CAD with patent stent RCA  ECHO 03/11/14 EF 15% RV ok  ECHO 08/09/14 Bedside EF 30-35%  Follow up for Heart Failure: Presents to clinic today with complaints of weakness, nausea, no appetite and fever since this morning. She also reports that she has been vomiting since this am. Denies any sick contacts. Has not had the flu vaccine. Continues with milrinone 0.375 mcg through PICC>. + dizziness. Weight at home 184 lbs. Following a low salt diet and drinking less than 2L a a day.    Labs 03/29/14; Cr 1.3 k 4.1 co-ox 76%          04/20/14: K+ 5.3, creatinine 1.15          04/26/14: K 5.1 Cr 1.01          06/01/14:   K 4.0, creatinine 1.28          07/20/14: K 3.5 Cr 1.16          08/25/14 J 4,5 Creatinine 1.4          09/07/14 K 4.3 Creatinine 1.76 Magnesium 2.2  Coreg stopped and lasix was help for 2 days.         11/08/14: K 4.5,  creatinine 1.02, Magnesium 1.9   SH: Lives with partner, Andrea HensenBernadette and 779 year old son. She does not drive. Quit smoking 25 years ago. She does not drink alcohol  FH:   Mom Heart Failure          Father MI          Brother Heart Disease     ROS: All systems negative except as listed in HPI, PMH and Problem List.  Past Medical History  Diagnosis Date  . Chronic systolic heart failure   . Hypertension   . Stroke 02/2010    left brain CVA? recurrent TIA's treated with TPA  . Dyslipidemia   . Premature ventricular contractions (PVCs) (VPCs)   . GERD (gastroesophageal reflux disease)   . Coronary artery disease     a. DES-dRCA 11/2010. b. DES-pRCA 04/2011 (in an area free of disease on prior cath). c. s/p DES-mRCA 03/2013 for NSTEMI.  . Ischemic cardiomyopathy     a. Hx of medtronic ICD. b. EF 15-20% by cath 03/2013.  .Marland Kitchen  Pulmonary hypertension   . Gout   . Obesity   . Thrombocytopenia   . Depression   . Hypotension     a. Admission 09/2012 for hypotn & ARF. b. Meds held 03/2013 due to hypotension.  . Acute renal insufficiency     a. 09/2012. b. Also noted post-cath 03/2013.  . NSTEMI (non-ST elevated myocardial infarction) 03/2013  . Anginal pain   . Asthma   . Microcytic anemia     a. Noted 03/2013 on labs, iron studies WNL.  Marland Kitchen Anxiety   . CHF (congestive heart failure)   . CKD (chronic kidney disease) stage 3, GFR 30-59 ml/min   . HTN (hypertension)   . Primary pulmonary HTN   . Automatic implantable cardiac defibrillator - Medtronic 08/24/2013  . Heart murmur   . Type II diabetes mellitus   . Migraine headache     "no pain; aura/visual problems only; at least 2-3X/month" (01/18/2014)  . Arthritis     "knees" (01/18/2014)  . DJD (degenerative joint disease)   . Bipolar disorder     Current Outpatient Prescriptions  Medication Sig Dispense Refill  . amiodarone (PACERONE) 200 MG tablet Take 1 tablet (200 mg total) by mouth daily. 90 tablet 3  . diclofenac sodium (VOLTAREN) 1  % GEL Apply 2 g topically 4 (four) times daily.    . DULoxetine (CYMBALTA) 60 MG capsule Take 60 mg by mouth daily.    . furosemide (LASIX) 40 MG tablet Take 1 tablet (40 mg total) by mouth 3 (three) times a week. Mon-Wed-Fri 30 tablet 6  . gabapentin (NEURONTIN) 100 MG capsule Take 1 capsule (100 mg total) by mouth 2 (two) times daily. 60 capsule 0  . hydrALAZINE (APRESOLINE) 50 MG tablet Take 1 tablet (50 mg total) by mouth 3 (three) times daily. 90 tablet 6  . magnesium oxide (MAG-OX) 400 (241.3 MG) MG tablet Take 1 tablet (400 mg total) by mouth 3 (three) times daily. 90 tablet 6  . milrinone (PRIMACOR) 20 MG/100ML SOLN infusion Inject 30.225 mcg/min into the vein continuous. 100 mL   . nitroGLYCERIN (NITROSTAT) 0.4 MG SL tablet Place 0.4 mg under the tongue every 5 (five) minutes as needed for chest pain.    Marland Kitchen omeprazole (PRILOSEC) 20 MG capsule Take 20 mg by mouth daily.    . potassium chloride SA (K-DUR,KLOR-CON) 20 MEQ tablet Take 1 tablet (20 mEq total) by mouth as directed. When you take extra lasix 30 tablet 3   No current facility-administered medications for this encounter.    Filed Vitals:   11/08/14 1400  BP: 154/82  Pulse: 125  Temp: 101.1 F (38.4 C)  TempSrc: Oral  Weight: 187 lb 12 oz (85.163 kg)  SpO2: 91%   PHYSICAL EXAM: General:  Weak appearing.Andrea [name] present HEENT: normal Neck: supple. JVP 8. Carotids 2+ bilaterally; no bruits. No lymphadenopathy or thryomegaly appreciated. Cor: PMI normal.  Regular tachy. No rubs, or murmurs. Possible soft s3 Lungs: clear Abdomen: soft, nontender, nondistended. No hepatosplenomegaly. No bruits or masses. Good bowel sounds. Extremities: no cyanosis, clubbing, rash, edema, RUE 2 lumen PICC - no drainage Neuro: alert & orientedx3, cranial nerves grossly intact. Moves all 4 extremities w/o difficulty. Affect pleasant   ASSESSMENT & PLAN:   1. Fevers/weakness - likely viral illness 2. Chronic Systolic HF, ICM s/p ICD;  EF 16% RV ok (03/11/14). 07/2014 ECHO EF ~ 30-35% on milrinone  - NYHA IIIb symptoms and volume status mildly elevated. Will continue lasix 40 mg M/W/F.  Labs from [facility] stable today.    - Not interested in VAD. Would consider transplant but wants to just continue milrinone for now. (We discussed referral to [facility] for Tx eval but she wants to hold off  -Continue hydralazine to 50 bid unable to take tid or to tolerate IMDUR due to severe HAs in setting of known migraines.  - Not on ACE-I with h/o hyperkalemia - Reinforced the need and importance of daily weights, a low sodium diet, and fluid restriction (less than 2 L a day). Instructed to call the HF clinic if weight increases more than 3 lbs overnight or 5 lbs in a week. ] 3. CKD- renal function now normal. BMET per [facility] weekly. Has follow up with Dr Darrick Penna.  4. CAD S/P PCI with DES to prox RCA and stent to distal RCA in 2012 . No evidence of ischemia.  5. History of liver failure and cirrhosis. Treated at [facility]---> thought to be from cardiomyopathy 6. SVT - continue amio 200 daily.  Heart failure is relatively stable on milrinone however she is quite tachycardic. Main issue currently seems to be viral illness. She says she feels too weak to go home. Will send to ER and check labs. Will ask [facility] to place in observation and we will follow for HF.   [name] MD  2:58 PM

## 2014-11-08 NOTE — Progress Notes (Signed)
[facility]  Patient Status:  Active pt with [facility] prior to this ED visit/admission  [facility] is providing the following services: HHRN and Home Infusion Pharmacy team for home Milrinone.  [facility] hospital team will follow and be available to support DC to home when deemed appropriate by HF Team.  If patient discharges after hours, please call [phone].   Sedalia Muta 11/08/2014, 5:36 PM

## 2014-11-08 NOTE — ED Notes (Signed)
Dr. Newton at bedside. 

## 2014-11-08 NOTE — ED Notes (Signed)
Pt reports she woke up this morning with nv and constipation. Last bm this morning. Pt sts she has been unable to keep anything down. Pt also sts she is dizzy and fatigued.

## 2014-11-08 NOTE — ED Notes (Signed)
Per pharmacy ok to run normal saline through second port on PICC line.

## 2014-11-08 NOTE — Progress Notes (Signed)
ANTIBIOTIC CONSULT NOTE - INITIAL  Pharmacy Consult for Vancomycin and Zosyn Indication: rule out sepsis  Allergies  Allergen Reactions  . Asa [Aspirin] Shortness Of Breath  . Aspirin Shortness Of Breath and Other (See Comments)    Asthma symptoms  . Brilinta [Ticagrelor] Shortness Of Breath and Other (See Comments)    Gout   . Percocet [Oxycodone-Acetaminophen] Nausea And Vomiting  . Darvon Nausea And Vomiting  . Imitrex [Sumatriptan Base] Other (See Comments)    Unknown reaction  . Licorice Flavor [Artificial Licorice Flavor]     Black licorice induces asthma   . Nsaids Nausea And Vomiting  . Nsaids Other (See Comments)    GI upset     Patient Measurements: Actual Body Weight: 85.2 kg Ideal Body Weight: 45.5 kg Adjusted Body Weight: 61.4 kg  Vital Signs: Temp: 99 F (37.2 C) (11/09 1855) Temp Source: Oral (11/09 1855) BP: 101/53 mmHg (11/09 2000) Pulse Rate: 105 (11/09 2000) Intake/Output from previous day:   Intake/Output from this shift:    Labs:  Recent Labs  11/08/14 1800  WBC 7.9  HGB 10.4*  PLT 173  CREATININE 1.20*   CrCl cannot be calculated (Unknown ideal weight.). No results for input(s): VANCOTROUGH, VANCOPEAK, VANCORANDOM, GENTTROUGH, GENTPEAK, GENTRANDOM, TOBRATROUGH, TOBRAPEAK, TOBRARND, AMIKACINPEAK, AMIKACINTROU, AMIKACIN in the last 72 hours.   Microbiology: No results found for this or any previous visit (from the past 720 hour(s)).  Medical History: Past Medical History  Diagnosis Date  . Chronic systolic heart failure   . Hypertension   . Stroke 02/2010    left brain CVA? recurrent TIA's treated with TPA  . Dyslipidemia   . Premature ventricular contractions (PVCs) (VPCs)   . GERD (gastroesophageal reflux disease)   . Coronary artery disease     a. DES-dRCA 11/2010. b. DES-pRCA 04/2011 (in an area free of disease on prior cath). c. s/p DES-mRCA 03/2013 for NSTEMI.  . Ischemic cardiomyopathy     a. Hx of medtronic ICD. b. EF  15-20% by cath 03/2013.  . Pulmonary hypertension   . Gout   . Obesity   . Thrombocytopenia   . Depression   . Hypotension     a. Admission 09/2012 for hypotn & ARF. b. Meds held 03/2013 due to hypotension.  . Acute renal insufficiency     a. 09/2012. b. Also noted post-cath 03/2013.  . NSTEMI (non-ST elevated myocardial infarction) 03/2013  . Anginal pain   . Asthma   . Microcytic anemia     a. Noted 03/2013 on labs, iron studies WNL.  Marland Kitchen Anxiety   . CHF (congestive heart failure)   . CKD (chronic kidney disease) stage 3, GFR 30-59 ml/min   . HTN (hypertension)   . Primary pulmonary HTN   . Automatic implantable cardiac defibrillator - Medtronic 08/24/2013  . Heart murmur   . Type II diabetes mellitus   . Migraine headache     "no pain; aura/visual problems only; at least 2-3X/month" (01/18/2014)  . Arthritis     "knees" (01/18/2014)  . DJD (degenerative joint disease)   . Bipolar disorder     Medications:   (Not in a hospital admission) Scheduled:  . amiodarone  200 mg Oral Daily  . DULoxetine  60 mg Oral Daily  . gabapentin  100 mg Oral BID  . heparin  5,000 Units Subcutaneous 3 times per day  . insulin aspart  0-9 Units Subcutaneous 6 times per day  . [COMPLETED] iohexol  25 mL Oral Q1 Hr  x 2  . magnesium oxide  400 mg Oral TID  . pantoprazole  40 mg Oral Daily   Infusions:  . sodium chloride 75 mL/hr at 11/08/14 1916  . sodium chloride    . milrinone     Assessment: 63yo female presents to ED with N/V and generalized fatigue for past 2 days. Pharmacy is consulted to dose vancomycin and zosyn for sepsis rule out. Pt is febrile to 101.1, WBC wnl, sCr 1.2 with CrCl ~ 55 mL/min.  Goal of Therapy:  Vancomycin trough level 15-20 mcg/ml  Plan:  Vancomycin 1000mg  IV x 1 then 500mg  IV q12h Zosyn 3.375g IV q8h Measure antibiotic drug levels at steady state Follow up culture results and renal function  Arlean Hoppingorey M. Newman PiesBall, PharmD Clinical Pharmacist Pager  [phone] 11/08/2014,8:13 PM

## 2014-11-08 NOTE — ED Notes (Signed)
Sent from Dr. Kathlen Brunswick office. States he would like patient to be admitted with [facility] and he will follow for CHF.

## 2014-11-08 NOTE — ED Notes (Signed)
IV called about patient bloodwork. They are on their way to draw off of PICC line.

## 2014-11-08 NOTE — ED Notes (Signed)
IV team at bedside drawing blood.

## 2014-11-08 NOTE — ED Notes (Signed)
PA at bedside.

## 2014-11-08 NOTE — ED Provider Notes (Signed)
CSN: [id number]     Arrival date & time 11/08/14  1511 History   First MD Initiated Contact with Patient 11/08/14 1718     Chief Complaint  Patient presents with  . Emesis     (Consider location/radiation/quality/duration/timing/severity/associated sxs/prior Treatment) Patient is a 63 y.o. female presenting with vomiting. The history is provided by the patient and medical records.  Emesis  This is a 63 y.o. F with PMH significant for HTN, CVA, dyslipidemia, GERD, CAD, CHF on milrinone drip with estimated 30-35% EF on 2D echo 08/09/14, presenting to the ED for nausea, vomiting, and generalized fatigue for the past 2 days.  Estimates 3 episodes of non-bloody, non-bilious emesis so far today.  Patient states she has very low energy, decreased appetite, and overall "doesn't feel well".  Denies known sick contacts.  Notes fever and chills.  Notes chronically irregular bowel movements, last BM earlier this morning.  No melena, hematochezia, or hematemesis.  Patient denies chest pain or SOB.  Does note some weight gain recently, estimates approx 5 lbs.  No issues with her milrinone drip.  She is currently following her salt restricted diet and fluid restriction guidelines.  States she has only been able to tolerate PO ice chips today.  Denies current abdominal pain, notes discomfort from vomiting.  Patient febrile, mildly tachycardic on arrival.  Past Medical History  Diagnosis Date  . Chronic systolic heart failure   . Hypertension   . Stroke 02/2010    left brain CVA? recurrent TIA's treated with TPA  . Dyslipidemia   . Premature ventricular contractions (PVCs) (VPCs)   . GERD (gastroesophageal reflux disease)   . Coronary artery disease     a. DES-dRCA 11/2010. b. DES-pRCA 04/2011 (in an area free of disease on prior cath). c. s/p DES-mRCA 03/2013 for NSTEMI.  . Ischemic cardiomyopathy     a. Hx of medtronic ICD. b. EF 15-20% by cath 03/2013.  . Pulmonary hypertension   . Gout   . Obesity   .  Thrombocytopenia   . Depression   . Hypotension     a. Admission 09/2012 for hypotn & ARF. b. Meds held 03/2013 due to hypotension.  . Acute renal insufficiency     a. 09/2012. b. Also noted post-cath 03/2013.  . NSTEMI (non-ST elevated myocardial infarction) 03/2013  . Anginal pain   . Asthma   . Microcytic anemia     a. Noted 03/2013 on labs, iron studies WNL.  Marland Kitchen Anxiety   . CHF (congestive heart failure)   . CKD (chronic kidney disease) stage 3, GFR 30-59 ml/min   . HTN (hypertension)   . Primary pulmonary HTN   . Automatic implantable cardiac defibrillator - Medtronic 08/24/2013  . Heart murmur   . Type II diabetes mellitus   . Migraine headache     "no pain; aura/visual problems only; at least 2-3X/month" (01/18/2014)  . Arthritis     "knees" (01/18/2014)  . DJD (degenerative joint disease)   . Bipolar disorder    Past Surgical History  Procedure Laterality Date  . Mass excision Left     hip  . Cervical polypectomy  1970's  . US echocardiography  2011  . Tonsillectomy  1960's?  . Right oophorectomy  1980's  . Dilation and curettage of uterus  1970's  . Ostectomy metatarsal Left 1993    "5th toe; from rickets" (05/07/2013)  . Implantable cardioverter defibrillator implant  ~ 2008  . Coronary angioplasty with stent placement  2000's    "  2 stents" (05/07/2013)  . Coronary angioplasty with stent placement  03/2013    PCI to RCA/notes 05/05/2013; "makes total of 3" (05/07/2013)   Family History  Problem Relation Age of Onset  . Heart failure Mother   . Heart attack Father   . Heart disease Brother    History  Substance Use Topics  . Smoking status: Former Smoker -- 0.05 packs/day for 5 years    Types: Cigarettes    Quit date: 04/23/1982  . Smokeless tobacco: Never Used  . Alcohol Use: No   OB History    No data available     Review of Systems  Constitutional: Positive for fever.  Gastrointestinal: Positive for nausea and vomiting.  All other systems reviewed and are  negative.     Allergies  Asa; Aspirin; Brilinta; Percocet; Darvon; Imitrex; Licorice flavor; Nsaids; and Nsaids  Home Medications   Prior to Admission medications   Medication Sig Start Date End Date Taking? Authorizing Provider  amiodarone (PACERONE) 200 MG tablet Take 1 tablet (200 mg total) by mouth daily. 06/15/14   Dolores Pattyaniel R Bensimhon, MD  diclofenac sodium (VOLTAREN) 1 % GEL Apply 2 g topically 4 (four) times daily.    Historical Provider, MD  DULoxetine (CYMBALTA) 60 MG capsule Take 60 mg by mouth daily.    Historical Provider, MD  furosemide (LASIX) 40 MG tablet Take 1 tablet (40 mg total) by mouth 3 (three) times a week. Mon-Wed-Fri 09/13/14   Amy D Clegg, NP  gabapentin (NEURONTIN) 100 MG capsule Take 1 capsule (100 mg total) by mouth 2 (two) times daily. 07/29/14   Dolores Pattyaniel R Bensimhon, MD  hydrALAZINE (APRESOLINE) 50 MG tablet Take 1 tablet (50 mg total) by mouth 3 (three) times daily. 10/11/14   Amy D Clegg, NP  magnesium oxide (MAG-OX) 400 (241.3 MG) MG tablet Take 1 tablet (400 mg total) by mouth 3 (three) times daily. 08/09/14   Dolores Pattyaniel R Bensimhon, MD  milrinone [facility](PRIMACOR) 20 MG/100ML SOLN infusion Inject 30.225 mcg/min into the vein continuous. 04/12/14   Aundria RudAli B Cosgrove, NP  nitroGLYCERIN (NITROSTAT) 0.4 MG SL tablet Place 0.4 mg under the tongue every 5 (five) minutes as needed for chest pain.    Historical Provider, MD  omeprazole (PRILOSEC) 20 MG capsule Take 20 mg by mouth daily.    Historical Provider, MD  potassium chloride SA (K-DUR,KLOR-CON) 20 MEQ tablet Take 1 tablet (20 mEq total) by mouth as directed. When you take extra lasix 08/13/14   Aundria RudAli B Cosgrove, NP   BP 114/50 mmHg  Pulse 111  Temp(Src) 100.6 F (38.1 C) (Oral)  Resp 21  SpO2 96%   Physical Exam  Constitutional: She is oriented to person, place, and time. She appears well-developed and well-nourished. No distress.  HENT:  Head: Normocephalic and atraumatic.  Mouth/Throat: Uvula is midline and oropharynx  is clear and moist. Mucous membranes are dry.  Mildly dry mucous membranes  Eyes: Conjunctivae and EOM are normal. Pupils are equal, round, and reactive to light.  Neck: Normal range of motion. Neck supple.  Cardiovascular: Normal rate, regular rhythm and normal heart sounds.   Pulmonary/Chest: Effort normal and breath sounds normal. No respiratory distress. She has no wheezes.  Abdominal: Soft. Bowel sounds are normal. There is no tenderness. There is no guarding.  Abdomen soft, non-distended, no focal tenderness or peritoneal signs  Musculoskeletal: Normal range of motion. She exhibits no edema.  PICC line RUE; site appears clean without signs of infection  Neurological: She is  alert and oriented to person, place, and time.  Skin: Skin is warm and dry. She is not diaphoretic.  Psychiatric: She has a normal mood and affect.  Nursing note and vitals reviewed.   ED Course  Procedures (including critical care time) Labs Review Labs Reviewed  CBC WITH DIFFERENTIAL - Abnormal; Notable for the following:    Hemoglobin 10.4 (*)    HCT 31.8 (*)    MCV 66.5 (*)    MCH 21.8 (*)    RDW 17.1 (*)    All other components within normal limits  COMPREHENSIVE METABOLIC PANEL - Abnormal; Notable for the following:    Glucose, Bld 135 (*)    Creatinine, Ser 1.20 (*)    Albumin 3.1 (*)    Alkaline Phosphatase 127 (*)    GFR calc non Af Amer 47 (*)    GFR calc Af Amer 55 (*)    All other components within normal limits  PRO B NATRIURETIC PEPTIDE - Abnormal; Notable for the following:    Pro B Natriuretic peptide (BNP) 2681.0 (*)    All other components within normal limits  LIPASE, BLOOD  URINALYSIS, ROUTINE W REFLEX MICROSCOPIC  I-STAT TROPOININ, ED  I-STAT CG4 LACTIC ACID, ED    Imaging Review Dg Abd Acute W/chest  11/08/2014   CLINICAL DATA:  Nausea and vomiting for 1 day. Cough. Fever. Periumbilical pain.  EXAM: ACUTE ABDOMEN SERIES (ABDOMEN 2 VIEW & CHEST 1 VIEW)  COMPARISON:  CT  06/07/2014.  FINDINGS: Blunting of both costophrenic angles is present compatible with small pleural effusions. Lower cervical ACDF. RIGHT upper extremity PICC with the tip in the mid SVC. LEFT subclavian AICD. The cardiopericardial silhouette is mildly enlarged.  There are dilated loops of small bowel within the LEFT central abdomen. Maximal diameter small bowel loops is 3.7 cm. There does appear to be colonic gas.  IMPRESSION: 1. Small bilateral pleural effusions. 2. Mildly dilated loops of small bowel within the LEFT abdomen, suspicious for partial small bowel obstruction.   Electronically Signed   By: Andreas Newport M.D.   On: 11/08/2014 18:57     EKG Interpretation None      MDM   Final diagnoses:  Nausea and vomiting  Partial small bowel obstruction  Chronic systolic congestive heart failure  S/P PICC central line placement   63 year old female with generalized fatigue, nausea, and vomiting. On arrival, patient febrile and mildly tachycardic. Her mucous membranes do appear somewhat dry, only tolerated PO ice chips today. Abdominal exam largely benign, she reports some discomfort with vomiting.  She was given Zofran in the waiting room with improvement of her nausea. PICC line in right upper extremity appears clean without signs of infection. Will plan for lab work and acute abdominal series.  Patient given judicious fluids at 75cc/hour given her baseline fluid restrictions of 2L/day.  Lab work as above, normal WBC count and lactic acid.  Renal function appears at baseline when compared with previous.  BNP slightly above baseline.  Acute abdominal series with findings suspicion for partial SBO.  Nausea remains well controlled, no pain reported at this time.  Case discussed with hospitalist, Dr. Alvester Morin, who will admit.  Dr. Teressa Lower to follow along for management of CHF.  8:48 PM Attempted to page cardiology x2 regarding patient's BP and possible adjustment of drips without return of page.   Dr. Alvester Morin made aware.  Will continue attempting to discuss with cards.  Garlon Hatchet, PA-C 11/08/14 2239  Enid Skeens, MD  11/09/14 0050 

## 2014-11-08 NOTE — ED Notes (Signed)
Pt refused for phlebotomy to collect blood because she has a PICC line

## 2014-11-08 NOTE — H&P (Addendum)
Hospitalist Admission History and Physical  Patient name: Andrea [name] Medical record number: [id number] Date of birth: 1951/03/18 Age: 63 y.o. Gender: female  Primary Care Provider: Lolita Patella, MD  Chief Complaint: SBO, SIRS vs. Sepsis, weakness  History of Present Illness:This is a 63 y.o. year old female with significant past medical history of chronic systolic heart failure s/p ICD on milrinone and amiodarone, Asthma, CAD s/p stenting, type 2 DM, hx/o CVA, pulm HTN presenting with SBO, SIRS vs. Sepsis, weakness. Pt states that she has had generalized malaise over the past 1-2 days. Felt generally weak yesterday with subjective fevers and chills. Symptoms progressively worsened over the course of the day.Had one epsiode of ? Bilious vomiting today. Denies any diarrhea. Mild intermittent SOB. No CP. Went to appt at heart failure clinic w Dr. Gala Romney. Stable from a cardiac standpoint. However, pt w/ profound weakness to the point that pt felt unable to go ho home. Pt was transferred to ER for further evaluation.  On presentation,  tmax 101.1, HR 10s-120s, resp 10s-20s, BP 80s-150s-mainly in 100s, Satting in mid 90s on RA. CBC and CMET WNL apart from hgb 10.4, Cr 1.2 (baseline 1-1-1.4). Lactate WNL @ 0.64. ProBNP 2680 (near baseline). Trop 0.07. UA pending. EKG shows sinus tach, mild LVH, HR 113, nonspecific ST and T wave changes.  Acute abdominal series with chest x-ray shows small bilateral pleural effusions with mildly dilated small bowel loose within the left abdomen concerning for partial small bowel obstruction.  Pt also reports recently being treated for LUE abscess/hidradenitis outpt. Was put on abx, but pt is not sure. This is resolved per pt.  Assessment and Plan: VEVERLY LARIMER is a 63 y.o. year old female presenting with SBO, SIRS vs. Sepsis  Active Problems:   SBO (small bowel obstruction)   SIRS (systemic inflammatory response syndrome)   1- SBO  -f/u CT  abd and pelvis w/o contrast pending -may need surgical consult pending findings given comorbidities  -NPO  -No active emesis-defer NG tube  -f/u CT scan   2- SIRS vs. Sepsis  -likely secondary to above  -meets criteria based on temp, HR, BP  -no identifiable infectious source as of yet  -UA pending -CXR w/o infiltrate   -noted resolved LUE abscess per pt-no active drainage -f/u CT  -no leukocytosis  -panculture  -vanc and zosyn x 1 given comorbidities-reassess in am  -f/u CT  -gently hydrate in setting of above w/ LLN BPs and decreased po intake  -cards c/s pending   3- chronic systolic heart failure, CAD  -s/p ICD  -on milrinone gtt and amiodarone -stable from a cardiac standpoint per heart failure clinic note today -noted LLN BPs in setting of above  -I have asked ED PA Allyne Gee) to consult cards formally as current cardiac regimen may need to be titrated down given above  -hold hydralalazine -tele bed in stepdown  -cont to follow -f/u cards recs   4- type 2 DM  -SSI  -A1C  -sugars stable currently   5-hx/o CVA -no focal weakness/hemideficits currently  -follow   6- CKD  -at baseline on presentation  -follow   7- Hidradenitis -recently treated for abscess vs. Hidradenitis outpt  -clinically resolved per pt.  -follow   FEN/GI: NPO  Prophylaxis: sub q heparin  Disposition: pending further evaluation  Code Status:Full Code    Patient Active Problem List   Diagnosis Date Noted  . SBO (small bowel obstruction) 11/08/2014  . SIRS (systemic inflammatory response  syndrome) 11/08/2014  . Chronic systolic heart failure 09/13/2014  . Heart palpitations 09/13/2014  . CKD (chronic kidney disease) 03/29/2014  . Renal failure, acute on chronic 03/09/2014  . Dyspnea 03/08/2014  . Heart failure 03/08/2014  . Chronic systolic CHF (congestive heart failure) 03/08/2014  . Nausea and vomiting 03/08/2014  . AICD (automatic cardioverter/defibrillator) present 03/08/2014   . Sinus tachycardia 03/08/2014  . Fulminant hepatic failure 01/21/2014  . Oliguria 01/20/2014  . Hypocalcemia 01/18/2014  . Hypoglycemia 01/18/2014  . Failure to thrive 01/18/2014  . Automatic implantable cardiac defibrillator - Medtronic 08/24/2013  . Biliary pleural effusion 08/24/2013  . CAP (community acquired pneumonia) 08/20/2013  . Positive blood culture 08/20/2013  . Cardiomyopathy 08/20/2013  . DM2 (diabetes mellitus, type 2) 08/20/2013  . Acute respiratory failure with hypoxia 08/19/2013  . Acute systolic CHF (congestive heart failure) 08/19/2013  . Encephalopathy acute 08/19/2013  . Orthostatic hypotension 05/08/2013  . Acute renal insufficiency 05/08/2013  . Acute on chronic systolic CHF (congestive heart failure) 05/07/2013  . Right carotid bruit 05/04/2013  . Acute renal failure 04/28/2013  . NSTEMI (non-ST elevated myocardial infarction) 04/27/2013  . ICD-Medtronic 11/18/2012  . Dehydration 10/01/2012  . Azotemia 10/01/2012  . Heartburn 10/01/2012  . Dizziness 09/30/2012  . Hypotension 09/30/2012  . ARF (acute renal failure) 09/30/2012  . Hypertension   . Diabetes mellitus type 2, controlled   . Dyslipidemia   . Premature ventricular contractions (PVCs) (VPCs)   . Coronary artery disease   . Dilated cardiomyopathy   . ISCHEMIC HEART DISEASE 02/14/2011  . Chronic systolic congestive heart failure, NYHA class 4-EF 15% per ECHO 2011 02/14/2011  . AUTOMATIC IMPLANTABLE CARDIAC DEFIBRILLATOR SITU 02/14/2011  . History of CVA (cerebrovascular accident) 02/28/2010   Past Medical History: Past Medical History  Diagnosis Date  . Chronic systolic heart failure   . Hypertension   . Stroke 02/2010    left brain CVA? recurrent TIA's treated with TPA  . Dyslipidemia   . Premature ventricular contractions (PVCs) (VPCs)   . GERD (gastroesophageal reflux disease)   . Coronary artery disease     a. DES-dRCA 11/2010. b. DES-pRCA 04/2011 (in an area free of disease on  prior cath). c. s/p DES-mRCA 03/2013 for NSTEMI.  . Ischemic cardiomyopathy     a. Hx of medtronic ICD. b. EF 15-20% by cath 03/2013.  . Pulmonary hypertension   . Gout   . Obesity   . Thrombocytopenia   . Depression   . Hypotension     a. Admission 09/2012 for hypotn & ARF. b. Meds held 03/2013 due to hypotension.  . Acute renal insufficiency     a. 09/2012. b. Also noted post-cath 03/2013.  . NSTEMI (non-ST elevated myocardial infarction) 03/2013  . Anginal pain   . Asthma   . Microcytic anemia     a. Noted 03/2013 on labs, iron studies WNL.  Marland Kitchen Anxiety   . CHF (congestive heart failure)   . CKD (chronic kidney disease) stage 3, GFR 30-59 ml/min   . HTN (hypertension)   . Primary pulmonary HTN   . Automatic implantable cardiac defibrillator - Medtronic 08/24/2013  . Heart murmur   . Type II diabetes mellitus   . Migraine headache     "no pain; aura/visual problems only; at least 2-3X/month" (01/18/2014)  . Arthritis     "knees" (01/18/2014)  . DJD (degenerative joint disease)   . Bipolar disorder     Past Surgical History: Past Surgical History  Procedure Laterality Date  .  Mass excision Left     hip  . Cervical polypectomy  1970's  . US echocardiography  2011  . Tonsillectomy  1960's?  . Right oophorectomy  1980's  . Dilation and curettage of uterus  1970's  . Ostectomy metatarsal Left 1993    "5th toe; from rickets" (05/07/2013)  . Implantable cardioverter defibrillator implant  ~ 2008  . Coronary angioplasty with stent placement  2000's    "2 stents" (05/07/2013)  . Coronary angioplasty with stent placement  03/2013    PCI to RCA/notes 05/05/2013; "makes total of 3" (05/07/2013)    Social History: History   Social History  . Marital Status: Single    Spouse Name: N/A    Number of Children: 1  . Years of Education: N/A   Occupational History  .     Social History Main Topics  . Smoking status: Former Smoker -- 0.05 packs/day for 5 years    Types: Cigarettes    Quit  date: 04/23/1982  . Smokeless tobacco: Never Used  . Alcohol Use: No  . Drug Use: No  . Sexual Activity: Yes   Other Topics Concern  . None   Social History Narrative   ** Merged History Encounter **        Family History: Family History  Problem Relation Age of Onset  . Heart failure Mother   . Heart attack Father   . Heart disease Brother     Allergies: Allergies  Allergen Reactions  . Asa [Aspirin] Shortness Of Breath  . Aspirin Shortness Of Breath and Other (See Comments)    Asthma symptoms  . Brilinta [Ticagrelor] Shortness Of Breath and Other (See Comments)    Gout   . Percocet [Oxycodone-Acetaminophen] Nausea And Vomiting  . Darvon Nausea And Vomiting  . Imitrex [Sumatriptan Base] Other (See Comments)    Unknown reaction  . Licorice Flavor [Artificial Licorice Flavor]     Black licorice induces asthma   . Nsaids Nausea And Vomiting  . Nsaids Other (See Comments)    GI upset     Current Facility-Administered Medications  Medication Dose Route Frequency Provider Last Rate Last Dose  . 0.9 %  sodium chloride infusion   Intravenous Continuous Garlon Hatchet, PA-C 75 mL/hr at 11/08/14 1916    . 0.9 %  sodium chloride infusion   Intravenous Continuous Doree Albee, MD      . amiodarone (PACERONE) tablet 200 mg  200 mg Oral Daily Doree Albee, MD      . DULoxetine (CYMBALTA) DR capsule 60 mg  60 mg Oral Daily Doree Albee, MD      . gabapentin (NEURONTIN) capsule 100 mg  100 mg Oral BID Doree Albee, MD      . heparin injection 5,000 Units  5,000 Units Subcutaneous 3 times per day Doree Albee, MD      . insulin aspart (novoLOG) injection 0-9 Units  0-9 Units Subcutaneous 6 times per day Doree Albee, MD      . magnesium oxide (MAG-OX) tablet 400 mg  400 mg Oral TID Doree Albee, MD      . milrinone [facility]) 20 MG/100ML (0.2 mg/mL) infusion  0.375 mcg/kg/min Intravenous Continuous Doree Albee, MD      . nitroGLYCERIN (NITROSTAT) SL tablet 0.4 mg  0.4  mg Sublingual Q5 min PRN Doree Albee, MD      . pantoprazole (PROTONIX) EC tablet 40 mg  40 mg Oral Daily Doree Albee, MD       Current  Outpatient Prescriptions  Medication Sig Dispense Refill  . amiodarone (PACERONE) 200 MG tablet Take 1 tablet (200 mg total) by mouth daily. 90 tablet 3  . diclofenac sodium (VOLTAREN) 1 % GEL Apply 2 g topically 4 (four) times daily as needed (pain).     . DULoxetine (CYMBALTA) 60 MG capsule Take 60 mg by mouth daily.    . furosemide (LASIX) 40 MG tablet Take 1 tablet (40 mg total) by mouth 3 (three) times a week. Mon-Wed-Fri (Patient taking differently: Take 40 mg by mouth every Monday, Wednesday, and Friday. ) 30 tablet 6  . gabapentin (NEURONTIN) 100 MG capsule Take 1 capsule (100 mg total) by mouth 2 (two) times daily. (Patient taking differently: Take 200 mg by mouth at bedtime. ) 60 capsule 0  . hydrALAZINE (APRESOLINE) 50 MG tablet Take 1 tablet (50 mg total) by mouth 3 (three) times daily. 90 tablet 6  . magnesium oxide (MAG-OX) 400 (241.3 MG) MG tablet Take 1 tablet (400 mg total) by mouth 3 (three) times daily. 90 tablet 6  . milrinone (PRIMACOR) 20 MG/100ML SOLN infusion Inject 30.225 mcg/min into the vein continuous. 100 mL   . nitroGLYCERIN (NITROSTAT) 0.4 MG SL tablet Place 0.4 mg under the tongue every 5 (five) minutes as needed for chest pain.    Marland Kitchen. omeprazole (PRILOSEC) 20 MG capsule Take 20 mg by mouth daily.    . potassium chloride SA (K-DUR,KLOR-CON) 20 MEQ tablet Take 1 tablet (20 mEq total) by mouth as directed. When you take extra lasix 30 tablet 3   Review Of Systems: 12 point ROS negative except as noted above in HPI.  Physical Exam: Filed Vitals:   11/08/14 1930  BP: 105/52  Pulse: 106  Temp:   Resp: 19    General: cooperative and mildly ill appearing  HEENT: PERRLA, extra ocular movement intact and dry oral mucosa Heart: S1, S2 normal, no murmur, rub or gallop, regular rate and rhythm Lungs: clear to auscultation, no  wheezes or rales and unlabored breathing Abdomen:+ bowel sounds, mild generalized abd TTP  Extremities: extremities normal, atraumatic, no cyanosis or edema Skin:resolved/healing hydradenitis, skin abscess in LUE  Neurology: normal without focal findings  Labs and Imaging: Lab Results  Component Value Date/Time   NA 140 11/08/2014 06:00 PM   K 4.2 11/08/2014 06:00 PM   CL 102 11/08/2014 06:00 PM   CO2 24 11/08/2014 06:00 PM   BUN 20 11/08/2014 06:00 PM   CREATININE 1.20* 11/08/2014 06:00 PM   GLUCOSE 135* 11/08/2014 06:00 PM   Lab Results  Component Value Date   WBC 7.9 11/08/2014   HGB 10.4* 11/08/2014   HCT 31.8* 11/08/2014   MCV 66.5* 11/08/2014   PLT 173 11/08/2014    Dg Abd Acute W/chest  11/08/2014   CLINICAL DATA:  Nausea and vomiting for 1 day. Cough. Fever. Periumbilical pain.  EXAM: ACUTE ABDOMEN SERIES (ABDOMEN 2 VIEW & CHEST 1 VIEW)  COMPARISON:  CT 06/07/2014.  FINDINGS: Blunting of both costophrenic angles is present compatible with small pleural effusions. Lower cervical ACDF. RIGHT upper extremity PICC with the tip in the mid SVC. LEFT subclavian AICD. The cardiopericardial silhouette is mildly enlarged.  There are dilated loops of small bowel within the LEFT central abdomen. Maximal diameter small bowel loops is 3.7 cm. There does appear to be colonic gas.  IMPRESSION: 1. Small bilateral pleural effusions. 2. Mildly dilated loops of small bowel within the LEFT abdomen, suspicious for partial small bowel obstruction.   Electronically  Signed   By: Andreas Newport M.D.   On: 11/08/2014 18:57           Doree Albee MD  Pager: [phone]

## 2014-11-09 DIAGNOSIS — I5022 Chronic systolic (congestive) heart failure: Secondary | ICD-10-CM

## 2014-11-09 DIAGNOSIS — B349 Viral infection, unspecified: Secondary | ICD-10-CM

## 2014-11-09 LAB — CBC WITH DIFFERENTIAL/PLATELET
BASOS ABS: 0.1 10*3/uL (ref 0.0–0.1)
BASOS PCT: 1 % (ref 0–1)
Eosinophils Absolute: 0.2 10*3/uL (ref 0.0–0.7)
Eosinophils Relative: 3 % (ref 0–5)
HEMATOCRIT: 29.4 % — AB (ref 36.0–46.0)
HEMOGLOBIN: 9.4 g/dL — AB (ref 12.0–15.0)
LYMPHS ABS: 1.5 10*3/uL (ref 0.7–4.0)
LYMPHS PCT: 28 % (ref 12–46)
MCH: 21.4 pg — ABNORMAL LOW (ref 26.0–34.0)
MCHC: 32 g/dL (ref 30.0–36.0)
MCV: 66.8 fL — AB (ref 78.0–100.0)
MONOS PCT: 13 % — AB (ref 3–12)
Monocytes Absolute: 0.7 10*3/uL (ref 0.1–1.0)
Neutro Abs: 2.9 10*3/uL (ref 1.7–7.7)
Neutrophils Relative %: 55 % (ref 43–77)
Platelets: 145 10*3/uL — ABNORMAL LOW (ref 150–400)
RBC: 4.4 MIL/uL (ref 3.87–5.11)
RDW: 17.2 % — AB (ref 11.5–15.5)
Smear Review: DECREASED
WBC: 5.4 10*3/uL (ref 4.0–10.5)

## 2014-11-09 LAB — HEMOGLOBIN A1C
HEMOGLOBIN A1C: 6.8 % — AB (ref <5.7)
MEAN PLASMA GLUCOSE: 148 mg/dL — AB (ref <117)

## 2014-11-09 LAB — GLUCOSE, CAPILLARY
GLUCOSE-CAPILLARY: 106 mg/dL — AB (ref 70–99)
GLUCOSE-CAPILLARY: 71 mg/dL (ref 70–99)
GLUCOSE-CAPILLARY: 95 mg/dL (ref 70–99)
Glucose-Capillary: 98 mg/dL (ref 70–99)
Glucose-Capillary: 97 mg/dL (ref 70–99)
Glucose-Capillary: 83 mg/dL (ref 70–99)
Glucose-Capillary: 61 mg/dL — ABNORMAL LOW (ref 70–99)
Glucose-Capillary: 100 mg/dL — ABNORMAL HIGH (ref 70–99)

## 2014-11-09 LAB — COMPREHENSIVE METABOLIC PANEL
ALBUMIN: 2.8 g/dL — AB (ref 3.5–5.2)
ALK PHOS: 111 U/L (ref 39–117)
ALT: 25 U/L (ref 0–35)
ANION GAP: 13 (ref 5–15)
AST: 28 U/L (ref 0–37)
BUN: 22 mg/dL (ref 6–23)
CHLORIDE: 103 meq/L (ref 96–112)
CO2: 24 mEq/L (ref 19–32)
CREATININE: 1.33 mg/dL — AB (ref 0.50–1.10)
Calcium: 8.5 mg/dL (ref 8.4–10.5)
GFR calc Af Amer: 48 mL/min — ABNORMAL LOW (ref >90)
GFR calc non Af Amer: 42 mL/min — ABNORMAL LOW (ref >90)
Glucose, Bld: 104 mg/dL — ABNORMAL HIGH (ref 70–99)
POTASSIUM: 3.9 meq/L (ref 3.7–5.3)
Sodium: 140 mEq/L (ref 137–147)
TOTAL PROTEIN: 7.1 g/dL (ref 6.0–8.3)
Total Bilirubin: 0.6 mg/dL (ref 0.3–1.2)

## 2014-11-09 LAB — IRON AND TIBC
Iron: 33 ug/dL — ABNORMAL LOW (ref 42–135)
Saturation Ratios: 11 % — ABNORMAL LOW (ref 20–55)
TIBC: 293 ug/dL (ref 250–470)
UIBC: 260 ug/dL (ref 125–400)

## 2014-11-09 LAB — CARBOXYHEMOGLOBIN
Carboxyhemoglobin: 1.2 % (ref 0.5–1.5)
Methemoglobin: 1.4 % (ref 0.0–1.5)
O2 Saturation: 70.3 %
TOTAL HEMOGLOBIN: 9.6 g/dL — AB (ref 12.0–16.0)

## 2014-11-09 LAB — RETICULOCYTES
RBC.: 4.49 MIL/uL (ref 3.87–5.11)
RETIC CT PCT: 1.2 % (ref 0.4–3.1)
Retic Count, Absolute: 53.9 10*3/uL (ref 19.0–186.0)

## 2014-11-09 LAB — TROPONIN I

## 2014-11-09 LAB — FOLATE: Folate: 13.6 ng/mL

## 2014-11-09 LAB — VITAMIN B12: VITAMIN B 12: 545 pg/mL (ref 211–911)

## 2014-11-09 LAB — FERRITIN: Ferritin: 38 ng/mL (ref 10–291)

## 2014-11-09 MED ORDER — ACETAMINOPHEN 325 MG PO TABS
650.0000 mg | ORAL_TABLET | ORAL | Status: DC | PRN
  Administered 2014-11-09: 650 mg via ORAL
  Filled 2014-11-09: qty 2

## 2014-11-09 MED ORDER — SODIUM CHLORIDE 0.9 % IJ SOLN
INTRAMUSCULAR | Status: AC
  Administered 2014-11-09: 10 mL
  Filled 2014-11-09: qty 20

## 2014-11-09 MED ORDER — ZOLPIDEM TARTRATE 5 MG PO TABS
5.0000 mg | ORAL_TABLET | Freq: Once | ORAL | Status: AC
  Filled 2014-11-09: qty 1

## 2014-11-09 MED ORDER — SODIUM CHLORIDE 0.9 % IJ SOLN
INTRAMUSCULAR | Status: AC
  Administered 2014-11-09: 10 mL
  Filled 2014-11-09: qty 10

## 2014-11-09 MED ORDER — DEXTROSE 50 % IV SOLN
INTRAVENOUS | Status: AC
  Filled 2014-11-09: qty 50

## 2014-11-09 MED ORDER — ZOLPIDEM TARTRATE 5 MG PO TABS
5.0000 mg | ORAL_TABLET | Freq: Once | ORAL | Status: AC
  Administered 2014-11-09: 5 mg via ORAL
  Filled 2014-11-09: qty 1

## 2014-11-09 MED ORDER — DEXTROSE 50 % IV SOLN
25.0000 mL | Freq: Once | INTRAVENOUS | Status: AC | PRN
  Administered 2014-11-09: 25 mL via INTRAVENOUS

## 2014-11-09 NOTE — Progress Notes (Signed)
Utilization review completed.  

## 2014-11-09 NOTE — Progress Notes (Signed)
PROGRESS NOTE  Andrea [name]:[id number] DOB: May 18, 1951 DOA: 11/08/2014 PCP: Lolita Patella, MD  Assessment/Plan: SBO  -CT abd and pelvis: A few adjacent jejunal loops at the upper limits of normal diameter in the left mid to upper abdomen. Air is present throughout the colon. Findings are nonspecific and may be due to ileus versus early/partial obstructive process. Recommend serial follow-up abdominal films as clinically indicated. -daily abd x rays -NPO  -No active emesis-defer NG tube   SIRs- no source -likely secondary to above -meets criteria based on temp, HR, BP  -UA negative for infection -CXR w/o infiltrate -BC x 2  -noted resolved LUE abscess per pt-no active drainage -no leukocytosis  -panculture  -vanc and zosyn until cultures back -gently hydrate in setting of above w/ LLN BPs and decreased po intake  -cards c/s pending   chronic systolic heart failure, CAD  -s/p ICD  -on milrinone gtt and amiodarone -stable from a cardiac standpoint per heart failure clinic note today -noted LLN BPs in setting of above  -consult cards formally as current cardiac regimen may need to be titrated down given above  -hold hydralalazine -tele bed in stepdown  -cont to follow -f/u cards recs   type 2 DM  -SSI  -A1C  -sugars stable currently   hx/o CVA -no focal weakness/hemideficits currently  -follow   CKD  -at baseline on presentation  -follow   Hidradenitis -recently treated for abscess vs. Hidradenitis outpt  -clinically resolved per pt.  -follow   Code Status: full Family Communication: patient Disposition Plan: ? May transfer out SDU later today if stable   Consultants:  Cards in ER?  Procedures:     HPI/Subjective: No SOB, no CP No fever, no chills Not passing gas or having BM  Objective: Filed Vitals:   11/09/14 0700  BP: 99/54  Pulse: 101  Temp:   Resp: 22    Intake/Output Summary (Last 24 hours)  at 11/09/14 0810 Last data filed at 11/09/14 0700  Gross per 24 hour  Intake 979.06 ml  Output      0 ml  Net 979.06 ml   Filed Weights   11/08/14 2054  Weight: 84.823 kg (187 lb)    Exam:   General:  Sleeping- will awaken and answer questions  Cardiovascular: rrr  Respiratory: clear, no wheezing  Abdomen: +Bs, soft  Musculoskeletal: no edema  Data Reviewed: Basic Metabolic Panel:  Recent Labs Lab 11/08/14 1800 11/08/14 2000 11/09/14 0531  NA 140  --  140  K 4.2  --  3.9  CL 102  --  103  CO2 24  --  24  GLUCOSE 135*  --  104*  BUN 20  --  22  CREATININE 1.20* 1.41* 1.33*  CALCIUM 8.8  --  8.5   Liver Function Tests:  Recent Labs Lab 11/08/14 1800 11/09/14 0531  AST 32 28  ALT 29 25  ALKPHOS 127* 111  BILITOT 0.6 0.6  PROT 7.5 7.1  ALBUMIN 3.1* 2.8*    Recent Labs Lab 11/08/14 1800  LIPASE 14   No results for input(s): AMMONIA in the last 168 hours. CBC:  Recent Labs Lab 11/08/14 1800 11/08/14 2000 11/09/14 0531  WBC 7.9 7.4 5.4  NEUTROABS 5.2  --  2.9  HGB 10.4* 9.8* 9.4*  HCT 31.8* 30.1* 29.4*  MCV 66.5* 65.0* 66.8*  PLT 173 159 145*   Cardiac Enzymes:  Recent Labs Lab 11/08/14 2000 11/09/14 0141  TROPONINI <0.30 <0.30  BNP (last 3 results)  Recent Labs  03/08/14 0759 11/08/14 1800  PROBNP 2706.0* 2681.0*   CBG:  Recent Labs Lab 11/08/14 2103 11/08/14 2337 11/09/14 0409 11/09/14 0751  GLUCAP 115* 100* 106* 95    Recent Results (from the past 240 hour(s))  MRSA PCR Screening     Status: None   Collection Time: 11/08/14  9:10 PM  Result Value Ref Range Status   MRSA by PCR NEGATIVE NEGATIVE Final    Comment:        The GeneXpert MRSA Assay (FDA approved for NASAL specimens only), is one component of a comprehensive MRSA colonization surveillance program. It is not intended to diagnose MRSA infection nor to guide or monitor treatment for MRSA infections.      Studies: Ct Abdomen Pelvis Wo  Contrast  11/08/2014   CLINICAL DATA:  Nausea and vomiting since this morning. Possible small bowel obstruction on plain films earlier today.  EXAM: CT ABDOMEN AND PELVIS WITHOUT CONTRAST  TECHNIQUE: Multidetector CT imaging of the abdomen and pelvis was performed following the standard protocol without IV contrast.  COMPARISON:  Acute abdominal series earlier today. CT 06/07/2014 as well as 01/18/2014  FINDINGS: Lung bases demonstrate very small amount of bilateral pleural fluid with associated dependent atelectasis. Cardiac pacer leads are present over the heart.  Abdominal images demonstrate a normal liver, spleen, pancreas and adrenal glands. There is mild cholelithiasis. Kidneys are normal in size without evidence of hydronephrosis. There is a 1-2 mm calcification over the mid to lower pole collecting system of the left kidney which may be rib small stone versus vascular calcification. Ureters are within normal. The appendix is normal. There is mild calcified plaque over the abdominal aorta and iliac vessels. There is no free air or free peritoneal fluid. Air is stool present throughout the colon. There are a few small bowel loops in the left mid to upper abdomen at the upper limits of normal in diameter measuring 3.2 cm.  Pelvic images demonstrate a 2 cm calcified uterine fibroid. Uterus is chest to the right of midline. The bladder, left ovary and rectum are within normal. Right ovary not visualized. There are moderate degenerative changes of the hips. Mild loss of vertebral body height of several vertebrae over the thoracolumbar junction unchanged.  IMPRESSION: A few adjacent jejunal loops at the upper limits of normal diameter in the left mid to upper abdomen. Air is present throughout the colon. Findings are nonspecific and may be due to ileus versus early/partial obstructive process. Recommend serial follow-up abdominal films as clinically indicated.  Mild cholelithiasis.  1-2 mm nonobstructing left  renal stone.  2 cm uterine fibroid.  Mild loss of vertebral body height of several vertebrae at the thoracolumbar junction unchanged.   Electronically Signed   By: Elberta Fortisaniel  Boyle M.D.   On: 11/08/2014 23:01   Dg Abd Acute W/chest  11/08/2014   CLINICAL DATA:  Nausea and vomiting for 1 day. Cough. Fever. Periumbilical pain.  EXAM: ACUTE ABDOMEN SERIES (ABDOMEN 2 VIEW & CHEST 1 VIEW)  COMPARISON:  CT 06/07/2014.  FINDINGS: Blunting of both costophrenic angles is present compatible with small pleural effusions. Lower cervical ACDF. RIGHT upper extremity PICC with the tip in the mid SVC. LEFT subclavian AICD. The cardiopericardial silhouette is mildly enlarged.  There are dilated loops of small bowel within the LEFT central abdomen. Maximal diameter small bowel loops is 3.7 cm. There does appear to be colonic gas.  IMPRESSION: 1. Small bilateral pleural effusions. 2.  Mildly dilated loops of small bowel within the LEFT abdomen, suspicious for partial small bowel obstruction.   Electronically Signed   By: Andreas Newport M.D.   On: 11/08/2014 18:57    Scheduled Meds: . amiodarone  200 mg Oral Daily  . antiseptic oral rinse  7 mL Mouth Rinse BID  . DULoxetine  60 mg Oral Daily  . gabapentin  100 mg Oral BID  . heparin  5,000 Units Subcutaneous 3 times per day  . Influenza vac split quadrivalent PF  0.5 mL Intramuscular Tomorrow-1000  . insulin aspart  0-9 Units Subcutaneous 6 times per day  . magnesium oxide  400 mg Oral TID  . pantoprazole  40 mg Oral Daily  . piperacillin-tazobactam (ZOSYN)  IV  3.375 g Intravenous Q8H  . sodium chloride      . vancomycin  500 mg Intravenous Q12H   Continuous Infusions: . sodium chloride Stopped (11/08/14 2255)  . sodium chloride 50 mL/hr at 11/08/14 2255  . milrinone 0.375 mcg/kg/min (11/09/14 0742)   Antibiotics Given (last 72 hours)    Date/Time Action Medication Dose Rate   11/08/14 2251 Given   piperacillin-tazobactam (ZOSYN) IVPB 3.375 g 3.375 g 12.5  mL/hr   11/09/14 0007 Given   vancomycin (VANCOCIN) IVPB 1000 mg/200 mL premix 1,000 mg 200 mL/hr   11/09/14 0616 Given   piperacillin-tazobactam (ZOSYN) IVPB 3.375 g 3.375 g 12.5 mL/hr      Active Problems:   SBO (small bowel obstruction)   SIRS (systemic inflammatory response syndrome)    Time spent: 35 min    Moua Rasmusson  [facility] Pager [phone]. If 7PM-7AM, please contact night-coverage at [email or web address], password [email or web address] 11/09/2014, 8:10 AM  LOS: 1 day

## 2014-11-09 NOTE — Plan of Care (Signed)
Problem: Phase I Progression Outcomes Goal: Pain controlled with appropriate interventions Outcome: Completed/Met Date Met:  11/09/14 Goal: OOB as tolerated unless otherwise ordered Outcome: Completed/Met Date Met:  11/09/14 Goal: Initial discharge plan identified Outcome: Completed/Met Date Met:  11/09/14 Goal: Voiding-avoid urinary catheter unless indicated Outcome: Completed/Met Date Met:  11/09/14 Goal: Hemodynamically stable Outcome: Completed/Met Date Met:  11/09/14

## 2014-11-09 NOTE — Progress Notes (Signed)
[facility]  Patient Status: Active pt with [facility] prior to this admission  [facility] is providing the following services: [facility] and [facility] for home Milrinone. [facility] hospital team will follow [name] while she is here and support DC home when deemed appropriate.   If patient discharges after hours, please call [phone].   Andrea [name] 11/09/2014, 9:28 AM

## 2014-11-09 NOTE — Progress Notes (Signed)
Hypoglycemic Event  CBG: 61  Treatment: D50 IV 25 mL  Symptoms: None  Follow-up CBG: Time: 2000 CBG Result:100  Possible Reasons for Event: Inadequate meal intake  Comments/MD notified: Schorr, NP    Andrea [name] Y  Remember to initiate Hypoglycemia Order Set & complete

## 2014-11-09 NOTE — Progress Notes (Signed)
CARDIAC REHAB PHASE I   PRE:  Rate/Rhythm: 98 SR  BP:  Supine: 103/63  Sitting:   Standing:    SaO2: 99% 1.5 L ,  94%RA  MODE:  Ambulation: 700 ft   POST:  Rate/Rhythm: 125 ST  BP:  Supine:   Sitting: 134/60  Standing:    SaO2: 96%RA 1340-1414 Pt walked 700 ft on RA with rollator and minimal asst. Tolerated well. No DOE. To recliner after walk. Gait steady.   Luetta Nutting, RN BSN  11/09/2014 2:10 PM

## 2014-11-09 NOTE — Care Management Note (Addendum)
    Page 1 of 1   11/10/2014     4:28:11 PM CARE MANAGEMENT NOTE 11/10/2014  Patient:  KOULA, RUSSO   Account Number:  [id number]  Date Initiated:  11/09/2014  Documentation initiated by:  Donn Pierini  Subjective/Objective Assessment:   Pt admitted with ?SBO vs ileus, SIRs vs Sepsis     Action/Plan:   PTA pt lived at home with spouse- active with [facility] for [facility] services- RN and IV therapy with home IV milrinone- will need resumption orders at discharge   Anticipated DC Date:  11/12/2014   Anticipated DC Plan:  HOME W HOME HEALTH SERVICES      DC Planning Services  CM consult      [facility] Choice  Resumption Of Svcs/PTA Provider   Choice offered to / List presented to:             Status of service:  Completed, signed off Medicare Important Message given?  NO (If response is "NO", the following Medicare IM given date fields will be blank) Date Medicare IM given:   Medicare IM given by:   Date Additional Medicare IM given:   Additional Medicare IM given by:    Discharge Disposition:  HOME W HOME HEALTH SERVICES  Per UR Regulation:  Reviewed for med. necessity/level of care/duration of stay  If discussed at Long Length of Stay Meetings, dates discussed:    Comments:  11/10/14- 1000- Donn Pierini RN, BSN [phone] Per bedside RN- pt for d/c later today- notified Pam with [facility] for home IV milrinone which pt was on prior to admission. Pt may need home 02- bedside RN to reassess and let CM know if needed. update 1400- no home 02 needed- pt at baseline on RA

## 2014-11-09 NOTE — Consult Note (Signed)
Advanced Heart Failure Team History and Physical Note   Primary Physician: Dr. Elias Else [facility]) Primary Cardiologist: Dr. Gala Romney  Reason for Admission: N/V   HPI:    Andrea [name] is a 63 y.o. female with a history of CAD DES to prox RCA and stent to dist RCA in 2012, chronic systolic heart failure, ICM s/p ICD placement, liver failure thougth to be from cardiomyopathy -seen at [facility], CVA intolerant aspirin , CKD, GERD, and DM.  Admitted 03/08/14 with low output HF. Admitting creatinine was 1.3 but increased to 4.49. She was placed on Milrinone and diuresed with IV lasix. Milrinone was weaned off but restarted due to mixed venous saturation 39%. At that point she was discharged on Milrinone at 0.125 mcg via PICC. Discharge creatinine was 2.0. Discharge weight was 176 pounds. Milrinone subsequently titrated to 0.375 mcg/kg/min.  Seen in the HF clinic yesterday with N/V, fevers and fatigue. Volume status appeared at baseline and she was admitted for possible flu.   Review of Systems: [y] = yes, [ ]  = no   General: Weight gain [ ] ; Weight loss [ ] ; Anorexia [Y ]; Fatigue [ Y]; Fever [ Y]; Chills [ ] ; Weakness [Y]  Cardiac: Chest pain/pressure [ ] ; Resting SOB [ N]; Exertional SOB [Y ]; Myer Peer ]; Pedal Edema [ N]; Palpitations [ ] ; Syncope [ ] ; Presyncope [ ] ; Paroxysmal nocturnal dyspnea[ ]   Pulmonary: Cough [ ] ; Wheezing[ ] ; Hemoptysis[ ] ; Sputum [ ] ; Snoring [ ]   GI: Vomiting[ ] ; Dysphagia[ ] ; Melena[ ] ; Hematochezia [ ] ; Heartburn[ ] ; Abdominal pain [ ] ; Constipation [ ] ; Diarrhea [ ] ; BRBPR [ ]   GU: Hematuria[ ] ; Dysuria [ ] ; Nocturia[ ]   Vascular: Pain in legs with walking [ ] ; Pain in feet with lying flat [ ] ; Non-healing sores [ ] ; Stroke [ ] ; TIA [ ] ; Slurred speech [ ] ;  Neuro: Headaches[ ] ; Vertigo[ ] ; Seizures[ ] ; Paresthesias[ ] ;Blurred vision [ ] ; Diplopia [ ] ; Vision changes [ ]   Ortho/Skin: Arthritis [ ] ; Joint pain [Y ]; Muscle pain [ ] ; Joint  swelling [ ] ; Back Pain [ ] ; Rash [ ]   Psych: Depression[Y ]; Anxiety[ ]   Heme: Bleeding problems [ ] ; Clotting disorders [ ] ; Anemia [ ]   Endocrine: Diabetes [ ] ; Thyroid dysfunction[ ]   Home Medications Prior to Admission medications   Medication Sig Start Date End Date Taking? Authorizing Provider  amiodarone (PACERONE) 200 MG tablet Take 1 tablet (200 mg total) by mouth daily. 06/15/14  Yes Dolores Patty, MD  diclofenac sodium (VOLTAREN) 1 % GEL Apply 2 g topically 4 (four) times daily as needed (pain).    Yes Historical Provider, MD  DULoxetine (CYMBALTA) 60 MG capsule Take 60 mg by mouth daily.   Yes Historical Provider, MD  furosemide (LASIX) 40 MG tablet Take 1 tablet (40 mg total) by mouth 3 (three) times a week. Mon-Wed-Fri Patient taking differently: Take 40 mg by mouth every Monday, Wednesday, and Friday.  09/13/14  Yes Amy D Clegg, NP  gabapentin (NEURONTIN) 100 MG capsule Take 1 capsule (100 mg total) by mouth 2 (two) times daily. Patient taking differently: Take 200 mg by mouth at bedtime.  07/29/14  Yes Dolores Patty, MD  hydrALAZINE (APRESOLINE) 50 MG tablet Take 1 tablet (50 mg total) by mouth 3 (three) times daily. 10/11/14  Yes Amy D Clegg, NP  magnesium oxide (MAG-OX) 400 (241.3 MG) MG tablet Take 1 tablet (400 mg total) by mouth 3 (three)  times daily. 08/09/14  Yes Dolores Patty, MD  milrinone [facility]) 20 MG/100ML SOLN infusion Inject 30.225 mcg/min into the vein continuous. 04/12/14  Yes Aundria Rud, NP  nitroGLYCERIN (NITROSTAT) 0.4 MG SL tablet Place 0.4 mg under the tongue every 5 (five) minutes as needed for chest pain.   Yes Historical Provider, MD  omeprazole (PRILOSEC) 20 MG capsule Take 20 mg by mouth daily.   Yes Historical Provider, MD  potassium chloride SA (K-DUR,KLOR-CON) 20 MEQ tablet Take 1 tablet (20 mEq total) by mouth as directed. When you take extra lasix 08/13/14   Aundria Rud, NP    Past Medical History: Past Medical History   Diagnosis Date  . Chronic systolic heart failure   . Hypertension   . Stroke 02/2010    left brain CVA? recurrent TIA's treated with TPA  . Dyslipidemia   . Premature ventricular contractions (PVCs) (VPCs)   . GERD (gastroesophageal reflux disease)   . Coronary artery disease     a. DES-dRCA 11/2010. b. DES-pRCA 04/2011 (in an area free of disease on prior cath). c. s/p DES-mRCA 03/2013 for NSTEMI.  . Ischemic cardiomyopathy     a. Hx of medtronic ICD. b. EF 15-20% by cath 03/2013.  . Pulmonary hypertension   . Gout   . Obesity   . Thrombocytopenia   . Depression   . Hypotension     a. Admission 09/2012 for hypotn & ARF. b. Meds held 03/2013 due to hypotension.  . Acute renal insufficiency     a. 09/2012. b. Also noted post-cath 03/2013.  . NSTEMI (non-ST elevated myocardial infarction) 03/2013  . Anginal pain   . Asthma   . Microcytic anemia     a. Noted 03/2013 on labs, iron studies WNL.  Marland Kitchen Anxiety   . CHF (congestive heart failure)   . CKD (chronic kidney disease) stage 3, GFR 30-59 ml/min   . HTN (hypertension)   . Primary pulmonary HTN   . Automatic implantable cardiac defibrillator - Medtronic 08/24/2013  . Heart murmur   . Type II diabetes mellitus   . Migraine headache     "no pain; aura/visual problems only; at least 2-3X/month" (01/18/2014)  . Arthritis     "knees" (01/18/2014)  . DJD (degenerative joint disease)   . Bipolar disorder     Past Surgical History: Past Surgical History  Procedure Laterality Date  . Mass excision Left     hip  . Cervical polypectomy  1970's  . US echocardiography  2011  . Tonsillectomy  1960's?  . Right oophorectomy  1980's  . Dilation and curettage of uterus  1970's  . Ostectomy metatarsal Left 1993    "5th toe; from rickets" (05/07/2013)  . Implantable cardioverter defibrillator implant  ~ 2008  . Coronary angioplasty with stent placement  2000's    "2 stents" (05/07/2013)  . Coronary angioplasty with stent placement  03/2013     PCI to RCA/notes 05/05/2013; "makes total of 3" (05/07/2013)    Family History: Family History  Problem Relation Age of Onset  . Heart failure Mother   . Heart attack Father   . Heart disease Brother     Social History: History   Social History  . Marital Status: Single    Spouse Name: N/A    Number of Children: 1  . Years of Education: N/A   Occupational History  .     Social History Main Topics  . Smoking status: Former Smoker -- 0.05 packs/day  for 5 years    Types: Cigarettes    Quit date: 04/23/1982  . Smokeless tobacco: Never Used  . Alcohol Use: No  . Drug Use: No  . Sexual Activity: Yes   Other Topics Concern  . None   Social History Narrative   ** Merged History Encounter **        Allergies:  Allergies  Allergen Reactions  . Asa [Aspirin] Shortness Of Breath  . Aspirin Shortness Of Breath and Other (See Comments)    Asthma symptoms  . Brilinta [Ticagrelor] Shortness Of Breath and Other (See Comments)    Gout   . Percocet [Oxycodone-Acetaminophen] Nausea And Vomiting  . Darvon Nausea And Vomiting  . Imitrex [Sumatriptan Base] Other (See Comments)    Unknown reaction  . Licorice Flavor [Artificial Licorice Flavor]     Black licorice induces asthma   . Nsaids Nausea And Vomiting  . Nsaids Other (See Comments)    GI upset     Objective:    Vital Signs:   Temp:  [97.7 F (36.5 C)-101.1 F (38.4 C)] 97.7 F (36.5 C) (11/10 0700) Pulse Rate:  [95-125] 102 (11/10 1000) Resp:  [16-23] 21 (11/10 1000) BP: (80-154)/(0-82) 93/48 mmHg (11/10 0900) SpO2:  [91 %-100 %] 97 % (11/10 1000) Weight:  [187 lb (84.823 kg)-187 lb 12 oz (85.163 kg)] 187 lb (84.823 kg) (11/09 2054)   Filed Weights   11/08/14 2054  Weight: 187 lb (84.823 kg)    Physical Exam: General: No acute distress. No resp difficulty. Lying flat HEENT: normal Neck: supple. JVP 6-7. Carotids 2+ bilaterally; no bruits. No lymphadenopathy or thryomegaly appreciated. Cor: PMI normal.  Regular rate & rhythm. No rubs, or murmurs. No S3  Lungs: clear Abdomen: soft, mildly tender, nondistended. No hepatosplenomegaly. No bruits or masses. Hypoactive BS Extremities: no cyanosis, clubbing, rash, edema, RUE 2 lumen PICC Neuro: alert & orientedx3, cranial nerves grossly intact. Moves all 4 extremities w/o difficulty. Affect pleasant  Telemetry: ST 100s  Labs: Basic Metabolic Panel:  Recent Labs Lab 11/08/14 1800 11/08/14 2000 11/09/14 0531  NA 140  --  140  K 4.2  --  3.9  CL 102  --  103  CO2 24  --  24  GLUCOSE 135*  --  104*  BUN 20  --  22  CREATININE 1.20* 1.41* 1.33*  CALCIUM 8.8  --  8.5    Liver Function Tests:  Recent Labs Lab 11/08/14 1800 11/09/14 0531  AST 32 28  ALT 29 25  ALKPHOS 127* 111  BILITOT 0.6 0.6  PROT 7.5 7.1  ALBUMIN 3.1* 2.8*    Recent Labs Lab 11/08/14 1800  LIPASE 14   No results for input(s): AMMONIA in the last 168 hours.  CBC:  Recent Labs Lab 11/08/14 1800 11/08/14 2000 11/09/14 0531  WBC 7.9 7.4 5.4  NEUTROABS 5.2  --  2.9  HGB 10.4* 9.8* 9.4*  HCT 31.8* 30.1* 29.4*  MCV 66.5* 65.0* 66.8*  PLT 173 159 145*    Cardiac Enzymes:  Recent Labs Lab 11/08/14 2000 11/09/14 0141 11/09/14 0805  TROPONINI <0.30 <0.30 <0.30    BNP: BNP (last 3 results)  Recent Labs  03/08/14 0759 11/08/14 1800  PROBNP 2706.0* 2681.0*    CBG:  Recent Labs Lab 11/08/14 2103 11/08/14 2337 11/09/14 0409 11/09/14 0751  GLUCAP 115* 100* 106* 95    Coagulation Studies: No results for input(s): LABPROT, INR in the last 72 hours.  Other results: EKG: ST  Imaging:  Ct Abdomen Pelvis Wo Contrast  11/08/2014   CLINICAL DATA:  Nausea and vomiting since this morning. Possible small bowel obstruction on plain films earlier today.  EXAM: CT ABDOMEN AND PELVIS WITHOUT CONTRAST  TECHNIQUE: Multidetector CT imaging of the abdomen and pelvis was performed following the standard protocol without IV contrast.  COMPARISON:   Acute abdominal series earlier today. CT 06/07/2014 as well as 01/18/2014  FINDINGS: Lung bases demonstrate very small amount of bilateral pleural fluid with associated dependent atelectasis. Cardiac pacer leads are present over the heart.  Abdominal images demonstrate a normal liver, spleen, pancreas and adrenal glands. There is mild cholelithiasis. Kidneys are normal in size without evidence of hydronephrosis. There is a 1-2 mm calcification over the mid to lower pole collecting system of the left kidney which may be rib small stone versus vascular calcification. Ureters are within normal. The appendix is normal. There is mild calcified plaque over the abdominal aorta and iliac vessels. There is no free air or free peritoneal fluid. Air is stool present throughout the colon. There are a few small bowel loops in the left mid to upper abdomen at the upper limits of normal in diameter measuring 3.2 cm.  Pelvic images demonstrate a 2 cm calcified uterine fibroid. Uterus is chest to the right of midline. The bladder, left ovary and rectum are within normal. Right ovary not visualized. There are moderate degenerative changes of the hips. Mild loss of vertebral body height of several vertebrae over the thoracolumbar junction unchanged.  IMPRESSION: A few adjacent jejunal loops at the upper limits of normal diameter in the left mid to upper abdomen. Air is present throughout the colon. Findings are nonspecific and may be due to ileus versus early/partial obstructive process. Recommend serial follow-up abdominal films as clinically indicated.  Mild cholelithiasis.  1-2 mm nonobstructing left renal stone.  2 cm uterine fibroid.  Mild loss of vertebral body height of several vertebrae at the thoracolumbar junction unchanged.   Electronically Signed   By: Elberta Fortis M.D.   On: 11/08/2014 23:01   Dg Abd Acute W/chest  11/08/2014   CLINICAL DATA:  Nausea and vomiting for 1 day. Cough. Fever. Periumbilical pain.  EXAM:  ACUTE ABDOMEN SERIES (ABDOMEN 2 VIEW & CHEST 1 VIEW)  COMPARISON:  CT 06/07/2014.  FINDINGS: Blunting of both costophrenic angles is present compatible with small pleural effusions. Lower cervical ACDF. RIGHT upper extremity PICC with the tip in the mid SVC. LEFT subclavian AICD. The cardiopericardial silhouette is mildly enlarged.  There are dilated loops of small bowel within the LEFT central abdomen. Maximal diameter small bowel loops is 3.7 cm. There does appear to be colonic gas.  IMPRESSION: 1. Small bilateral pleural effusions. 2. Mildly dilated loops of small bowel within the LEFT abdomen, suspicious for partial small bowel obstruction.   Electronically Signed   By: Andreas Newport M.D.   On: 11/08/2014 18:57         Assessment:   1) Chronic systolic HF - EF 16%, RV ok (off milrinone) EF 30-35% on milrinone 2) ICM 3) CKD stage III 4) CAD - s/p PCI to DES to prox RCA and stent to distal RCA 2012 5) SVT 6) Palpitations 7) DM2 8) SBO vs Illeus  Plan/Discussion:    Admitted yesterday from the ED after being seen in the HF clinic for N/V, fevers and weakness. Concern for possible flu.   Had CT abdomen and concern for possible illeus vs SBO. Primary team managing and she  is NPO currently. Denies any nausea or abdominal pain.   Volume status stable. Will continue to hold diuretics currently. Continue milrinone 0.375 mcg thru PICC. Will check co-ox.   SBP 90s, holding hydralazine.   Consult CR for ambulation  Length of Stay: 1 Aundria Rud NP-C 11/09/2014, 10:44 AM  Advanced Heart Failure Team Pager [phone] (M-F; 7a - 4p)  Please contact [facility] for night-coverage after hours (4p -7a ) and weekends on [email or web address]  Looks better today. Lying flat. Co-ox looks good. Will hold diuretics today. Suspect she likely has viral syndrome. Appreciate IM care. Would continue milrinone. We will follow.   Tasman Zapata,MD 12:29 PM

## 2014-11-10 ENCOUNTER — Encounter: Payer: Self-pay | Admitting: Internal Medicine

## 2014-11-10 ENCOUNTER — Inpatient Hospital Stay (HOSPITAL_COMMUNITY): Payer: BC Managed Care – PPO

## 2014-11-10 DIAGNOSIS — K567 Ileus, unspecified: Secondary | ICD-10-CM

## 2014-11-10 DIAGNOSIS — E119 Type 2 diabetes mellitus without complications: Secondary | ICD-10-CM

## 2014-11-10 DIAGNOSIS — I5023 Acute on chronic systolic (congestive) heart failure: Secondary | ICD-10-CM

## 2014-11-10 LAB — CBC WITH DIFFERENTIAL/PLATELET
Basophils Absolute: 0 10*3/uL (ref 0.0–0.1)
Basophils Relative: 1 % (ref 0–1)
EOS ABS: 0.3 10*3/uL (ref 0.0–0.7)
Eosinophils Relative: 7 % — ABNORMAL HIGH (ref 0–5)
HCT: 26.6 % — ABNORMAL LOW (ref 36.0–46.0)
Hemoglobin: 8.5 g/dL — ABNORMAL LOW (ref 12.0–15.0)
LYMPHS ABS: 1.1 10*3/uL (ref 0.7–4.0)
Lymphocytes Relative: 28 % (ref 12–46)
MCH: 21.5 pg — ABNORMAL LOW (ref 26.0–34.0)
MCHC: 32 g/dL (ref 30.0–36.0)
MCV: 67.2 fL — AB (ref 78.0–100.0)
MONO ABS: 0.4 10*3/uL (ref 0.1–1.0)
Monocytes Relative: 10 % (ref 3–12)
Neutro Abs: 2.1 10*3/uL (ref 1.7–7.7)
Neutrophils Relative %: 54 % (ref 43–77)
Platelets: 139 10*3/uL — ABNORMAL LOW (ref 150–400)
RBC: 3.96 MIL/uL (ref 3.87–5.11)
RDW: 17.2 % — AB (ref 11.5–15.5)
Smear Review: DECREASED
WBC: 3.9 10*3/uL — ABNORMAL LOW (ref 4.0–10.5)

## 2014-11-10 LAB — CBC
HCT: 28.2 % — ABNORMAL LOW (ref 36.0–46.0)
Hemoglobin: 9 g/dL — ABNORMAL LOW (ref 12.0–15.0)
MCH: 21.4 pg — AB (ref 26.0–34.0)
MCHC: 31.9 g/dL (ref 30.0–36.0)
MCV: 67 fL — ABNORMAL LOW (ref 78.0–100.0)
PLATELETS: 152 10*3/uL (ref 150–400)
RBC: 4.21 MIL/uL (ref 3.87–5.11)
RDW: 17.3 % — ABNORMAL HIGH (ref 11.5–15.5)
WBC: 3.9 10*3/uL — AB (ref 4.0–10.5)

## 2014-11-10 LAB — COMPREHENSIVE METABOLIC PANEL
ALT: 20 U/L (ref 0–35)
AST: 25 U/L (ref 0–37)
Albumin: 2.3 g/dL — ABNORMAL LOW (ref 3.5–5.2)
Alkaline Phosphatase: 85 U/L (ref 39–117)
Anion gap: 12 (ref 5–15)
BILIRUBIN TOTAL: 0.4 mg/dL (ref 0.3–1.2)
BUN: 19 mg/dL (ref 6–23)
CHLORIDE: 110 meq/L (ref 96–112)
CO2: 20 mEq/L (ref 19–32)
Calcium: 7.4 mg/dL — ABNORMAL LOW (ref 8.4–10.5)
Creatinine, Ser: 1.12 mg/dL — ABNORMAL HIGH (ref 0.50–1.10)
GFR calc Af Amer: 59 mL/min — ABNORMAL LOW (ref >90)
GFR, EST NON AFRICAN AMERICAN: 51 mL/min — AB (ref >90)
Glucose, Bld: 64 mg/dL — ABNORMAL LOW (ref 70–99)
Potassium: 3.5 mEq/L — ABNORMAL LOW (ref 3.7–5.3)
SODIUM: 142 meq/L (ref 137–147)
Total Protein: 5.9 g/dL — ABNORMAL LOW (ref 6.0–8.3)

## 2014-11-10 LAB — GLUCOSE, CAPILLARY
GLUCOSE-CAPILLARY: 74 mg/dL (ref 70–99)
GLUCOSE-CAPILLARY: 70 mg/dL (ref 70–99)
Glucose-Capillary: 76 mg/dL (ref 70–99)
Glucose-Capillary: 79 mg/dL (ref 70–99)

## 2014-11-10 LAB — MAGNESIUM: Magnesium: 1.9 mg/dL (ref 1.5–2.5)

## 2014-11-10 NOTE — Consult Note (Signed)
Advanced Heart Failure Team History and Physical Note   Primary Physician: Dr. Elias Else [facility]) Primary Cardiologist: Dr. Gala Romney  Reason for Admission: N/V   HPI:    Feels good. Hungry. No SOB. HR back down to 90s.   Allergies:  Allergies  Allergen Reactions  . Asa [Aspirin] Shortness Of Breath  . Aspirin Shortness Of Breath and Other (See Comments)    Asthma symptoms  . Brilinta [Ticagrelor] Shortness Of Breath and Other (See Comments)    Gout   . Percocet [Oxycodone-Acetaminophen] Nausea And Vomiting  . Darvon Nausea And Vomiting  . Imitrex [Sumatriptan Base] Other (See Comments)    Unknown reaction  . Licorice Flavor [Artificial Licorice Flavor]     Black licorice induces asthma   . Nsaids Nausea And Vomiting  . Nsaids Other (See Comments)    GI upset     Objective:    Vital Signs:   Temp:  [97.4 F (36.3 C)-98.7 F (37.1 C)] 97.6 F (36.4 C) (11/11 0700) Pulse Rate:  [94-104] 96 (11/11 0700) Resp:  [13-29] 21 (11/11 0700) BP: (93-118)/(48-69) 104/65 mmHg (11/11 0700) SpO2:  [88 %-99 %] 97 % (11/11 0700) Last BM Date: 11/08/14 Filed Weights   11/08/14 2054  Weight: 84.823 kg (187 lb)    Physical Exam: General: No acute distress. No resp difficulty. Lying flat HEENT: normal Neck: supple. JVP 6-7. Carotids 2+ bilaterally; no bruits. No lymphadenopathy or thryomegaly appreciated. Cor: PMI normal. Regular rate & rhythm. No rubs, or murmurs. No S3  Lungs: clear Abdomen: soft, mildly tender, nondistended. No hepatosplenomegaly. No bruits or masses. Hypoactive BS Extremities: no cyanosis, clubbing, rash, edema, RUE 2 lumen PICC Neuro: alert & orientedx3, cranial nerves grossly intact. Moves all 4 extremities w/o difficulty. Affect pleasant  Telemetry: SR 90s  Labs: Basic Metabolic Panel:  Recent Labs Lab 11/08/14 1800 11/08/14 2000 11/09/14 0531 11/10/14 0400  NA 140  --  140 142  K 4.2  --  3.9 3.5*  CL 102  --  103 110  CO2  24  --  24 20  GLUCOSE 135*  --  104* 64*  BUN 20  --  22 19  CREATININE 1.20* 1.41* 1.33* 1.12*  CALCIUM 8.8  --  8.5 7.4*  MG  --   --   --  1.9    Liver Function Tests:  Recent Labs Lab 11/08/14 1800 11/09/14 0531 11/10/14 0400  AST 32 28 25  ALT 29 25 20   ALKPHOS 127* 111 85  BILITOT 0.6 0.6 0.4  PROT 7.5 7.1 5.9*  ALBUMIN 3.1* 2.8* 2.3*    Recent Labs Lab 11/08/14 1800  LIPASE 14   No results for input(s): AMMONIA in the last 168 hours.  CBC:  Recent Labs Lab 11/08/14 1800 11/08/14 2000 11/09/14 0531 11/10/14 0400 11/10/14 0542  WBC 7.9 7.4 5.4 3.9* 3.9*  NEUTROABS 5.2  --  2.9 2.1  --   HGB 10.4* 9.8* 9.4* 8.5* 9.0*  HCT 31.8* 30.1* 29.4* 26.6* 28.2*  MCV 66.5* 65.0* 66.8* 67.2* 67.0*  PLT 173 159 145* 139* 152    Cardiac Enzymes:  Recent Labs Lab 11/08/14 2000 11/09/14 0141 11/09/14 0805  TROPONINI <0.30 <0.30 <0.30    BNP: BNP (last 3 results)  Recent Labs  03/08/14 0759 11/08/14 1800  PROBNP 2706.0* 2681.0*    CBG:  Recent Labs Lab 11/09/14 1920 11/09/14 1955 11/09/14 2332 11/10/14 0141 11/10/14 0342  GLUCAP 61* 100* 71 74 76    Coagulation  Studies: No results for input(s): LABPROT, INR in the last 72 hours.  Other results: EKG: ST  Imaging: Ct Abdomen Pelvis Wo Contrast  11/08/2014   CLINICAL DATA:  Nausea and vomiting since this morning. Possible small bowel obstruction on plain films earlier today.  EXAM: CT ABDOMEN AND PELVIS WITHOUT CONTRAST  TECHNIQUE: Multidetector CT imaging of the abdomen and pelvis was performed following the standard protocol without IV contrast.  COMPARISON:  Acute abdominal series earlier today. CT 06/07/2014 as well as 01/18/2014  FINDINGS: Lung bases demonstrate very small amount of bilateral pleural fluid with associated dependent atelectasis. Cardiac pacer leads are present over the heart.  Abdominal images demonstrate a normal liver, spleen, pancreas and adrenal glands. There is mild  cholelithiasis. Kidneys are normal in size without evidence of hydronephrosis. There is a 1-2 mm calcification over the mid to lower pole collecting system of the left kidney which may be rib small stone versus vascular calcification. Ureters are within normal. The appendix is normal. There is mild calcified plaque over the abdominal aorta and iliac vessels. There is no free air or free peritoneal fluid. Air is stool present throughout the colon. There are a few small bowel loops in the left mid to upper abdomen at the upper limits of normal in diameter measuring 3.2 cm.  Pelvic images demonstrate a 2 cm calcified uterine fibroid. Uterus is chest to the right of midline. The bladder, left ovary and rectum are within normal. Right ovary not visualized. There are moderate degenerative changes of the hips. Mild loss of vertebral body height of several vertebrae over the thoracolumbar junction unchanged.  IMPRESSION: A few adjacent jejunal loops at the upper limits of normal diameter in the left mid to upper abdomen. Air is present throughout the colon. Findings are nonspecific and may be due to ileus versus early/partial obstructive process. Recommend serial follow-up abdominal films as clinically indicated.  Mild cholelithiasis.  1-2 mm nonobstructing left renal stone.  2 cm uterine fibroid.  Mild loss of vertebral body height of several vertebrae at the thoracolumbar junction unchanged.   Electronically Signed   By: Elberta Fortisaniel  Boyle M.D.   On: 11/08/2014 23:01   Dg Abd Acute W/chest  11/08/2014   CLINICAL DATA:  Nausea and vomiting for 1 day. Cough. Fever. Periumbilical pain.  EXAM: ACUTE ABDOMEN SERIES (ABDOMEN 2 VIEW & CHEST 1 VIEW)  COMPARISON:  CT 06/07/2014.  FINDINGS: Blunting of both costophrenic angles is present compatible with small pleural effusions. Lower cervical ACDF. RIGHT upper extremity PICC with the tip in the mid SVC. LEFT subclavian AICD. The cardiopericardial silhouette is mildly enlarged.  There  are dilated loops of small bowel within the LEFT central abdomen. Maximal diameter small bowel loops is 3.7 cm. There does appear to be colonic gas.  IMPRESSION: 1. Small bilateral pleural effusions. 2. Mildly dilated loops of small bowel within the LEFT abdomen, suspicious for partial small bowel obstruction.   Electronically Signed   By: Andreas NewportGeoffrey  Lamke M.D.   On: 11/08/2014 18:57   Dg Abd Portable 2v  11/10/2014   CLINICAL DATA:  Ileus  EXAM: PORTABLE ABDOMEN - 2 VIEW  COMPARISON:  Portable exam 0604 hr compared to 11/08/2014  FINDINGS: Retained contrast in colon.  Nonobstructive bowel gas pattern.  No bowel dilatation or bowel wall thickening.  Air-filled loops of small bowel in the LEFT abdomen are now normal in caliber.  Pacemaker lead projects over RIGHT ventricle.  Bibasilar infiltrates.  Osseous demineralization with scoliosis, degenerative disc/facet  disease changes and scattered compression deformities of the lower thoracic and lumbar spine.  IMPRESSION: Decreased small bowel dilatation since previous exam.  Bowel gas pattern normal.   Electronically Signed   By: Ulyses Southward M.D.   On: 11/10/2014 08:02        Assessment:   1) Chronic systolic HF - EF 16%, RV ok (off milrinone) EF 30-35% on milrinone 2) ICM 3) CKD stage III 4) CAD - s/p PCI to DES to prox RCA and stent to distal RCA 2012 5) SVT 6) Palpitations 7) DM2 8) SBO vs Illeus  Plan/Discussion:     Looks great. Lying flat. Good appetite. Viral syndrome likely resolved. Can go home today. Please resume all home medications. Can f/u in HF Clinic next week.   Daniel Bensimhon,MD 8:07 AM

## 2014-11-10 NOTE — Discharge Summary (Signed)
Physician Discharge Summary  [name] Viloria UJW:[id number] DOB: Sep 21, 1951 DOA: 11/08/2014  PCP: Lolita PatellaEADE,ROBERT ALEXANDER, MD  Admit date: 11/08/2014 Discharge date: 11/10/2014  Time spent: 35 minutes  Recommendations for Outpatient Follow-up:  1. Follow up on Blood cultures- 1/2 appears to be contaminant 2. HF follow up  Discharge Diagnoses:  Active Problems:   SBO (small bowel obstruction)   SIRS (systemic inflammatory response syndrome)   Discharge Condition: improved  Diet recommendation: cardiac/diabetic  Filed Weights   11/08/14 2054  Weight: 84.823 kg (187 lb)    History of present illness:  This is a 63 y.o. year old female with significant past medical history of chronic systolic heart failure s/p ICD on milrinone and amiodarone, Asthma, CAD s/p stenting, type 2 DM, hx/o CVA, pulm HTN presenting with SBO, SIRS vs. Sepsis, weakness. Pt states that she has had generalized malaise over the past 1-2 days. Felt generally weak yesterday with subjective fevers and chills. Symptoms progressively worsened over the course of the day.Had one epsiode of ? Bilious vomiting today. Denies any diarrhea. Mild intermittent SOB. No CP. Went to appt at heart failure clinic w Dr. Gala RomneyBensimhon. Stable from a cardiac standpoint. However, pt w/ profound weakness to the point that pt felt unable to go ho home. Pt was transferred to ER for further evaluation.   Hospital Course:  SBO  -CT abd and pelvis: A few adjacent jejunal loops at the upper limits of normal diameter in the left mid to upper abdomen. Air is present throughout the colon. Findings are nonspecific and may be due to ileus versus early/partial obstructive process. -x ray shows resolution--- patient passing gas -daily abd x rays -never required NG tube -eating well  SIRs- no source -likely secondary to above -meets criteria based on temp, HR, BP  -UA negative for infection -CXR w/o infiltrate -1/2 BC + for contaminant -noted  resolved LUE abscess per pt-no active drainage -no leukocytosis   chronic systolic heart failure, CAD  -s/p ICD  -on milrinone gtt and amiodarone  type 2 DM   hx/o CVA -no focal weakness/hemideficits currently  -follow   CKD  -at baseline on presentation  -follow   Hidradenitis -recently treated for abscess vs. Hidradenitis outpt  -clinically resolved per pt.  -follow   Procedures:  none  Consultations:  cards  Discharge Exam: Filed Vitals:   11/10/14 1124  BP: 117/63  Pulse: 95  Temp: 97.7 F (36.5 C)  Resp: 17    General: A+Ox3, NAD Cardiovascular: rrr Respiratory: clear  Discharge Instructions You were cared for by a hospitalist during your hospital stay. If you have any questions about your discharge medications or the care you received while you were in the hospital after you are discharged, you can call the unit and asked to speak with the hospitalist on call if the hospitalist that took care of you is not available. Once you are discharged, your primary care physician will handle any further medical issues. Please note that NO REFILLS for any discharge medications will be authorized once you are discharged, as it is imperative that you return to your primary care physician (or establish a relationship with a primary care physician if you do not have one) for your aftercare needs so that they can reassess your need for medications and monitor your lab values.  Discharge Instructions    Diet - low sodium heart healthy    Complete by:  As directed      Diet Carb Modified  Complete by:  As directed      Discharge instructions    Complete by:  As directed   Follow up in HF clinic 1 week     Increase activity slowly    Complete by:  As directed           Current Discharge Medication List    CONTINUE these medications which have NOT CHANGED   Details  amiodarone (PACERONE) 200 MG tablet Take 1 tablet (200 mg total) by mouth daily. Qty: 90  tablet, Refills: 3    diclofenac sodium (VOLTAREN) 1 % GEL Apply 2 g topically 4 (four) times daily as needed (pain).     DULoxetine (CYMBALTA) 60 MG capsule Take 60 mg by mouth daily.    furosemide (LASIX) 40 MG tablet Take 1 tablet (40 mg total) by mouth 3 (three) times a week. Mon-Wed-Fri Qty: 30 tablet, Refills: 6    gabapentin (NEURONTIN) 100 MG capsule Take 1 capsule (100 mg total) by mouth 2 (two) times daily. Qty: 60 capsule, Refills: 0    magnesium oxide (MAG-OX) 400 (241.3 MG) MG tablet Take 1 tablet (400 mg total) by mouth 3 (three) times daily. Qty: 90 tablet, Refills: 6   Associated Diagnoses: Chronic systolic heart failure    milrinone (PRIMACOR) 20 MG/100ML SOLN infusion Inject 30.225 mcg/min into the vein continuous. Qty: 100 mL    nitroGLYCERIN (NITROSTAT) 0.4 MG SL tablet Place 0.4 mg under the tongue every 5 (five) minutes as needed for chest pain.    omeprazole (PRILOSEC) 20 MG capsule Take 20 mg by mouth daily.    potassium chloride SA (K-DUR,KLOR-CON) 20 MEQ tablet Take 1 tablet (20 mEq total) by mouth as directed. When you take extra lasix Qty: 30 tablet, Refills: 3      STOP taking these medications     hydrALAZINE (APRESOLINE) 50 MG tablet        Allergies  Allergen Reactions  . Asa [Aspirin] Shortness Of Breath  . Aspirin Shortness Of Breath and Other (See Comments)    Asthma symptoms  . Brilinta [Ticagrelor] Shortness Of Breath and Other (See Comments)    Gout   . Percocet [Oxycodone-Acetaminophen] Nausea And Vomiting  . Darvon Nausea And Vomiting  . Imitrex [Sumatriptan Base] Other (See Comments)    Unknown reaction  . Licorice Flavor [Artificial Licorice Flavor]     Black licorice induces asthma   . Nsaids Nausea And Vomiting  . Nsaids Other (See Comments)    GI upset       The results of significant diagnostics from this hospitalization (including imaging, microbiology, ancillary and laboratory) are listed below for reference.     Significant Diagnostic Studies: Ct Abdomen Pelvis Wo Contrast  11/08/2014   CLINICAL DATA:  Nausea and vomiting since this morning. Possible small bowel obstruction on plain films earlier today.  EXAM: CT ABDOMEN AND PELVIS WITHOUT CONTRAST  TECHNIQUE: Multidetector CT imaging of the abdomen and pelvis was performed following the standard protocol without IV contrast.  COMPARISON:  Acute abdominal series earlier today. CT 06/07/2014 as well as 01/18/2014  FINDINGS: Lung bases demonstrate very small amount of bilateral pleural fluid with associated dependent atelectasis. Cardiac pacer leads are present over the heart.  Abdominal images demonstrate a normal liver, spleen, pancreas and adrenal glands. There is mild cholelithiasis. Kidneys are normal in size without evidence of hydronephrosis. There is a 1-2 mm calcification over the mid to lower pole collecting system of the left kidney which may be rib small  stone versus vascular calcification. Ureters are within normal. The appendix is normal. There is mild calcified plaque over the abdominal aorta and iliac vessels. There is no free air or free peritoneal fluid. Air is stool present throughout the colon. There are a few small bowel loops in the left mid to upper abdomen at the upper limits of normal in diameter measuring 3.2 cm.  Pelvic images demonstrate a 2 cm calcified uterine fibroid. Uterus is chest to the right of midline. The bladder, left ovary and rectum are within normal. Right ovary not visualized. There are moderate degenerative changes of the hips. Mild loss of vertebral body height of several vertebrae over the thoracolumbar junction unchanged.  IMPRESSION: A few adjacent jejunal loops at the upper limits of normal diameter in the left mid to upper abdomen. Air is present throughout the colon. Findings are nonspecific and may be due to ileus versus early/partial obstructive process. Recommend serial follow-up abdominal films as clinically  indicated.  Mild cholelithiasis.  1-2 mm nonobstructing left renal stone.  2 cm uterine fibroid.  Mild loss of vertebral body height of several vertebrae at the thoracolumbar junction unchanged.   Electronically Signed   By: Elberta Fortis M.D.   On: 11/08/2014 23:01   Dg Abd Acute W/chest  11/08/2014   CLINICAL DATA:  Nausea and vomiting for 1 day. Cough. Fever. Periumbilical pain.  EXAM: ACUTE ABDOMEN SERIES (ABDOMEN 2 VIEW & CHEST 1 VIEW)  COMPARISON:  CT 06/07/2014.  FINDINGS: Blunting of both costophrenic angles is present compatible with small pleural effusions. Lower cervical ACDF. RIGHT upper extremity PICC with the tip in the mid SVC. LEFT subclavian AICD. The cardiopericardial silhouette is mildly enlarged.  There are dilated loops of small bowel within the LEFT central abdomen. Maximal diameter small bowel loops is 3.7 cm. There does appear to be colonic gas.  IMPRESSION: 1. Small bilateral pleural effusions. 2. Mildly dilated loops of small bowel within the LEFT abdomen, suspicious for partial small bowel obstruction.   Electronically Signed   By: Andreas Newport M.D.   On: 11/08/2014 18:57   Dg Abd Portable 2v  11/10/2014   CLINICAL DATA:  Ileus  EXAM: PORTABLE ABDOMEN - 2 VIEW  COMPARISON:  Portable exam 0604 hr compared to 11/08/2014  FINDINGS: Retained contrast in colon.  Nonobstructive bowel gas pattern.  No bowel dilatation or bowel wall thickening.  Air-filled loops of small bowel in the LEFT abdomen are now normal in caliber.  Pacemaker lead projects over RIGHT ventricle.  Bibasilar infiltrates.  Osseous demineralization with scoliosis, degenerative disc/facet disease changes and scattered compression deformities of the lower thoracic and lumbar spine.  IMPRESSION: Decreased small bowel dilatation since previous exam.  Bowel gas pattern normal.   Electronically Signed   By: Ulyses Southward M.D.   On: 11/10/2014 08:02    Microbiology: Recent Results (from the past 240 hour(s))  Culture,  blood (routine x 2)     Status: None (Preliminary result)   Collection Time: 11/08/14  8:10 PM  Result Value Ref Range Status   Specimen Description BLOOD LEFT ARM  Final   Special Requests BOTTLES DRAWN AEROBIC AND ANAEROBIC 10CC  Final   Culture  Setup Time   Final    11/09/2014 01:22 Performed at [facility]    Culture   Final    GRAM POSITIVE COCCI IN CLUSTERS Note: Gram Stain Report Called to,Read Back By and Verified With: MATT YORK ON 11/09/2014 AT 11:28P BY WILEJ Performed at [facility]    Report Status PENDING  Incomplete  Culture, blood (routine x 2)     Status: None (Preliminary result)   Collection Time: 11/08/14  8:15 PM  Result Value Ref Range Status   Specimen Description BLOOD LEFT HAND  Final   Special Requests BOTTLES DRAWN AEROBIC ONLY 10CC  Final   Culture  Setup Time   Final    11/09/2014 01:22 Performed at [facility]    Culture   Final           BLOOD CULTURE RECEIVED NO GROWTH TO DATE CULTURE WILL BE HELD FOR 5 DAYS BEFORE ISSUING A FINAL NEGATIVE REPORT Performed at [facility]    Report Status PENDING  Incomplete  MRSA PCR Screening     Status: None   Collection Time: 11/08/14  9:10 PM  Result Value Ref Range Status   MRSA by PCR NEGATIVE NEGATIVE Final    Comment:        The GeneXpert MRSA Assay (FDA approved for NASAL specimens only), is one component of a comprehensive MRSA colonization surveillance program. It is not intended to diagnose MRSA infection nor to guide or monitor treatment for MRSA infections.      Labs: Basic Metabolic Panel:  Recent Labs Lab 11/08/14 1800 11/08/14 2000 11/09/14 0531 11/10/14 0400  NA 140  --  140 142  K 4.2  --  3.9 3.5*  CL 102  --  103 110  CO2 24  --  24 20  GLUCOSE 135*  --  104* 64*  BUN 20  --  22 19  CREATININE 1.20* 1.41* 1.33* 1.12*  CALCIUM 8.8  --  8.5 7.4*  MG  --   --   --  1.9   Liver Function Tests:  Recent Labs Lab 11/08/14 1800  11/09/14 0531 11/10/14 0400  AST 32 28 25  ALT 29 25 20   ALKPHOS 127* 111 85  BILITOT 0.6 0.6 0.4  PROT 7.5 7.1 5.9*  ALBUMIN 3.1* 2.8* 2.3*    Recent Labs Lab 11/08/14 1800  LIPASE 14   No results for input(s): AMMONIA in the last 168 hours. CBC:  Recent Labs Lab 11/08/14 1800 11/08/14 2000 11/09/14 0531 11/10/14 0400 11/10/14 0542  WBC 7.9 7.4 5.4 3.9* 3.9*  NEUTROABS 5.2  --  2.9 2.1  --   HGB 10.4* 9.8* 9.4* 8.5* 9.0*  HCT 31.8* 30.1* 29.4* 26.6* 28.2*  MCV 66.5* 65.0* 66.8* 67.2* 67.0*  PLT 173 159 145* 139* 152   Cardiac Enzymes:  Recent Labs Lab 11/08/14 2000 11/09/14 0141 11/09/14 0805  TROPONINI <0.30 <0.30 <0.30   BNP: BNP (last 3 results)  Recent Labs  03/08/14 0759 11/08/14 1800  PROBNP 2706.0* 2681.0*   CBG:  Recent Labs Lab 11/09/14 2332 11/10/14 0141 11/10/14 0342 11/10/14 0724 11/10/14 1130  GLUCAP 71 74 76 70 79       Signed:  VANN, JESSICA  [facility] 11/10/2014, 2:16 PM

## 2014-11-10 NOTE — Progress Notes (Signed)
Physician notified: Andrea [name] At: 1025  Regarding: Attempted to titrate O2 to RA, Sat down to 86% RA after 2 hours. Placed on 2 liters-98%. Can I get orders to check amb O2 for [facility] before DC today? Thanks  Orders placed in CPOE.   1200N: Attempted pt on room air again. No drop in saturation. OK to DC home with home health for milrinone drip.

## 2014-11-10 NOTE — Progress Notes (Signed)
Pharmacist Heart Failure Core Measure Documentation  Assessment: Andrea [name] has an EF documented as 30-35% on 08/09/2014 by Echo.  Rationale: Heart failure patients with left ventricular systolic dysfunction (LVSD) and an EF < 40% should be prescribed an angiotensin converting enzyme inhibitor (ACEI) or angiotensin receptor blocker (ARB) at discharge unless a contraindication is documented in the medical record.  This patient is not currently on an ACEI or ARB for HF.  This note is being placed in the record in order to provide documentation that a contraindication to the use of these agents is present for this encounter.  ACE Inhibitor or Angiotensin Receptor Blocker is contraindicated (specify all that apply)  []   ACEI allergy AND ARB allergy []   Angioedema []   Moderate or severe aortic stenosis []   Hyperkalemia []   Hypotension []   Renal artery stenosis [x]   Worsening renal function, preexisting renal disease or dysfunction  Harland German, Pharm D 11/10/2014 11:57 AM

## 2014-11-10 NOTE — Progress Notes (Signed)
DC instructions given to patient, questions answered. Delayed DC due to patient transportation at 1600. SpO2 stable 92-96% on room air. HH set up home milrinone drip and HH visits.

## 2014-11-10 NOTE — Progress Notes (Signed)
CRITICAL VALUE ALERT  Critical value received:  Positive blood cultures. Aerobic bottle- gram positive cocci in clusters.  Date of notification:  11/09/2014  Time of notification:  2325  Critical value read back:Yes.    Nurse who received alert:  Noralyn Pick., RN  MD notified (1st page):  Schorr, NP  Time of first page:  2332  Patient on antibiotics, no new orders received.

## 2014-11-11 LAB — CULTURE, BLOOD (ROUTINE X 2)

## 2014-11-13 LAB — CULTURE, BLOOD (ROUTINE X 2)

## 2014-11-15 ENCOUNTER — Ambulatory Visit (HOSPITAL_COMMUNITY)
Admission: RE | Admit: 2014-11-15 | Discharge: 2014-11-15 | Disposition: A | Discharge status: Home / Self Care | Payer: BC Managed Care – PPO | Source: Ambulatory Visit | Attending: Cardiology | Admitting: Cardiology

## 2014-11-15 ENCOUNTER — Encounter: Payer: Self-pay | Admitting: Licensed Clinical Social Worker

## 2014-11-15 ENCOUNTER — Encounter (HOSPITAL_COMMUNITY): Payer: Self-pay

## 2014-11-15 VITALS — BP 126/72 | HR 105 | Wt 188.8 lb

## 2014-11-15 DIAGNOSIS — N183 Chronic kidney disease, stage 3 (moderate): Secondary | ICD-10-CM

## 2014-11-15 DIAGNOSIS — R531 Weakness: Secondary | ICD-10-CM | POA: Insufficient documentation

## 2014-11-15 DIAGNOSIS — N189 Chronic kidney disease, unspecified: Secondary | ICD-10-CM | POA: Diagnosis not present

## 2014-11-15 DIAGNOSIS — I2510 Atherosclerotic heart disease of native coronary artery without angina pectoris: Secondary | ICD-10-CM | POA: Diagnosis not present

## 2014-11-15 DIAGNOSIS — I5022 Chronic systolic (congestive) heart failure: Secondary | ICD-10-CM | POA: Insufficient documentation

## 2014-11-15 DIAGNOSIS — I471 Supraventricular tachycardia: Secondary | ICD-10-CM | POA: Insufficient documentation

## 2014-11-15 DIAGNOSIS — R42 Dizziness and giddiness: Secondary | ICD-10-CM

## 2014-11-15 DIAGNOSIS — R509 Fever, unspecified: Secondary | ICD-10-CM | POA: Diagnosis present

## 2014-11-15 DIAGNOSIS — R000 Tachycardia, unspecified: Secondary | ICD-10-CM

## 2014-11-15 NOTE — Progress Notes (Signed)
Patient ID: [name] Lares, female   DOB: 1951-01-01, 63 y.o.   MRN: [id number]  PCP: Dr. Elias Elseobert Reade  [facility]   HPI: [name] Rossini is a 63 y.o. female with a history of CAD DES to prox RCA and stent to dist RCA in 2012, chronic systolic heart failure, ICM s/p ICD placement, liver failure thougth to be from cardiomyopathy -[facility], CVA intolerant aspirin , CKD, GERD, and DM.  Admitted 03/08/14 with low output HF. Admitting creatinine was 1.3 but increased to 4.49.  She was placed on Milrinone and diuresed with IV lasix. Milrinone was weaned off but restarted due to mixed venous saturation 39%. At that point she was discharged on Milrinone at 0.125 mcg via PICC. Discharge creatinine was 2.0. Discharge weight was 176 pounds.  Milrinone subsequently titrated to 0.375 mcg/kg/min  R/LHC 04/12/14: RA = 10  RV = 60/4/8  PA = 60/30 (42)  PCWP = 25  Fick CO/CI = 3.6/2.1  Thermo CO/CI = 2.9/1.6  PVR = 5.8  SVR = 1,911  Ao sat = 91%  PA sats = 46%, 49% 1. Nonobstructive CAD with patent stent RCA  ECHO 03/11/14 EF 15% RV ok  ECHO 08/09/14 Bedside EF 30-35%  Follow up for Heart Failure:  Last week she was admitted for fever. Ultimately thought to have been a viral syndrome. Overall feeling much better. Denies SOB/Orthopnea. Weight at home 182-185 pounds.  Continues with milrinone 0.375 mcg via PICC. + dizziness intermittently.  Following a low salt diet and drinking less than 2L a a day.    Labs 03/29/14; Cr 1.3 k 4.1 co-ox 76%          04/20/14: K+ 5.3, creatinine 1.15          04/26/14: K 5.1 Cr 1.01          06/01/14:   K 4.0, creatinine 1.28          07/20/14: K 3.5 Cr 1.16          08/25/14 J 4,5 Creatinine 1.4          09/07/14 K 4.3 Creatinine 1.76 Magnesium 2.2  Coreg stopped and lasix was help for 2 days.         11/08/14: K 4.5, creatinine 1.02, Magnesium 1.9   SH: Lives with partner, [name] and 63 year old son. She does not drive. Quit smoking 25 years ago. She does  not drink alcohol  FH:   Mom Heart Failure          Father MI          Brother Heart Disease     ROS: All systems negative except as listed in HPI, PMH and Problem List.  Past Medical History  Diagnosis Date  . Chronic systolic heart failure   . Hypertension   . Stroke 02/2010    left brain CVA? recurrent TIA's treated with TPA  . Dyslipidemia   . Premature ventricular contractions (PVCs) (VPCs)   . GERD (gastroesophageal reflux disease)   . Coronary artery disease     a. DES-dRCA 11/2010. b. DES-pRCA 04/2011 (in an area free of disease on prior cath). c. s/p DES-mRCA 03/2013 for NSTEMI.  . Ischemic cardiomyopathy     a. Hx of medtronic ICD. b. EF 15-20% by cath 03/2013.  . Pulmonary hypertension   . Gout   . Obesity   . Thrombocytopenia   . Depression   . Hypotension     a. Admission  09/2012 for hypotn & ARF. b. Meds held 03/2013 due to hypotension.  . Acute renal insufficiency     a. 09/2012. b. Also noted post-cath 03/2013.  . NSTEMI (non-ST elevated myocardial infarction) 03/2013  . Anginal pain   . Asthma   . Microcytic anemia     a. Noted 03/2013 on labs, iron studies WNL.  Marland Kitchen Anxiety   . CHF (congestive heart failure)   . CKD (chronic kidney disease) stage 3, GFR 30-59 ml/min   . HTN (hypertension)   . Primary pulmonary HTN   . Automatic implantable cardiac defibrillator - Medtronic 08/24/2013  . Heart murmur   . Type II diabetes mellitus   . Migraine headache     "no pain; aura/visual problems only; at least 2-3X/month" (01/18/2014)  . Arthritis     "knees" (01/18/2014)  . DJD (degenerative joint disease)   . Bipolar disorder     Current Outpatient Prescriptions  Medication Sig Dispense Refill  . amiodarone (PACERONE) 200 MG tablet Take 1 tablet (200 mg total) by mouth daily. 90 tablet 3  . diclofenac sodium (VOLTAREN) 1 % GEL Apply 2 g topically 4 (four) times daily as needed (pain).     . DULoxetine (CYMBALTA) 60 MG capsule Take 60 mg by mouth daily.    .  furosemide (LASIX) 40 MG tablet Take 1 tablet (40 mg total) by mouth 3 (three) times a week. Mon-Wed-Fri (Patient taking differently: Take 40 mg by mouth every Monday, Wednesday, and Friday. ) 30 tablet 6  . gabapentin (NEURONTIN) 100 MG capsule Take 1 capsule (100 mg total) by mouth 2 (two) times daily. (Patient taking differently: Take 200 mg by mouth at bedtime. ) 60 capsule 0  . magnesium oxide (MAG-OX) 400 (241.3 MG) MG tablet Take 1 tablet (400 mg total) by mouth 3 (three) times daily. 90 tablet 6  . milrinone (PRIMACOR) 20 MG/100ML SOLN infusion Inject 30.225 mcg/min into the vein continuous. 100 mL   . nitroGLYCERIN (NITROSTAT) 0.4 MG SL tablet Place 0.4 mg under the tongue every 5 (five) minutes as needed for chest pain.    Marland Kitchen omeprazole (PRILOSEC) 20 MG capsule Take 20 mg by mouth daily.    . potassium chloride SA (K-DUR,KLOR-CON) 20 MEQ tablet Take 1 tablet (20 mEq total) by mouth as directed. When you take extra lasix 30 tablet 3   No current facility-administered medications for this encounter.    Filed Vitals:   11/15/14 1011  BP: 126/72  Pulse: 105  Weight: 188 lb 12.8 oz (85.639 kg)  SpO2: 91%   PHYSICAL EXAM: General:  NAD  HEENT: normal Neck: supple. JVP 6-7 . Carotids 2+ bilaterally; no bruits. No lymphadenopathy or thryomegaly appreciated. Cor: PMI normal.  Regular tachy. No rubs, or murmurs. Possible soft s3 Lungs: clear Abdomen: soft, nontender, nondistended. No hepatosplenomegaly. No bruits or masses. Good bowel sounds. Extremities: no cyanosis, clubbing, rash, edema, RUE 2 lumen PICC - no drainage Neuro: alert & orientedx3, cranial nerves grossly intact. Moves all 4 extremities w/o difficulty. Affect pleasant   ASSESSMENT & PLAN:   1. Fevers/weakness - likely viral illness 2. Chronic Systolic HF, ICM s/p ICD; EF 15% RV ok (03/11/14). 07/2014 ECHO EF ~ 30-35% on milrinone 0.375 mcg. Having difficulty with RUE PICC. Has needed cath flow recently may need to place  hickman.  -Much better this week.l  NYHA IIIb symptoms and volume status mildly elevated. Will continue lasix 40 mg M/W/F. Labs from [facility] every Monday.    - Not  interested in VAD. Would consider transplant but wants to just continue milrinone for now. (We discussed referral to [facility] for Tx eval but she wants to hold off  -Continue hydralazine to 50 bid unable to take tid or to tolerate IMDUR due to severe HAs in setting of known migraines.  - Not on ACE-I with h/o hyperkalemia - Reinforced the need and importance of daily weights, a low sodium diet, and fluid restriction (less than 2 L a day). Instructed to call the HF clinic if weight increases more than 3 lbs overnight or 5 lbs in a week. ] 3. CKD- renal function now normal. BMET today per [facility] weekly. Has follow up with Dr Darrick Penna.  4. CAD S/P PCI with DES to prox RCA and stent to distal RCA in 2012 . No evidence of ischemia.  5. History of liver failure and cirrhosis. Treated at [facility]---> thought to be from cardiomyopathy 6. SVT - continue amio 200 daily. Need to check TSH liver funciton. [facility] to check .    Follow up in 1 month.  CLEGG,AMY NP-C  10:18 AM

## 2014-11-15 NOTE — Patient Instructions (Signed)
Your physician recommends that you schedule a follow-up appointment in: 4 weeks  

## 2014-11-15 NOTE — Progress Notes (Signed)
CSW met with patient in the clinic. Patient came to clinic today alone as her partner Mliss Sax is working. Patient expressed interest in transportation options as she is home alone and bored at times. She states she has an appointment with [facility] but looking for additional alternatives. CSW provided information on senior wheels program and also encouraged patient to attend [employer] [facility] for activity during the day. Patient appeared open to the idea and will follow up herself. CSW offered support and will continue to be available if needed. Raquel Sarna, LCSW [phone]

## 2014-11-16 ENCOUNTER — Telehealth (HOSPITAL_COMMUNITY): Payer: Self-pay | Admitting: Vascular Surgery

## 2014-11-16 ENCOUNTER — Encounter: Payer: Self-pay | Admitting: Internal Medicine

## 2014-11-16 NOTE — Telephone Encounter (Signed)
Pt called her kidney doctor wants to start her on a new medication for her Thyroid.Marland Kitchen She wants to talk to SOMEONE before taking this medication.. Please advise

## 2014-11-23 ENCOUNTER — Telehealth (HOSPITAL_COMMUNITY): Payer: Self-pay | Admitting: Vascular Surgery

## 2014-11-23 NOTE — Telephone Encounter (Signed)
Nurse from [facility] called need verbal Recert orders .Marland Kitchen Pt has been having some dizziness and she was off balance  yesterday.Marland Kitchen

## 2014-11-23 NOTE — Telephone Encounter (Signed)
Spoke w/Pattie, gave verbal to re cert pt, she states BP was fine adn she checked orthostatics which were fine as well, she states pt's wt is stable she will continue to monitor and c/b as needed

## 2014-12-06 ENCOUNTER — Encounter: Payer: Self-pay | Admitting: Internal Medicine

## 2014-12-06 ENCOUNTER — Telehealth (HOSPITAL_COMMUNITY): Payer: Self-pay | Admitting: Cardiology

## 2014-12-06 NOTE — Telephone Encounter (Signed)
Pt c/o increased fatigue during HH visit, vitals stable Nurse having trouble getting blood return from PICC line, cath flow was done last week Unable to get labs via venipuncture    Beckie Salts- [facility] [phone]

## 2014-12-06 NOTE — Telephone Encounter (Addendum)
Spoke w/pt she states since she has been out of the hospital is very weak and has no energy, she states weight is down a few lbs and VS were stable when RN was there, will discuss w/MD and call her back

## 2014-12-08 NOTE — Telephone Encounter (Signed)
Per Dr Gala Romney check co-ox at next appt, pt aware

## 2014-12-09 ENCOUNTER — Encounter: Payer: Self-pay | Admitting: Internal Medicine

## 2014-12-09 ENCOUNTER — Encounter (HOSPITAL_COMMUNITY): Payer: Self-pay | Admitting: Cardiovascular Disease

## 2014-12-13 ENCOUNTER — Ambulatory Visit (HOSPITAL_COMMUNITY)
Admission: RE | Admit: 2014-12-13 | Discharge: 2014-12-13 | Disposition: A | Discharge status: Home / Self Care | Payer: BC Managed Care – PPO | Source: Ambulatory Visit | Attending: Internal Medicine | Admitting: Internal Medicine

## 2014-12-13 ENCOUNTER — Encounter (HOSPITAL_COMMUNITY): Payer: Self-pay

## 2014-12-13 ENCOUNTER — Telehealth (HOSPITAL_COMMUNITY): Payer: Self-pay | Admitting: Vascular Surgery

## 2014-12-13 VITALS — BP 146/82 | HR 102 | Resp 18 | Wt 184.4 lb

## 2014-12-13 DIAGNOSIS — I5022 Chronic systolic (congestive) heart failure: Secondary | ICD-10-CM | POA: Diagnosis not present

## 2014-12-13 DIAGNOSIS — Z79899 Other long term (current) drug therapy: Secondary | ICD-10-CM | POA: Insufficient documentation

## 2014-12-13 DIAGNOSIS — N183 Chronic kidney disease, stage 3 (moderate): Secondary | ICD-10-CM

## 2014-12-13 DIAGNOSIS — R531 Weakness: Secondary | ICD-10-CM

## 2014-12-13 DIAGNOSIS — I10 Essential (primary) hypertension: Secondary | ICD-10-CM

## 2014-12-13 DIAGNOSIS — Z452 Encounter for adjustment and management of vascular access device: Secondary | ICD-10-CM | POA: Insufficient documentation

## 2014-12-13 MED ORDER — HYDRALAZINE HCL 50 MG PO TABS: 75.0000 mg | ORAL_TABLET | Freq: Three times a day (TID) | ORAL | Status: DC

## 2014-12-13 NOTE — Patient Instructions (Addendum)
Come to radiology at [facility] at 9:30 am tomorrow for new IV line placement (called a Hickman).  Stop by our clinic afterwards for lab work.  INCREASE Hydralazine to 75mg  (1.5 tablets) three times daily.  Follow up 6 weeks.  Merry Christmas and Happy New Year!  Do the following things EVERYDAY: 1) Weigh yourself in the morning before breakfast. Write it down and keep it in a log. 2) Take your medicines as prescribed 3) Eat low salt foods-Limit salt (sodium) to 2000 mg per day.  4) Stay as active as you can everyday 5) Limit all fluids for the day to less than 2 liters

## 2014-12-13 NOTE — Telephone Encounter (Signed)
Nurse called .Marland Kitchen Pt is coming in today.. Nurse wants the pt PICC Line checked.. No blood return.Carlyon Prows it make have came out a little .Marland Kitchen Please advise

## 2014-12-13 NOTE — Progress Notes (Signed)
Patient ID: Andrea [name], female   DOB: 01-06-1951, 63 y.o.   MRN: [id number]  PCP: Dr. Elias Else  [facility])   HPI: Andrea [name] is a 63 y.o. female with a history of CAD DES to prox RCA and stent to dist RCA in 2012, chronic systolic heart failure, ICM s/p ICD placement, liver failure thougth to be from cardiomyopathy -seen at [facility], CVA intolerant aspirin , CKD, GERD, and DM.  Admitted 03/08/14 with low output HF. Admitting creatinine was 1.3 but increased to 4.49.  She was placed on Milrinone and diuresed with IV lasix. Milrinone was weaned off but restarted due to mixed venous saturation 39%. At that point she was discharged on Milrinone at 0.125 mcg via PICC. Discharge creatinine was 2.0. Discharge weight was 176 pounds.  Milrinone subsequently titrated to 0.375 mcg/kg/min  R/LHC 04/12/14: RA = 10  RV = 60/4/8  PA = 60/30 (42)  PCWP = 25  Fick CO/CI = 3.6/2.1  Thermo CO/CI = 2.9/1.6  PVR = 5.8  SVR = 1,911  Ao sat = 91%  PA sats = 46%, 49% 1. Nonobstructive CAD with patent stent RCA  ECHO 03/11/14 EF 15% RV ok  ECHO 08/09/14 Bedside EF 30-35%  Follow up for Heart Failure: Reports ever since coming home from the hospital in early November has been fatigued. Denies SOB, orthopnea, PND or CP. Weight at home 177-182 lbs. Continues on milrinone 0.375 mcg. Having dizziness intermittently that is not associated with position changes. Following a low salt diet and trying to drink less than 2L a day. Not at all active and reports not d/t SOB but fatigue.   Labs 03/29/14; Cr 1.3 k 4.1 co-ox 76%          04/20/14: K+ 5.3, creatinine 1.15          04/26/14: K 5.1 Cr 1.01          06/01/14:   K 4.0, creatinine 1.28          07/20/14: K 3.5 Cr 1.16          08/25/14 J 4,5 Creatinine 1.4          09/07/14 K 4.3 Creatinine 1.76 Magnesium 2.2  Coreg stopped and lasix was help for 2 days.         11/08/14: K 4.5, creatinine 1.02, Magnesium 1.9        11/15/14: K+ 4.6, creatinine 1.28,  Mag 1.8, AST 30, ALT 18, T4 8.1, TSH 1.8, T3 total 71.9   SH: Lives with partner, [name] and 39 year old son. She does not drive. Quit smoking 25 years ago. She does not drink alcohol  FH:   Mom Heart Failure          Father MI          Brother Heart Disease     ROS: All systems negative except as listed in HPI, PMH and Problem List.  Past Medical History  Diagnosis Date  . Chronic systolic heart failure   . Hypertension   . Stroke 02/2010    left brain CVA? recurrent TIA's treated with TPA  . Dyslipidemia   . Premature ventricular contractions (PVCs) (VPCs)   . GERD (gastroesophageal reflux disease)   . Coronary artery disease     a. DES-dRCA 11/2010. b. DES-pRCA 04/2011 (in an area free of disease on prior cath). c. s/p DES-mRCA 03/2013 for NSTEMI.  . Ischemic cardiomyopathy     a. Hx of medtronic  ICD. b. EF 15-20% by cath 03/2013.  . Pulmonary hypertension   . Gout   . Obesity   . Thrombocytopenia   . Depression   . Hypotension     a. Admission 09/2012 for hypotn & ARF. b. Meds held 03/2013 due to hypotension.  . Acute renal insufficiency     a. 09/2012. b. Also noted post-cath 03/2013.  . NSTEMI (non-ST elevated myocardial infarction) 03/2013  . Anginal pain   . Asthma   . Microcytic anemia     a. Noted 03/2013 on labs, iron studies WNL.  Marland Kitchen. Anxiety   . CHF (congestive heart failure)   . CKD (chronic kidney disease) stage 3, GFR 30-59 ml/min   . HTN (hypertension)   . Primary pulmonary HTN   . Automatic implantable cardiac defibrillator - Medtronic 08/24/2013  . Heart murmur   . Type II diabetes mellitus   . Migraine headache     "no pain; aura/visual problems only; at least 2-3X/month" (01/18/2014)  . Arthritis     "knees" (01/18/2014)  . DJD (degenerative joint disease)   . Bipolar disorder     Current Outpatient Prescriptions  Medication Sig Dispense Refill  . amiodarone (PACERONE) 200 MG tablet Take 1 tablet (200 mg total) by mouth daily. 90 tablet 3  .  diclofenac sodium (VOLTAREN) 1 % GEL Apply 2 g topically 4 (four) times daily as needed (pain).     . DULoxetine (CYMBALTA) 60 MG capsule Take 60 mg by mouth daily.    . furosemide (LASIX) 40 MG tablet Take 1 tablet (40 mg total) by mouth 3 (three) times a week. Mon-Wed-Fri (Patient taking differently: Take 40 mg by mouth every Monday, Wednesday, and Friday. ) 30 tablet 6  . gabapentin (NEURONTIN) 100 MG capsule Take 1 capsule (100 mg total) by mouth 2 (two) times daily. (Patient taking differently: Take 200 mg by mouth at bedtime. ) 60 capsule 0  . magnesium oxide (MAG-OX) 400 (241.3 MG) MG tablet Take 1 tablet (400 mg total) by mouth 3 (three) times daily. 90 tablet 6  . milrinone (PRIMACOR) 20 MG/100ML SOLN infusion Inject 30.225 mcg/min into the vein continuous. 100 mL   . nitroGLYCERIN (NITROSTAT) 0.4 MG SL tablet Place 0.4 mg under the tongue every 5 (five) minutes as needed for chest pain.    Marland Kitchen. omeprazole (PRILOSEC) 20 MG capsule Take 20 mg by mouth daily.    . potassium chloride SA (K-DUR,KLOR-CON) 20 MEQ tablet Take 1 tablet (20 mEq total) by mouth as directed. When you take extra lasix 30 tablet 3   No current facility-administered medications for this encounter.    Filed Vitals:   12/13/14 1056  BP: 146/82  Pulse: 102  Resp: 18  Weight: 184 lb 7 oz (83.66 kg)  SpO2: 99%   PHYSICAL EXAM: General:  NAD  HEENT: normal Neck: supple. JVP 6-7 . Carotids 2+ bilaterally; no bruits. No lymphadenopathy or thryomegaly appreciated. Cor: PMI normal.  Regular tachy. No rubs, or murmurs.  Lungs: clear Abdomen: soft, nontender, nondistended. No hepatosplenomegaly. No bruits or masses. Good bowel sounds. Extremities: no cyanosis, clubbing, rash, edema, RUE 2 lumen PICC - no drainage Neuro: alert & orientedx3, cranial nerves grossly intact. Moves all 4 extremities w/o difficulty. Affect pleasant   ASSESSMENT & PLAN:  1. Chronic Systolic HF, ICM s/p ICD; EF ~ 30-35% on milrinone 0.375 mcg  (07/2014) - Chronic NYHA III symptoms and volume status stable. Will continue lasix on M/W/F.  - Continued fatigue and reports  what is limiting her the most. I believe it is a combination of not only HF but depression and deconditioning. Recommended to try and be as active as possible and try to push through a little of her fatigue. Will try to check co-ox today, however PICC line has been difficult to draw back blood.  - Not interested in VAD.  - Will increase hydralazine to 75 mg TID. Unable to tolerate IMDUR due to severe HAs in setting of known migraines.  - Not on ACE-I with h/o hyperkalemia - Reinforced the need and importance of daily weights, a low sodium diet, and fluid restriction (less than 2 L a day). Instructed to call the HF clinic if weight increases more than 3 lbs overnight or 5 lbs in a week. ] 3. CKD stage III - Baseline creatinine running 1.0-1.3. Will continue to follow closely.  4. CAD S/P PCI with DES to prox RCA and stent to distal RCA in 2012. No s/s of ischemia.  5. History of liver failure and cirrhosis. Treated at [facility]---> thought to be from cardiomyopathy 6. SVT - continue amio 200 daily.     F/U 6 weeks  Ulla Potash B NP-C  11:04 AM  Addendum: We were not able to get blood back from PICC for coox so we will set her up for her PICC to be removed and replaced with a tunneled central line in R chest tomorrow through IR. After she completes procedure stop by clinic to check coox.

## 2014-12-14 ENCOUNTER — Ambulatory Visit (HOSPITAL_BASED_OUTPATIENT_CLINIC_OR_DEPARTMENT_OTHER)
Admission: RE | Admit: 2014-12-14 | Discharge: 2014-12-14 | Disposition: A | Discharge status: Home / Self Care | Payer: BC Managed Care – PPO | Source: Ambulatory Visit | Attending: Internal Medicine | Admitting: Internal Medicine

## 2014-12-14 ENCOUNTER — Other Ambulatory Visit (HOSPITAL_COMMUNITY): Payer: Self-pay | Admitting: Anesthesiology

## 2014-12-14 ENCOUNTER — Ambulatory Visit (HOSPITAL_COMMUNITY)
Admission: RE | Admit: 2014-12-14 | Discharge: 2014-12-14 | Disposition: A | Discharge status: Home / Self Care | Payer: BC Managed Care – PPO | Source: Ambulatory Visit | Attending: Anesthesiology | Admitting: Anesthesiology

## 2014-12-14 DIAGNOSIS — I509 Heart failure, unspecified: Secondary | ICD-10-CM | POA: Diagnosis not present

## 2014-12-14 DIAGNOSIS — I5022 Chronic systolic (congestive) heart failure: Secondary | ICD-10-CM | POA: Diagnosis not present

## 2014-12-14 DIAGNOSIS — I872 Venous insufficiency (chronic) (peripheral): Secondary | ICD-10-CM | POA: Insufficient documentation

## 2014-12-14 DIAGNOSIS — Z452 Encounter for adjustment and management of vascular access device: Secondary | ICD-10-CM

## 2014-12-14 LAB — CARBOXYHEMOGLOBIN
Carboxyhemoglobin: 1.2 % (ref 0.5–1.5)
Methemoglobin: 1.1 % (ref 0.0–1.5)
O2 SAT: 78.5 %
TOTAL HEMOGLOBIN: 10.9 g/dL — AB (ref 12.0–16.0)

## 2014-12-14 MED ORDER — LIDOCAINE HCL 1 % IJ SOLN
INTRAMUSCULAR | Status: AC
  Filled 2014-12-14: qty 20

## 2014-12-14 NOTE — Procedures (Signed)
Successful placement of tunneled PICC with tips terminating within the superior CA junction. The patient tolerated the procedure well without immediate post procedural complication. The catheter is ready for immediate use.

## 2014-12-15 ENCOUNTER — Encounter: Payer: Self-pay | Admitting: Internal Medicine

## 2014-12-28 ENCOUNTER — Telehealth (HOSPITAL_COMMUNITY): Payer: Self-pay | Admitting: Vascular Surgery

## 2014-12-28 ENCOUNTER — Telehealth (HOSPITAL_COMMUNITY): Payer: Self-pay

## 2014-12-28 NOTE — Telephone Encounter (Signed)
Cora [facility] RN with [facility] returned call.  Verbalized understanding that patient was instructed to take extra lasix tablet today and will call us with update in morning.  Also received VO to administer cathflow through PICC line.  Order faxed to [facility] pharmacy.  If insruance does not cover in home cathflow, Cora [facility] RN will call us and we will need to set up in short stay in hospital at that time.  Ave Filter

## 2014-12-28 NOTE — Telephone Encounter (Signed)
[facility] from [facility] ... Increase SOB AND Fatigue, Her Picc is not giving blood return.. Need order for cath flow, hr 101, weight has been going up a pound daily.. Please advise

## 2014-12-28 NOTE — Telephone Encounter (Signed)
Left detailed message for [facility] RN to have pateint take extra lasix tabelt today and VO for cathflow to PICC.  Asked to return our call to verify she received this message.  Ave Filter

## 2014-12-28 NOTE — Telephone Encounter (Signed)
Patient c/o SOB, weight gain of a couple pounds over past few days, no edema noted.  Patient sounded winded when talking on phone.  Labs faxed in from [facility] stable and reviewd by Ulla Potash NP-C.  Per Karie Mainland, patient instructed to take extra lasix tablet today (usually takes one on Monday, Wednesday, Fridays only).  Will call us back in morning to update on status.  Aware and agreeable to plan as stated above.  Ave Filter

## 2014-12-28 NOTE — Telephone Encounter (Signed)
Pt called she is experiencing increased SOB.Andrea [name] Please advise

## 2015-01-11 ENCOUNTER — Encounter: Payer: Self-pay | Admitting: Internal Medicine

## 2015-01-17 ENCOUNTER — Ambulatory Visit (INDEPENDENT_AMBULATORY_CARE_PROVIDER_SITE_OTHER): Payer: BLUE CROSS/BLUE SHIELD | Admitting: Internal Medicine

## 2015-01-17 ENCOUNTER — Encounter: Payer: Self-pay | Admitting: Internal Medicine

## 2015-01-17 VITALS — BP 110/60 | Ht 61.0 in | Wt 177.2 lb

## 2015-01-17 DIAGNOSIS — I259 Chronic ischemic heart disease, unspecified: Secondary | ICD-10-CM | POA: Diagnosis not present

## 2015-01-17 DIAGNOSIS — I429 Cardiomyopathy, unspecified: Secondary | ICD-10-CM

## 2015-01-17 DIAGNOSIS — Z95810 Presence of automatic (implantable) cardiac defibrillator: Secondary | ICD-10-CM | POA: Diagnosis not present

## 2015-01-17 DIAGNOSIS — I5022 Chronic systolic (congestive) heart failure: Secondary | ICD-10-CM

## 2015-01-17 LAB — MDC_IDC_ENUM_SESS_TYPE_INCLINIC
Battery Voltage: 2.66 V
Brady Statistic RV Percent Paced: 0 %
Date Time Interrogation Session: 20160118113827
HIGH POWER IMPEDANCE MEASURED VALUE: 35 Ohm
HIGH POWER IMPEDANCE MEASURED VALUE: 36 Ohm
HIGH POWER IMPEDANCE MEASURED VALUE: 36 Ohm
HIGH POWER IMPEDANCE MEASURED VALUE: 37 Ohm
HIGH POWER IMPEDANCE MEASURED VALUE: 37 Ohm
HIGH POWER IMPEDANCE MEASURED VALUE: 38 Ohm
HIGH POWER IMPEDANCE MEASURED VALUE: 38 Ohm
HIGH POWER IMPEDANCE MEASURED VALUE: 45 Ohm
HIGH POWER IMPEDANCE MEASURED VALUE: 47 Ohm
HIGH POWER IMPEDANCE MEASURED VALUE: 47 Ohm
HIGH POWER IMPEDANCE MEASURED VALUE: 48 Ohm
HIGH POWER IMPEDANCE MEASURED VALUE: 50 Ohm
HighPow Impedance: 35 Ohm
HighPow Impedance: 36 Ohm
HighPow Impedance: 37 Ohm
HighPow Impedance: 37 Ohm
HighPow Impedance: 37 Ohm
HighPow Impedance: 37 Ohm
HighPow Impedance: 38 Ohm
HighPow Impedance: 38 Ohm
HighPow Impedance: 39 Ohm
HighPow Impedance: 45 Ohm
HighPow Impedance: 46 Ohm
HighPow Impedance: 46 Ohm
HighPow Impedance: 46 Ohm
HighPow Impedance: 47 Ohm
HighPow Impedance: 47 Ohm
HighPow Impedance: 47 Ohm
HighPow Impedance: 48 Ohm
HighPow Impedance: 48 Ohm
HighPow Impedance: 51 Ohm
Lead Channel Impedance Value: 408 Ohm
Lead Channel Impedance Value: 408 Ohm
Lead Channel Impedance Value: 408 Ohm
Lead Channel Impedance Value: 408 Ohm
Lead Channel Impedance Value: 416 Ohm
Lead Channel Impedance Value: 424 Ohm
Lead Channel Impedance Value: 424 Ohm
Lead Channel Impedance Value: 440 Ohm
Lead Channel Sensing Intrinsic Amplitude: 10.4 mV
Lead Channel Sensing Intrinsic Amplitude: 10.5 mV
Lead Channel Sensing Intrinsic Amplitude: 10.5 mV
Lead Channel Sensing Intrinsic Amplitude: 11.1 mV
Lead Channel Sensing Intrinsic Amplitude: 12.4 mV
Lead Channel Sensing Intrinsic Amplitude: 8.8 mV
Lead Channel Sensing Intrinsic Amplitude: 9.3 mV
Lead Channel Sensing Intrinsic Amplitude: 9.3 mV
Lead Channel Sensing Intrinsic Amplitude: 9.9 mV
Lead Channel Sensing Intrinsic Amplitude: 9.9 mV
MDC IDC MSMT LEADCHNL RV IMPEDANCE VALUE: 392 Ohm
MDC IDC MSMT LEADCHNL RV IMPEDANCE VALUE: 400 Ohm
MDC IDC MSMT LEADCHNL RV IMPEDANCE VALUE: 408 Ohm
MDC IDC MSMT LEADCHNL RV IMPEDANCE VALUE: 408 Ohm
MDC IDC MSMT LEADCHNL RV IMPEDANCE VALUE: 424 Ohm
MDC IDC MSMT LEADCHNL RV IMPEDANCE VALUE: 424 Ohm
MDC IDC MSMT LEADCHNL RV IMPEDANCE VALUE: 424 Ohm
MDC IDC MSMT LEADCHNL RV PACING THRESHOLD AMPLITUDE: 2 V
MDC IDC MSMT LEADCHNL RV PACING THRESHOLD PULSEWIDTH: 0.1 ms
MDC IDC MSMT LEADCHNL RV SENSING INTR AMPL: 10.7 mV
MDC IDC MSMT LEADCHNL RV SENSING INTR AMPL: 10.8 mV
MDC IDC MSMT LEADCHNL RV SENSING INTR AMPL: 11.4 mV
MDC IDC MSMT LEADCHNL RV SENSING INTR AMPL: 8.8 mV
MDC IDC MSMT LEADCHNL RV SENSING INTR AMPL: 9.6 mV
MDC IDC SET LEADCHNL RV PACING AMPLITUDE: 2 V
MDC IDC SET LEADCHNL RV PACING PULSEWIDTH: 0.4 ms
MDC IDC SET LEADCHNL RV SENSING SENSITIVITY: 0.3 mV
Zone Setting Detection Interval: 240 ms
Zone Setting Detection Interval: 300 ms
Zone Setting Detection Interval: 330 ms

## 2015-01-17 MED ORDER — NITROGLYCERIN 0.4 MG SL SUBL: 0.4000 mg | SUBLINGUAL_TABLET | SUBLINGUAL | Status: AC | PRN

## 2015-01-17 NOTE — Progress Notes (Signed)
HPI Andrea [name] returns today for followup. She is a 64 yo woman with a h/o an ICM, chronic class 3 systolic heart failure, intolerant to multiple medications including beta blockers, ARB's and nitrates who is s/p ICD implant. In the interim she has been stable on home milrinone. She is not interested in LVAD therapy. She also has a h/o stroke and TIA's. She received ICD shocks in April when she was admitted with CHF. None since. Allergies  Allergen Reactions  . Aspirin Shortness Of Breath and Other (See Comments)    Asthma symptoms  . Brilinta [Ticagrelor] Shortness Of Breath and Other (See Comments)    Gout   . Percocet [Oxycodone-Acetaminophen] Nausea And Vomiting  . Darvon Nausea And Vomiting  . Diovan [Valsartan] Other (See Comments) and Nausea Only    unknown  . Imitrex [Sumatriptan Base] Other (See Comments)    Unknown reaction  . Licorice Flavor [Artificial Licorice Flavor]     Black licorice induces asthma   . Nsaids Nausea And Vomiting and Other (See Comments)    GI upset      Current Outpatient Prescriptions  Medication Sig Dispense Refill  . amiodarone (PACERONE) 200 MG tablet Take 1 tablet (200 mg total) by mouth daily. 90 tablet 3  . diclofenac sodium (VOLTAREN) 1 % GEL Apply 2 g topically 4 (four) times daily as needed (pain).     . DULoxetine (CYMBALTA) 60 MG capsule Take 60 mg by mouth daily.    . furosemide (LASIX) 40 MG tablet Take 1 tablet (40 mg total) by mouth 3 (three) times a week. Mon-Wed-Fri (Patient taking differently: Take 40 mg by mouth every Monday, Wednesday, and Friday. ) 30 tablet 6  . gabapentin (NEURONTIN) 100 MG capsule Take 1 capsule (100 mg total) by mouth 2 (two) times daily. (Patient taking differently: Take 200 mg by mouth at bedtime. ) 60 capsule 0  . hydrALAZINE (APRESOLINE) 50 MG tablet Take 1.5 tablets (75 mg total) by mouth 3 (three) times daily. 45 tablet 3  . magnesium oxide (MAG-OX) 400 (241.3 MG) MG tablet Take 1 tablet  (400 mg total) by mouth 3 (three) times daily. 90 tablet 6  . milrinone (PRIMACOR) 20 MG/100ML SOLN infusion Inject 30.225 mcg/min into the vein continuous. 100 mL   . nitroGLYCERIN (NITROSTAT) 0.4 MG SL tablet Place 0.4 mg under the tongue every 5 (five) minutes as needed for chest pain.    Marland Kitchen omeprazole (PRILOSEC) 20 MG capsule Take 20 mg by mouth daily.    . potassium chloride SA (K-DUR,KLOR-CON) 20 MEQ tablet Take 1 tablet (20 mEq total) by mouth as directed. When you take extra lasix 30 tablet 3   No current facility-administered medications for this visit.     Past Medical History  Diagnosis Date  . Chronic systolic heart failure   . Hypertension   . Stroke 02/2010    left brain CVA? recurrent TIA's treated with TPA  . Dyslipidemia   . Premature ventricular contractions (PVCs) (VPCs)   . GERD (gastroesophageal reflux disease)   . Coronary artery disease     a. DES-dRCA 11/2010. b. DES-pRCA 04/2011 (in an area free of disease on prior cath). c. s/p DES-mRCA 03/2013 for NSTEMI.  . Ischemic cardiomyopathy     a. Hx of medtronic ICD. b. EF 15-20% by cath 03/2013.  . Pulmonary hypertension   . Gout   . Obesity   . Thrombocytopenia   . Depression   . Hypotension  a. Admission 09/2012 for hypotn & ARF. b. Meds held 03/2013 due to hypotension.  . Acute renal insufficiency     a. 09/2012. b. Also noted post-cath 03/2013.  . NSTEMI (non-ST elevated myocardial infarction) 03/2013  . Anginal pain   . Asthma   . Microcytic anemia     a. Noted 03/2013 on labs, iron studies WNL.  Marland Kitchen Anxiety   . CHF (congestive heart failure)   . CKD (chronic kidney disease) stage 3, GFR 30-59 ml/min   . HTN (hypertension)   . Primary pulmonary HTN   . Automatic implantable cardiac defibrillator - Medtronic 08/24/2013  . Heart murmur   . Type II diabetes mellitus   . Migraine headache     "no pain; aura/visual problems only; at least 2-3X/month" (01/18/2014)  . Arthritis     "knees" (01/18/2014)  . DJD  (degenerative joint disease)   . Bipolar disorder     ROS:   All systems reviewed and negative except as noted in the HPI.   Past Surgical History  Procedure Laterality Date  . Mass excision Left     hip  . Cervical polypectomy  1970's  . US echocardiography  2011  . Tonsillectomy  1960's?  . Right oophorectomy  1980's  . Dilation and curettage of uterus  1970's  . Ostectomy metatarsal Left 1993    "5th toe; from rickets" (05/07/2013)  . Implantable cardioverter defibrillator implant  ~ 2008  . Coronary angioplasty with stent placement  2000's    "2 stents" (05/07/2013)  . Coronary angioplasty with stent placement  03/2013    PCI to RCA/notes 05/05/2013; "makes total of 3" (05/07/2013)  . Left heart catheterization with coronary angiogram N/A 04/27/2013    Procedure: LEFT HEART CATHETERIZATION WITH CORONARY ANGIOGRAM;  Surgeon: Kathleene Hazel, MD;  Location[facility] CATH LAB;  Service: Cardiovascular;  Laterality: N/A;  . Percutaneous coronary stent intervention (pci-s)  04/27/2013    Procedure: PERCUTANEOUS CORONARY STENT INTERVENTION (PCI-S);  Surgeon: Kathleene Hazel, MD;  Location: [facility] CATH LAB;  Service: Cardiovascular;;  Mid RCA   . Left and right heart catheterization with coronary angiogram N/A 04/12/2014    Procedure: LEFT AND RIGHT HEART CATHETERIZATION WITH CORONARY ANGIOGRAM;  Surgeon: Dolores Patty, MD;  Location: [facility] CATH LAB;  Service: Cardiovascular;  Laterality: N/A;     Family History  Problem Relation Age of Onset  . Heart failure Mother   . Heart attack Father   . Heart disease Brother      History   Social History  . Marital Status: Single    Spouse Name: N/A    Number of Children: 1  . Years of Education: N/A   Occupational History  .     Social History Main Topics  . Smoking status: Former Smoker -- 0.05 packs/day for 5 years    Types: Cigarettes    Quit date: 04/23/1982  . Smokeless tobacco: Never Used  . Alcohol Use: No  . Drug  Use: No  . Sexual Activity: Yes   Other Topics Concern  . Not on file   Social History Narrative   ** Merged History Encounter **         There were no vitals taken for this visit.  Physical Exam:  stable appearing 65 yo woman, NAD HEENT: Unremarkable Neck:  No JVD, no thyromegally Back:  No CVA tenderness Lungs:  Clear with no wheezes. HEART:  Regular rate rhythm, no murmurs, no rubs, no clicks Abd:  soft, positive bowel sounds, no organomegally, no rebound, no guarding Ext:  2 plus pulses, no edema, no cyanosis, no clubbing Skin:  No rashes no nodules Neuro:  CN II through XII intact, motor grossly intact   DEVICE  Normal device function.  See PaceArt for details.   Assess/Plan:

## 2015-01-17 NOTE — Assessment & Plan Note (Signed)
She denies anginal symptoms. She will continue her current meds.  

## 2015-01-17 NOTE — Assessment & Plan Note (Signed)
Her medtronic ICD is working normally. Will recheck in several months. 

## 2015-01-17 NOTE — Patient Instructions (Signed)
Your physician recommends that you continue on your current medications as directed. Please refer to the Current Medication list given to you today.  Your physician wants you to follow-up in: 1 year with Dr. Taylor.  You will receive a reminder letter in the mail two months in advance. If you don't receive a letter, please call our office to schedule the follow-up appointment.  

## 2015-01-17 NOTE — Assessment & Plan Note (Signed)
Her heart failure symptoms are well compensated on home milrinone and medical therapy. No medical changes. She will followup with Dr. Dorthea Cove.

## 2015-01-24 ENCOUNTER — Encounter (HOSPITAL_COMMUNITY): Payer: BC Managed Care – PPO

## 2015-01-24 ENCOUNTER — Encounter: Payer: Self-pay | Admitting: Internal Medicine

## 2015-01-26 ENCOUNTER — Encounter: Payer: Self-pay | Admitting: Internal Medicine

## 2015-01-28 ENCOUNTER — Telehealth: Payer: Self-pay | Admitting: Internal Medicine

## 2015-01-28 NOTE — Telephone Encounter (Signed)
Pt  Calling re questions re setting up transmiter-does not have landline , pls advise [phone]

## 2015-01-28 NOTE — Telephone Encounter (Signed)
Walked pt through first transmission w/ wirex.

## 2015-02-02 ENCOUNTER — Telehealth (HOSPITAL_COMMUNITY): Payer: Self-pay

## 2015-02-02 NOTE — Telephone Encounter (Signed)
Received faxed labs, serum potassium 5.3, only taking potassium PRN when she takes Lasix and also eating bananas every day with cereal, snack, etc.  Advised per Amy Clegg NP-C to stop PRN potassium and refrain from eating foods high in K.  Aware and agreeable.  Ave Filter

## 2015-02-03 ENCOUNTER — Ambulatory Visit (HOSPITAL_COMMUNITY)
Admission: RE | Admit: 2015-02-03 | Discharge: 2015-02-03 | Disposition: A | Discharge status: Home / Self Care | Payer: BLUE CROSS/BLUE SHIELD | Source: Ambulatory Visit | Attending: Internal Medicine | Admitting: Internal Medicine

## 2015-02-03 VITALS — BP 124/76 | HR 116 | Wt 180.0 lb

## 2015-02-03 DIAGNOSIS — Z955 Presence of coronary angioplasty implant and graft: Secondary | ICD-10-CM | POA: Insufficient documentation

## 2015-02-03 DIAGNOSIS — K219 Gastro-esophageal reflux disease without esophagitis: Secondary | ICD-10-CM | POA: Insufficient documentation

## 2015-02-03 DIAGNOSIS — I129 Hypertensive chronic kidney disease with stage 1 through stage 4 chronic kidney disease, or unspecified chronic kidney disease: Secondary | ICD-10-CM | POA: Insufficient documentation

## 2015-02-03 DIAGNOSIS — F419 Anxiety disorder, unspecified: Secondary | ICD-10-CM | POA: Diagnosis not present

## 2015-02-03 DIAGNOSIS — I471 Supraventricular tachycardia: Secondary | ICD-10-CM | POA: Diagnosis not present

## 2015-02-03 DIAGNOSIS — Z87891 Personal history of nicotine dependence: Secondary | ICD-10-CM | POA: Insufficient documentation

## 2015-02-03 DIAGNOSIS — K7290 Hepatic failure, unspecified without coma: Secondary | ICD-10-CM | POA: Insufficient documentation

## 2015-02-03 DIAGNOSIS — N183 Chronic kidney disease, stage 3 (moderate): Secondary | ICD-10-CM | POA: Diagnosis not present

## 2015-02-03 DIAGNOSIS — I2583 Coronary atherosclerosis due to lipid rich plaque: Secondary | ICD-10-CM

## 2015-02-03 DIAGNOSIS — F319 Bipolar disorder, unspecified: Secondary | ICD-10-CM | POA: Insufficient documentation

## 2015-02-03 DIAGNOSIS — I2510 Atherosclerotic heart disease of native coronary artery without angina pectoris: Secondary | ICD-10-CM | POA: Diagnosis not present

## 2015-02-03 DIAGNOSIS — Z79899 Other long term (current) drug therapy: Secondary | ICD-10-CM | POA: Diagnosis not present

## 2015-02-03 DIAGNOSIS — I270 Primary pulmonary hypertension: Secondary | ICD-10-CM | POA: Insufficient documentation

## 2015-02-03 DIAGNOSIS — E118 Type 2 diabetes mellitus with unspecified complications: Secondary | ICD-10-CM | POA: Insufficient documentation

## 2015-02-03 DIAGNOSIS — M109 Gout, unspecified: Secondary | ICD-10-CM | POA: Insufficient documentation

## 2015-02-03 DIAGNOSIS — I5022 Chronic systolic (congestive) heart failure: Secondary | ICD-10-CM

## 2015-02-03 DIAGNOSIS — Z95810 Presence of automatic (implantable) cardiac defibrillator: Secondary | ICD-10-CM | POA: Diagnosis not present

## 2015-02-03 MED ORDER — DIGOXIN 125 MCG PO TABS: 0.1250 mg | ORAL_TABLET | Freq: Every day | ORAL | Status: DC

## 2015-02-03 NOTE — Patient Instructions (Signed)
START Digoxin 0.125mg  daily  Labs next week during home health visit.  Your physician recommends that you schedule a follow-up appointment in: 2 months  Do the following things EVERYDAY: 1) Weigh yourself in the morning before breakfast. Write it down and keep it in a log. 2) Take your medicines as prescribed 3) Eat low salt foods-Limit salt (sodium) to 2000 mg per day.  4) Stay as active as you can everyday 5) Limit all fluids for the day to less than 2 liters 6)

## 2015-02-03 NOTE — Progress Notes (Signed)
Patient ID: Andrea [name], female   DOB: [birth year], 64 y.o.   MRN: [id number] PCP: Dr. Elias Else  [facility])   HPI: Andrea [name] is a 64 y.o. female with a history of CAD DES to prox RCA and stent to dist RCA in 2012, chronic systolic heart failure, ICM s/p ICD placement, liver failure thougth to be from cardiomyopathy -seen at [facility], CVA intolerant aspirin , CKD, GERD, and DM.  Admitted 03/08/14 with low output HF. Admitting creatinine was 1.3 but increased to 4.49.  She was placed on Milrinone and diuresed with IV lasix. Milrinone was weaned off but restarted due to mixed venous saturation 39%. At that point she was discharged on Milrinone at 0.125 mcg via PICC. Discharge creatinine was 2.0. Discharge weight was 176 pounds.  Milrinone subsequently titrated to 0.375 mcg/kg/min  R/LHC 04/12/14: RA = 10  RV = 60/4/8  PA = 60/30 (42)  PCWP = 25  Fick CO/CI = 3.6/2.1  Thermo CO/CI = 2.9/1.6  PVR = 5.8  SVR = 1,911  Ao sat = 91%  PA sats = 46%, 49% 1. Nonobstructive CAD with patent stent RCA  ECHO 03/11/14 EF 15% RV ok  ECHO 08/09/14 Bedside EF 30-35%  Follow up for Heart Failure: Remains on Milrinone 0.375 mcg. Last visit hydralazine was increased to 75 mg tid. Complains of fatigue with ADLS. Dry cough at night. Weight at home 177 pounds. Taking lasix 40 mg twice a day three times a week. Taking all medications. No edema, CP, orthopnea, PND.   ICD interrogated personally in clinic. Volume status ok. No VT/AF. Activity < 1hour   Labs 03/29/14; Cr 1.3 k 4.1 co-ox 76%          04/20/14: K+ 5.3, creatinine 1.15          04/26/14: K 5.1 Cr 1.01          06/01/14:   K 4.0, creatinine 1.28          07/20/14: K 3.5 Cr 1.16          08/25/14 J 4,5 Creatinine 1.4          09/07/14 K 4.3 Creatinine 1.76 Magnesium 2.2  Coreg stopped and lasix was help for 2 days.         11/08/14: K 4.5, creatinine 1.02, Magnesium 1.9        11/15/14: K+ 4.6, creatinine 1.28, Mag 1.8, AST 30, ALT 18, T4  8.1, TSH 1.8, T3 total 71.9            01/31/2015 K 5.4 Creatinine 1.22   SH: Lives with partner, Andrea [name] and 83 year old son. She does not drive. Quit smoking 25 years ago. She does not drink alcohol  FH:   Mom Heart Failure          Father MI          Brother Heart Disease     ROS: All systems negative except as listed in HPI, PMH and Problem List.  Past Medical History  Diagnosis Date  . Chronic systolic heart failure   . Hypertension   . Stroke 02/2010    left brain CVA? recurrent TIA's treated with TPA  . Dyslipidemia   . Premature ventricular contractions (PVCs) (VPCs)   . GERD (gastroesophageal reflux disease)   . Coronary artery disease     a. DES-dRCA 11/2010. b. DES-pRCA 04/2011 (in an area free of disease on prior cath). c. s/p DES-mRCA 03/2013 for NSTEMI.  Marland Kitchen  Ischemic cardiomyopathy     a. Hx of medtronic ICD. b. EF 15-20% by cath 03/2013.  . Pulmonary hypertension   . Gout   . Obesity   . Thrombocytopenia   . Depression   . Hypotension     a. Admission 09/2012 for hypotn & ARF. b. Meds held 03/2013 due to hypotension.  . Acute renal insufficiency     a. 09/2012. b. Also noted post-cath 03/2013.  . NSTEMI (non-ST elevated myocardial infarction) 03/2013  . Anginal pain   . Asthma   . Microcytic anemia     a. Noted 03/2013 on labs, iron studies WNL.  Marland Kitchen Anxiety   . CHF (congestive heart failure)   . CKD (chronic kidney disease) stage 3, GFR 30-59 ml/min   . HTN (hypertension)   . Primary pulmonary HTN   . Automatic implantable cardiac defibrillator - Medtronic 08/24/2013  . Heart murmur   . Type II diabetes mellitus   . Migraine headache     "no pain; aura/visual problems only; at least 2-3X/month" (01/18/2014)  . Arthritis     "knees" (01/18/2014)  . DJD (degenerative joint disease)   . Bipolar disorder     Current Outpatient Prescriptions  Medication Sig Dispense Refill  . calcitRIOL (ROCALTROL) 0.25 MCG capsule Take 1 capsule by mouth daily.    . diclofenac  sodium (VOLTAREN) 1 % GEL Apply 2 g topically 4 (four) times daily as needed (pain).     . DULoxetine (CYMBALTA) 60 MG capsule Take 60 mg by mouth daily.    . furosemide (LASIX) 40 MG tablet Take 1 tablet (40 mg total) by mouth 3 (three) times a week. Mon-Wed-Fri (Patient taking differently: Take 40 mg by mouth every Monday, Wednesday, and Friday. ) 30 tablet 6  . gabapentin (NEURONTIN) 100 MG capsule Take 200 mg by mouth at bedtime.    . hydrALAZINE (APRESOLINE) 50 MG tablet Take 1.5 tablets (75 mg total) by mouth 3 (three) times daily. 45 tablet 3  . magnesium oxide (MAG-OX) 400 (241.3 MG) MG tablet Take 1 tablet (400 mg total) by mouth 3 (three) times daily. 90 tablet 6  . milrinone (PRIMACOR) 20 MG/100ML SOLN infusion Inject 30.225 mcg/min into the vein continuous. 100 mL   . nitroGLYCERIN (NITROSTAT) 0.4 MG SL tablet Place 1 tablet (0.4 mg total) under the tongue every 5 (five) minutes as needed for chest pain (MAX 3 TABLETS IN 15 MINUTES). 25 tablet 3  . pantoprazole (PROTONIX) 40 MG tablet Take 1 tablet by mouth daily.    . ranitidine (ZANTAC) 300 MG tablet Take 1 tablet by mouth daily.    Marland Kitchen rOPINIRole (REQUIP) 2 MG tablet Take 0.5 tablets by mouth at bedtime.    Marland Kitchen zolpidem (AMBIEN) 5 MG tablet Take 1 tablet by mouth at bedtime.     No current facility-administered medications for this encounter.    Filed Vitals:   02/03/15 1434  BP: 124/76  Pulse: 116  Weight: 180 lb (81.647 kg)  SpO2: 98%   PHYSICAL EXAM: General:  NAD  HEENT: normal Neck: supple. JVP 6-7 . Carotids 2+ bilaterally; no bruits. No lymphadenopathy or thryomegaly appreciated. Cor: PMI normal.  Regular tachy. No rubs, or murmurs.  Lungs: clear Abdomen: soft, nontender, nondistended. No hepatosplenomegaly. No bruits or masses. Good bowel sounds. Extremities: no cyanosis, clubbing, rash, edema, RUE 2 lumen PICC - no drainage Neuro: alert & orientedx3, cranial nerves grossly intact. Moves all 4 extremities w/o  difficulty. Affect pleasant   ASSESSMENT & PLAN:  1. Chronic Systolic HF, ICM s/p ICD; EF ~ 30-35% on milrinone 0.375 mcg (07/2014). Not interested in VAD.  - Chronic NYHA IIIB symptom on inotrope therapy. Volume status stable. Will continue lasix on M/W/F.  - Continue hydralazine to 75 mg TID. Unable to tolerate IMDUR due to severe HAs in setting of known migraines.  - Not on ACE-I with h/o hyperkalemia. No b-blocker due to low output. -Will add digoxin 0.125 daily check level next week. May benefit from ivabradine, though need to be careful with milrinone on board.  - Reinforced the need and importance of daily weights, a low sodium diet, and fluid restriction (less than 2 L a day). Instructed to call the HF clinic if weight increases more than 3 lbs overnight or 5 lbs in a week. ] 3. CKD stage III - Baseline creatinine running 1.0-1.3. Will continue to follow closely. Weekly BMET  4. CAD S/P PCI with DES to prox RCA and stent to distal RCA in 2012. No s/s of ischemia.  5. PSVT - quiescent. continue amio 200 daily. Check TFTs, LFTs. Needs yearly eye exams.      Followup in 2 months  CLEGG,AMY NP-C  2:54 PM   Patient seen and examined with Tonye Becket, NP. We discussed all aspects of the encounter. I agree with the assessment and plan as stated above.   Remains NYHA IIIB despite milrinone therapy. Volume status looks good. ICD interrogated personally. Will add digoxin. Can consider ivabradine in future.   Daniel Bensimhon,MD 3:26 PM

## 2015-02-03 NOTE — Addendum Note (Signed)
Encounter addended by: Theresia Bough, CMA on: 02/03/2015  3:32 PM<BR>     Documentation filed: Orders, Dx Association, Patient Instructions Section

## 2015-02-07 ENCOUNTER — Encounter: Payer: Self-pay | Admitting: Internal Medicine

## 2015-02-07 ENCOUNTER — Telehealth (HOSPITAL_COMMUNITY): Payer: Self-pay | Admitting: Vascular Surgery

## 2015-02-07 NOTE — Telephone Encounter (Signed)
Nurse from [facility] , pt was put on Digoxin pt told nurse that she would need additional labs, please advise

## 2015-02-07 NOTE — Telephone Encounter (Signed)
Pattie aware pt needs dig level, she will draw

## 2015-02-18 ENCOUNTER — Telehealth (HOSPITAL_COMMUNITY): Payer: Self-pay | Admitting: Vascular Surgery

## 2015-02-18 NOTE — Telephone Encounter (Signed)
Attempted to call back, no answer.

## 2015-02-18 NOTE — Telephone Encounter (Signed)
Pt called she is experiencing increased SOB just doing her daily activities at home .Marland Kitchen Please advise

## 2015-02-22 ENCOUNTER — Encounter: Payer: Self-pay | Admitting: Internal Medicine

## 2015-02-24 ENCOUNTER — Encounter: Payer: Self-pay | Admitting: Internal Medicine

## 2015-03-14 ENCOUNTER — Telehealth (HOSPITAL_COMMUNITY): Payer: Self-pay | Admitting: Vascular Surgery

## 2015-03-14 NOTE — Telephone Encounter (Signed)
Patient states she is having lots of nausea and vomiting.  Believes to be a stomach bug.  Advised to hold lasix until resolves, and to try to take digoxin before any other medications to make sure she at least gets this in.  Also encouraged to see PCP or urgent care to be seen concerning this issue.  Aware and agreeable.  Ave Filter

## 2015-03-14 NOTE — Telephone Encounter (Signed)
Pt has been throwing up since yesterday she wants to know does she still need to take her medications.. Please advise

## 2015-03-15 ENCOUNTER — Telehealth (HOSPITAL_COMMUNITY): Payer: Self-pay | Admitting: Vascular Surgery

## 2015-03-15 ENCOUNTER — Encounter (HOSPITAL_COMMUNITY): Payer: Self-pay | Admitting: *Deleted

## 2015-03-15 ENCOUNTER — Emergency Department (HOSPITAL_COMMUNITY)
Admission: EM | Admit: 2015-03-15 | Discharge: 2015-03-16 | Disposition: A | Discharge status: Home / Self Care | Payer: BLUE CROSS/BLUE SHIELD | Attending: Emergency Medicine | Admitting: Emergency Medicine

## 2015-03-15 DIAGNOSIS — I252 Old myocardial infarction: Secondary | ICD-10-CM | POA: Insufficient documentation

## 2015-03-15 DIAGNOSIS — Z9861 Coronary angioplasty status: Secondary | ICD-10-CM | POA: Diagnosis not present

## 2015-03-15 DIAGNOSIS — F319 Bipolar disorder, unspecified: Secondary | ICD-10-CM | POA: Insufficient documentation

## 2015-03-15 DIAGNOSIS — Z8673 Personal history of transient ischemic attack (TIA), and cerebral infarction without residual deficits: Secondary | ICD-10-CM | POA: Insufficient documentation

## 2015-03-15 DIAGNOSIS — E119 Type 2 diabetes mellitus without complications: Secondary | ICD-10-CM | POA: Diagnosis not present

## 2015-03-15 DIAGNOSIS — I2510 Atherosclerotic heart disease of native coronary artery without angina pectoris: Secondary | ICD-10-CM | POA: Insufficient documentation

## 2015-03-15 DIAGNOSIS — Z9889 Other specified postprocedural states: Secondary | ICD-10-CM | POA: Insufficient documentation

## 2015-03-15 DIAGNOSIS — N183 Chronic kidney disease, stage 3 (moderate): Secondary | ICD-10-CM | POA: Diagnosis not present

## 2015-03-15 DIAGNOSIS — R112 Nausea with vomiting, unspecified: Secondary | ICD-10-CM | POA: Diagnosis not present

## 2015-03-15 DIAGNOSIS — G43909 Migraine, unspecified, not intractable, without status migrainosus: Secondary | ICD-10-CM | POA: Diagnosis not present

## 2015-03-15 DIAGNOSIS — E669 Obesity, unspecified: Secondary | ICD-10-CM | POA: Diagnosis not present

## 2015-03-15 DIAGNOSIS — R011 Cardiac murmur, unspecified: Secondary | ICD-10-CM | POA: Insufficient documentation

## 2015-03-15 DIAGNOSIS — R1084 Generalized abdominal pain: Secondary | ICD-10-CM | POA: Insufficient documentation

## 2015-03-15 DIAGNOSIS — I5022 Chronic systolic (congestive) heart failure: Secondary | ICD-10-CM | POA: Diagnosis not present

## 2015-03-15 DIAGNOSIS — J45909 Unspecified asthma, uncomplicated: Secondary | ICD-10-CM | POA: Diagnosis not present

## 2015-03-15 DIAGNOSIS — K219 Gastro-esophageal reflux disease without esophagitis: Secondary | ICD-10-CM | POA: Insufficient documentation

## 2015-03-15 DIAGNOSIS — R000 Tachycardia, unspecified: Secondary | ICD-10-CM | POA: Diagnosis not present

## 2015-03-15 DIAGNOSIS — Z95810 Presence of automatic (implantable) cardiac defibrillator: Secondary | ICD-10-CM | POA: Diagnosis not present

## 2015-03-15 DIAGNOSIS — I129 Hypertensive chronic kidney disease with stage 1 through stage 4 chronic kidney disease, or unspecified chronic kidney disease: Secondary | ICD-10-CM | POA: Insufficient documentation

## 2015-03-15 DIAGNOSIS — Z87891 Personal history of nicotine dependence: Secondary | ICD-10-CM | POA: Insufficient documentation

## 2015-03-15 DIAGNOSIS — M179 Osteoarthritis of knee, unspecified: Secondary | ICD-10-CM | POA: Insufficient documentation

## 2015-03-15 DIAGNOSIS — Z79899 Other long term (current) drug therapy: Secondary | ICD-10-CM | POA: Diagnosis not present

## 2015-03-15 DIAGNOSIS — F419 Anxiety disorder, unspecified: Secondary | ICD-10-CM | POA: Insufficient documentation

## 2015-03-15 DIAGNOSIS — R109 Unspecified abdominal pain: Secondary | ICD-10-CM

## 2015-03-15 LAB — CBC WITH DIFFERENTIAL/PLATELET
Basophils Absolute: 0.1 10*3/uL (ref 0.0–0.1)
Basophils Relative: 1 % (ref 0–1)
EOS PCT: 3 % (ref 0–5)
Eosinophils Absolute: 0.2 10*3/uL (ref 0.0–0.7)
HCT: 30.4 % — ABNORMAL LOW (ref 36.0–46.0)
Hemoglobin: 9.4 g/dL — ABNORMAL LOW (ref 12.0–15.0)
Lymphocytes Relative: 28 % (ref 12–46)
Lymphs Abs: 1.4 10*3/uL (ref 0.7–4.0)
MCH: 19.3 pg — ABNORMAL LOW (ref 26.0–34.0)
MCHC: 30.9 g/dL (ref 30.0–36.0)
MCV: 62.6 fL — AB (ref 78.0–100.0)
MONOS PCT: 8 % (ref 3–12)
Monocytes Absolute: 0.4 10*3/uL (ref 0.1–1.0)
NEUTROS PCT: 60 % (ref 43–77)
Neutro Abs: 2.9 10*3/uL (ref 1.7–7.7)
Platelets: 161 10*3/uL (ref 150–400)
RBC: 4.86 MIL/uL (ref 3.87–5.11)
RDW: 20.7 % — ABNORMAL HIGH (ref 11.5–15.5)
WBC: 5 10*3/uL (ref 4.0–10.5)

## 2015-03-15 LAB — BRAIN NATRIURETIC PEPTIDE: B Natriuretic Peptide: 701.4 pg/mL — ABNORMAL HIGH (ref 0.0–100.0)

## 2015-03-15 LAB — COMPREHENSIVE METABOLIC PANEL
ALBUMIN: 3.1 g/dL — AB (ref 3.5–5.2)
ALT: 20 U/L (ref 0–35)
AST: 42 U/L — AB (ref 0–37)
Alkaline Phosphatase: 85 U/L (ref 39–117)
Anion gap: 8 (ref 5–15)
BUN: 13 mg/dL (ref 6–23)
CALCIUM: 9 mg/dL (ref 8.4–10.5)
CHLORIDE: 109 mmol/L (ref 96–112)
CO2: 27 mmol/L (ref 19–32)
CREATININE: 1.14 mg/dL — AB (ref 0.50–1.10)
GFR calc Af Amer: 58 mL/min — ABNORMAL LOW (ref >90)
GFR calc non Af Amer: 50 mL/min — ABNORMAL LOW (ref >90)
Glucose, Bld: 153 mg/dL — ABNORMAL HIGH (ref 70–99)
Potassium: 4.2 mmol/L (ref 3.5–5.1)
Sodium: 144 mmol/L (ref 135–145)
Total Bilirubin: 0.8 mg/dL (ref 0.3–1.2)
Total Protein: 7.4 g/dL (ref 6.0–8.3)

## 2015-03-15 LAB — URINE MICROSCOPIC-ADD ON

## 2015-03-15 LAB — URINALYSIS, ROUTINE W REFLEX MICROSCOPIC
Glucose, UA: NEGATIVE mg/dL
Hgb urine dipstick: NEGATIVE
Ketones, ur: 15 mg/dL — AB
Leukocytes, UA: NEGATIVE
Nitrite: NEGATIVE
PH: 5.5 (ref 5.0–8.0)
Protein, ur: 100 mg/dL — AB
Specific Gravity, Urine: 1.025 (ref 1.005–1.030)
Urobilinogen, UA: 1 mg/dL (ref 0.0–1.0)

## 2015-03-15 LAB — LIPASE, BLOOD: Lipase: 20 U/L (ref 11–59)

## 2015-03-15 LAB — I-STAT TROPONIN, ED: TROPONIN I, POC: 0.02 ng/mL (ref 0.00–0.08)

## 2015-03-15 MED ORDER — ONDANSETRON HCL 4 MG/2ML IJ SOLN
4.0000 mg | INTRAMUSCULAR | Status: DC | PRN
  Administered 2015-03-15: 4 mg via INTRAVENOUS
  Filled 2015-03-15: qty 2

## 2015-03-15 MED ORDER — IOHEXOL 300 MG/ML  SOLN
25.0000 mL | Freq: Once | INTRAMUSCULAR | Status: AC | PRN
  Administered 2015-03-15: 25 mL via ORAL

## 2015-03-15 MED ORDER — MORPHINE SULFATE 4 MG/ML IJ SOLN
4.0000 mg | INTRAMUSCULAR | Status: DC | PRN
  Administered 2015-03-15: 4 mg via INTRAVENOUS
  Filled 2015-03-15: qty 1

## 2015-03-15 MED ORDER — SODIUM CHLORIDE 0.9 % IV SOLN
INTRAVENOUS | Status: DC
  Administered 2015-03-15: 23:00:00 via INTRAVENOUS

## 2015-03-15 NOTE — ED Notes (Signed)
Placed pt on nasal cannula 2L. O2 dropped into 80's. Nurse was notified.

## 2015-03-15 NOTE — ED Notes (Signed)
Pt from home for eval of generalized abd pain that started 2 nights ago, pt reports 4 episodes of vomiting whenever she tries to eat or drink. Pt also reports sob that started while laying down last night. Denies any cp at this time.

## 2015-03-15 NOTE — ED Provider Notes (Signed)
CSN: [id number]     Arrival date & time 03/15/15  1712 History   First MD Initiated Contact with Patient 03/15/15 2116     Chief Complaint  Patient presents with  . Abdominal Pain     HPI  Pt was seen at 2155. Per pt, c/o gradual onset and persistence of multiple intermittent episodes of N/V that began 2 days ago.   Describes the stools as "soft." Has been associated with generalized abd pain "that feels like someone punched me." Pt states she has felt "SOB" for the past 2 days, worse when laying down. Denies CP/palpitations, no cough, no back pain, no fevers, no black or blood in stools or emesis. Pt endorses others in household with similar symptoms recently.    Past Medical History  Diagnosis Date  . Chronic systolic heart failure   . Hypertension   . Stroke 02/2010    left brain CVA? recurrent TIA's treated with TPA  . Dyslipidemia   . Premature ventricular contractions (PVCs) (VPCs)   . GERD (gastroesophageal reflux disease)   . Coronary artery disease     a. DES-dRCA 11/2010. b. DES-pRCA 04/2011 (in an area free of disease on prior cath). c. s/p DES-mRCA 03/2013 for NSTEMI.  . Ischemic cardiomyopathy     a. Hx of medtronic ICD. b. EF 15-20% by cath 03/2013.  . Pulmonary hypertension   . Gout   . Obesity   . Thrombocytopenia   . Depression   . Hypotension     a. Admission 09/2012 for hypotn & ARF. b. Meds held 03/2013 due to hypotension.  . Acute renal insufficiency     a. 09/2012. b. Also noted post-cath 03/2013.  . NSTEMI (non-ST elevated myocardial infarction) 03/2013  . Anginal pain   . Asthma   . Microcytic anemia     a. Noted 03/2013 on labs, iron studies WNL.  Marland Kitchen Anxiety   . CHF (congestive heart failure)   . CKD (chronic kidney disease) stage 3, GFR 30-59 ml/min   . HTN (hypertension)   . Primary pulmonary HTN   . Automatic implantable cardiac defibrillator - Medtronic 08/24/2013  . Heart murmur   . Type II diabetes mellitus   . Migraine headache     "no pain;  aura/visual problems only; at least 2-3X/month" (01/18/2014)  . Arthritis     "knees" (01/18/2014)  . DJD (degenerative joint disease)   . Bipolar disorder    Past Surgical History  Procedure Laterality Date  . Mass excision Left     hip  . Cervical polypectomy  1970's  . US echocardiography  2011  . Tonsillectomy  1960's?  . Right oophorectomy  1980's  . Dilation and curettage of uterus  1970's  . Ostectomy metatarsal Left 1993    "5th toe; from rickets" (05/07/2013)  . Implantable cardioverter defibrillator implant  ~ 2008  . Coronary angioplasty with stent placement  2000's    "2 stents" (05/07/2013)  . Coronary angioplasty with stent placement  03/2013    PCI to RCA/notes 05/05/2013; "makes total of 3" (05/07/2013)  . Left heart catheterization with coronary angiogram N/A 04/27/2013    Procedure: LEFT HEART CATHETERIZATION WITH CORONARY ANGIOGRAM;  Surgeon: Kathleene Hazel, MD;  Location: [facility] CATH LAB;  Service: Cardiovascular;  Laterality: N/A;  . Percutaneous coronary stent intervention (pci-s)  04/27/2013    Procedure: PERCUTANEOUS CORONARY STENT INTERVENTION (PCI-S);  Surgeon: Kathleene Hazel, MD;  Location: [facility] CATH LAB;  Service: Cardiovascular;;  Mid RCA   .  Left and right heart catheterization with coronary angiogram N/A 04/12/2014    Procedure: LEFT AND RIGHT HEART CATHETERIZATION WITH CORONARY ANGIOGRAM;  Surgeon: Dolores Patty, MD;  Location: [facility] CATH LAB;  Service: Cardiovascular;  Laterality: N/A;   Family History  Problem Relation Age of Onset  . Heart failure Mother   . Heart attack Father   . Heart disease Brother    History  Substance Use Topics  . Smoking status: Former Smoker -- 0.05 packs/day for 5 years    Types: Cigarettes    Quit date: 04/23/1982  . Smokeless tobacco: Never Used  . Alcohol Use: No    Review of Systems ROS: Statement: All systems negative except as marked or noted in the HPI; Constitutional: Negative for fever and chills. ; ;  Eyes: Negative for eye pain, redness and discharge. ; ; ENMT: Negative for ear pain, hoarseness, nasal congestion, sinus pressure and sore throat. ; ; Cardiovascular: Negative for chest pain, palpitations, diaphoresis, and peripheral edema. ; ; Respiratory: +SOB. Negative for cough, wheezing and stridor. ; ; Gastrointestinal: +N/V, abd pain. Negative for diarrhea, blood in stool, hematemesis, jaundice and rectal bleeding. . ; ; Genitourinary: Negative for dysuria, flank pain and hematuria. ; ; Musculoskeletal: Negative for back pain and neck pain. Negative for swelling and trauma.; ; Skin: Negative for pruritus, rash, abrasions, blisters, bruising and skin lesion.; ; Neuro: Negative for headache, lightheadedness and neck stiffness. Negative for weakness, altered level of consciousness , altered mental status, extremity weakness, paresthesias, involuntary movement, seizure and syncope.      Allergies  Aspirin; Brilinta; Percocet; Darvon; Diovan; Imitrex; Licorice flavor; Nsaids; and Other  Home Medications   Prior to Admission medications   Medication Sig Start Date End Date Taking? Authorizing Provider  acetaminophen (TYLENOL) 500 MG tablet Take 500 mg by mouth every 6 (six) hours as needed for mild pain.   Yes Historical Provider, MD  calcitRIOL (ROCALTROL) 0.25 MCG capsule Take 1 capsule by mouth daily. 01/07/15  Yes Historical Provider, MD  diclofenac sodium (VOLTAREN) 1 % GEL Apply 2 g topically 4 (four) times daily as needed (pain).    Yes Historical Provider, MD  digoxin (LANOXIN) 0.125 MG tablet Take 1 tablet (0.125 mg total) by mouth daily. 02/03/15  Yes Dolores Patty, MD  DULoxetine (CYMBALTA) 60 MG capsule Take 60 mg by mouth daily.   Yes Historical Provider, MD  furosemide (LASIX) 40 MG tablet Take 1 tablet (40 mg total) by mouth 3 (three) times a week. Mon-Wed-Fri Patient taking differently: Take 40 mg by mouth every Monday, Wednesday, and Friday.  09/13/14  Yes Amy D Clegg, NP   gabapentin (NEURONTIN) 100 MG capsule Take 200 mg by mouth at bedtime.   Yes Historical Provider, MD  hydrALAZINE (APRESOLINE) 50 MG tablet Take 1.5 tablets (75 mg total) by mouth 3 (three) times daily. 12/13/14  Yes Aundria Rud, NP  magnesium oxide (MAG-OX) 400 (241.3 MG) MG tablet Take 1 tablet (400 mg total) by mouth 3 (three) times daily. 08/09/14  Yes Dolores Patty, MD  nitroGLYCERIN (NITROSTAT) 0.4 MG SL tablet Place 1 tablet (0.4 mg total) under the tongue every 5 (five) minutes as needed for chest pain (MAX 3 TABLETS IN 15 MINUTES). 01/17/15  Yes Marinus Maw, MD  pantoprazole (PROTONIX) 40 MG tablet Take 1 tablet by mouth daily. 01/07/15  Yes Historical Provider, MD  polyethylene glycol (MIRALAX / GLYCOLAX) packet Take 17 g by mouth daily as needed for mild constipation.  Yes Historical Provider, MD  ranitidine (ZANTAC) 300 MG tablet Take 1 tablet by mouth daily. 12/20/14  Yes Historical Provider, MD  rOPINIRole (REQUIP) 2 MG tablet Take 0.5 tablets by mouth at bedtime. 12/20/14  Yes Historical Provider, MD  zolpidem (AMBIEN) 5 MG tablet Take 1 tablet by mouth at bedtime. 12/20/14  Yes Historical Provider, MD  milrinone (PRIMACOR) 20 MG/100ML SOLN infusion Inject 30.225 mcg/min into the vein continuous. Patient not taking: Reported on 03/15/2015 04/12/14   Aundria Rud, NP   BP 153/90 mmHg  Pulse 108  Temp(Src) 98.2 F (36.8 C)  Resp 21  Ht 5' (1.524 m)  Wt 178 lb 8 oz (80.967 kg)  BMI 34.86 kg/m2  SpO2 97% Physical Exam  2200; Physical examination:  Nursing notes reviewed; Vital signs and O2 SAT reviewed;  Constitutional: Well developed, Well nourished, Well hydrated, In no acute distress; Head:  Normocephalic, atraumatic; Eyes: EOMI, PERRL, No scleral icterus; ENMT: Mouth and pharynx normal, Mucous membranes moist; Neck: Supple, Full range of motion, No lymphadenopathy; Cardiovascular: Tachycardic rate and rhythm, No gallop; Respiratory: Breath sounds clear & equal  bilaterally, No wheezes.  Speaking full sentences with ease, Normal respiratory effort/excursion; Chest: Nontender, Movement normal; Abdomen: Soft, +mild diffuse tenderness to palp. No rebound or guarding. Nondistended, Normal bowel sounds; Genitourinary: No CVA tenderness; Extremities: Pulses normal, No tenderness, No edema, No calf edema or asymmetry.; Neuro: AA&Ox3, Major CN grossly intact.  Speech clear. No gross focal motor or sensory deficits in extremities.; Skin: Color normal, Warm, Dry.   ED Course  Procedures      EKG Interpretation   Date/Time:  Tuesday March 15 2015 17:28:56 EDT Ventricular Rate:  108 PR Interval:  180 QRS Duration: 110 QT Interval:  312 QTC Calculation: 418 R Axis:   47 Text Interpretation:  Sinus tachycardia Nonspecific ST and T wave  abnormality Baseline wander Artifact When compared with ECG of 11/08/2014  No significant change was found Confirmed by [facility]  MD, Nicholos Johns  [phone]) on 03/15/2015 10:54:46 PM      MDM  MDM Reviewed: previous chart, nursing note and vitals Reviewed previous: labs and ECG Interpretation: labs, ECG, x-ray and CT scan     Results for orders placed or performed during the hospital encounter of 03/15/15  CBC with Differential  Result Value Ref Range   WBC 5.0 4.0 - 10.5 K/uL   RBC 4.86 3.87 - 5.11 MIL/uL   Hemoglobin 9.4 (L) 12.0 - 15.0 g/dL   HCT 60.4 (L) 54.0 - 98.1 %   MCV 62.6 (L) 78.0 - 100.0 fL   MCH 19.3 (L) 26.0 - 34.0 pg   MCHC 30.9 30.0 - 36.0 g/dL   RDW 19.1 (H) 47.8 - 29.5 %   Platelets 161 150 - 400 K/uL   Neutrophils Relative % 60 43 - 77 %   Lymphocytes Relative 28 12 - 46 %   Monocytes Relative 8 3 - 12 %   Eosinophils Relative 3 0 - 5 %   Basophils Relative 1 0 - 1 %   Neutro Abs 2.9 1.7 - 7.7 K/uL   Lymphs Abs 1.4 0.7 - 4.0 K/uL   Monocytes Absolute 0.4 0.1 - 1.0 K/uL   Eosinophils Absolute 0.2 0.0 - 0.7 K/uL   Basophils Absolute 0.1 0.0 - 0.1 K/uL   RBC Morphology POLYCHROMASIA PRESENT    Comprehensive metabolic panel  Result Value Ref Range   Sodium 144 135 - 145 mmol/L   Potassium 4.2 3.5 - 5.1 mmol/L  Chloride 109 96 - 112 mmol/L   CO2 27 19 - 32 mmol/L   Glucose, Bld 153 (H) 70 - 99 mg/dL   BUN 13 6 - 23 mg/dL   Creatinine, Ser 8.25 (H) 0.50 - 1.10 mg/dL   Calcium 9.0 8.4 - 05.3 mg/dL   Total Protein 7.4 6.0 - 8.3 g/dL   Albumin 3.1 (L) 3.5 - 5.2 g/dL   AST 42 (H) 0 - 37 U/L   ALT 20 0 - 35 U/L   Alkaline Phosphatase 85 39 - 117 U/L   Total Bilirubin 0.8 0.3 - 1.2 mg/dL   GFR calc non Af Amer 50 (L) >90 mL/min   GFR calc Af Amer 58 (L) >90 mL/min   Anion gap 8 5 - 15  Lipase, blood  Result Value Ref Range   Lipase 20 11 - 59 U/L  Urinalysis, Routine w reflex microscopic  Result Value Ref Range   Color, Urine AMBER (A) YELLOW   APPearance CLOUDY (A) CLEAR   Specific Gravity, Urine 1.025 1.005 - 1.030   pH 5.5 5.0 - 8.0   Glucose, UA NEGATIVE NEGATIVE mg/dL   Hgb urine dipstick NEGATIVE NEGATIVE   Bilirubin Urine SMALL (A) NEGATIVE   Ketones, ur 15 (A) NEGATIVE mg/dL   Protein, ur 976 (A) NEGATIVE mg/dL   Urobilinogen, UA 1.0 0.0 - 1.0 mg/dL   Nitrite NEGATIVE NEGATIVE   Leukocytes, UA NEGATIVE NEGATIVE  Brain natriuretic peptide  Result Value Ref Range   B Natriuretic Peptide 701.4 (H) 0.0 - 100.0 pg/mL  Urine microscopic-add on  Result Value Ref Range   WBC, UA 0-2 <3 WBC/hpf   RBC / HPF 0-2 <3 RBC/hpf   Bacteria, UA RARE RARE   Casts HYALINE CASTS (A) NEGATIVE  I-stat troponin, ED (only if pt is 64 y.o. or older & pain is above umbilicus) - do not order at [facility]  Result Value Ref Range   Troponin i, poc 0.02 0.00 - 0.08 ng/mL   Comment 3             0125:  CT A/P pending. Pt has not stooled while in the ED. Has tol PO without N/V. Sign out to Dr. Norlene Campbell.    Samuel Jester, DO 03/16/15 0128

## 2015-03-15 NOTE — Telephone Encounter (Signed)
Spoke w/pt, she states she is still very sick and vomiting, she states her abd seems to be swollen and she is SOB, unable to lay flat at night.  She states this has been going on since Sun, she has not weighed recently as she has felt so bad, she reports dizziness as well.  She will report to ER for further evaluation

## 2015-03-15 NOTE — Telephone Encounter (Signed)
Pt called she is having trouble breathing nothing she eats stays down... Please advise

## 2015-03-15 NOTE — ED Notes (Addendum)
Pt reports that she has had abdominal pain and vomiting for several day. Pt states that she is unable to keep food down. Pt also reports SOB that started last night.

## 2015-03-16 ENCOUNTER — Emergency Department (HOSPITAL_COMMUNITY): Payer: BLUE CROSS/BLUE SHIELD

## 2015-03-16 ENCOUNTER — Encounter (HOSPITAL_COMMUNITY): Payer: Self-pay

## 2015-03-16 LAB — DIGOXIN LEVEL: DIGOXIN LVL: 0.9 ng/mL (ref 0.8–2.0)

## 2015-03-16 MED ORDER — IOHEXOL 300 MG/ML  SOLN
100.0000 mL | Freq: Once | INTRAMUSCULAR | Status: AC | PRN
  Administered 2015-03-16: 100 mL via INTRAVENOUS

## 2015-03-16 MED ORDER — ONDANSETRON 8 MG PO TBDP: 8.0000 mg | ORAL_TABLET | Freq: Three times a day (TID) | ORAL | Status: DC | PRN

## 2015-03-16 NOTE — Discharge Instructions (Signed)
Abdominal Pain °Many things can cause abdominal pain. Usually, abdominal pain is not caused by a disease and will improve without treatment. It can often be observed and treated at home. Your health care provider will do a physical exam and possibly order blood tests and X-rays to help determine the seriousness of your pain. However, in many cases, more time must pass before a clear cause of the pain can be found. Before that point, your health care provider may not know if you need more testing or further treatment. °HOME CARE INSTRUCTIONS  °Monitor your abdominal pain for any changes. The following actions may help to alleviate any discomfort you are experiencing: °· Only take over-the-counter or prescription medicines as directed by your health care provider. °· Do not take laxatives unless directed to do so by your health care provider. °· Try a clear liquid diet (broth, tea, or water) as directed by your health care provider. Slowly move to a bland diet as tolerated. °SEEK MEDICAL CARE IF: °· You have unexplained abdominal pain. °· You have abdominal pain associated with nausea or diarrhea. °· You have pain when you urinate or have a bowel movement. °· You experience abdominal pain that wakes you in the night. °· You have abdominal pain that is worsened or improved by eating food. °· You have abdominal pain that is worsened with eating fatty foods. °· You have a fever. °SEEK IMMEDIATE MEDICAL CARE IF:  °· Your pain does not go away within 2 hours. °· You keep throwing up (vomiting). °· Your pain is felt only in portions of the abdomen, such as the right side or the left lower portion of the abdomen. °· You pass bloody or black tarry stools. °MAKE SURE YOU: °· Understand these instructions.   °· Will watch your condition.   °· Will get help right away if you are not doing well or get worse.   °Document Released: 09/26/2005 Document Revised: 12/22/2013 Document Reviewed: 08/26/2013 °[employer]® Patient Information  ©2015 [employer]. This information is not intended to replace advice given to you by your health care provider. Make sure you discuss any questions you have with your health care provider. ° °Nausea and Vomiting °Nausea is a sick feeling that often comes before throwing up (vomiting). Vomiting is a reflex where stomach contents come out of your mouth. Vomiting can cause severe loss of body fluids (dehydration). Children and elderly adults can become dehydrated quickly, especially if they also have diarrhea. Nausea and vomiting are symptoms of a condition or disease. It is important to find the cause of your symptoms. °CAUSES  °· Direct irritation of the stomach lining. This irritation can result from increased acid production (gastroesophageal reflux disease), infection, food poisoning, taking certain medicines (such as nonsteroidal anti-inflammatory drugs), alcohol use, or tobacco use. °· Signals from the brain. These signals could be caused by a headache, heat exposure, an inner ear disturbance, increased pressure in the brain from injury, infection, a tumor, or a concussion, pain, emotional stimulus, or metabolic problems. °· An obstruction in the gastrointestinal tract (bowel obstruction). °· Illnesses such as diabetes, hepatitis, gallbladder problems, appendicitis, kidney problems, cancer, sepsis, atypical symptoms of a heart attack, or eating disorders. °· Medical treatments such as chemotherapy and radiation. °· Receiving medicine that makes you sleep (general anesthetic) during surgery. °DIAGNOSIS °Your caregiver may ask for tests to be done if the problems do not improve after a few days. Tests may also be done if symptoms are severe or if the reason for the nausea   and vomiting is not clear. Tests may include: °· Urine tests. °· Blood tests. °· Stool tests. °· Cultures (to look for evidence of infection). °· X-rays or other imaging studies. °Test results can help your caregiver make decisions about  treatment or the need for additional tests. °TREATMENT °You need to stay well hydrated. Drink frequently but in small amounts. You may wish to drink water, sports drinks, clear broth, or eat frozen ice pops or gelatin dessert to help stay hydrated. When you eat, eating slowly may help prevent nausea. There are also some antinausea medicines that may help prevent nausea. °HOME CARE INSTRUCTIONS  °· Take all medicine as directed by your caregiver. °· If you do not have an appetite, do not force yourself to eat. However, you must continue to drink fluids. °· If you have an appetite, eat a normal diet unless your caregiver tells you differently. °¨ Eat a variety of complex carbohydrates (rice, wheat, potatoes, bread), lean meats, yogurt, fruits, and vegetables. °¨ Avoid high-fat foods because they are more difficult to digest. °· Drink enough water and fluids to keep your urine clear or pale yellow. °· If you are dehydrated, ask your caregiver for specific rehydration instructions. Signs of dehydration may include: °¨ Severe thirst. °¨ Dry lips and mouth. °¨ Dizziness. °¨ Dark urine. °¨ Decreasing urine frequency and amount. °¨ Confusion. °¨ Rapid breathing or pulse. °SEEK IMMEDIATE MEDICAL CARE IF:  °· You have blood or brown flecks (like coffee grounds) in your vomit. °· You have black or bloody stools. °· You have a severe headache or stiff neck. °· You are confused. °· You have severe abdominal pain. °· You have chest pain or trouble breathing. °· You do not urinate at least once every 8 hours. °· You develop cold or clammy skin. °· You continue to vomit for longer than 24 to 48 hours. °· You have a fever. °MAKE SURE YOU:  °· Understand these instructions. °· Will watch your condition. °· Will get help right away if you are not doing well or get worse. °Document Released: 12/17/2005 Document Revised: 03/10/2012 Document Reviewed: 05/16/2011 °[employer]® Patient Information ©2015 [employer]. This information is not  intended to replace advice given to you by your health care provider. Make sure you discuss any questions you have with your health care provider. ° °

## 2015-03-16 NOTE — ED Provider Notes (Signed)
Care assumed from Dr. Clarene Duke awaiting CT scan.  Patient with nausea, vomiting, abdominal pain.  Workup here specific findings.  Patient has tolerated contrast without nausea or vomiting.  If CT without acute findings, patient is stable for discharge home  Results for orders placed or performed during the hospital encounter of 03/15/15  CBC with Differential  Result Value Ref Range   WBC 5.0 4.0 - 10.5 K/uL   RBC 4.86 3.87 - 5.11 MIL/uL   Hemoglobin 9.4 (L) 12.0 - 15.0 g/dL   HCT 36.6 (L) 44.0 - 34.7 %   MCV 62.6 (L) 78.0 - 100.0 fL   MCH 19.3 (L) 26.0 - 34.0 pg   MCHC 30.9 30.0 - 36.0 g/dL   RDW 42.5 (H) 95.6 - 38.7 %   Platelets 161 150 - 400 K/uL   Neutrophils Relative % 60 43 - 77 %   Lymphocytes Relative 28 12 - 46 %   Monocytes Relative 8 3 - 12 %   Eosinophils Relative 3 0 - 5 %   Basophils Relative 1 0 - 1 %   Neutro Abs 2.9 1.7 - 7.7 K/uL   Lymphs Abs 1.4 0.7 - 4.0 K/uL   Monocytes Absolute 0.4 0.1 - 1.0 K/uL   Eosinophils Absolute 0.2 0.0 - 0.7 K/uL   Basophils Absolute 0.1 0.0 - 0.1 K/uL   RBC Morphology POLYCHROMASIA PRESENT   Comprehensive metabolic panel  Result Value Ref Range   Sodium 144 135 - 145 mmol/L   Potassium 4.2 3.5 - 5.1 mmol/L   Chloride 109 96 - 112 mmol/L   CO2 27 19 - 32 mmol/L   Glucose, Bld 153 (H) 70 - 99 mg/dL   BUN 13 6 - 23 mg/dL   Creatinine, Ser 5.64 (H) 0.50 - 1.10 mg/dL   Calcium 9.0 8.4 - 33.2 mg/dL   Total Protein 7.4 6.0 - 8.3 g/dL   Albumin 3.1 (L) 3.5 - 5.2 g/dL   AST 42 (H) 0 - 37 U/L   ALT 20 0 - 35 U/L   Alkaline Phosphatase 85 39 - 117 U/L   Total Bilirubin 0.8 0.3 - 1.2 mg/dL   GFR calc non Af Amer 50 (L) >90 mL/min   GFR calc Af Amer 58 (L) >90 mL/min   Anion gap 8 5 - 15  Lipase, blood  Result Value Ref Range   Lipase 20 11 - 59 U/L  Urinalysis, Routine w reflex microscopic  Result Value Ref Range   Color, Urine AMBER (A) YELLOW   APPearance CLOUDY (A) CLEAR   Specific Gravity, Urine 1.025 1.005 - 1.030   pH 5.5  5.0 - 8.0   Glucose, UA NEGATIVE NEGATIVE mg/dL   Hgb urine dipstick NEGATIVE NEGATIVE   Bilirubin Urine SMALL (A) NEGATIVE   Ketones, ur 15 (A) NEGATIVE mg/dL   Protein, ur 951 (A) NEGATIVE mg/dL   Urobilinogen, UA 1.0 0.0 - 1.0 mg/dL   Nitrite NEGATIVE NEGATIVE   Leukocytes, UA NEGATIVE NEGATIVE  Brain natriuretic peptide  Result Value Ref Range   B Natriuretic Peptide 701.4 (H) 0.0 - 100.0 pg/mL  Urine microscopic-add on  Result Value Ref Range   WBC, UA 0-2 <3 WBC/hpf   RBC / HPF 0-2 <3 RBC/hpf   Bacteria, UA RARE RARE   Casts HYALINE CASTS (A) NEGATIVE  Digoxin level  Result Value Ref Range   Digoxin Level 0.9 0.8 - 2.0 ng/mL  I-stat troponin, ED (only if pt is 64 y.o. or older & pain is  above umbilicus) - do not order at [facility]  Result Value Ref Range   Troponin i, poc 0.02 0.00 - 0.08 ng/mL   Comment 3           Ct Abdomen Pelvis W Contrast  03/16/2015   CLINICAL DATA:  Acute onset of generalized abdominal pain and tenderness. Vomiting. Initial encounter.  EXAM: CT ABDOMEN AND PELVIS WITH CONTRAST  TECHNIQUE: Multidetector CT imaging of the abdomen and pelvis was performed using the standard protocol following bolus administration of intravenous contrast.  CONTRAST:  100 mL of Omnipaque 300 IV contrast  COMPARISON:  CT of the abdomen and pelvis from 11/08/2014  FINDINGS: A trace right pleural effusion is noted. Right basilar airspace opacity likely reflects atelectasis. Scattered coronary artery calcifications are seen. An AICD lead is partially imaged.  The liver and spleen are unremarkable in appearance. Trace fluid is suggested adjacent to the gallbladder. Stones are noted dependently within the gallbladder. The gallbladder is otherwise unremarkable, without significant gallbladder wall thickening. The pancreas and adrenal glands are unremarkable.  Nonspecific perinephric stranding is noted bilaterally. Bilateral fetal lobulations are noted. No renal or ureteral stones are seen.  There is no evidence of hydronephrosis.  No free fluid is identified. The small bowel is unremarkable in appearance. The stomach is within normal limits. No acute vascular abnormalities are seen.  The appendix is normal in caliber, without evidence for appendicitis. The colon is unremarkable in appearance.  The bladder is mildly distended and grossly unremarkable. Multiple fibroids are seen within the uterus, with mild associated calcification. The ovaries are relatively symmetric. No suspicious adnexal masses are seen. No inguinal lymphadenopathy is seen.  No acute osseous abnormalities are identified. There is chronic mild loss of height at vertebral bodies T11, T12 and L1, with mild associated degenerative change and vacuum phenomenon. Underlying facet disease is noted along the lower thoracic and lumbar spine.  IMPRESSION: 1. Trace fluid suggested adjacent to the gallbladder, new from the prior study. Stones dependently within the gallbladder are stable in appearance. Gallbladder otherwise unremarkable. 2. Trace right pleural effusion, with right basilar airspace opacity likely reflecting atelectasis. 3. Scattered coronary artery calcifications seen. 4. Uterine fibroids noted. 5. Chronic mild loss of height at T11, T12 and L1, with mild associated degenerative change.   Electronically Signed   By: Roanna Raider M.D.   On: 03/16/2015 01:37      Marisa Severin, MD 03/16/15 [phone]

## 2015-03-16 NOTE — ED Notes (Signed)
Pt back from CT

## 2015-03-17 ENCOUNTER — Encounter: Payer: Self-pay | Admitting: Internal Medicine

## 2015-03-21 ENCOUNTER — Encounter: Payer: Self-pay | Admitting: Internal Medicine

## 2015-03-21 ENCOUNTER — Telehealth (HOSPITAL_COMMUNITY): Payer: Self-pay | Admitting: Vascular Surgery

## 2015-03-21 NOTE — Telephone Encounter (Signed)
[facility] called need recert orders

## 2015-03-22 ENCOUNTER — Telehealth (HOSPITAL_COMMUNITY): Payer: Self-pay | Admitting: *Deleted

## 2015-03-22 ENCOUNTER — Encounter (HOSPITAL_COMMUNITY): Payer: Self-pay | Admitting: Emergency Medicine

## 2015-03-22 ENCOUNTER — Inpatient Hospital Stay (HOSPITAL_COMMUNITY)
Admission: EM | Admit: 2015-03-22 | Discharge: 2015-03-26 | DRG: 683 | Disposition: A | Discharge status: Home / Self Care | Payer: BLUE CROSS/BLUE SHIELD | Attending: Internal Medicine | Admitting: Internal Medicine

## 2015-03-22 ENCOUNTER — Emergency Department (HOSPITAL_COMMUNITY): Payer: BLUE CROSS/BLUE SHIELD

## 2015-03-22 DIAGNOSIS — K7460 Unspecified cirrhosis of liver: Secondary | ICD-10-CM | POA: Diagnosis present

## 2015-03-22 DIAGNOSIS — N183 Chronic kidney disease, stage 3 (moderate): Secondary | ICD-10-CM | POA: Diagnosis present

## 2015-03-22 DIAGNOSIS — I255 Ischemic cardiomyopathy: Secondary | ICD-10-CM | POA: Diagnosis present

## 2015-03-22 DIAGNOSIS — Z87891 Personal history of nicotine dependence: Secondary | ICD-10-CM | POA: Diagnosis not present

## 2015-03-22 DIAGNOSIS — Z955 Presence of coronary angioplasty implant and graft: Secondary | ICD-10-CM | POA: Diagnosis not present

## 2015-03-22 DIAGNOSIS — J45909 Unspecified asthma, uncomplicated: Secondary | ICD-10-CM | POA: Diagnosis present

## 2015-03-22 DIAGNOSIS — Z888 Allergy status to other drugs, medicaments and biological substances status: Secondary | ICD-10-CM

## 2015-03-22 DIAGNOSIS — D72819 Decreased white blood cell count, unspecified: Secondary | ICD-10-CM | POA: Diagnosis present

## 2015-03-22 DIAGNOSIS — E119 Type 2 diabetes mellitus without complications: Secondary | ICD-10-CM | POA: Diagnosis present

## 2015-03-22 DIAGNOSIS — I2510 Atherosclerotic heart disease of native coronary artery without angina pectoris: Secondary | ICD-10-CM | POA: Diagnosis present

## 2015-03-22 DIAGNOSIS — Z886 Allergy status to analgesic agent status: Secondary | ICD-10-CM

## 2015-03-22 DIAGNOSIS — E669 Obesity, unspecified: Secondary | ICD-10-CM | POA: Diagnosis present

## 2015-03-22 DIAGNOSIS — E785 Hyperlipidemia, unspecified: Secondary | ICD-10-CM | POA: Diagnosis present

## 2015-03-22 DIAGNOSIS — D509 Iron deficiency anemia, unspecified: Secondary | ICD-10-CM | POA: Diagnosis present

## 2015-03-22 DIAGNOSIS — I129 Hypertensive chronic kidney disease with stage 1 through stage 4 chronic kidney disease, or unspecified chronic kidney disease: Secondary | ICD-10-CM | POA: Diagnosis present

## 2015-03-22 DIAGNOSIS — K219 Gastro-esophageal reflux disease without esophagitis: Secondary | ICD-10-CM | POA: Diagnosis present

## 2015-03-22 DIAGNOSIS — D696 Thrombocytopenia, unspecified: Secondary | ICD-10-CM | POA: Diagnosis present

## 2015-03-22 DIAGNOSIS — N189 Chronic kidney disease, unspecified: Secondary | ICD-10-CM | POA: Diagnosis present

## 2015-03-22 DIAGNOSIS — Z95810 Presence of automatic (implantable) cardiac defibrillator: Secondary | ICD-10-CM

## 2015-03-22 DIAGNOSIS — N170 Acute kidney failure with tubular necrosis: Secondary | ICD-10-CM | POA: Diagnosis present

## 2015-03-22 DIAGNOSIS — N179 Acute kidney failure, unspecified: Secondary | ICD-10-CM | POA: Diagnosis present

## 2015-03-22 DIAGNOSIS — I5022 Chronic systolic (congestive) heart failure: Secondary | ICD-10-CM | POA: Diagnosis present

## 2015-03-22 DIAGNOSIS — R0989 Other specified symptoms and signs involving the circulatory and respiratory systems: Secondary | ICD-10-CM

## 2015-03-22 DIAGNOSIS — F419 Anxiety disorder, unspecified: Secondary | ICD-10-CM | POA: Diagnosis present

## 2015-03-22 DIAGNOSIS — G43909 Migraine, unspecified, not intractable, without status migrainosus: Secondary | ICD-10-CM | POA: Diagnosis present

## 2015-03-22 DIAGNOSIS — F319 Bipolar disorder, unspecified: Secondary | ICD-10-CM | POA: Diagnosis present

## 2015-03-22 DIAGNOSIS — I252 Old myocardial infarction: Secondary | ICD-10-CM | POA: Diagnosis not present

## 2015-03-22 DIAGNOSIS — M1990 Unspecified osteoarthritis, unspecified site: Secondary | ICD-10-CM | POA: Diagnosis present

## 2015-03-22 DIAGNOSIS — I10 Essential (primary) hypertension: Secondary | ICD-10-CM

## 2015-03-22 DIAGNOSIS — E860 Dehydration: Secondary | ICD-10-CM | POA: Diagnosis present

## 2015-03-22 DIAGNOSIS — E875 Hyperkalemia: Secondary | ICD-10-CM | POA: Diagnosis present

## 2015-03-22 DIAGNOSIS — Z8673 Personal history of transient ischemic attack (TIA), and cerebral infarction without residual deficits: Secondary | ICD-10-CM

## 2015-03-22 DIAGNOSIS — R42 Dizziness and giddiness: Secondary | ICD-10-CM | POA: Diagnosis not present

## 2015-03-22 LAB — COMPREHENSIVE METABOLIC PANEL
ALBUMIN: 3 g/dL — AB (ref 3.5–5.2)
ALT: 30 U/L (ref 0–35)
ANION GAP: 10 (ref 5–15)
AST: 97 U/L — ABNORMAL HIGH (ref 0–37)
Alkaline Phosphatase: 88 U/L (ref 39–117)
BILIRUBIN TOTAL: 1.6 mg/dL — AB (ref 0.3–1.2)
BUN: 63 mg/dL — AB (ref 6–23)
CHLORIDE: 107 mmol/L (ref 96–112)
CO2: 19 mmol/L (ref 19–32)
CREATININE: 4.7 mg/dL — AB (ref 0.50–1.10)
Calcium: 8.8 mg/dL (ref 8.4–10.5)
GFR calc non Af Amer: 9 mL/min — ABNORMAL LOW (ref >90)
GFR, EST AFRICAN AMERICAN: 10 mL/min — AB (ref >90)
Glucose, Bld: 79 mg/dL (ref 70–99)
Potassium: 6.7 mmol/L (ref 3.5–5.1)
Sodium: 136 mmol/L (ref 135–145)
TOTAL PROTEIN: 7.4 g/dL (ref 6.0–8.3)

## 2015-03-22 LAB — CBC WITH DIFFERENTIAL/PLATELET
BASOS ABS: 0 10*3/uL (ref 0.0–0.1)
Basophils Relative: 1 % (ref 0–1)
EOS PCT: 3 % (ref 0–5)
Eosinophils Absolute: 0.1 10*3/uL (ref 0.0–0.7)
HEMATOCRIT: 29.3 % — AB (ref 36.0–46.0)
HEMOGLOBIN: 9.7 g/dL — AB (ref 12.0–15.0)
LYMPHS ABS: 0.6 10*3/uL — AB (ref 0.7–4.0)
Lymphocytes Relative: 16 % (ref 12–46)
MCH: 20 pg — ABNORMAL LOW (ref 26.0–34.0)
MCHC: 33.1 g/dL (ref 30.0–36.0)
MCV: 60.4 fL — AB (ref 78.0–100.0)
MONOS PCT: 16 % — AB (ref 3–12)
Monocytes Absolute: 0.6 10*3/uL (ref 0.1–1.0)
Neutro Abs: 2.4 10*3/uL (ref 1.7–7.7)
Neutrophils Relative %: 63 % (ref 43–77)
Platelets: 235 10*3/uL (ref 150–400)
RBC: 4.85 MIL/uL (ref 3.87–5.11)
RDW: 21.4 % — ABNORMAL HIGH (ref 11.5–15.5)
WBC: 3.8 10*3/uL — AB (ref 4.0–10.5)

## 2015-03-22 LAB — URINALYSIS, ROUTINE W REFLEX MICROSCOPIC
Bilirubin Urine: NEGATIVE
Glucose, UA: NEGATIVE mg/dL
HGB URINE DIPSTICK: NEGATIVE
KETONES UR: NEGATIVE mg/dL
Leukocytes, UA: NEGATIVE
Nitrite: NEGATIVE
PROTEIN: NEGATIVE mg/dL
Specific Gravity, Urine: 1.014 (ref 1.005–1.030)
UROBILINOGEN UA: 0.2 mg/dL (ref 0.0–1.0)
pH: 5 (ref 5.0–8.0)

## 2015-03-22 LAB — DIGOXIN LEVEL: Digoxin Level: 1.9 ng/mL (ref 0.8–2.0)

## 2015-03-22 LAB — GLUCOSE, CAPILLARY: GLUCOSE-CAPILLARY: 96 mg/dL (ref 70–99)

## 2015-03-22 LAB — BRAIN NATRIURETIC PEPTIDE: B NATRIURETIC PEPTIDE 5: 930.9 pg/mL — AB (ref 0.0–100.0)

## 2015-03-22 LAB — TROPONIN I: TROPONIN I: 0.12 ng/mL — AB (ref <0.031)

## 2015-03-22 MED ORDER — SODIUM CHLORIDE 0.9 % IV BOLUS (SEPSIS): 250.0000 mL | Freq: Once | INTRAVENOUS | Status: DC

## 2015-03-22 MED ORDER — HYDROMORPHONE HCL 1 MG/ML IJ SOLN
0.5000 mg | INTRAMUSCULAR | Status: DC | PRN
Start: 2015-03-22 — End: 2015-03-23
  Administered 2015-03-22: 1 mg via INTRAVENOUS
  Filled 2015-03-22: qty 1

## 2015-03-22 MED ORDER — MILRINONE IN DEXTROSE 20 MG/100ML IV SOLN
0.3750 ug/kg/min | INTRAVENOUS | Status: DC
  Administered 2015-03-22 – 2015-03-25 (×7): 0.375 ug/kg/min via INTRAVENOUS
  Filled 2015-03-22 (×8): qty 100

## 2015-03-22 MED ORDER — ONDANSETRON HCL 4 MG/2ML IJ SOLN: 4.0000 mg | Freq: Four times a day (QID) | INTRAMUSCULAR | Status: DC | PRN

## 2015-03-22 MED ORDER — DULOXETINE HCL 60 MG PO CPEP
60.0000 mg | ORAL_CAPSULE | Freq: Every day | ORAL | Status: DC
  Administered 2015-03-23 – 2015-03-26 (×4): 60 mg via ORAL
  Filled 2015-03-22 (×4): qty 1

## 2015-03-22 MED ORDER — HEPARIN SODIUM (PORCINE) 5000 UNIT/ML IJ SOLN
5000.0000 [IU] | Freq: Three times a day (TID) | INTRAMUSCULAR | Status: DC
  Administered 2015-03-22 – 2015-03-26 (×11): 5000 [IU] via SUBCUTANEOUS
  Filled 2015-03-22 (×15): qty 1

## 2015-03-22 MED ORDER — SODIUM CHLORIDE 0.9 % IJ SOLN
3.0000 mL | Freq: Two times a day (BID) | INTRAMUSCULAR | Status: DC
  Administered 2015-03-22 – 2015-03-23 (×2): 3 mL via INTRAVENOUS

## 2015-03-22 MED ORDER — GABAPENTIN 100 MG PO CAPS
200.0000 mg | ORAL_CAPSULE | Freq: Every day | ORAL | Status: DC
  Administered 2015-03-22 – 2015-03-25 (×4): 200 mg via ORAL
  Filled 2015-03-22 (×5): qty 2

## 2015-03-22 MED ORDER — NITROGLYCERIN 0.4 MG SL SUBL: 0.4000 mg | SUBLINGUAL_TABLET | SUBLINGUAL | Status: DC | PRN

## 2015-03-22 MED ORDER — SODIUM CHLORIDE 0.9 % IV SOLN
250.0000 mL | INTRAVENOUS | Status: DC | PRN
  Administered 2015-03-25: 250 mL via INTRAVENOUS

## 2015-03-22 MED ORDER — ONDANSETRON HCL 4 MG PO TABS: 4.0000 mg | ORAL_TABLET | Freq: Four times a day (QID) | ORAL | Status: DC | PRN

## 2015-03-22 MED ORDER — CALCITRIOL 0.25 MCG PO CAPS
0.2500 ug | ORAL_CAPSULE | Freq: Every day | ORAL | Status: DC
  Administered 2015-03-22 – 2015-03-26 (×5): 0.25 ug via ORAL
  Filled 2015-03-22 (×5): qty 1

## 2015-03-22 MED ORDER — SODIUM CHLORIDE 0.9 % IJ SOLN: 3.0000 mL | INTRAMUSCULAR | Status: DC | PRN

## 2015-03-22 MED ORDER — SODIUM POLYSTYRENE SULFONATE 15 GM/60ML PO SUSP
30.0000 g | Freq: Once | ORAL | Status: AC
  Administered 2015-03-22: 30 g via ORAL
  Filled 2015-03-22: qty 120

## 2015-03-22 MED ORDER — ZOLPIDEM TARTRATE 5 MG PO TABS
5.0000 mg | ORAL_TABLET | Freq: Every day | ORAL | Status: DC
  Administered 2015-03-22 – 2015-03-25 (×4): 5 mg via ORAL
  Filled 2015-03-22 (×4): qty 1

## 2015-03-22 MED ORDER — PANTOPRAZOLE SODIUM 40 MG PO TBEC
40.0000 mg | DELAYED_RELEASE_TABLET | Freq: Every day | ORAL | Status: DC
  Administered 2015-03-22 – 2015-03-26 (×5): 40 mg via ORAL
  Filled 2015-03-22 (×3): qty 1

## 2015-03-22 MED ORDER — SODIUM CHLORIDE 0.9 % IJ SOLN
3.0000 mL | Freq: Two times a day (BID) | INTRAMUSCULAR | Status: DC
  Administered 2015-03-24 – 2015-03-26 (×3): 3 mL via INTRAVENOUS

## 2015-03-22 MED ORDER — ROPINIROLE HCL 1 MG PO TABS
2.0000 mg | ORAL_TABLET | Freq: Every day | ORAL | Status: DC
  Administered 2015-03-22 – 2015-03-25 (×4): 2 mg via ORAL
  Filled 2015-03-22 (×5): qty 2

## 2015-03-22 MED ORDER — INSULIN ASPART 100 UNIT/ML ~~LOC~~ SOLN
0.0000 [IU] | Freq: Three times a day (TID) | SUBCUTANEOUS | Status: DC
  Administered 2015-03-23 (×2): 1 [IU] via SUBCUTANEOUS

## 2015-03-22 NOTE — ED Provider Notes (Signed)
Pt signed out at shift change by Donetta Potts, PA-C.  Pt is a 64yo female with hx of CHF and CKD, sent to ED by Dr. Prescott Gum office with reports of elevated BUN 72 and Cr 6.74.  Labs were performed yesterday, 03/21/15.  Pt was seen in ED last week on 3/15 for a stomach virus with associated n/v/d.  Vomiting and diarrhea ended about 2 days ago. Pt has generalized weakness but otherwise asymptomatic.  On 3/15, Cr was 1.14 and BUN was 13.  Plan is to get CXR, EKG, CBC, CMP, BNP, and Troponin.  If unremarkable, pt will likely be discharged home, will consult with cardiology as they have sent pt to ED.    Physical Exam  BP 133/72 mmHg  Pulse 86  Temp(Src) 97.4 F (36.3 C) (Oral)  Resp 17  SpO2 98%  Physical Exam  Nursing note and vitals reviewed. Constitutional: pt lying on right side in exam bed, appears mildly fatigued, otherwise, NAD Eyes: PERRL, EOM in tact Cardiovascular: RRR Pulmonary/Chest: no respiratory distress, Lungs: CTAB Abdominal: soft, non-distended, non-tender Musculoskeletal: FROM arms and legs, non-tender Neurological: alert to person, place and time, speech is clear Skin: warm and dry   ED Course  Procedures  MDM Pt is a 64yo female with extensive PMH including CHF and CKD, presenting to ED as advised by her cardiologist for further evaluation and abnormal labs, elevated BUN and Cr.    Labs: K+: 6.7, BUN: 63 and Cr: 4.7.  BNP-930, Troponin: 0.12 Discussed pt with Dr. Madilyn Hook who also examined pt. Digoxin level still pending. Will consult with cardiology but pt will likely be a medicine admission for dialysis.   Consulted with Dr. Eldridge Dace, cardiology, recommended pt discontinue digoxin, lasix, and magnesium oxide until further specified by cardiology. Advised he will notify the heart failure clinic to f/u with pt in the morning as pt will need to be admitted to medicine service.    Dr. Madilyn Hook consulted with nephrology, recommends starting pt on 30g Kayexalate.   Nephrology will come evaluate pt.  7:13 PM Consulted with Dr. Lovell Sheehan, [facility], agreed to admit pt to tele bed. Pt is stable at this time.        Junius Finner, PA-C 03/22/15 [id number]  Tilden Fossa, MD 03/23/15 [phone]

## 2015-03-22 NOTE — Progress Notes (Signed)
Patient transferred from ED via stretcher. Oriented patient to the floor and to call bell system. Called EMT to confirm telemetry box is on and working properly. VSS. Will continue to monitor patient to end of shift.

## 2015-03-22 NOTE — ED Notes (Signed)
Checked with lab - states is beginning to run digoxin level at this time.

## 2015-03-22 NOTE — ED Notes (Signed)
Pt unable to go to 6E, requires a Cardiac Tele room. Bed Control aware and Dr. Lovell Sheehan, Admitting at bedside and aware.

## 2015-03-22 NOTE — ED Notes (Signed)
Critical Potassium 6.7 from Lab.

## 2015-03-22 NOTE — ED Notes (Signed)
Patient coming from home with c/o of dizziness x couple days.  Patient was advised by Cardiology to come in for recheck of abnormal labs.

## 2015-03-22 NOTE — ED Provider Notes (Signed)
Patient with CHF referred by cardiology for abnormal labs. She had a recent illness with vomiting and diarrhea. Labs demonstrate acute renal failure with hyperkalemia. Treatment of hyperkalemia held pending digoxin level given EKG without any QRS widening. Patient appears euvolemic to volume overloaded on exam. Will consult with nephrology regarding hyperkalemia treatment options and will admit to medicine for further evaluation.  Tilden Fossa, MD 03/23/15 [phone]

## 2015-03-22 NOTE — ED Notes (Signed)
6E RN Ryan to call [facility] to ensure they are able to take patient with Milrinone drip.

## 2015-03-22 NOTE — Telephone Encounter (Signed)
Received labs this AM from [facility], pt's labs were drawn yesterday and bun 72 cr 6.74 which is very elevated for pt, have attempted to call pt several times this AM and left VM, have called pt's partner [name] she is aware and state pt was vomiting last week and now has diarrhea, everything she eats is coming right out, she also reports pt is very fatigued and sleeps a lot.  Advised cr is extremely elevated and pt needs to go to ER ASAP, she is agreeable and will take pt there

## 2015-03-22 NOTE — ED Notes (Signed)
Attempted report to 3E. 

## 2015-03-22 NOTE — ED Notes (Signed)
Pt placed in gown and in bed. Pt monitored by pulse ox, bp cuff, and 12-lead. 

## 2015-03-22 NOTE — H&P (Signed)
[facility] Admission History and Physical       Andrea [name] ZOX:[id number] DOB: 01-03-1951 DOA: 03/22/2015  Referring physician: EDP PCP: Lolita Patella, MD  Specialists:   Chief Complaint: Abnormal Labs  HPI: Andrea [name] is a 64 y.o. female with a history of Chronic Systolic CHF with an EF =15% on a Milrinone continuous Infusion, hx of HTN, DM2, Anemia, and CKD who was advised by her cardiologist to report to the ED due to abnormal labs.   Her potassium level was found to be 6.7 and her BUN/Cr was found to be 63/4.70.   Her last Creatinine was 1.1.  She reports that she had a stomach vrius last week, with Nausea, Vomiting, and Diarrhea.    She denies any chest pain.      Review of Systems:  Constitutional: No Weight Loss, No Weight Gain, Night Sweats, Fevers, Chills, Dizziness, Light Headedness, Fatigue, or Generalized Weakness HEENT: No Headaches, Difficulty Swallowing,Tooth/Dental Problems,Sore Throat,  No Sneezing, Rhinitis, Ear Ache, Nasal Congestion, or Post Nasal Drip,  Cardio-vascular:  No Chest pain, Orthopnea, PND, Edema in Lower Extremities, Anasarca, Dizziness, Palpitations  Resp: No Dyspnea, No DOE, No Productive Cough, No Non-Productive Cough, No Hemoptysis, No Wheezing.    GI: No Heartburn, Indigestion, Abdominal Pain, Nausea, Vomiting, Diarrhea, Constipation, Hematemesis, Hematochezia, Melena, Change in Bowel Habits,  Loss of Appetite  GU: No Dysuria, No Change in Color of Urine, No Urgency or Urinary Frequency, No Flank pain.  Musculoskeletal: No Joint Pain or Swelling, No Decreased Range of Motion, No Back Pain.  Neurologic: No Syncope, No Seizures, Muscle Weakness, Paresthesia, Vision Disturbance or Loss, No Diplopia, No Vertigo, No Difficulty Walking,  Skin: No Rash or Lesions. Psych: No Change in Mood or Affect, No Depression or Anxiety, No Memory loss, No Confusion, or Hallucinations   Past Medical History  Diagnosis Date  .  Chronic systolic heart failure   . Hypertension   . Stroke 02/2010    left brain CVA? recurrent TIA's treated with TPA  . Dyslipidemia   . Premature ventricular contractions (PVCs) (VPCs)   . GERD (gastroesophageal reflux disease)   . Coronary artery disease     a. DES-dRCA 11/2010. b. DES-pRCA 04/2011 (in an area free of disease on prior cath). c. s/p DES-mRCA 03/2013 for NSTEMI.  . Ischemic cardiomyopathy     a. Hx of medtronic ICD. b. EF 15-20% by cath 03/2013.  . Pulmonary hypertension   . Gout   . Obesity   . Thrombocytopenia   . Depression   . Hypotension     a. Admission 09/2012 for hypotn & ARF. b. Meds held 03/2013 due to hypotension.  . Acute renal insufficiency     a. 09/2012. b. Also noted post-cath 03/2013.  . NSTEMI (non-ST elevated myocardial infarction) 03/2013  . Anginal pain   . Asthma   . Microcytic anemia     a. Noted 03/2013 on labs, iron studies WNL.  Marland Kitchen Anxiety   . CHF (congestive heart failure)   . CKD (chronic kidney disease) stage 3, GFR 30-59 ml/min   . HTN (hypertension)   . Primary pulmonary HTN   . Automatic implantable cardiac defibrillator - Medtronic 08/24/2013  . Heart murmur   . Type II diabetes mellitus   . Migraine headache     "no pain; aura/visual problems only; at least 2-3X/month" (01/18/2014)  . Arthritis     "knees" (01/18/2014)  . DJD (degenerative joint disease)   . Bipolar disorder  Past Surgical History  Procedure Laterality Date  . Mass excision Left     hip  . Cervical polypectomy  1970's  . US echocardiography  2011  . Tonsillectomy  1960's?  . Right oophorectomy  1980's  . Dilation and curettage of uterus  1970's  . Ostectomy metatarsal Left 1993    "5th toe; from rickets" (05/07/2013)  . Implantable cardioverter defibrillator implant  ~ 2008  . Coronary angioplasty with stent placement  2000's    "2 stents" (05/07/2013)  . Coronary angioplasty with stent placement  03/2013    PCI to RCA/notes 05/05/2013; "makes total of  3" (05/07/2013)  . Left heart catheterization with coronary angiogram N/A 04/27/2013    Procedure: LEFT HEART CATHETERIZATION WITH CORONARY ANGIOGRAM;  Surgeon: Kathleene Hazel, MD;  Location: [facility] CATH LAB;  Service: Cardiovascular;  Laterality: N/A;  . Percutaneous coronary stent intervention (pci-s)  04/27/2013    Procedure: PERCUTANEOUS CORONARY STENT INTERVENTION (PCI-S);  Surgeon: Kathleene Hazel, MD;  Location: [facility] CATH LAB;  Service: Cardiovascular;;  Mid RCA   . Left and right heart catheterization with coronary angiogram N/A 04/12/2014    Procedure: LEFT AND RIGHT HEART CATHETERIZATION WITH CORONARY ANGIOGRAM;  Surgeon: Dolores Patty, MD;  Location: [facility] CATH LAB;  Service: Cardiovascular;  Laterality: N/A;      Prior to Admission medications   Medication Sig Start Date End Date Taking? Authorizing Provider  acetaminophen (TYLENOL) 500 MG tablet Take 500 mg by mouth every 6 (six) hours as needed for mild pain.   Yes Historical Provider, MD  calcitRIOL (ROCALTROL) 0.25 MCG capsule Take 1 capsule by mouth daily. 01/07/15  Yes Historical Provider, MD  diclofenac sodium (VOLTAREN) 1 % GEL Apply 2 g topically 4 (four) times daily as needed (pain).    Yes Historical Provider, MD  digoxin (LANOXIN) 0.125 MG tablet Take 1 tablet (0.125 mg total) by mouth daily. 02/03/15  Yes Dolores Patty, MD  DULoxetine (CYMBALTA) 60 MG capsule Take 60 mg by mouth daily.   Yes Historical Provider, MD  furosemide (LASIX) 40 MG tablet Take 1 tablet (40 mg total) by mouth 3 (three) times a week. Mon-Wed-Fri Patient taking differently: Take 40 mg by mouth every Monday, Wednesday, and Friday.  09/13/14  Yes Amy D Clegg, NP  gabapentin (NEURONTIN) 100 MG capsule Take 200 mg by mouth at bedtime.   Yes Historical Provider, MD  magnesium oxide (MAG-OX) 400 (241.3 MG) MG tablet Take 1 tablet (400 mg total) by mouth 3 (three) times daily. 08/09/14  Yes Bevelyn Buckles Bensimhon, MD  ondansetron (ZOFRAN ODT) 8 MG  disintegrating tablet Take 1 tablet (8 mg total) by mouth every 8 (eight) hours as needed for nausea or vomiting. 03/16/15  Yes Marisa Severin, MD  pantoprazole (PROTONIX) 40 MG tablet Take 1 tablet by mouth daily. 01/07/15  Yes Historical Provider, MD  polyethylene glycol (MIRALAX / GLYCOLAX) packet Take 17 g by mouth daily as needed for mild constipation.   Yes Historical Provider, MD  ranitidine (ZANTAC) 300 MG tablet Take 1 tablet by mouth daily. 12/20/14  Yes Historical Provider, MD  rOPINIRole (REQUIP) 2 MG tablet Take 0.5 tablets by mouth at bedtime. 12/20/14  Yes Historical Provider, MD  zolpidem (AMBIEN) 5 MG tablet Take 1 tablet by mouth at bedtime. 12/20/14  Yes Historical Provider, MD  milrinone (PRIMACOR) 20 MG/100ML SOLN infusion Inject 30.225 mcg/min into the vein continuous. Patient not taking: Reported on 03/15/2015 04/12/14   Aundria Rud, NP  nitroGLYCERIN (  NITROSTAT) 0.4 MG SL tablet Place 1 tablet (0.4 mg total) under the tongue every 5 (five) minutes as needed for chest pain (MAX 3 TABLETS IN 15 MINUTES). 01/17/15   Marinus Maw, MD     Allergies  Allergen Reactions  . Aspirin Shortness Of Breath and Other (See Comments)    Asthma symptoms  . Brilinta [Ticagrelor] Shortness Of Breath and Other (See Comments)    Gout   . Percocet [Oxycodone-Acetaminophen] Nausea And Vomiting    Patient can tolerate acetaminophen solely  . Darvon Nausea And Vomiting  . Diovan [Valsartan] Other (See Comments) and Nausea Only    unknown  . Imitrex [Sumatriptan Base] Other (See Comments)    Unknown reaction  . Licorice Flavor [Artificial Licorice Flavor]     Black licorice induces asthma   . Nsaids Nausea And Vomiting and Other (See Comments)    GI upset   . Other Other (See Comments)    Black Licorice  - causes asthma attach    Social History:  reports that she quit smoking about 32 years ago. Her smoking use included Cigarettes. She has a .25 pack-year smoking history. She has never  used smokeless tobacco. She reports that she does not drink alcohol or use illicit drugs.    Family History  Problem Relation Age of Onset  . Heart failure Mother   . Heart attack Father   . Heart disease Brother        Physical Exam:  GEN:  Pleasant Obese  64 y.o. African American  female examined and in no acute distress; cooperative with exam Filed Vitals:   03/22/15 1800 03/22/15 1830 03/22/15 1900 03/22/15 1930  BP: 133/72 133/65 119/60 116/50  Pulse: 86 89 85 81  Temp:      TempSrc:      Resp: SpO2: 98% 93% 95% 95%   Blood pressure 116/50, pulse 81, temperature 97.4 F (36.3 C), temperature source Oral, resp. rate 17, SpO2 95 %. PSYCH: She is alert and oriented x4; does not appear anxious does not appear depressed; affect is normal HEENT: Normocephalic and Atraumatic, Mucous membranes pink; PERRLA; EOM intact; Fundi:  Benign;  No scleral icterus, Nares: Patent, Oropharynx: Clear, Fair Dentition,    Neck:  FROM, No Cervical Lymphadenopathy nor Thyromegaly or Carotid Bruit; No JVD; Breasts:: Not examined CHEST WALL: No tenderness CHEST: Normal respiration, clear to auscultation bilaterally HEART: Regular rate and rhythm; no murmurs rubs or gallops BACK: No kyphosis or scoliosis; No CVA tenderness ABDOMEN: Positive Bowel Sounds, Obese, Soft Non-Tender, No Rebound or Guarding; No Masses, No Organomegaly. Rectal Exam: Not done EXTREMITIES: No Cyanosis, Clubbing, or Edema; No Ulcerations. Genitalia: not examined PULSES: 2+ and symmetric SKIN: Normal hydration no rash or ulceration CNS:  Alert and Oriented x 4, No Focal Deficits Vascular: pulses palpable throughout    Labs on Admission:  Basic Metabolic Panel:  Recent Labs Lab 03/22/15 1601  NA 136  K 6.7*  CL 107  CO2 19  GLUCOSE 79  BUN 63*  CREATININE 4.70*  CALCIUM 8.8   Liver Function Tests:  Recent Labs Lab 03/22/15 1601  AST 97*  ALT 30  ALKPHOS 88  BILITOT 1.6*  PROT 7.4  ALBUMIN  3.0*   No results for input(s): LIPASE, AMYLASE in the last 168 hours. No results for input(s): AMMONIA in the last 168 hours. CBC:  Recent Labs Lab 03/22/15 1601  WBC 3.8*  NEUTROABS 2.4  HGB 9.7*  HCT 29.3*  MCV 60.4*  PLT 235   Cardiac Enzymes:  Recent Labs Lab 03/22/15 1601  TROPONINI 0.12*    BNP (last 3 results)  Recent Labs  03/15/15 2150 03/22/15 1601  BNP 701.4* 930.9*    ProBNP (last 3 results)  Recent Labs  11/08/14 1800  PROBNP 2681.0*    CBG: No results for input(s): GLUCAP in the last 168 hours.  Radiological Exams on Admission: Dg Chest 2 View  03/22/2015   CLINICAL DATA:  64 year old female with weakness, dizziness, hypertension. Initial encounter.  EXAM: CHEST  2 VIEW  COMPARISON:  CT Abdomen and Pelvis 11/08/2014 and earlier.  FINDINGS: Small to moderate right pleural effusion appears increased since November 2015. Stable cardiomegaly and mediastinal contours. Stable left chest cardiac AICD. Right chest tunneled catheter is new since November. No pneumothorax or pulmonary edema. No consolidation. Stable visualized osseous structures. Calcified atherosclerosis of the aorta.  IMPRESSION: Increased right pleural effusion since November 2015, now small to moderate.  No other acute cardiopulmonary abnormality.   Electronically Signed   By: Odessa Fleming M.D.   On: 03/22/2015 15:51     EKG: Independently reviewed.    Assessment/Plan:   64 y.o. female with  Principal Problem:   1.   AKI (acute kidney injury)   Renal consulted    Recommended no IVFs yet due to #3   Hold Lasix, Digoxin, and Magnesium Rx   Active Problems:   2.   Hyperkalemia   Kayexalate 30 gram PO x 1 given in ED   Check K+every 6 hours x 3    Redose with Kayexalate as needed for K+ > 5.2   Cardiac Monitoring     3.   Chronic systolic congestive heart failure, NYHA class 4-EF 15% per ECHO 2011   Cards Consulted    Sees Dr Clarise Cruz   On Milrinone continuous  Infusion   Cardiac Monitoring     4.  Elevated Troponin- due to ACS, vs, Demand Ischemia vs CHF   Cycle Troponins   Cards Consulted     5.   Hypertension   Monitor BPs       6.   Diabetes mellitus type 2, controlled   SSI coverage PRN   Check HbA1C     7.   CKD (chronic kidney disease)   Monitor BUN/Cr     8.   DVT Prophylaxis    SQ Heparin         Code Status:     FULL CODE       Family Communication:    No Family Present   Disposition Plan:    Inpatient  Status        Time spent:  49  Minutes      Ron Parker [facility] Pager [phone]   If 7AM -7PM Please Contact the Day Rounding Team MD for [facility]  If 7PM-7AM, Please Contact Night-Floor Coverage  [email or web address] Password [email or web address] 03/22/2015, 8:12 PM     ADDENDUM:   Patient was seen and examined on 03/22/2015

## 2015-03-22 NOTE — Consult Note (Signed)
Andrea [name] Admit Date: 03/22/2015 03/22/2015 [name] Requesting Physician:  Madilyn Hook MD  Reason for Consult:  AKI, Hyperkalemia HPI:  64 year old female seen at the request of Dr. Madilyn Hook for the evaluation of elevated creatinine and hyperkalemia. Patient has a history of chronic systolic heart failure on chronic milrinone therapy, coronary artery disease, congestive hepatopathy, status post ICD, history of stroke. Last week she presented to emergency room with nausea, vomiting, diarrhea. No she is not on an ACE inhibitor or ARB but does take furosemide. She received a CT of the abdomen and pelvis with contrast. There were no acute findings but some small pleural effusions. She returned home. She had blood work performed yesterday with reported BUN of 72 and creatinine 6.74. When she was successfully contacted she was encouraged to present to the emergency room. Her creatinine is 4.7 with potassium of 6.7 and a bicarbonate of 19. Her BUN is 63.  The patient denies recent changes in her exertion dyspnea, no orthopnea, no PND. She has had no recent antibiotics. She denies NSAID use. No lower extremity edema. Currently no further nausea, vomiting, diarrhea. No anorexia, dysgeusia, hiccups, confusion. She thinks maybe her urine output has dropped off a little but not very much.    CREATININE, SER (mg/dL)  Date Value  16/10/9603 4.70*  03/15/2015 1.14*  11/10/2014 1.12*  11/09/2014 1.33*  11/08/2014 1.41*  11/08/2014 1.20*  04/12/2014 1.12*  03/29/2014 1.34*  03/24/2014 2.00*  03/23/2014 1.94*  ] I/Os:  ROS Balance of 12 systems is negative w/ exceptions as above  PMH  Past Medical History  Diagnosis Date  . Chronic systolic heart failure   . Hypertension   . Stroke 02/2010    left brain CVA? recurrent TIA's treated with TPA  . Dyslipidemia   . Premature ventricular contractions (PVCs) (VPCs)   . GERD (gastroesophageal reflux disease)   . Coronary artery disease     a.  DES-dRCA 11/2010. b. DES-pRCA 04/2011 (in an area free of disease on prior cath). c. s/p DES-mRCA 03/2013 for NSTEMI.  . Ischemic cardiomyopathy     a. Hx of medtronic ICD. b. EF 15-20% by cath 03/2013.  . Pulmonary hypertension   . Gout   . Obesity   . Thrombocytopenia   . Depression   . Hypotension     a. Admission 09/2012 for hypotn & ARF. b. Meds held 03/2013 due to hypotension.  . Acute renal insufficiency     a. 09/2012. b. Also noted post-cath 03/2013.  . NSTEMI (non-ST elevated myocardial infarction) 03/2013  . Anginal pain   . Asthma   . Microcytic anemia     a. Noted 03/2013 on labs, iron studies WNL.  Marland Kitchen Anxiety   . CHF (congestive heart failure)   . CKD (chronic kidney disease) stage 3, GFR 30-59 ml/min   . HTN (hypertension)   . Primary pulmonary HTN   . Automatic implantable cardiac defibrillator - Medtronic 08/24/2013  . Heart murmur   . Type II diabetes mellitus   . Migraine headache     "no pain; aura/visual problems only; at least 2-3X/month" (01/18/2014)  . Arthritis     "knees" (01/18/2014)  . DJD (degenerative joint disease)   . Bipolar disorder    PSH  Past Surgical History  Procedure Laterality Date  . Mass excision Left     hip  . Cervical polypectomy  1970's  . US echocardiography  2011  . Tonsillectomy  1960's?  . Right oophorectomy  1980's  .  Dilation and curettage of uterus  1970's  . Ostectomy metatarsal Left 1993    "5th toe; from rickets" (05/07/2013)  . Implantable cardioverter defibrillator implant  ~ 2008  . Coronary angioplasty with stent placement  2000's    "2 stents" (05/07/2013)  . Coronary angioplasty with stent placement  03/2013    PCI to RCA/notes 05/05/2013; "makes total of 3" (05/07/2013)  . Left heart catheterization with coronary angiogram N/A 04/27/2013    Procedure: LEFT HEART CATHETERIZATION WITH CORONARY ANGIOGRAM;  Surgeon: Kathleene Hazel, MD;  Location[facility] CATH LAB;  Service: Cardiovascular;  Laterality: N/A;  . Percutaneous  coronary stent intervention (pci-s)  04/27/2013    Procedure: PERCUTANEOUS CORONARY STENT INTERVENTION (PCI-S);  Surgeon: Kathleene Hazel, MD;  Location: [facility] CATH LAB;  Service: Cardiovascular;;  Mid RCA   . Left and right heart catheterization with coronary angiogram N/A 04/12/2014    Procedure: LEFT AND RIGHT HEART CATHETERIZATION WITH CORONARY ANGIOGRAM;  Surgeon: Dolores Patty, MD;  Location: [facility] CATH LAB;  Service: Cardiovascular;  Laterality: N/A;   FH  Family History  Problem Relation Age of Onset  . Heart failure Mother   . Heart attack Father   . Heart disease Brother    SH  reports that she quit smoking about 32 years ago. Her smoking use included Cigarettes. She has a .25 pack-year smoking history. She has never used smokeless tobacco. She reports that she does not drink alcohol or use illicit drugs. Allergies  Allergies  Allergen Reactions  . Aspirin Shortness Of Breath and Other (See Comments)    Asthma symptoms  . Brilinta [Ticagrelor] Shortness Of Breath and Other (See Comments)    Gout   . Percocet [Oxycodone-Acetaminophen] Nausea And Vomiting    Patient can tolerate acetaminophen solely  . Darvon Nausea And Vomiting  . Diovan [Valsartan] Other (See Comments) and Nausea Only    unknown  . Imitrex [Sumatriptan Base] Other (See Comments)    Unknown reaction  . Licorice Flavor [Artificial Licorice Flavor]     Black licorice induces asthma   . Nsaids Nausea And Vomiting and Other (See Comments)    GI upset   . Other Other (See Comments)    Black Licorice  - causes asthma attach   Home medications Prior to Admission medications   Medication Sig Start Date End Date Taking? Authorizing Provider  acetaminophen (TYLENOL) 500 MG tablet Take 500 mg by mouth every 6 (six) hours as needed for mild pain.   Yes Historical Provider, MD  calcitRIOL (ROCALTROL) 0.25 MCG capsule Take 1 capsule by mouth daily. 01/07/15  Yes Historical Provider, MD  diclofenac sodium  (VOLTAREN) 1 % GEL Apply 2 g topically 4 (four) times daily as needed (pain).    Yes Historical Provider, MD  digoxin (LANOXIN) 0.125 MG tablet Take 1 tablet (0.125 mg total) by mouth daily. 02/03/15  Yes Dolores Patty, MD  DULoxetine (CYMBALTA) 60 MG capsule Take 60 mg by mouth daily.   Yes Historical Provider, MD  furosemide (LASIX) 40 MG tablet Take 1 tablet (40 mg total) by mouth 3 (three) times a week. Mon-Wed-Fri Patient taking differently: Take 40 mg by mouth every Monday, Wednesday, and Friday.  09/13/14  Yes Amy D Clegg, NP  gabapentin (NEURONTIN) 100 MG capsule Take 200 mg by mouth at bedtime.   Yes Historical Provider, MD  magnesium oxide (MAG-OX) 400 (241.3 MG) MG tablet Take 1 tablet (400 mg total) by mouth 3 (three) times daily. 08/09/14  Yes  Dolores Patty, MD  ondansetron (ZOFRAN ODT) 8 MG disintegrating tablet Take 1 tablet (8 mg total) by mouth every 8 (eight) hours as needed for nausea or vomiting. 03/16/15  Yes Marisa Severin, MD  pantoprazole (PROTONIX) 40 MG tablet Take 1 tablet by mouth daily. 01/07/15  Yes Historical Provider, MD  polyethylene glycol (MIRALAX / GLYCOLAX) packet Take 17 g by mouth daily as needed for mild constipation.   Yes Historical Provider, MD  ranitidine (ZANTAC) 300 MG tablet Take 1 tablet by mouth daily. 12/20/14  Yes Historical Provider, MD  rOPINIRole (REQUIP) 2 MG tablet Take 0.5 tablets by mouth at bedtime. 12/20/14  Yes Historical Provider, MD  zolpidem (AMBIEN) 5 MG tablet Take 1 tablet by mouth at bedtime. 12/20/14  Yes Historical Provider, MD  milrinone (PRIMACOR) 20 MG/100ML SOLN infusion Inject 30.225 mcg/min into the vein continuous. Patient not taking: Reported on 03/15/2015 04/12/14   Aundria Rud, NP  nitroGLYCERIN (NITROSTAT) 0.4 MG SL tablet Place 1 tablet (0.4 mg total) under the tongue every 5 (five) minutes as needed for chest pain (MAX 3 TABLETS IN 15 MINUTES). 01/17/15   Marinus Maw, MD    Current Medications Scheduled Meds: .  calcitRIOL  0.25 mcg Oral Daily  . [START ON 03/23/2015] DULoxetine  60 mg Oral Daily  . gabapentin  200 mg Oral QHS  . heparin  5,000 Units Subcutaneous 3 times per day  . [START ON 03/23/2015] insulin aspart  0-9 Units Subcutaneous TID WC  . pantoprazole  40 mg Oral Daily  . rOPINIRole  2 mg Oral QHS  . sodium chloride  3 mL Intravenous Q12H  . sodium chloride  3 mL Intravenous Q12H  . zolpidem  5 mg Oral QHS   Continuous Infusions: . milrinone     PRN Meds:.sodium chloride, HYDROmorphone (DILAUDID) injection, nitroGLYCERIN, ondansetron **OR** ondansetron (ZOFRAN) IV, sodium chloride  CBC  Recent Labs Lab 03/22/15 1601  WBC 3.8*  NEUTROABS 2.4  HGB 9.7*  HCT 29.3*  MCV 60.4*  PLT 235   Basic Metabolic Panel  Recent Labs Lab 03/22/15 1601  NA 136  K 6.7*  CL 107  CO2 19  GLUCOSE 79  BUN 63*  CREATININE 4.70*  CALCIUM 8.8    Physical Exam  Blood pressure 130/60, pulse 82, temperature 97.9 F (36.6 C), temperature source Oral, resp. rate 18, height 5' (1.524 m), weight 79.924 kg (176 lb 3.2 oz), SpO2 99 %. GEN: NAD, comfortable appearing ENT: NCAT EYES: EOMI CV: RRR, nl s1s2 PULM: CTAB, no crackles ABD: s/nt/nd SKIN: no rashes/lesions EXT:no LEE NEURO: nonfocal  EKG 3/22 w/o peaked Ts or widened QRS  Assessment/Plan  64 year old female with chronic systolic heart failure on a milrinone drip and mild chronic kidney disease presenting with acute kidney injury and hyperkalemia after contrast study approximately one week ago.  1. AKI 2/2 Contrast Nephropathy 1. BL SCr in low 1s 2. IV contrast exposure 3/16 in setting of N/V/D 3. Hopeful for recovery with time, comorbidities lessen the likelihood Daily weights, Daily Renal Panel, Strict I/Os, Avoid nephrotoxins (NSAIDs, judicious IV Contrast) No RRT indication at tcurrent time, as per #2 2. Hyperkalemia w/o EKG changes 1. No indication for HD currently 2. To rec 30gm kayexalate PO 3. Follow morning  labs 3. Chronic systolic HF on milrionoe gtt 1. AHF to follow 2. Appears euvolemic largely though somewhat inc size of pleural effusiosn 3. Hold furosemide 4. HTN   Sabra Heck MD [phone] pgr 03/22/2015, 9:01 PM

## 2015-03-22 NOTE — ED Notes (Signed)
IV Team and Phlebotomy at bedside.

## 2015-03-22 NOTE — ED Provider Notes (Signed)
CSN: [id number]     Arrival date & time 03/22/15  1421 History   First MD Initiated Contact with Patient 03/22/15 1430     Chief Complaint  Patient presents with  . Abnormal Lab  . Dizziness   (Consider location/radiation/quality/duration/timing/severity/associated sxs/prior Treatment) HPI  Andrea [name] is a 64 yo female presenting with report of an abnormal lab.  She states she was called today from Dr. Prescott Gum office telling her she her labs were abnormal, from review of the EMR it appears she had an elevated BUN/Creatinine when her labs were drawn yesterday.  She reports recent GI illness. She had n/v/d last week that recently resolved 2 days ago.  She reports feeling generally weak and tired but denies decreased urination, chest pain, shortness of breath, or light-headedness.     Past Medical History  Diagnosis Date  . Chronic systolic heart failure   . Hypertension   . Stroke 02/2010    left brain CVA? recurrent TIA's treated with TPA  . Dyslipidemia   . Premature ventricular contractions (PVCs) (VPCs)   . GERD (gastroesophageal reflux disease)   . Coronary artery disease     a. DES-dRCA 11/2010. b. DES-pRCA 04/2011 (in an area free of disease on prior cath). c. s/p DES-mRCA 03/2013 for NSTEMI.  . Ischemic cardiomyopathy     a. Hx of medtronic ICD. b. EF 15-20% by cath 03/2013.  . Pulmonary hypertension   . Gout   . Obesity   . Thrombocytopenia   . Depression   . Hypotension     a. Admission 09/2012 for hypotn & ARF. b. Meds held 03/2013 due to hypotension.  . Acute renal insufficiency     a. 09/2012. b. Also noted post-cath 03/2013.  . NSTEMI (non-ST elevated myocardial infarction) 03/2013  . Anginal pain   . Asthma   . Microcytic anemia     a. Noted 03/2013 on labs, iron studies WNL.  Marland Kitchen Anxiety   . CHF (congestive heart failure)   . CKD (chronic kidney disease) stage 3, GFR 30-59 ml/min   . HTN (hypertension)   . Primary pulmonary HTN   . Automatic implantable  cardiac defibrillator - Medtronic 08/24/2013  . Heart murmur   . Type II diabetes mellitus   . Migraine headache     "no pain; aura/visual problems only; at least 2-3X/month" (01/18/2014)  . Arthritis     "knees" (01/18/2014)  . DJD (degenerative joint disease)   . Bipolar disorder    Past Surgical History  Procedure Laterality Date  . Mass excision Left     hip  . Cervical polypectomy  1970's  . US echocardiography  2011  . Tonsillectomy  1960's?  . Right oophorectomy  1980's  . Dilation and curettage of uterus  1970's  . Ostectomy metatarsal Left 1993    "5th toe; from rickets" (05/07/2013)  . Implantable cardioverter defibrillator implant  ~ 2008  . Coronary angioplasty with stent placement  2000's    "2 stents" (05/07/2013)  . Coronary angioplasty with stent placement  03/2013    PCI to RCA/notes 05/05/2013; "makes total of 3" (05/07/2013)  . Left heart catheterization with coronary angiogram N/A 04/27/2013    Procedure: LEFT HEART CATHETERIZATION WITH CORONARY ANGIOGRAM;  Surgeon: Kathleene Hazel, MD;  Location: [facility] CATH LAB;  Service: Cardiovascular;  Laterality: N/A;  . Percutaneous coronary stent intervention (pci-s)  04/27/2013    Procedure: PERCUTANEOUS CORONARY STENT INTERVENTION (PCI-S);  Surgeon: Kathleene Hazel, MD;  Location: [facility] CATH  LAB;  Service: Cardiovascular;;  Mid RCA   . Left and right heart catheterization with coronary angiogram N/A 04/12/2014    Procedure: LEFT AND RIGHT HEART CATHETERIZATION WITH CORONARY ANGIOGRAM;  Surgeon: Dolores Patty, MD;  Location: [facility] CATH LAB;  Service: Cardiovascular;  Laterality: N/A;   Family History  Problem Relation Age of Onset  . Heart failure Mother   . Heart attack Father   . Heart disease Brother    History  Substance Use Topics  . Smoking status: Former Smoker -- 0.05 packs/day for 5 years    Types: Cigarettes    Quit date: 04/23/1982  . Smokeless tobacco: Never Used  . Alcohol Use: No   OB History    No  data available     Review of Systems  Constitutional: Positive for fatigue. Negative for fever and chills.  HENT: Negative for sore throat.   Eyes: Negative for visual disturbance.  Respiratory: Negative for cough and shortness of breath.   Cardiovascular: Negative for chest pain and leg swelling.  Gastrointestinal: Positive for nausea, vomiting and diarrhea.  Genitourinary: Negative for dysuria.  Musculoskeletal: Negative for myalgias.  Skin: Negative for rash.  Neurological: Negative for weakness, numbness and headaches.    Allergies  Aspirin; Brilinta; Percocet; Darvon; Diovan; Imitrex; Licorice flavor; Nsaids; and Other  Home Medications   Prior to Admission medications   Medication Sig Start Date End Date Taking? Authorizing Provider  acetaminophen (TYLENOL) 500 MG tablet Take 500 mg by mouth every 6 (six) hours as needed for mild pain.    Historical Provider, MD  calcitRIOL (ROCALTROL) 0.25 MCG capsule Take 1 capsule by mouth daily. 01/07/15   Historical Provider, MD  diclofenac sodium (VOLTAREN) 1 % GEL Apply 2 g topically 4 (four) times daily as needed (pain).     Historical Provider, MD  digoxin (LANOXIN) 0.125 MG tablet Take 1 tablet (0.125 mg total) by mouth daily. 02/03/15   Dolores Patty, MD  DULoxetine (CYMBALTA) 60 MG capsule Take 60 mg by mouth daily.    Historical Provider, MD  furosemide (LASIX) 40 MG tablet Take 1 tablet (40 mg total) by mouth 3 (three) times a week. Mon-Wed-Fri Patient taking differently: Take 40 mg by mouth every Monday, Wednesday, and Friday.  09/13/14   Amy D Filbert Schilder, NP  gabapentin (NEURONTIN) 100 MG capsule Take 200 mg by mouth at bedtime.    Historical Provider, MD  hydrALAZINE (APRESOLINE) 50 MG tablet Take 1.5 tablets (75 mg total) by mouth 3 (three) times daily. 12/13/14   Aundria Rud, NP  magnesium oxide (MAG-OX) 400 (241.3 MG) MG tablet Take 1 tablet (400 mg total) by mouth 3 (three) times daily. 08/09/14   Dolores Patty, MD   milrinone [facility]) 20 MG/100ML SOLN infusion Inject 30.225 mcg/min into the vein continuous. Patient not taking: Reported on 03/15/2015 04/12/14   Aundria Rud, NP  nitroGLYCERIN (NITROSTAT) 0.4 MG SL tablet Place 1 tablet (0.4 mg total) under the tongue every 5 (five) minutes as needed for chest pain (MAX 3 TABLETS IN 15 MINUTES). 01/17/15   Marinus Maw, MD  ondansetron (ZOFRAN ODT) 8 MG disintegrating tablet Take 1 tablet (8 mg total) by mouth every 8 (eight) hours as needed for nausea or vomiting. 03/16/15   Marisa Severin, MD  pantoprazole (PROTONIX) 40 MG tablet Take 1 tablet by mouth daily. 01/07/15   Historical Provider, MD  polyethylene glycol (MIRALAX / GLYCOLAX) packet Take 17 g by mouth daily as needed for mild  constipation.    Historical Provider, MD  ranitidine (ZANTAC) 300 MG tablet Take 1 tablet by mouth daily. 12/20/14   Historical Provider, MD  rOPINIRole (REQUIP) 2 MG tablet Take 0.5 tablets by mouth at bedtime. 12/20/14   Historical Provider, MD  zolpidem (AMBIEN) 5 MG tablet Take 1 tablet by mouth at bedtime. 12/20/14   Historical Provider, MD   BP 132/63 mmHg  Pulse 83  Resp 18  SpO2 95% Physical Exam  Constitutional: She appears well-developed and well-nourished. No distress.  HENT:  Head: Normocephalic and atraumatic.  Mouth/Throat: Oropharynx is clear and moist. No oropharyngeal exudate.  Eyes: Conjunctivae are normal.  Neck: Neck supple. No thyromegaly present.  Cardiovascular: Normal rate, regular rhythm and intact distal pulses.   Pulmonary/Chest: Effort normal and breath sounds normal. No respiratory distress. She has no wheezes. She has no rales. She exhibits no tenderness.  Abdominal: Soft. There is no tenderness.  Musculoskeletal: She exhibits no tenderness.  Lymphadenopathy:    She has no cervical adenopathy.  Neurological: She is alert.  Skin: Skin is warm and dry. No rash noted. She is not diaphoretic.  Psychiatric: She has a normal mood and affect.   Nursing note and vitals reviewed.   ED Course  Procedures (including critical care time) Labs Review Labs Reviewed - No data to display  Imaging Review No results found.   EKG Interpretation None      MDM   Final diagnoses:  None   64 yo on milrinone infusion for CHF, presenting with report abnormal labs drawn at home. EMR cardiology nsg note reports elevated BUN/creatinine. Pt reports recent GI illness including vomiting and diarrhea with last episode of either 2 days ago.  She currently denies chest pain or shortness of breath but complains of general weakness. Discussed case with Dr. Patria Mane. CBC, CMP, BNP,  Troponin, EKG and CXR.   4:05 PM: At end of shift, hand-off report given to [facility], PA-C.  Plan includes review labs and consult her cardiology team for recommendations.   Filed Vitals:   03/22/15 1423 03/22/15 1435 03/22/15 1443  BP:   132/63  Pulse:  83 83  Resp:  24 18  SpO2: 100% 96% 95%   Meds given in ED:  Medications - No data to display  New Prescriptions   No medications on file       Harle Battiest, NP 03/22/15 2115  Azalia Bilis, MD 03/24/15 [phone]

## 2015-03-23 LAB — TROPONIN I: Troponin I: 0.13 ng/mL — ABNORMAL HIGH (ref <0.031)

## 2015-03-23 LAB — BASIC METABOLIC PANEL
Anion gap: 5 (ref 5–15)
BUN: 58 mg/dL — ABNORMAL HIGH (ref 6–23)
CHLORIDE: 105 mmol/L (ref 96–112)
CO2: 27 mmol/L (ref 19–32)
Calcium: 8.8 mg/dL (ref 8.4–10.5)
Creatinine, Ser: 4.15 mg/dL — ABNORMAL HIGH (ref 0.50–1.10)
GFR calc non Af Amer: 11 mL/min — ABNORMAL LOW (ref >90)
GFR, EST AFRICAN AMERICAN: 12 mL/min — AB (ref >90)
Glucose, Bld: 105 mg/dL — ABNORMAL HIGH (ref 70–99)
POTASSIUM: 4 mmol/L (ref 3.5–5.1)
Sodium: 137 mmol/L (ref 135–145)

## 2015-03-23 LAB — CBC
HCT: 27.2 % — ABNORMAL LOW (ref 36.0–46.0)
Hemoglobin: 8.8 g/dL — ABNORMAL LOW (ref 12.0–15.0)
MCH: 19.6 pg — ABNORMAL LOW (ref 26.0–34.0)
MCHC: 32.4 g/dL (ref 30.0–36.0)
MCV: 60.6 fL — AB (ref 78.0–100.0)
Platelets: 196 10*3/uL (ref 150–400)
RBC: 4.49 MIL/uL (ref 3.87–5.11)
RDW: 21 % — AB (ref 11.5–15.5)
WBC: 3.2 10*3/uL — AB (ref 4.0–10.5)

## 2015-03-23 LAB — POTASSIUM: Potassium: 4.1 mmol/L (ref 3.5–5.1)

## 2015-03-23 LAB — GLUCOSE, CAPILLARY
GLUCOSE-CAPILLARY: 109 mg/dL — AB (ref 70–99)
GLUCOSE-CAPILLARY: 121 mg/dL — AB (ref 70–99)
Glucose-Capillary: 121 mg/dL — ABNORMAL HIGH (ref 70–99)
Glucose-Capillary: 82 mg/dL (ref 70–99)

## 2015-03-23 MED ORDER — SODIUM CHLORIDE 0.9 % IJ SOLN
10.0000 mL | INTRAMUSCULAR | Status: DC | PRN
  Administered 2015-03-24: 10 mL
  Filled 2015-03-23: qty 40

## 2015-03-23 MED ORDER — ACETAMINOPHEN 500 MG PO TABS
1000.0000 mg | ORAL_TABLET | Freq: Four times a day (QID) | ORAL | Status: DC | PRN
  Administered 2015-03-24: 1000 mg via ORAL
  Filled 2015-03-23 (×2): qty 2

## 2015-03-23 MED ORDER — FERROUS SULFATE 325 (65 FE) MG PO TABS
325.0000 mg | ORAL_TABLET | Freq: Three times a day (TID) | ORAL | Status: DC
  Administered 2015-03-23 – 2015-03-25 (×7): 325 mg via ORAL
  Filled 2015-03-23 (×11): qty 1

## 2015-03-23 NOTE — Telephone Encounter (Signed)
VO given to pattie to continue [facility] nursing  Ave Filter

## 2015-03-23 NOTE — Care Management Note (Signed)
    Page 1 of 1   03/27/2015     8:47:51 AM CARE MANAGEMENT NOTE 03/27/2015  Patient:  Andrea [name], Andrea [name]   Account Number:  [id number]  Date Initiated:  03/23/2015  Documentation initiated by:  AMERSON,JULIE  Subjective/Objective Assessment:   Pt adm on 03/22/15 with AKI.  PTA, pt resides at home with S.O.  Pt active with [facility] for home Milrinone drip.     Action/Plan:   [facility] aware of pt's admission.  Will cont to follow for dc needs as pt progresses.   Anticipated DC Date:  03/26/2015   Anticipated DC Plan:  [place]  CM consult      [facility] Choice  Resumption Of Svcs/PTA Provider   Choice offered to / List presented to:          [facility] arranged  HH-1 RN      [place].   Status of service:  Completed, signed off Medicare Important Message given?  NO (If response is "NO", the following Medicare IM given date fields will be blank) Date Medicare IM given:   Medicare IM given by:   Date Additional Medicare IM given:   Additional Medicare IM given by:    Discharge Disposition:  [place]  Per UR Regulation:  Reviewed for med. necessity/level of care/duration of stay  If discussed at [place] of Stay Meetings, dates discussed:    Comments:  03/26/15 Cm received call from [facility] requesting we couple the campus milrinone hook ups for their RN.  CM met with pt who can go home after 14:00 and CM relayed information to [employer] and [place], Joellen [place].  No other Cm needs were communicated.  Tempie Hoist, BSN, Cm [phone].

## 2015-03-23 NOTE — Progress Notes (Signed)
[facility]  Patient Status:   Active pt with [facility] prior to this readmission  [facility] is providing the following services: HHRN and Home [facility]fusion Pharm[facility] team for home Milrinone therapy.  [facility] hospital team will follow pt while here and support transition home when ordered.  If patient discharges after hours, please call [phone].   Sedalia Muta 03/23/2015, 12:36 PM

## 2015-03-23 NOTE — Progress Notes (Signed)
[facility] PROGRESS NOTE  CEAIRA ERNSTER ZOX:[id number] DOB: 1951/01/08 DOA: 03/22/2015 PCP: Lolita Patella, MD  Brief Summary  The patient is a 64 year old female with history of chronic systolic heart failure with ejection fraction of 15% on continuous milrinone infusion, hypertension, type 2 diabetes mellitus, anemia of chronic disease, chronic kidney disease stage III, who was advised to come to the emergency department secondary to abnormal labs. Her potassium was 6.7 and her creatinine was up to 4.7 with a baseline of 1.1. One week prior to presentation she had nausea, vomiting, and diarrhea which have since resolved.  Assessment/Plan  Acute kidney injury, likely due to ATN from dehydration, oliguric -  Appreciate nephrology assistance -  Continue to hold lasix, digoxin, magnesium -  Renally dose medications  -  Okay to continue milrinone as long as blood pressure stable  NYHA class III/IV ischemic systolic heart failure with EF 15% on home milrinone infusion -  Continue to hold diuretics due to AKI -  contineu milrinone  -  Daily weights -  Strict I/O:  -900 yesterday  Hyperkalemia -  Resolved with kayexelate  Sinus congestion and headache.  DDx includes URI v. Allergies -  Offered flonase and afrin, which she declined -  Continue claritin -  D/c IV dilaudid and start tylenol prn  Leukopenia, chronic and stable  Iron deficiency anemia -  Start iron supplementation -  TSH, B12, folate all wnl within last 6 months  Stable, but mildly elevated troponin likely secondary to AKI in setting of severe heart failure   Bipolar disorder, chronic, stable -  contiue cymbalta  T2DM with low normal CBG -  Continue low dose ssi  GERD, stable -  Continue protonix and ranitidine  Diet:  Diabetic, low sodium Access:  PICC line right chest IVF:  off Proph:  heparin  Code Status: full Family Communication:  Patient alone Disposition Plan: pending further  improvement in kidney function   Consultants:  Nephrology  Heart failure service  Procedures:  CXR  Antibiotics:  none   HPI/Subjective:  Denies SOB, weight loss or gain, increased or decreased LEE.  Feeling tired/fatigued.  Had headache that was better after IV dilaudid  Objective: Filed Vitals:   03/23/15 0122 03/23/15 0500 03/23/15 0519 03/23/15 1015  BP: 107/49  130/60 130/63  Pulse: 81  83 79  Temp: 97.6 F (36.4 C)  97.4 F (36.3 C)   TempSrc: Oral  Oral   Resp: 18  18   Height:      Weight:  79.969 kg (176 lb 4.8 oz)    SpO2: 96%  96%     Intake/Output Summary (Last 24 hours) at 03/23/15 1245 Last data filed at 03/23/15 1025  Gross per 24 hour  Intake    480 ml  Output   1525 ml  Net  -1045 ml   Filed Weights   03/22/15 2050 03/23/15 0500  Weight: 79.924 kg (176 lb 3.2 oz) 79.969 kg (176 lb 4.8 oz)    Exam:   General:  Adult female, No acute distress  HEENT:  NCAT, MMM, + JVP (?TVR).  Sounds nasal, but sinuses appear clear  Cardiovascular:  RRR, nl S1, S2, 2/6 systolic murmur, 2+ pulses, warm extremities  Respiratory:  CTAB, no increased WOB  Abdomen:   NABS, soft, NT/ND  MSK:   Normal tone and bulk, no LEE  Neuro:  Grossly intact  Data Reviewed: Basic Metabolic Panel:  Recent Labs Lab 03/22/15 1601 03/23/15 0505  NA  136 137  K 6.7* 4.1  4.0  CL 107 105  CO2 19 27  GLUCOSE 79 105*  BUN 63* 58*  CREATININE 4.70* 4.15*  CALCIUM 8.8 8.8   Liver Function Tests:  Recent Labs Lab 03/22/15 1601  AST 97*  ALT 30  ALKPHOS 88  BILITOT 1.6*  PROT 7.4  ALBUMIN 3.0*   No results for input(s): LIPASE, AMYLASE in the last 168 hours. No results for input(s): AMMONIA in the last 168 hours. CBC:  Recent Labs Lab 03/22/15 1601 03/23/15 0505  WBC 3.8* 3.2*  NEUTROABS 2.4  --   HGB 9.7* 8.8*  HCT 29.3* 27.2*  MCV 60.4* 60.6*  PLT 235 196   Cardiac Enzymes:  Recent Labs Lab 03/22/15 1601 03/23/15 0505  TROPONINI 0.12*  0.13*   BNP (last 3 results)  Recent Labs  03/15/15 2150 03/22/15 1601  BNP 701.4* 930.9*    ProBNP (last 3 results)  Recent Labs  11/08/14 1800  PROBNP 2681.0*    CBG:  Recent Labs Lab 03/22/15 2138 03/23/15 0611 03/23/15 1147  GLUCAP 96 121* 109*    No results found for this or any previous visit (from the past 240 hour(s)).   Studies: Dg Chest 2 View  03/22/2015   CLINICAL DATA:  64 year old female with weakness, dizziness, hypertension. Initial encounter.  EXAM: CHEST  2 VIEW  COMPARISON:  CT Abdomen and Pelvis 11/08/2014 and earlier.  FINDINGS: Small to moderate right pleural effusion appears increased since November 2015. Stable cardiomegaly and mediastinal contours. Stable left chest cardiac AICD. Right chest tunneled catheter is new since November. No pneumothorax or pulmonary edema. No consolidation. Stable visualized osseous structures. Calcified atherosclerosis of the aorta.  IMPRESSION: Increased right pleural effusion since November 2015, now small to moderate.  No other acute cardiopulmonary abnormality.   Electronically Signed   By: Odessa Fleming M.D.   On: 03/22/2015 15:51    Scheduled Meds: . calcitRIOL  0.25 mcg Oral Daily  . DULoxetine  60 mg Oral Daily  . gabapentin  200 mg Oral QHS  . heparin  5,000 Units Subcutaneous 3 times per day  . insulin aspart  0-9 Units Subcutaneous TID WC  . pantoprazole  40 mg Oral Daily  . rOPINIRole  2 mg Oral QHS  . sodium chloride  3 mL Intravenous Q12H  . sodium chloride  3 mL Intravenous Q12H  . zolpidem  5 mg Oral QHS   Continuous Infusions: . milrinone 0.375 mcg/kg/min (03/22/15 2319)    Principal Problem:   AKI (acute kidney injury) Active Problems:   Chronic systolic congestive heart failure, NYHA class 4-EF 15% per ECHO 2011   Hypertension   Diabetes mellitus type 2, controlled   CKD (chronic kidney disease)   Hyperkalemia    Time spent: 30 min    Loriene Taunton  [facility] Pager  [phone]. If 7PM-7AM, please contact night-coverage at [email or web address], password [facility] 03/23/2015, 12:45 PM  LOS: 1 day

## 2015-03-23 NOTE — Progress Notes (Signed)
[facility] ROUNDING NOTE   Subjective:   Interval History: no complaints  Objective:  Vital signs in last 24 hours:  Temp:  [97.4 F (36.3 C)-97.9 F (36.6 C)] 97.4 F (36.3 C) (03/23 0519) Pulse Rate:  [79-89] 79 (03/23 1015) Resp:  [16-24] 18 (03/23 0519) BP: (107-143)/(49-73) 130/63 mmHg (03/23 1015) SpO2:  [91 %-100 %] 96 % (03/23 0519) FiO2 (%):  [0 %] 0 % (03/22 2025) Weight:  [79.924 kg (176 lb 3.2 oz)-79.969 kg (176 lb 4.8 oz)] 79.969 kg (176 lb 4.8 oz) (03/23 0500)  Weight change:  Filed Weights   03/22/15 2050 03/23/15 0500  Weight: 79.924 kg (176 lb 3.2 oz) 79.969 kg (176 lb 4.8 oz)    Intake/Output: I/O last 3 completed shifts: In: 240 [P.O.:240] Out: 1225 [Urine:175; Stool:1050]   Intake/Output this shift:  Total I/O In: -  Out: 300 [Urine:100; Stool:200]  CVS- RRR RS- CTA ABD- BS present soft non-distended EXT- no edema   Basic Metabolic Panel:  Recent Labs Lab 03/22/15 1601 03/23/15 0505  NA 136 137  K 6.7* 4.1  4.0  CL 107 105  CO2 19 27  GLUCOSE 79 105*  BUN 63* 58*  CREATININE 4.70* 4.15*  CALCIUM 8.8 8.8    Liver Function Tests:  Recent Labs Lab 03/22/15 1601  AST 97*  ALT 30  ALKPHOS 88  BILITOT 1.6*  PROT 7.4  ALBUMIN 3.0*   No results for input(s): LIPASE, AMYLASE in the last 168 hours. No results for input(s): AMMONIA in the last 168 hours.  CBC:  Recent Labs Lab 03/22/15 1601 03/23/15 0505  WBC 3.8* 3.2*  NEUTROABS 2.4  --   HGB 9.7* 8.8*  HCT 29.3* 27.2*  MCV 60.4* 60.6*  PLT 235 196    Cardiac Enzymes:  Recent Labs Lab 03/22/15 1601 03/23/15 0505  TROPONINI 0.12* 0.13*    BNP: Invalid input(s): POCBNP  CBG:  Recent Labs Lab 03/22/15 2138 03/23/15 0611  GLUCAP 96 121*    Microbiology: Results for orders placed or performed during the hospital encounter of 11/08/14  Culture, blood (routine x 2)     Status: None   Collection Time: 11/08/14  8:10 PM  Result Value Ref  Range Status   Specimen Description BLOOD LEFT ARM  Final   Special Requests BOTTLES DRAWN AEROBIC AND ANAEROBIC 10CC  Final   Culture  Setup Time   Final    11/09/2014 01:22 Performed at [facility]    Culture   Final    STAPHYLOCOCCUS SPECIES (COAGULASE NEGATIVE) Note: THE SIGNIFICANCE OF ISOLATING THIS ORGANISM FROM A SINGLE SET OF BLOOD CULTURES WHEN MULTIPLE SETS ARE DRAWN IS UNCERTAIN. PLEASE NOTIFY THE MICROBIOLOGY DEPARTMENT WITHIN ONE WEEK IF SPECIATION AND SENSITIVITIES ARE REQUIRED. Note: Gram Stain Report Called to,Read Back By and Verified With: MATT YORK ON 11/09/2014 AT 11:28P BY WILEJ Performed at [facility]    Report Status 11/11/2014 FINAL  Final  Culture, blood (routine x 2)     Status: None   Collection Time: 11/08/14  8:15 PM  Result Value Ref Range Status   Specimen Description BLOOD LEFT HAND  Final   Special Requests BOTTLES DRAWN AEROBIC ONLY 10CC  Final   Culture  Setup Time   Final    11/09/2014 01:22 Performed at [facility]    Culture   Final    STAPHYLOCOCCUS SPECIES (COAGULASE NEGATIVE) Note: RIFAMPIN AND GENTAMICIN SHOULD NOT BE USED AS SINGLE DRUGS FOR TREATMENT OF STAPH  INFECTIONS. This organism DOES NOT demonstrate inducible Clindamycin resistance in vitro. Note: Gram Stain Report Called to,Read Back By and Verified With: Danae Chen @ 1459 ON [phone] BY COOKV Performed at [facility]    Report Status 11/13/2014 FINAL  Final   Organism ID, Bacteria STAPHYLOCOCCUS SPECIES (COAGULASE NEGATIVE)  Final      Susceptibility   Staphylococcus species (coagulase negative) - MIC*    CLINDAMYCIN <=0.25 SENSITIVE Sensitive     ERYTHROMYCIN >=8 RESISTANT Resistant     GENTAMICIN <=0.5 SENSITIVE Sensitive     LEVOFLOXACIN <=0.12 SENSITIVE Sensitive     OXACILLIN >=4 RESISTANT Resistant     PENICILLIN >=0.5 RESISTANT Resistant     RIFAMPIN <=0.5 SENSITIVE Sensitive     TRIMETH/SULFA <=10 SENSITIVE Sensitive      VANCOMYCIN 2 SENSITIVE Sensitive     TETRACYCLINE >=16 RESISTANT Resistant     * STAPHYLOCOCCUS SPECIES (COAGULASE NEGATIVE)  MRSA PCR Screening     Status: None   Collection Time: 11/08/14  9:10 PM  Result Value Ref Range Status   MRSA by PCR NEGATIVE NEGATIVE Final    Comment:        The GeneXpert MRSA Assay (FDA approved for NASAL specimens only), is one component of a comprehensive MRSA colonization surveillance program. It is not intended to diagnose MRSA infection nor to guide or monitor treatment for MRSA infections.     Coagulation Studies: No results for input(s): LABPROT, INR in the last 72 hours.  Urinalysis:  Recent Labs  03/22/15 1651  COLORURINE YELLOW  LABSPEC 1.014  PHURINE 5.0  GLUCOSEU NEGATIVE  HGBUR NEGATIVE  BILIRUBINUR NEGATIVE  KETONESUR NEGATIVE  PROTEINUR NEGATIVE  UROBILINOGEN 0.2  NITRITE NEGATIVE  LEUKOCYTESUR NEGATIVE      Imaging: Dg Chest 2 View  03/22/2015   CLINICAL DATA:  64 year old female with weakness, dizziness, hypertension. Initial encounter.  EXAM: CHEST  2 VIEW  COMPARISON:  CT Abdomen and Pelvis 11/08/2014 and earlier.  FINDINGS: Small to moderate right pleural effusion appears increased since November 2015. Stable cardiomegaly and mediastinal contours. Stable left chest cardiac AICD. Right chest tunneled catheter is new since November. No pneumothorax or pulmonary edema. No consolidation. Stable visualized osseous structures. Calcified atherosclerosis of the aorta.  IMPRESSION: Increased right pleural effusion since November 2015, now small to moderate.  No other acute cardiopulmonary abnormality.   Electronically Signed   By: Odessa Fleming M.D.   On: 03/22/2015 15:51     Medications:   . milrinone 0.375 mcg/kg/min (03/22/15 2319)   . calcitRIOL  0.25 mcg Oral Daily  . DULoxetine  60 mg Oral Daily  . gabapentin  200 mg Oral QHS  . heparin  5,000 Units Subcutaneous 3 times per day  . insulin aspart  0-9 Units Subcutaneous  TID WC  . pantoprazole  40 mg Oral Daily  . rOPINIRole  2 mg Oral QHS  . sodium chloride  3 mL Intravenous Q12H  . sodium chloride  3 mL Intravenous Q12H  . zolpidem  5 mg Oral QHS   sodium chloride, HYDROmorphone (DILAUDID) injection, nitroGLYCERIN, ondansetron **OR** ondansetron (ZOFRAN) IV, sodium chloride  Assessment/ Plan:   64 year old female with chronic systolic heart failure on a milrinone drip and mild chronic kidney disease presenting with acute kidney injury and hyperkalemia after contrast study approximately one week ago   1. AKI 2/2 Contrast Nephropathy        1. Creatinine now improved 2. Hyperkalemia w/o EKG changes 1. resolved 3. Chronic systolic HF  on milrionoe gtt 1. Continue to hold lasix  4. HTN  I believe that the acute renal failure has now resolved and renal function will continue to improve slowly. Please call back if further help needed  Elvis Coil [phone]   LOS: 1 Ilhan Debenedetto W  :06 AM

## 2015-03-24 ENCOUNTER — Inpatient Hospital Stay (HOSPITAL_COMMUNITY): Payer: BLUE CROSS/BLUE SHIELD

## 2015-03-24 LAB — BASIC METABOLIC PANEL
ANION GAP: 7 (ref 5–15)
BUN: 55 mg/dL — AB (ref 6–23)
CHLORIDE: 106 mmol/L (ref 96–112)
CO2: 24 mmol/L (ref 19–32)
Calcium: 8.9 mg/dL (ref 8.4–10.5)
Creatinine, Ser: 3.3 mg/dL — ABNORMAL HIGH (ref 0.50–1.10)
GFR calc non Af Amer: 14 mL/min — ABNORMAL LOW (ref >90)
GFR, EST AFRICAN AMERICAN: 16 mL/min — AB (ref >90)
Glucose, Bld: 100 mg/dL — ABNORMAL HIGH (ref 70–99)
POTASSIUM: 3.8 mmol/L (ref 3.5–5.1)
Sodium: 137 mmol/L (ref 135–145)

## 2015-03-24 LAB — CBC
HCT: 26.2 % — ABNORMAL LOW (ref 36.0–46.0)
Hemoglobin: 8.5 g/dL — ABNORMAL LOW (ref 12.0–15.0)
MCH: 19.8 pg — ABNORMAL LOW (ref 26.0–34.0)
MCHC: 32.4 g/dL (ref 30.0–36.0)
MCV: 60.9 fL — ABNORMAL LOW (ref 78.0–100.0)
Platelets: 171 10*3/uL (ref 150–400)
RBC: 4.3 MIL/uL (ref 3.87–5.11)
RDW: 20.8 % — AB (ref 11.5–15.5)
WBC: 3.5 10*3/uL — ABNORMAL LOW (ref 4.0–10.5)

## 2015-03-24 LAB — GLUCOSE, CAPILLARY
GLUCOSE-CAPILLARY: 94 mg/dL (ref 70–99)
GLUCOSE-CAPILLARY: 88 mg/dL (ref 70–99)
Glucose-Capillary: 92 mg/dL (ref 70–99)
Glucose-Capillary: 106 mg/dL — ABNORMAL HIGH (ref 70–99)

## 2015-03-24 LAB — RAPID STREP SCREEN (MED CTR MEBANE ONLY): Streptococcus, Group A Screen (Direct): NEGATIVE

## 2015-03-24 LAB — HEMOGLOBIN A1C
Hgb A1c MFr Bld: 6.4 % — ABNORMAL HIGH (ref 4.8–5.6)
Mean Plasma Glucose: 137 mg/dL

## 2015-03-24 MED ORDER — FUROSEMIDE 40 MG PO TABS
40.0000 mg | ORAL_TABLET | Freq: Every day | ORAL | Status: DC
  Administered 2015-03-24 – 2015-03-26 (×3): 40 mg via ORAL
  Filled 2015-03-24 (×3): qty 1

## 2015-03-24 MED ORDER — DIGOXIN 125 MCG PO TABS
0.1250 mg | ORAL_TABLET | Freq: Every day | ORAL | Status: DC
  Administered 2015-03-24: 0.125 mg via ORAL
  Filled 2015-03-24 (×2): qty 1

## 2015-03-24 NOTE — Consult Note (Signed)
Advanced Heart Failure Team Consult Note  Referring Physician:  Primary Physician: Primary Cardiologist:    Reason for Consultation:   HPI:    Andrea [name] is a 64 y.o. female with a history of CAD DES to prox RCA and stent to dRCA in 2012, chronic systolic heart failure (Echo 8/15 EF 30-35%) ICM s/p ICD placement, cardiac cirrhosis, CVA intolerant aspirin , CKD, GERD, and DM. Admitted with acute on chronic renal failure. We are asked to help manage her HF.   She is not on an ACE inhibitor or ARB due to hyperkalemia in past.   Admitted 03/08/14 with low output HF. Admitting creatinine was 1.3 but increased to 4.49. She was placed on Milrinone and diuresed with IV lasix. Milrinone was weaned off but restarted due to mixed venous saturation 39%. At that point she was discharged on Milrinone at 0.125 mcg via PICC. Discharge creatinine was 2.0. Discharge weight was 176 pounds. Milrinone subsequently titrated to 0.375 mcg/kg/min  Last week she presented to emergency room with nausea, vomiting, diarrhea.  She received a CT of the abdomen and pelvis with contrast. There were no acute findings but some small pleural effusions. She returned home. She had blood work performed Tuesday with reported BUN of 72 and creatinine 6.74. When she was successfully contacted she was encouraged to present to the emergency room. Her creatinine was 4.7 with potassium of 6.7 and a bicarbonate of 19. Her BUN is 63. She was admitted to [facility] and seen by nephrology.   Renal function continues to improve. Weight up 3 pounds.   Review of Systems: [y] = yes, [ ]  = no   General: Weight gain [ ] ; Weight loss [ ] ; Anorexia [ ] ; Fatigue y ]; Fever [ ] ; Chills [ ] ; Weakness Cove.Etienne ]  Cardiac: Chest pain/pressure [ ] ; Resting SOB [ ] ; Exertional SOB [ y]; Orthopnea [ ] ; Pedal Edema [ ] ; Palpitations [ ] ; Syncope [ ] ; Presyncope [ ] ; Paroxysmal nocturnal dyspnea[ ]   Pulmonary: Cough [ ] ; Wheezing[ ] ; Hemoptysis[ ] ; Sputum [ ] ;  Snoring [ ]   GI: Vomiting[ ] ; Dysphagia[ ] ; Melena[ ] ; Hematochezia [ ] ; Heartburn[ ] ; Abdominal pain [ y]; Constipation [ ] ; Diarrhea [ ] ; BRBPR [ ]   GU: Hematuria[ ] ; Dysuria [ ] ; Nocturia[ ]   Vascular: Pain in legs with walking [ ] ; Pain in feet with lying flat [ ] ; Non-healing sores [ ] ; Stroke [ ] ; TIA [ ] ; Slurred speech [ ] ;  Neuro: Headaches[ ] ; Vertigo[ ] ; Seizures[ ] ; Paresthesias[ ] ;Blurred vision [ ] ; Diplopia [ ] ; Vision changes [ ]   Ortho/Skin: Arthritis Cove.Etienne ]; Joint pain [ ] ; Muscle pain [ ] ; Joint swelling [ ] ; Back Pain [ ] ; Rash [ ]   Psych: Depression[ y]; Anxiety[ ]   Heme: Bleeding problems [ ] ; Clotting disorders [ ] ; Anemia [ ]   Endocrine: Diabetes [ ] ; Thyroid dysfunction[ ]   Home Medications Prior to Admission medications   Medication Sig Start Date End Date Taking? Authorizing Provider  acetaminophen (TYLENOL) 500 MG tablet Take 500 mg by mouth every 6 (six) hours as needed for mild pain.   Yes Historical Provider, MD  calcitRIOL (ROCALTROL) 0.25 MCG capsule Take 1 capsule by mouth daily. 01/07/15  Yes Historical Provider, MD  diclofenac sodium (VOLTAREN) 1 % GEL Apply 2 g topically 4 (four) times daily as needed (pain).    Yes Historical Provider, MD  digoxin (LANOXIN) 0.125 MG tablet Take 1 tablet (0.125 mg total) by mouth daily. 02/03/15  Yes  Dolores Patty, MD  DULoxetine (CYMBALTA) 60 MG capsule Take 60 mg by mouth daily.   Yes Historical Provider, MD  furosemide (LASIX) 40 MG tablet Take 1 tablet (40 mg total) by mouth 3 (three) times a week. Mon-Wed-Fri Patient taking differently: Take 40 mg by mouth every Monday, Wednesday, and Friday.  09/13/14  Yes Amy D Clegg, NP  gabapentin (NEURONTIN) 100 MG capsule Take 200 mg by mouth at bedtime.   Yes Historical Provider, MD  magnesium oxide (MAG-OX) 400 (241.3 MG) MG tablet Take 1 tablet (400 mg total) by mouth 3 (three) times daily. 08/09/14  Yes Bevelyn Buckles Bensimhon, MD  ondansetron (ZOFRAN ODT) 8 MG disintegrating tablet  Take 1 tablet (8 mg total) by mouth every 8 (eight) hours as needed for nausea or vomiting. 03/16/15  Yes Marisa Severin, MD  pantoprazole (PROTONIX) 40 MG tablet Take 1 tablet by mouth daily. 01/07/15  Yes Historical Provider, MD  polyethylene glycol (MIRALAX / GLYCOLAX) packet Take 17 g by mouth daily as needed for mild constipation.   Yes Historical Provider, MD  ranitidine (ZANTAC) 300 MG tablet Take 1 tablet by mouth daily. 12/20/14  Yes Historical Provider, MD  rOPINIRole (REQUIP) 2 MG tablet Take 0.5 tablets by mouth at bedtime. 12/20/14  Yes Historical Provider, MD  zolpidem (AMBIEN) 5 MG tablet Take 1 tablet by mouth at bedtime. 12/20/14  Yes Historical Provider, MD  milrinone (PRIMACOR) 20 MG/100ML SOLN infusion Inject 30.225 mcg/min into the vein continuous. Patient not taking: Reported on 03/15/2015 04/12/14   Aundria Rud, NP  nitroGLYCERIN (NITROSTAT) 0.4 MG SL tablet Place 1 tablet (0.4 mg total) under the tongue every 5 (five) minutes as needed for chest pain (MAX 3 TABLETS IN 15 MINUTES). 01/17/15   Marinus Maw, MD    Past Medical History: Past Medical History  Diagnosis Date  . Chronic systolic heart failure   . Hypertension   . Stroke 02/2010    left brain CVA? recurrent TIA's treated with TPA  . Dyslipidemia   . Premature ventricular contractions (PVCs) (VPCs)   . GERD (gastroesophageal reflux disease)   . Coronary artery disease     a. DES-dRCA 11/2010. b. DES-pRCA 04/2011 (in an area free of disease on prior cath). c. s/p DES-mRCA 03/2013 for NSTEMI.  . Ischemic cardiomyopathy     a. Hx of medtronic ICD. b. EF 15-20% by cath 03/2013.  . Pulmonary hypertension   . Gout   . Obesity   . Thrombocytopenia   . Depression   . Hypotension     a. Admission 09/2012 for hypotn & ARF. b. Meds held 03/2013 due to hypotension.  . Acute renal insufficiency     a. 09/2012. b. Also noted post-cath 03/2013.  . NSTEMI (non-ST elevated myocardial infarction) 03/2013  . Anginal pain   .  Asthma   . Microcytic anemia     a. Noted 03/2013 on labs, iron studies WNL.  Marland Kitchen Anxiety   . CHF (congestive heart failure)   . CKD (chronic kidney disease) stage 3, GFR 30-59 ml/min   . HTN (hypertension)   . Primary pulmonary HTN   . Automatic implantable cardiac defibrillator - Medtronic 08/24/2013  . Heart murmur   . Type II diabetes mellitus   . Migraine headache     "no pain; aura/visual problems only; at least 2-3X/month" (01/18/2014)  . Arthritis     "knees" (01/18/2014)  . DJD (degenerative joint disease)   . Bipolar disorder  Past Surgical History: Past Surgical History  Procedure Laterality Date  . Mass excision Left     hip  . Cervical polypectomy  1970's  . US echocardiography  2011  . Tonsillectomy  1960's?  . Right oophorectomy  1980's  . Dilation and curettage of uterus  1970's  . Ostectomy metatarsal Left 1993    "5th toe; from rickets" (05/07/2013)  . Implantable cardioverter defibrillator implant  ~ 2008  . Coronary angioplasty with stent placement  2000's    "2 stents" (05/07/2013)  . Coronary angioplasty with stent placement  03/2013    PCI to RCA/notes 05/05/2013; "makes total of 3" (05/07/2013)  . Left heart catheterization with coronary angiogram N/A 04/27/2013    Procedure: LEFT HEART CATHETERIZATION WITH CORONARY ANGIOGRAM;  Surgeon: Kathleene Hazel, MD;  Location: [facility] CATH LAB;  Service: Cardiovascular;  Laterality: N/A;  . Percutaneous coronary stent intervention (pci-s)  04/27/2013    Procedure: PERCUTANEOUS CORONARY STENT INTERVENTION (PCI-S);  Surgeon: Kathleene Hazel, MD;  Location: [facility] CATH LAB;  Service: Cardiovascular;;  Mid RCA   . Left and right heart catheterization with coronary angiogram N/A 04/12/2014    Procedure: LEFT AND RIGHT HEART CATHETERIZATION WITH CORONARY ANGIOGRAM;  Surgeon: Dolores Patty, MD;  Location: [facility] CATH LAB;  Service: Cardiovascular;  Laterality: N/A;    Family History: Family History  Problem Relation Age  of Onset  . Heart failure Mother   . Heart attack Father   . Heart disease Brother     Social History: History   Social History  . Marital Status: Single    Spouse Name: N/A  . Number of Children: 1  . Years of Education: N/A   Occupational History  .     Social History Main Topics  . Smoking status: Former Smoker -- 0.05 packs/day for 5 years    Types: Cigarettes    Quit date: 04/23/1982  . Smokeless tobacco: Never Used  . Alcohol Use: No  . Drug Use: No  . Sexual Activity: Yes   Other Topics Concern  . None   Social History Narrative   ** Merged History Encounter **        Allergies:  Allergies  Allergen Reactions  . Aspirin Shortness Of Breath and Other (See Comments)    Asthma symptoms  . Brilinta [Ticagrelor] Shortness Of Breath and Other (See Comments)    Gout   . Percocet [Oxycodone-Acetaminophen] Nausea And Vomiting    Patient can tolerate acetaminophen solely  . Darvon Nausea And Vomiting  . Diovan [Valsartan] Other (See Comments) and Nausea Only    unknown  . Imitrex [Sumatriptan Base] Other (See Comments)    Unknown reaction  . Licorice Flavor [Artificial Licorice Flavor]     Black licorice induces asthma   . Nsaids Nausea And Vomiting and Other (See Comments)    GI upset   . Other Other (See Comments)    Black Licorice  - causes asthma attach    Objective:    Vital Signs:   Temp:  [97.8 F (36.6 C)-98.4 F (36.9 C)] 98.4 F (36.9 C) (03/24 1250) Pulse Rate:  [80-93] 93 (03/24 1250) Resp:  [18-20] 18 (03/24 1250) BP: (122-149)/(59-75) 149/75 mmHg (03/24 1250) SpO2:  [96 %-99 %] 96 % (03/24 1250) Weight:  [179 lb 12.8 oz (81.557 kg)] 179 lb 12.8 oz (81.557 kg) (03/24 0645) Last BM Date: 03/23/15  Weight change: Filed Weights   03/22/15 2050 03/23/15 0500 03/24/15 0645  Weight: 176 lb 3.2  oz (79.924 kg) 176 lb 4.8 oz (79.969 kg) 179 lb 12.8 oz (81.557 kg)    Intake/Output:   Intake/Output Summary (Last 24 hours) at 03/24/15  1701 Last data filed at 03/24/15 1400  Gross per 24 hour  Intake 1518.62 ml  Output   1051 ml  Net 467.62 ml     Physical Exam: General: NAD  HEENT: normal Neck: supple. JVP 9 . Carotids 2+ bilaterally; no bruits. No lymphadenopathy or thryomegaly appreciated. Cor: PMI normal. Regular tachy. No rubs, or murmurs.  Lungs: clear Abdomen: soft, nontender, nondistended. No hepatosplenomegaly. No bruits or masses. Good bowel sounds. Extremities: no cyanosis, clubbing, rash, edema, RUE 2 lumen PICC - no drainage Neuro: alert & orientedx3, cranial nerves grossly intact. Moves all 4 extremities w/o difficulty. Affect pleasant  Telemetry: SR 80s  Labs: Basic Metabolic Panel:  Recent Labs Lab 03/22/15 1601 03/23/15 0505 03/24/15 0530  NA 136 137 137  K 6.7* 4.1  4.0 3.8  CL 107 105 106  CO2 GLUCOSE 79 105* 100*  BUN 63* 58* 55*  CREATININE 4.70* 4.15* 3.30*  CALCIUM 8.8 8.8 8.9    Liver Function Tests:  Recent Labs Lab 03/22/15 1601  AST 97*  ALT 30  ALKPHOS 88  BILITOT 1.6*  PROT 7.4  ALBUMIN 3.0*   No results for input(s): LIPASE, AMYLASE in the last 168 hours. No results for input(s): AMMONIA in the last 168 hours.  CBC:  Recent Labs Lab 03/22/15 1601 03/23/15 0505 03/24/15 0530  WBC 3.8* 3.2* 3.5*  NEUTROABS 2.4  --   --   HGB 9.7* 8.8* 8.5*  HCT 29.3* 27.2* 26.2*  MCV 60.4* 60.6* 60.9*  PLT 235 196 171    Cardiac Enzymes:  Recent Labs Lab 03/22/15 1601 03/23/15 0505  TROPONINI 0.12* 0.13*    BNP: BNP (last 3 results)  Recent Labs  03/15/15 2150 03/22/15 1601  BNP 701.4* 930.9*    ProBNP (last 3 results)  Recent Labs  11/08/14 1800  PROBNP 2681.0*     CBG:  Recent Labs Lab 03/23/15 1609 03/23/15 2139 03/24/15 0649 03/24/15 1117 03/24/15 1649  GLUCAP 121* 82 94 92 106*    Coagulation Studies: No results for input(s): LABPROT, INR in the last 72 hours.  Other results: EKG:SR 82 iLBBB  Imaging: Dg  Chest Port 1 View  03/24/2015   CLINICAL DATA:  Rales on chest exam  EXAM: PORTABLE CHEST - 1 VIEW  COMPARISON:  PA and lateral chest of March 22, 2015  FINDINGS: The lungs are less well inflated today. The pulmonary interstitial markings are more prominent especially in the right lung. The right hemidiaphragm remains obscured with a right pleural effusion suspected. The left hemidiaphragm is sharp. The cardiac silhouette remains enlarged. The central pulmonary vascularity is engorged and less distinct today. The AICD is unchanged in position. The right internal jugular venous catheter tip projects over the proximal portion of the SVC.  IMPRESSION: Findings worrisome for cardiac decompensation with pulmonary edema and small right pleural effusion. If the patient can tolerate the procedure, a PA and lateral chest x-ray with deep inspiration would be useful.   Electronically Signed   By: David  [place]   On: 03/24/2015 12:26      Medications:     Current Medications: . calcitRIOL  0.25 mcg Oral Daily  . digoxin  0.125 mg Oral Daily  . DULoxetine  60 mg Oral Daily  . ferrous sulfate  325 mg Oral TID WC  .  furosemide  40 mg Oral Daily  . gabapentin  200 mg Oral QHS  . heparin  5,000 Units Subcutaneous 3 times per day  . insulin aspart  0-9 Units Subcutaneous TID WC  . pantoprazole  40 mg Oral Daily  . rOPINIRole  2 mg Oral QHS  . sodium chloride  3 mL Intravenous Q12H  . sodium chloride  3 mL Intravenous Q12H  . zolpidem  5 mg Oral QHS     Infusions: . milrinone 0.375 mcg/kg/min (03/24/15 1048)      Assessment:   1. Chronic Systolic HF, ICM s/p ICD; EF ~ 30-35% on milrinone 0.375 mcg (07/2014) 2. Acute on chronic renal failure  - Suspect probable contrast nephropathy. Now recovering 3. CAD S/P PCI with DES to prox RCA and stent to distal RCA in 2012. No s/s of ischemia.  4. History of liver failure and cirrhosis. Treated at [facility]---> thought to be from cardiomyopathy 5. PSVT -  continue amio 200 daily.  6. Microcytic anemia   Plan/Discussion:     Suspect she likely had contrast nephropathy. HF seems very stable. Agree that fluid is slightly elevated. Lasix has been restarted. Urine output is good. Would continue current regimen. Check co-ox in am. We will follow.   Check iron stores. May benefit from feraheme.     Length of Stay: 2  [name] MD 03/24/2015, 5:01 PM  Advanced Heart Failure Team Pager [phone] (M-F; 7a - 4p)  Please contact [facility] for night-coverage after hours (4p -7a ) and weekends on [email or web address]

## 2015-03-24 NOTE — Progress Notes (Signed)
Pharmacist Heart Failure Core Measure Documentation  Assessment: Andrea [name] has an EF documented as 30-35% on 08/09/14 by ECHO.  Rationale: Heart failure patients with left ventricular systolic dysfunction (LVSD) and an EF < 40% should be prescribed an angiotensin converting enzyme inhibitor (ACEI) or angiotensin receptor blocker (ARB) at discharge unless a contraindication is documented in the medical record.  This patient is not currently on an ACEI or ARB for HF.  This note is being placed in the record in order to provide documentation that a contraindication to the use of these agents is present for this encounter.  ACE Inhibitor or Angiotensin Receptor Blocker is contraindicated (specify all that apply)  []   ACEI allergy AND ARB allergy []   Angioedema []   Moderate or severe aortic stenosis []   Hyperkalemia []   Hypotension []   Renal artery stenosis [x]   Worsening renal function, preexisting renal disease or dysfunction   [name] 03/24/2015 3:01 PM

## 2015-03-24 NOTE — Evaluation (Signed)
Physical Therapy Evaluation Patient Details Name: Andrea [name] MRN: [id number] DOB: 1951/11/11 Today's Date: 03/24/2015   History of Present Illness  Patient is a 64 yo female dmitted with AKI.  Clinical Impression  Patient demonstrates deficits in functional mobility as indicated below. Will need continued skilled PT to address deficits and maximize function. Will see as indicated and progress as tolerated. At this time, anticipate patient will progress well with mobility, however, if patient becomes deconditioned and does not progress, may need to consider HHPT.     Follow Up Recommendations Supervision - Intermittent (if patient does not progress, may consider HHPT)    Equipment Recommendations  None recommended by PT    Recommendations for Other Services       Precautions / Restrictions Precautions Precautions: Fall      Mobility  Bed Mobility Overal bed mobility: Needs Assistance Bed Mobility: Supine to Sit     Supine to sit: Supervision     General bed mobility comments: HOB elevated, use of rail to come to EOB, no physical assist needed  Transfers Overall transfer level: Needs assistance Equipment used: Rolling walker (2 wheeled) Transfers: Sit to/from Stand Sit to Stand: Supervision         General transfer comment: initial instability when standing without device, improved with use of RW  Ambulation/Gait Ambulation/Gait assistance: Supervision Ambulation Distance (Feet): 90 Feet Assistive device: Rolling walker (2 wheeled) Gait Pattern/deviations: Step-through pattern;Decreased stride length;Drifts right/left;Trunk flexed Gait velocity: decreased Gait velocity interpretation: Below normal speed for age/gender General Gait Details: patient with modest instability noted, encouraged to continue ambulation with device at this time  Stairs            Wheelchair Mobility    Modified Rankin (Stroke Patients Only)       Balance Overall  balance assessment: Needs assistance Sitting-balance support: Feet supported Sitting balance-Leahy Scale: Good     Standing balance support: During functional activity Standing balance-Leahy Scale: Fair Standing balance comment: increased lateral sway, needs support in static standing                             Pertinent Vitals/Pain Pain Assessment: No/denies pain    Home Living Family/patient expects to be discharged to:: Private residence Living Arrangements: Spouse/significant other Available Help at Discharge: Family Type of Home: House Home Access: Stairs to enter Entrance Stairs-Rails: None Entrance Stairs-Number of Steps: 1 Home Layout: One level Home Equipment: Environmental consultant - 2 wheels;Walker - 4 wheels;Cane - single point      Prior Function Level of Independence: Independent with assistive device(s)         Comments: uses AD in community, furniture surfs in house     Hand Dominance   Dominant Hand: Right    Extremity/Trunk Assessment               Lower Extremity Assessment: Generalized weakness (modest edema noted, BLEs)         Communication   Communication: No difficulties  Cognition Arousal/Alertness: Awake/alert Behavior During Therapy: Flat affect Overall Cognitive Status: Within Functional Limits for tasks assessed                      General Comments      Exercises        Assessment/Plan    PT Assessment Patient needs continued PT services  PT Diagnosis Difficulty walking;Abnormality of gait;Generalized weakness   PT Problem List Decreased strength;Decreased  activity tolerance;Decreased balance;Decreased mobility  PT Treatment Interventions DME instruction;Gait training;Functional mobility training;Therapeutic activities;Therapeutic exercise;Balance training;Patient/family education   PT Goals (Current goals can be found in the Care Plan section) Acute Rehab PT Goals Patient Stated Goal: to go home PT Goal  Formulation: With patient Time For Goal Achievement: 04/07/15 Potential to Achieve Goals: Good    Frequency Min 3X/week   Barriers to discharge        Co-evaluation               End of Session Equipment Utilized During Treatment: Gait belt Activity Tolerance: Patient limited by fatigue Patient left: in bed;with call bell/phone within reach;with nursing/sitter in room;with family/visitor present (sitting EOB) Nurse Communication: Mobility status         Time: 1011-1030 PT Time Calculation (min) (ACUTE ONLY): 19 min   Charges:   PT Evaluation $Initial PT Evaluation Tier I: 1 Procedure     PT G CodesFabio Asa 04/01/2015, 11:12 AM Charlotte Crumb, PT DPT  [phone]

## 2015-03-24 NOTE — Progress Notes (Addendum)
[facility] PROGRESS NOTE  Andrea [name] IRW:[id number] DOB: Mar 15, 1951 DOA: 03/22/2015 PCP: Andrea Patella, MD  Brief Summary  The patient is a 64 year old female with history of chronic systolic heart failure with ejection fraction of 15% on continuous milrinone infusion, hypertension, type 2 diabetes mellitus, anemia of chronic disease, chronic kidney disease stage III, who was advised to come to the emergency department secondary to abnormal labs.  One week prior to presentation she had nausea, vomiting, and diarrhea which have since resolved.  She likely became relatively dehydrated and in the ER, her potassium was 6.7 and her creatinine was up to 4.7 with a baseline of 1.1.  Her diuretics were held and her kidney function has gradually improved.  She now has worsening rales on exam.    Assessment/Plan  Acute kidney injury, likely due to ATN from dehydration, uop increasing and creatinine trending down, peak 4.7 on 3/22 -  Appreciate nephrology assistance -  Resume lasix and digoxin -  Renally dose medications  -  Okay to continue milrinone as long as blood pressure stable  NYHA class III/IV ischemic systolic heart failure with EF 15% on home milrinone infusion, increased rales on exam -  Resume Lasix and digoxin -  Repeat CXR:  Worsening pulmonary edema/decompensation -  continue milrinone, BP stable -  Daily weights:  80 -- 80 -- 81.5 -  Strict I/O:  +481ml yesterday -  Heart failure team consultation to assist with diuretic/milrinone management   Sore throat, likely viral or allergic, however, patient worried about strep throat.  Afebrile, no lymphadenopathy -  Rapid strep test:  neg  Hyperkalemia -  Resolved with kayexelate  Sinus congestion and headache.  DDx includes URI v. Allergies -  Offered flonase and afrin, which she declined -  Continue claritin -  D/c IV dilaudid and start tylenol prn  Leukopenia, chronic and stable  Iron deficiency  anemia -  Start iron supplementation -  TSH, B12, folate all wnl within last 6 months  Stable, but mildly elevated troponin likely secondary to AKI in setting of severe heart failure   Bipolar disorder, chronic, stable -  contiue cymbalta  T2DM with low normal CBG,  -  A1c 6.4 on 03/23/2015 -  Continue low dose ssi  GERD, stable -  Continue protonix and ranitidine  Diet:  Diabetic, low sodium Access:  PICC line right chest IVF:  off Proph:  heparin  Code Status: full Family Communication:  Patient alone Disposition Plan:  Concern for increased rales after holding diuretics.     Consultants:  Nephrology  Heart failure service  Procedures:  CXR  Antibiotics:  none   HPI/Subjective:  Denies SOB, weight loss or gain, increased or decreased LEE.  Feeling tired/fatigued.  Had headache that was better after IV dilaudid  Objective: Filed Vitals:   03/23/15 2118 03/24/15 0109 03/24/15 0645 03/24/15 0818  BP: 131/67 130/59 122/61 131/66  Pulse: 83 88 80 85  Temp: 97.9 F (36.6 C) 97.9 F (36.6 C) 98 F (36.7 C) 97.8 F (36.6 C)  TempSrc: Oral Oral Oral Oral  Resp: 20 18 18 20   Height:      Weight:   81.557 kg (179 lb 12.8 oz)   SpO2: 99% 98% 97% 99%    Intake/Output Summary (Last 24 hours) at 03/24/15 0955 Last data filed at 03/24/15 0913  Gross per 24 hour  Intake 1462.22 ml  Output    500 ml  Net 962.22 ml   [employer]  03/22/15 2050 03/23/15 0500 03/24/15 0645  Weight: 79.924 kg (176 lb 3.2 oz) 79.969 kg (176 lb 4.8 oz) 81.557 kg (179 lb 12.8 oz)    Exam:   General:  Adult female, No acute distress  HEENT:  NCAT, MMM, + JVP (?TVR).  Sounds nasal, but sinuses appear clear  Cardiovascular:  RRR, nl S1, S2, 2/6 systolic murmur, 2+ pulses, warm extremities  Respiratory:  Rales at bilateral bases, no wheezes or rhonchi, no increased WOB  Abdomen:   NABS, soft, NT/ND  MSK:   Normal tone and bulk, trace nonpitting edema of ankles  Neuro:   Grossly intact  Data Reviewed: Basic Metabolic Panel:  Recent Labs Lab 03/22/15 1601 03/23/15 0505 03/24/15 0530  NA 136 137 137  K 6.7* 4.1  4.0 3.8  CL 107 105 106  CO2 GLUCOSE 79 105* 100*  BUN 63* 58* 55*  CREATININE 4.70* 4.15* 3.30*  CALCIUM 8.8 8.8 8.9   Liver Function Tests:  Recent Labs Lab 03/22/15 1601  AST 97*  ALT 30  ALKPHOS 88  BILITOT 1.6*  PROT 7.4  ALBUMIN 3.0*   No results for input(s): LIPASE, AMYLASE in the last 168 hours. No results for input(s): AMMONIA in the last 168 hours. CBC:  Recent Labs Lab 03/22/15 1601 03/23/15 0505 03/24/15 0530  WBC 3.8* 3.2* 3.5*  NEUTROABS 2.4  --   --   HGB 9.7* 8.8* 8.5*  HCT 29.3* 27.2* 26.2*  MCV 60.4* 60.6* 60.9*  PLT 235 196 171   Cardiac Enzymes:  Recent Labs Lab 03/22/15 1601 03/23/15 0505  TROPONINI 0.12* 0.13*   BNP (last 3 results)  Recent Labs  03/15/15 2150 03/22/15 1601  BNP 701.4* 930.9*    ProBNP (last 3 results)  Recent Labs  11/08/14 1800  PROBNP 2681.0*    CBG:  Recent Labs Lab 03/23/15 0611 03/23/15 1147 03/23/15 1609 03/23/15 2139 03/24/15 0649  GLUCAP 121* 109* 121* 82 94    No results found for this or any previous visit (from the past 240 hour(s)).   Studies: Dg Chest 2 View  03/22/2015   CLINICAL DATA:  64 year old female with weakness, dizziness, hypertension. Initial encounter.  EXAM: CHEST  2 VIEW  COMPARISON:  CT Abdomen and Pelvis 11/08/2014 and earlier.  FINDINGS: Small to moderate right pleural effusion appears increased since November 2015. Stable cardiomegaly and mediastinal contours. Stable left chest cardiac AICD. Right chest tunneled catheter is new since November. No pneumothorax or pulmonary edema. No consolidation. Stable visualized osseous structures. Calcified atherosclerosis of the aorta.  IMPRESSION: Increased right pleural effusion since November 2015, now small to moderate.  No other acute cardiopulmonary abnormality.    Electronically Signed   By: Odessa Fleming M.D.   On: 03/22/2015 15:51    Scheduled Meds: . calcitRIOL  0.25 mcg Oral Daily  . DULoxetine  60 mg Oral Daily  . ferrous sulfate  325 mg Oral TID WC  . furosemide  40 mg Oral Daily  . gabapentin  200 mg Oral QHS  . heparin  5,000 Units Subcutaneous 3 times per day  . insulin aspart  0-9 Units Subcutaneous TID WC  . pantoprazole  40 mg Oral Daily  . rOPINIRole  2 mg Oral QHS  . sodium chloride  3 mL Intravenous Q12H  . sodium chloride  3 mL Intravenous Q12H  . zolpidem  5 mg Oral QHS   Continuous Infusions: . milrinone 0.375 mcg/kg/min (03/24/15 0051)    Principal Problem:  AKI (acute kidney injury) Active Problems:   Chronic systolic congestive heart failure, NYHA class 4-EF 15% per ECHO 2011   Hypertension   Diabetes mellitus type 2, controlled   CKD (chronic kidney disease)   Hyperkalemia    Time spent: 30 min    Andrea [name], [facility]  [facility] Pager [phone]. If 7PM-7AM, please contact night-coverage at [email or web address], password [facility] 03/24/2015, 9:55 AM  LOS: 2 days

## 2015-03-25 LAB — CARBOXYHEMOGLOBIN
Carboxyhemoglobin: 1.2 % (ref 0.5–1.5)
METHEMOGLOBIN: 0.7 % (ref 0.0–1.5)
O2 Saturation: 92.5 %
TOTAL HEMOGLOBIN: 9.4 g/dL — AB (ref 12.0–16.0)

## 2015-03-25 LAB — BASIC METABOLIC PANEL
Anion gap: 4 — ABNORMAL LOW (ref 5–15)
BUN: 43 mg/dL — ABNORMAL HIGH (ref 6–23)
CALCIUM: 8.9 mg/dL (ref 8.4–10.5)
CHLORIDE: 107 mmol/L (ref 96–112)
CO2: 25 mmol/L (ref 19–32)
CREATININE: 3.02 mg/dL — AB (ref 0.50–1.10)
GFR calc non Af Amer: 15 mL/min — ABNORMAL LOW (ref >90)
GFR, EST AFRICAN AMERICAN: 18 mL/min — AB (ref >90)
Glucose, Bld: 88 mg/dL (ref 70–99)
POTASSIUM: 3.9 mmol/L (ref 3.5–5.1)
Sodium: 136 mmol/L (ref 135–145)

## 2015-03-25 LAB — GLUCOSE, CAPILLARY
GLUCOSE-CAPILLARY: 78 mg/dL (ref 70–99)
Glucose-Capillary: 110 mg/dL — ABNORMAL HIGH (ref 70–99)
Glucose-Capillary: 146 mg/dL — ABNORMAL HIGH (ref 70–99)

## 2015-03-25 LAB — FOLATE: FOLATE: 8.4 ng/mL

## 2015-03-25 LAB — IRON AND TIBC
IRON: 56 ug/dL (ref 42–145)
SATURATION RATIOS: 16 % — AB (ref 20–55)
TIBC: 342 ug/dL (ref 250–470)
UIBC: 286 ug/dL (ref 125–400)

## 2015-03-25 LAB — VITAMIN B12: VITAMIN B 12: 779 pg/mL (ref 211–911)

## 2015-03-25 LAB — RETICULOCYTES
RBC.: 4.68 MIL/uL (ref 3.87–5.11)
Retic Count, Absolute: 98.3 10*3/uL (ref 19.0–186.0)
Retic Ct Pct: 2.1 % (ref 0.4–3.1)

## 2015-03-25 LAB — FERRITIN: Ferritin: 34 ng/mL (ref 10–291)

## 2015-03-25 LAB — MAGNESIUM: Magnesium: 1.7 mg/dL (ref 1.5–2.5)

## 2015-03-25 MED ORDER — HYDRALAZINE HCL 25 MG PO TABS
25.0000 mg | ORAL_TABLET | Freq: Three times a day (TID) | ORAL | Status: DC
  Administered 2015-03-25 – 2015-03-26 (×4): 25 mg via ORAL
  Filled 2015-03-25 (×7): qty 1

## 2015-03-25 MED ORDER — MAGNESIUM SULFATE 4 GM/100ML IV SOLN
4.0000 g | Freq: Once | INTRAVENOUS | Status: AC
  Administered 2015-03-25: 4 g via INTRAVENOUS
  Filled 2015-03-25: qty 100

## 2015-03-25 NOTE — Progress Notes (Addendum)
Placed order for IV Consult team so that they may draw carboxyhemoglobin from PICC.

## 2015-03-25 NOTE — Progress Notes (Signed)
[facility] PROGRESS NOTE  Andrea [name] HUT:[id number] DOB: 1951-02-04 DOA: 03/22/2015 PCP: Lolita Patella, MD  Brief Summary  The patient is a 64 year old female with history of chronic systolic heart failure with ejection fraction of 15% on continuous milrinone infusion, hypertension, type 2 diabetes mellitus, anemia of chronic disease, chronic kidney disease stage III, who was advised to come to the emergency department secondary to abnormal labs.  One week prior to presentation she had nausea, vomiting, and diarrhea which have since resolved.  She likely became relatively dehydrated and in the ER, her potassium was 6.7 and her creatinine was up to 4.7 with a baseline of 1.1.  Her diuretics were held and her kidney function has gradually improved.  She developed worsening rales on 3/24 which are gradually improving after restarting diuretics.    Assessment/Plan  Acute kidney injury, likely due to ATN from dehydration, uop increasing and creatinine trending down, peak 4.7 on 3/22 -  Appreciate nephrology assistance -  Continue lasix and digoxin -  Renally dose medications  -  Okay to continue milrinone as long as blood pressure stable  NYHA class III/IV ischemic systolic heart failure with EF 15% on home milrinone infusion, persistent rales on exam -  Continue Lasix  -  Repeat CXR 3/25:  Worsening pulmonary edema/decompensation -  continue milrinone, BP stable -  Daily weights:  80 -- 80 -- 81.5 -- 81.6 -  Strict I/O:  -172 ml yesterday -  Hydralazine added >> monitor blood pressure -  Appreciate Heart failure team consultation to assist with diuretic/milrinone management   Sore throat, likely viral or allergic, however, patient worried about strep throat.  Afebrile, no lymphadenopathy -  Rapid strep test:  neg  Hyperkalemia -  Resolved with kayexelate  Sinus congestion and headache.  DDx includes URI v. Allergies -  Offered flonase and afrin, which she  declined -  Continue claritin - continue tylenol prn  Leukopenia, chronic and stable  Iron deficiency anemia -  Continue iron supplementation -  Folate, B12, and iron studies pending  Stable, but mildly elevated troponin likely secondary to AKI in setting of severe heart failure   Bipolar disorder, chronic, stable -  contiue cymbalta  T2DM with low normal CBG,  -  A1c 6.4 on 03/23/2015 -  D/c ssi  GERD, stable -  Continue protonix and ranitidine  Hypomagnesemia -  IV magnesium  Diet:  Low sodium Access:  PICC line right chest IVF:  off Proph:  heparin  Code Status: full Family Communication:  Patient alone Disposition Plan:  Possibly home tomorrow if rales improved and BP tolerating hydralazine   Consultants:  Nephrology  Heart failure service  Procedures:  CXR  Antibiotics:  none   HPI/Subjective:  SOB improved but feels fatigued and weak.   Objective: Filed Vitals:   03/24/15 1734 03/24/15 2204 03/25/15 0655 03/25/15 1100  BP: 125/61 137/69 140/62 111/54  Pulse: 85 82 81 81  Temp: 97.4 F (36.3 C) 98 F (36.7 C) 97.5 F (36.4 C)   TempSrc: Oral Oral Oral   Resp: 18 18 17    Height:      Weight:   81.647 kg (180 lb)   SpO2: 95% 97% 96%     Intake/Output Summary (Last 24 hours) at 03/25/15 1251 Last data filed at 03/25/15 1207  Gross per 24 hour  Intake   1022 ml  Output   1350 ml  Net   -328 ml   Filed Weights   03/23/15  0500 03/24/15 0645 03/25/15 0655  Weight: 79.969 kg (176 lb 4.8 oz) 81.557 kg (179 lb 12.8 oz) 81.647 kg (180 lb)    Exam:   General:  Adult female, No acute distress  HEENT:  NCAT, MMM  Cardiovascular:  RRR, nl S1, S2, 2/6 systolic murmur, 2+ pulses, warm extremities  Respiratory:  Persistent but improved rales at bilateral bases, no wheezes or rhonchi, no increased WOB  Abdomen:   NABS, soft, NT/ND  MSK:   Normal tone and bulk, trace nonpitting edema of ankles  Neuro:  Grossly intact  Data  Reviewed: Basic Metabolic Panel:  Recent Labs Lab 03/22/15 1601 03/23/15 0505 03/24/15 0530 03/25/15 0520  NA 136 137 137 136  K 6.7* 4.1  4.0 3.8 3.9  CL 107 105 106 107  CO2 GLUCOSE 79 105* 100* 88  BUN 63* 58* 55* 43*  CREATININE 4.70* 4.15* 3.30* 3.02*  CALCIUM 8.8 8.8 8.9 8.9  MG  --   --   --  1.7   Liver Function Tests:  Recent Labs Lab 03/22/15 1601  AST 97*  ALT 30  ALKPHOS 88  BILITOT 1.6*  PROT 7.4  ALBUMIN 3.0*   No results for input(s): LIPASE, AMYLASE in the last 168 hours. No results for input(s): AMMONIA in the last 168 hours. CBC:  Recent Labs Lab 03/22/15 1601 03/23/15 0505 03/24/15 0530  WBC 3.8* 3.2* 3.5*  NEUTROABS 2.4  --   --   HGB 9.7* 8.8* 8.5*  HCT 29.3* 27.2* 26.2*  MCV 60.4* 60.6* 60.9*  PLT 235 196 171   Cardiac Enzymes:  Recent Labs Lab 03/22/15 1601 03/23/15 0505  TROPONINI 0.12* 0.13*   BNP (last 3 results)  Recent Labs  03/15/15 2150 03/22/15 1601  BNP 701.4* 930.9*    ProBNP (last 3 results)  Recent Labs  11/08/14 1800  PROBNP 2681.0*    CBG:  Recent Labs Lab 03/24/15 0649 03/24/15 1117 03/24/15 1649 03/24/15 2201 03/25/15 0558  GLUCAP 94 92 106* 88 78    Recent Results (from the past 240 hour(s))  Rapid strep screen     Status: None   Collection Time: 03/24/15  1:48 PM  Result Value Ref Range Status   Streptococcus, Group A Screen (Direct) NEGATIVE NEGATIVE Final    Comment: (NOTE) A Rapid Antigen test may result negative if the antigen level in the sample is below the detection level of this test. The FDA has not cleared this test as a stand-alone test therefore the rapid antigen negative result has reflexed to a Group A Strep culture.      Studies: Dg Chest Port 1 View  03/24/2015   CLINICAL DATA:  Rales on chest exam  EXAM: PORTABLE CHEST - 1 VIEW  COMPARISON:  PA and lateral chest of March 22, 2015  FINDINGS: The lungs are less well inflated today. The pulmonary  interstitial markings are more prominent especially in the right lung. The right hemidiaphragm remains obscured with a right pleural effusion suspected. The left hemidiaphragm is sharp. The cardiac silhouette remains enlarged. The central pulmonary vascularity is engorged and less distinct today. The AICD is unchanged in position. The right internal jugular venous catheter tip projects over the proximal portion of the SVC.  IMPRESSION: Findings worrisome for cardiac decompensation with pulmonary edema and small right pleural effusion. If the patient can tolerate the procedure, a PA and lateral chest x-ray with deep inspiration would be useful.   Electronically Signed  By: David  [place]   On: 03/24/2015 12:26    Scheduled Meds: . calcitRIOL  0.25 mcg Oral Daily  . DULoxetine  60 mg Oral Daily  . ferrous sulfate  325 mg Oral TID WC  . furosemide  40 mg Oral Daily  . gabapentin  200 mg Oral QHS  . heparin  5,000 Units Subcutaneous 3 times per day  . hydrALAZINE  25 mg Oral 3 times per day  . insulin aspart  0-9 Units Subcutaneous TID WC  . magnesium sulfate 1 - 4 g bolus IVPB  4 g Intravenous Once  . pantoprazole  40 mg Oral Daily  . rOPINIRole  2 mg Oral QHS  . sodium chloride  3 mL Intravenous Q12H  . sodium chloride  3 mL Intravenous Q12H  . zolpidem  5 mg Oral QHS   Continuous Infusions: . milrinone 0.375 mcg/kg/min (03/25/15 0830)    Principal Problem:   AKI (acute kidney injury) Active Problems:   Chronic systolic congestive heart failure, NYHA class 4-EF 15% per ECHO 2011   Hypertension   Diabetes mellitus type 2, controlled   CKD (chronic kidney disease)   Hyperkalemia    Time spent: 30 min    Cadin Luka  [facility] Pager [phone]. If 7PM-7AM, please contact night-coverage at [email or web address], password [facility] 03/25/2015, 12:51 PM  LOS: 3 days

## 2015-03-25 NOTE — Progress Notes (Signed)
Physical Therapy Treatment Patient Details Name: Andrea [name] MRN: [id number] DOB: Feb 04, 1951 Today's Date: 03/25/2015    History of Present Illness Patient is a 64 yo female admitted with AKI.    PT Comments    Patient with good progress this session compared to previous session. Patient more alert and interactive this session. Patient mobilizing well, increased distance tolerated this session. Patient also able to perform various self care tasks without assist. Will continue to see and progress as tolerated.   Follow Up Recommendations  Supervision - Intermittent (if patient does not progress, may consider HHPT)     Equipment Recommendations  None recommended by PT    Recommendations for Other Services       Precautions / Restrictions Precautions Precautions: Fall    Mobility  Bed Mobility Overal bed mobility: Needs Assistance Bed Mobility: Supine to Sit     Supine to sit: Supervision     General bed mobility comments: no assist needed, increased time to perform  Transfers Overall transfer level: Needs assistance Equipment used: Rolling walker (2 wheeled) Transfers: Sit to/from Stand Sit to Stand: Supervision         General transfer comment: able to stand from various surfaces  Ambulation/Gait Ambulation/Gait assistance: Supervision Ambulation Distance (Feet): 310 Feet Assistive device: Rolling walker (2 wheeled) (amb in room without device) Gait Pattern/deviations: Step-through pattern;Decreased stride length;Drifts right/left;Trunk flexed Gait velocity: decreased Gait velocity interpretation: Below normal speed for age/gender General Gait Details: one standing rest break, mobilizing well, able to ambulate in room without device   Stairs            Wheelchair Mobility    Modified Rankin (Stroke Patients Only)       Balance Overall balance assessment: No apparent balance deficits (not formally assessed) Sitting-balance support: Feet  supported Sitting balance-Leahy Scale: Good     Standing balance support: During functional activity Standing balance-Leahy Scale: Fair                      Cognition Arousal/Alertness: Awake/alert Behavior During Therapy: Flat affect Overall Cognitive Status: Within Functional Limits for tasks assessed                      Exercises      General Comments General comments (skin integrity, edema, etc.): educated regarding energy conservation techniques      Pertinent Vitals/Pain Pain Assessment: No/denies pain    Home Living                      Prior Function            PT Goals (current goals can now be found in the care plan section) Acute Rehab PT Goals Patient Stated Goal: to go home PT Goal Formulation: With patient Time For Goal Achievement: 04/07/15 Potential to Achieve Goals: Good Progress towards PT goals: Progressing toward goals    Frequency  Min 3X/week    PT Plan Current plan remains appropriate    Co-evaluation             End of Session Equipment Utilized During Treatment: Gait belt Activity Tolerance: Patient limited by fatigue Patient left: in bed;with call bell/phone within reach;with nursing/sitter in room;with family/visitor present (sitting EOB)     Time: 1010-1035 PT Time Calculation (min) (ACUTE ONLY): 25 min  Charges:  $Gait Training: 8-22 mins $Therapeutic Activity: 8-22 mins  G CodesFabio Asa 27-Mar-2015, 12:47 PM Charlotte Crumb, PT DPT  [phone]

## 2015-03-25 NOTE — Progress Notes (Addendum)
Advanced Heart Failure Rounding Note   Subjective:    Admitted with suspected contrast nephropathy. Remains on milrinone. Diuretics restarted yesterday.   Denies SOB.   Creatinine 3.3>3.02     Objective:   Weight Range:  Vital Signs:   Temp:  [97.4 F (36.3 C)-98.4 F (36.9 C)] 97.5 F (36.4 C) (03/25 0655) Pulse Rate:  [81-93] 81 (03/25 0655) Resp:  [17-18] 17 (03/25 0655) BP: (125-149)/(61-75) 140/62 mmHg (03/25 0655) SpO2:  [95 %-97 %] 96 % (03/25 0655) Weight:  [180 lb (81.647 kg)] 180 lb (81.647 kg) (03/25 0655) Last BM Date: 03/24/15  Weight change: Filed Weights   03/23/15 0500 03/24/15 0645 03/25/15 0655  Weight: 176 lb 4.8 oz (79.969 kg) 179 lb 12.8 oz (81.557 kg) 180 lb (81.647 kg)    Intake/Output:   Intake/Output Summary (Last 24 hours) at 03/25/15 0917 Last data filed at 03/25/15 0600  Gross per 24 hour  Intake 1148.4 ml  Output   1651 ml  Net -502.6 ml     Physical Exam: General: NAD . In bed.  HEENT: normal Neck: supple. JVP 8-9 . Carotids 2+ bilaterally; no bruits. No lymphadenopathy or thryomegaly appreciated. Cor: PMI normal. Regular tachy. No rubs, or murmurs.  Lungs: clear Abdomen: soft, nontender, nondistended. No hepatosplenomegaly. No bruits or masses. Good bowel sounds. Extremities: no cyanosis, clubbing, rash, edema, RUE 2 lumen PICC - no drainage Neuro: alert & orientedx3, cranial nerves grossly intact. Moves all 4 extremities w/o difficulty. Affect pleasant   Telemetry:  SR 80s  Labs: Basic Metabolic Panel:  Recent Labs Lab 03/22/15 1601 03/23/15 0505 03/24/15 0530 03/25/15 0520  NA 136 137 137 136  K 6.7* 4.1  4.0 3.8 3.9  CL 107 105 106 107  CO2 19 27 24 25   GLUCOSE 79 105* 100* 88  BUN 63* 58* 55* 43*  CREATININE 4.70* 4.15* 3.30* 3.02*  CALCIUM 8.8 8.8 8.9 8.9    Liver Function Tests:  Recent Labs Lab 03/22/15 1601  AST 97*  ALT 30  ALKPHOS 88  BILITOT 1.6*  PROT 7.4  ALBUMIN 3.0*   No results  for input(s): LIPASE, AMYLASE in the last 168 hours. No results for input(s): AMMONIA in the last 168 hours.  CBC:  Recent Labs Lab 03/22/15 1601 03/23/15 0505 03/24/15 0530  WBC 3.8* 3.2* 3.5*  NEUTROABS 2.4  --   --   HGB 9.7* 8.8* 8.5*  HCT 29.3* 27.2* 26.2*  MCV 60.4* 60.6* 60.9*  PLT 235 196 171    Cardiac Enzymes:  Recent Labs Lab 03/22/15 1601 03/23/15 0505  TROPONINI 0.12* 0.13*    BNP: BNP (last 3 results)  Recent Labs  03/15/15 2150 03/22/15 1601  BNP 701.4* 930.9*    ProBNP (last 3 results)  Recent Labs  11/08/14 1800  PROBNP 2681.0*      Other results:    Imaging: Dg Chest Port 1 View  03/24/2015   CLINICAL DATA:  Rales on chest exam  EXAM: PORTABLE CHEST - 1 VIEW  COMPARISON:  PA and lateral chest of March 22, 2015  FINDINGS: The lungs are less well inflated today. The pulmonary interstitial markings are more prominent especially in the right lung. The right hemidiaphragm remains obscured with a right pleural effusion suspected. The left hemidiaphragm is sharp. The cardiac silhouette remains enlarged. The central pulmonary vascularity is engorged and less distinct today. The AICD is unchanged in position. The right internal jugular venous catheter tip projects over the proximal portion of the SVC.  IMPRESSION: Findings worrisome for cardiac decompensation with pulmonary edema and small right pleural effusion. If the patient can tolerate the procedure, a PA and lateral chest x-ray with deep inspiration would be useful.   Electronically Signed   By: David  [place]   On: 03/24/2015 12:26      Medications:     Scheduled Medications: . calcitRIOL  0.25 mcg Oral Daily  . digoxin  0.125 mg Oral Daily  . DULoxetine  60 mg Oral Daily  . ferrous sulfate  325 mg Oral TID WC  . furosemide  40 mg Oral Daily  . gabapentin  200 mg Oral QHS  . heparin  5,000 Units Subcutaneous 3 times per day  . insulin aspart  0-9 Units Subcutaneous TID WC  .  pantoprazole  40 mg Oral Daily  . rOPINIRole  2 mg Oral QHS  . sodium chloride  3 mL Intravenous Q12H  . sodium chloride  3 mL Intravenous Q12H  . zolpidem  5 mg Oral QHS     Infusions: . milrinone 0.375 mcg/kg/min (03/25/15 0830)     PRN Medications:  sodium chloride, acetaminophen, nitroGLYCERIN, ondansetron **OR** ondansetron (ZOFRAN) IV, sodium chloride, sodium chloride   Assessment:  1.  Chronic Systolic HF, ICM s/p ICD; EF ~ 30-35% on milrinone 0.375 mcg (07/2014) 2. Acute on chronic renal failure  - Suspect probable contrast nephropathy. Now recovering 3. CAD S/P PCI with DES to prox RCA and stent to distal RCA in 2012. No s/s of ischemia.  4. History of liver failure and cirrhosis. Treated at [facility]---> thought to be from cardiomyopathy 5. PSVT - continue amio 200 daily.  6. Microcytic anemia    Plan/Discussion:    From HF perspective she is stable. Renal function continues to trend down. Volume status ok. Continue lasix 40 mg daily. No BB with low output. Dig level on 3/22 was 1.9 will stop dig now.  Add hydralazine 25 mg tid. Intolerant imdur due to headaches. No ace/spir with CKD.   Check Iron stores now.   Check Mag   Length of Stay: 3  CLEGG,AMY NP-C  03/25/2015, 9:17 AM  Advanced Heart Failure Team Pager [phone] (M-F; 7a - 4p)  Please contact [facility] for night-coverage after hours (4p -7a ) and weekends on [email or web address]  Patient seen and examined with Tonye Becket, NP. We discussed all aspects of the encounter. I agree with the assessment and plan as stated above.   Stable from HF perspective. Renal function improving. Continue po lasix. Will check co-ox. BP up so ok to start hydralazine. Would not lower too much as to maintain renal perfusion. We will see her again Monday if still inpatient but suspect she will likely be stable for d/c in am.   Truman Hayward 9:43 AM

## 2015-03-26 LAB — BASIC METABOLIC PANEL
Anion gap: 5 (ref 5–15)
BUN: 35 mg/dL — ABNORMAL HIGH (ref 6–23)
CO2: 28 mmol/L (ref 19–32)
CREATININE: 2.64 mg/dL — AB (ref 0.50–1.10)
Calcium: 9 mg/dL (ref 8.4–10.5)
Chloride: 106 mmol/L (ref 96–112)
GFR calc Af Amer: 21 mL/min — ABNORMAL LOW (ref >90)
GFR calc non Af Amer: 18 mL/min — ABNORMAL LOW (ref >90)
GLUCOSE: 93 mg/dL (ref 70–99)
POTASSIUM: 4.1 mmol/L (ref 3.5–5.1)
Sodium: 139 mmol/L (ref 135–145)

## 2015-03-26 LAB — CULTURE, GROUP A STREP: Strep A Culture: NEGATIVE

## 2015-03-26 LAB — GLUCOSE, CAPILLARY
GLUCOSE-CAPILLARY: 78 mg/dL (ref 70–99)
GLUCOSE-CAPILLARY: 87 mg/dL (ref 70–99)
Glucose-Capillary: 93 mg/dL (ref 70–99)

## 2015-03-26 MED ORDER — FERROUS SULFATE 325 (65 FE) MG PO TABS: 325.0000 mg | ORAL_TABLET | Freq: Three times a day (TID) | ORAL | Status: AC

## 2015-03-26 MED ORDER — SODIUM CHLORIDE 0.9 % IV SOLN
510.0000 mg | INTRAVENOUS | Status: DC
  Administered 2015-03-26: 510 mg via INTRAVENOUS
  Filled 2015-03-26: qty 17

## 2015-03-26 MED ORDER — HYDRALAZINE HCL 25 MG PO TABS: 25.0000 mg | ORAL_TABLET | Freq: Three times a day (TID) | ORAL | Status: DC

## 2015-03-26 NOTE — Discharge Summary (Signed)
Physician Discharge Summary  Andrea [name]:[id number] DOB: 11-19-1951 DOA: 03/22/2015  PCP: Lolita Patella, MD  Admit date: 03/22/2015 Discharge date: 03/26/2015  Recommendations for Outpatient Follow-up:  1. Follow-up with cardiology within 1 week. Will need repeat CBC and BMP to follow-up anemia and kidney function.  2. We will need repeat iron studies and hemoglobin approximately 1 month. Given prescription for oral iron supplementation 3. Continue milrinone infusion through PICC line at 0.375 mcg/kg/min, with resumption of home health nurse   Discharge Diagnoses:  Principal Problem:   AKI (acute kidney injury) Active Problems:   Chronic systolic congestive heart failure, NYHA class 4-EF 15% per ECHO 2011   Hypertension   Diabetes mellitus type 2, controlled   CKD (chronic kidney disease)   Hyperkalemia   Discharge Condition: Stable, improved   Diet recommendation: Diabetic, low-sodium   Wt Readings from Last 3 Encounters:  03/26/15 82.237 kg (181 lb 4.8 oz)  03/15/15 80.967 kg (178 lb 8 oz)  02/03/15 81.647 kg (180 lb)    History of present illness:  The patient is a 64 year old female with history of chronic systolic heart failure with ejection fraction of 15% on continuous milrinone infusion, hypertension, type 2 diabetes mellitus, anemia of chronic disease, chronic kidney disease stage III, who was advised to come to the emergency department secondary to abnormal labs. One week prior to presentation she had nausea, vomiting, and diarrhea which have since resolved. She likely became relatively dehydrated and she also had a contrasted CT scan on 3/16.  In the ER, her potassium was 6.7 and her creatinine was up to 4.7 with a baseline of 1.1. Her diuretics were held and her kidney function has gradually improved. She developed worsening rales on 3/24 which are gradually improving after restarting diuretics.  Her breathing improved, rales resolved and kidney  function continued to improve despite resuming diuretics.    Hospital Course:   Acute kidney injury, likely due to ATN from dehydration and CIN from CT scan on 3/16.  op increasing and creatinine trending down, peak 4.7 on 3/22, trended down to 2.64 and has continued to improve despite resumption of lasix. - Nephrology was consulted -  Diuretics were held and her kidney function gradually improved - resumed lasix on 3/24 and creatinine continued to improve - BP remained stable so were able to continue milrinone at previous dose  NYHA class III/IV ischemic systolic heart failure with EF 15% on home milrinone infusion, rales have resolved. - Continue home dose lasix  - Repeat CXR 3/25: Worsening pulmonary edema/decompensation - continue milrinone, BP stable - suspect weights have been inaccurate: 80 -- 80 -- 81.5 -- 81.6 -- 82.2  - Strict I/O: -172 ml yesterday - Hydralazine added and blood pressure remained stable - Heart failure team consulted and assisted with medication management  Sore throat, likely viral or allergic, however, patient worried about strep throat. Afebrile, no lymphadenopathy - Rapid strep test: neg  Hyperkalemia due to AKI - Resolved with kayexelate  Sinus congestion and headache. DDx includes URI v. Allergies - Offered flonase and afrin, which she declined - Continue claritin - continue tylenol prn  Leukopenia, likely benign, chronic and stable  Iron deficiency anemia.  She had iron deficiency from blood work done recently.  B12, folate, and TSH levels done recently were normal - Started iron supplementation  - Folate, B12, and iron studies were reordered by cardiology which again confirmed iron deficiency -  Cardiology ordered 1 dose of IV iron  -  Continue oral supplementation at discharge -  Repeat iron studies and hemoglobin in 1 month  Stable, but mildly elevated troponin likely secondary to AKI in setting of severe heart failure    Bipolar disorder, chronic, stable - continued cymbalta  T2DM with low normal CBG,  - A1c 6.4 on 03/23/2015  GERD, stable - Continued protonix and ranitidine  Hypomagnesemia - Given one dose of IV magnesium  Consultants:  Nephrology  Heart failure service  Procedures:  CXR  Antibiotics:  none  Discharge Exam: Filed Vitals:   03/26/15 0650  BP: 114/66  Pulse: 88  Temp: 97.7 F (36.5 C)  Resp: 18   Filed Vitals:   03/25/15 1458 03/25/15 1500 03/25/15 2203 03/26/15 0650  BP: 132/63 134/60 140/66 114/66  Pulse: 81 86 93 88  Temp:  97.5 F (36.4 C) 97.8 F (36.6 C) 97.7 F (36.5 C)  TempSrc:  Oral Oral Oral  Resp:  18 18 18   Height:      Weight:    82.237 kg (181 lb 4.8 oz)  SpO2:  96% 100% 100%     General: Adult female, No acute distress  HEENT: NCAT, MMM  Cardiovascular: RRR, nl S1, S2, 2/6 systolic murmur, 2+ pulses, warm extremities  Respiratory: CTAB, no increased WOB  Abdomen: NABS, soft, NT/ND  MSK: Normal tone and bulk, trace nonpitting edema of ankles  Neuro: Grossly intact  Discharge Instructions      Discharge Instructions    (HEART FAILURE PATIENTS) Call MD:  Anytime you have any of the following symptoms: 1) 3 pound weight gain in 24 hours or 5 pounds in 1 week 2) shortness of breath, with or without a dry hacking cough 3) swelling in the hands, feet or stomach 4) if you have to sleep on extra pillows at night in order to breathe.    Complete by:  As directed      Call MD for:  difficulty breathing, headache or visual disturbances    Complete by:  As directed      Call MD for:  extreme fatigue    Complete by:  As directed      Call MD for:  hives    Complete by:  As directed      Call MD for:  persistant dizziness or light-headedness    Complete by:  As directed      Call MD for:  persistant nausea and vomiting    Complete by:  As directed      Call MD for:  severe uncontrolled pain    Complete by:  As directed       Call MD for:  temperature >100.4    Complete by:  As directed      Diet - low sodium heart healthy    Complete by:  As directed      Diet Carb Modified    Complete by:  As directed      Discharge instructions    Complete by:  As directed   You were hospitalized with kidney problems from dehydration and from your CT scan you had a week ago.  Please do not take ibuprofen, Naprosyn, additional aspirin or over-the-counter medications other than Tylenol without consulting your doctor. Please stop your digoxin because of your kidney problems. This medication may be resumed later if your kidneys fully recover. You had some iron deficiency anemia and were given IV iron during this hospitalization. Please continue iron supplements at home. For your heart failure, please start taking  hydralazine 3 times a day. Weight yourself each day and if you gain more than 3 pounds in 1 day or if you gain more than 5 pounds since the time of your discharge, please call the cardiology office. Please eat a low-salt diet and make sure you take your medications every day. If you develop chest pain, shortness of breath, fainting spells, or any other concerning symptoms, please seek immediate medical attention.     Increase activity slowly    Complete by:  As directed             Medication List    STOP taking these medications        digoxin 0.125 MG tablet  Commonly known as:  LANOXIN      TAKE these medications        acetaminophen 500 MG tablet  Commonly known as:  TYLENOL  Take 500 mg by mouth every 6 (six) hours as needed for mild pain.     calcitRIOL 0.25 MCG capsule  Commonly known as:  ROCALTROL  Take 1 capsule by mouth daily.     diclofenac sodium 1 % Gel  Commonly known as:  VOLTAREN  Apply 2 g topically 4 (four) times daily as needed (pain).     DULoxetine 60 MG capsule  Commonly known as:  CYMBALTA  Take 60 mg by mouth daily.     ferrous sulfate 325 (65 FE) MG tablet  Take 1 tablet  (325 mg total) by mouth 3 (three) times daily with meals.     furosemide 40 MG tablet  Commonly known as:  LASIX  Take 1 tablet (40 mg total) by mouth 3 (three) times a week. Mon-Wed-Fri     gabapentin 100 MG capsule  Commonly known as:  NEURONTIN  Take 200 mg by mouth at bedtime.     hydrALAZINE 25 MG tablet  Commonly known as:  APRESOLINE  Take 1 tablet (25 mg total) by mouth every 8 (eight) hours.     magnesium oxide 400 (241.3 MG) MG tablet  Commonly known as:  MAG-OX  Take 1 tablet (400 mg total) by mouth 3 (three) times daily.     milrinone 20 MG/100ML Soln infusion  Commonly known as:  PRIMACOR  Inject 30.225 mcg/min into the vein continuous.     nitroGLYCERIN 0.4 MG SL tablet  Commonly known as:  NITROSTAT  Place 1 tablet (0.4 mg total) under the tongue every 5 (five) minutes as needed for chest pain (MAX 3 TABLETS IN 15 MINUTES).     ondansetron 8 MG disintegrating tablet  Commonly known as:  ZOFRAN ODT  Take 1 tablet (8 mg total) by mouth every 8 (eight) hours as needed for nausea or vomiting.     pantoprazole 40 MG tablet  Commonly known as:  PROTONIX  Take 1 tablet by mouth daily.     polyethylene glycol packet  Commonly known as:  MIRALAX / GLYCOLAX  Take 17 g by mouth daily as needed for mild constipation.     ranitidine 300 MG tablet  Commonly known as:  ZANTAC  Take 1 tablet by mouth daily.     rOPINIRole 2 MG tablet  Commonly known as:  REQUIP  Take 0.5 tablets by mouth at bedtime.     zolpidem 5 MG tablet  Commonly known as:  AMBIEN  Take 1 tablet by mouth at bedtime.       Follow-up Information    Follow up with Lolita Patella, MD. Schedule an  appointment as soon as possible for a visit in 3 weeks.   Specialty:  Family Medicine   Contact information:   [phone] [address] [place] [address] [phone]       Follow up with [name], MD. Schedule an appointment as soon as possible for a visit in 1 week.    Specialty:  Cardiology   Contact information:   [address] [place] [address] [phone]        The results of significant diagnostics from this hospitalization (including imaging, microbiology, ancillary and laboratory) are listed below for reference.    Significant Diagnostic Studies: Dg Chest 2 View  03/22/2015   CLINICAL DATA:  64 year old female with weakness, dizziness, hypertension. Initial encounter.  EXAM: CHEST  2 VIEW  COMPARISON:  CT Abdomen and Pelvis 11/08/2014 and earlier.  FINDINGS: Small to moderate right pleural effusion appears increased since November 2015. Stable cardiomegaly and mediastinal contours. Stable left chest cardiac AICD. Right chest tunneled catheter is new since November. No pneumothorax or pulmonary edema. No consolidation. Stable visualized osseous structures. Calcified atherosclerosis of the aorta.  IMPRESSION: Increased right pleural effusion since November 2015, now small to moderate.  No other acute cardiopulmonary abnormality.   Electronically Signed   By: Odessa Fleming M.D.   On: 03/22/2015 15:51   Ct Abdomen Pelvis W Contrast  03/16/2015   CLINICAL DATA:  Acute onset of generalized abdominal pain and tenderness. Vomiting. Initial encounter.  EXAM: CT ABDOMEN AND PELVIS WITH CONTRAST  TECHNIQUE: Multidetector CT imaging of the abdomen and pelvis was performed using the standard protocol following bolus administration of intravenous contrast.  CONTRAST:  100 mL of Omnipaque 300 IV contrast  COMPARISON:  CT of the abdomen and pelvis from 11/08/2014  FINDINGS: A trace right pleural effusion is noted. Right basilar airspace opacity likely reflects atelectasis. Scattered coronary artery calcifications are seen. An AICD lead is partially imaged.  The liver and spleen are unremarkable in appearance. Trace fluid is suggested adjacent to the gallbladder. Stones are noted dependently within the gallbladder. The gallbladder is otherwise  unremarkable, without significant gallbladder wall thickening. The pancreas and adrenal glands are unremarkable.  Nonspecific perinephric stranding is noted bilaterally. Bilateral fetal lobulations are noted. No renal or ureteral stones are seen. There is no evidence of hydronephrosis.  No free fluid is identified. The small bowel is unremarkable in appearance. The stomach is within normal limits. No acute vascular abnormalities are seen.  The appendix is normal in caliber, without evidence for appendicitis. The colon is unremarkable in appearance.  The bladder is mildly distended and grossly unremarkable. Multiple fibroids are seen within the uterus, with mild associated calcification. The ovaries are relatively symmetric. No suspicious adnexal masses are seen. No inguinal lymphadenopathy is seen.  No acute osseous abnormalities are identified. There is chronic mild loss of height at vertebral bodies T11, T12 and L1, with mild associated degenerative change and vacuum phenomenon. Underlying facet disease is noted along the lower thoracic and lumbar spine.  IMPRESSION: 1. Trace fluid suggested adjacent to the gallbladder, new from the prior study. Stones dependently within the gallbladder are stable in appearance. Gallbladder otherwise unremarkable. 2. Trace right pleural effusion, with right basilar airspace opacity likely reflecting atelectasis. 3. Scattered coronary artery calcifications seen. 4. Uterine fibroids noted. 5. Chronic mild loss of height at T11, T12 and L1, with mild associated degenerative change.   Electronically Signed   By: Roanna Raider M.D.   On: 03/16/2015  01:37   Dg Chest Port 1 View  03/24/2015   CLINICAL DATA:  Rales on chest exam  EXAM: PORTABLE CHEST - 1 VIEW  COMPARISON:  PA and lateral chest of March 22, 2015  FINDINGS: The lungs are less well inflated today. The pulmonary interstitial markings are more prominent especially in the right lung. The right hemidiaphragm remains obscured  with a right pleural effusion suspected. The left hemidiaphragm is sharp. The cardiac silhouette remains enlarged. The central pulmonary vascularity is engorged and less distinct today. The AICD is unchanged in position. The right internal jugular venous catheter tip projects over the proximal portion of the SVC.  IMPRESSION: Findings worrisome for cardiac decompensation with pulmonary edema and small right pleural effusion. If the patient can tolerate the procedure, a PA and lateral chest x-ray with deep inspiration would be useful.   Electronically Signed   By: David  [place]   On: 03/24/2015 12:26    Microbiology: Recent Results (from the past 240 hour(s))  Rapid strep screen     Status: None   Collection Time: 03/24/15  1:48 PM  Result Value Ref Range Status   Streptococcus, Group A Screen (Direct) NEGATIVE NEGATIVE Final    Comment: (NOTE) A Rapid Antigen test may result negative if the antigen level in the sample is below the detection level of this test. The FDA has not cleared this test as a stand-alone test therefore the rapid antigen negative result has reflexed to a Group A Strep culture.      Labs: Basic Metabolic Panel:  Recent Labs Lab 03/22/15 1601 03/23/15 0505 03/24/15 0530 03/25/15 0520 03/26/15 0530  NA 136 137 137 136 139  K 6.7* 4.1  4.0 3.8 3.9 4.1  CL 107 105 106 107 106  CO2 19 27 24 25 28   GLUCOSE 79 105* 100* 88 93  BUN 63* 58* 55* 43* 35*  CREATININE 4.70* 4.15* 3.30* 3.02* 2.64*  CALCIUM 8.8 8.8 8.9 8.9 9.0  MG  --   --   --  1.7  --    Liver Function Tests:  Recent Labs Lab 03/22/15 1601  AST 97*  ALT 30  ALKPHOS 88  BILITOT 1.6*  PROT 7.4  ALBUMIN 3.0*   No results for input(s): LIPASE, AMYLASE in the last 168 hours. No results for input(s): AMMONIA in the last 168 hours. CBC:  Recent Labs Lab 03/22/15 1601 03/23/15 0505 03/24/15 0530  WBC 3.8* 3.2* 3.5*  NEUTROABS 2.4  --   --   HGB 9.7* 8.8* 8.5*  HCT 29.3* 27.2* 26.2*  MCV  60.4* 60.6* 60.9*  PLT 235 196 171   Cardiac Enzymes:  Recent Labs Lab 03/22/15 1601 03/23/15 0505  TROPONINI 0.12* 0.13*   BNP: BNP (last 3 results)  Recent Labs  03/15/15 2150 03/22/15 1601  BNP 701.4* 930.9*    ProBNP (last 3 results)  Recent Labs  11/08/14 1800  PROBNP 2681.0*    CBG:  Recent Labs Lab 03/24/15 2201 03/25/15 0558 03/25/15 1123 03/25/15 1644 03/25/15 2159  GLUCAP 88 78 93 110* 146*    Time coordinating discharge: 35 minutes  Signed:  Saren Corkern  [facility] 03/26/2015, 10:50 AM

## 2015-03-31 ENCOUNTER — Encounter: Payer: Self-pay | Admitting: Internal Medicine

## 2015-04-04 ENCOUNTER — Telehealth (HOSPITAL_COMMUNITY): Payer: Self-pay | Admitting: Vascular Surgery

## 2015-04-04 NOTE — Telephone Encounter (Signed)
May increase Lasix to 40 mg bid until followup, would have her seen within 1 week.  Needs BMET 1 week.

## 2015-04-04 NOTE — Telephone Encounter (Signed)
Pt reports she has a handle on the SOB right now Pt states she is unable to lay flat Pt uses one pillow to rest, if she could use 2 pillows she would Weight stable 176-178 Mild LE edema   Advised may need to change meds for a few days until she is able to be seen by CHF however no provider available to me at this time Will review with provider first thing in the AM, advised if SOB returns or if she feels worse pt should report to ER for further evaluation

## 2015-04-04 NOTE — Telephone Encounter (Signed)
Pt is having trouble breathing she wants to know what she should do.. Please advise

## 2015-04-05 MED ORDER — FUROSEMIDE 40 MG PO TABS
40.0000 mg | ORAL_TABLET | Freq: Two times a day (BID) | ORAL | Status: DC
Start: 2015-04-05 — End: 2015-04-13

## 2015-04-05 NOTE — Telephone Encounter (Signed)
Pt aware and agreeable, appt is sch for 4/13, pt is getting weekly bmets

## 2015-04-13 ENCOUNTER — Encounter (HOSPITAL_COMMUNITY): Payer: Self-pay

## 2015-04-13 ENCOUNTER — Ambulatory Visit (HOSPITAL_COMMUNITY)
Admission: RE | Admit: 2015-04-13 | Discharge: 2015-04-13 | Disposition: A | Discharge status: Home / Self Care | Payer: BLUE CROSS/BLUE SHIELD | Source: Ambulatory Visit | Attending: Internal Medicine | Admitting: Internal Medicine

## 2015-04-13 VITALS — BP 136/72 | HR 98 | Wt 172.0 lb

## 2015-04-13 DIAGNOSIS — E669 Obesity, unspecified: Secondary | ICD-10-CM | POA: Insufficient documentation

## 2015-04-13 DIAGNOSIS — Z8673 Personal history of transient ischemic attack (TIA), and cerebral infarction without residual deficits: Secondary | ICD-10-CM | POA: Diagnosis not present

## 2015-04-13 DIAGNOSIS — N183 Chronic kidney disease, stage 3 (moderate): Secondary | ICD-10-CM | POA: Insufficient documentation

## 2015-04-13 DIAGNOSIS — Z95818 Presence of other cardiac implants and grafts: Secondary | ICD-10-CM | POA: Insufficient documentation

## 2015-04-13 DIAGNOSIS — I2510 Atherosclerotic heart disease of native coronary artery without angina pectoris: Secondary | ICD-10-CM | POA: Diagnosis not present

## 2015-04-13 DIAGNOSIS — I129 Hypertensive chronic kidney disease with stage 1 through stage 4 chronic kidney disease, or unspecified chronic kidney disease: Secondary | ICD-10-CM | POA: Diagnosis not present

## 2015-04-13 DIAGNOSIS — Z79899 Other long term (current) drug therapy: Secondary | ICD-10-CM | POA: Diagnosis not present

## 2015-04-13 DIAGNOSIS — E785 Hyperlipidemia, unspecified: Secondary | ICD-10-CM | POA: Insufficient documentation

## 2015-04-13 DIAGNOSIS — I252 Old myocardial infarction: Secondary | ICD-10-CM | POA: Insufficient documentation

## 2015-04-13 DIAGNOSIS — I255 Ischemic cardiomyopathy: Secondary | ICD-10-CM | POA: Insufficient documentation

## 2015-04-13 DIAGNOSIS — J45909 Unspecified asthma, uncomplicated: Secondary | ICD-10-CM | POA: Diagnosis not present

## 2015-04-13 DIAGNOSIS — M109 Gout, unspecified: Secondary | ICD-10-CM | POA: Insufficient documentation

## 2015-04-13 DIAGNOSIS — I272 Other secondary pulmonary hypertension: Secondary | ICD-10-CM | POA: Diagnosis not present

## 2015-04-13 DIAGNOSIS — I259 Chronic ischemic heart disease, unspecified: Secondary | ICD-10-CM

## 2015-04-13 DIAGNOSIS — I5022 Chronic systolic (congestive) heart failure: Secondary | ICD-10-CM | POA: Diagnosis present

## 2015-04-13 DIAGNOSIS — Z95810 Presence of automatic (implantable) cardiac defibrillator: Secondary | ICD-10-CM | POA: Insufficient documentation

## 2015-04-13 DIAGNOSIS — E119 Type 2 diabetes mellitus without complications: Secondary | ICD-10-CM | POA: Diagnosis not present

## 2015-04-13 MED ORDER — FUROSEMIDE 40 MG PO TABS: 40.0000 mg | ORAL_TABLET | Freq: Every day | ORAL | Status: AC

## 2015-04-13 MED ORDER — HYDRALAZINE HCL 25 MG PO TABS: 25.0000 mg | ORAL_TABLET | Freq: Three times a day (TID) | ORAL | Status: AC

## 2015-04-13 NOTE — Progress Notes (Signed)
Patient ID: Andrea [name], female   DOB: [birth year], 64 y.o.   MRN: [id number] PCP: Dr. Elias Else  [facility])   HPI: Andrea [name] is a 64 y.o. female with a history of CAD DES to prox RCA and stent to dist RCA in 2012, chronic systolic heart failure, ICM s/p ICD placement, liver failure thougth to be from cardiomyopathy -seen at [facility], CVA intolerant aspirin , CKD, GERD, and DM.  Admitted 03/08/14 with low output HF. Admitting creatinine was 1.3 but increased to 4.49.  She was placed on Milrinone and diuresed with IV lasix. Milrinone was weaned off but restarted due to mixed venous saturation 39%. At that point she was discharged on Milrinone at 0.125 mcg via PICC. Discharge creatinine was 2.0. Discharge weight was 176 pounds.  Milrinone subsequently titrated to 0.375 mcg/kg/min  Follow up for Heart Failure: Remains on Milrinone 0.375 mcg. Since the last visit she was admitted with suspected contrast nephropathy from CT of chest.  Diuretics held but restarted prior to discharge. Overall feeing ok. Today is a good day. Denies SOB/PND/Orthopnea. Weight at home 170-179 pounds. Taking lasix 40 mg twice a day for lower extremity edema since 04/04/2015. Taking all medications. Only taking hydralazine 25 mg twice a day.  Followed by [facility] for home milrinone and weekly BMET.   R/LHC 04/12/14: RA = 10  RV = 60/4/8  PA = 60/30 (42)  PCWP = 25  Fick CO/CI = 3.6/2.1  Thermo CO/CI = 2.9/1.6  PVR = 5.8  SVR = 1,911  Ao sat = 91%  PA sats = 46%, 49% 1. Nonobstructive CAD with patent stent RCA  ECHO 03/11/14 EF 15% RV ok  ECHO 08/09/14 Bedside EF 30-35%  Labs 03/29/14; Cr 1.3 k 4.1 co-ox 76%          04/20/14: K+ 5.3, creatinine 1.15          04/26/14: K 5.1 Cr 1.01          06/01/14:   K 4.0, creatinine 1.28          07/20/14: K 3.5 Cr 1.16          08/25/14 J 4,5 Creatinine 1.4          09/07/14 K 4.3 Creatinine 1.76 Magnesium 2.2  Coreg stopped and lasix was help for 2 days.         11/08/14: K  4.5, creatinine 1.02, Magnesium 1.9        11/15/14: K+ 4.6, creatinine 1.28, Mag 1.8, AST 30, ALT 18, T4 8.1, TSH 1.8, T3 total 71.9            01/31/2015 K 5.4 Creatinine 1.22         03/26/2015: K 4.1 Creatinine 2.64          04/11/2015: K 5.0 Creatinine 1.65    SH: Lives with partner, [name] and 1 year old son. She does not drive. Quit smoking 25 years ago. She does not drink alcohol  FH:   Mom Heart Failure          Father MI          Brother Heart Disease     ROS: All systems negative except as listed in HPI, PMH and Problem List.  Past Medical History  Diagnosis Date  . Chronic systolic heart failure   . Hypertension   . Stroke 02/2010    left brain CVA? recurrent TIA's treated with TPA  . Dyslipidemia   .  Premature ventricular contractions (PVCs) (VPCs)   . GERD (gastroesophageal reflux disease)   . Coronary artery disease     a. DES-dRCA 11/2010. b. DES-pRCA 04/2011 (in an area free of disease on prior cath). c. s/p DES-mRCA 03/2013 for NSTEMI.  . Ischemic cardiomyopathy     a. Hx of medtronic ICD. b. EF 15-20% by cath 03/2013.  . Pulmonary hypertension   . Gout   . Obesity   . Thrombocytopenia   . Depression   . Hypotension     a. Admission 09/2012 for hypotn & ARF. b. Meds held 03/2013 due to hypotension.  . Acute renal insufficiency     a. 09/2012. b. Also noted post-cath 03/2013.  . NSTEMI (non-ST elevated myocardial infarction) 03/2013  . Anginal pain   . Asthma   . Microcytic anemia     a. Noted 03/2013 on labs, iron studies WNL.  Marland Kitchen Anxiety   . CHF (congestive heart failure)   . CKD (chronic kidney disease) stage 3, GFR 30-59 ml/min   . HTN (hypertension)   . Primary pulmonary HTN   . Automatic implantable cardiac defibrillator - Medtronic 08/24/2013  . Heart murmur   . Type II diabetes mellitus   . Migraine headache     "no pain; aura/visual problems only; at least 2-3X/month" (01/18/2014)  . Arthritis     "knees" (01/18/2014)  . DJD (degenerative joint  disease)   . Bipolar disorder     Current Outpatient Prescriptions  Medication Sig Dispense Refill  . acetaminophen (TYLENOL) 500 MG tablet Take 500 mg by mouth every 6 (six) hours as needed for mild pain.    . calcitRIOL (ROCALTROL) 0.25 MCG capsule Take 1 capsule by mouth daily.    . diclofenac sodium (VOLTAREN) 1 % GEL Apply 2 g topically 4 (four) times daily as needed (pain).     . DULoxetine (CYMBALTA) 60 MG capsule Take 60 mg by mouth daily.    . ferrous sulfate 325 (65 FE) MG tablet Take 1 tablet (325 mg total) by mouth 3 (three) times daily with meals. 90 tablet 0  . furosemide (LASIX) 40 MG tablet Take 1 tablet (40 mg total) by mouth 2 (two) times daily. 30 tablet 6  . gabapentin (NEURONTIN) 100 MG capsule Take 200 mg by mouth at bedtime.    . hydrALAZINE (APRESOLINE) 25 MG tablet Take 1 tablet (25 mg total) by mouth every 8 (eight) hours. 90 tablet 0  . magnesium oxide (MAG-OX) 400 (241.3 MG) MG tablet Take 1 tablet (400 mg total) by mouth 3 (three) times daily. 90 tablet 6  . milrinone (PRIMACOR) 20 MG/100ML SOLN infusion Inject 30.225 mcg/min into the vein continuous. 100 mL   . ondansetron (ZOFRAN ODT) 8 MG disintegrating tablet Take 1 tablet (8 mg total) by mouth every 8 (eight) hours as needed for nausea or vomiting. 20 tablet 0  . pantoprazole (PROTONIX) 40 MG tablet Take 1 tablet by mouth daily.    Marland Kitchen rOPINIRole (REQUIP) 2 MG tablet Take 0.5 tablets by mouth at bedtime.    Marland Kitchen zolpidem (AMBIEN) 5 MG tablet Take 1 tablet by mouth at bedtime.    . nitroGLYCERIN (NITROSTAT) 0.4 MG SL tablet Place 1 tablet (0.4 mg total) under the tongue every 5 (five) minutes as needed for chest pain (MAX 3 TABLETS IN 15 MINUTES). 25 tablet 3  . polyethylene glycol (MIRALAX / GLYCOLAX) packet Take 17 g by mouth daily as needed for mild constipation.    . ranitidine (ZANTAC) 300  MG tablet Take 1 tablet by mouth daily.     No current facility-administered medications for this encounter.    Filed  Vitals:   04/13/15 1502  BP: 136/72  Pulse: 98  Weight: 172 lb (78.019 kg)  SpO2: 96%   PHYSICAL EXAM: General:  NAD  HEENT: normal Neck: supple. JVP 6-7 . Carotids 2+ bilaterally; no bruits. No lymphadenopathy or thryomegaly appreciated. Cor: PMI normal.  Regular No rubs, or murmurs.  Lungs: clear Abdomen: soft, nontender, nondistended. No hepatosplenomegaly. No bruits or masses. Good bowel sounds. Extremities: no cyanosis, clubbing, rash, edema, RUE 2 lumen PICC - no drainage Neuro: alert & orientedx3, cranial nerves grossly intact. Moves all 4 extremities w/o difficulty. Affect pleasant   ASSESSMENT & PLAN:  1. Chronic Systolic HF, ICM s/p ICD; EF ~ 30-35% on milrinone 0.375 mcg (07/2014). Not interested in VAD.  - Chronic NYHA IIIB symptom on inotrope therapy. Volume status stable. Cut Lasix back to 40 mg daily. - Increase hydralazine to 25 mg three times a day. I have asked her to bring all medications to next appointment. Also asked her to follow medication list provided today.   Unable to tolerate IMDUR due to severe HAs in setting of known migraines.  Continue weekly BMET by [facility]  - Reinforced the need and importance of daily weights, a low sodium diet, and fluid restriction (less than 2 L a day). Instructed to call the HF clinic if weight increases more than 3 lbs overnight or 5 lbs in a week. ] 2. CKD stage III - Baseline creatinine running 1.4-1.6. Reviewed lab work from 04/11/2015.   3. CAD S/P PCI with DES to prox RCA and stent to distal RCA in 2012. No s/s of ischemia.  4. PSVT - quiescent. continue amio 200 daily. Check TFTs, LFTs next lab draw from [facility]       Follow up in 6 weeks.  Siyana Erney NP-C  3:15 PM

## 2015-04-13 NOTE — Patient Instructions (Signed)
INCREASE Hydralazine to 25 mg three times a day.  DECREASE Lasix to 40 mg daily.  FOLLOW UP in 6 weeks.

## 2015-04-18 ENCOUNTER — Ambulatory Visit (INDEPENDENT_AMBULATORY_CARE_PROVIDER_SITE_OTHER): Payer: BLUE CROSS/BLUE SHIELD | Admitting: *Deleted

## 2015-04-18 DIAGNOSIS — I429 Cardiomyopathy, unspecified: Secondary | ICD-10-CM

## 2015-04-18 NOTE — Progress Notes (Signed)
Remote ICD transmission.   

## 2015-04-20 LAB — MDC_IDC_ENUM_SESS_TYPE_REMOTE
Battery Voltage: 2.65 V
Brady Statistic RV Percent Paced: 0 %
HIGH POWER IMPEDANCE MEASURED VALUE: 36 Ohm
Lead Channel Impedance Value: 392 Ohm
Lead Channel Sensing Intrinsic Amplitude: 9.9 mV
Lead Channel Setting Pacing Amplitude: 2 V
Lead Channel Setting Pacing Pulse Width: 0.4 ms
MDC IDC SESS DTM: 20160418134800
MDC IDC SET LEADCHNL RV SENSING SENSITIVITY: 0.3 mV
MDC IDC SET ZONE DETECTION INTERVAL: 300 ms
Zone Setting Detection Interval: 240 ms
Zone Setting Detection Interval: 330 ms

## 2015-04-21 ENCOUNTER — Telehealth (HOSPITAL_COMMUNITY): Payer: Self-pay | Admitting: Cardiology

## 2015-04-21 NOTE — Telephone Encounter (Signed)
Andrea [name] called with concerns regarding digoxin Reports medication bottle was in the pts home however med was no longer on the pts medication list (AVS 04/13/15)   Per discharge summary 03/28/15 Digoxin was discontinued and not restarted as of 04/21/15  Detailed message left for [facility]

## 2015-04-25 ENCOUNTER — Encounter: Payer: Self-pay | Admitting: Cardiology

## 2015-04-26 ENCOUNTER — Encounter: Payer: Self-pay | Admitting: Internal Medicine

## 2015-04-28 ENCOUNTER — Encounter: Payer: Self-pay | Admitting: Internal Medicine

## 2015-05-09 ENCOUNTER — Encounter: Payer: Self-pay | Admitting: Internal Medicine

## 2015-05-16 ENCOUNTER — Emergency Department (HOSPITAL_COMMUNITY)
Admission: EM | Admit: 2015-05-16 | Discharge: 2015-05-16 | Disposition: A | Discharge status: Home / Self Care | Payer: BLUE CROSS/BLUE SHIELD | Attending: Emergency Medicine | Admitting: Emergency Medicine

## 2015-05-16 ENCOUNTER — Telehealth (HOSPITAL_COMMUNITY): Payer: Self-pay

## 2015-05-16 ENCOUNTER — Encounter (HOSPITAL_COMMUNITY): Payer: Self-pay | Admitting: Nurse Practitioner

## 2015-05-16 DIAGNOSIS — I252 Old myocardial infarction: Secondary | ICD-10-CM | POA: Diagnosis not present

## 2015-05-16 DIAGNOSIS — Z79899 Other long term (current) drug therapy: Secondary | ICD-10-CM | POA: Diagnosis not present

## 2015-05-16 DIAGNOSIS — Z8673 Personal history of transient ischemic attack (TIA), and cerebral infarction without residual deficits: Secondary | ICD-10-CM | POA: Insufficient documentation

## 2015-05-16 DIAGNOSIS — J45909 Unspecified asthma, uncomplicated: Secondary | ICD-10-CM | POA: Diagnosis not present

## 2015-05-16 DIAGNOSIS — E119 Type 2 diabetes mellitus without complications: Secondary | ICD-10-CM | POA: Diagnosis not present

## 2015-05-16 DIAGNOSIS — Z87891 Personal history of nicotine dependence: Secondary | ICD-10-CM | POA: Diagnosis not present

## 2015-05-16 DIAGNOSIS — R112 Nausea with vomiting, unspecified: Secondary | ICD-10-CM | POA: Insufficient documentation

## 2015-05-16 DIAGNOSIS — G43909 Migraine, unspecified, not intractable, without status migrainosus: Secondary | ICD-10-CM | POA: Diagnosis not present

## 2015-05-16 DIAGNOSIS — I129 Hypertensive chronic kidney disease with stage 1 through stage 4 chronic kidney disease, or unspecified chronic kidney disease: Secondary | ICD-10-CM | POA: Insufficient documentation

## 2015-05-16 DIAGNOSIS — I25119 Atherosclerotic heart disease of native coronary artery with unspecified angina pectoris: Secondary | ICD-10-CM | POA: Diagnosis not present

## 2015-05-16 DIAGNOSIS — Z95810 Presence of automatic (implantable) cardiac defibrillator: Secondary | ICD-10-CM | POA: Insufficient documentation

## 2015-05-16 DIAGNOSIS — M179 Osteoarthritis of knee, unspecified: Secondary | ICD-10-CM | POA: Insufficient documentation

## 2015-05-16 DIAGNOSIS — R109 Unspecified abdominal pain: Secondary | ICD-10-CM | POA: Diagnosis present

## 2015-05-16 DIAGNOSIS — Z87448 Personal history of other diseases of urinary system: Secondary | ICD-10-CM | POA: Insufficient documentation

## 2015-05-16 DIAGNOSIS — M545 Low back pain: Secondary | ICD-10-CM

## 2015-05-16 DIAGNOSIS — Z9861 Coronary angioplasty status: Secondary | ICD-10-CM | POA: Diagnosis not present

## 2015-05-16 DIAGNOSIS — Z9889 Other specified postprocedural states: Secondary | ICD-10-CM | POA: Diagnosis not present

## 2015-05-16 DIAGNOSIS — F419 Anxiety disorder, unspecified: Secondary | ICD-10-CM | POA: Insufficient documentation

## 2015-05-16 DIAGNOSIS — E669 Obesity, unspecified: Secondary | ICD-10-CM | POA: Diagnosis not present

## 2015-05-16 DIAGNOSIS — K219 Gastro-esophageal reflux disease without esophagitis: Secondary | ICD-10-CM | POA: Insufficient documentation

## 2015-05-16 DIAGNOSIS — N183 Chronic kidney disease, stage 3 (moderate): Secondary | ICD-10-CM | POA: Insufficient documentation

## 2015-05-16 DIAGNOSIS — R011 Cardiac murmur, unspecified: Secondary | ICD-10-CM | POA: Insufficient documentation

## 2015-05-16 DIAGNOSIS — D649 Anemia, unspecified: Secondary | ICD-10-CM | POA: Diagnosis not present

## 2015-05-16 DIAGNOSIS — F3131 Bipolar disorder, current episode depressed, mild: Secondary | ICD-10-CM | POA: Insufficient documentation

## 2015-05-16 DIAGNOSIS — I5022 Chronic systolic (congestive) heart failure: Secondary | ICD-10-CM | POA: Diagnosis not present

## 2015-05-16 LAB — CBC WITH DIFFERENTIAL/PLATELET
BASOS PCT: 0 % (ref 0–1)
Basophils Absolute: 0 10*3/uL (ref 0.0–0.1)
EOS PCT: 2 % (ref 0–5)
Eosinophils Absolute: 0.1 10*3/uL (ref 0.0–0.7)
HEMATOCRIT: 37.7 % (ref 36.0–46.0)
HEMOGLOBIN: 12.4 g/dL (ref 12.0–15.0)
LYMPHS ABS: 0.9 10*3/uL (ref 0.7–4.0)
Lymphocytes Relative: 16 % (ref 12–46)
MCH: 22.2 pg — ABNORMAL LOW (ref 26.0–34.0)
MCHC: 32.9 g/dL (ref 30.0–36.0)
MCV: 67.6 fL — AB (ref 78.0–100.0)
MONOS PCT: 6 % (ref 3–12)
Monocytes Absolute: 0.3 10*3/uL (ref 0.1–1.0)
Neutro Abs: 4.2 10*3/uL (ref 1.7–7.7)
Neutrophils Relative %: 76 % (ref 43–77)
Platelets: 190 10*3/uL (ref 150–400)
RBC: 5.58 MIL/uL — AB (ref 3.87–5.11)
RDW: 22.3 % — ABNORMAL HIGH (ref 11.5–15.5)
WBC: 5.5 10*3/uL (ref 4.0–10.5)

## 2015-05-16 LAB — COMPREHENSIVE METABOLIC PANEL
ALT: 11 U/L — ABNORMAL LOW (ref 14–54)
AST: 21 U/L (ref 15–41)
Albumin: 3.8 g/dL (ref 3.5–5.0)
Alkaline Phosphatase: 67 U/L (ref 38–126)
Anion gap: 14 (ref 5–15)
BUN: 62 mg/dL — AB (ref 6–20)
CALCIUM: 9.9 mg/dL (ref 8.9–10.3)
CO2: 22 mmol/L (ref 22–32)
CREATININE: 2.69 mg/dL — AB (ref 0.44–1.00)
Chloride: 98 mmol/L — ABNORMAL LOW (ref 101–111)
GFR calc Af Amer: 21 mL/min — ABNORMAL LOW (ref >60)
GFR calc non Af Amer: 18 mL/min — ABNORMAL LOW (ref >60)
GLUCOSE: 95 mg/dL (ref 65–99)
Potassium: 4.4 mmol/L (ref 3.5–5.1)
Sodium: 134 mmol/L — ABNORMAL LOW (ref 135–145)
TOTAL PROTEIN: 8.7 g/dL — AB (ref 6.5–8.1)
Total Bilirubin: 1.2 mg/dL (ref 0.3–1.2)

## 2015-05-16 LAB — URINALYSIS, ROUTINE W REFLEX MICROSCOPIC
BILIRUBIN URINE: NEGATIVE
Glucose, UA: NEGATIVE mg/dL
HGB URINE DIPSTICK: NEGATIVE
KETONES UR: NEGATIVE mg/dL
Nitrite: NEGATIVE
Protein, ur: NEGATIVE mg/dL
Specific Gravity, Urine: 1.014 (ref 1.005–1.030)
Urobilinogen, UA: 0.2 mg/dL (ref 0.0–1.0)
pH: 6 (ref 5.0–8.0)

## 2015-05-16 LAB — URINE MICROSCOPIC-ADD ON

## 2015-05-16 MED ORDER — TRAMADOL-ACETAMINOPHEN 37.5-325 MG PO TABS: 1.0000 | ORAL_TABLET | Freq: Four times a day (QID) | ORAL | Status: AC | PRN

## 2015-05-16 NOTE — ED Notes (Signed)
PA at bedside.

## 2015-05-16 NOTE — ED Notes (Signed)
Pt c/o abd pain since Friday. She denies n/v/bowel,bladder changes. Pain increased with movement. Nothing decreases pain. Pain is constant

## 2015-05-16 NOTE — ED Provider Notes (Signed)
CSN: [id number]     Arrival date & time 05/16/15  1733 History   First MD Initiated Contact with Patient 05/16/15 2118     Chief Complaint  Patient presents with  . Abdominal Pain     (Consider location/radiation/quality/duration/timing/severity/associated sxs/prior Treatment) HPI Andrea [name] is a 64 y.o. female with multiple medical problems, presents to emergency department complaining of back and abdominal pain. Patient states her symptoms started 3 days ago. States pain is mainly in the bilateral flank, radiates across and around abdomen. Pain is dull, constant, at times sharp. States pain is worse with laying down flat and with movement. She denies any urinary symptoms. States she has had some nausea and vomiting. She states when she eats she has early satiety and associated nausea. She denies any changes in her bowels. No history of similar pain. She denies any new chest pain or shortness of breath.  Past Medical History  Diagnosis Date  . Chronic systolic heart failure   . Hypertension   . Stroke 02/2010    left brain CVA? recurrent TIA's treated with TPA  . Dyslipidemia   . Premature ventricular contractions (PVCs) (VPCs)   . GERD (gastroesophageal reflux disease)   . Coronary artery disease     a. DES-dRCA 11/2010. b. DES-pRCA 04/2011 (in an area free of disease on prior cath). c. s/p DES-mRCA 03/2013 for NSTEMI.  . Ischemic cardiomyopathy     a. Hx of medtronic ICD. b. EF 15-20% by cath 03/2013.  . Pulmonary hypertension   . Gout   . Obesity   . Thrombocytopenia   . Depression   . Hypotension     a. Admission 09/2012 for hypotn & ARF. b. Meds held 03/2013 due to hypotension.  . Acute renal insufficiency     a. 09/2012. b. Also noted post-cath 03/2013.  . NSTEMI (non-ST elevated myocardial infarction) 03/2013  . Anginal pain   . Asthma   . Microcytic anemia     a. Noted 03/2013 on labs, iron studies WNL.  Marland Kitchen Anxiety   . CHF (congestive heart failure)   . CKD  (chronic kidney disease) stage 3, GFR 30-59 ml/min   . HTN (hypertension)   . Primary pulmonary HTN   . Automatic implantable cardiac defibrillator - Medtronic 08/24/2013  . Heart murmur   . Type II diabetes mellitus   . Migraine headache     "no pain; aura/visual problems only; at least 2-3X/month" (01/18/2014)  . Arthritis     "knees" (01/18/2014)  . DJD (degenerative joint disease)   . Bipolar disorder    Past Surgical History  Procedure Laterality Date  . Mass excision Left     hip  . Cervical polypectomy  1970's  . US echocardiography  2011  . Tonsillectomy  1960's?  . Right oophorectomy  1980's  . Dilation and curettage of uterus  1970's  . Ostectomy metatarsal Left 1993    "5th toe; from rickets" (05/07/2013)  . Implantable cardioverter defibrillator implant  ~ 2008  . Coronary angioplasty with stent placement  2000's    "2 stents" (05/07/2013)  . Coronary angioplasty with stent placement  03/2013    PCI to RCA/notes 05/05/2013; "makes total of 3" (05/07/2013)  . Left heart catheterization with coronary angiogram N/A 04/27/2013    Procedure: LEFT HEART CATHETERIZATION WITH CORONARY ANGIOGRAM;  Surgeon: Kathleene Hazel, MD;  Location: [facility] CATH LAB;  Service: Cardiovascular;  Laterality: N/A;  . Percutaneous coronary stent intervention (pci-s)  04/27/2013  Procedure: PERCUTANEOUS CORONARY STENT INTERVENTION (PCI-S);  Surgeon: Kathleene Hazel, MD;  Location: [facility] CATH LAB;  Service: Cardiovascular;;  Mid RCA   . Left and right heart catheterization with coronary angiogram N/A 04/12/2014    Procedure: LEFT AND RIGHT HEART CATHETERIZATION WITH CORONARY ANGIOGRAM;  Surgeon: Dolores Patty, MD;  Location: [facility] CATH LAB;  Service: Cardiovascular;  Laterality: N/A;   Family History  Problem Relation Age of Onset  . Heart failure Mother   . Heart attack Father   . Heart disease Brother    History  Substance Use Topics  . Smoking status: Former Smoker -- 0.05 packs/day for 5  years    Types: Cigarettes    Quit date: 04/23/1982  . Smokeless tobacco: Never Used  . Alcohol Use: No   OB History    No data available     Review of Systems  Constitutional: Negative for fever and chills.  Respiratory: Negative for cough, chest tightness and shortness of breath.   Cardiovascular: Negative for chest pain, palpitations and leg swelling.  Gastrointestinal: Positive for nausea, vomiting and abdominal pain. Negative for diarrhea.  Genitourinary: Positive for flank pain. Negative for dysuria, vaginal bleeding, vaginal discharge, vaginal pain and pelvic pain.  Musculoskeletal: Positive for back pain. Negative for myalgias, arthralgias, neck pain and neck stiffness.  Skin: Negative for rash.  Neurological: Negative for dizziness, weakness and headaches.  All other systems reviewed and are negative.     Allergies  Aspirin; Brilinta; Percocet; Darvon; Diovan; Imitrex; Licorice flavor; Nsaids; and Other  Home Medications   Prior to Admission medications   Medication Sig Start Date End Date Taking? Authorizing Provider  acetaminophen (TYLENOL) 500 MG tablet Take 500 mg by mouth every 6 (six) hours as needed for mild pain.    Historical Provider, MD  calcitRIOL (ROCALTROL) 0.25 MCG capsule Take 1 capsule by mouth daily. 01/07/15   Historical Provider, MD  diclofenac sodium (VOLTAREN) 1 % GEL Apply 2 g topically 4 (four) times daily as needed (pain).     Historical Provider, MD  DULoxetine (CYMBALTA) 60 MG capsule Take 60 mg by mouth daily.    Historical Provider, MD  ferrous sulfate 325 (65 FE) MG tablet Take 1 tablet (325 mg total) by mouth 3 (three) times daily with meals. 03/26/15   Renae Fickle, MD  furosemide (LASIX) 40 MG tablet Take 1 tablet (40 mg total) by mouth daily. 04/13/15   Amy D Filbert Schilder, NP  gabapentin (NEURONTIN) 100 MG capsule Take 200 mg by mouth at bedtime.    Historical Provider, MD  hydrALAZINE (APRESOLINE) 25 MG tablet Take 1 tablet (25 mg total) by  mouth 3 (three) times daily. 04/13/15   Amy D Clegg, NP  magnesium oxide (MAG-OX) 400 (241.3 MG) MG tablet Take 1 tablet (400 mg total) by mouth 3 (three) times daily. 08/09/14   Dolores Patty, MD  milrinone [facility]) 20 MG/100ML SOLN infusion Inject 30.225 mcg/min into the vein continuous. 04/12/14   Aundria Rud, NP  nitroGLYCERIN (NITROSTAT) 0.4 MG SL tablet Place 1 tablet (0.4 mg total) under the tongue every 5 (five) minutes as needed for chest pain (MAX 3 TABLETS IN 15 MINUTES). 01/17/15   Marinus Maw, MD  ondansetron (ZOFRAN ODT) 8 MG disintegrating tablet Take 1 tablet (8 mg total) by mouth every 8 (eight) hours as needed for nausea or vomiting. 03/16/15   Marisa Severin, MD  pantoprazole (PROTONIX) 40 MG tablet Take 1 tablet by mouth daily. 01/07/15  Historical Provider, MD  polyethylene glycol (MIRALAX / GLYCOLAX) packet Take 17 g by mouth daily as needed for mild constipation.    Historical Provider, MD  ranitidine (ZANTAC) 300 MG tablet Take 1 tablet by mouth daily. 12/20/14   Historical Provider, MD  rOPINIRole (REQUIP) 2 MG tablet Take 0.5 tablets by mouth at bedtime. 12/20/14   Historical Provider, MD  zolpidem (AMBIEN) 5 MG tablet Take 1 tablet by mouth at bedtime. 12/20/14   Historical Provider, MD   BP 113/87 mmHg  Pulse 102  Temp(Src) 97.8 F (36.6 C) (Oral)  Resp 25  SpO2 99% Physical Exam  Constitutional: She is oriented to person, place, and time. She appears well-developed and well-nourished. No distress.  HENT:  Head: Normocephalic.  Eyes: Conjunctivae are normal.  Neck: Neck supple.  Cardiovascular: Normal rate, regular rhythm and normal heart sounds.   Pulmonary/Chest: Effort normal and breath sounds normal. No respiratory distress. She has no wheezes. She has no rales.  Abdominal: Soft. Bowel sounds are normal. She exhibits no distension. There is tenderness. There is no rebound.  Bilateral CVA tenderness. Diffuse abdominal tenderness.  Musculoskeletal: She  exhibits no edema.  Admitted to palpation over bilateral paraspinal muscles of lumbar spine.  Neurological: She is alert and oriented to person, place, and time.  Skin: Skin is warm and dry.  Psychiatric: She has a normal mood and affect. Her behavior is normal.  Nursing note and vitals reviewed.   ED Course  Procedures (including critical care time) Labs Review Labs Reviewed  CBC WITH DIFFERENTIAL/PLATELET - Abnormal; Notable for the following:    RBC 5.58 (*)    MCV 67.6 (*)    MCH 22.2 (*)    RDW 22.3 (*)    All other components within normal limits  COMPREHENSIVE METABOLIC PANEL - Abnormal; Notable for the following:    Sodium 134 (*)    Chloride 98 (*)    BUN 62 (*)    Creatinine, Ser 2.69 (*)    Total Protein 8.7 (*)    ALT 11 (*)    GFR calc non Af Amer 18 (*)    GFR calc Af Amer 21 (*)    All other components within normal limits  URINALYSIS, ROUTINE W REFLEX MICROSCOPIC - Abnormal; Notable for the following:    Leukocytes, UA TRACE (*)    All other components within normal limits  URINE MICROSCOPIC-ADD ON    Imaging Review No results found.   EKG Interpretation None      MDM   Final diagnoses:  Low back pain without sciatica, unspecified back pain laterality     patient emergency department with severe back and abdominal pain which is new for her. She has multiple medical problems, including miliodorone drip. She denies any new shortness of breath or chest pain. She denies problems with constipation. No urinary symptoms. Will get labs, urinalysis.  Patient's labs are at her baseline, discussed with Dr. Effie Shy who evaluated patient as well. On his exam her abdomen is benign. He suspects the pain is musculoskeletal or multifactorial. Patient does have pretty significant scoliosis, pain is worse with movement and palpation. Will start on Ultracet, follow-up with primary care doctor. Return precautions discussed with her.  Filed Vitals:   05/16/15 2145  05/16/15 2215 05/16/15 2230 05/16/15 2330  BP: 113/87 122/80 123/83 111/74  Pulse: 102 114    Temp:      TempSrc:      Resp: 25 24    SpO2: 99% 98%  97% 95%     Jaynie Crumble, PA-C 05/17/15 0058  Mancel Bale, MD 05/18/15 [phone]

## 2015-05-16 NOTE — ED Provider Notes (Signed)
  Face-to-face evaluation   History: She has had abdominal pain radiating to back bilaterally which is worse with attempts at moving such as getting out of bed, for 3 days. She is not taking anything for it. She does not have anorexia, nausea, vomiting, diarrhea. She denies fever.  Physical exam: Alert, frail patient who appears older than her stated age. Abdomen soft, nontender to palpation. Back has marked kyphoscoliosis. The back is nontender to palpation.  Assessment: Multifactorial abdominal and back pain. No evidence for acute infectious process, metabolic instability, or suspected impending vascular collapse. Primary suspected source of pain is musculoskeletal.  Medical screening examination/treatment/procedure(s) were conducted as a shared visit with non-physician practitioner(s) and myself.  I personally evaluated the patient during the encounter  Mancel Bale, MD 05/18/15 [phone]

## 2015-05-16 NOTE — Discharge Instructions (Signed)
ultracet for pain. Try heating pads. Follow up with primary care. Pain is most likely musculoskeletal. Return if worsening symptoms.   Back Pain, Adult Low back pain is very common. About 1 in 5 people have back pain.The cause of low back pain is rarely dangerous. The pain often gets better over time.About half of people with a sudden onset of back pain feel better in just 2 weeks. About 8 in 10 people feel better by 6 weeks.  CAUSES Some common causes of back pain include:  Strain of the muscles or ligaments supporting the spine.  Wear and tear (degeneration) of the spinal discs.  Arthritis.  Direct injury to the back. DIAGNOSIS Most of the time, the direct cause of low back pain is not known.However, back pain can be treated effectively even when the exact cause of the pain is unknown.Answering your caregiver's questions about your overall health and symptoms is one of the most accurate ways to make sure the cause of your pain is not dangerous. If your caregiver needs more information, he or she may order lab work or imaging tests (X-rays or MRIs).However, even if imaging tests show changes in your back, this usually does not require surgery. HOME CARE INSTRUCTIONS For many people, back pain returns.Since low back pain is rarely dangerous, it is often a condition that people can learn to [facility] their own.   Remain active. It is stressful on the back to sit or stand in one place. Do not sit, drive, or stand in one place for more than 30 minutes at a time. Take short walks on level surfaces as soon as pain allows.Try to increase the length of time you walk each day.  Do not stay in bed.Resting more than 1 or 2 days can delay your recovery.  Do not avoid exercise or work.Your body is made to move.It is not dangerous to be active, even though your back may hurt.Your back will likely heal faster if you return to being active before your pain is gone.  Pay attention to your body  when you bend and lift. Many people have less discomfortwhen lifting if they bend their knees, keep the load close to their bodies,and avoid twisting. Often, the most comfortable positions are those that put less stress on your recovering back.  Find a comfortable position to sleep. Use a firm mattress and lie on your side with your knees slightly bent. If you lie on your back, put a pillow under your knees.  Only take over-the-counter or prescription medicines as directed by your caregiver. Over-the-counter medicines to reduce pain and inflammation are often the most helpful.Your caregiver may prescribe muscle relaxant drugs.These medicines help dull your pain so you can more quickly return to your normal activities and healthy exercise.  Put ice on the injured area.  Put ice in a plastic bag.  Place a towel between your skin and the bag.  Leave the ice on for 15-20 minutes, 03-04 times a day for the first 2 to 3 days. After that, ice and heat may be alternated to reduce pain and spasms.  Ask your caregiver about trying back exercises and gentle massage. This may be of some benefit.  Avoid feeling anxious or stressed.Stress increases muscle tension and can worsen back pain.It is important to recognize when you are anxious or stressed and learn ways to manage it.Exercise is a great option. SEEK MEDICAL CARE IF:  You have pain that is not relieved with rest or medicine.  You  have pain that does not improve in 1 week.  You have new symptoms.  You are generally not feeling well. SEEK IMMEDIATE MEDICAL CARE IF:   You have pain that radiates from your back into your legs.  You develop new bowel or bladder control problems.  You have unusual weakness or numbness in your arms or legs.  You develop nausea or vomiting.  You develop abdominal pain.  You feel faint. Document Released: 12/17/2005 Document Revised: 06/17/2012 Document Reviewed: 04/20/2014 [facility] Patient  Information 2015 [employer], [place]. This information is not intended to replace advice given to you by your health care provider. Make sure you discuss any questions you have with your health care provider.

## 2015-05-16 NOTE — Telephone Encounter (Signed)
Nurse with [facility] called worried about patient after a home visit.  States patinet is very fatigued, SOB, weight up a couple of lbs, dizziness, abd girth up, and bilateral feet edema.  States she has taken an extra 40mg  lasix tablet (takes 40mg  once daily),  but no result in increased urinary output or resolving of s/s.   Vitals 108/78, HR 100 (slightly irregular), 98% RA, lungs CTA bilaterally. Patient has been c/o back/abdominal pain and N/V for some time now, was referred to GI specialist Dr. Randa Evens two weeks ago with tests, which all came back normal/negative for anything concerning.  She will be f/u with Dr. Randa Evens again [place] Friday.  Also seeing her PCP this afternoon at 4 to see what may be determined of abd pain and N/V from their standpoint. Patient will be due for a 6 week f/u with [place] next week.  I have scheduled patient to be seen tomorrow afternoon in our clinic to evaluate her from cardiac/heart failure standpoint as she is having fatigue, dizziness, increased abd girth, and swelling in feet despite extra lasix. Patient aware and agreeable to plan.  Patient advised to call 911 to be evaluated in ED if s/s become worse before her appointment with [place] tomorrow.  Ave Filter

## 2015-05-17 ENCOUNTER — Encounter (HOSPITAL_COMMUNITY): Payer: BLUE CROSS/BLUE SHIELD

## 2015-05-24 ENCOUNTER — Telehealth (HOSPITAL_COMMUNITY): Payer: Self-pay | Admitting: *Deleted

## 2015-05-24 ENCOUNTER — Encounter: Payer: Self-pay | Admitting: Internal Medicine

## 2015-05-24 NOTE — Telephone Encounter (Signed)
Patty with [facility] said pt isnt feeling well her O2 stats are lower. She spoke with megan on 5/16. Aundra Millet made an appt for pt on 5/17 pt cancelled and rescheduled to 6/7 which is our ext available. Called and advised pt to keep her next appt and if she is feeling bad to please call 9-1-1 and got to the ER. (per Aundra Millet)   See Megans phone note below: Nurse with [facility] called worried about patient after a home visit. States patinet is very fatigued, SOB, weight up a couple of lbs, dizziness, abd girth up, and bilateral feet edema. States she has taken an extra 40mg  lasix tablet (takes 40mg  once daily), but no result in increased urinary output or resolving of s/s.  Vitals 108/78, HR 100 (slightly irregular), 98% RA, lungs CTA bilaterally. Patient has been c/o back/abdominal pain and N/V for some time now, was referred to GI specialist Dr. Randa Evens two weeks ago with tests, which all came back normal/negative for anything concerning. She will be f/u with Dr. Randa Evens again [place] Friday. Also seeing her PCP this afternoon at 4 to see what may be determined of abd pain and N/V from their standpoint. Patient will be due for a 6 week f/u with [place] next week. I have scheduled patient to be seen tomorrow afternoon in our clinic to evaluate her from cardiac/heart failure standpoint as she is having fatigue, dizziness, increased abd girth, and swelling in feet despite extra lasix. Patient aware and agreeable to plan. Patient advised to call 911 to be evaluated in ED if s/s become worse before her appointment with [place] tomorrow.  Ave Filter

## 2015-05-27 ENCOUNTER — Encounter: Payer: Self-pay | Admitting: Internal Medicine

## 2015-05-31 ENCOUNTER — Emergency Department (HOSPITAL_COMMUNITY)
Admission: EM | Admit: 2015-05-31 | Discharge: 2015-05-31 | Disposition: A | Discharge status: Home / Self Care | Payer: BLUE CROSS/BLUE SHIELD | Attending: Emergency Medicine | Admitting: Emergency Medicine

## 2015-05-31 ENCOUNTER — Telehealth (HOSPITAL_COMMUNITY): Payer: Self-pay | Admitting: *Deleted

## 2015-05-31 ENCOUNTER — Encounter (HOSPITAL_COMMUNITY): Payer: Self-pay | Admitting: Emergency Medicine

## 2015-05-31 DIAGNOSIS — Z87891 Personal history of nicotine dependence: Secondary | ICD-10-CM | POA: Insufficient documentation

## 2015-05-31 DIAGNOSIS — K219 Gastro-esophageal reflux disease without esophagitis: Secondary | ICD-10-CM | POA: Diagnosis not present

## 2015-05-31 DIAGNOSIS — Z95810 Presence of automatic (implantable) cardiac defibrillator: Secondary | ICD-10-CM | POA: Diagnosis not present

## 2015-05-31 DIAGNOSIS — R7989 Other specified abnormal findings of blood chemistry: Secondary | ICD-10-CM | POA: Diagnosis present

## 2015-05-31 DIAGNOSIS — F329 Major depressive disorder, single episode, unspecified: Secondary | ICD-10-CM | POA: Insufficient documentation

## 2015-05-31 DIAGNOSIS — I5022 Chronic systolic (congestive) heart failure: Secondary | ICD-10-CM | POA: Insufficient documentation

## 2015-05-31 DIAGNOSIS — R011 Cardiac murmur, unspecified: Secondary | ICD-10-CM | POA: Insufficient documentation

## 2015-05-31 DIAGNOSIS — I25119 Atherosclerotic heart disease of native coronary artery with unspecified angina pectoris: Secondary | ICD-10-CM | POA: Insufficient documentation

## 2015-05-31 DIAGNOSIS — I129 Hypertensive chronic kidney disease with stage 1 through stage 4 chronic kidney disease, or unspecified chronic kidney disease: Secondary | ICD-10-CM | POA: Diagnosis not present

## 2015-05-31 DIAGNOSIS — M1990 Unspecified osteoarthritis, unspecified site: Secondary | ICD-10-CM | POA: Diagnosis not present

## 2015-05-31 DIAGNOSIS — Z79899 Other long term (current) drug therapy: Secondary | ICD-10-CM | POA: Diagnosis not present

## 2015-05-31 DIAGNOSIS — J45909 Unspecified asthma, uncomplicated: Secondary | ICD-10-CM | POA: Insufficient documentation

## 2015-05-31 DIAGNOSIS — Z8673 Personal history of transient ischemic attack (TIA), and cerebral infarction without residual deficits: Secondary | ICD-10-CM | POA: Insufficient documentation

## 2015-05-31 DIAGNOSIS — N183 Chronic kidney disease, stage 3 (moderate): Secondary | ICD-10-CM | POA: Insufficient documentation

## 2015-05-31 DIAGNOSIS — Z955 Presence of coronary angioplasty implant and graft: Secondary | ICD-10-CM | POA: Insufficient documentation

## 2015-05-31 DIAGNOSIS — G43909 Migraine, unspecified, not intractable, without status migrainosus: Secondary | ICD-10-CM | POA: Diagnosis not present

## 2015-05-31 DIAGNOSIS — Z87448 Personal history of other diseases of urinary system: Secondary | ICD-10-CM | POA: Insufficient documentation

## 2015-05-31 DIAGNOSIS — I509 Heart failure, unspecified: Secondary | ICD-10-CM

## 2015-05-31 DIAGNOSIS — E669 Obesity, unspecified: Secondary | ICD-10-CM | POA: Insufficient documentation

## 2015-05-31 DIAGNOSIS — D649 Anemia, unspecified: Secondary | ICD-10-CM | POA: Insufficient documentation

## 2015-05-31 LAB — BASIC METABOLIC PANEL
ANION GAP: 13 (ref 5–15)
BUN: 61 mg/dL — ABNORMAL HIGH (ref 6–20)
CALCIUM: 9.6 mg/dL (ref 8.9–10.3)
CO2: 22 mmol/L (ref 22–32)
Chloride: 101 mmol/L (ref 101–111)
Creatinine, Ser: 2.86 mg/dL — ABNORMAL HIGH (ref 0.44–1.00)
GFR calc Af Amer: 19 mL/min — ABNORMAL LOW (ref >60)
GFR, EST NON AFRICAN AMERICAN: 16 mL/min — AB (ref >60)
GLUCOSE: 90 mg/dL (ref 65–99)
Potassium: 4.4 mmol/L (ref 3.5–5.1)
SODIUM: 136 mmol/L (ref 135–145)

## 2015-05-31 LAB — I-STAT CHEM 8, ED
BUN: 57 mg/dL — ABNORMAL HIGH (ref 6–20)
CALCIUM ION: 1.22 mmol/L (ref 1.13–1.30)
CHLORIDE: 103 mmol/L (ref 101–111)
Creatinine, Ser: 2.9 mg/dL — ABNORMAL HIGH (ref 0.44–1.00)
GLUCOSE: 88 mg/dL (ref 65–99)
HCT: 42 % (ref 36.0–46.0)
HEMOGLOBIN: 14.3 g/dL (ref 12.0–15.0)
Potassium: 4.4 mmol/L (ref 3.5–5.1)
Sodium: 137 mmol/L (ref 135–145)
TCO2: 20 mmol/L (ref 0–100)

## 2015-05-31 NOTE — ED Provider Notes (Signed)
CSN: [id number]     Arrival date & time 05/31/15  1831 History   First MD Initiated Contact with Patient 05/31/15 1927     Chief Complaint  Patient presents with  . Abnormal Lab   Andrea [name] is a 64 y.o. female with a history of CHF, CKD, hypertension, coronary artery disease, and diabetes who presents to the emergency department after being sent by her pulmonary care provider's office who reported she had an elevated potassium on her blood work drawn yesterday. Patient reports her potassium was over 7. The patient is directed to the emergency department. The patient reports her blood was drawn yesterday by her home health nurse who comes to change her dressing on her central line for which has for milrinone infusion. Patient reports she also takes Lasix daily. The patient denies any potassium supplementation. Patient does report she has been fatigued chronically for the past several months but this has not worsened or changed recently. Patient also reports bilateral lower extremity edema that has been ongoing for many months and has not worsened or changed. Patient does report some dyspnea on exertion that is chronic and has not worsened or changed. The patient reports she would not be in the emergency department if it were not for her doctor sending her with her elevated potassium. The patient denies chest pain, palpitations, shortness of breath, abdominal pain, nausea, vomiting, diarrhea, or changes to her medications. The patient denies any urinary symptoms or changes to her urination. The patient reports that her heart rate is always slightly elevated.  (Consider location/radiation/quality/duration/timing/severity/associated sxs/prior Treatment) HPI  Past Medical History  Diagnosis Date  . Chronic systolic heart failure   . Hypertension   . Stroke 02/2010    left brain CVA? recurrent TIA's treated with TPA  . Dyslipidemia   . Premature ventricular contractions (PVCs) (VPCs)   . GERD  (gastroesophageal reflux disease)   . Coronary artery disease     a. DES-dRCA 11/2010. b. DES-pRCA 04/2011 (in an area free of disease on prior cath). c. s/p DES-mRCA 03/2013 for NSTEMI.  . Ischemic cardiomyopathy     a. Hx of medtronic ICD. b. EF 15-20% by cath 03/2013.  . Pulmonary hypertension   . Gout   . Obesity   . Thrombocytopenia   . Depression   . Hypotension     a. Admission 09/2012 for hypotn & ARF. b. Meds held 03/2013 due to hypotension.  . Acute renal insufficiency     a. 09/2012. b. Also noted post-cath 03/2013.  . NSTEMI (non-ST elevated myocardial infarction) 03/2013  . Anginal pain   . Asthma   . Microcytic anemia     a. Noted 03/2013 on labs, iron studies WNL.  Marland Kitchen Anxiety   . CHF (congestive heart failure)   . CKD (chronic kidney disease) stage 3, GFR 30-59 ml/min   . HTN (hypertension)   . Primary pulmonary HTN   . Automatic implantable cardiac defibrillator - Medtronic 08/24/2013  . Heart murmur   . Type II diabetes mellitus   . Migraine headache     "no pain; aura/visual problems only; at least 2-3X/month" (01/18/2014)  . Arthritis     "knees" (01/18/2014)  . DJD (degenerative joint disease)   . Bipolar disorder    Past Surgical History  Procedure Laterality Date  . Mass excision Left     hip  . Cervical polypectomy  1970's  . US echocardiography  2011  . Tonsillectomy  1960's?  . Right oophorectomy  1980's  . Dilation and curettage of uterus  1970's  . Ostectomy metatarsal Left 1993    "5th toe; from rickets" (05/07/2013)  . Implantable cardioverter defibrillator implant  ~ 2008  . Coronary angioplasty with stent placement  2000's    "2 stents" (05/07/2013)  . Coronary angioplasty with stent placement  03/2013    PCI to RCA/notes 05/05/2013; "makes total of 3" (05/07/2013)  . Left heart catheterization with coronary angiogram N/A 04/27/2013    Procedure: LEFT HEART CATHETERIZATION WITH CORONARY ANGIOGRAM;  Surgeon: Kathleene Hazel, MD;  Location: [facility] CATH  LAB;  Service: Cardiovascular;  Laterality: N/A;  . Percutaneous coronary stent intervention (pci-s)  04/27/2013    Procedure: PERCUTANEOUS CORONARY STENT INTERVENTION (PCI-S);  Surgeon: Kathleene Hazel, MD;  Location: [facility] CATH LAB;  Service: Cardiovascular;;  Mid RCA   . Left and right heart catheterization with coronary angiogram N/A 04/12/2014    Procedure: LEFT AND RIGHT HEART CATHETERIZATION WITH CORONARY ANGIOGRAM;  Surgeon: Dolores Patty, MD;  Location: [facility] CATH LAB;  Service: Cardiovascular;  Laterality: N/A;   Family History  Problem Relation Age of Onset  . Heart failure Mother   . Heart attack Father   . Heart disease Brother    History  Substance Use Topics  . Smoking status: Former Smoker -- 0.05 packs/day for 5 years    Types: Cigarettes    Quit date: 04/23/1982  . Smokeless tobacco: Never Used  . Alcohol Use: No   OB History    No data available     Review of Systems  Constitutional: Positive for fatigue. Negative for fever and chills.  HENT: Negative for congestion and sore throat.   Eyes: Negative for visual disturbance.  Respiratory: Negative for cough, shortness of breath and wheezing.   Cardiovascular: Positive for leg swelling. Negative for chest pain and palpitations.  Gastrointestinal: Negative for nausea, vomiting, abdominal pain and diarrhea.  Genitourinary: Negative for dysuria and difficulty urinating.  Musculoskeletal: Negative for back pain and neck pain.  Skin: Negative for rash.  Neurological: Negative for syncope, weakness, light-headedness and headaches.      Allergies  Aspirin; Brilinta; Percocet; Darvon; Diovan; Imitrex; Licorice flavor; Nsaids; and Other  Home Medications   Prior to Admission medications   Medication Sig Start Date End Date Taking? Authorizing Provider  acetaminophen (TYLENOL) 500 MG tablet Take 500 mg by mouth every 6 (six) hours as needed for mild pain.    Historical Provider, MD  calcitRIOL (ROCALTROL)  0.25 MCG capsule Take 1 capsule by mouth daily. 01/07/15   Historical Provider, MD  diclofenac sodium (VOLTAREN) 1 % GEL Apply 2 g topically 4 (four) times daily as needed (pain).     Historical Provider, MD  DULoxetine (CYMBALTA) 60 MG capsule Take 60 mg by mouth daily.    Historical Provider, MD  ferrous sulfate 325 (65 FE) MG tablet Take 1 tablet (325 mg total) by mouth 3 (three) times daily with meals. 03/26/15   Renae Fickle, MD  furosemide (LASIX) 40 MG tablet Take 1 tablet (40 mg total) by mouth daily. 04/13/15   Amy D Filbert Schilder, NP  gabapentin (NEURONTIN) 100 MG capsule Take 200 mg by mouth at bedtime.    Historical Provider, MD  hydrALAZINE (APRESOLINE) 25 MG tablet Take 1 tablet (25 mg total) by mouth 3 (three) times daily. 04/13/15   Amy D Clegg, NP  magnesium oxide (MAG-OX) 400 (241.3 MG) MG tablet Take 1 tablet (400 mg total) by mouth 3 (three) times daily.  08/09/14   Dolores Patty, MD  milrinone [facility]) 20 MG/100ML SOLN infusion Inject 30.225 mcg/min into the vein continuous. 04/12/14   Aundria Rud, NP  nitroGLYCERIN (NITROSTAT) 0.4 MG SL tablet Place 1 tablet (0.4 mg total) under the tongue every 5 (five) minutes as needed for chest pain (MAX 3 TABLETS IN 15 MINUTES). 01/17/15   Marinus Maw, MD  ondansetron (ZOFRAN ODT) 8 MG disintegrating tablet Take 1 tablet (8 mg total) by mouth every 8 (eight) hours as needed for nausea or vomiting. 03/16/15   Marisa Severin, MD  pantoprazole (PROTONIX) 40 MG tablet Take 1 tablet by mouth daily. 01/07/15   Historical Provider, MD  polyethylene glycol (MIRALAX / GLYCOLAX) packet Take 17 g by mouth daily as needed for mild constipation.    Historical Provider, MD  ranitidine (ZANTAC) 300 MG tablet Take 1 tablet by mouth daily. 12/20/14   Historical Provider, MD  rOPINIRole (REQUIP) 2 MG tablet Take 0.5 tablets by mouth at bedtime. 12/20/14   Historical Provider, MD  traMADol-acetaminophen (ULTRACET) 37.5-325 MG per tablet Take 1 tablet by mouth every 6  (six) hours as needed. 05/16/15   Tatyana Kirichenko, PA-C  zolpidem (AMBIEN) 5 MG tablet Take 1 tablet by mouth at bedtime. 12/20/14   Historical Provider, MD   BP 110/71 mmHg  Pulse 117  Temp(Src) 97.9 F (36.6 C) (Oral)  Resp 18  Wt 179 lb (81.194 kg)  SpO2 96% Physical Exam  Constitutional: She is oriented to person, place, and time. She appears well-developed and well-nourished. No distress.  Nontoxic appearing.  HENT:  Head: Normocephalic and atraumatic.  Mouth/Throat: Oropharynx is clear and moist. No oropharyngeal exudate.  Eyes: Conjunctivae are normal. Pupils are equal, round, and reactive to light. Right eye exhibits no discharge. Left eye exhibits no discharge.  Neck: Normal range of motion. Neck supple. No JVD present. No tracheal deviation present.  Cardiovascular: Regular rhythm and intact distal pulses.  Exam reveals no gallop and no friction rub.   Heart rate is 112. Blowing systolic murmur noted on auscultation. Bilateral radial pulses are intact.  Pulmonary/Chest: Effort normal and breath sounds normal. No respiratory distress. She has no wheezes. She has no rales.  Lungs are clear to auscultation bilaterally. Port located in right chest wall and is free of erythema or discharge. Clean appearing.   Abdominal: Soft. Bowel sounds are normal. She exhibits no distension. There is no tenderness.  Musculoskeletal: She exhibits edema.  Bilateral lower extremity edema without tenderness.  Lymphadenopathy:    She has no cervical adenopathy.  Neurological: She is alert and oriented to person, place, and time. Coordination normal.  Skin: Skin is warm and dry. No rash noted. She is not diaphoretic. No erythema. No pallor.  Psychiatric: She has a normal mood and affect. Her behavior is normal.  Nursing note and vitals reviewed.   ED Course  Procedures (including critical care time) Labs Review Labs Reviewed  I-STAT CHEM 8, ED - Abnormal; Notable for the following:    BUN 57  (*)    Creatinine, Ser 2.90 (*)    All other components within normal limits  BASIC METABOLIC PANEL    Imaging Review No results found.   EKG Interpretation   Date/Time:  Tuesday May 31 2015 19:16:38 EDT Ventricular Rate:  115 PR Interval:  152 QRS Duration: 100 QT Interval:  302 QTC Calculation: 417 R Axis:   70 Text Interpretation:  Sinus tachycardia Possible Lateral infarct , age  undetermined Possible Inferior  infarct , age undetermined Abnormal ECG  Confirmed by [facility]  MD, CHRISTOPHER [phone]) on 05/31/2015 7:39:39 PM      Filed Vitals:   05/31/15 1918 05/31/15 1946  BP: 110/71   Pulse: 117   Temp: 97.9 F (36.6 C)   TempSrc: Oral   Resp: 18   Weight: 179 lb (81.194 kg)   SpO2: 94% 96%     MDM   Final diagnoses:  Chronic congestive heart failure, unspecified congestive heart failure type   This is a 64 year old female with history of CHF and heart murmur and diabetes who presents to the emergency department after being directed by her primary care physician for an elevated potassium from blood work that was drawn yesterday. The patient has no acute complaints today. The patient is on milrinone infusion and takes Lasix daily. She does not take supplemental potassium. The patient denies any changes to her medications. Patient denies any nausea, vomiting, diarrhea or changes to her urination. The patient reports her heart rate is chronically mildly elevated and this seems consistent with her previous visits. On exam the patient is afebrile and nontoxic appearing. She denies any chest pain, shortness of breath or palpitations. Patient reports she would not be here if it were not for her PCP sending her for her elevated potassium. No signs of elevated potassium on EKG. I-STAT Chem-8 reveals a potassium of 4.4. The patient's blood work was likely hemolyzed, giving a falsely elevated potassium. We'll discharge with follow-up by primary care provider. The patient is in  agreement with discharge and feels ready to go home. She has no complaints at discharge. I advised the patient to follow-up with their primary care provider this week. I advised the patient to return to the emergency department with new or worsening symptoms or new concerns. The patient verbalized understanding and agreement with plan.    This patient was discussed with Dr. Blinda Leatherwood who agrees with assessment and plan.    Everlene Farrier, PA-C 05/31/15 2012  Gilda Crease, MD 05/31/15 2016

## 2015-05-31 NOTE — ED Notes (Signed)
Pt reports her doctor told her to come to ER for potassium level over 7. Pt c/o fatigue. Pt on Milrinone.

## 2015-05-31 NOTE — Telephone Encounter (Signed)
Received call from [facility] that pt's labs are back adn K is over 7, I have not received the labs via fax yet and nurse does not have them in front of her either she was just notified by lab K is greater than 7 and not hemolyzed, called pt and advised K is very high she needs to report to ER, she is agreeable

## 2015-05-31 NOTE — Discharge Instructions (Signed)
You had a falsely elevated potassium level by your doctors office. In the emergency department your potassium was 4.4.    Heart Failure Heart failure is a condition in which the heart has trouble pumping blood. This means your heart does not pump blood efficiently for your body to work well. In some cases of heart failure, fluid may back up into your lungs or you may have swelling (edema) in your lower legs. Heart failure is usually a long-term (chronic) condition. It is important for you to take good care of yourself and follow your health care provider's treatment plan. CAUSES  Some health conditions can cause heart failure. Those health conditions include:  High blood pressure (hypertension). Hypertension causes the heart muscle to work harder than normal. When pressure in the blood vessels is high, the heart needs to pump (contract) with more force in order to circulate blood throughout the body. High blood pressure eventually causes the heart to become stiff and weak.  Coronary artery disease (CAD). CAD is the buildup of cholesterol and fat (plaque) in the arteries of the heart. The blockage in the arteries deprives the heart muscle of oxygen and blood. This can cause chest pain and may lead to a heart attack. High blood pressure can also contribute to CAD.  Heart attack (myocardial infarction). A heart attack occurs when one or more arteries in the heart become blocked. The loss of oxygen damages the muscle tissue of the heart. When this happens, part of the heart muscle dies. The injured tissue does not contract as well and weakens the heart's ability to pump blood.  Abnormal heart valves. When the heart valves do not open and close properly, it can cause heart failure. This makes the heart muscle pump harder to keep the blood flowing.  Heart muscle disease (cardiomyopathy or myocarditis). Heart muscle disease is damage to the heart muscle from a variety of causes. These can include drug or  alcohol abuse, infections, or unknown reasons. These can increase the risk of heart failure.  Lung disease. Lung disease makes the heart work harder because the lungs do not work properly. This can cause a strain on the heart, leading it to fail.  Diabetes. Diabetes increases the risk of heart failure. High blood sugar contributes to high fat (lipid) levels in the blood. Diabetes can also cause slow damage to tiny blood vessels that carry important nutrients to the heart muscle. When the heart does not get enough oxygen and food, it can cause the heart to become weak and stiff. This leads to a heart that does not contract efficiently.  Other conditions can contribute to heart failure. These include abnormal heart rhythms, thyroid problems, and low blood counts (anemia). Certain unhealthy behaviors can increase the risk of heart failure, including:  Being overweight.  Smoking or chewing tobacco.  Eating foods high in fat and cholesterol.  Abusing illicit drugs or alcohol.  Lacking physical activity. SYMPTOMS  Heart failure symptoms may vary and can be hard to detect. Symptoms may include:  Shortness of breath with activity, such as climbing stairs.  Persistent cough.  Swelling of the feet, ankles, legs, or abdomen.  Unexplained weight gain.  Difficulty breathing when lying flat (orthopnea).  Waking from sleep because of the need to sit up and get more air.  Rapid heartbeat.  Fatigue and loss of energy.  Feeling light-headed, dizzy, or close to fainting.  Loss of appetite.  Nausea.  Increased urination during the night (nocturia). DIAGNOSIS  A diagnosis  of heart failure is based on your history, symptoms, physical examination, and diagnostic tests. Diagnostic tests for heart failure may include:  Echocardiography.  Electrocardiography.  Chest X-ray.  Blood tests.  Exercise stress test.  Cardiac angiography.  Radionuclide scans. TREATMENT  Treatment is aimed  at managing the symptoms of heart failure. Medicines, behavioral changes, or surgical intervention may be necessary to treat heart failure.  Medicines to help treat heart failure may include:  Angiotensin-converting enzyme (ACE) inhibitors. This type of medicine blocks the effects of a blood protein called angiotensin-converting enzyme. ACE inhibitors relax (dilate) the blood vessels and help lower blood pressure.  Angiotensin receptor blockers (ARBs). This type of medicine blocks the actions of a blood protein called angiotensin. Angiotensin receptor blockers dilate the blood vessels and help lower blood pressure.  Water pills (diuretics). Diuretics cause the kidneys to remove salt and water from the blood. The extra fluid is removed through urination. This loss of extra fluid lowers the volume of blood the heart pumps.  Beta blockers. These prevent the heart from beating too fast and improve heart muscle strength.  Digitalis. This increases the force of the heartbeat.  Healthy behavior changes include:  Obtaining and maintaining a healthy weight.  Stopping smoking or chewing tobacco.  Eating heart-healthy foods.  Limiting or avoiding alcohol.  Stopping illicit drug use.  Physical activity as directed by your health care provider.  Surgical treatment for heart failure may include:  A procedure to open blocked arteries, repair damaged heart valves, or remove damaged heart muscle tissue.  A pacemaker to improve heart muscle function and control certain abnormal heart rhythms.  An internal cardioverter defibrillator to treat certain serious abnormal heart rhythms.  A left ventricular assist device (LVAD) to assist the pumping ability of the heart. HOME CARE INSTRUCTIONS   Take medicines only as directed by your health care provider. Medicines are important in reducing the workload of your heart, slowing the progression of heart failure, and improving your symptoms.  Do not stop  taking your medicine unless directed by your health care provider.  Do not skip any dose of medicine.  Refill your prescriptions before you run out of medicine. Your medicines are needed every day.  Engage in moderate physical activity if directed by your health care provider. Moderate physical activity can benefit some people. The elderly and people with severe heart failure should consult with a health care provider for physical activity recommendations.  Eat heart-healthy foods. Food choices should be free of trans fat and low in saturated fat, cholesterol, and salt (sodium). Healthy choices include fresh or frozen fruits and vegetables, fish, lean meats, legumes, fat-free or low-fat dairy products, and whole grain or high fiber foods. Talk to a dietitian to learn more about heart-healthy foods.  Limit sodium if directed by your health care provider. Sodium restriction may reduce symptoms of heart failure in some people. Talk to a dietitian to learn more about heart-healthy seasonings.  Use healthy cooking methods. Healthy cooking methods include roasting, grilling, broiling, baking, poaching, steaming, or stir-frying. Talk to a dietitian to learn more about healthy cooking methods.  Limit fluids if directed by your health care provider. Fluid restriction may reduce symptoms of heart failure in some people.  Weigh yourself every day. Daily weights are important in the early recognition of excess fluid. You should weigh yourself every morning after you urinate and before you eat breakfast. Wear the same amount of clothing each time you weigh yourself. Record your daily weight.  Provide your health care provider with your weight record.  Monitor and record your blood pressure if directed by your health care provider.  Check your pulse if directed by your health care provider.  Lose weight if directed by your health care provider. Weight loss may reduce symptoms of heart failure in some  people.  Stop smoking or chewing tobacco. Nicotine makes your heart work harder by causing your blood vessels to constrict. Do not use nicotine gum or patches before talking to your health care provider.  Keep all follow-up visits as directed by your health care provider. This is important.  Limit alcohol intake to no more than 1 drink per day for nonpregnant women and 2 drinks per day for men. One drink equals 12 ounces of beer, 5 ounces of wine, or 1 ounces of hard liquor. Drinking more than that is harmful to your heart. Tell your health care provider if you drink alcohol several times a week. Talk with your health care provider about whether alcohol is safe for you. If your heart has already been damaged by alcohol or you have severe heart failure, drinking alcohol should be stopped completely.  Stop illicit drug use.  Stay up-to-date with immunizations. It is especially important to prevent respiratory infections through current pneumococcal and influenza immunizations.  Manage other health conditions such as hypertension, diabetes, thyroid disease, or abnormal heart rhythms as directed by your health care provider.  Learn to manage stress.  Plan rest periods when fatigued.  Learn strategies to manage high temperatures. If the weather is extremely hot:  Avoid vigorous physical activity.  Use air conditioning or fans or seek a cooler location.  Avoid caffeine and alcohol.  Wear loose-fitting, lightweight, and light-colored clothing.  Learn strategies to manage cold temperatures. If the weather is extremely cold:  Avoid vigorous physical activity.  Layer clothes.  Wear mittens or gloves, a hat, and a scarf when going outside.  Avoid alcohol.  Obtain ongoing education and support as needed.  Participate in or seek rehabilitation as needed to maintain or improve independence and quality of life. SEEK MEDICAL CARE IF:   Your weight increases by 03 lb/1.4 kg in 1 day or 05  lb/2.3 kg in a week.  You have increasing shortness of breath that is unusual for you.  You are unable to participate in your usual physical activities.  You tire easily.  You cough more than normal, especially with physical activity.  You have any or more swelling in areas such as your hands, feet, ankles, or abdomen.  You are unable to sleep because it is hard to breathe.  You feel like your heart is beating fast (palpitations).  You become dizzy or light-headed upon standing up. SEEK IMMEDIATE MEDICAL CARE IF:   You have difficulty breathing.  There is a change in mental status such as decreased alertness or difficulty with concentration.  You have a pain or discomfort in your chest.  You have an episode of fainting (syncope). MAKE SURE YOU:   Understand these instructions.  Will watch your condition.  Will get help right away if you are not doing well or get worse. Document Released: 12/17/2005 Document Revised: 05/03/2014 Document Reviewed: 01/16/2013 [facility] Patient Information 2015 [place], [place]. This information is not intended to replace advice given to you by your health care provider. Make sure you discuss any questions you have with your health care provider.

## 2015-06-06 ENCOUNTER — Encounter: Payer: Self-pay | Admitting: Internal Medicine

## 2015-06-07 ENCOUNTER — Inpatient Hospital Stay (HOSPITAL_COMMUNITY)
Admission: AD | Admit: 2015-06-07 | Discharge: 2015-08-01 | DRG: 271 | Disposition: E | Payer: BLUE CROSS/BLUE SHIELD | Source: Ambulatory Visit | Attending: Internal Medicine | Admitting: Internal Medicine

## 2015-06-07 ENCOUNTER — Encounter: Payer: Self-pay | Admitting: Internal Medicine

## 2015-06-07 ENCOUNTER — Ambulatory Visit (HOSPITAL_BASED_OUTPATIENT_CLINIC_OR_DEPARTMENT_OTHER)
Admission: RE | Admit: 2015-06-07 | Discharge: 2015-06-07 | Disposition: A | Discharge status: Home / Self Care | Payer: BLUE CROSS/BLUE SHIELD | Source: Ambulatory Visit | Attending: Internal Medicine | Admitting: Internal Medicine

## 2015-06-07 ENCOUNTER — Encounter (HOSPITAL_COMMUNITY): Payer: Self-pay | Admitting: General Practice

## 2015-06-07 ENCOUNTER — Encounter (HOSPITAL_COMMUNITY): Payer: Self-pay

## 2015-06-07 VITALS — BP 102/66 | HR 120 | Wt 174.5 lb

## 2015-06-07 DIAGNOSIS — E1122 Type 2 diabetes mellitus with diabetic chronic kidney disease: Secondary | ICD-10-CM | POA: Diagnosis present

## 2015-06-07 DIAGNOSIS — N183 Chronic kidney disease, stage 3 (moderate): Secondary | ICD-10-CM

## 2015-06-07 DIAGNOSIS — E119 Type 2 diabetes mellitus without complications: Secondary | ICD-10-CM

## 2015-06-07 DIAGNOSIS — E871 Hypo-osmolality and hyponatremia: Secondary | ICD-10-CM | POA: Diagnosis not present

## 2015-06-07 DIAGNOSIS — F419 Anxiety disorder, unspecified: Secondary | ICD-10-CM | POA: Diagnosis present

## 2015-06-07 DIAGNOSIS — I5023 Acute on chronic systolic (congestive) heart failure: Secondary | ICD-10-CM

## 2015-06-07 DIAGNOSIS — Z515 Encounter for palliative care: Secondary | ICD-10-CM | POA: Diagnosis not present

## 2015-06-07 DIAGNOSIS — R0602 Shortness of breath: Secondary | ICD-10-CM | POA: Insufficient documentation

## 2015-06-07 DIAGNOSIS — Z8249 Family history of ischemic heart disease and other diseases of the circulatory system: Secondary | ICD-10-CM

## 2015-06-07 DIAGNOSIS — E669 Obesity, unspecified: Secondary | ICD-10-CM | POA: Diagnosis present

## 2015-06-07 DIAGNOSIS — R630 Anorexia: Secondary | ICD-10-CM | POA: Diagnosis present

## 2015-06-07 DIAGNOSIS — I252 Old myocardial infarction: Secondary | ICD-10-CM

## 2015-06-07 DIAGNOSIS — I509 Heart failure, unspecified: Secondary | ICD-10-CM | POA: Diagnosis not present

## 2015-06-07 DIAGNOSIS — I130 Hypertensive heart and chronic kidney disease with heart failure and stage 1 through stage 4 chronic kidney disease, or unspecified chronic kidney disease: Secondary | ICD-10-CM | POA: Diagnosis present

## 2015-06-07 DIAGNOSIS — I209 Angina pectoris, unspecified: Secondary | ICD-10-CM | POA: Diagnosis not present

## 2015-06-07 DIAGNOSIS — N189 Chronic kidney disease, unspecified: Secondary | ICD-10-CM

## 2015-06-07 DIAGNOSIS — R109 Unspecified abdominal pain: Secondary | ICD-10-CM

## 2015-06-07 DIAGNOSIS — T508X5A Adverse effect of diagnostic agents, initial encounter: Secondary | ICD-10-CM | POA: Diagnosis not present

## 2015-06-07 DIAGNOSIS — N179 Acute kidney failure, unspecified: Secondary | ICD-10-CM | POA: Diagnosis present

## 2015-06-07 DIAGNOSIS — I255 Ischemic cardiomyopathy: Secondary | ICD-10-CM | POA: Diagnosis present

## 2015-06-07 DIAGNOSIS — Z87891 Personal history of nicotine dependence: Secondary | ICD-10-CM

## 2015-06-07 DIAGNOSIS — I471 Supraventricular tachycardia: Secondary | ICD-10-CM | POA: Diagnosis not present

## 2015-06-07 DIAGNOSIS — Z8673 Personal history of transient ischemic attack (TIA), and cerebral infarction without residual deficits: Secondary | ICD-10-CM

## 2015-06-07 DIAGNOSIS — Z4509 Encounter for adjustment and management of other cardiac device: Secondary | ICD-10-CM

## 2015-06-07 DIAGNOSIS — R570 Cardiogenic shock: Secondary | ICD-10-CM | POA: Diagnosis not present

## 2015-06-07 DIAGNOSIS — R0600 Dyspnea, unspecified: Secondary | ICD-10-CM | POA: Diagnosis present

## 2015-06-07 DIAGNOSIS — Z66 Do not resuscitate: Secondary | ICD-10-CM | POA: Diagnosis present

## 2015-06-07 DIAGNOSIS — F319 Bipolar disorder, unspecified: Secondary | ICD-10-CM | POA: Diagnosis present

## 2015-06-07 DIAGNOSIS — D696 Thrombocytopenia, unspecified: Secondary | ICD-10-CM | POA: Diagnosis not present

## 2015-06-07 DIAGNOSIS — Z992 Dependence on renal dialysis: Secondary | ICD-10-CM | POA: Diagnosis not present

## 2015-06-07 DIAGNOSIS — R34 Anuria and oliguria: Secondary | ICD-10-CM | POA: Diagnosis present

## 2015-06-07 DIAGNOSIS — E875 Hyperkalemia: Secondary | ICD-10-CM | POA: Diagnosis not present

## 2015-06-07 DIAGNOSIS — Z95810 Presence of automatic (implantable) cardiac defibrillator: Secondary | ICD-10-CM

## 2015-06-07 DIAGNOSIS — K219 Gastro-esophageal reflux disease without esophagitis: Secondary | ICD-10-CM | POA: Diagnosis present

## 2015-06-07 DIAGNOSIS — E785 Hyperlipidemia, unspecified: Secondary | ICD-10-CM | POA: Diagnosis present

## 2015-06-07 DIAGNOSIS — M109 Gout, unspecified: Secondary | ICD-10-CM | POA: Diagnosis present

## 2015-06-07 DIAGNOSIS — Z955 Presence of coronary angioplasty implant and graft: Secondary | ICD-10-CM

## 2015-06-07 DIAGNOSIS — I5022 Chronic systolic (congestive) heart failure: Secondary | ICD-10-CM

## 2015-06-07 DIAGNOSIS — N390 Urinary tract infection, site not specified: Secondary | ICD-10-CM | POA: Diagnosis not present

## 2015-06-07 DIAGNOSIS — I2510 Atherosclerotic heart disease of native coronary artery without angina pectoris: Secondary | ICD-10-CM | POA: Diagnosis present

## 2015-06-07 DIAGNOSIS — R000 Tachycardia, unspecified: Secondary | ICD-10-CM

## 2015-06-07 DIAGNOSIS — E440 Moderate protein-calorie malnutrition: Secondary | ICD-10-CM | POA: Insufficient documentation

## 2015-06-07 DIAGNOSIS — I129 Hypertensive chronic kidney disease with stage 1 through stage 4 chronic kidney disease, or unspecified chronic kidney disease: Secondary | ICD-10-CM | POA: Diagnosis present

## 2015-06-07 DIAGNOSIS — D62 Acute posthemorrhagic anemia: Secondary | ICD-10-CM | POA: Diagnosis not present

## 2015-06-07 DIAGNOSIS — Z452 Encounter for adjustment and management of vascular access device: Secondary | ICD-10-CM

## 2015-06-07 DIAGNOSIS — M1990 Unspecified osteoarthritis, unspecified site: Secondary | ICD-10-CM | POA: Diagnosis present

## 2015-06-07 DIAGNOSIS — G43909 Migraine, unspecified, not intractable, without status migrainosus: Secondary | ICD-10-CM | POA: Diagnosis present

## 2015-06-07 DIAGNOSIS — I519 Heart disease, unspecified: Secondary | ICD-10-CM

## 2015-06-07 DIAGNOSIS — N184 Chronic kidney disease, stage 4 (severe): Secondary | ICD-10-CM | POA: Diagnosis present

## 2015-06-07 DIAGNOSIS — N141 Nephropathy induced by other drugs, medicaments and biological substances: Secondary | ICD-10-CM | POA: Diagnosis not present

## 2015-06-07 DIAGNOSIS — N139 Obstructive and reflux uropathy, unspecified: Secondary | ICD-10-CM

## 2015-06-07 HISTORY — DX: Other chronic pain: G89.29

## 2015-06-07 HISTORY — DX: Presence of automatic (implantable) cardiac defibrillator: Z95.810

## 2015-06-07 HISTORY — DX: Dorsalgia, unspecified: M54.9

## 2015-06-07 LAB — GLUCOSE, CAPILLARY: Glucose-Capillary: 115 mg/dL — ABNORMAL HIGH (ref 65–99)

## 2015-06-07 LAB — COMPREHENSIVE METABOLIC PANEL
ALT: 9 U/L — ABNORMAL LOW (ref 14–54)
AST: 18 U/L (ref 15–41)
Albumin: 3.3 g/dL — ABNORMAL LOW (ref 3.5–5.0)
Alkaline Phosphatase: 46 U/L (ref 38–126)
Anion gap: 13 (ref 5–15)
BILIRUBIN TOTAL: 1.3 mg/dL — AB (ref 0.3–1.2)
BUN: 71 mg/dL — ABNORMAL HIGH (ref 6–20)
CALCIUM: 9.1 mg/dL (ref 8.9–10.3)
CHLORIDE: 102 mmol/L (ref 101–111)
CO2: 20 mmol/L — AB (ref 22–32)
Creatinine, Ser: 2.66 mg/dL — ABNORMAL HIGH (ref 0.44–1.00)
GFR calc Af Amer: 21 mL/min — ABNORMAL LOW (ref >60)
GFR calc non Af Amer: 18 mL/min — ABNORMAL LOW (ref >60)
GLUCOSE: 119 mg/dL — AB (ref 65–99)
Potassium: 3.8 mmol/L (ref 3.5–5.1)
SODIUM: 135 mmol/L (ref 135–145)
Total Protein: 7.4 g/dL (ref 6.5–8.1)

## 2015-06-07 LAB — CBC WITH DIFFERENTIAL/PLATELET
Basophils Absolute: 0 10*3/uL (ref 0.0–0.1)
Basophils Relative: 0 % (ref 0–1)
Eosinophils Absolute: 0.1 10*3/uL (ref 0.0–0.7)
Eosinophils Relative: 2 % (ref 0–5)
HCT: 35 % — ABNORMAL LOW (ref 36.0–46.0)
HEMOGLOBIN: 12 g/dL (ref 12.0–15.0)
LYMPHS PCT: 23 % (ref 12–46)
Lymphs Abs: 1.1 10*3/uL (ref 0.7–4.0)
MCH: 22.3 pg — ABNORMAL LOW (ref 26.0–34.0)
MCHC: 34.3 g/dL (ref 30.0–36.0)
MCV: 64.9 fL — ABNORMAL LOW (ref 78.0–100.0)
MONO ABS: 0.5 10*3/uL (ref 0.1–1.0)
Monocytes Relative: 10 % (ref 3–12)
NEUTROS ABS: 2.9 10*3/uL (ref 1.7–7.7)
Neutrophils Relative %: 65 % (ref 43–77)
Platelets: 143 10*3/uL — ABNORMAL LOW (ref 150–400)
RBC: 5.39 MIL/uL — ABNORMAL HIGH (ref 3.87–5.11)
RDW: 20.9 % — ABNORMAL HIGH (ref 11.5–15.5)
WBC: 4.6 10*3/uL (ref 4.0–10.5)

## 2015-06-07 LAB — TROPONIN I: TROPONIN I: 0.11 ng/mL — AB (ref <0.031)

## 2015-06-07 LAB — CARBOXYHEMOGLOBIN
Carboxyhemoglobin: 1.5 % (ref 0.5–1.5)
Methemoglobin: 1.4 % (ref 0.0–1.5)
O2 SAT: 78 %
TOTAL HEMOGLOBIN: 12 g/dL (ref 12.0–16.0)

## 2015-06-07 LAB — BRAIN NATRIURETIC PEPTIDE: B NATRIURETIC PEPTIDE 5: 1217.7 pg/mL — AB (ref 0.0–100.0)

## 2015-06-07 LAB — TSH: TSH: 2.417 u[IU]/mL (ref 0.350–4.500)

## 2015-06-07 LAB — MAGNESIUM: Magnesium: 2.1 mg/dL (ref 1.7–2.4)

## 2015-06-07 MED ORDER — ENSURE ENLIVE PO LIQD: 237.0000 mL | Freq: Two times a day (BID) | ORAL | Status: DC

## 2015-06-07 MED ORDER — HYDRALAZINE HCL 25 MG PO TABS: 25.0000 mg | ORAL_TABLET | Freq: Three times a day (TID) | ORAL | Status: DC

## 2015-06-07 MED ORDER — SODIUM CHLORIDE 0.9 % IJ SOLN: 3.0000 mL | INTRAMUSCULAR | Status: DC | PRN

## 2015-06-07 MED ORDER — ENOXAPARIN SODIUM 30 MG/0.3ML ~~LOC~~ SOLN
30.0000 mg | SUBCUTANEOUS | Status: DC
  Administered 2015-06-07 – 2015-06-08 (×2): 30 mg via SUBCUTANEOUS
  Filled 2015-06-07 (×4): qty 0.3

## 2015-06-07 MED ORDER — MAGNESIUM OXIDE 400 (241.3 MG) MG PO TABS
400.0000 mg | ORAL_TABLET | Freq: Three times a day (TID) | ORAL | Status: DC
  Administered 2015-06-07 – 2015-06-11 (×10): 400 mg via ORAL
  Filled 2015-06-07 (×13): qty 1

## 2015-06-07 MED ORDER — MILRINONE IN DEXTROSE 20 MG/100ML IV SOLN
0.3750 ug/kg/min | INTRAVENOUS | Status: DC
  Administered 2015-06-07 – 2015-06-09 (×3): 0.375 ug/kg/min via INTRAVENOUS
  Filled 2015-06-07 (×4): qty 100

## 2015-06-07 MED ORDER — DULOXETINE HCL 60 MG PO CPEP
60.0000 mg | ORAL_CAPSULE | Freq: Every day | ORAL | Status: DC
  Administered 2015-06-08 – 2015-06-30 (×23): 60 mg via ORAL
  Filled 2015-06-07 (×25): qty 1

## 2015-06-07 MED ORDER — ROPINIROLE HCL 1 MG PO TABS
2.0000 mg | ORAL_TABLET | Freq: Every day | ORAL | Status: DC
  Administered 2015-06-07 – 2015-07-01 (×24): 2 mg via ORAL
  Filled 2015-06-07 (×27): qty 2

## 2015-06-07 MED ORDER — MILRINONE IN DEXTROSE 20 MG/100ML IV SOLN: 0.3750 ug/kg/min | INTRAVENOUS | Status: DC

## 2015-06-07 MED ORDER — SODIUM CHLORIDE 0.9 % IJ SOLN
10.0000 mL | INTRAMUSCULAR | Status: DC | PRN
  Administered 2015-06-07 – 2015-06-08 (×2): 10 mL
  Filled 2015-06-07: qty 40

## 2015-06-07 MED ORDER — FUROSEMIDE 10 MG/ML IJ SOLN
80.0000 mg | Freq: Two times a day (BID) | INTRAMUSCULAR | Status: DC
  Administered 2015-06-07 – 2015-06-08 (×2): 80 mg via INTRAVENOUS
  Filled 2015-06-07 (×4): qty 8

## 2015-06-07 MED ORDER — ONDANSETRON 8 MG PO TBDP
8.0000 mg | ORAL_TABLET | Freq: Three times a day (TID) | ORAL | Status: DC | PRN
  Filled 2015-06-07: qty 1

## 2015-06-07 MED ORDER — HYDRALAZINE HCL 25 MG PO TABS
25.0000 mg | ORAL_TABLET | Freq: Three times a day (TID) | ORAL | Status: DC
  Administered 2015-06-07 – 2015-06-10 (×7): 25 mg via ORAL
  Filled 2015-06-07 (×12): qty 1

## 2015-06-07 MED ORDER — ACETAMINOPHEN 325 MG PO TABS
650.0000 mg | ORAL_TABLET | ORAL | Status: DC | PRN
  Administered 2015-06-09 – 2015-06-14 (×2): 650 mg via ORAL
  Filled 2015-06-07 (×2): qty 2

## 2015-06-07 MED ORDER — POTASSIUM CHLORIDE CRYS ER 20 MEQ PO TBCR
20.0000 meq | EXTENDED_RELEASE_TABLET | Freq: Two times a day (BID) | ORAL | Status: DC
  Administered 2015-06-07 – 2015-06-08 (×3): 20 meq via ORAL
  Filled 2015-06-07 (×5): qty 1

## 2015-06-07 MED ORDER — SODIUM CHLORIDE 0.9 % IV SOLN
250.0000 mL | INTRAVENOUS | Status: DC | PRN
  Administered 2015-06-11 – 2015-06-12 (×3): 250 mL via INTRAVENOUS
  Administered 2015-06-14: 10 mL via INTRAVENOUS
  Administered 2015-06-23: 250 mL via INTRAVENOUS

## 2015-06-07 MED ORDER — ZOLPIDEM TARTRATE 5 MG PO TABS
5.0000 mg | ORAL_TABLET | Freq: Every day | ORAL | Status: DC
  Administered 2015-06-07 – 2015-07-01 (×24): 5 mg via ORAL
  Filled 2015-06-07 (×25): qty 1

## 2015-06-07 MED ORDER — SODIUM CHLORIDE 0.9 % IJ SOLN
3.0000 mL | Freq: Two times a day (BID) | INTRAMUSCULAR | Status: DC
  Administered 2015-06-08 – 2015-06-27 (×12): 3 mL via INTRAVENOUS

## 2015-06-07 MED ORDER — PANTOPRAZOLE SODIUM 40 MG PO TBEC
40.0000 mg | DELAYED_RELEASE_TABLET | Freq: Every day | ORAL | Status: DC
  Administered 2015-06-08 – 2015-06-30 (×23): 40 mg via ORAL
  Filled 2015-06-07 (×23): qty 1

## 2015-06-07 MED ORDER — POLYETHYLENE GLYCOL 3350 17 G PO PACK
17.0000 g | PACK | Freq: Every day | ORAL | Status: DC | PRN
  Filled 2015-06-07: qty 1

## 2015-06-07 MED ORDER — CALCITRIOL 0.25 MCG PO CAPS
0.2500 ug | ORAL_CAPSULE | Freq: Every day | ORAL | Status: DC
  Administered 2015-06-08 – 2015-06-30 (×23): 0.25 ug via ORAL
  Filled 2015-06-07 (×24): qty 1

## 2015-06-07 MED ORDER — ONDANSETRON HCL 4 MG/2ML IJ SOLN
4.0000 mg | Freq: Four times a day (QID) | INTRAMUSCULAR | Status: DC | PRN
  Administered 2015-06-08 – 2015-07-02 (×4): 4 mg via INTRAVENOUS
  Filled 2015-06-07 (×4): qty 2

## 2015-06-07 MED ORDER — ACETAMINOPHEN 500 MG PO TABS: 500.0000 mg | ORAL_TABLET | Freq: Four times a day (QID) | ORAL | Status: DC | PRN

## 2015-06-07 MED ORDER — FERROUS SULFATE 325 (65 FE) MG PO TABS
325.0000 mg | ORAL_TABLET | Freq: Three times a day (TID) | ORAL | Status: DC
  Administered 2015-06-08 – 2015-06-11 (×10): 325 mg via ORAL
  Filled 2015-06-07 (×13): qty 1

## 2015-06-07 MED ORDER — TRAMADOL-ACETAMINOPHEN 37.5-325 MG PO TABS
1.0000 | ORAL_TABLET | Freq: Two times a day (BID) | ORAL | Status: DC | PRN
  Administered 2015-06-20: 1 via ORAL
  Filled 2015-06-07: qty 1

## 2015-06-07 MED ORDER — NITROGLYCERIN 0.4 MG SL SUBL: 0.4000 mg | SUBLINGUAL_TABLET | SUBLINGUAL | Status: DC | PRN

## 2015-06-07 MED ORDER — GABAPENTIN 100 MG PO CAPS
200.0000 mg | ORAL_CAPSULE | Freq: Every day | ORAL | Status: DC
  Administered 2015-06-07 – 2015-07-01 (×24): 200 mg via ORAL
  Filled 2015-06-07 (×27): qty 2

## 2015-06-07 NOTE — H&P (Addendum)
Advanced Heart Failure Team History and Physical Note    Primary Cardiologist:  Dr Gala Romney   Reason for Admission: Acute Decompensated Heart Failure    HPI:   Andrea [name] is a 64 y.o. female with a history of CAD DES to prox RCA and stent to dist RCA in 2012, chronic systolic heart failure, ICM s/p ICD placement, liver failure thougth to be from cardiomyopathy -seen at [facility], CVA intolerant aspirin , CKD, GERD, and DM. She is on chronic home milrinone 0.375 mcg since 02/2014.   Today she was evaluated in the HF clinic. She reports increase dyspnea and fatigue. Has also had diarrhea over the last few days. Dr Randa Evens evaluated thought she had virus. On immodium and started on antibiotics. Complaining of fatigue, N/V. SOB with exertion. + Orthopnea. Increased leg edema. Appetite poor. Having diarrhea.Weight at home has not been going up. Followed by [facility] for home milrinone and weekly BMET.     Review of Systems: [y] = yes, [ ]  = no   General: Weight gain [ ] ; Weight loss [Y ]; Anorexia [ ] ; Fatigue [Y ]; Fever [ ] ; Chills [ ] ; Weakness [Y]  Cardiac: Chest pain/pressure [ ] ; Resting SOB [Y ]; Exertional SOB [Y ]; Orthopnea [Y]; Pedal Edema [Y ]; Palpitations [ ] ; Syncope [ ] ; Presyncope [ ] ; Paroxysmal nocturnal dyspnea[ ]   Pulmonary: Cough [ ] ; Wheezing[ ] ; Hemoptysis[ ] ; Sputum [ ] ; Snoring [ ]   GI: Vomiting[ ] ; Dysphagia[ ] ; Melena[ ] ; Hematochezia [ ] ; Heartburn[ ] ; Abdominal pain [ ] ; Constipation [ ] ; Diarrhea [ ] ; BRBPR [ ]   GU: Hematuria[ ] ; Dysuria [ ] ; Nocturia[ ]   Vascular: Pain in legs with walking [ ] ; Pain in feet with lying flat [ ] ; Non-healing sores [ ] ; Stroke [ ] ; TIA [ ] ; Slurred speech [ ] ;  Neuro: Headaches[ ] ; Vertigo[ ] ; Seizures[ ] ; Paresthesias[ ] ;Blurred vision [ ] ; Diplopia [ ] ; Vision changes [ ]   Ortho/Skin: Arthritis [ ] ; Joint pain [Y ]; Muscle pain [ ] ; Joint swelling [ ] ; Back Pain [ ] ; Rash [ ]   Psych: Depression[ ] ; Anxiety[ ]   Heme: Bleeding  problems [ ] ; Clotting disorders [ ] ; Anemia [ ]   Endocrine: Diabetes [Y ]; Thyroid dysfunction[ ]   Home Medications Prior to Admission medications   Medication Sig Start Date End Date Taking? Authorizing Provider  acetaminophen (TYLENOL) 500 MG tablet Take 500 mg by mouth every 6 (six) hours as needed for mild pain.    Historical Provider, MD  calcitRIOL (ROCALTROL) 0.25 MCG capsule Take 1 capsule by mouth daily. 01/07/15   Historical Provider, MD  diclofenac sodium (VOLTAREN) 1 % GEL Apply 2 g topically 4 (four) times daily as needed (pain).     Historical Provider, MD  DULoxetine (CYMBALTA) 60 MG capsule Take 60 mg by mouth daily.    Historical Provider, MD  ferrous sulfate 325 (65 FE) MG tablet Take 1 tablet (325 mg total) by mouth 3 (three) times daily with meals. 03/26/15   Renae Fickle, MD  furosemide (LASIX) 40 MG tablet Take 1 tablet (40 mg total) by mouth daily. 04/13/15   Amy D Filbert Schilder, NP  gabapentin (NEURONTIN) 100 MG capsule Take 200 mg by mouth at bedtime.    Historical Provider, MD  hydrALAZINE (APRESOLINE) 25 MG tablet Take 1 tablet (25 mg total) by mouth 3 (three) times daily. 04/13/15   Amy D Clegg, NP  magnesium oxide (MAG-OX) 400 (241.3 MG) MG tablet Take 1 tablet (400 mg total)  by mouth 3 (three) times daily. 08/09/14   Dolores Patty, MD  milrinone [facility]) 20 MG/100ML SOLN infusion Inject 30.225 mcg/min into the vein continuous. 04/12/14   Aundria Rud, NP  nitroGLYCERIN (NITROSTAT) 0.4 MG SL tablet Place 1 tablet (0.4 mg total) under the tongue every 5 (five) minutes as needed for chest pain (MAX 3 TABLETS IN 15 MINUTES). 01/17/15   Marinus Maw, MD  ondansetron (ZOFRAN ODT) 8 MG disintegrating tablet Take 1 tablet (8 mg total) by mouth every 8 (eight) hours as needed for nausea or vomiting. 03/16/15   Marisa Severin, MD  pantoprazole (PROTONIX) 40 MG tablet Take 1 tablet by mouth daily. 01/07/15   Historical Provider, MD  polyethylene glycol (MIRALAX / GLYCOLAX) packet Take  17 g by mouth daily as needed for mild constipation.    Historical Provider, MD  ranitidine (ZANTAC) 300 MG tablet Take 1 tablet by mouth daily. 12/20/14   Historical Provider, MD  rOPINIRole (REQUIP) 2 MG tablet Take 0.5 tablets by mouth at bedtime. 12/20/14   Historical Provider, MD  traMADol-acetaminophen (ULTRACET) 37.5-325 MG per tablet Take 1 tablet by mouth every 6 (six) hours as needed. 05/16/15   Tatyana Kirichenko, PA-C  zolpidem (AMBIEN) 5 MG tablet Take 1 tablet by mouth at bedtime. 12/20/14   Historical Provider, MD    Past Medical History: Past Medical History  Diagnosis Date  . Chronic systolic heart failure   . Hypertension   . Stroke 02/2010    left brain CVA? recurrent TIA's treated with TPA  . Dyslipidemia   . Premature ventricular contractions (PVCs) (VPCs)   . GERD (gastroesophageal reflux disease)   . Coronary artery disease     a. DES-dRCA 11/2010. b. DES-pRCA 04/2011 (in an area free of disease on prior cath). c. s/p DES-mRCA 03/2013 for NSTEMI.  . Ischemic cardiomyopathy     a. Hx of medtronic ICD. b. EF 15-20% by cath 03/2013.  . Pulmonary hypertension   . Gout   . Obesity   . Thrombocytopenia   . Depression   . Hypotension     a. Admission 09/2012 for hypotn & ARF. b. Meds held 03/2013 due to hypotension.  . Acute renal insufficiency     a. 09/2012. b. Also noted post-cath 03/2013.  . NSTEMI (non-ST elevated myocardial infarction) 03/2013  . Anginal pain   . Asthma   . Microcytic anemia     a. Noted 03/2013 on labs, iron studies WNL.  Marland Kitchen Anxiety   . CHF (congestive heart failure)   . CKD (chronic kidney disease) stage 3, GFR 30-59 ml/min   . HTN (hypertension)   . Primary pulmonary HTN   . Automatic implantable cardiac defibrillator - Medtronic 08/24/2013  . Heart murmur   . Type II diabetes mellitus   . Migraine headache     "no pain; aura/visual problems only; at least 2-3X/month" (01/18/2014)  . Arthritis     "knees" (01/18/2014)  . DJD (degenerative  joint disease)   . Bipolar disorder     Past Surgical History: Past Surgical History  Procedure Laterality Date  . Mass excision Left     hip  . Cervical polypectomy  1970's  . US echocardiography  2011  . Tonsillectomy  1960's?  . Right oophorectomy  1980's  . Dilation and curettage of uterus  1970's  . Ostectomy metatarsal Left 1993    "5th toe; from rickets" (05/07/2013)  . Implantable cardioverter defibrillator implant  ~ 2008  . Coronary angioplasty  with stent placement  2000's    "2 stents" (05/07/2013)  . Coronary angioplasty with stent placement  03/2013    PCI to RCA/notes 05/05/2013; "makes total of 3" (05/07/2013)  . Left heart catheterization with coronary angiogram N/A 04/27/2013    Procedure: LEFT HEART CATHETERIZATION WITH CORONARY ANGIOGRAM;  Surgeon: Kathleene Hazel, MD;  Location: [facility] CATH LAB;  Service: Cardiovascular;  Laterality: N/A;  . Percutaneous coronary stent intervention (pci-s)  04/27/2013    Procedure: PERCUTANEOUS CORONARY STENT INTERVENTION (PCI-S);  Surgeon: Kathleene Hazel, MD;  Location: [facility] CATH LAB;  Service: Cardiovascular;;  Mid RCA   . Left and right heart catheterization with coronary angiogram N/A 04/12/2014    Procedure: LEFT AND RIGHT HEART CATHETERIZATION WITH CORONARY ANGIOGRAM;  Surgeon: Dolores Patty, MD;  Location: [facility] CATH LAB;  Service: Cardiovascular;  Laterality: N/A;    Family History: Family History  Problem Relation Age of Onset  . Heart failure Mother   . Heart attack Father   . Heart disease Brother     Social History: History   Social History  . Marital Status: Single    Spouse Name: N/A  . Number of Children: 1  . Years of Education: N/A   Occupational History  .     Social History Main Topics  . Smoking status: Former Smoker -- 0.05 packs/day for 5 years    Types: Cigarettes    Quit date: 04/23/1982  . Smokeless tobacco: Never Used  . Alcohol Use: No  . Drug Use: No  . Sexual Activity: Yes    Other Topics Concern  . Not on file   Social History Narrative   ** Merged History Encounter **        Allergies:  Allergies  Allergen Reactions  . Aspirin Shortness Of Breath and Other (See Comments)    Asthma symptoms  . Brilinta [Ticagrelor] Shortness Of Breath and Other (See Comments)    Gout   . Percocet [Oxycodone-Acetaminophen] Nausea And Vomiting    Patient can tolerate acetaminophen solely  . Darvon Nausea And Vomiting  . Diovan [Valsartan] Other (See Comments) and Nausea Only    unknown  . Imitrex [Sumatriptan Base] Other (See Comments)    Unknown reaction  . Licorice Flavor [Artificial Licorice Flavor]     Black licorice induces asthma   . Nsaids Nausea And Vomiting and Other (See Comments)    GI upset   . Other Other (See Comments)    Black Licorice  - causes asthma attach    Objective:    Vital Signs:         BP: 102/66  Pulse: 120  Weight: 174 lb 8 oz (79.153 kg)  SpO2: 94%   PHYSICAL EXAM: General: SOB walking in the clinic  HEENT: normal Neck: supple. JVP to jaw . Carotids 2+ bilaterally; no bruits. No lymphadenopathy or thryomegaly appreciated. Cor: PMI normal. Tachy Regular No rubs, or murmurs. +S3 Lungs: clear Abdomen: soft, nontender, nondistended. No hepatosplenomegaly. No bruits or masses. Good bowel sounds. Extremities: no cyanosis, clubbing, rash, R and LLE 3+ edema, R upper chest. Neuro: alert & orientedx3, cranial nerves grossly intact. Moves all 4 extremities w/o difficulty. Affect pleasant       Labs: Basic Metabolic Panel:  Recent Labs Lab 05/31/15 1934 05/31/15 1943  NA 136 137  K 4.4 4.4  CL 101 103  CO2 22  --   GLUCOSE 90 88  BUN 61* 57*  CREATININE 2.86* 2.90*  CALCIUM 9.6  --  Liver Function Tests: No results for input(s): AST, ALT, ALKPHOS, BILITOT, PROT, ALBUMIN in the last 168 hours. No results for input(s): LIPASE, AMYLASE in the last 168 hours. No results for input(s): AMMONIA in  the last 168 hours.  CBC:  Recent Labs Lab 05/31/15 1943  HGB 14.3  HCT 42.0    Cardiac Enzymes: No results for input(s): CKTOTAL, CKMB, CKMBINDEX, TROPONINI in the last 168 hours.  BNP: BNP (last 3 results)  Recent Labs  03/15/15 2150 03/22/15 1601  BNP 701.4* 930.9*    ProBNP (last 3 results)  Recent Labs  11/08/14 1800  PROBNP 2681.0*     CBG: No results for input(s): GLUCAP in the last 168 hours.  Coagulation Studies: No results for input(s): LABPROT, INR in the last 72 hours.  Other results: EKG:   Imaging:  No results found.      Assessment/Plan    1. Acute Chronic Systolic HF, ICM s/p ICD; EF ~ 30-35% on milrinone 0.375 mcg (07/2014). Not interested in VAD.  - Chronic NYHA III-IV  Check CO-OX now. Tachycardic today. May need to increase milrinone.  Stop lasix and diuresed with IV lasix  Cut back hydralazine to 12.5 mg . BP soft. Unable to tolerate IMDUR due to severe HAs in setting of known migraines. 2. CKD stage III - Baseline creatinine running 1.4-1.6. Creatinine up. Watch carefully.  3. CAD S/P PCI with DES to prox RCA and stent to distal RCA in 2012. No s/s of ischemia.  4. PSVT - quiescent. continue amio 200 daily. Check TFTs, LFTs next lab draw from [facility].  5. Diarrhea. - Check stool for C. diff    Admit to telemetry and diurese with IV lasix. Check labs , CXR CLEGG,AMY NP-C  2:23 PM   Length of Stay:   CLEGG,AMY NP-C  06/06/2015, 3:10 PM  Advanced Heart Failure Team Pager [phone] (M-F; 7a - 4p)  Please contact [facility] for night-coverage after hours (4p -7a ) and weekends on [email or web address]  Patient seen and examined with Tonye Becket, NP. We discussed all aspects of the encounter. I agree with the assessment and plan as stated above.   Appears volume overloaded. Will admit for IV diuresis. Co-ox better than I expected but will continue to follow. No need for increasing inotropes presently. If tachycardia persists  may need to reconsider ivabradine. Watch renal function closely.   Henretter Piekarski,MD 6:39 PM

## 2015-06-07 NOTE — Progress Notes (Signed)
Patient ID: Andrea [name], female   DOB: 1951/04/06, 64 y.o.   MRN: [id number]    HPI: Andrea [name] is a 64 y.o. female with a history of CAD DES to prox RCA and stent to dist RCA in 2012, chronic systolic heart failure, ICM s/p ICD placement, liver failure thougth to be from cardiomyopathy -seen at [facility], CVA intolerant aspirin , CKD, GERD, and DM.  Admitted 03/08/14 with low output HF. Admitting creatinine was 1.3 but increased to 4.49.  She was placed on Milrinone and diuresed with IV lasix. Milrinone was weaned off but restarted due to mixed venous saturation 39%. At that point she was discharged on Milrinone at 0.125 mcg via PICC. Discharge creatinine was 2.0. Discharge weight was 176 pounds.  Milrinone subsequently titrated to 0.375 mcg/kg/min  Follow up for Heart Failure: Remains on Milrinone 0.375 mcg.  Increased dyspnea over the last few days. Problems with diarrhea . Dr Randa Evens evaluated thought she had virus. On immodium and started on antibiotics.  Complaining of fatigue, N/V. SOB with exertion. + Orthopnea. Increased leg edema. Appetite poor. Having diarrhea.   Followed by [facility] for home milrinone and weekly BMET.   R/LHC 04/12/14: RA = 10  RV = 60/4/8  PA = 60/30 (42)  PCWP = 25  Fick CO/CI = 3.6/2.1  Thermo CO/CI = 2.9/1.6  PVR = 5.8  SVR = 1,911  Ao sat = 91%  PA sats = 46%, 49% 1. Nonobstructive CAD with patent stent RCA  ECHO 03/11/14 EF 15% RV ok  ECHO 08/09/14 Bedside EF 30-35%  Labs 03/29/14; Cr 1.3 k 4.1 co-ox 76%          04/20/14: K+ 5.3, creatinine 1.15          04/26/14: K 5.1 Cr 1.01          06/01/14:   K 4.0, creatinine 1.28          07/20/14: K 3.5 Cr 1.16          08/25/14 J 4,5 Creatinine 1.4          09/07/14 K 4.3 Creatinine 1.76 Magnesium 2.2  Coreg stopped and lasix was help for 2 days.         11/08/14: K 4.5, creatinine 1.02, Magnesium 1.9        11/15/14: K+ 4.6, creatinine 1.28, Mag 1.8, AST 30, ALT 18, T4 8.1, TSH 1.8, T3 total 71.9     01/31/2015 K 5.4 Creatinine 1.22         03/26/2015: K 4.1 Creatinine 2.64          04/11/2015: K 5.0 Creatinine 1.65          05/31/2015: K 4.4 Creatinine 2.86          06/06/2015 K 4.4 Creatinine 2.61     SH: Lives with partner, [name] and 81 year old son. She does not drive. Quit smoking 25 years ago. She does not drink alcohol  FH:   Mom Heart Failure          Father MI          Brother Heart Disease     ROS: All systems negative except as listed in HPI, PMH and Problem List.  Past Medical History  Diagnosis Date  . Chronic systolic heart failure   . Hypertension   . Stroke 02/2010    left brain CVA? recurrent TIA's treated with TPA  . Dyslipidemia   . Premature ventricular contractions (PVCs) (VPCs)   .  GERD (gastroesophageal reflux disease)   . Coronary artery disease     a. DES-dRCA 11/2010. b. DES-pRCA 04/2011 (in an area free of disease on prior cath). c. s/p DES-mRCA 03/2013 for NSTEMI.  . Ischemic cardiomyopathy     a. Hx of medtronic ICD. b. EF 15-20% by cath 03/2013.  . Pulmonary hypertension   . Gout   . Obesity   . Thrombocytopenia   . Depression   . Hypotension     a. Admission 09/2012 for hypotn & ARF. b. Meds held 03/2013 due to hypotension.  . Acute renal insufficiency     a. 09/2012. b. Also noted post-cath 03/2013.  . NSTEMI (non-ST elevated myocardial infarction) 03/2013  . Anginal pain   . Asthma   . Microcytic anemia     a. Noted 03/2013 on labs, iron studies WNL.  Marland Kitchen Anxiety   . CHF (congestive heart failure)   . CKD (chronic kidney disease) stage 3, GFR 30-59 ml/min   . HTN (hypertension)   . Primary pulmonary HTN   . Automatic implantable cardiac defibrillator - Medtronic 08/24/2013  . Heart murmur   . Type II diabetes mellitus   . Migraine headache     "no pain; aura/visual problems only; at least 2-3X/month" (01/18/2014)  . Arthritis     "knees" (01/18/2014)  . DJD (degenerative joint disease)   . Bipolar disorder     Current Outpatient  Prescriptions  Medication Sig Dispense Refill  . acetaminophen (TYLENOL) 500 MG tablet Take 500 mg by mouth every 6 (six) hours as needed for mild pain.    . calcitRIOL (ROCALTROL) 0.25 MCG capsule Take 1 capsule by mouth daily.    . diclofenac sodium (VOLTAREN) 1 % GEL Apply 2 g topically 4 (four) times daily as needed (pain).     . DULoxetine (CYMBALTA) 60 MG capsule Take 60 mg by mouth daily.    . ferrous sulfate 325 (65 FE) MG tablet Take 1 tablet (325 mg total) by mouth 3 (three) times daily with meals. 90 tablet 0  . furosemide (LASIX) 40 MG tablet Take 1 tablet (40 mg total) by mouth daily. 30 tablet 6  . gabapentin (NEURONTIN) 100 MG capsule Take 200 mg by mouth at bedtime.    . hydrALAZINE (APRESOLINE) 25 MG tablet Take 1 tablet (25 mg total) by mouth 3 (three) times daily. 90 tablet 0  . magnesium oxide (MAG-OX) 400 (241.3 MG) MG tablet Take 1 tablet (400 mg total) by mouth 3 (three) times daily. 90 tablet 6  . milrinone (PRIMACOR) 20 MG/100ML SOLN infusion Inject 30.225 mcg/min into the vein continuous. 100 mL   . nitroGLYCERIN (NITROSTAT) 0.4 MG SL tablet Place 1 tablet (0.4 mg total) under the tongue every 5 (five) minutes as needed for chest pain (MAX 3 TABLETS IN 15 MINUTES). 25 tablet 3  . ondansetron (ZOFRAN ODT) 8 MG disintegrating tablet Take 1 tablet (8 mg total) by mouth every 8 (eight) hours as needed for nausea or vomiting. 20 tablet 0  . pantoprazole (PROTONIX) 40 MG tablet Take 1 tablet by mouth daily.    . polyethylene glycol (MIRALAX / GLYCOLAX) packet Take 17 g by mouth daily as needed for mild constipation.    . ranitidine (ZANTAC) 300 MG tablet Take 1 tablet by mouth daily.    Marland Kitchen rOPINIRole (REQUIP) 2 MG tablet Take 0.5 tablets by mouth at bedtime.    . traMADol-acetaminophen (ULTRACET) 37.5-325 MG per tablet Take 1 tablet by mouth every 6 (six) hours  as needed. 20 tablet 0  . zolpidem (AMBIEN) 5 MG tablet Take 1 tablet by mouth at bedtime.     No current  facility-administered medications for this encounter.    Filed Vitals:   06/06/2015 1355  BP: 102/66  Pulse: 120  Weight: 174 lb 8 oz (79.153 kg)  SpO2: 94%   PHYSICAL EXAM: General:  SOB walking in the clinic  HEENT: normal Neck: supple. JVP to jaw . Carotids 2+ bilaterally; no bruits. No lymphadenopathy or thryomegaly appreciated. Cor: PMI normal.  Tachy Regular No rubs, or murmurs. +S3 Lungs: clear Abdomen: soft, nontender, nondistended. No hepatosplenomegaly. No bruits or masses. Good bowel sounds. Extremities: no cyanosis, clubbing, rash, R and LLE 3+  edema,  R upper chest. Neuro: alert & orientedx3, cranial nerves grossly intact. Moves all 4 extremities w/o difficulty. Affect pleasant   ASSESSMENT & PLAN:  1. Acute Chronic Systolic HF, ICM s/p ICD; EF ~ 30-35% on milrinone 0.375 mcg (07/2014). Not interested in VAD.  - Chronic NYHA III-IV  Check CO-OX now. Tachycardic today. May need to increase milrinone.  Stop lasix and diuresed with IV lasix  Cut backh dydralazine to 12.5 mg . BP soft.  Unable to tolerate IMDUR due to severe HAs in setting of known migraines. 2. CKD stage III - Baseline creatinine running 1.4-1.6. Creatinine up. Watch carefully.  3. CAD S/P PCI with DES to prox RCA and stent to distal RCA in 2012. No s/s of ischemia.  4. PSVT - quiescent. continue amio 200 daily. Check TFTs, LFTs next lab draw from [facility].   5. Diarrhea. - Check stool.     Admit to telemetry and diurese with IV lasix. Check labs , CXR Endre Coutts NP-C  2:23 PM

## 2015-06-08 ENCOUNTER — Inpatient Hospital Stay (HOSPITAL_COMMUNITY): Payer: BLUE CROSS/BLUE SHIELD

## 2015-06-08 LAB — BASIC METABOLIC PANEL
Anion gap: 13 (ref 5–15)
BUN: 71 mg/dL — ABNORMAL HIGH (ref 6–20)
CO2: 22 mmol/L (ref 22–32)
CREATININE: 2.59 mg/dL — AB (ref 0.44–1.00)
Calcium: 9.4 mg/dL (ref 8.9–10.3)
Chloride: 101 mmol/L (ref 101–111)
GFR calc non Af Amer: 19 mL/min — ABNORMAL LOW (ref >60)
GFR, EST AFRICAN AMERICAN: 22 mL/min — AB (ref >60)
GLUCOSE: 111 mg/dL — AB (ref 65–99)
POTASSIUM: 4.1 mmol/L (ref 3.5–5.1)
Sodium: 136 mmol/L (ref 135–145)

## 2015-06-08 LAB — GLUCOSE, CAPILLARY
GLUCOSE-CAPILLARY: 115 mg/dL — AB (ref 65–99)
GLUCOSE-CAPILLARY: 122 mg/dL — AB (ref 65–99)
GLUCOSE-CAPILLARY: 121 mg/dL — AB (ref 65–99)
Glucose-Capillary: 120 mg/dL — ABNORMAL HIGH (ref 65–99)

## 2015-06-08 LAB — CARBOXYHEMOGLOBIN
Carboxyhemoglobin: 1.2 % (ref 0.5–1.5)
Methemoglobin: 0.9 % (ref 0.0–1.5)
O2 SAT: 74.2 %
Total hemoglobin: 12 g/dL (ref 12.0–16.0)

## 2015-06-08 LAB — TROPONIN I: TROPONIN I: 0.09 ng/mL — AB (ref <0.031)

## 2015-06-08 MED ORDER — FUROSEMIDE 10 MG/ML IJ SOLN
80.0000 mg | Freq: Three times a day (TID) | INTRAMUSCULAR | Status: DC
  Administered 2015-06-08 – 2015-06-09 (×2): 80 mg via INTRAVENOUS
  Filled 2015-06-08 (×3): qty 8

## 2015-06-08 MED ORDER — METOLAZONE 5 MG PO TABS
5.0000 mg | ORAL_TABLET | Freq: Once | ORAL | Status: AC
  Administered 2015-06-08: 5 mg via ORAL
  Filled 2015-06-08: qty 1

## 2015-06-08 NOTE — Progress Notes (Signed)
Utilization review complete. Quintara Bost RN CCM Case Mgmt phone [phone] 

## 2015-06-08 NOTE — Progress Notes (Signed)
Nutrition Brief Note  Patient identified on the Malnutrition Screening Tool (MST) Report  Wt Readings from Last 15 Encounters:  06/08/15 174 lb 8 oz (79.153 kg)  06/19/2015 174 lb 8 oz (79.153 kg)  05/31/15 179 lb (81.194 kg)  04/13/15 172 lb (78.019 kg)  03/26/15 181 lb 4.8 oz (82.237 kg)  03/15/15 178 lb 8 oz (80.967 kg)  02/03/15 180 lb (81.647 kg)  01/17/15 177 lb 3.2 oz (80.377 kg)  12/13/14 184 lb 7 oz (83.66 kg)  11/15/14 188 lb 12.8 oz (85.639 kg)  11/08/14 187 lb (84.823 kg)  11/08/14 187 lb 12 oz (85.163 kg)  10/11/14 188 lb 4 oz (85.39 kg)  09/13/14 184 lb (83.462 kg)  08/09/14 181 lb (82.101 kg)  03/23/14 174 lbs   Body mass index is 34.08 kg/(m^2). Patient meets criteria for Obesity based on current BMI.  Pt's weight is stable.  Current diet order is 2 Gram Sodium, patient is consuming approximately 50-100% of meals at this time. Labs and medications reviewed.   No nutrition interventions warranted at this time. If nutrition issues arise, please consult RD.   Ian Malkin RD, LDN Inpatient Clinical Dietitian Pager: [phone] After Hours Pager: [phone]

## 2015-06-08 NOTE — Progress Notes (Signed)
I received call from pt. That she was having trouble breathing. I entered room and pt. Was now sitting on side of bed and her family member advised that she couldn't breathe well lying down but felt better when she sat up. I checked pt.'s oxygen saturation which was 98-100%. I paged attending on call and they advised to give 5 mg of Metolazone to help with her diuresing since she received 80 mg of Lasix IV this a.m. Medication will be given to pt. Once sent from pharmacy. Pt. Verbalized she felt better. I also advised attending of pt.'s 300 ml of urine output so far for the shift at about 12:30.

## 2015-06-08 NOTE — Progress Notes (Signed)
Advanced Heart Failure Rounding Note   Subjective:    Admitted from HF clinic with increased dyspnea and volume overload. No urine output over night.   Complaining of poor appetite, nausea and abdominal pain.    CO-OX 74%  Creatinine 2.59    Objective:   Weight Range:  Vital Signs:   Temp:  [97.4 F (36.3 C)-97.7 F (36.5 C)] 97.7 F (36.5 C) (06/08 0642) Pulse Rate:  [116-123] 123 (06/08 0642) Resp:  [20-22] 20 (06/08 0642) BP: (102-110)/(66-82) 110/77 mmHg (06/08 0642) SpO2:  [94 %-98 %] 98 % (06/08 0642) Weight:  [173 lb 8 oz (78.7 kg)-174 lb 8 oz (79.153 kg)] 174 lb 8 oz (79.153 kg) (06/08 0642) Last BM Date: 06/19/2015  Weight change: Filed Weights   06/13/2015 1613 06/08/15 0642  Weight: 173 lb 8 oz (78.7 kg) 174 lb 8 oz (79.153 kg)    Intake/Output:   Intake/Output Summary (Last 24 hours) at 06/08/15 0821 Last data filed at 06/03/2015 1904  Gross per 24 hour  Intake    240 ml  Output    100 ml  Net    140 ml     PHYSICAL EXAM: General: Lying flat in bed. weak HEENT: normal Neck: supple. JVP to jaw . Carotids 2+ bilaterally; no bruits. No lymphadenopathy or thryomegaly appreciated. Cor: PMI normal. Tachy Regular No rubs, or murmurs. +S3 Lungs: clear Abdomen: soft, tender, nondistended. No hepatosplenomegaly. No bruits or masses. Good bowel sounds. Extremities: no cyanosis, clubbing, rash, R and LLE 1-2+ edema, ted hose on R and LLE Neurro: alert & orientedx3, cranial nerves grossly intact. Moves all 4 extremities w/o difficulty. Affect pleasant          Telemetry: Sinus Tach   Labs: Basic Metabolic Panel:  Recent Labs Lab 06/13/2015 2003 06/08/15 0420  NA 135 136  K 3.8 4.1  CL 102 101  CO2 20* 22  GLUCOSE 119* 111*  BUN 71* 71*  CREATININE 2.66* 2.59*  CALCIUM 9.1 9.4  MG 2.1  --     Liver Function Tests:  Recent Labs Lab 06/20/2015 2003  AST 18  ALT 9*  ALKPHOS 46  BILITOT 1.3*  PROT 7.4  ALBUMIN 3.3*   No results for  input(s): LIPASE, AMYLASE in the last 168 hours. No results for input(s): AMMONIA in the last 168 hours.  CBC:  Recent Labs Lab 06/23/2015 2003  WBC 4.6  NEUTROABS 2.9  HGB 12.0  HCT 35.0*  MCV 64.9*  PLT 143*    Cardiac Enzymes:  Recent Labs Lab 06/12/2015 2003 06/08/15 0420  TROPONINI 0.11* 0.09*    BNP: BNP (last 3 results)  Recent Labs  03/15/15 2150 03/22/15 1601 06/06/2015 1800  BNP 701.4* 930.9* 1217.7*    ProBNP (last 3 results)  Recent Labs  11/08/14 1800  PROBNP 2681.0*      Other results:  Imaging: Dg Chest 2 View  06/08/2015   CLINICAL DATA:  Vomiting this morning, weakness, chronic kidney disease, asthma, congestive are  EXAM: CHEST  2 VIEW  COMPARISON:  03/24/2015  FINDINGS: Stable significant enlarged cardiac silhouette. Severe vascular congestion. Opacity right lower lobe suggests a combination of pleural effusion and consolidation, stable. Persistent bilateral perihilar hazy opacity. Support devices unchanged.  IMPRESSION: Congestive heart failure unchanged.   Electronically Signed   By: Esperanza Heir M.D.   On: 06/08/2015 08:07      Medications:     Scheduled Medications: . calcitRIOL  0.25 mcg Oral Daily  . DULoxetine  60  mg Oral Daily  . enoxaparin (LOVENOX) injection  30 mg Subcutaneous Q24H  . feeding supplement (ENSURE ENLIVE)  237 mL Oral BID BM  . ferrous sulfate  325 mg Oral TID WC  . furosemide  80 mg Intravenous BID  . gabapentin  200 mg Oral QHS  . hydrALAZINE  25 mg Oral 3 times per day  . magnesium oxide  400 mg Oral TID  . pantoprazole  40 mg Oral Daily  . potassium chloride  20 mEq Oral BID  . rOPINIRole  2 mg Oral QHS  . sodium chloride  3 mL Intravenous Q12H  . zolpidem  5 mg Oral QHS     Infusions: . milrinone 0.375 mcg/kg/min (06/06/2015 1717)     PRN Medications:  sodium chloride, acetaminophen, nitroGLYCERIN, ondansetron (ZOFRAN) IV, ondansetron, polyethylene glycol, sodium chloride, sodium chloride,  traMADol-acetaminophen   Assessment/Plan    1. Acute Chronic Systolic HF, ICM s/p ICD; EF ~ 30-35% on milrinone 0.375 mcg (07/2014). Not interested in VAD.  - Chronic NYHA III-IV Pro BNPelevated. Troponin 0.11>0.09 CO-OX stable. Continue current dose of milrinone. Tachycardic today.  Continue to diuresed with IV lasix. Scan bladder. No urine output in 24 hours.  Continue current dose hydralazine. No IMDUR due to severe HAs in setting of known migraines. No Ace or spiro with CKD.  2. CKD stage III - Baseline creatinine running 1.4-1.6. Creatinine up. Watch carefully.  3. CAD S/P PCI with DES to prox RCA and stent to distal RCA in 2012. No s/s of ischemia.  4. PSVT - quiescent. continue amio 200 daily.   5. Diarrhea. - NO BMs in 24 hours 6. Abdominal Pain- KUB. Scan bladder. Check UA     Length of Stay: 1  CLEGG,AMY NP-C  06/08/2015, 8:21 AM  Advanced Heart Failure Team Pager [phone] (M-F; 7a - 4p)  Please contact [facility] for night-coverage after hours (4p -7a ) and weekends on [email or web address]  Patient seen and examined with Tonye Becket, NP. We discussed all aspects of the encounter. I agree with the assessment and plan as stated above.   Co-ox ok but having poor response to IV lasix. Will add metolazone. If not improving or renal function worsening will plan RHC in am.   Bensimhon, Daniel,MD 6:10 PM

## 2015-06-09 ENCOUNTER — Encounter (HOSPITAL_COMMUNITY): Admission: AD | Disposition: E | Payer: BLUE CROSS/BLUE SHIELD | Source: Ambulatory Visit | Attending: Internal Medicine

## 2015-06-09 DIAGNOSIS — I471 Supraventricular tachycardia: Secondary | ICD-10-CM

## 2015-06-09 HISTORY — PX: CARDIAC CATHETERIZATION: SHX172

## 2015-06-09 LAB — POCT I-STAT 3, VENOUS BLOOD GAS (G3P V)
Acid-base deficit: 5 mmol/L — ABNORMAL HIGH (ref 0.0–2.0)
Acid-base deficit: 5 mmol/L — ABNORMAL HIGH (ref 0.0–2.0)
Acid-base deficit: 5 mmol/L — ABNORMAL HIGH (ref 0.0–2.0)
Bicarbonate: 19.7 mEq/L — ABNORMAL LOW (ref 20.0–24.0)
Bicarbonate: 19.8 mEq/L — ABNORMAL LOW (ref 20.0–24.0)
Bicarbonate: 19.7 mEq/L — ABNORMAL LOW (ref 20.0–24.0)
O2 SAT: 45 %
O2 SAT: 49 %
O2 Saturation: 40 %
PCO2 VEN: 35.3 mmHg — AB (ref 45.0–50.0)
PCO2 VEN: 35.3 mmHg — AB (ref 45.0–50.0)
PO2 VEN: 27 mmHg — AB (ref 30.0–45.0)
TCO2: 21 mmol/L (ref 0–100)
TCO2: 21 mmol/L (ref 0–100)
TCO2: 21 mmol/L (ref 0–100)
pCO2, Ven: 35.2 mmHg — ABNORMAL LOW (ref 45.0–50.0)
pH, Ven: 7.355 — ABNORMAL HIGH (ref 7.250–7.300)
pH, Ven: 7.357 — ABNORMAL HIGH (ref 7.250–7.300)
pH, Ven: 7.356 — ABNORMAL HIGH (ref 7.250–7.300)
pO2, Ven: 24 mmHg — CL (ref 30.0–45.0)
pO2, Ven: 26 mmHg — CL (ref 30.0–45.0)

## 2015-06-09 LAB — BASIC METABOLIC PANEL
Anion gap: 12 (ref 5–15)
BUN: 77 mg/dL — AB (ref 6–20)
CALCIUM: 9.3 mg/dL (ref 8.9–10.3)
CHLORIDE: 102 mmol/L (ref 101–111)
CO2: 22 mmol/L (ref 22–32)
Creatinine, Ser: 3.03 mg/dL — ABNORMAL HIGH (ref 0.44–1.00)
GFR, EST AFRICAN AMERICAN: 18 mL/min — AB (ref >60)
GFR, EST NON AFRICAN AMERICAN: 15 mL/min — AB (ref >60)
GLUCOSE: 91 mg/dL (ref 65–99)
Potassium: 4.9 mmol/L (ref 3.5–5.1)
SODIUM: 136 mmol/L (ref 135–145)

## 2015-06-09 LAB — URINALYSIS, ROUTINE W REFLEX MICROSCOPIC
GLUCOSE, UA: NEGATIVE mg/dL
Hgb urine dipstick: NEGATIVE
Ketones, ur: NEGATIVE mg/dL
LEUKOCYTES UA: NEGATIVE
NITRITE: NEGATIVE
PH: 5 (ref 5.0–8.0)
PROTEIN: 100 mg/dL — AB
Specific Gravity, Urine: 1.012 (ref 1.005–1.030)
UROBILINOGEN UA: 0.2 mg/dL (ref 0.0–1.0)

## 2015-06-09 LAB — PROTIME-INR
INR: 1.45 (ref 0.00–1.49)
PROTHROMBIN TIME: 17.7 s — AB (ref 11.6–15.2)

## 2015-06-09 LAB — MRSA PCR SCREENING: MRSA by PCR: NEGATIVE

## 2015-06-09 LAB — CARBOXYHEMOGLOBIN
CARBOXYHEMOGLOBIN: 1.4 % (ref 0.5–1.5)
Carboxyhemoglobin: 1 % (ref 0.5–1.5)
Methemoglobin: 1.4 % (ref 0.0–1.5)
Methemoglobin: 1.1 % (ref 0.0–1.5)
O2 SAT: 70.9 %
O2 Saturation: 63.3 %
TOTAL HEMOGLOBIN: 12.1 g/dL (ref 12.0–16.0)
Total hemoglobin: 12.3 g/dL (ref 12.0–16.0)

## 2015-06-09 LAB — URINE MICROSCOPIC-ADD ON

## 2015-06-09 LAB — GLUCOSE, CAPILLARY
GLUCOSE-CAPILLARY: 98 mg/dL (ref 65–99)
Glucose-Capillary: 103 mg/dL — ABNORMAL HIGH (ref 65–99)

## 2015-06-09 SURGERY — RIGHT HEART CATH

## 2015-06-09 MED ORDER — SODIUM CHLORIDE 0.9 % IV SOLN
INTRAVENOUS | Status: DC
Start: 2015-06-10 — End: 2015-06-09

## 2015-06-09 MED ORDER — ASPIRIN 81 MG PO CHEW: 81.0000 mg | CHEWABLE_TABLET | ORAL | Status: DC

## 2015-06-09 MED ORDER — HEPARIN (PORCINE) IN NACL 2-0.9 UNIT/ML-% IJ SOLN
INTRAMUSCULAR | Status: AC
  Filled 2015-06-09: qty 500

## 2015-06-09 MED ORDER — SODIUM CHLORIDE 0.9 % IV SOLN: 250.0000 mL | INTRAVENOUS | Status: DC | PRN

## 2015-06-09 MED ORDER — DOBUTAMINE IN D5W 4-5 MG/ML-% IV SOLN: 2.5000 ug/kg/min | Freq: Once | INTRAVENOUS | Status: AC

## 2015-06-09 MED ORDER — FUROSEMIDE 10 MG/ML IJ SOLN
80.0000 mg | Freq: Once | INTRAMUSCULAR | Status: AC
  Administered 2015-06-09: 80 mg via INTRAVENOUS
  Filled 2015-06-09: qty 8

## 2015-06-09 MED ORDER — FUROSEMIDE 10 MG/ML IJ SOLN
30.0000 mg/h | INTRAVENOUS | Status: DC
  Administered 2015-06-09: 15 mg/h via INTRAVENOUS
  Administered 2015-06-10: 20 mg/h via INTRAVENOUS
  Administered 2015-06-10: 15 mg/h via INTRAVENOUS
  Administered 2015-06-11 (×2): 30 mg/h via INTRAVENOUS
  Filled 2015-06-09 (×11): qty 25

## 2015-06-09 MED ORDER — DOPAMINE-DEXTROSE 3.2-5 MG/ML-% IV SOLN: INTRAVENOUS | Status: DC | PRN

## 2015-06-09 MED ORDER — DOBUTAMINE IN D5W 4-5 MG/ML-% IV SOLN
5.0000 ug/kg/min | INTRAVENOUS | Status: DC
  Administered 2015-06-09 – 2015-06-25 (×11): 5 ug/kg/min via INTRAVENOUS
  Administered 2015-06-26: 7 ug/kg/min via INTRAVENOUS
  Filled 2015-06-09 (×10): qty 250

## 2015-06-09 MED ORDER — LIDOCAINE HCL (PF) 1 % IJ SOLN
INTRAMUSCULAR | Status: AC
  Filled 2015-06-09: qty 30

## 2015-06-09 MED ORDER — SODIUM CHLORIDE 0.9 % IJ SOLN: 3.0000 mL | INTRAMUSCULAR | Status: DC | PRN

## 2015-06-09 MED ORDER — SODIUM CHLORIDE 0.9 % IJ SOLN: 3.0000 mL | Freq: Two times a day (BID) | INTRAMUSCULAR | Status: DC

## 2015-06-09 SURGICAL SUPPLY — 5 items
CATH SWAN GANZ 7F STRAIGHT (CATHETERS) ×2 IMPLANT
KIT HEART RIGHT NAMIC (KITS) ×3 IMPLANT
PACK CARDIAC CATHETERIZATION (CUSTOM PROCEDURE TRAY) ×3 IMPLANT
SHEATH PINNACLE 7F 10CM (SHEATH) ×3 IMPLANT
TRANSDUCER W/STOPCOCK (MISCELLANEOUS) ×4 IMPLANT

## 2015-06-09 NOTE — Progress Notes (Signed)
Milrinone drip stopped per Dr. Prescott Gum order.

## 2015-06-09 NOTE — H&P (View-Only) (Signed)
Advanced Heart Failure Rounding Note   Subjective:    Admitted from HF clinic with increased dyspnea and volume overload.  Continues to complain of poor appetite, nausea and abdominal pain. Co-ox ok. Creatinine worse. Urine output remains poor.      Objective:   Weight Range:  Vital Signs:   Temp:  [97 F (36.1 C)-97.6 F (36.4 C)] 97.6 F (36.4 C) (06/09 0541) Pulse Rate:  [115-121] 117 (06/09 0541) Resp:  [18-20] 18 (06/09 0541) BP: (89-116)/(53-83) 102/65 mmHg (06/09 0541) SpO2:  [94 %-98 %] 96 % (06/09 0541) Weight:  [79.062 kg (174 lb 4.8 oz)] 79.062 kg (174 lb 4.8 oz) (06/09 0541) Last BM Date: Jun 27, 2015  Weight change: Filed Weights   06/27/15 1613 06/08/15 0642 06/25/2015 0541  Weight: 78.7 kg (173 lb 8 oz) 79.153 kg (174 lb 8 oz) 79.062 kg (174 lb 4.8 oz)    Intake/Output:   Intake/Output Summary (Last 24 hours) at 06/24/2015 0755 Last data filed at 06/20/2015 0700  Gross per 24 hour  Intake    630 ml  Output    725 ml  Net    -95 ml     PHYSICAL EXAM: General: Lying flat in bed. weak HEENT: normal Neck: supple. JVP to jaw . Carotids 2+ bilaterally; no bruits. No lymphadenopathy or thryomegaly appreciated. Cor: PMI normal. Tachy Regular No rubs, or murmurs. +S3 Lungs: clear Abdomen: soft, tender, nondistended. No hepatosplenomegaly. No bruits or masses. Good bowel sounds. Extremities: no cyanosis, clubbing, rash, R and LLE 1-2+ edema, ted hose on R and LLE Neurro: alert & orientedx3, cranial nerves grossly intact. Moves all 4 extremities w/o difficulty. Affect pleasant          Telemetry: Sinus Tach   Labs: Basic Metabolic Panel:  Recent Labs Lab 06/27/2015 2003 06/08/15 0420 06/08/2015 0539  NA 135 136 136  K 3.8 4.1 4.9  CL 102 101 102  CO2 20* 22 22  GLUCOSE 119* 111* 91  BUN 71* 71* 77*  CREATININE 2.66* 2.59* 3.03*  CALCIUM 9.1 9.4 9.3  MG 2.1  --   --     Liver Function Tests:  Recent Labs Lab 06-27-2015 2003  AST 18  ALT 9*   ALKPHOS 46  BILITOT 1.3*  PROT 7.4  ALBUMIN 3.3*   No results for input(s): LIPASE, AMYLASE in the last 168 hours. No results for input(s): AMMONIA in the last 168 hours.  CBC:  Recent Labs Lab 06-27-2015 2003  WBC 4.6  NEUTROABS 2.9  HGB 12.0  HCT 35.0*  MCV 64.9*  PLT 143*    Cardiac Enzymes:  Recent Labs Lab 06-27-15 2003 06/08/15 0420  TROPONINI 0.11* 0.09*    BNP: BNP (last 3 results)  Recent Labs  03/15/15 2150 03/22/15 1601 Jun 27, 2015 1800  BNP 701.4* 930.9* 1217.7*    ProBNP (last 3 results)  Recent Labs  11/08/14 1800  PROBNP 2681.0*      Other results:  Imaging: Dg Chest 2 View  06/08/2015   CLINICAL DATA:  Vomiting this morning, weakness, chronic kidney disease, asthma, congestive are  EXAM: CHEST  2 VIEW  COMPARISON:  03/24/2015  FINDINGS: Stable significant enlarged cardiac silhouette. Severe vascular congestion. Opacity right lower lobe suggests a combination of pleural effusion and consolidation, stable. Persistent bilateral perihilar hazy opacity. Support devices unchanged.  IMPRESSION: Congestive heart failure unchanged.   Electronically Signed   By: Esperanza Heir M.D.   On: 06/08/2015 08:07   Dg Abd Portable 1v  06/08/2015  CLINICAL DATA:  Abdominal pain and distension  EXAM: PORTABLE ABDOMEN - 1 VIEW  COMPARISON:  03/16/2015  FINDINGS: Scattered large and small bowel gas is noted. No abnormal mass or abnormal calcifications are seen. Phleboliths are noted in the pelvis in to the right of the midline. Significant degenerative change of the lumbar spine is again seen. No free air is noted.  IMPRESSION: No acute abnormality noted.   Electronically Signed   By: Alcide Clever M.D.   On: 06/08/2015 10:38     Medications:     Scheduled Medications: . calcitRIOL  0.25 mcg Oral Daily  . DULoxetine  60 mg Oral Daily  . enoxaparin (LOVENOX) injection  30 mg Subcutaneous Q24H  . ferrous sulfate  325 mg Oral TID WC  . furosemide  80 mg  Intravenous 3 times per day  . gabapentin  200 mg Oral QHS  . hydrALAZINE  25 mg Oral 3 times per day  . magnesium oxide  400 mg Oral TID  . pantoprazole  40 mg Oral Daily  . rOPINIRole  2 mg Oral QHS  . sodium chloride  3 mL Intravenous Q12H  . zolpidem  5 mg Oral QHS    Infusions: . milrinone 0.375 mcg/kg/min (06/13/2015 0643)    PRN Medications: sodium chloride, acetaminophen, nitroGLYCERIN, ondansetron (ZOFRAN) IV, ondansetron, polyethylene glycol, sodium chloride, sodium chloride, traMADol-acetaminophen   Assessment/Plan    1. Acute Chronic Systolic HF, ICM s/p ICD; EF ~ 30-35% on milrinone 0.375 mcg (07/2014). Not interested in VAD.  2. CAD S/P PCI with DES to prox RCA and stent to distal RCA in 2012. No s/s of ischemia.  3. PSVT - quiescent. continue amio 200 daily.   4. Abdominal Pain- KUB. Scan bladder. Check UA  5. Acute on chronic renal failure, stage IV   Co-ox ok but acting low output with fatigue, tachycardia, anorexia and worsening renal function. Will plan RHC today. Hold lasix for now.    Length of Stay: 2 [name] MD 06/11/2015, 7:55 AM Advanced Heart Failure Team Pager [phone] (M-F; 7a - 4p)  Please contact [facility] for night-coverage after hours (4p -7a ) and weekends on [email or web address]  \

## 2015-06-09 NOTE — Progress Notes (Signed)
Cath Lab called to verify pt. Ready to come down for her Right Heart Cath Procedure. I advised Cath Lab that pt. Was ready to come to Cath Lab, Consent was completed, Pt. Had watched Cath Lab Video, and had been NPO since breakfast. Pt. Was stable and had no complaints of distress or discomfort. Cath Lab transport declined V/S being done prior to procedure because they would take them in the Cath Lab.

## 2015-06-09 NOTE — Interval H&P Note (Signed)
History and Physical Interval Note:  26-Jun-2015 2:01 PM  Andrea [name]  has presented today for surgery, with the diagnosis of hf  The various methods of treatment have been discussed with the patient and family. After consideration of risks, benefits and other options for treatment, the patient has consented to  Procedure(s): Right Heart Cath (N/A) as a surgical intervention .  The patient's history has been reviewed, patient examined, no change in status, stable for surgery.  I have reviewed the patient's chart and labs.  Questions were answered to the patient's satisfaction.     Lycan Davee, Reuel Boom

## 2015-06-09 NOTE — Progress Notes (Signed)
Advanced Heart Failure Rounding Note   Subjective:    Admitted from HF clinic with increased dyspnea and volume overload.  Continues to complain of poor appetite, nausea and abdominal pain. Co-ox ok. Creatinine worse. Urine output remains poor.      Objective:   Weight Range:  Vital Signs:   Temp:  [97 F (36.1 C)-97.6 F (36.4 C)] 97.6 F (36.4 C) (06/09 0541) Pulse Rate:  [115-121] 117 (06/09 0541) Resp:  [18-20] 18 (06/09 0541) BP: (89-116)/(53-83) 102/65 mmHg (06/09 0541) SpO2:  [94 %-98 %] 96 % (06/09 0541) Weight:  [79.062 kg (174 lb 4.8 oz)] 79.062 kg (174 lb 4.8 oz) (06/09 0541) Last BM Date: 06/14/2015  Weight change: Filed Weights   06/08/2015 1613 06/08/15 0642 06/23/2015 0541  Weight: 78.7 kg (173 lb 8 oz) 79.153 kg (174 lb 8 oz) 79.062 kg (174 lb 4.8 oz)    Intake/Output:   Intake/Output Summary (Last 24 hours) at 06/15/2015 0755 Last data filed at 06/18/2015 0700  Gross per 24 hour  Intake    630 ml  Output    725 ml  Net    -95 ml     PHYSICAL EXAM: General: Lying flat in bed. weak HEENT: normal Neck: supple. JVP to jaw . Carotids 2+ bilaterally; no bruits. No lymphadenopathy or thryomegaly appreciated. Cor: PMI normal. Tachy Regular No rubs, or murmurs. +S3 Lungs: clear Abdomen: soft, tender, nondistended. No hepatosplenomegaly. No bruits or masses. Good bowel sounds. Extremities: no cyanosis, clubbing, rash, R and LLE 1-2+ edema, ted hose on R and LLE Neurro: alert & orientedx3, cranial nerves grossly intact. Moves all 4 extremities w/o difficulty. Affect pleasant          Telemetry: Sinus Tach   Labs: Basic Metabolic Panel:  Recent Labs Lab 06/05/2015 2003 06/08/15 0420 06/09/15 0539  NA 135 136 136  K 3.8 4.1 4.9  CL 102 101 102  CO2 20* 22 22  GLUCOSE 119* 111* 91  BUN 71* 71* 77*  CREATININE 2.66* 2.59* 3.03*  CALCIUM 9.1 9.4 9.3  MG 2.1  --   --     Liver Function Tests:  Recent Labs Lab 06/29/2015 2003  AST 18  ALT 9*   ALKPHOS 46  BILITOT 1.3*  PROT 7.4  ALBUMIN 3.3*   No results for input(s): LIPASE, AMYLASE in the last 168 hours. No results for input(s): AMMONIA in the last 168 hours.  CBC:  Recent Labs Lab 06/10/2015 2003  WBC 4.6  NEUTROABS 2.9  HGB 12.0  HCT 35.0*  MCV 64.9*  PLT 143*    Cardiac Enzymes:  Recent Labs Lab 06/10/2015 2003 06/08/15 0420  TROPONINI 0.11* 0.09*    BNP: BNP (last 3 results)  Recent Labs  03/15/15 2150 03/22/15 1601 06/22/2015 1800  BNP 701.4* 930.9* 1217.7*    ProBNP (last 3 results)  Recent Labs  11/08/14 1800  PROBNP 2681.0*      Other results:  Imaging: Dg Chest 2 View  06/08/2015   CLINICAL DATA:  Vomiting this morning, weakness, chronic kidney disease, asthma, congestive are  EXAM: CHEST  2 VIEW  COMPARISON:  03/24/2015  FINDINGS: Stable significant enlarged cardiac silhouette. Severe vascular congestion. Opacity right lower lobe suggests a combination of pleural effusion and consolidation, stable. Persistent bilateral perihilar hazy opacity. Support devices unchanged.  IMPRESSION: Congestive heart failure unchanged.   Electronically Signed   By: Raymond  Rubner M.D.   On: 06/08/2015 08:07   Dg Abd Portable 1v  06/08/2015     CLINICAL DATA:  Abdominal pain and distension  EXAM: PORTABLE ABDOMEN - 1 VIEW  COMPARISON:  03/16/2015  FINDINGS: Scattered large and small bowel gas is noted. No abnormal mass or abnormal calcifications are seen. Phleboliths are noted in the pelvis in to the right of the midline. Significant degenerative change of the lumbar spine is again seen. No free air is noted.  IMPRESSION: No acute abnormality noted.   Electronically Signed   By: Mark  Lukens M.D.   On: 06/08/2015 10:38     Medications:     Scheduled Medications: . calcitRIOL  0.25 mcg Oral Daily  . DULoxetine  60 mg Oral Daily  . enoxaparin (LOVENOX) injection  30 mg Subcutaneous Q24H  . ferrous sulfate  325 mg Oral TID WC  . furosemide  80 mg  Intravenous 3 times per day  . gabapentin  200 mg Oral QHS  . hydrALAZINE  25 mg Oral 3 times per day  . magnesium oxide  400 mg Oral TID  . pantoprazole  40 mg Oral Daily  . rOPINIRole  2 mg Oral QHS  . sodium chloride  3 mL Intravenous Q12H  . zolpidem  5 mg Oral QHS    Infusions: . milrinone 0.375 mcg/kg/min (06/27/2015 0643)    PRN Medications: sodium chloride, acetaminophen, nitroGLYCERIN, ondansetron (ZOFRAN) IV, ondansetron, polyethylene glycol, sodium chloride, sodium chloride, traMADol-acetaminophen   Assessment/Plan    1. Acute Chronic Systolic HF, ICM s/p ICD; EF ~ 30-35% on milrinone 0.375 mcg (07/2014). Not interested in VAD.  2. CAD S/P PCI with DES to prox RCA and stent to distal RCA in 2012. No s/s of ischemia.  3. PSVT - quiescent. continue amio 200 daily.   4. Abdominal Pain- KUB. Scan bladder. Check UA  5. Acute on chronic renal failure, stage IV   Co-ox ok but acting low output with fatigue, tachycardia, anorexia and worsening renal function. Will plan RHC today. Hold lasix for now.    Length of Stay: 2 Drezden Seitzinger MD 06/08/2015, 7:55 AM Advanced Heart Failure Team Pager [phone] (M-F; 7a - 4p)  Please contact [facility] for night-coverage after hours (4p -7a ) and weekends on [email or web address]  \  

## 2015-06-09 NOTE — Progress Notes (Signed)
I was informed by Unit Secretary that pt. Would not be returning to the unit and Secretary packed pt.'s belongings and took pt.'s belongings to the Cath Lab. Pt.'s Spouse notified in person of where to find pt.

## 2015-06-09 NOTE — Progress Notes (Signed)
Patient c/o full bladder with foley catheter in place and proximately 100cc urine in the collection bag. Bladder scanner done showing about 300-445ml of urine in bladder. Catheter irrigated with minimal return of urine. Patient very uncomfortable with irrigation. PA paged and order for catheter replacement and UA given. Replaced 91F foley with 31F foley with very minimal urine. Patient again c/o being very uncomfortable and of having full bladder, she requested to have her foley removed stating that she will try to urinate on bedside commode. This was unsuccessful as she only voided >62ml of urine. MD on call paged. Order for in/out cath - again unsuccessful only very minimal urine return. Patient again tried to void on [facility] w/o any success. She requested to go back to bed. Did not want to have any other interventions done at this time. MD paged again, will re-scan her bladder in 2 hrs (midnight) and if still large amounts of urine present we will re-insert larger foley catheter.

## 2015-06-10 ENCOUNTER — Inpatient Hospital Stay (HOSPITAL_COMMUNITY): Payer: BLUE CROSS/BLUE SHIELD

## 2015-06-10 ENCOUNTER — Encounter (HOSPITAL_COMMUNITY): Admission: AD | Disposition: E | Payer: BLUE CROSS/BLUE SHIELD | Source: Ambulatory Visit | Attending: Internal Medicine

## 2015-06-10 ENCOUNTER — Encounter (HOSPITAL_COMMUNITY): Payer: Self-pay | Admitting: Internal Medicine

## 2015-06-10 DIAGNOSIS — I509 Heart failure, unspecified: Secondary | ICD-10-CM

## 2015-06-10 HISTORY — PX: CARDIAC CATHETERIZATION: SHX172

## 2015-06-10 LAB — POCT I-STAT 3, VENOUS BLOOD GAS (G3P V)
Acid-base deficit: 6 mmol/L — ABNORMAL HIGH (ref 0.0–2.0)
Acid-base deficit: 6 mmol/L — ABNORMAL HIGH (ref 0.0–2.0)
BICARBONATE: 19.2 meq/L — AB (ref 20.0–24.0)
BICARBONATE: 19.2 meq/L — AB (ref 20.0–24.0)
O2 SAT: 45 %
O2 SAT: 50 %
PH VEN: 7.331 — AB (ref 7.250–7.300)
PO2 VEN: 26 mmHg — AB (ref 30.0–45.0)
PO2 VEN: 29 mmHg — AB (ref 30.0–45.0)
TCO2: 20 mmol/L (ref 0–100)
TCO2: 20 mmol/L (ref 0–100)
pCO2, Ven: 36.4 mmHg — ABNORMAL LOW (ref 45.0–50.0)
pCO2, Ven: 36.6 mmHg — ABNORMAL LOW (ref 45.0–50.0)
pH, Ven: 7.329 — ABNORMAL HIGH (ref 7.250–7.300)

## 2015-06-10 LAB — BASIC METABOLIC PANEL
ANION GAP: 15 (ref 5–15)
ANION GAP: 14 (ref 5–15)
BUN: 75 mg/dL — AB (ref 6–20)
BUN: 84 mg/dL — ABNORMAL HIGH (ref 6–20)
CALCIUM: 9.1 mg/dL (ref 8.9–10.3)
CO2: 19 mmol/L — ABNORMAL LOW (ref 22–32)
CO2: 19 mmol/L — ABNORMAL LOW (ref 22–32)
CREATININE: 3.64 mg/dL — AB (ref 0.44–1.00)
CREATININE: 3.82 mg/dL — AB (ref 0.44–1.00)
Calcium: 9.4 mg/dL (ref 8.9–10.3)
Chloride: 101 mmol/L (ref 101–111)
Chloride: 100 mmol/L — ABNORMAL LOW (ref 101–111)
GFR calc Af Amer: 14 mL/min — ABNORMAL LOW (ref >60)
GFR calc non Af Amer: 12 mL/min — ABNORMAL LOW (ref >60)
GFR calc non Af Amer: 12 mL/min — ABNORMAL LOW (ref >60)
GFR, EST AFRICAN AMERICAN: 13 mL/min — AB (ref >60)
GLUCOSE: 124 mg/dL — AB (ref 65–99)
Glucose, Bld: 172 mg/dL — ABNORMAL HIGH (ref 65–99)
POTASSIUM: 5.2 mmol/L — AB (ref 3.5–5.1)
Potassium: 5.3 mmol/L — ABNORMAL HIGH (ref 3.5–5.1)
Sodium: 135 mmol/L (ref 135–145)
Sodium: 133 mmol/L — ABNORMAL LOW (ref 135–145)

## 2015-06-10 LAB — CARBOXYHEMOGLOBIN
CARBOXYHEMOGLOBIN: 1.1 % (ref 0.5–1.5)
CARBOXYHEMOGLOBIN: 1 % (ref 0.5–1.5)
METHEMOGLOBIN: 1 % (ref 0.0–1.5)
METHEMOGLOBIN: 1 % (ref 0.0–1.5)
O2 Saturation: 59.6 %
O2 Saturation: 63 %
TOTAL HEMOGLOBIN: 11.7 g/dL — AB (ref 12.0–16.0)
TOTAL HEMOGLOBIN: 12.1 g/dL (ref 12.0–16.0)

## 2015-06-10 LAB — GLUCOSE, CAPILLARY: GLUCOSE-CAPILLARY: 95 mg/dL (ref 65–99)

## 2015-06-10 LAB — SURGICAL PCR SCREEN
MRSA, PCR: NEGATIVE
Staphylococcus aureus: POSITIVE — AB

## 2015-06-10 LAB — HEPARIN LEVEL (UNFRACTIONATED): Heparin Unfractionated: 0.18 IU/mL — ABNORMAL LOW (ref 0.30–0.70)

## 2015-06-10 SURGERY — IABP INSERTION

## 2015-06-10 MED ORDER — NOREPINEPHRINE BITARTRATE 1 MG/ML IV SOLN
2.0000 ug/min | INTRAVENOUS | Status: DC
  Administered 2015-06-10: 5 ug/min via INTRAVENOUS
  Administered 2015-06-12 – 2015-06-13 (×2): 10 ug/min via INTRAVENOUS
  Administered 2015-06-14 – 2015-06-15 (×2): 8 ug/min via INTRAVENOUS
  Administered 2015-06-16 – 2015-06-18 (×4): 10 ug/min via INTRAVENOUS
  Administered 2015-06-19 – 2015-06-20 (×2): 9 ug/min via INTRAVENOUS
  Administered 2015-06-21 – 2015-06-22 (×2): 12 ug/min via INTRAVENOUS
  Administered 2015-06-23 – 2015-06-24 (×2): 15 ug/min via INTRAVENOUS
  Administered 2015-06-25: 12 ug/min via INTRAVENOUS
  Administered 2015-06-25: 14 ug/min via INTRAVENOUS
  Administered 2015-06-26: 11 ug/min via INTRAVENOUS
  Administered 2015-06-27 – 2015-06-29 (×3): 13 ug/min via INTRAVENOUS
  Administered 2015-06-30: 20 ug/min via INTRAVENOUS
  Administered 2015-06-30: 13 ug/min via INTRAVENOUS
  Administered 2015-07-02: 25 ug/min via INTRAVENOUS
  Filled 2015-06-10 (×29): qty 16

## 2015-06-10 MED ORDER — HEPARIN SODIUM (PORCINE) 1000 UNIT/ML IJ SOLN
INTRAMUSCULAR | Status: AC
  Filled 2015-06-10: qty 1

## 2015-06-10 MED ORDER — SODIUM CHLORIDE 0.9 % IJ SOLN: 3.0000 mL | INTRAMUSCULAR | Status: DC | PRN

## 2015-06-10 MED ORDER — LIDOCAINE HCL (PF) 1 % IJ SOLN
INTRAMUSCULAR | Status: AC
  Filled 2015-06-10: qty 60

## 2015-06-10 MED ORDER — SODIUM CHLORIDE 0.9 % IJ SOLN
10.0000 mL | Freq: Two times a day (BID) | INTRAMUSCULAR | Status: DC
Start: 2015-06-10 — End: 2015-07-04
  Administered 2015-06-11 – 2015-07-04 (×29): 10 mL

## 2015-06-10 MED ORDER — SODIUM CHLORIDE 0.9 % IJ SOLN: 3.0000 mL | Freq: Two times a day (BID) | INTRAMUSCULAR | Status: DC

## 2015-06-10 MED ORDER — SODIUM CHLORIDE 0.9 % IJ SOLN
10.0000 mL | INTRAMUSCULAR | Status: DC | PRN
Start: 2015-06-10 — End: 2015-07-04

## 2015-06-10 MED ORDER — IOHEXOL 350 MG/ML SOLN
INTRAVENOUS | Status: DC | PRN
  Administered 2015-06-10: 10 mL via INTRA_ARTERIAL

## 2015-06-10 MED ORDER — HEPARIN SODIUM (PORCINE) 1000 UNIT/ML IJ SOLN
INTRAMUSCULAR | Status: DC | PRN
  Administered 2015-06-10: 4000 [IU] via INTRAVENOUS

## 2015-06-10 MED ORDER — LIDOCAINE HCL (PF) 1 % IJ SOLN
INTRAMUSCULAR | Status: DC | PRN
  Administered 2015-06-10: 20 mL via INTRADERMAL
  Administered 2015-06-10: 10 mL via INTRADERMAL
  Administered 2015-06-10: 20 mL via INTRADERMAL

## 2015-06-10 MED ORDER — NOREPINEPHRINE BITARTRATE 1 MG/ML IV SOLN
INTRAVENOUS | Status: DC | PRN
  Administered 2015-06-10: 5 ug via INTRAVENOUS

## 2015-06-10 MED ORDER — HEPARIN (PORCINE) IN NACL 2-0.9 UNIT/ML-% IJ SOLN
INTRAMUSCULAR | Status: AC
  Filled 2015-06-10: qty 1000

## 2015-06-10 MED ORDER — HEPARIN (PORCINE) IN NACL 2-0.9 UNIT/ML-% IJ SOLN
INTRAMUSCULAR | Status: AC
  Filled 2015-06-10: qty 500

## 2015-06-10 MED ORDER — CETYLPYRIDINIUM CHLORIDE 0.05 % MT LIQD
7.0000 mL | Freq: Two times a day (BID) | OROMUCOSAL | Status: DC
  Administered 2015-06-11 – 2015-07-04 (×44): 7 mL via OROMUCOSAL

## 2015-06-10 MED ORDER — ONDANSETRON HCL 4 MG/2ML IJ SOLN: 4.0000 mg | Freq: Four times a day (QID) | INTRAMUSCULAR | Status: DC | PRN

## 2015-06-10 MED ORDER — SODIUM CHLORIDE 0.9 % IV SOLN: 250.0000 mL | INTRAVENOUS | Status: DC | PRN

## 2015-06-10 MED ORDER — NOREPINEPHRINE BITARTRATE 1 MG/ML IV SOLN
INTRAVENOUS | Status: AC
  Filled 2015-06-10: qty 4

## 2015-06-10 MED ORDER — HEPARIN (PORCINE) IN NACL 100-0.45 UNIT/ML-% IJ SOLN
1250.0000 [IU]/h | INTRAMUSCULAR | Status: DC
  Administered 2015-06-10: 700 [IU]/h via INTRAVENOUS
  Administered 2015-06-11: 900 [IU]/h via INTRAVENOUS
  Administered 2015-06-12: 1100 [IU]/h via INTRAVENOUS
  Administered 2015-06-13 – 2015-06-14 (×2): 1250 [IU]/h via INTRAVENOUS
  Filled 2015-06-10 (×8): qty 250

## 2015-06-10 MED ORDER — ACETAMINOPHEN 325 MG PO TABS: 650.0000 mg | ORAL_TABLET | ORAL | Status: DC | PRN

## 2015-06-10 MED ORDER — NOREPINEPHRINE BITARTRATE 1 MG/ML IV SOLN
2.0000 ug/min | INTRAVENOUS | Status: DC
Start: 2015-06-10 — End: 2015-06-10
  Administered 2015-06-10: 5 ug/min via INTRAVENOUS
  Filled 2015-06-10: qty 4

## 2015-06-10 MED FILL — Heparin Sodium (Porcine) 2 Unit/ML in Sodium Chloride 0.9%: INTRAMUSCULAR | Qty: 500 | Status: AC

## 2015-06-10 MED FILL — Lidocaine HCl Local Preservative Free (PF) Inj 1%: INTRAMUSCULAR | Qty: 30 | Status: AC

## 2015-06-10 MED FILL — Dopamine in Dextrose 5% Inj 3.2 MG/ML: INTRAVENOUS | Qty: 250 | Status: AC

## 2015-06-10 SURGICAL SUPPLY — 13 items
BALLN LINEAR 7.5FR IABP 34CC (BALLOONS) ×3
BALLOON LINEAR 7.5FR IABP 34CC (BALLOONS) IMPLANT
CATH SWAN VIP NON-HEP 7.5F (CATHETERS) ×2 IMPLANT
DEVICE SECURE STATLOCK IABP (MISCELLANEOUS) ×6 IMPLANT
KIT HEART LEFT (KITS) ×2 IMPLANT
KIT HEART RIGHT NAMIC (KITS) ×2 IMPLANT
PACK CARDIAC CATHETERIZATION (CUSTOM PROCEDURE TRAY) ×2 IMPLANT
SHEATH PINNACLE 5F 10CM (SHEATH) ×4 IMPLANT
SHEATH PINNACLE 8F 10CM (SHEATH) ×2 IMPLANT
SLEEVE REPOSITIONING LENGTH 30 (MISCELLANEOUS) ×2 IMPLANT
TUBING ART PRESS 72  MALE/FEM (TUBING) ×2
TUBING ART PRESS 72 MALE/FEM (TUBING) IMPLANT
WIRE EMERALD 3MM-J .035X150CM (WIRE) ×2 IMPLANT

## 2015-06-10 NOTE — Progress Notes (Signed)
Advanced Heart Failure Rounding Note   Subjective:    Admitted from HF clinic with increased dyspnea and volume overload.  Yesterday she was transferred to stepdown post cath due to cardiogenic shock. Milrinone stopped and dobutamine started 5 mcg. CO-OX this morning 59%. Poor urine output over night despite lasix drip at 15 mg per hour.   Complaining of fatigue. Dyspnea at rest.   Creatinine 3.0>3.6   RHC 06/08/2015 RA = 25 RV = 56/25 PA = 62/24 (43) PCW = 25 Fick cardiac output/index = 2.6/1.5 Thermo CO/CI = 3.8/2.2 PVR = 6.9 WU FA sat = 95% PA sat = 40%, 45% SVC sat via PICC = 49%  Conclusion:  1. Decompensated HF with cardiogenic shock physiology and marked volume overload  Objective:   Weight Range:  Vital Signs:   Temp:  [97.7 F (36.5 C)-98.5 F (36.9 C)] 97.8 F (36.6 C) (06/10 0500) Pulse Rate:  [0-180] 103 (06/10 0500) Resp:  [0-38] 22 (06/10 0500) BP: (84-119)/(45-79) 104/62 mmHg (06/10 0500) SpO2:  [0 %-100 %] 98 % (06/10 0500) Weight:  [175 lb 14.8 oz (79.8 kg)-178 lb 1.6 oz (80.786 kg)] 178 lb 1.6 oz (80.786 kg) (06/10 0500) Last BM Date: 06-30-15  Weight change: Filed Weights   06/25/2015 0541 06/30/2015 1652 06/15/2015 0500  Weight: 174 lb 4.8 oz (79.062 kg) 175 lb 14.8 oz (79.8 kg) 178 lb 1.6 oz (80.786 kg)    Intake/Output:   Intake/Output Summary (Last 24 hours) at 06/29/2015 0706 Last data filed at 06/08/2015 0500  Gross per 24 hour  Intake 623.34 ml  Output    270 ml  Net 353.34 ml     PHYSICAL EXAM: General: Lying flat in bed. weak HEENT: normal Neck: supple. JVP to jaw . Carotids 2+ bilaterally; no bruits. No lymphadenopathy or thryomegaly appreciated. Cor: PMI normal. Tachy Regular No rubs, or murmurs. +S3 Lungs: clear Abdomen: soft, tender, nondistended. No hepatosplenomegaly. No bruits or masses. Good bowel sounds. Extremities: no cyanosis, clubbing, rash, R and LLE 1-2+ edema, ted hose on R and LLE Neurro: alert & orientedx3,  cranial nerves grossly intact. Moves all 4 extremities w/o difficulty. Affect pleasant          Telemetry: Sinus Tach   Labs: Basic Metabolic Panel:  Recent Labs Lab June 30, 2015 2003 06/08/15 0420 06/13/2015 0539 06/29/2015 0450  NA 135 136 136 135  K 3.8 4.1 4.9 5.2*  CL 102 101 102 101  CO2 20* 22 22 19*  GLUCOSE 119* 111* 91 124*  BUN 71* 71* 77* 75*  CREATININE 2.66* 2.59* 3.03* 3.64*  CALCIUM 9.1 9.4 9.3 9.4  MG 2.1  --   --   --     Liver Function Tests:  Recent Labs Lab 2015-06-30 2003  AST 18  ALT 9*  ALKPHOS 46  BILITOT 1.3*  PROT 7.4  ALBUMIN 3.3*   No results for input(s): LIPASE, AMYLASE in the last 168 hours. No results for input(s): AMMONIA in the last 168 hours.  CBC:  Recent Labs Lab 06-30-2015 2003  WBC 4.6  NEUTROABS 2.9  HGB 12.0  HCT 35.0*  MCV 64.9*  PLT 143*    Cardiac Enzymes:  Recent Labs Lab 06-30-15 2003 06/08/15 0420  TROPONINI 0.11* 0.09*    BNP: BNP (last 3 results)  Recent Labs  03/15/15 2150 03/22/15 1601 30-Jun-2015 1800  BNP 701.4* 930.9* 1217.7*    ProBNP (last 3 results)  Recent Labs  11/08/14 1800  PROBNP 2681.0*      Other  results:  Imaging: Dg Chest 2 View  06/08/2015   CLINICAL DATA:  Vomiting this morning, weakness, chronic kidney disease, asthma, congestive are  EXAM: CHEST  2 VIEW  COMPARISON:  03/24/2015  FINDINGS: Stable significant enlarged cardiac silhouette. Severe vascular congestion. Opacity right lower lobe suggests a combination of pleural effusion and consolidation, stable. Persistent bilateral perihilar hazy opacity. Support devices unchanged.  IMPRESSION: Congestive heart failure unchanged.   Electronically Signed   By: Esperanza Heir M.D.   On: 06/08/2015 08:07   Dg Abd Portable 1v  06/08/2015   CLINICAL DATA:  Abdominal pain and distension  EXAM: PORTABLE ABDOMEN - 1 VIEW  COMPARISON:  03/16/2015  FINDINGS: Scattered large and small bowel gas is noted. No abnormal mass or abnormal  calcifications are seen. Phleboliths are noted in the pelvis in to the right of the midline. Significant degenerative change of the lumbar spine is again seen. No free air is noted.  IMPRESSION: No acute abnormality noted.   Electronically Signed   By: Alcide Clever M.D.   On: 06/08/2015 10:38     Medications:     Scheduled Medications: . calcitRIOL  0.25 mcg Oral Daily  . DULoxetine  60 mg Oral Daily  . enoxaparin (LOVENOX) injection  30 mg Subcutaneous Q24H  . ferrous sulfate  325 mg Oral TID WC  . gabapentin  200 mg Oral QHS  . hydrALAZINE  25 mg Oral 3 times per day  . magnesium oxide  400 mg Oral TID  . pantoprazole  40 mg Oral Daily  . rOPINIRole  2 mg Oral QHS  . sodium chloride  3 mL Intravenous Q12H  . zolpidem  5 mg Oral QHS    Infusions: . DOBUTamine 5 mcg/kg/min (06/15/2015 1655)  . furosemide (LASIX) infusion 15 mg/hr (06/19/2015 1723)    PRN Medications: sodium chloride, acetaminophen, nitroGLYCERIN, ondansetron (ZOFRAN) IV, ondansetron, polyethylene glycol, sodium chloride, sodium chloride, traMADol-acetaminophen   Assessment/Plan    1. Acute Chronic Systolic HF, ICM s/p ICD; EF ~ 30-35% on milrinone 0.375 mcg (07/2014). Asking about transplant.  RHC yesterday showed cardiogenic and volume overload. Milrinone stopped and she switched to dobutamine at 5 mcg. CO-OX 59%. Continue lasix drip  per hour. Worsening renal function today. Poor urine output noted. No urine with bladder scan and ultrasound. Set up CVP. She would like to consider advanced therapies.  2. CAD S/P PCI with DES to prox RCA and stent to distal RCA in 2012. No s/s of ischemia.  3. PSVT - quiescent. continue ami4. Abdominal Pain- KUB. Ok. o 200 daily.   4. Acute on chronic renal failure, stage IV  Length of Stay: 3 CLEGG,AMY NP-C  06/30/2015, 7:06 AM Advanced Heart Failure Team Pager [phone] (M-F; 7a - 4p)  Please contact [facility] for night-coverage after hours (4p -7a ) and weekends on  [email or web address]  Patient seen and examined with Tonye Becket, NP. We discussed all aspects of the encounter. I agree with the assessment and plan as stated above.   Co-ox slightly better in switching from milrinone to dobutamine but urine output poor and renal functioning worsening. She seems to have progressive shock. I had long talk with her and her partner about options. At this point, she is failing inotropic support and only options would be 1) to proceed with IABP and Swan placement with eye toward VAD if her renal function improves or 2) Palliative care. They understand that renal function would have to get significantly better and that she would  have to be approved for VAD to proceed to that level. They want to discuss a bit more and will let us know. Will continue dobutamine for now with lasix gtt.   .The patient is critically ill with multiple organ systems failure and requires high complexity decision making for assessment and support, frequent evaluation and titration of therapies, application of advanced monitoring technologies and extensive interpretation of multiple databases.   Critical Care Time devoted to patient care services described in this note is 45 Minutes.  Bensimhon, Daniel,MD 8:00 AM

## 2015-06-10 NOTE — Progress Notes (Signed)
ANTICOAGULATION CONSULT NOTE - Initial Consult  Pharmacy Consult for Heparin  Indication: IABP  Allergies  Allergen Reactions  . Aspirin Shortness Of Breath and Other (See Comments)    Asthma symptoms  . Brilinta [Ticagrelor] Shortness Of Breath and Other (See Comments)    Gout   . Percocet [Oxycodone-Acetaminophen] Nausea And Vomiting    Patient can tolerate acetaminophen solely  . Darvon Nausea And Vomiting  . Diovan [Valsartan] Other (See Comments) and Nausea Only    unknown  . Imitrex [Sumatriptan Base] Other (See Comments)    Asthma symptoms, shortness of breath  . Licorice Flavor [Artificial Licorice Flavor]     Black licorice induces asthma   . Nsaids Nausea And Vomiting and Other (See Comments)    GI upset   . Tape Other (See Comments)    Tears skin off.  Paper tape only please.    Patient Measurements: Height: 5' (152.4 cm) Weight: 178 lb 1.6 oz (80.786 kg) IBW/kg (Calculated) : 45.5 Heparin Dosing Weight: 66kg  Vital Signs: Temp: 97.3 F (36.3 C) (06/10 0800) Temp Source: Oral (06/10 0800) BP: 102/70 mmHg (06/10 1116) Pulse Rate: 98 (06/10 0800)  Labs:  Recent Labs  06/11/2015 2003 06/08/15 0420 06/08/2015 0539 06/06/2015 1150 July 03, 2015 0450  HGB 12.0  --   --   --   --   HCT 35.0*  --   --   --   --   PLT 143*  --   --   --   --   LABPROT  --   --   --  17.7*  --   INR  --   --   --  1.45  --   CREATININE 2.66* 2.59* 3.03*  --  3.64*  TROPONINI 0.11* 0.09*  --   --   --     Estimated Creatinine Clearance: 14.9 mL/min (by C-G formula based on Cr of 3.64).   Medical History: Past Medical History  Diagnosis Date  . Chronic systolic heart failure   . Hypertension   . Dyslipidemia   . Premature ventricular contractions (PVCs) (VPCs)   . GERD (gastroesophageal reflux disease)   . Coronary artery disease     a. DES-dRCA 11/2010. b. DES-pRCA 04/2011 (in an area free of disease on prior cath). c. s/p DES-mRCA 03/2013 for NSTEMI.  . Ischemic  cardiomyopathy     a. Hx of medtronic ICD. b. EF 15-20% by cath 03/2013.  . Pulmonary hypertension   . Gout   . Obesity   . Thrombocytopenia   . Depression   . Hypotension     a. Admission 09/2012 for hypotn & ARF. b. Meds held 03/2013 due to hypotension.  . NSTEMI (non-ST elevated myocardial infarction) 03/2013  . Anginal pain   . Asthma   . Microcytic anemia     a. Noted 03/2013 on labs, iron studies WNL.  Marland Kitchen Anxiety   . CHF (congestive heart failure)   . HTN (hypertension)   . Primary pulmonary HTN   . Heart murmur   . Bipolar disorder   . AICD (automatic cardioverter/defibrillator) present     Automatic implantable cardiac defibrillator in situ  . Pneumonia "once"  . Type II diabetes mellitus     "diet controlled" (06/18/2015)  . Migraine headache     "no pain; aura/visual problems only; at least 2-3X/month" (06/04/2015)  . Stroke 02/2010    left brain CVA? recurrent TIA's treated with TPA  . Arthritis     "knees"  (06/01/2015)  .  DJD (degenerative joint disease)   . Chronic back pain     "mid and lower back" (06/03/2015)  . Acute renal insufficiency     a. 09/2012. b. Also noted post-cath 03/2013.  . CKD (chronic kidney disease) stage 3, GFR 30-59 ml/min       Assessment: 63yof with Hx HF on milrinone PTA.  S/p RHC on milrinone and CI 1.5.  Milrinone stopped and changed to dobutamine 35mcg/kg/min.  COOX low 59, poor uop overnight despite furosemide drip /hr.   Patient taken to cath lab for IABP and started on NE.   Will start heparin drip 10units/kg/hr and titrate to goal.  CBC stable.  Goal of Therapy:  Heparin level 0.2-0.5 units/ml  Monitor platelets by anticoagulation protocol: Yes   Plan:  D/C enoxaparin  - VTE px not given yet today Heparin drip 700uts/hr HL 6hr after start  Daily HL, CBC  [name] Pharm.D. CPP, BCPS Clinical Pharmacist [phone] 06/09/2015 11:49 AM

## 2015-06-10 NOTE — Progress Notes (Signed)
ANTICOAGULATION CONSULT NOTE  Pharmacy Consult for Heparin  Indication: IABP  Allergies  Allergen Reactions  . Aspirin Shortness Of Breath and Other (See Comments)    Asthma symptoms  . Brilinta [Ticagrelor] Shortness Of Breath and Other (See Comments)    Gout   . Percocet [Oxycodone-Acetaminophen] Nausea And Vomiting    Patient can tolerate acetaminophen solely  . Darvon Nausea And Vomiting  . Diovan [Valsartan] Other (See Comments) and Nausea Only    unknown  . Imitrex [Sumatriptan Base] Other (See Comments)    Asthma symptoms, shortness of breath  . Licorice Flavor [Artificial Licorice Flavor]     Black licorice induces asthma   . Nsaids Nausea And Vomiting and Other (See Comments)    GI upset   . Tape Other (See Comments)    Tears skin off.  Paper tape only please.    Patient Measurements: Height: 5' (152.4 cm) Weight: 178 lb 1.6 oz (80.786 kg) IBW/kg (Calculated) : 45.5 Heparin Dosing Weight: 66kg  Vital Signs: Temp: 97.7 F (36.5 C) (06/10 2000) Temp Source: Axillary (06/10 2000) BP: 111/75 mmHg (06/10 2000) Pulse Rate: 97 (06/10 2000)  Labs:  Recent Labs  06/08/15 0420 06/23/2015 0539 06/07/2015 1150 06/11/2015 0450 06/09/2015 1530 06/19/2015 1938  LABPROT  --   --  17.7*  --   --   --   INR  --   --  1.45  --   --   --   HEPARINUNFRC  --   --   --   --   --  0.18*  CREATININE 2.59* 3.03*  --  3.64* 3.82*  --   TROPONINI 0.09*  --   --   --   --   --     Estimated Creatinine Clearance: 14.2 mL/min (by C-G formula based on Cr of 3.82).  Assessment: 63yof with Hx HF on milrinone PTA.  S/p RHC on milrinone and CI 1.5.  Milrinone stopped and changed to dobutamine 61mcg/kg/min.  COOX low 59, poor uop overnight despite furosemide drip 15mg /hr.   Patient taken to cath lab for IABP and started on NE.   Will start heparin drip 10units/kg/hr and titrate to goal.  CBC stable.  PM HL = 0.18  Goal of Therapy:  Heparin level 0.2-0.5 units/ml  Monitor platelets by  anticoagulation protocol: Yes   Plan:  Increase heparin to 800 units / hr Daily HL, CBC  Thank you. Okey Regal, PharmD [phone]  06/25/2015 8:11 PM

## 2015-06-10 NOTE — Interval H&P Note (Signed)
History and Physical Interval Note:  2015-06-19 10:31 AM  Andrea [name]  has presented today for surgery, with the diagnosis of hf  The various methods of treatment have been discussed with the patient and family. After consideration of risks, benefits and other options for treatment, the patient has consented to  Procedure(s): IABP Insertion/Swan (N/A) Right Heart Cath (N/A) as a surgical intervention .  The patient's history has been reviewed, patient examined, no change in status, stable for surgery.  I have reviewed the patient's chart and labs.  Questions were answered to the patient's satisfaction.     Grisela Mesch, Reuel Boom

## 2015-06-10 NOTE — Progress Notes (Signed)
Orthopedic Tech Progress Note Patient Details:  Andrea [name] [birth year] [id number] Delivered for application by nursing staff once procedure is complete Ortho Devices Type of Ortho Device: Knee Immobilizer Ortho Device/Splint Interventions: Ordered   [name] 2015/06/19, 3:54 PM

## 2015-06-10 NOTE — Progress Notes (Addendum)
UOP remains minimal only additional 20 ml of urine noted in foley bag. MD did call back at 0300, placed order for ultrasound to confirm retention as noted by bladder scanner. Patient intermittently c/o having to void.

## 2015-06-10 NOTE — H&P (View-Only) (Signed)
Advanced Heart Failure Rounding Note   Subjective:    Admitted from HF clinic with increased dyspnea and volume overload.  Yesterday she was transferred to stepdown post cath due to cardiogenic shock. Milrinone stopped and dobutamine started 5 mcg. CO-OX this morning 59%. Poor urine output over night despite lasix drip at 15 mg per hour.   Complaining of fatigue. Dyspnea at rest.   Creatinine 3.0>3.6   RHC 06/08/2015 RA = 25 RV = 56/25 PA = 62/24 (43) PCW = 25 Fick cardiac output/index = 2.6/1.5 Thermo CO/CI = 3.8/2.2 PVR = 6.9 WU FA sat = 95% PA sat = 40%, 45% SVC sat via PICC = 49%  Conclusion:  1. Decompensated HF with cardiogenic shock physiology and marked volume overload  Objective:   Weight Range:  Vital Signs:   Temp:  [97.7 F (36.5 C)-98.5 F (36.9 C)] 97.8 F (36.6 C) (06/10 0500) Pulse Rate:  [0-180] 103 (06/10 0500) Resp:  [0-38] 22 (06/10 0500) BP: (84-119)/(45-79) 104/62 mmHg (06/10 0500) SpO2:  [0 %-100 %] 98 % (06/10 0500) Weight:  [175 lb 14.8 oz (79.8 kg)-178 lb 1.6 oz (80.786 kg)] 178 lb 1.6 oz (80.786 kg) (06/10 0500) Last BM Date: 06/08/2015  Weight change: Filed Weights   06/20/2015 0541 06/10/2015 1652 06/13/2015 0500  Weight: 174 lb 4.8 oz (79.062 kg) 175 lb 14.8 oz (79.8 kg) 178 lb 1.6 oz (80.786 kg)    Intake/Output:   Intake/Output Summary (Last 24 hours) at 06/16/2015 0706 Last data filed at 06/17/2015 0500  Gross per 24 hour  Intake 623.34 ml  Output    270 ml  Net 353.34 ml     PHYSICAL EXAM: General: Lying flat in bed. weak HEENT: normal Neck: supple. JVP to jaw . Carotids 2+ bilaterally; no bruits. No lymphadenopathy or thryomegaly appreciated. Cor: PMI normal. Tachy Regular No rubs, or murmurs. +S3 Lungs: clear Abdomen: soft, tender, nondistended. No hepatosplenomegaly. No bruits or masses. Good bowel sounds. Extremities: no cyanosis, clubbing, rash, R and LLE 1-2+ edema, ted hose on R and LLE Neurro: alert & orientedx3,  cranial nerves grossly intact. Moves all 4 extremities w/o difficulty. Affect pleasant          Telemetry: Sinus Tach   Labs: Basic Metabolic Panel:  Recent Labs Lab 06/24/2015 2003 06/08/15 0420 06/13/2015 0539 06/20/2015 0450  NA 135 136 136 135  K 3.8 4.1 4.9 5.2*  CL 102 101 102 101  CO2 20* 22 22 19*  GLUCOSE 119* 111* 91 124*  BUN 71* 71* 77* 75*  CREATININE 2.66* 2.59* 3.03* 3.64*  CALCIUM 9.1 9.4 9.3 9.4  MG 2.1  --   --   --     Liver Function Tests:  Recent Labs Lab 06/15/2015 2003  AST 18  ALT 9*  ALKPHOS 46  BILITOT 1.3*  PROT 7.4  ALBUMIN 3.3*   No results for input(s): LIPASE, AMYLASE in the last 168 hours. No results for input(s): AMMONIA in the last 168 hours.  CBC:  Recent Labs Lab 06/28/2015 2003  WBC 4.6  NEUTROABS 2.9  HGB 12.0  HCT 35.0*  MCV 64.9*  PLT 143*    Cardiac Enzymes:  Recent Labs Lab 06/03/2015 2003 06/08/15 0420  TROPONINI 0.11* 0.09*    BNP: BNP (last 3 results)  Recent Labs  03/15/15 2150 03/22/15 1601 06/29/2015 1800  BNP 701.4* 930.9* 1217.7*    ProBNP (last 3 results)  Recent Labs  11/08/14 1800  PROBNP 2681.0*      Other   results:  Imaging: Dg Chest 2 View  06/08/2015   CLINICAL DATA:  Vomiting this morning, weakness, chronic kidney disease, asthma, congestive are  EXAM: CHEST  2 VIEW  COMPARISON:  03/24/2015  FINDINGS: Stable significant enlarged cardiac silhouette. Severe vascular congestion. Opacity right lower lobe suggests a combination of pleural effusion and consolidation, stable. Persistent bilateral perihilar hazy opacity. Support devices unchanged.  IMPRESSION: Congestive heart failure unchanged.   Electronically Signed   By: Raymond  Rubner M.D.   On: 06/08/2015 08:07   Dg Abd Portable 1v  06/08/2015   CLINICAL DATA:  Abdominal pain and distension  EXAM: PORTABLE ABDOMEN - 1 VIEW  COMPARISON:  03/16/2015  FINDINGS: Scattered large and small bowel gas is noted. No abnormal mass or abnormal  calcifications are seen. Phleboliths are noted in the pelvis in to the right of the midline. Significant degenerative change of the lumbar spine is again seen. No free air is noted.  IMPRESSION: No acute abnormality noted.   Electronically Signed   By: Mark  Lukens M.D.   On: 06/08/2015 10:38     Medications:     Scheduled Medications: . calcitRIOL  0.25 mcg Oral Daily  . DULoxetine  60 mg Oral Daily  . enoxaparin (LOVENOX) injection  30 mg Subcutaneous Q24H  . ferrous sulfate  325 mg Oral TID WC  . gabapentin  200 mg Oral QHS  . hydrALAZINE  25 mg Oral 3 times per day  . magnesium oxide  400 mg Oral TID  . pantoprazole  40 mg Oral Daily  . rOPINIRole  2 mg Oral QHS  . sodium chloride  3 mL Intravenous Q12H  . zolpidem  5 mg Oral QHS    Infusions: . DOBUTamine 5 mcg/kg/min (06/08/2015 1655)  . furosemide (LASIX) infusion 15 mg/hr (06/05/2015 1723)    PRN Medications: sodium chloride, acetaminophen, nitroGLYCERIN, ondansetron (ZOFRAN) IV, ondansetron, polyethylene glycol, sodium chloride, sodium chloride, traMADol-acetaminophen   Assessment/Plan    1. Acute Chronic Systolic HF, ICM s/p ICD; EF ~ 30-35% on milrinone 0.375 mcg (07/2014). Asking about transplant.  RHC yesterday showed cardiogenic and volume overload. Milrinone stopped and she switched to dobutamine at 5 mcg. CO-OX 59%. Continue lasix drip 15mg per hour. Worsening renal function today. Poor urine output noted. No urine with bladder scan and ultrasound. Set up CVP. She would like to consider advanced therapies.  2. CAD S/P PCI with DES to prox RCA and stent to distal RCA in 2012. No s/s of ischemia.  3. PSVT - quiescent. continue ami4. Abdominal Pain- KUB. Ok. o 200 daily.   4. Acute on chronic renal failure, stage IV  Length of Stay: 3 CLEGG,AMY NP-C  06/08/2015, 7:06 AM Advanced Heart Failure Team Pager [phone] (M-F; 7a - 4p)  Please contact [facility] for night-coverage after hours (4p -7a ) and weekends on  [email or web address]  Patient seen and examined with Amy Clegg, NP. We discussed all aspects of the encounter. I agree with the assessment and plan as stated above.   Co-ox slightly better in switching from milrinone to dobutamine but urine output poor and renal functioning worsening. She seems to have progressive shock. I had long talk with her and her partner about options. At this point, she is failing inotropic support and only options would be 1) to proceed with IABP and Swan placement with eye toward VAD if her renal function improves or 2) Palliative care. They understand that renal function would have to get significantly better and that she would   have to be approved for VAD to proceed to that level. They want to discuss a bit more and will let us know. Will continue dobutamine for now with lasix gtt.   .The patient is critically ill with multiple organ systems failure and requires high complexity decision making for assessment and support, frequent evaluation and titration of therapies, application of advanced monitoring technologies and extensive interpretation of multiple databases.   Critical Care Time devoted to patient care services described in this note is 45 Minutes.  Daphney Hopke,MD 8:00 AM    

## 2015-06-10 NOTE — Progress Notes (Signed)
  Echocardiogram 2D Echocardiogram has been performed.  Delcie Roch 06/02/2015, 4:42 PM

## 2015-06-10 NOTE — Care Management Note (Signed)
Case Management Note  Patient Details  Name: NABIL MOULIN MRN: [id number] Date of Birth: 28-Feb-1951  Subjective/Objective:       Adm w heart failure, in cath lab getting iabp             Action/Plan: lives w fam, pcp dr Molly Maduro reade   Expected Discharge Date:                  Expected Discharge Plan:  Home w Home Health Services  In-House Referral:     Discharge planning Services     Post Acute Care Choice:    Choice offered to:     DME Arranged:    DME Agency:     HH Arranged:    HH Agency:     Status of Service:     Medicare Important Message Given:    Date Medicare IM Given:    Medicare IM give by:    Date Additional Medicare IM Given:    Additional Medicare Important Message give by:     If discussed at Long Length of Stay Meetings, dates discussed:    Additional Comments: may need hhrn at disch. Will follow for dc needs. Ur review done.  Hanley Hays, RN 06/01/2015, 11:36 AM

## 2015-06-10 NOTE — Progress Notes (Signed)
Bladder scanner showing again large amount of urine between 450-550 ml. Patient attempted to use BSC w/o any success. 37F foley catheter inserted w/o difficulties with return of only 40 ml of urine. MD paged.

## 2015-06-10 NOTE — Progress Notes (Signed)
Day of Surgery Procedure(s) (LRB): IABP Insertion/Swan (N/A) Right Heart Cath (N/A) Subjective: The patient examined and situation reviewed. Progressive decline in hemodynamics despite constant milrinone infusion at home now changed to dobutamine. Intra-aortic balloon pump placed within the past 24 hours because of acute on chronic renal failure. Creatinine has been in the 3.0-4.0 range since March of this year. She has significant fluid overload with CVP of 25.  Last 2-D echocardiogram performed in 2015 personally reviewed showing LV EF measured at 30%. With recurrent hemodynamics and high filling pressures I doubt her EF is  Now more than 20%. The RV function on the previous echocardiogram shows probable adequate function for advance mechanical support however the current RV function is probably worse-repeat echocardiogram has been ordered Objective: Vital signs in last 24 hours: Temp:  [96.6 F (35.9 C)-98.5 F (36.9 C)] 96.6 F (35.9 C) (06/10 1400) Pulse Rate:  [92-295] 100 (06/10 1209) Cardiac Rhythm:  [-] Normal sinus rhythm (06/10 0730) Resp:  [0-47] 28 (06/10 1400) BP: (77-116)/(45-80) 110/69 mmHg (06/10 1400) SpO2:  [0 %-100 %] 96 % (06/10 1400) Weight:  [175 lb 14.8 oz (79.8 kg)-178 lb 1.6 oz (80.786 kg)] 178 lb 1.6 oz (80.786 kg) (06/10 0500)  Hemodynamic parameters for last 24 hours: PAP: (57-63)/(32-36) 63/33 mmHg CVP:  [25 mmHg-31 mmHg] 25 mmHg  Intake/Output from previous day: 06/09 0701 - 06/10 0700 In: 665.1 [P.O.:360; I.V.:305.1] Out: 270 [Urine:270] Intake/Output this shift: Total I/O In: 190.9 [I.V.:190.9] Out: 72 [Urine:72]  Exam  Patient anxious and somewhat tearful surrounded by family and friends Significant peripheral edema Positive S3 heart sound, increased heart rate Balloon pump in place, lower extremities warm  Lab Results:  Recent Labs  06/26/2015 2003  WBC 4.6  HGB 12.0  HCT 35.0*  PLT 143*   BMET:  Recent Labs  06/23/2015 0539  06/02/2015 0450  NA 136 135  K 4.9 5.2*  CL 102 101  CO2 22 19*  GLUCOSE 91 124*  BUN 77* 75*  CREATININE 3.03* 3.64*  CALCIUM 9.3 9.4    PT/INR:  Recent Labs  06/23/2015 1150  LABPROT 17.7*  INR 1.45   ABG    Component Value Date/Time   PHART 7.369 04/12/2014 0811   HCO3 19.7* 06/15/2015 1430   TCO2 21 06/25/2015 1430   ACIDBASEDEF 5.0* 06/29/2015 1430   O2SAT 59.6 06/25/2015 0450   CBG (last 3)   Recent Labs  06/28/2015 0611 06/08/2015 1132 06/17/2015 1526  GLUCAP 98 103* 95    Assessment/Plan: S/P Procedure(s) (LRB): IABP Insertion/Swan (N/A) Right Heart Cath (N/A) With acute on chronic renal failure and probable moderate to severe RV dysfunction she does not appear to be a candidate for implantable LVAD. Hopefully with further balloon pump support these issues will improve. Will follow.   LOS: 3 days    Andrea [name] 06/15/2015

## 2015-06-11 DIAGNOSIS — R570 Cardiogenic shock: Secondary | ICD-10-CM

## 2015-06-11 LAB — BASIC METABOLIC PANEL
Anion gap: 11 (ref 5–15)
BUN: 88 mg/dL — ABNORMAL HIGH (ref 6–20)
CO2: 20 mmol/L — ABNORMAL LOW (ref 22–32)
Calcium: 8.9 mg/dL (ref 8.9–10.3)
Chloride: 102 mmol/L (ref 101–111)
Creatinine, Ser: 4.07 mg/dL — ABNORMAL HIGH (ref 0.44–1.00)
GFR calc Af Amer: 12 mL/min — ABNORMAL LOW (ref >60)
GFR calc non Af Amer: 11 mL/min — ABNORMAL LOW (ref >60)
Glucose, Bld: 137 mg/dL — ABNORMAL HIGH (ref 65–99)
POTASSIUM: 5.4 mmol/L — AB (ref 3.5–5.1)
SODIUM: 133 mmol/L — AB (ref 135–145)

## 2015-06-11 LAB — RENAL FUNCTION PANEL
Albumin: 3 g/dL — ABNORMAL LOW (ref 3.5–5.0)
Anion gap: 14 (ref 5–15)
BUN: 75 mg/dL — ABNORMAL HIGH (ref 6–20)
CHLORIDE: 96 mmol/L — AB (ref 101–111)
CO2: 19 mmol/L — ABNORMAL LOW (ref 22–32)
CREATININE: 3.7 mg/dL — AB (ref 0.44–1.00)
Calcium: 8.2 mg/dL — ABNORMAL LOW (ref 8.9–10.3)
GFR calc Af Amer: 14 mL/min — ABNORMAL LOW (ref >60)
GFR calc non Af Amer: 12 mL/min — ABNORMAL LOW (ref >60)
GLUCOSE: 129 mg/dL — AB (ref 65–99)
PHOSPHORUS: 5.4 mg/dL — AB (ref 2.5–4.6)
Potassium: 4.6 mmol/L (ref 3.5–5.1)
Sodium: 129 mmol/L — ABNORMAL LOW (ref 135–145)

## 2015-06-11 LAB — URINE CULTURE
COLONY COUNT: NO GROWTH
Culture: NO GROWTH

## 2015-06-11 LAB — CBC
HCT: 34.9 % — ABNORMAL LOW (ref 36.0–46.0)
Hemoglobin: 11.9 g/dL — ABNORMAL LOW (ref 12.0–15.0)
MCH: 21.8 pg — ABNORMAL LOW (ref 26.0–34.0)
MCHC: 34.1 g/dL (ref 30.0–36.0)
MCV: 63.9 fL — ABNORMAL LOW (ref 78.0–100.0)
Platelets: 129 10*3/uL — ABNORMAL LOW (ref 150–400)
RBC: 5.46 MIL/uL — ABNORMAL HIGH (ref 3.87–5.11)
RDW: 20.6 % — ABNORMAL HIGH (ref 11.5–15.5)
WBC: 5 10*3/uL (ref 4.0–10.5)

## 2015-06-11 LAB — CARBOXYHEMOGLOBIN
Carboxyhemoglobin: 0.9 % (ref 0.5–1.5)
Methemoglobin: 1 % (ref 0.0–1.5)
O2 Saturation: 65.5 %
Total hemoglobin: 12.3 g/dL (ref 12.0–16.0)

## 2015-06-11 LAB — HEPARIN LEVEL (UNFRACTIONATED)
HEPARIN UNFRACTIONATED: 0.12 [IU]/mL — AB (ref 0.30–0.70)
HEPARIN UNFRACTIONATED: 0.23 [IU]/mL — AB (ref 0.30–0.70)
Heparin Unfractionated: 0.16 IU/mL — ABNORMAL LOW (ref 0.30–0.70)

## 2015-06-11 LAB — MAGNESIUM: Magnesium: 2.4 mg/dL (ref 1.7–2.4)

## 2015-06-11 MED ORDER — PRISMASOL BGK 4/2.5 32-4-2.5 MEQ/L IV SOLN
INTRAVENOUS | Status: DC
  Administered 2015-06-11 – 2015-06-18 (×44): via INTRAVENOUS_CENTRAL
  Filled 2015-06-11 (×64): qty 5000

## 2015-06-11 MED ORDER — HEPARIN (PORCINE) 2000 UNITS/L FOR CRRT
INTRAVENOUS_CENTRAL | Status: DC | PRN
  Administered 2015-06-14: 21:00:00 via INTRAVENOUS_CENTRAL
  Filled 2015-06-11 (×2): qty 1000

## 2015-06-11 MED ORDER — METOLAZONE 5 MG PO TABS
5.0000 mg | ORAL_TABLET | Freq: Once | ORAL | Status: AC
  Administered 2015-06-11: 5 mg via ORAL
  Filled 2015-06-11: qty 1

## 2015-06-11 MED ORDER — SODIUM POLYSTYRENE SULFONATE 15 GM/60ML PO SUSP
30.0000 g | Freq: Once | ORAL | Status: AC
  Administered 2015-06-11: 30 g via ORAL
  Filled 2015-06-11: qty 120

## 2015-06-11 MED ORDER — FERROUS SULFATE 325 (65 FE) MG PO TABS
325.0000 mg | ORAL_TABLET | Freq: Three times a day (TID) | ORAL | Status: DC
  Administered 2015-06-12 (×3): 325 mg via ORAL
  Filled 2015-06-11 (×4): qty 1

## 2015-06-11 MED ORDER — HEPARIN SODIUM (PORCINE) 1000 UNIT/ML DIALYSIS
1000.0000 [IU] | INTRAMUSCULAR | Status: DC | PRN
  Administered 2015-06-11 (×3): 1400 [IU] via INTRAVENOUS_CENTRAL
  Administered 2015-06-18: 2800 [IU] via INTRAVENOUS_CENTRAL
  Filled 2015-06-11 (×2): qty 6
  Filled 2015-06-11: qty 3
  Filled 2015-06-11 (×3): qty 6

## 2015-06-11 MED ORDER — PRISMASOL BGK 4/2.5 32-4-2.5 MEQ/L IV SOLN
INTRAVENOUS | Status: DC
  Administered 2015-06-11 – 2015-06-18 (×6): via INTRAVENOUS_CENTRAL
  Filled 2015-06-11 (×9): qty 5000

## 2015-06-11 MED ORDER — LIDOCAINE HCL (PF) 1 % IJ SOLN
INTRAMUSCULAR | Status: AC
  Administered 2015-06-11: 5 mL
  Filled 2015-06-11: qty 5

## 2015-06-11 MED ORDER — PRISMASOL BGK 4/2.5 32-4-2.5 MEQ/L IV SOLN
INTRAVENOUS | Status: DC
  Administered 2015-06-11 – 2015-06-18 (×8): via INTRAVENOUS_CENTRAL
  Filled 2015-06-11 (×12): qty 5000

## 2015-06-11 NOTE — Progress Notes (Signed)
ANTICOAGULATION CONSULT NOTE - Follow Up Consult  Pharmacy Consult for Heparin  Indication: IABP  Allergies  Allergen Reactions  . Aspirin Shortness Of Breath and Other (See Comments)    Asthma symptoms  . Brilinta [Ticagrelor] Shortness Of Breath and Other (See Comments)    Gout   . Percocet [Oxycodone-Acetaminophen] Nausea And Vomiting    Patient can tolerate acetaminophen solely  . Darvon Nausea And Vomiting  . Diovan [Valsartan] Other (See Comments) and Nausea Only    unknown  . Imitrex [Sumatriptan Base] Other (See Comments)    Asthma symptoms, shortness of breath  . Licorice Flavor [Artificial Licorice Flavor]     Black licorice induces asthma   . Nsaids Nausea And Vomiting and Other (See Comments)    GI upset   . Tape Other (See Comments)    Tears skin off.  Paper tape only please.    Patient Measurements: Height: 5' (152.4 cm) Weight: 181 lb 10.5 oz (82.4 kg) IBW/kg (Calculated) : 45.5  Vital Signs: Temp: 95.4 F (35.2 C) (06/11 2200) Temp Source: Core (Comment) (06/11 2200) BP: 123/87 mmHg (06/11 2200) Pulse Rate: 100 (06/11 2000)  Labs:  Recent Labs  06/20/2015 1150  07-03-2015 1530  06/11/15 0420 06/11/15 1251 06/11/15 1800 06/11/15 2130  HGB  --   --   --   --  11.9*  --   --   --   HCT  --   --   --   --  34.9*  --   --   --   PLT  --   --   --   --  129*  --   --   --   LABPROT 17.7*  --   --   --   --   --   --   --   INR 1.45  --   --   --   --   --   --   --   HEPARINUNFRC  --   --   --   < > 0.12* 0.23*  --  0.16*  CREATININE  --   < > 3.82*  --  4.07*  --  3.70*  --   < > = values in this interval not displayed.  Estimated Creatinine Clearance: 14.8 mL/min (by C-G formula based on Cr of 3.7).   Assessment: Sub-therapeutic heparin level at 0.16. Patient with IABP now on CRRT.  Goal of Therapy:  Heparin level 0.2-0.5 units/ml Monitor platelets by anticoagulation protocol: Yes   Plan:  -Increase heparin drip to 1000 units/hr -Daily  CBC/HL (with AM labs to confirm) -Monitor for bleeding  Isaac Bliss, PharmD, BCPS Clinical Pharmacist Pager [phone] 06/11/2015 10:24 PM

## 2015-06-11 NOTE — Procedures (Signed)
Trialysis Catheter Insertion Procedure Note Andrea [name] [id number] [birth year]  Procedure: Insertion of Trialysis Central Venous Catheter Indications: CVVHD  Procedure Details Consent: Risks of procedure as well as the alternatives and risks of each were explained to the (patient/caregiver).  Consent for procedure obtained. Time Out: Verified patient identification, verified procedure, site/side was marked, verified correct patient position, special equipment/implants available, medications/allergies/relevent history reviewed, required imaging and test results available.  Performed  Maximum sterile technique was used including antiseptics, cap, gloves, gown, hand hygiene, mask and sheet. Skin prep: Chlorhexidine; local anesthetic administered A antimicrobial bonded/coated triple lumen catheter was placed in the right femoral vein using the Seldinger technique. (IJ access sites already occupied)  Evaluation Blood flow good Complications: No apparent complications Patient did tolerate procedure well.   Andrea Meres MD 06/11/2015, 3:35 PM

## 2015-06-11 NOTE — Consult Note (Signed)
Andrea [name] is an 64 y.o. female referred by Dr Gala Romney   Chief Complaint: Acute on CKD HPI: 63yo BF admitted 06/12/2015 for worsening heart failure in face of severe cardiomyopathy and EF 15%.  Had Nl Scr in 3/16 and developed contrast nephropathy at that time with Scr increasing to 4.7 and decreased to 2.64 at DC.  On 05/16/15 scr 2.8, 05/31/15 2.9.  On Admission Scr 2.6 and has increased to 4.  UO has decreased to just 274cc yest.  She is on levophed and milrinone gtts with intra aortic balloon pump.  Past Medical History  Diagnosis Date  . Chronic systolic heart failure   . Hypertension   . Dyslipidemia   . Premature ventricular contractions (PVCs) (VPCs)   . GERD (gastroesophageal reflux disease)   . Coronary artery disease     a. DES-dRCA 11/2010. b. DES-pRCA 04/2011 (in an area free of disease on prior cath). c. s/p DES-mRCA 03/2013 for NSTEMI.  . Ischemic cardiomyopathy     a. Hx of medtronic ICD. b. EF 15-20% by cath 03/2013.  . Pulmonary hypertension   . Gout   . Obesity   . Thrombocytopenia   . Depression   . Hypotension     a. Admission 09/2012 for hypotn & ARF. b. Meds held 03/2013 due to hypotension.  . NSTEMI (non-ST elevated myocardial infarction) 03/2013  . Anginal pain   . Asthma   . Microcytic anemia     a. Noted 03/2013 on labs, iron studies WNL.  Marland Kitchen Anxiety   . CHF (congestive heart failure)   . HTN (hypertension)   . Primary pulmonary HTN   . Heart murmur   . Bipolar disorder   . AICD (automatic cardioverter/defibrillator) present     Automatic implantable cardiac defibrillator in situ  . Pneumonia "once"  . Type II diabetes mellitus     "diet controlled" (06/09/2015)  . Migraine headache     "no pain; aura/visual problems only; at least 2-3X/month" (06/12/2015)  . Stroke 02/2010    left brain CVA? recurrent TIA's treated with TPA  . Arthritis     "knees"  (06/20/2015)  . DJD (degenerative joint disease)   . Chronic back pain     "mid and lower back"  (06/28/2015)  . Acute renal insufficiency     a. 09/2012. b. Also noted post-cath 03/2013.  . CKD (chronic kidney disease) stage 3, GFR 30-59 ml/min     Past Surgical History  Procedure Laterality Date  . Mass excision Left     hip  . Cervical polypectomy  1970's  . US echocardiography  2011  . Tonsillectomy  1960's?  . Right oophorectomy  1980's  . Dilation and curettage of uterus  1970's  . Ostectomy metatarsal Left 1993    "5th toe; from rickets" (05/07/2013)  . Implantable cardioverter defibrillator implant  ~ 2008  . Coronary angioplasty with stent placement  2000's    "2 stents" (05/07/2013)  . Coronary angioplasty with stent placement  03/2013    PCI to RCA/notes 05/05/2013; "makes total of 3" (05/07/2013)  . Left heart catheterization with coronary angiogram N/A 04/27/2013    Procedure: LEFT HEART CATHETERIZATION WITH CORONARY ANGIOGRAM;  Surgeon: Kathleene Hazel, MD;  Location: [facility] CATH LAB;  Service: Cardiovascular;  Laterality: N/A;  . Percutaneous coronary stent intervention (pci-s)  04/27/2013    Procedure: PERCUTANEOUS CORONARY STENT INTERVENTION (PCI-S);  Surgeon: Kathleene Hazel, MD;  Location: [facility] CATH LAB;  Service: Cardiovascular;;  Mid RCA   .  Left and right heart catheterization with coronary angiogram N/A 04/12/2014    Procedure: LEFT AND RIGHT HEART CATHETERIZATION WITH CORONARY ANGIOGRAM;  Surgeon: Dolores Patty, MD;  Location: [facility] CATH LAB;  Service: Cardiovascular;  Laterality: N/A;  . Cardiac catheterization N/A July 04, 2015    Procedure: Right Heart Cath;  Surgeon: Dolores Patty, MD;  Location: [facility] INVASIVE CV LAB;  Service: Cardiovascular;  Laterality: N/A;  . Cardiac catheterization N/A 06/05/2015    Procedure: IABP Insertion/Swan;  Surgeon: Dolores Patty, MD;  Location: [facility] INVASIVE CV LAB;  Service: Cardiovascular;  Laterality: N/A;  . Cardiac catheterization N/A 06/13/2015    Procedure: Right Heart Cath;  Surgeon: Dolores Patty, MD;   Location: [facility] INVASIVE CV LAB;  Service: Cardiovascular;  Laterality: N/A;    Family History  Problem Relation Age of Onset  . Heart failure Mother   . Heart attack Father   . Heart disease Brother   FH neg for renal ds  Social History:  reports that she quit smoking about 33 years ago. Her smoking use included Cigarettes. She has a .25 pack-year smoking history. She has never used smokeless tobacco. She reports that she does not drink alcohol or use illicit drugs.  Allergies:  Allergies  Allergen Reactions  . Aspirin Shortness Of Breath and Other (See Comments)    Asthma symptoms  . Brilinta [Ticagrelor] Shortness Of Breath and Other (See Comments)    Gout   . Percocet [Oxycodone-Acetaminophen] Nausea And Vomiting    Patient can tolerate acetaminophen solely  . Darvon Nausea And Vomiting  . Diovan [Valsartan] Other (See Comments) and Nausea Only    unknown  . Imitrex [Sumatriptan Base] Other (See Comments)    Asthma symptoms, shortness of breath  . Licorice Flavor [Artificial Licorice Flavor]     Black licorice induces asthma   . Nsaids Nausea And Vomiting and Other (See Comments)    GI upset   . Tape Other (See Comments)    Tears skin off.  Paper tape only please.    Medications Prior to Admission  Medication Sig Dispense Refill  . calcitRIOL (ROCALTROL) 0.25 MCG capsule Take 1 capsule by mouth daily.    . DULoxetine (CYMBALTA) 60 MG capsule Take 60 mg by mouth daily.    . ferrous sulfate 325 (65 FE) MG tablet Take 1 tablet (325 mg total) by mouth 3 (three) times daily with meals. (Patient taking differently: Take 325 mg by mouth 2 (two) times daily. ) 90 tablet 0  . furosemide (LASIX) 40 MG tablet Take 1 tablet (40 mg total) by mouth daily. 30 tablet 6  . gabapentin (NEURONTIN) 100 MG capsule Take 100 mg by mouth 2 (two) times daily.     . hydrALAZINE (APRESOLINE) 25 MG tablet Take 1 tablet (25 mg total) by mouth 3 (three) times daily. 90 tablet 0  . magnesium oxide  (MAG-OX) 400 (241.3 MG) MG tablet Take 1 tablet (400 mg total) by mouth 3 (three) times daily. 90 tablet 6  . milrinone (PRIMACOR) 20 MG/100ML SOLN infusion Inject 30.225 mcg/min into the vein continuous. 100 mL   . pantoprazole (PROTONIX) 40 MG tablet Take 1 tablet by mouth daily.    . ranitidine (ZANTAC) 300 MG tablet Take 1 tablet by mouth daily.    Marland Kitchen rOPINIRole (REQUIP) 2 MG tablet Take 0.5 tablets by mouth at bedtime.    . traMADol-acetaminophen (ULTRACET) 37.5-325 MG per tablet Take 1 tablet by mouth every 6 (six) hours as needed. 20 tablet  0  . zolpidem (AMBIEN) 5 MG tablet Take 1 tablet by mouth at bedtime.    Marland Kitchen acetaminophen (TYLENOL) 500 MG tablet Take 500 mg by mouth every 6 (six) hours as needed for mild pain.    Marland Kitchen diclofenac sodium (VOLTAREN) 1 % GEL Apply 2 g topically 4 (four) times daily as needed (pain).     . nitroGLYCERIN (NITROSTAT) 0.4 MG SL tablet Place 1 tablet (0.4 mg total) under the tongue every 5 (five) minutes as needed for chest pain (MAX 3 TABLETS IN 15 MINUTES). 25 tablet 3  . polyethylene glycol (MIRALAX / GLYCOLAX) packet Take 17 g by mouth daily as needed for mild constipation.       Lab Results: UA: 100mg  prot   Recent Labs  06/11/15 0420  WBC 5.0  HGB 11.9*  HCT 34.9*  PLT 129*   BMET  Recent Labs  06-11-2015 0450 2015-06-11 1530 06/11/15 0420  NA 135 133* 133*  K 5.2* 5.3* 5.4*  CL 101 100* 102  CO2 19* 19* 20*  GLUCOSE 124* 172* 137*  BUN 75* 84* 88*  CREATININE 3.64* 3.82* 4.07*  CALCIUM 9.4 9.1 8.9   LFT No results for input(s): PROT, ALBUMIN, AST, ALT, ALKPHOS, BILITOT, BILIDIR, IBILI in the last 72 hours. US Abdomen Limited  June 11, 2015   CLINICAL DATA:  64 year old female in whom evaluation for bladder distension and ascites is requested. Initial encounter.  EXAM: LIMITED ABDOMEN ULTRASOUND FOR ASCITES  TECHNIQUE: Limited ultrasound survey for ascites was performed in all four abdominal quadrants.  COMPARISON:  CT Abdomen and Pelvis  03/16/2015  FINDINGS: Ascites is detected within the abdomen. There is free fluid in the pelvis and in the right upper quadrant (image 4, image 10). Trace if any left upper quadrant fluid. Small volume of ascites overall.  Within the pelvis the bladder is decompressed and a Foley catheter balloon is visible (image 5).  IMPRESSION: 1. Positive for ascites. But small volume of fluid overall, may be quantity insufficient for paracentesis. 2. Bladder appears decompressed, a Foley catheter balloon evident within the lumen.   Electronically Signed   By: Odessa Fleming M.D.   On: 11-Jun-2015 07:29   Dg Chest Port 1 View  06-11-2015   CLINICAL DATA:  Shortness of breath today.  EXAM: PORTABLE CHEST - 1 VIEW  COMPARISON:  06/08/2015  FINDINGS: There has been interval placement of a right jugular Swan-Ganz catheter with tip projecting in the region of the right interlobar artery. Pre-existing right jugular central venous catheter remains in place, terminating over the SVC. Single lead AICD remains in place. Cardiac silhouette remains enlarged. There is improved aeration of the right lung base, although right lower lobe opacity remains. Veiling opacity persists in the right hemi thorax, compatible with a pleural effusion. No definite left pleural effusion or pneumothorax is identified. Pulmonary vascular congestion has decreased.  IMPRESSION: 1. New Swan-Ganz catheter as above. 2. Improved pulmonary vascular congestion and improved aeration of the right lung base. Persistent right pleural effusion.   Electronically Signed   By: Sebastian Ache   On: 06/11/2015 13:38    ROS: + weakness + CP + abd pain No Jt pain appetiet poor No change in vsion  PHYSICAL EXAM: Blood pressure 115/67, pulse 95, temperature 99 F (37.2 C), temperature source Core (Comment), resp. rate 19, height 5' (1.524 m), weight 82.4 kg (181 lb 10.5 oz), SpO2 100 %. HEENT: PERRLA EOMI NECK:+ JVD LUNGS:Bilat crackles CARDIAC:RRR 2/6 systolic M ABD: +  BS  with mild diffuse tenderness and mild rebound EXT:Lt leg in splint to prevent bending with IABP.  1+ edema on Rt NEURO: CNI Ox3 no asterixis  Assessment: 1. Acute on CKD secondary to cardiorenal syndrome in face of severe cardiomyopathy and EF 15% 2. Mild hyperkalemia 3. Severe cardiomyopathy PLAN: 1. Discussed with Dr Gala Romney that he will place HD cath for CVVHD.  Overall her prognosis is poor and not sure that CVVHD is going to change the ultimate outcome and may just be putting her through more procedures and delaying the inevitable.  If pt and Dr Gala Romney wish to proceed then will order CVVHD   [place] Boyett T 06/11/2015, 1:22 PM

## 2015-06-11 NOTE — Progress Notes (Addendum)
ANTICOAGULATION CONSULT NOTE - Follow Up Consult  Pharmacy Consult for Heparin  Indication: IABP  Allergies  Allergen Reactions  . Aspirin Shortness Of Breath and Other (See Comments)    Asthma symptoms  . Brilinta [Ticagrelor] Shortness Of Breath and Other (See Comments)    Gout   . Percocet [Oxycodone-Acetaminophen] Nausea And Vomiting    Patient can tolerate acetaminophen solely  . Darvon Nausea And Vomiting  . Diovan [Valsartan] Other (See Comments) and Nausea Only    unknown  . Imitrex [Sumatriptan Base] Other (See Comments)    Asthma symptoms, shortness of breath  . Licorice Flavor [Artificial Licorice Flavor]     Black licorice induces asthma   . Nsaids Nausea And Vomiting and Other (See Comments)    GI upset   . Tape Other (See Comments)    Tears skin off.  Paper tape only please.    Patient Measurements: Height: 5' (152.4 cm) Weight: 181 lb 10.5 oz (82.4 kg) IBW/kg (Calculated) : 45.5  Vital Signs: Temp: 98.4 F (36.9 C) (06/11 0500) Temp Source: Core (Comment) (06/11 0500) BP: 113/70 mmHg (06/11 0500) Pulse Rate: 94 (06/11 0500)  Labs:  Recent Labs  06/20/2015 1150 06/04/2015 0450 06/19/2015 1530 06/14/2015 1938 06/11/15 0420  LABPROT 17.7*  --   --   --   --   INR 1.45  --   --   --   --   HEPARINUNFRC  --   --   --  0.18* 0.12*  CREATININE  --  3.64* 3.82*  --  4.07*    Estimated Creatinine Clearance: 13.5 mL/min (by C-G formula based on Cr of 4.07).   Assessment: Sub-therapeutic heparin level, note lower goal with IABP in place, no issues per RN.   Goal of Therapy:  Heparin level 0.2-0.5 units/ml Monitor platelets by anticoagulation protocol: Yes   Plan:  -Increase heparin drip to 900 units/hr -1200 HL -Daily CBC/HL -Monitor for bleeding  Abran Duke 06/11/2015,5:17 AM  ___________________________________________ ADDENDUM  HL in therapeutic range at 0.26. No issues with the line per RN. No bleeding noted.   Plan: Continue Heparin  at 900 units/hr Confirmatory HL at 2130 Daily HL/CBC Monitor s/sx of bleeding  Thank you for allowing pharmacy to be part of this patient's care team  Wesly Whisenant M. Hanna Ra, Pharm.D Clinical Pharmacy Resident Pager: [phone] 06/11/2015 .1:35 PM

## 2015-06-11 NOTE — Progress Notes (Signed)
Advanced Heart Failure Rounding Note   Subjective:    Admitted from HF clinic with increased dyspnea and volume overload.  On 6/10 underwent swan and IABP placement for persistent cardiogenic shock with co-ox in 40s and worsening renal failure. Now on dobutamine 5 and levophed 5. Co-ox 66% Very fatigued. Denies dyspnea. Urine output about 25/hr on lasix gtt at 20/hr.  Swan numbers: CVP 21 PA 54/27 (35) PCWP 27 Thermo 2.9/1.7  Echo reviewed personally LVEF 10% RV moderately HK. Severe MR/TR  Objective:   Weight Range:  Vital Signs:   Temp:  [96.6 F (35.9 C)-99 F (37.2 C)] 99 F (37.2 C) (06/11 1000) Pulse Rate:  [92-295] 95 (06/11 0800) Resp:  [0-47] 26 (06/11 1000) BP: (77-137)/(45-89) 115/58 mmHg (06/11 1000) SpO2:  [0 %-100 %] 96 % (06/11 1000) Weight:  [82.4 kg (181 lb 10.5 oz)] 82.4 kg (181 lb 10.5 oz) (06/11 0437) Last BM Date: 06/08/2015  Weight change: Filed Weights   06/02/2015 1652 06/14/2015 0500 06/11/15 0437  Weight: 79.8 kg (175 lb 14.8 oz) 80.786 kg (178 lb 1.6 oz) 82.4 kg (181 lb 10.5 oz)    Intake/Output:   Intake/Output Summary (Last 24 hours) at 06/11/15 1040 Last data filed at 06/11/15 1000  Gross per 24 hour  Intake 1653.3 ml  Output    294 ml  Net 1359.3 ml     PHYSICAL EXAM: General: Lying flat in bed. weak HEENT: normal Neck: supple. JVP to jaw . RIJ swan Carotids 2+ bilaterally; no bruits. No lymphadenopathy or thryomegaly appreciated. Cor: PMI normal. Regular No rubs,2/6 MR/TR +S3 Lungs: clear Abdomen: soft, tender, nondistended. No hepatosplenomegaly. No bruits or masses. Good bowel sounds. Extremities: no cyanosis, clubbing, rash, R and LLE 1+ edema,IABP in left groin Neurro: alert & orientedx3, cranial nerves grossly intact. Moves all 4 extremities w/o difficulty. Affect pleasant          Telemetry: Sinus   Labs: Basic Metabolic Panel:  Recent Labs Lab 06-25-15 2003 06/08/15 0420 06/14/2015 0539 06/14/2015 0450  06/18/2015 1530 06/11/15 0420  NA 135 136 136 135 133* 133*  K 3.8 4.1 4.9 5.2* 5.3* 5.4*  CL 102 101 102 101 100* 102  CO2 20* 22 22 19* 19* 20*  GLUCOSE 119* 111* 91 124* 172* 137*  BUN 71* 71* 77* 75* 84* 88*  CREATININE 2.66* 2.59* 3.03* 3.64* 3.82* 4.07*  CALCIUM 9.1 9.4 9.3 9.4 9.1 8.9  MG 2.1  --   --   --   --   --     Liver Function Tests:  Recent Labs Lab 2015-06-25 2003  AST 18  ALT 9*  ALKPHOS 46  BILITOT 1.3*  PROT 7.4  ALBUMIN 3.3*   No results for input(s): LIPASE, AMYLASE in the last 168 hours. No results for input(s): AMMONIA in the last 168 hours.  CBC:  Recent Labs Lab 06/25/15 2003 06/11/15 0420  WBC 4.6 5.0  NEUTROABS 2.9  --   HGB 12.0 11.9*  HCT 35.0* 34.9*  MCV 64.9* 63.9*  PLT 143* 129*    Cardiac Enzymes:  Recent Labs Lab 06/25/2015 2003 06/08/15 0420  TROPONINI 0.11* 0.09*    BNP: BNP (last 3 results)  Recent Labs  03/15/15 2150 03/22/15 1601 06-25-2015 1800  BNP 701.4* 930.9* 1217.7*    ProBNP (last 3 results)  Recent Labs  11/08/14 1800  PROBNP 2681.0*      Other results:  Imaging: US Abdomen Limited  06/04/2015   CLINICAL DATA:  64 year old female in  whom evaluation for bladder distension and ascites is requested. Initial encounter.  EXAM: LIMITED ABDOMEN ULTRASOUND FOR ASCITES  TECHNIQUE: Limited ultrasound survey for ascites was performed in all four abdominal quadrants.  COMPARISON:  CT Abdomen and Pelvis 03/16/2015  FINDINGS: Ascites is detected within the abdomen. There is free fluid in the pelvis and in the right upper quadrant (image 4, image 10). Trace if any left upper quadrant fluid. Small volume of ascites overall.  Within the pelvis the bladder is decompressed and a Foley catheter balloon is visible (image 5).  IMPRESSION: 1. Positive for ascites. But small volume of fluid overall, may be quantity insufficient for paracentesis. 2. Bladder appears decompressed, a Foley catheter balloon evident within the  lumen.   Electronically Signed   By: Odessa Fleming M.D.   On: 06/16/2015 07:29   Dg Chest Port 1 View  06/08/2015   CLINICAL DATA:  Shortness of breath today.  EXAM: PORTABLE CHEST - 1 VIEW  COMPARISON:  06/08/2015  FINDINGS: There has been interval placement of a right jugular Swan-Ganz catheter with tip projecting in the region of the right interlobar artery. Pre-existing right jugular central venous catheter remains in place, terminating over the SVC. Single lead AICD remains in place. Cardiac silhouette remains enlarged. There is improved aeration of the right lung base, although right lower lobe opacity remains. Veiling opacity persists in the right hemi thorax, compatible with a pleural effusion. No definite left pleural effusion or pneumothorax is identified. Pulmonary vascular congestion has decreased.  IMPRESSION: 1. New Swan-Ganz catheter as above. 2. Improved pulmonary vascular congestion and improved aeration of the right lung base. Persistent right pleural effusion.   Electronically Signed   By: Sebastian Ache   On: 06/28/2015 13:38     Medications:     Scheduled Medications: . antiseptic oral rinse  7 mL Mouth Rinse BID  . calcitRIOL  0.25 mcg Oral Daily  . DULoxetine  60 mg Oral Daily  . ferrous sulfate  325 mg Oral TID WC  . gabapentin  200 mg Oral QHS  . magnesium oxide  400 mg Oral TID  . pantoprazole  40 mg Oral Daily  . rOPINIRole  2 mg Oral QHS  . sodium chloride  10-40 mL Intracatheter Q12H  . sodium chloride  3 mL Intravenous Q12H  . zolpidem  5 mg Oral QHS    Infusions: . DOBUTamine 5 mcg/kg/min (06/11/15 0800)  . furosemide (LASIX) infusion 20 mg/hr (06/11/15 0800)  . heparin 900 Units/hr (06/11/15 0800)  . norepinephrine (LEVOPHED) Adult infusion 5 mcg/min (06/11/15 0800)    PRN Medications: sodium chloride, acetaminophen, nitroGLYCERIN, ondansetron (ZOFRAN) IV, ondansetron, polyethylene glycol, sodium chloride, sodium chloride, sodium chloride,  traMADol-acetaminophen   Assessment/Plan    1. 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 CAD S/P PCI with DES to prox RCA and stent to distal RCA in 2012. No s/s of ischemia.  3. PSVT - quiescent. No longer on amio 4. Acute on chronic renal failure, stage IV 5. Hyperkalemia  She continues to deteriorate despite support with IABP and dual pressors. Urine output and co-ox up slightly but creatinine much worse. I reviewed echo personally and LV function now much worse and RV at least moderately HK, if not worse. I am not sure that, even if we can get her through this period that her RV would be able to support a VAD. We discussed the possibility of a trial of CVVHD and theya re willing to proceed  if needed. I called her partner [name] and updated her. I will increase lasix to 30/hr and add metolazone. Will increase levophed as well. Give dose of kayexalate. I have discussed with Renal.   The patient is critically ill with multiple organ systems failure and requires high complexity decision making for assessment and support, frequent evaluation and titration of therapies, application of advanced monitoring technologies and extensive interpretation of multiple databases.   Critical Care Time devoted to patient care services described in this note is 45 Minutes.    Length of Stay: 4 Landrey Mahurin MD 06/11/2015, 10:40 AM Advanced Heart Failure Team Pager [phone] (M-F; 7a - 4p)  Please contact [facility] for night-coverage after hours (4p -7a ) and weekends on [email or web address]  \

## 2015-06-12 DIAGNOSIS — N189 Chronic kidney disease, unspecified: Secondary | ICD-10-CM

## 2015-06-12 DIAGNOSIS — I5023 Acute on chronic systolic (congestive) heart failure: Principal | ICD-10-CM

## 2015-06-12 DIAGNOSIS — R570 Cardiogenic shock: Secondary | ICD-10-CM

## 2015-06-12 DIAGNOSIS — N179 Acute kidney failure, unspecified: Secondary | ICD-10-CM

## 2015-06-12 LAB — RENAL FUNCTION PANEL
ANION GAP: 12 (ref 5–15)
Albumin: 3.2 g/dL — ABNORMAL LOW (ref 3.5–5.0)
Albumin: 3.2 g/dL — ABNORMAL LOW (ref 3.5–5.0)
Anion gap: 12 (ref 5–15)
BUN: 54 mg/dL — ABNORMAL HIGH (ref 6–20)
BUN: 35 mg/dL — ABNORMAL HIGH (ref 6–20)
CO2: 23 mmol/L (ref 22–32)
CO2: 24 mmol/L (ref 22–32)
CREATININE: 2.21 mg/dL — AB (ref 0.44–1.00)
Calcium: 8.5 mg/dL — ABNORMAL LOW (ref 8.9–10.3)
Calcium: 8.8 mg/dL — ABNORMAL LOW (ref 8.9–10.3)
Chloride: 99 mmol/L — ABNORMAL LOW (ref 101–111)
Chloride: 100 mmol/L — ABNORMAL LOW (ref 101–111)
Creatinine, Ser: 2.81 mg/dL — ABNORMAL HIGH (ref 0.44–1.00)
GFR calc Af Amer: 19 mL/min — ABNORMAL LOW (ref >60)
GFR calc Af Amer: 26 mL/min — ABNORMAL LOW (ref >60)
GFR calc non Af Amer: 22 mL/min — ABNORMAL LOW (ref >60)
GFR, EST NON AFRICAN AMERICAN: 17 mL/min — AB (ref >60)
GLUCOSE: 148 mg/dL — AB (ref 65–99)
Glucose, Bld: 121 mg/dL — ABNORMAL HIGH (ref 65–99)
Phosphorus: 4.5 mg/dL (ref 2.5–4.6)
Phosphorus: 3.1 mg/dL (ref 2.5–4.6)
Potassium: 4.2 mmol/L (ref 3.5–5.1)
Potassium: 4.2 mmol/L (ref 3.5–5.1)
SODIUM: 136 mmol/L (ref 135–145)
Sodium: 134 mmol/L — ABNORMAL LOW (ref 135–145)

## 2015-06-12 LAB — CBC
HEMATOCRIT: 34.2 % — AB (ref 36.0–46.0)
HEMOGLOBIN: 11.8 g/dL — AB (ref 12.0–15.0)
MCH: 22.4 pg — ABNORMAL LOW (ref 26.0–34.0)
MCHC: 34.5 g/dL (ref 30.0–36.0)
MCV: 65 fL — AB (ref 78.0–100.0)
Platelets: 92 10*3/uL — ABNORMAL LOW (ref 150–400)
RBC: 5.26 MIL/uL — AB (ref 3.87–5.11)
RDW: 20.8 % — AB (ref 11.5–15.5)
WBC: 4.4 10*3/uL (ref 4.0–10.5)

## 2015-06-12 LAB — CARBOXYHEMOGLOBIN
Carboxyhemoglobin: 0.9 % (ref 0.5–1.5)
METHEMOGLOBIN: 1 % (ref 0.0–1.5)
O2 Saturation: 72.8 %
Total hemoglobin: 11.7 g/dL — ABNORMAL LOW (ref 12.0–16.0)

## 2015-06-12 LAB — MAGNESIUM: Magnesium: 2.6 mg/dL — ABNORMAL HIGH (ref 1.7–2.4)

## 2015-06-12 LAB — HEPARIN LEVEL (UNFRACTIONATED)
HEPARIN UNFRACTIONATED: 0.18 [IU]/mL — AB (ref 0.30–0.70)
Heparin Unfractionated: 0.25 IU/mL — ABNORMAL LOW (ref 0.30–0.70)
Heparin Unfractionated: 0.11 IU/mL — ABNORMAL LOW (ref 0.30–0.70)

## 2015-06-12 LAB — CLOSTRIDIUM DIFFICILE BY PCR: Toxigenic C. Difficile by PCR: NEGATIVE

## 2015-06-12 NOTE — Progress Notes (Signed)
CVVHS Management.  Metabolically stable.  BP OK but on levophed and dobutamine.  Appetite poor.  Pulling 100cc/hr.  Time will tell if this will help.

## 2015-06-12 NOTE — Progress Notes (Signed)
ANTICOAGULATION CONSULT NOTE  Pharmacy Consult for Heparin  Indication: IABP  Allergies  Allergen Reactions  . Aspirin Shortness Of Breath and Other (See Comments)    Asthma symptoms  . Brilinta [Ticagrelor] Shortness Of Breath and Other (See Comments)    Gout   . Percocet [Oxycodone-Acetaminophen] Nausea And Vomiting    Patient can tolerate acetaminophen solely  . Darvon Nausea And Vomiting  . Diovan [Valsartan] Other (See Comments) and Nausea Only    unknown  . Imitrex [Sumatriptan Base] Other (See Comments)    Asthma symptoms, shortness of breath  . Licorice Flavor [Artificial Licorice Flavor]     Black licorice induces asthma   . Nsaids Nausea And Vomiting and Other (See Comments)    GI upset   . Tape Other (See Comments)    Tears skin off.  Paper tape only please.    Patient Measurements: Height: 5' (152.4 cm) Weight: 174 lb 6.1 oz (79.1 kg) IBW/kg (Calculated) : 45.5  Vital Signs: Temp: 97 F (36.1 C) (06/12 2200) Temp Source: Core (Comment) (06/12 2200) BP: 118/83 mmHg (06/12 2200) Pulse Rate: 104 (06/12 2000)  Labs:  Recent Labs  06/11/15 0420  06/11/15 1800  06/12/15 0415 06/12/15 1130 06/12/15 1940 06/12/15 2225  HGB 11.9*  --   --   --  11.8*  --   --   --   HCT 34.9*  --   --   --  34.2*  --   --   --   PLT 129*  --   --   --  92*  --   --   --   HEPARINUNFRC 0.12*  < >  --   < > 0.25* 0.11*  --  0.18*  CREATININE 4.07*  --  3.70*  --  2.81*  --  2.21*  --   < > = values in this interval not displayed.  Estimated Creatinine Clearance: 24.2 mL/min (by C-G formula based on Cr of 2.21).   Assessment: 64 y.o. female with IABP for heparin  Goal of Therapy:  Heparin level 0.2-0.5 units/ml Monitor platelets by anticoagulation protocol: Yes   Plan:  Increase Heparin 1250 units/hr Follow-up am labs.   Geannie Risen, PharmD, BCPS

## 2015-06-12 NOTE — Progress Notes (Addendum)
ANTICOAGULATION CONSULT NOTE - Follow Up Consult  Pharmacy Consult for Heparin  Indication: IABP  Allergies  Allergen Reactions  . Aspirin Shortness Of Breath and Other (See Comments)    Asthma symptoms  . Brilinta [Ticagrelor] Shortness Of Breath and Other (See Comments)    Gout   . Percocet [Oxycodone-Acetaminophen] Nausea And Vomiting    Patient can tolerate acetaminophen solely  . Darvon Nausea And Vomiting  . Diovan [Valsartan] Other (See Comments) and Nausea Only    unknown  . Imitrex [Sumatriptan Base] Other (See Comments)    Asthma symptoms, shortness of breath  . Licorice Flavor [Artificial Licorice Flavor]     Black licorice induces asthma   . Nsaids Nausea And Vomiting and Other (See Comments)    GI upset   . Tape Other (See Comments)    Tears skin off.  Paper tape only please.    Patient Measurements: Height: 5' (152.4 cm) Weight: 174 lb 6.1 oz (79.1 kg) IBW/kg (Calculated) : 45.5  Vital Signs: Temp: 95.7 F (35.4 C) (06/12 0453) Temp Source: Core (Comment) (06/12 0400) BP: 103/66 mmHg (06/12 0400) Pulse Rate: 98 (06/12 0400)  Labs:  Recent Labs  06/15/2015 1150  06-25-2015 1530  06/11/15 0420 06/11/15 1251 06/11/15 1800 06/11/15 2130 06/12/15 0415  HGB  --   --   --   --  11.9*  --   --   --   --   HCT  --   --   --   --  34.9*  --   --   --   --   PLT  --   --   --   --  129*  --   --   --   --   LABPROT 17.7*  --   --   --   --   --   --   --   --   INR 1.45  --   --   --   --   --   --   --   --   HEPARINUNFRC  --   --   --   < > 0.12* 0.23*  --  0.16* 0.25*  CREATININE  --   < > 3.82*  --  4.07*  --  3.70*  --   --   < > = values in this interval not displayed.  Estimated Creatinine Clearance: 14.5 mL/min (by C-G formula based on Cr of 3.7).   Assessment: Therapeutic heparin level, note lower goal with IABP in place, no issues per RN.   Goal of Therapy:  Heparin level 0.2-0.5 units/ml Monitor platelets by anticoagulation protocol: Yes   Plan:  -Continue heparin drip at 1000 units/hr -1200 HL to confirm -Daily CBC/HL -Monitor for bleeding  Andrea [name] 06/12/2015,4:56 AM  ____________________________________ Andrea [name]  Confirmatory HL returns subtherapeutic at 0.11. RN says there have been no issues with the line. No bleeding noted. Hgb 11.8, plts dropped to 92.  Plan: ncrease heparin to 1100 units/hr 8hr HL at 2200 Daily HL/CBC Monitor s/sx of bleeding Monitor platelets  Thank you for allowing pharmacy to be part of this patient's care team  Andrea Lalone M. Andrea [name], Pharm.D Clinical Pharmacy Resident Pager: [phone] 06/12/2015 .1:35 PM

## 2015-06-12 NOTE — Progress Notes (Signed)
Subjective:  Feeling better since starting CVVH.   Objective:  Temp:  [95 F (35 C)-99.1 F (37.3 C)] 96.4 F (35.8 C) (06/12 0700) Pulse Rate:  [98-100] 98 (06/12 0400) Resp:  [12-26] 17 (06/12 0700) BP: (98-130)/(58-104) 99/78 mmHg (06/12 0800) SpO2:  [96 %-100 %] 99 % (06/12 0700) Weight:  [174 lb 6.1 oz (79.1 kg)] 174 lb 6.1 oz (79.1 kg) (06/12 0257) Weight change: -7 lb 4.4 oz (-3.3 kg)  Intake/Output from previous day: 06/11 0701 - 06/12 0700 In: 2132.2 [P.O.:970; I.V.:1162.2] Out: 2151 [Urine:435; Stool:230]  Intake/Output from this shift: Total I/O In: -  Out: 100 [Other:100]  Physical Exam: General appearance: alert and no distress Neck: no adenopathy, no carotid bruit, no JVD, supple, symmetrical, trachea midline and thyroid not enlarged, symmetric, no tenderness/mass/nodules Lungs: clear to auscultation bilaterally Heart: regular rate and rhythm, S1, S2 normal, no murmur, click, rub or gallop Extremities: trace edema  Lab Results: Results for orders placed or performed during the hospital encounter of 06/08/2015 (from the past 48 hour(s))  I-STAT 3, venous blood gas (G3P V)     Status: Abnormal   Collection Time: 06/27/2015 10:47 AM  Result Value Ref Range   pH, Ven 7.331 (H) 7.250 - 7.300   pCO2, Ven 36.4 (L) 45.0 - 50.0 mmHg   pO2, Ven 26.0 (LL) 30.0 - 45.0 mmHg   Bicarbonate 19.2 (L) 20.0 - 24.0 mEq/L   TCO2 20 0 - 100 mmol/L   O2 Saturation 45.0 %   Acid-base deficit 6.0 (H) 0.0 - 2.0 mmol/L   Sample type VENOUS    Comment NOTIFIED PHYSICIAN   I-STAT 3, venous blood gas (G3P V)     Status: Abnormal   Collection Time: 06/23/2015 10:48 AM  Result Value Ref Range   pH, Ven 7.329 (H) 7.250 - 7.300   pCO2, Ven 36.6 (L) 45.0 - 50.0 mmHg   pO2, Ven 29.0 (LL) 30.0 - 45.0 mmHg   Bicarbonate 19.2 (L) 20.0 - 24.0 mEq/L   TCO2 20 0 - 100 mmol/L   O2 Saturation 50.0 %   Acid-base deficit 6.0 (H) 0.0 - 2.0 mmol/L   Sample type VENOUS    Comment NOTIFIED  PHYSICIAN   Carboxyhemoglobin     Status: None   Collection Time: 06/06/2015  3:20 PM  Result Value Ref Range   Total hemoglobin 12.1 12.0 - 16.0 g/dL   O2 Saturation 63.0 %   Carboxyhemoglobin 1.0 0.5 - 1.5 %   Methemoglobin 1.0 0.0 - 1.5 %  Surgical pcr screen     Status: Abnormal   Collection Time: 06/21/2015  3:24 PM  Result Value Ref Range   MRSA, PCR NEGATIVE NEGATIVE   Staphylococcus aureus POSITIVE (A) NEGATIVE    Comment:        The Xpert SA Assay (FDA approved for NASAL specimens in patients over 41 years of age), is one component of a comprehensive surveillance program.  Test performance has been validated by [facility] for patients greater than or equal to 47 year old. It is not intended to diagnose infection nor to guide or monitor treatment.   Basic metabolic panel     Status: Abnormal   Collection Time: 06/05/2015  3:30 PM  Result Value Ref Range   Sodium 133 (L) 135 - 145 mmol/L   Potassium 5.3 (H) 3.5 - 5.1 mmol/L   Chloride 100 (L) 101 - 111 mmol/L   CO2 19 (L) 22 - 32 mmol/L  Glucose, Bld 172 (H) 65 - 99 mg/dL   BUN 84 (H) 6 - 20 mg/dL   Creatinine, Ser 3.82 (H) 0.44 - 1.00 mg/dL   Calcium 9.1 8.9 - 10.3 mg/dL   GFR calc non Af Amer 12 (L) >60 mL/min   GFR calc Af Amer 13 (L) >60 mL/min    Comment: (NOTE) The eGFR has been calculated using the CKD EPI equation. This calculation has not been validated in all clinical situations. eGFR's persistently <60 mL/min signify possible Chronic Kidney Disease.    Anion gap 14 5 - 15  Heparin level (unfractionated)     Status: Abnormal   Collection Time: 06/05/2015  7:38 PM  Result Value Ref Range   Heparin Unfractionated 0.18 (L) 0.30 - 0.70 IU/mL    Comment:        IF HEPARIN RESULTS ARE BELOW EXPECTED VALUES, AND PATIENT DOSAGE HAS BEEN CONFIRMED, SUGGEST FOLLOW UP TESTING OF ANTITHROMBIN III LEVELS.   Carboxyhemoglobin     Status: None   Collection Time: 06/11/15  4:19 AM  Result Value Ref Range   Total  hemoglobin 12.3 12.0 - 16.0 g/dL   O2 Saturation 65.5 %   Carboxyhemoglobin 0.9 0.5 - 1.5 %   Methemoglobin 1.0 0.0 - 1.5 %  Basic metabolic panel     Status: Abnormal   Collection Time: 06/11/15  4:20 AM  Result Value Ref Range   Sodium 133 (L) 135 - 145 mmol/L   Potassium 5.4 (H) 3.5 - 5.1 mmol/L   Chloride 102 101 - 111 mmol/L   CO2 20 (L) 22 - 32 mmol/L   Glucose, Bld 137 (H) 65 - 99 mg/dL   BUN 88 (H) 6 - 20 mg/dL   Creatinine, Ser 4.07 (H) 0.44 - 1.00 mg/dL   Calcium 8.9 8.9 - 10.3 mg/dL   GFR calc non Af Amer 11 (L) >60 mL/min   GFR calc Af Amer 12 (L) >60 mL/min    Comment: (NOTE) The eGFR has been calculated using the CKD EPI equation. This calculation has not been validated in all clinical situations. eGFR's persistently <60 mL/min signify possible Chronic Kidney Disease.    Anion gap 11 5 - 15  CBC     Status: Abnormal   Collection Time: 06/11/15  4:20 AM  Result Value Ref Range   WBC 5.0 4.0 - 10.5 K/uL   RBC 5.46 (H) 3.87 - 5.11 MIL/uL   Hemoglobin 11.9 (L) 12.0 - 15.0 g/dL   HCT 34.9 (L) 36.0 - 46.0 %   MCV 63.9 (L) 78.0 - 100.0 fL   MCH 21.8 (L) 26.0 - 34.0 pg   MCHC 34.1 30.0 - 36.0 g/dL   RDW 20.6 (H) 11.5 - 15.5 %   Platelets 129 (L) 150 - 400 K/uL  Heparin level (unfractionated)     Status: Abnormal   Collection Time: 06/11/15  4:20 AM  Result Value Ref Range   Heparin Unfractionated 0.12 (L) 0.30 - 0.70 IU/mL    Comment:        IF HEPARIN RESULTS ARE BELOW EXPECTED VALUES, AND PATIENT DOSAGE HAS BEEN CONFIRMED, SUGGEST FOLLOW UP TESTING OF ANTITHROMBIN III LEVELS.   Heparin level (unfractionated)     Status: Abnormal   Collection Time: 06/11/15 12:51 PM  Result Value Ref Range   Heparin Unfractionated 0.23 (L) 0.30 - 0.70 IU/mL    Comment:        IF HEPARIN RESULTS ARE BELOW EXPECTED VALUES, AND PATIENT DOSAGE HAS BEEN CONFIRMED, SUGGEST  FOLLOW UP TESTING OF ANTITHROMBIN III LEVELS.   Magnesium     Status: None   Collection Time: 06/11/15  12:51 PM  Result Value Ref Range   Magnesium 2.4 1.7 - 2.4 mg/dL  Renal function panel (daily at 1600)     Status: Abnormal   Collection Time: 06/11/15  6:00 PM  Result Value Ref Range   Sodium 129 (L) 135 - 145 mmol/L   Potassium 4.6 3.5 - 5.1 mmol/L   Chloride 96 (L) 101 - 111 mmol/L   CO2 19 (L) 22 - 32 mmol/L   Glucose, Bld 129 (H) 65 - 99 mg/dL   BUN 75 (H) 6 - 20 mg/dL   Creatinine, Ser 3.70 (H) 0.44 - 1.00 mg/dL   Calcium 8.2 (L) 8.9 - 10.3 mg/dL   Phosphorus 5.4 (H) 2.5 - 4.6 mg/dL   Albumin 3.0 (L) 3.5 - 5.0 g/dL   GFR calc non Af Amer 12 (L) >60 mL/min   GFR calc Af Amer 14 (L) >60 mL/min    Comment: (NOTE) The eGFR has been calculated using the CKD EPI equation. This calculation has not been validated in all clinical situations. eGFR's persistently <60 mL/min signify possible Chronic Kidney Disease.    Anion gap 14 5 - 15  Heparin level (unfractionated)     Status: Abnormal   Collection Time: 06/11/15  9:30 PM  Result Value Ref Range   Heparin Unfractionated 0.16 (L) 0.30 - 0.70 IU/mL    Comment:        IF HEPARIN RESULTS ARE BELOW EXPECTED VALUES, AND PATIENT DOSAGE HAS BEEN CONFIRMED, SUGGEST FOLLOW UP TESTING OF ANTITHROMBIN III LEVELS.   CBC     Status: Abnormal   Collection Time: 06/12/15  4:15 AM  Result Value Ref Range   WBC 4.4 4.0 - 10.5 K/uL   RBC 5.26 (H) 3.87 - 5.11 MIL/uL   Hemoglobin 11.8 (L) 12.0 - 15.0 g/dL   HCT 34.2 (L) 36.0 - 46.0 %   MCV 65.0 (L) 78.0 - 100.0 fL   MCH 22.4 (L) 26.0 - 34.0 pg   MCHC 34.5 30.0 - 36.0 g/dL   RDW 20.8 (H) 11.5 - 15.5 %   Platelets 92 (L) 150 - 400 K/uL    Comment: REPEATED TO VERIFY PLATELET COUNT CONFIRMED BY SMEAR   Heparin level (unfractionated)     Status: Abnormal   Collection Time: 06/12/15  4:15 AM  Result Value Ref Range   Heparin Unfractionated 0.25 (L) 0.30 - 0.70 IU/mL    Comment:        IF HEPARIN RESULTS ARE BELOW EXPECTED VALUES, AND PATIENT DOSAGE HAS BEEN CONFIRMED, SUGGEST FOLLOW  UP TESTING OF ANTITHROMBIN III LEVELS.   Magnesium     Status: Abnormal   Collection Time: 06/12/15  4:15 AM  Result Value Ref Range   Magnesium 2.6 (H) 1.7 - 2.4 mg/dL  Renal function panel     Status: Abnormal   Collection Time: 06/12/15  4:15 AM  Result Value Ref Range   Sodium 134 (L) 135 - 145 mmol/L   Potassium 4.2 3.5 - 5.1 mmol/L   Chloride 99 (L) 101 - 111 mmol/L   CO2 23 22 - 32 mmol/L   Glucose, Bld 121 (H) 65 - 99 mg/dL   BUN 54 (H) 6 - 20 mg/dL   Creatinine, Ser 2.81 (H) 0.44 - 1.00 mg/dL   Calcium 8.5 (L) 8.9 - 10.3 mg/dL   Phosphorus 4.5 2.5 - 4.6 mg/dL   Albumin  3.2 (L) 3.5 - 5.0 g/dL   GFR calc non Af Amer 17 (L) >60 mL/min   GFR calc Af Amer 19 (L) >60 mL/min    Comment: (NOTE) The eGFR has been calculated using the CKD EPI equation. This calculation has not been validated in all clinical situations. eGFR's persistently <60 mL/min signify possible Chronic Kidney Disease.    Anion gap 12 5 - 15  Carboxyhemoglobin     Status: Abnormal   Collection Time: 06/12/15  4:25 AM  Result Value Ref Range   Total hemoglobin 11.7 (L) 12.0 - 16.0 g/dL   O2 Saturation 72.8 %   Carboxyhemoglobin 0.9 0.5 - 1.5 %   Methemoglobin 1.0 0.0 - 1.5 %    Imaging: Imaging results have been reviewed  Assessment/Plan:   1. Active Problems: 2.   Renal failure, acute on chronic 3.   Acute on chronic systolic CHF (congestive heart failure), NYHA class 4 4.   Cardiogenic shock 5.   Time Spent Directly with Patient:  20 minutes  Length of Stay:  LOS: 5 days   Critically ill pt with CGS, EF 15% with RV dysfunction as well. On Dobut and levophed. IABP 1:1 with MAP about 70. CI 1.5 L/min/m2 and PAD 26. I/O neg about 100 cc/hr since starting CVVH and SCr decreased from 4---->> 2.8. Co-Ox 70. Overall #s look better . Dr PVT has evaluated as well for possible LVAD but RV dysfunction may preclude this. No changes in Rx at this point.   Quay Burow 06/12/2015, 8:25 AM

## 2015-06-12 NOTE — Progress Notes (Signed)
  Dr. Allyson Sabal has seen patient this am. Please refer to his note.   I saw her as well. Remains on CVVHD, IABP/milrinone and levophed.   Volume status and mental status much improved. Co-ox now up to 73%.  Will continue current support. Once euvolemic will withdraw CVVHD. If renal function recovers will then have to assess whether she will be LVAD candidate. RV concnerning on echo but may not be prohibitive.  Andrea Newlun,MD 11:06 AM

## 2015-06-13 ENCOUNTER — Encounter: Payer: Self-pay | Admitting: Internal Medicine

## 2015-06-13 ENCOUNTER — Inpatient Hospital Stay (HOSPITAL_COMMUNITY): Payer: BLUE CROSS/BLUE SHIELD

## 2015-06-13 LAB — RENAL FUNCTION PANEL
ALBUMIN: 3.2 g/dL — AB (ref 3.5–5.0)
ANION GAP: 8 (ref 5–15)
Albumin: 3.1 g/dL — ABNORMAL LOW (ref 3.5–5.0)
Anion gap: 10 (ref 5–15)
BUN: 26 mg/dL — ABNORMAL HIGH (ref 6–20)
BUN: 18 mg/dL (ref 6–20)
CALCIUM: 8.4 mg/dL — AB (ref 8.9–10.3)
CHLORIDE: 100 mmol/L — AB (ref 101–111)
CO2: 24 mmol/L (ref 22–32)
CO2: 26 mmol/L (ref 22–32)
CREATININE: 1.97 mg/dL — AB (ref 0.44–1.00)
CREATININE: 1.8 mg/dL — AB (ref 0.44–1.00)
Calcium: 8.5 mg/dL — ABNORMAL LOW (ref 8.9–10.3)
Chloride: 101 mmol/L (ref 101–111)
GFR calc Af Amer: 30 mL/min — ABNORMAL LOW (ref >60)
GFR calc Af Amer: 33 mL/min — ABNORMAL LOW (ref >60)
GFR calc non Af Amer: 29 mL/min — ABNORMAL LOW (ref >60)
GFR, EST NON AFRICAN AMERICAN: 26 mL/min — AB (ref >60)
Glucose, Bld: 117 mg/dL — ABNORMAL HIGH (ref 65–99)
Glucose, Bld: 130 mg/dL — ABNORMAL HIGH (ref 65–99)
PHOSPHORUS: 3 mg/dL (ref 2.5–4.6)
POTASSIUM: 4.2 mmol/L (ref 3.5–5.1)
Phosphorus: 2.5 mg/dL (ref 2.5–4.6)
Potassium: 4 mmol/L (ref 3.5–5.1)
Sodium: 134 mmol/L — ABNORMAL LOW (ref 135–145)
Sodium: 135 mmol/L (ref 135–145)

## 2015-06-13 LAB — CBC
HCT: 36.6 % (ref 36.0–46.0)
HCT: 35.7 % — ABNORMAL LOW (ref 36.0–46.0)
Hemoglobin: 12.1 g/dL (ref 12.0–15.0)
Hemoglobin: 12.1 g/dL (ref 12.0–15.0)
MCH: 21.5 pg — AB (ref 26.0–34.0)
MCH: 22.4 pg — ABNORMAL LOW (ref 26.0–34.0)
MCHC: 33.1 g/dL (ref 30.0–36.0)
MCHC: 33.9 g/dL (ref 30.0–36.0)
MCV: 65 fL — AB (ref 78.0–100.0)
MCV: 66 fL — ABNORMAL LOW (ref 78.0–100.0)
Platelets: 70 10*3/uL — ABNORMAL LOW (ref 150–400)
Platelets: 71 10*3/uL — ABNORMAL LOW (ref 150–400)
RBC: 5.63 MIL/uL — ABNORMAL HIGH (ref 3.87–5.11)
RBC: 5.41 MIL/uL — ABNORMAL HIGH (ref 3.87–5.11)
RDW: 20.8 % — AB (ref 11.5–15.5)
RDW: 21 % — ABNORMAL HIGH (ref 11.5–15.5)
WBC: 5.9 10*3/uL (ref 4.0–10.5)
WBC: 6 10*3/uL (ref 4.0–10.5)

## 2015-06-13 LAB — URINALYSIS, ROUTINE W REFLEX MICROSCOPIC
Glucose, UA: NEGATIVE mg/dL
KETONES UR: 15 mg/dL — AB
NITRITE: POSITIVE — AB
Protein, ur: >300 mg/dL — AB
SPECIFIC GRAVITY, URINE: 1.021 (ref 1.005–1.030)
Urobilinogen, UA: 1 mg/dL (ref 0.0–1.0)
pH: 5 (ref 5.0–8.0)

## 2015-06-13 LAB — CARBOXYHEMOGLOBIN
Carboxyhemoglobin: 1 % (ref 0.5–1.5)
Methemoglobin: 0.9 % (ref 0.0–1.5)
O2 Saturation: 70.8 %
Total hemoglobin: 12.5 g/dL (ref 12.0–16.0)

## 2015-06-13 LAB — URINE MICROSCOPIC-ADD ON

## 2015-06-13 LAB — HEPARIN LEVEL (UNFRACTIONATED): Heparin Unfractionated: 0.37 IU/mL (ref 0.30–0.70)

## 2015-06-13 LAB — MAGNESIUM: MAGNESIUM: 2.4 mg/dL (ref 1.7–2.4)

## 2015-06-13 MED ORDER — FERROUS SULFATE 325 (65 FE) MG PO TABS
325.0000 mg | ORAL_TABLET | Freq: Three times a day (TID) | ORAL | Status: DC
  Administered 2015-06-13 – 2015-07-02 (×54): 325 mg via ORAL
  Filled 2015-06-13 (×64): qty 1

## 2015-06-13 MED FILL — Heparin Sodium (Porcine) 2 Unit/ML in Sodium Chloride 0.9%: INTRAMUSCULAR | Qty: 1500 | Status: AC

## 2015-06-13 NOTE — Progress Notes (Signed)
RT bedside to change out pressure bags on due lines. PA line changed, Rn noticed a change in readings  as well as waveform. Dr B in hallway called to room, troubleshooted, CXR ordered to be sure of placement. Will cont to monitor. NAD, pt voices she does not feel any different. VSS.

## 2015-06-13 NOTE — Progress Notes (Signed)
ANTICOAGULATION CONSULT NOTE  Pharmacy Consult for Heparin  Indication: IABP  Allergies  Allergen Reactions  . Aspirin Shortness Of Breath and Other (See Comments)    Asthma symptoms  . Brilinta [Ticagrelor] Shortness Of Breath and Other (See Comments)    Gout   . Percocet [Oxycodone-Acetaminophen] Nausea And Vomiting    Patient can tolerate acetaminophen solely  . Darvon Nausea And Vomiting  . Diovan [Valsartan] Other (See Comments) and Nausea Only    unknown  . Imitrex [Sumatriptan Base] Other (See Comments)    Asthma symptoms, shortness of breath  . Licorice Flavor [Artificial Licorice Flavor]     Black licorice induces asthma   . Nsaids Nausea And Vomiting and Other (See Comments)    GI upset   . Tape Other (See Comments)    Tears skin off.  Paper tape only please.    Patient Measurements: Height: 5' (152.4 cm) Weight: 170 lb 3.1 oz (77.2 kg) IBW/kg (Calculated) : 45.5 Heparin Dosing Weight: 66kg  Vital Signs: Temp: 96.4 F (35.8 C) (06/13 0700) Temp Source: Core (Comment) (06/13 0700) BP: 117/84 mmHg (06/13 0700) Pulse Rate: 105 (06/13 0600)  Labs:  Recent Labs  06/11/15 0420  06/12/15 0415 06/12/15 1130 06/12/15 1940 06/12/15 2225 06/13/15 0428 06/13/15 0429 06/13/15 0500  HGB 11.9*  --  11.8*  --   --   --  12.1  --   --   HCT 34.9*  --  34.2*  --   --   --  36.6  --   --   PLT 129*  --  92*  --   --   --  70*  --   --   HEPARINUNFRC 0.12*  < > 0.25* 0.11*  --  0.18*  --  0.37  --   CREATININE 4.07*  < > 2.81*  --  2.21*  --   --   --  1.97*  < > = values in this interval not displayed.  Estimated Creatinine Clearance: 26.9 mL/min (by C-G formula based on Cr of 1.97).  Assessment: 63yof with Hx HF on milrinone PTA.  S/p RHC on milrinone and CI 1.5.  Milrinone stopped and changed to dobutamine 43mcg/kg/min and NE now titrated up to 10 to maintain BP.  COOX  59 improved 70 with DObut, NE and IABP.   Poor uop fluid removal per CVVHD.       Heparin  drip 1250 units/hr HL at goal 0.37.  H/h stable PLTC drop slightly on IABP - watch.   Goal of Therapy:  Heparin level 0.2-0.5 units/ml  Monitor platelets by anticoagulation protocol: Yes   Plan:  Continue Heparin drip 1250 uts/hr Daily CBC, HL  [name] Pharm.D. CPP, BCPS Clinical Pharmacist [phone] 06/13/2015 8:02 AM

## 2015-06-13 NOTE — Progress Notes (Signed)
Advanced Heart Failure Rounding Note   Subjective:    Admitted from HF clinic with increased dyspnea and volume overload.  On 6/10 underwent swan and IABP placement for persistent cardiogenic shock with co-ox in 40s and worsening renal failure. Now on CVVHD (started on 6/11) on dobutamine 5 and levophed 5. Co-ox 71%  Breathing better. More awake.now essentially anuric.    Swan numbers: CVP 15 PA 37/22 (29) PCWP 22 Thermo 3.4/1.9  Echo reviewed personally LVEF 10% RV moderately HK. Severe MR/TR  Objective:   Weight Range:  Vital Signs:   Temp:  [96.1 F (35.6 C)-97.5 F (36.4 C)] 96.4 F (35.8 C) (06/13 0700) Pulse Rate:  [102-105] 105 (06/13 0600) Resp:  [15-25] 18 (06/13 0700) BP: (99-130)/(64-91) 117/84 mmHg (06/13 0700) SpO2:  [98 %-100 %] 98 % (06/13 0700) Weight:  [77.2 kg (170 lb 3.1 oz)] 77.2 kg (170 lb 3.1 oz) (06/13 0429) Last BM Date: 06/12/15  Weight change: Filed Weights   06/11/15 0437 06/12/15 0257 06/13/15 0429  Weight: 82.4 kg (181 lb 10.5 oz) 79.1 kg (174 lb 6.1 oz) 77.2 kg (170 lb 3.1 oz)    Intake/Output:   Intake/Output Summary (Last 24 hours) at 06/13/15 0748 Last data filed at 06/13/15 0700  Gross per 24 hour  Intake 1358.55 ml  Output   3202 ml  Net -1843.45 ml     PHYSICAL EXAM: General: Lying flat in bed. weak HEENT: normal Neck: supple. JVP to jaw . RIJ swan Carotids 2+ bilaterally; no bruits. No lymphadenopathy or thryomegaly appreciated. Cor: PMI normal. Regular No rubs,2/6 MR/TR +S3 Lungs: clear Abdomen: soft, tender, nondistended. No hepatosplenomegaly. No bruits or masses. Good bowel sounds. Extremities: no cyanosis, clubbing, rash, R and LLE 1+ edema,IABP in left groin. CVVHD catheter in R groin  Neurro: alert & orientedx3, cranial nerves grossly intact. Moves all 4 extremities w/o difficulty. Affect pleasant          Telemetry: Sinus   Labs: Basic Metabolic Panel:  Recent Labs Lab 06-22-15 2003   06/11/15 0420 06/11/15 1251 06/11/15 1800 06/12/15 0415 06/12/15 1940 06/13/15 0428 06/13/15 0500  NA 135  < > 133*  --  129* 134* 136  --  134*  K 3.8  < > 5.4*  --  4.6 4.2 4.2  --  4.0  CL 102  < > 102  --  96* 99* 100*  --  100*  CO2 20*  < > 20*  --  19* 23 24  --  24  GLUCOSE 119*  < > 137*  --  129* 121* 148*  --  117*  BUN 71*  < > 88*  --  75* 54* 35*  --  26*  CREATININE 2.66*  < > 4.07*  --  3.70* 2.81* 2.21*  --  1.97*  CALCIUM 9.1  < > 8.9  --  8.2* 8.5* 8.8*  --  8.4*  MG 2.1  --   --  2.4  --  2.6*  --  2.4  --   PHOS  --   --   --   --  5.4* 4.5 3.1  --  3.0  < > = values in this interval not displayed.  Liver Function Tests:  Recent Labs Lab June 22, 2015 2003 06/11/15 1800 06/12/15 0415 06/12/15 1940 06/13/15 0500  AST 18  --   --   --   --   ALT 9*  --   --   --   --   Jolly Mango  46  --   --   --   --   BILITOT 1.3*  --   --   --   --   PROT 7.4  --   --   --   --   ALBUMIN 3.3* 3.0* 3.2* 3.2* 3.2*   No results for input(s): LIPASE, AMYLASE in the last 168 hours. No results for input(s): AMMONIA in the last 168 hours.  CBC:  Recent Labs Lab 2015/06/12 2003 06/11/15 0420 06/12/15 0415 06/13/15 0428  WBC 4.6 5.0 4.4 5.9  NEUTROABS 2.9  --   --   --   HGB 12.0 11.9* 11.8* 12.1  HCT 35.0* 34.9* 34.2* 36.6  MCV 64.9* 63.9* 65.0* 65.0*  PLT 143* 129* 92* 70*    Cardiac Enzymes:  Recent Labs Lab 06/12/15 2003 06/08/15 0420  TROPONINI 0.11* 0.09*    BNP: BNP (last 3 results)  Recent Labs  03/15/15 2150 03/22/15 1601 2015-06-12 1800  BNP 701.4* 930.9* 1217.7*    ProBNP (last 3 results)  Recent Labs  11/08/14 1800  PROBNP 2681.0*      Other results:  Imaging: No results found.   Medications:     Scheduled Medications: . antiseptic oral rinse  7 mL Mouth Rinse BID  . calcitRIOL  0.25 mcg Oral Daily  . DULoxetine  60 mg Oral Daily  . ferrous sulfate  325 mg Oral TID WC  . gabapentin  200 mg Oral QHS  . pantoprazole  40 mg  Oral Daily  . rOPINIRole  2 mg Oral QHS  . sodium chloride  10-40 mL Intracatheter Q12H  . sodium chloride  3 mL Intravenous Q12H  . zolpidem  5 mg Oral QHS    Infusions: . DOBUTamine 5 mcg/kg/min (06/12/15 2008)  . heparin 1,250 Units/hr (06/12/15 2258)  . norepinephrine (LEVOPHED) Adult infusion 10 mcg/min (06/12/15 2000)  . dialysis replacement fluid (prismasate) 300 mL/hr at 06/11/15 1535  . dialysis replacement fluid (prismasate) 200 mL/hr at 06/12/15 1854  . dialysate (PRISMASATE) 1,500 mL/hr at 06/13/15 0654    PRN Medications: sodium chloride, acetaminophen, heparin, heparin, nitroGLYCERIN, ondansetron (ZOFRAN) IV, ondansetron, polyethylene glycol, sodium chloride, sodium chloride, sodium chloride, traMADol-acetaminophen   Assessment/Plan    1. 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 CAD S/P PCI with DES to prox RCA and stent to distal RCA in 2012. No s/s of ischemia.  3. PSVT - quiescent. No longer on amio 4. Acute on chronic renal failure, stage IV 5. Hyperkalemia 6. Thrombocytopenia  She continues to deteriorate despite support with IABP and dual pressors. Now essentially anuric. Hemodynamics improved. Suspect Fick output more accurate than thermodilution due to severe TR. Platelets low due to IABP. Situation becoming more grim by the day.  I reviewed echo personally and LV function now much worse and RV at least moderately HK, if not worse. I am not sure that, even if we can get her through this period that her RV would be able to support a VAD.  Will optimize volume status with CVVHD. Once optimized will stop CVVHD. If kidney function doesn't improve sufficiently will need transition to comfort care.   Check CBC this afternoon to reassess platelets.   The patient is critically ill with multiple organ systems failure and requires high complexity decision making for assessment and support, frequent evaluation and titration of  therapies, application of advanced monitoring technologies and extensive interpretation of multiple databases.   Critical Care Time devoted to patient care services  described in this note is 45 Minutes.    Length of Stay: 6 Bensimhon, Daniel MD 06/13/2015, 7:48 AM Advanced Heart Failure Team Pager [phone] (M-F; 7a - 4p)  Please contact [facility] for night-coverage after hours (4p -7a ) and weekends on [email or web address]  \

## 2015-06-13 NOTE — Procedures (Signed)
Admit: 06/23/2015 LOS: 6  12F w/ biventricular cardiogenic shock, BL SCr 2.6, progressive oliguria, with IABG+Dobutamine+NE  Current CRRT Prescription: Start Date: 06/11/15 Catheter: R Femoral BFR: 100 Pre Blood Pump: 300 4K DFR: 1500 4K Replacement Rate: 200 4K Goal UF: net negative 50-14mL/hr Anticoagulation: systemic heparin Clotting: infrequent  S: Tolerating gentle UF Infrequent clotting Some appetite Cardiology notes reviewed  O: 06/12 0701 - 06/13 0700 In: 1358.6 [P.O.:600; I.V.:758.6] Out: 3202 [Urine:120; Stool:50]  Filed Weights   06/11/15 0437 06/12/15 0257 06/13/15 0429  Weight: 82.4 kg (181 lb 10.5 oz) 79.1 kg (174 lb 6.1 oz) 77.2 kg (170 lb 3.1 oz)     Recent Labs Lab 06/12/15 0415 06/12/15 1940 06/13/15 0500  NA 134* 136 134*  K 4.2 4.2 4.0  CL 99* 100* 100*  CO2 23 24 24   GLUCOSE 121* 148* 117*  BUN 54* 35* 26*  CREATININE 2.81* 2.21* 1.97*  CALCIUM 8.5* 8.8* 8.4*  PHOS 4.5 3.1 3.0    Recent Labs Lab 06/22/2015 2003 06/11/15 0420 06/12/15 0415 06/13/15 0428  WBC 4.6 5.0 4.4 5.9  NEUTROABS 2.9  --   --   --   HGB 12.0 11.9* 11.8* 12.1  HCT 35.0* 34.9* 34.2* 36.6  MCV 64.9* 63.9* 65.0* 65.0*  PLT 143* 129* 92* 70*    Scheduled Meds: . antiseptic oral rinse  7 mL Mouth Rinse BID  . calcitRIOL  0.25 mcg Oral Daily  . DULoxetine  60 mg Oral Daily  . ferrous sulfate  325 mg Oral TID WC  . gabapentin  200 mg Oral QHS  . pantoprazole  40 mg Oral Daily  . rOPINIRole  2 mg Oral QHS  . sodium chloride  10-40 mL Intracatheter Q12H  . sodium chloride  3 mL Intravenous Q12H  . zolpidem  5 mg Oral QHS   Continuous Infusions: . DOBUTamine 5 mcg/kg/min (06/12/15 2008)  . heparin 1,250 Units/hr (06/12/15 2258)  . norepinephrine (LEVOPHED) Adult infusion 10 mcg/min (06/12/15 2000)  . dialysis replacement fluid (prismasate) 300 mL/hr at 06/11/15 1535  . dialysis replacement fluid (prismasate) 200 mL/hr at 06/12/15 1854  . dialysate (PRISMASATE)  1,500 mL/hr at 06/13/15 0654   PRN Meds:.sodium chloride, acetaminophen, heparin, heparin, nitroGLYCERIN, ondansetron (ZOFRAN) IV, ondansetron, polyethylene glycol, sodium chloride, sodium chloride, sodium chloride, traMADol-acetaminophen  ABG    Component Value Date/Time   PHART 7.369 04/12/2014 0811   PCO2ART 38.6 04/12/2014 0811   PO2ART 62.0* 04/12/2014 0811   HCO3 19.2* 06/19/2015 1048   TCO2 20 06/13/2015 1048   ACIDBASEDEF 6.0* 06/15/2015 1048   O2SAT 70.8 06/13/2015 0414    A/P  1. Dialysis dependent AoCKD 1. BL SC4 mid 2s 2. Start CRRT 6/11 for Hypervolemia and Progressive low UOP 3. Cont at current CRRT settings and goal UF 2. Biventricular Systolic Cardiogenic Shock On Dobutamine + NE + IABB 1. Per cardiology 2. Cont UF 3. Hx/o CAD and PCI  Sabra Heck, MD [facility] pgr [phone]

## 2015-06-13 NOTE — Progress Notes (Signed)
06/12/15  6 beat run of V-Tach. Pt without complaint.  Decreased pt fluid removal on CRRT to -50. Ashtynn Berke, Linnell Fulling

## 2015-06-14 LAB — RENAL FUNCTION PANEL
ALBUMIN: 3.1 g/dL — AB (ref 3.5–5.0)
Albumin: 3.1 g/dL — ABNORMAL LOW (ref 3.5–5.0)
Anion gap: 8 (ref 5–15)
Anion gap: 8 (ref 5–15)
BUN: 14 mg/dL (ref 6–20)
BUN: 11 mg/dL (ref 6–20)
CALCIUM: 8.7 mg/dL — AB (ref 8.9–10.3)
CALCIUM: 8.6 mg/dL — AB (ref 8.9–10.3)
CO2: 26 mmol/L (ref 22–32)
CO2: 25 mmol/L (ref 22–32)
CREATININE: 1.74 mg/dL — AB (ref 0.44–1.00)
CREATININE: 1.57 mg/dL — AB (ref 0.44–1.00)
Chloride: 101 mmol/L (ref 101–111)
Chloride: 100 mmol/L — ABNORMAL LOW (ref 101–111)
GFR calc Af Amer: 35 mL/min — ABNORMAL LOW (ref >60)
GFR calc Af Amer: 39 mL/min — ABNORMAL LOW (ref >60)
GFR calc non Af Amer: 34 mL/min — ABNORMAL LOW (ref >60)
GFR, EST NON AFRICAN AMERICAN: 30 mL/min — AB (ref >60)
Glucose, Bld: 113 mg/dL — ABNORMAL HIGH (ref 65–99)
Glucose, Bld: 117 mg/dL — ABNORMAL HIGH (ref 65–99)
Phosphorus: 2.2 mg/dL — ABNORMAL LOW (ref 2.5–4.6)
Phosphorus: 2.1 mg/dL — ABNORMAL LOW (ref 2.5–4.6)
Potassium: 4.3 mmol/L (ref 3.5–5.1)
Potassium: 4.3 mmol/L (ref 3.5–5.1)
SODIUM: 135 mmol/L (ref 135–145)
Sodium: 133 mmol/L — ABNORMAL LOW (ref 135–145)

## 2015-06-14 LAB — CBC
HCT: 36 % (ref 36.0–46.0)
Hemoglobin: 11.9 g/dL — ABNORMAL LOW (ref 12.0–15.0)
MCH: 22.1 pg — AB (ref 26.0–34.0)
MCHC: 33.1 g/dL (ref 30.0–36.0)
MCV: 66.8 fL — ABNORMAL LOW (ref 78.0–100.0)
PLATELETS: 60 10*3/uL — AB (ref 150–400)
RBC: 5.39 MIL/uL — AB (ref 3.87–5.11)
RDW: 21.1 % — ABNORMAL HIGH (ref 11.5–15.5)
WBC: 6.1 10*3/uL (ref 4.0–10.5)

## 2015-06-14 LAB — CARBOXYHEMOGLOBIN
Carboxyhemoglobin: 1.1 % (ref 0.5–1.5)
Methemoglobin: 0.8 % (ref 0.0–1.5)
O2 Saturation: 69.3 %
TOTAL HEMOGLOBIN: 11.4 g/dL — AB (ref 12.0–16.0)

## 2015-06-14 LAB — HEPARIN LEVEL (UNFRACTIONATED): HEPARIN UNFRACTIONATED: 0.35 [IU]/mL (ref 0.30–0.70)

## 2015-06-14 LAB — MAGNESIUM: MAGNESIUM: 2.4 mg/dL (ref 1.7–2.4)

## 2015-06-14 NOTE — Progress Notes (Addendum)
Advanced Heart Failure Rounding Note   Subjective:    Admitted from HF clinic with increased dyspnea and volume overload.  On 6/10 underwent swan and IABP placement for persistent cardiogenic shock with co-ox in 40s and worsening renal failure. Now on CVVHD (started on 6/11) Remains on  dobutamine 5 and levophed 10 .   Remains on IABP 1:1. CO-OX today 69%.  Weight up 2 pounds. I/O out -1.5L. Pull rate limited by BP yesterday. Better today. Wide awake, No dyspnea. Remains anuric  Swan numbers: CVP 13 PA 34/22 (28)  PCWP 26 Thermo 2.9/1.69  Fick: 4.1/2.2  Echo LVEF 10% RV moderately HK. Severe MR/TR  Objective:   Weight Range:  Vital Signs:   Temp:  [96.6 F (35.9 C)-97.7 F (36.5 C)] 97.5 F (36.4 C) (06/14 0700) Resp:  [12-24] 20 (06/14 0700) BP: (107-133)/(65-99) 129/93 mmHg (06/14 0700) SpO2:  [90 %-100 %] 96 % (06/14 0700) Weight:  [172 lb 6.4 oz (78.2 kg)] 172 lb 6.4 oz (78.2 kg) (06/14 0500) Last BM Date: 06/12/15  Weight change: Filed Weights   06/12/15 0257 06/13/15 0429 06/14/15 0500  Weight: 174 lb 6.1 oz (79.1 kg) 170 lb 3.1 oz (77.2 kg) 172 lb 6.4 oz (78.2 kg)    Intake/Output:   Intake/Output Summary (Last 24 hours) at 06/14/15 0734 Last data filed at 06/14/15 0700  Gross per 24 hour  Intake 1067.2 ml  Output   2534 ml  Net -1466.8 ml     PHYSICAL EXAM: General: Lying flat in bed. weak HEENT: normal Neck: supple. JVP to jaw . RIJ swan Carotids 2+ bilaterally; no bruits. No lymphadenopathy or thryomegaly appreciated. Cor: PMI normal. Regular No rubs,2/6 MR/TR +S3 Lungs: clear Abdomen: soft, tender, nondistended. No hepatosplenomegaly. No bruits or masses. Good bowel sounds. Extremities: no cyanosis, clubbing, rash, R and LLE 1+ edema,IABP in left groin. R and L pedal pulse 2+  CVVHD catheter in R groin  Neurro: alert & orientedx3, cranial nerves grossly intact. Moves all 4 extremities w/o difficulty. Affect pleasant          Telemetry:  Sinus   Labs: Basic Metabolic Panel:  Recent Labs Lab 06/03/2015 2003  06/11/15 1251  06/12/15 0415 06/12/15 1940 06/13/15 0428 06/13/15 0500 06/13/15 1600 06/14/15 0400  NA 135  < >  --   < > 134* 136  --  134* 135 135  K 3.8  < >  --   < > 4.2 4.2  --  4.0 4.2 4.3  CL 102  < >  --   < > 99* 100*  --  100* 101 101  CO2 20*  < >  --   < > 23 24  --  24 26 26   GLUCOSE 119*  < >  --   < > 121* 148*  --  117* 130* 113*  BUN 71*  < >  --   < > 54* 35*  --  26* 18 14  CREATININE 2.66*  < >  --   < > 2.81* 2.21*  --  1.97* 1.80* 1.74*  CALCIUM 9.1  < >  --   < > 8.5* 8.8*  --  8.4* 8.5* 8.7*  MG 2.1  --  2.4  --  2.6*  --  2.4  --   --  2.4  PHOS  --   --   --   < > 4.5 3.1  --  3.0 2.5 2.2*  < > = values in this interval  not displayed.  Liver Function Tests:  Recent Labs Lab 06/29/2015 2003  06/12/15 0415 06/12/15 1940 06/13/15 0500 06/13/15 1600 06/14/15 0400  AST 18  --   --   --   --   --   --   ALT 9*  --   --   --   --   --   --   ALKPHOS 46  --   --   --   --   --   --   BILITOT 1.3*  --   --   --   --   --   --   PROT 7.4  --   --   --   --   --   --   ALBUMIN 3.3*  < > 3.2* 3.2* 3.2* 3.1* 3.1*  < > = values in this interval not displayed. No results for input(s): LIPASE, AMYLASE in the last 168 hours. No results for input(s): AMMONIA in the last 168 hours.  CBC:  Recent Labs Lab 06/11/2015 2003 06/11/15 0420 06/12/15 0415 06/13/15 0428 06/13/15 1400 06/14/15 0400  WBC 4.6 5.0 4.4 5.9 6.0 6.1  NEUTROABS 2.9  --   --   --   --   --   HGB 12.0 11.9* 11.8* 12.1 12.1 11.9*  HCT 35.0* 34.9* 34.2* 36.6 35.7* 36.0  MCV 64.9* 63.9* 65.0* 65.0* 66.0* 66.8*  PLT 143* 129* 92* 70* 71* 60*    Cardiac Enzymes:  Recent Labs Lab 06/11/2015 2003 06/08/15 0420  TROPONINI 0.11* 0.09*    BNP: BNP (last 3 results)  Recent Labs  03/15/15 2150 03/22/15 1601 06/21/2015 1800  BNP 701.4* 930.9* 1217.7*    ProBNP (last 3 results)  Recent Labs  11/08/14 1800   PROBNP 2681.0*      Other results:  Imaging: Dg Chest 1 View  06/13/2015   CLINICAL DATA:  Evaluate pulmonary artery catheter placement  EXAM: CHEST  1 VIEW  COMPARISON:  07-02-2015  FINDINGS: Cardiomegaly.  No frank interstitial edema.  Moderate layering right pleural effusion.  Right IJ Swan-Ganz catheter terminates in the right main pulmonary artery.  Left subclavian ICD.  IUD P in the descending thoracic aorta.a  IMPRESSION: Right IJ Swan-Ganz catheter terminates in the right main pulmonary artery.   Electronically Signed   By: Charline Bills M.D.   On: 06/13/2015 18:37     Medications:     Scheduled Medications: . antiseptic oral rinse  7 mL Mouth Rinse BID  . calcitRIOL  0.25 mcg Oral Daily  . DULoxetine  60 mg Oral Daily  . ferrous sulfate  325 mg Oral TID WC  . gabapentin  200 mg Oral QHS  . pantoprazole  40 mg Oral Daily  . rOPINIRole  2 mg Oral QHS  . sodium chloride  10-40 mL Intracatheter Q12H  . sodium chloride  3 mL Intravenous Q12H  . zolpidem  5 mg Oral QHS    Infusions: . DOBUTamine 5 mcg/kg/min (06/12/15 2008)  . heparin 1,250 Units/hr (06/13/15 1926)  . norepinephrine (LEVOPHED) Adult infusion 10 mcg/min (06/13/15 2014)  . dialysis replacement fluid (prismasate) 300 mL/hr at 06/13/15 1929  . dialysis replacement fluid (prismasate) 200 mL/hr at 06/13/15 1929  . dialysate (PRISMASATE) 1,500 mL/hr at 06/14/15 0556    PRN Medications: sodium chloride, acetaminophen, heparin, heparin, nitroGLYCERIN, ondansetron (ZOFRAN) IV, ondansetron, polyethylene glycol, sodium chloride, sodium chloride, sodium chloride, traMADol-acetaminophen   Assessment/Plan    1. Acute Chronic Systolic HF -> cardiogenic shock with biventricular  failure 2. CAD S/P PCI with DES to prox RCA and stent to distal RCA in 2012. No s/s of ischemia.  3. PSVT - quiescent. No longer on amio 4. Acute on chronic renal failure, stage IV 5. Hyperkalemia 6. Thrombocytopenia  Remains  anuric.   Platelets low due to IABP 77>60. Marland KitchenECHO showed LV function now much worse and RV at least moderately HK.   Will optimize volume status with CVVHD. Once optimized will stop CVVHD. If kidney function doesn't improve sufficiently will need transition to comfort care.   The patient is critically ill with multiple organ systems failure and requires high complexity decision making for assessment and support, frequent evaluation and titration of therapies, application of advanced monitoring technologies and extensive interpretation of multiple databases.   Critical Care Time devoted to patient care services described in this note is 45 Minutes.  Length of Stay: 7 CLEGG,AMY NP-C  06/14/2015, 7:34 AM Advanced Heart Failure Team Pager [phone] (M-F; 7a - 4p)  Please contact [facility] for night-coverage after hours (4p -7a ) and weekends on [email or web address]  Patient seen and examined with Tonye Becket, NP. We discussed all aspects of the encounter. I agree with the assessment and plan as stated above.   Remains anuric. Cardiac output much improved buy Fick. Suspect thermodilution numbers underestimated. Will continue full support with IABP, CVVHD and inotropes. Next step depends on recovery of renal function. I have d/w Dr. Marisue Humble and will continue CVVHD throughout the week and then pull back and see if kidneys recover. If kidneys don't recover will need transition to comfort care. If kidneys do recover will consider VAD placement though she will be an extremely high-risk candidate and RV dysfunction may be prohibitive. We will cross that bridge when we come to it.   Watch platelets.   The patient is critically ill with multiple organ systems failure and requires high complexity decision making for assessment and support, frequent evaluation and titration of therapies, application of advanced monitoring technologies and extensive interpretation of multiple databases.   Critical Care Time devoted to  patient care services described in this note is 35 Minutes.  Bensimhon, Daniel,MD 9:30 AM

## 2015-06-14 NOTE — Procedures (Signed)
Admit: Jun 17, 2015 LOS: 7  61F w/ biventricular cardiogenic shock, BL SCr 2.6, progressive oliguria, with IABG+Dobutamine+NE  Current CRRT Prescription: Start Date: 06/11/15 Catheter: R Femoral BFR: 100 Pre Blood Pump: 300 4K DFR: 1500 4K Replacement Rate: 200 4K Goal UF: net negative 50-167mL/hr Anticoagulation: systemic heparin Clotting: infrequent  S: Tolerating gentle UF Not clotting Some appetite Cardiology notes reviewed  O: 06/13 0701 - 06/14 0700 In: 1067.2 [P.O.:160; I.V.:907.2] Out: 2534 [Urine:25]  Filed Weights   06/12/15 0257 06/13/15 0429 06/14/15 0500  Weight: 79.1 kg (174 lb 6.1 oz) 77.2 kg (170 lb 3.1 oz) 78.2 kg (172 lb 6.4 oz)     Recent Labs Lab 06/13/15 0500 06/13/15 1600 06/14/15 0400  NA 134* 135 135  K 4.0 4.2 4.3  CL 100* 101 101  CO2 24 26 26   GLUCOSE 117* 130* 113*  BUN 26* 18 14  CREATININE 1.97* 1.80* 1.74*  CALCIUM 8.4* 8.5* 8.7*  PHOS 3.0 2.5 2.2*    Recent Labs Lab 06-17-15 2003  06/13/15 0428 06/13/15 1400 06/14/15 0400  WBC 4.6  < > 5.9 6.0 6.1  NEUTROABS 2.9  --   --   --   --   HGB 12.0  < > 12.1 12.1 11.9*  HCT 35.0*  < > 36.6 35.7* 36.0  MCV 64.9*  < > 65.0* 66.0* 66.8*  PLT 143*  < > 70* 71* 60*  < > = values in this interval not displayed.  Scheduled Meds: . antiseptic oral rinse  7 mL Mouth Rinse BID  . calcitRIOL  0.25 mcg Oral Daily  . DULoxetine  60 mg Oral Daily  . ferrous sulfate  325 mg Oral TID WC  . gabapentin  200 mg Oral QHS  . pantoprazole  40 mg Oral Daily  . rOPINIRole  2 mg Oral QHS  . sodium chloride  10-40 mL Intracatheter Q12H  . sodium chloride  3 mL Intravenous Q12H  . zolpidem  5 mg Oral QHS   Continuous Infusions: . DOBUTamine 5 mcg/kg/min (06/12/15 2008)  . heparin 1,250 Units/hr (06/13/15 1926)  . norepinephrine (LEVOPHED) Adult infusion 8 mcg/min (06/14/15 1200)  . dialysis replacement fluid (prismasate) 300 mL/hr at 06/13/15 1929  . dialysis replacement fluid (prismasate) 200  mL/hr at 06/13/15 1929  . dialysate (PRISMASATE) 1,500 mL/hr at 06/14/15 0930   PRN Meds:.sodium chloride, acetaminophen, heparin, heparin, nitroGLYCERIN, ondansetron (ZOFRAN) IV, ondansetron, polyethylene glycol, sodium chloride, sodium chloride, sodium chloride, traMADol-acetaminophen  ABG    Component Value Date/Time   PHART 7.369 04/12/2014 0811   PCO2ART 38.6 04/12/2014 0811   PO2ART 62.0* 04/12/2014 0811   HCO3 19.2* 06/21/2015 1048   TCO2 20 06/21/2015 1048   ACIDBASEDEF 6.0* 06/16/2015 1048   O2SAT 69.3 06/14/2015 0720    A/P  1. Dialysis dependent AoCKD 1. BL SC4 mid 2s 2. Start CRRT 6/11 for Hypervolemia and Progressive low UOP 3. Cont at current CRRT settings and goal UF 4. Agree with attempting to obtain euvolemia and then stop HD; anuria is very ominous 2. Biventricular Systolic Cardiogenic Shock On Dobutamine + NE + IABP 1. Per cardiology 2. Cont UF 3. Hx/o CAD and PCI  Sabra Heck, MD [facility] pgr [phone]

## 2015-06-14 NOTE — Progress Notes (Signed)
ANTICOAGULATION CONSULT NOTE  Pharmacy Consult for Heparin  Indication: IABP  Allergies  Allergen Reactions  . Aspirin Shortness Of Breath and Other (See Comments)    Asthma symptoms  . Brilinta [Ticagrelor] Shortness Of Breath and Other (See Comments)    Gout   . Percocet [Oxycodone-Acetaminophen] Nausea And Vomiting    Patient can tolerate acetaminophen solely  . Darvon Nausea And Vomiting  . Diovan [Valsartan] Other (See Comments) and Nausea Only    unknown  . Imitrex [Sumatriptan Base] Other (See Comments)    Asthma symptoms, shortness of breath  . Licorice Flavor [Artificial Licorice Flavor]     Black licorice induces asthma   . Nsaids Nausea And Vomiting and Other (See Comments)    GI upset   . Tape Other (See Comments)    Tears skin off.  Paper tape only please.    Patient Measurements: Height: 5' (152.4 cm) Weight: 172 lb 6.4 oz (78.2 kg) IBW/kg (Calculated) : 45.5 Heparin Dosing Weight: 66kg  Vital Signs: Temp: 97.5 F (36.4 C) (06/14 0700) BP: 129/93 mmHg (06/14 0700)  Labs:  Recent Labs  06/12/15 2225 06/13/15 0428 06/13/15 0429 06/13/15 0500 06/13/15 1400 06/13/15 1600 06/14/15 0400  HGB  --  12.1  --   --  12.1  --  11.9*  HCT  --  36.6  --   --  35.7*  --  36.0  PLT  --  70*  --   --  71*  --  60*  HEPARINUNFRC 0.18*  --  0.37  --   --   --  0.35  CREATININE  --   --   --  1.97*  --  1.80* 1.74*    Estimated Creatinine Clearance: 30.6 mL/min (by C-G formula based on Cr of 1.74).  Assessment: 63yof with Hx HF on milrinone PTA.  S/p RHC on milrinone and CI 1.5.  Milrinone stopped and changed to dobutamine 24mcg/kg/min and NE now titrated up to 10 to maintain BP and IABP to improve CO.  COOX  59 improved 70 with DObut, NE and IABP.   Poor uop - 39ml overnight,  fluid removal per CVVHD.       Heparin drip 1250 units/hr HL at goal 0.35.  H/h stable PLTC drop slightly on IABP - watch.   Goal of Therapy:  Heparin level 0.2-0.5 units/ml  Monitor  platelets by anticoagulation protocol: Yes   Plan:  Continue Heparin drip 1250 uts/hr Daily CBC, HL  [name] Pharm.D. CPP, BCPS Clinical Pharmacist [phone] 06/14/2015 8:10 AM

## 2015-06-15 ENCOUNTER — Encounter: Payer: Self-pay | Admitting: Internal Medicine

## 2015-06-15 ENCOUNTER — Inpatient Hospital Stay (HOSPITAL_COMMUNITY): Payer: BLUE CROSS/BLUE SHIELD

## 2015-06-15 LAB — HEPARIN LEVEL (UNFRACTIONATED)
HEPARIN UNFRACTIONATED: 0.58 [IU]/mL (ref 0.30–0.70)
Heparin Unfractionated: 0.48 IU/mL (ref 0.30–0.70)

## 2015-06-15 LAB — CBC
HEMATOCRIT: 34.4 % — AB (ref 36.0–46.0)
HEMOGLOBIN: 11.3 g/dL — AB (ref 12.0–15.0)
MCH: 21.6 pg — ABNORMAL LOW (ref 26.0–34.0)
MCHC: 32.8 g/dL (ref 30.0–36.0)
MCV: 65.9 fL — AB (ref 78.0–100.0)
Platelets: 52 10*3/uL — ABNORMAL LOW (ref 150–400)
RBC: 5.22 MIL/uL — ABNORMAL HIGH (ref 3.87–5.11)
RDW: 20.8 % — ABNORMAL HIGH (ref 11.5–15.5)
WBC: 5.4 10*3/uL (ref 4.0–10.5)

## 2015-06-15 LAB — RENAL FUNCTION PANEL
ALBUMIN: 3.1 g/dL — AB (ref 3.5–5.0)
Albumin: 3 g/dL — ABNORMAL LOW (ref 3.5–5.0)
Anion gap: 7 (ref 5–15)
Anion gap: 9 (ref 5–15)
BUN: 8 mg/dL (ref 6–20)
BUN: 7 mg/dL (ref 6–20)
CALCIUM: 8.4 mg/dL — AB (ref 8.9–10.3)
CHLORIDE: 101 mmol/L (ref 101–111)
CO2: 26 mmol/L (ref 22–32)
CO2: 25 mmol/L (ref 22–32)
CREATININE: 1.53 mg/dL — AB (ref 0.44–1.00)
CREATININE: 1.51 mg/dL — AB (ref 0.44–1.00)
Calcium: 8.6 mg/dL — ABNORMAL LOW (ref 8.9–10.3)
Chloride: 100 mmol/L — ABNORMAL LOW (ref 101–111)
GFR calc Af Amer: 41 mL/min — ABNORMAL LOW (ref >60)
GFR calc Af Amer: 41 mL/min — ABNORMAL LOW (ref >60)
GFR calc non Af Amer: 35 mL/min — ABNORMAL LOW (ref >60)
GFR calc non Af Amer: 36 mL/min — ABNORMAL LOW (ref >60)
GLUCOSE: 116 mg/dL — AB (ref 65–99)
Glucose, Bld: 114 mg/dL — ABNORMAL HIGH (ref 65–99)
Phosphorus: 2.3 mg/dL — ABNORMAL LOW (ref 2.5–4.6)
Phosphorus: 2 mg/dL — ABNORMAL LOW (ref 2.5–4.6)
Potassium: 4.3 mmol/L (ref 3.5–5.1)
Potassium: 4.4 mmol/L (ref 3.5–5.1)
Sodium: 134 mmol/L — ABNORMAL LOW (ref 135–145)
Sodium: 134 mmol/L — ABNORMAL LOW (ref 135–145)

## 2015-06-15 LAB — CARBOXYHEMOGLOBIN
CARBOXYHEMOGLOBIN: 1.4 % (ref 0.5–1.5)
Methemoglobin: 1.3 % (ref 0.0–1.5)
O2 SAT: 58.9 %
TOTAL HEMOGLOBIN: 11.5 g/dL — AB (ref 12.0–16.0)

## 2015-06-15 MED ORDER — SODIUM CHLORIDE 0.9 % IV SOLN
INTRAVENOUS | Status: DC
  Administered 2015-06-15 – 2015-06-29 (×3): 250 mL via INTRAVENOUS

## 2015-06-15 MED ORDER — HEPARIN (PORCINE) IN NACL 100-0.45 UNIT/ML-% IJ SOLN
1050.0000 [IU]/h | INTRAMUSCULAR | Status: DC
  Administered 2015-06-15 – 2015-06-16 (×2): 1150 [IU]/h via INTRAVENOUS
  Administered 2015-06-17: 1050 [IU]/h via INTRAVENOUS
  Administered 2015-06-18 (×2): 900 [IU]/h via INTRAVENOUS
  Administered 2015-06-19 – 2015-06-22 (×4): 1050 [IU]/h via INTRAVENOUS
  Filled 2015-06-15 (×16): qty 250

## 2015-06-15 MED ORDER — PRO-STAT 64 PO LIQD
30.0000 mL | Freq: Three times a day (TID) | ORAL | Status: DC
Start: 2015-06-15 — End: 2015-06-17
  Administered 2015-06-16 (×2): 30 mL via ORAL
  Filled 2015-06-15 (×8): qty 30

## 2015-06-15 MED ORDER — ENSURE ENLIVE PO LIQD
237.0000 mL | ORAL | Status: DC
  Administered 2015-06-15 – 2015-06-16 (×2): 237 mL via ORAL

## 2015-06-15 NOTE — Progress Notes (Signed)
ANTICOAGULATION CONSULT NOTE  Pharmacy Consult for Heparin  Indication: IABP  Allergies  Allergen Reactions  . Aspirin Shortness Of Breath and Other (See Comments)    Asthma symptoms  . Brilinta [Ticagrelor] Shortness Of Breath and Other (See Comments)    Gout   . Percocet [Oxycodone-Acetaminophen] Nausea And Vomiting    Patient can tolerate acetaminophen solely  . Darvon Nausea And Vomiting  . Diovan [Valsartan] Other (See Comments) and Nausea Only    unknown  . Imitrex [Sumatriptan Base] Other (See Comments)    Asthma symptoms, shortness of breath  . Lactose Intolerance (Gi)   . Licorice Flavor [Artificial Licorice Flavor]     Black licorice induces asthma   . Nsaids Nausea And Vomiting and Other (See Comments)    GI upset   . Tape Other (See Comments)    Tears skin off.  Paper tape only please.    Patient Measurements: Height: 5' (152.4 cm) Weight: 165 lb 9.1 oz (75.1 kg) IBW/kg (Calculated) : 45.5 Heparin Dosing Weight: 66kg  Vital Signs: Temp: 98.6 F (37 C) (06/15 1500) Temp Source: Core (Comment) (06/15 1200) BP: 106/81 mmHg (06/15 1500) Pulse Rate: 104 (06/15 0800)  Labs:  Recent Labs  06/13/15 1400  06/14/15 0400 06/14/15 1600 06/15/15 0445 06/15/15 1400  HGB 12.1  --  11.9*  --  11.3*  --   HCT 35.7*  --  36.0  --  34.4*  --   PLT 71*  --  60*  --  52*  --   HEPARINUNFRC  --   --  0.35  --  0.58 0.48  CREATININE  --   < > 1.74* 1.57* 1.53*  --   < > = values in this interval not displayed.  Estimated Creatinine Clearance: 34 mL/min (by C-G formula based on Cr of 1.53).  Assessment: 63yof with Hx HF on milrinone PTA.  S/p RHC on milrinone and IABP.    Heparin drip 1250 units/hr heparin level slightly above lower goal at 0.58.  H/h stable PLTC drop slightly on IABP - watch.  Goal of Therapy:  Heparin level 0.2-0.5 units/ml  Monitor platelets by anticoagulation protocol: Yes   Plan:  Continue IV heparin at 1150 units/hr. Daily heparin level  and CBC.  Tad Moore, BCPS  Clinical Pharmacist Pager [phone]  06/15/2015 3:24 PM

## 2015-06-15 NOTE — Procedures (Signed)
Admit: 06/02/2015 LOS: 8  44F w/ biventricular cardiogenic shock, BL SCr 2.6, progressive oliguria, with IABG+Dobutamine+NE  Current CRRT Prescription: Start Date: 06/11/15 Catheter: R Femoral BFR: 100 Pre Blood Pump: 300 4K DFR: 1500 4K Replacement Rate: 200 4K Goal UF: net negative 50-133mL/hr Anticoagulation: systemic heparin Clotting: makes in 72h  S: Tolerating gentle UF Remains anuric Remains IABP+Dobut+NE dependent  O: 06/14 0701 - 06/15 0700 In: 1094.5 [P.O.:105; I.V.:879.5] Out: 3210 [Urine:50; Blood:150]  Filed Weights   06/13/15 0429 06/14/15 0500 06/15/15 0400  Weight: 77.2 kg (170 lb 3.1 oz) 78.2 kg (172 lb 6.4 oz) 75.1 kg (165 lb 9.1 oz)     Recent Labs Lab 06/14/15 0400 06/14/15 1600 06/15/15 0445  NA 135 133* 134*  K 4.3 4.3 4.3  CL 101 100* 101  CO2 26 25 26   GLUCOSE 113* 117* 116*  BUN 14 11 8   CREATININE 1.74* 1.57* 1.53*  CALCIUM 8.7* 8.6* 8.4*  PHOS 2.2* 2.1* 2.3*    Recent Labs Lab 06/13/15 1400 06/14/15 0400 06/15/15 0445  WBC 6.0 6.1 5.4  HGB 12.1 11.9* 11.3*  HCT 35.7* 36.0 34.4*  MCV 66.0* 66.8* 65.9*  PLT 71* 60* 52*    Scheduled Meds: . antiseptic oral rinse  7 mL Mouth Rinse BID  . calcitRIOL  0.25 mcg Oral Daily  . DULoxetine  60 mg Oral Daily  . ferrous sulfate  325 mg Oral TID WC  . gabapentin  200 mg Oral QHS  . pantoprazole  40 mg Oral Daily  . rOPINIRole  2 mg Oral QHS  . sodium chloride  10-40 mL Intracatheter Q12H  . sodium chloride  3 mL Intravenous Q12H  . zolpidem  5 mg Oral QHS   Continuous Infusions: . DOBUTamine 5 mcg/kg/min (06/15/15 0800)  . heparin 1,150 Units/hr (06/15/15 0730)  . norepinephrine (LEVOPHED) Adult infusion 8 mcg/min (06/15/15 0800)  . dialysis replacement fluid (prismasate) 300 mL/hr at 06/15/15 0646  . dialysis replacement fluid (prismasate) 200 mL/hr at 06/14/15 2240  . dialysate (PRISMASATE) 1,500 mL/hr at 06/15/15 0646   PRN Meds:.sodium chloride, acetaminophen, heparin,  heparin, nitroGLYCERIN, ondansetron (ZOFRAN) IV, ondansetron, polyethylene glycol, sodium chloride, sodium chloride, sodium chloride, traMADol-acetaminophen  ABG    Component Value Date/Time   PHART 7.369 04/12/2014 0811   PCO2ART 38.6 04/12/2014 0811   PO2ART 62.0* 04/12/2014 0811   HCO3 19.2* 06/18/2015 1048   TCO2 20 06/19/2015 1048   ACIDBASEDEF 6.0* 06/06/2015 1048   O2SAT 58.9 06/15/2015 0459    A/P  1. Dialysis dependent AoCKD 1. BL SC4 mid 2s 2. Start CRRT 6/11 for Hypervolemia and Progressive low UOP 3. Cont at current CRRT settings and goal UF 4. Agree with attempting to obtain euvolemia and then stop HD; anuria is very ominous 5. OK with foley removal 6. Metabolically doing well 2. Biventricular Systolic Cardiogenic Shock On Dobutamine + NE + IABP 1. Per cardiology 2. Cont UF 3. Hx/o CAD and PCI  Sabra Heck, MD [facility] pgr [phone]

## 2015-06-15 NOTE — Progress Notes (Signed)
Initial Nutrition Assessment  DOCUMENTATION CODES:  Non-severe (moderate) malnutrition in context of acute illness/injury  INTERVENTION:  Ensure Enlive (each supplement provides 350kcal and 20 grams of protein), Prostat  NUTRITION DIAGNOSIS:  Malnutrition related to acute illness as evidenced by energy intake < 75% for > 7 days, mild depletion of muscle mass.  GOAL:  Patient will meet greater than or equal to 90% of their needs  MONITOR:  PO intake, Supplement acceptance, Labs, Weight trends, I & O's, Skin  REASON FOR ASSESSMENT:  Malnutrition Screening Tool, Other (Comment) (Status change F/U)    ASSESSMENT: Admitted from HF clinic with increased dyspnea and volume overload. On 6/10 underwent swan and IABP placement for persistent cardiogenic shock with co-ox in 40s and worsening renal failure. Now on CVVHD (started on 6/11).  Pt's condition declined since admission. Due to various procedures and poor appetite pt has been consuming 0-50% of her meals, most on liquid diet. Pt is also lactose intolerant, which further decreased her PO. Currently pt is on Renal diet, consumed 10% of her lunch. Pt states that she does not like meat much, but is agrreable to Prostat TID. Will also order Ensure daily as pt refuses Nepro. Discussed pt with RN who states pt has very guarded prognosis and has been very confused. She is worried that pt will not take her supplements and is ok with trying Ensure instead of Nepro. Pt is currently receiving CRRT. Nutrition focused exam showed mild muscle mass depletion and pt notably lost 9 Lb in 8 days (5%, significant for time frame), but it is unclear how much of it is due to fluids. Will continue to monitor.  Labs reviewed: Na 134, Cr 1.53, Ca 8.4, Glu 116, Phos 2.3    Height:  Ht Readings from Last 1 Encounters:  06/03/2015 5' (1.524 m)    Weight:  Wt Readings from Last 1 Encounters:  06/15/15 165 lb 9.1 oz (75.1 kg)    Ideal Body Weight:  48  kg  Wt Readings from Last 10 Encounters:  06/15/15 165 lb 9.1 oz (75.1 kg)  06/11/2015 174 lb 8 oz (79.153 kg)  05/31/15 179 lb (81.194 kg)  04/13/15 172 lb (78.019 kg)  03/26/15 181 lb 4.8 oz (82.237 kg)  03/15/15 178 lb 8 oz (80.967 kg)  02/03/15 180 lb (81.647 kg)  01/17/15 177 lb 3.2 oz (80.377 kg)  12/13/14 184 lb 7 oz (83.66 kg)  11/15/14 188 lb 12.8 oz (85.639 kg)    BMI:  Body mass index is 32.33 kg/(m^2).  Estimated Nutritional Needs:  Kcal:  1600 - 1800  Protein:  120 - 140 g  Fluid:  per MD  Skin:  Reviewed, no issues  Diet Order:  Diet renal with fluid restriction Fluid restriction:: 1500 mL Fluid; Room service appropriate?: Yes; Fluid consistency:: Thin  EDUCATION NEEDS:  Education needs addressed   Intake/Output Summary (Last 24 hours) at 06/15/15 1624 Last data filed at 06/15/15 1600  Gross per 24 hour  Intake 1208.49 ml  Output   3612 ml  Net -2403.51 ml    Last BM:  6/14  Nicky Kras A. [facility] Dietetic Intern Pager: [phone] 06/15/2015 4:24 PM

## 2015-06-15 NOTE — Progress Notes (Signed)
ANTICOAGULATION CONSULT NOTE  Pharmacy Consult for Heparin  Indication: IABP  Allergies  Allergen Reactions  . Aspirin Shortness Of Breath and Other (See Comments)    Asthma symptoms  . Brilinta [Ticagrelor] Shortness Of Breath and Other (See Comments)    Gout   . Percocet [Oxycodone-Acetaminophen] Nausea And Vomiting    Patient can tolerate acetaminophen solely  . Darvon Nausea And Vomiting  . Diovan [Valsartan] Other (See Comments) and Nausea Only    unknown  . Imitrex [Sumatriptan Base] Other (See Comments)    Asthma symptoms, shortness of breath  . Licorice Flavor [Artificial Licorice Flavor]     Black licorice induces asthma   . Nsaids Nausea And Vomiting and Other (See Comments)    GI upset   . Tape Other (See Comments)    Tears skin off.  Paper tape only please.    Patient Measurements: Height: 5' (152.4 cm) Weight: 165 lb 9.1 oz (75.1 kg) IBW/kg (Calculated) : 45.5 Heparin Dosing Weight: 66kg  Vital Signs: Temp: 98.4 F (36.9 C) (06/15 0900) Temp Source: Core (Comment) (06/15 0800) BP: 121/83 mmHg (06/15 0900) Pulse Rate: 104 (06/15 0800)  Labs:  Recent Labs  06/13/15 0429  06/13/15 1400  06/14/15 0400 06/14/15 1600 06/15/15 0445  HGB  --   --  12.1  --  11.9*  --  11.3*  HCT  --   --  35.7*  --  36.0  --  34.4*  PLT  --   --  71*  --  60*  --  52*  HEPARINUNFRC 0.37  --   --   --  0.35  --  0.58  CREATININE  --   < >  --   < > 1.74* 1.57* 1.53*  < > = values in this interval not displayed.  Estimated Creatinine Clearance: 34 mL/min (by C-G formula based on Cr of 1.53).  Assessment: 63yof with Hx HF on milrinone PTA.  S/p RHC on milrinone and IABP.    Heparin drip 1250 units/hr heparin level slightly above lower goal at 0.58.  H/h stable PLTC drop slightly on IABP - watch.  Goal of Therapy:  Heparin level 0.2-0.5 units/ml  Monitor platelets by anticoagulation protocol: Yes   Plan:  Decrease IV heparin to 1150 units/hr. Recheck heparin level  in 6 hrs. Daily heparin level and CBC.  Tad Moore, BCPS  Clinical Pharmacist Pager [phone]  06/15/2015 10:06 AM

## 2015-06-15 NOTE — Progress Notes (Signed)
Advanced Heart Failure Rounding Note   Subjective:    Admitted from HF clinic with increased dyspnea and volume overload.  On 6/10 underwent swan and IABP placement for persistent cardiogenic shock with co-ox in 40s and worsening renal failure. Now on CVVHD (started on 6/11) Remains on  dobutamine 5 and levophed 10 .   Remains on IABP 1:1. CO-OX today 58%.  Weight down 7 pounds. I/O out -2.2L.    Remains anuric  Swan numbers: CVP 14 PA 38/21 PCWP unable to wedge    Echo LVEF 10% RV moderately HK. Severe MR/TR  Objective:   Weight Range:  Vital Signs:   Temp:  [96.3 F (35.7 C)-98.6 F (37 C)] 97.3 F (36.3 C) (06/15 0700) Pulse Rate:  [98-103] 98 (06/15 0400) Resp:  [14-27] 18 (06/15 0700) BP: (110-132)/(67-94) 110/81 mmHg (06/15 0700) SpO2:  [95 %-100 %] 100 % (06/15 0700) Weight:  [165 lb 9.1 oz (75.1 kg)] 165 lb 9.1 oz (75.1 kg) (06/15 0400) Last BM Date: 06/14/15 (Flexi-Seal in place)  Weight change: Filed Weights   06/13/15 0429 06/14/15 0500 06/15/15 0400  Weight: 170 lb 3.1 oz (77.2 kg) 172 lb 6.4 oz (78.2 kg) 165 lb 9.1 oz (75.1 kg)    Intake/Output:   Intake/Output Summary (Last 24 hours) at 06/15/15 0721 Last data filed at 06/15/15 0700  Gross per 24 hour  Intake 1094.49 ml  Output   3210 ml  Net -2115.51 ml     PHYSICAL EXAM: General: Lying flat in bed. weak HEENT: normal Neck: supple. JVP to jaw . RIJ swan Carotids 2+ bilaterally; no bruits. No lymphadenopathy or thryomegaly appreciated. Cor: PMI normal. Regular No rubs,2/6 MR/TR +S3 Lungs: clear Abdomen: soft, tender, nondistended. No hepatosplenomegaly. No bruits or masses. Good bowel sounds. Extremities: no cyanosis, clubbing, rash, R and LLE 1+ edema,IABP in left groin. R and L pedal pulse 2+  CVVHD catheter in R groin  Neurro: alert & orientedx3, cranial nerves grossly intact. Moves all 4 extremities w/o difficulty. Affect pleasant          Telemetry: Sinus   Labs: Basic  Metabolic Panel:  Recent Labs Lab 06/11/15 1251  06/12/15 0415  06/13/15 0428 06/13/15 0500 06/13/15 1600 06/14/15 0400 06/14/15 1600 06/15/15 0445  NA  --   < > 134*  < >  --  134* 135 135 133* 134*  K  --   < > 4.2  < >  --  4.0 4.2 4.3 4.3 4.3  CL  --   < > 99*  < >  --  100* 101 101 100* 101  CO2  --   < > 23  < >  --  24 26 26 25 26   GLUCOSE  --   < > 121*  < >  --  117* 130* 113* 117* 116*  BUN  --   < > 54*  < >  --  26* 18 14 11 8   CREATININE  --   < > 2.81*  < >  --  1.97* 1.80* 1.74* 1.57* 1.53*  CALCIUM  --   < > 8.5*  < >  --  8.4* 8.5* 8.7* 8.6* 8.4*  MG 2.4  --  2.6*  --  2.4  --   --  2.4  --   --   PHOS  --   < > 4.5  < >  --  3.0 2.5 2.2* 2.1* 2.3*  < > = values in this interval not displayed.  Liver Function Tests:  Recent Labs Lab 06/13/15 0500 06/13/15 1600 06/14/15 0400 06/14/15 1600 06/15/15 0445  ALBUMIN 3.2* 3.1* 3.1* 3.1* 3.0*   No results for input(s): LIPASE, AMYLASE in the last 168 hours. No results for input(s): AMMONIA in the last 168 hours.  CBC:  Recent Labs Lab 06/12/15 0415 06/13/15 0428 06/13/15 1400 06/14/15 0400 06/15/15 0445  WBC 4.4 5.9 6.0 6.1 5.4  HGB 11.8* 12.1 12.1 11.9* 11.3*  HCT 34.2* 36.6 35.7* 36.0 34.4*  MCV 65.0* 65.0* 66.0* 66.8* 65.9*  PLT 92* 70* 71* 60* 52*    Cardiac Enzymes: No results for input(s): CKTOTAL, CKMB, CKMBINDEX, TROPONINI in the last 168 hours.  BNP: BNP (last 3 results)  Recent Labs  03/15/15 2150 03/22/15 1601 06/04/2015 1800  BNP 701.4* 930.9* 1217.7*    ProBNP (last 3 results)  Recent Labs  11/08/14 1800  PROBNP 2681.0*      Other results:  Imaging: Dg Chest 1 View  06/13/2015   CLINICAL DATA:  Evaluate pulmonary artery catheter placement  EXAM: CHEST  1 VIEW  COMPARISON:  06/30/2015  FINDINGS: Cardiomegaly.  No frank interstitial edema.  Moderate layering right pleural effusion.  Right IJ Swan-Ganz catheter terminates in the right main pulmonary artery.  Left  subclavian ICD.  IUD P in the descending thoracic aorta.a  IMPRESSION: Right IJ Swan-Ganz catheter terminates in the right main pulmonary artery.   Electronically Signed   By: Charline Bills M.D.   On: 06/13/2015 18:37     Medications:     Scheduled Medications: . antiseptic oral rinse  7 mL Mouth Rinse BID  . calcitRIOL  0.25 mcg Oral Daily  . DULoxetine  60 mg Oral Daily  . ferrous sulfate  325 mg Oral TID WC  . gabapentin  200 mg Oral QHS  . pantoprazole  40 mg Oral Daily  . rOPINIRole  2 mg Oral QHS  . sodium chloride  10-40 mL Intracatheter Q12H  . sodium chloride  3 mL Intravenous Q12H  . zolpidem  5 mg Oral QHS    Infusions: . DOBUTamine 5 mcg/kg/min (06/14/15 1930)  . heparin    . norepinephrine (LEVOPHED) Adult infusion 8 mcg/min (06/15/15 0300)  . dialysis replacement fluid (prismasate) 300 mL/hr at 06/15/15 0646  . dialysis replacement fluid (prismasate) 200 mL/hr at 06/14/15 2240  . dialysate (PRISMASATE) 1,500 mL/hr at 06/15/15 0646    PRN Medications: sodium chloride, acetaminophen, heparin, heparin, nitroGLYCERIN, ondansetron (ZOFRAN) IV, ondansetron, polyethylene glycol, sodium chloride, sodium chloride, sodium chloride, traMADol-acetaminophen   Assessment/Plan    1. 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 CAD S/P PCI with DES to prox RCA and stent to distal RCA in 2012. No s/s of ischemia.  3. PSVT - quiescent. No longer on amio 4. Acute on chronic renal failure, stage IV 5. Hyperkalemia 6. Thrombocytopenia  Remains IABP and dual inotropes dobutamine+ norepi.   Remains anuric.   Platelets low due to IABP 77>60>52. Marland KitchenECHO showed LV function now much worse and RV at least moderately HK.   Will optimize volume status with CVVHD. Once optimized will stop CVVHD. If kidney function doesn't improve sufficiently will need transition to comfort care.     Length of Stay: 8 CLEGG,AMY NP-C  06/15/2015, 7:21 AM Advanced  Heart Failure Team Pager [phone] (M-F; 7a - 4p)  Please contact [facility] for night-coverage after hours (4p -7a ) and weekends on [email or web address]  Patient seen and examined with Tonye Becket, NP. We  discussed all aspects of the encounter. I agree with the assessment and plan as stated above.  Feels better. Volume and hemodynamics improving with CVVHD but remains anuric. CVP/PCWP ratio of 67% concerning for significant RV failure. Will continue current support for next couple of days to optimize volume status with CVVHD. Once optimized will stop CVVHD. If kidney function doesn't improve sufficiently will need transition to comfort care.    The patient is critically ill with multiple organ systems failure and requires high complexity decision making for assessment and support, frequent evaluation and titration of therapies, application of advanced monitoring technologies and extensive interpretation of multiple databases.   Critical Care Time personallydevoted to patient care services described in this note is 40 Minutes.  Bensimhon, Daniel,MD 10:03 AM

## 2015-06-16 ENCOUNTER — Inpatient Hospital Stay (HOSPITAL_COMMUNITY): Payer: BLUE CROSS/BLUE SHIELD

## 2015-06-16 DIAGNOSIS — E440 Moderate protein-calorie malnutrition: Secondary | ICD-10-CM | POA: Insufficient documentation

## 2015-06-16 LAB — CBC
HCT: 33.6 % — ABNORMAL LOW (ref 36.0–46.0)
HEMOGLOBIN: 11.1 g/dL — AB (ref 12.0–15.0)
MCH: 22.2 pg — ABNORMAL LOW (ref 26.0–34.0)
MCHC: 33 g/dL (ref 30.0–36.0)
MCV: 67.2 fL — ABNORMAL LOW (ref 78.0–100.0)
Platelets: 61 10*3/uL — ABNORMAL LOW (ref 150–400)
RBC: 5 MIL/uL (ref 3.87–5.11)
RDW: 20.9 % — ABNORMAL HIGH (ref 11.5–15.5)
WBC: 5.6 10*3/uL (ref 4.0–10.5)

## 2015-06-16 LAB — RENAL FUNCTION PANEL
ALBUMIN: 3.2 g/dL — AB (ref 3.5–5.0)
ANION GAP: 7 (ref 5–15)
Albumin: 3.1 g/dL — ABNORMAL LOW (ref 3.5–5.0)
Anion gap: 9 (ref 5–15)
BUN: 6 mg/dL (ref 6–20)
BUN: 6 mg/dL (ref 6–20)
CALCIUM: 8.9 mg/dL (ref 8.9–10.3)
CO2: 27 mmol/L (ref 22–32)
CO2: 27 mmol/L (ref 22–32)
CREATININE: 1.41 mg/dL — AB (ref 0.44–1.00)
Calcium: 8.9 mg/dL (ref 8.9–10.3)
Chloride: 99 mmol/L — ABNORMAL LOW (ref 101–111)
Chloride: 99 mmol/L — ABNORMAL LOW (ref 101–111)
Creatinine, Ser: 1.39 mg/dL — ABNORMAL HIGH (ref 0.44–1.00)
GFR calc Af Amer: 46 mL/min — ABNORMAL LOW (ref >60)
GFR, EST AFRICAN AMERICAN: 45 mL/min — AB (ref >60)
GFR, EST NON AFRICAN AMERICAN: 39 mL/min — AB (ref >60)
GFR, EST NON AFRICAN AMERICAN: 39 mL/min — AB (ref >60)
GLUCOSE: 130 mg/dL — AB (ref 65–99)
Glucose, Bld: 132 mg/dL — ABNORMAL HIGH (ref 65–99)
PHOSPHORUS: 1.4 mg/dL — AB (ref 2.5–4.6)
Phosphorus: 1.7 mg/dL — ABNORMAL LOW (ref 2.5–4.6)
Potassium: 4.3 mmol/L (ref 3.5–5.1)
Potassium: 4.3 mmol/L (ref 3.5–5.1)
Sodium: 135 mmol/L (ref 135–145)
Sodium: 133 mmol/L — ABNORMAL LOW (ref 135–145)

## 2015-06-16 LAB — CARBOXYHEMOGLOBIN
Carboxyhemoglobin: 1.3 % (ref 0.5–1.5)
Methemoglobin: 1.1 % (ref 0.0–1.5)
O2 SAT: 55.3 %
TOTAL HEMOGLOBIN: 10.9 g/dL — AB (ref 12.0–16.0)

## 2015-06-16 LAB — HEPARIN LEVEL (UNFRACTIONATED): HEPARIN UNFRACTIONATED: 0.45 [IU]/mL (ref 0.30–0.70)

## 2015-06-16 MED ORDER — "THROMBI-PAD 3""X3"" EX PADS"
1.0000 | MEDICATED_PAD | Freq: Once | CUTANEOUS | Status: DC
  Filled 2015-06-16: qty 1

## 2015-06-16 MED ORDER — GUAIFENESIN-DM 100-10 MG/5ML PO SYRP
5.0000 mL | ORAL_SOLUTION | ORAL | Status: DC | PRN
  Administered 2015-06-16: 5 mL via ORAL
  Filled 2015-06-16: qty 5

## 2015-06-16 NOTE — Progress Notes (Signed)
Advanced Heart Failure Rounding Note   Subjective:    Admitted from HF clinic with increased dyspnea and volume overload.  On 6/10 underwent swan and IABP placement for persistent cardiogenic shock with co-ox in 40s and worsening renal failure. Now on CVVHD (started on 6/11) Remains on  dobutamine 5 and levophed 10 .   Remains on IABP 1:1. CO-OX today 55%.  Weight down 4 pounds. Anuric.   Denies SOB. Feels tired   Swan numbers: CVP 11 PA 39/23 PCWP unable to wedge   Echo LVEF 10% RV moderately HK. Severe MR/TR  Objective:   Weight Range:  Vital Signs:   Temp:  [97.2 F (36.2 C)-98.8 F (37.1 C)] 98.6 F (37 C) (06/16 0600) Pulse Rate:  [99-108] 108 (06/16 0400) Resp:  [13-30] 19 (06/16 0600) BP: (102-139)/(54-97) 121/81 mmHg (06/16 0600) SpO2:  [55 %-100 %] 100 % (06/16 0600) Weight:  [161 lb 2.5 oz (73.1 kg)] 161 lb 2.5 oz (73.1 kg) (06/16 0500) Last BM Date: 06/14/15  Weight change: Filed Weights   06/14/15 0500 06/15/15 0400 06/16/15 0500  Weight: 172 lb 6.4 oz (78.2 kg) 165 lb 9.1 oz (75.1 kg) 161 lb 2.5 oz (73.1 kg)    Intake/Output:   Intake/Output Summary (Last 24 hours) at 06/16/15 0717 Last data filed at 06/16/15 0600  Gross per 24 hour  Intake 1649.14 ml  Output   4510 ml  Net -2860.86 ml     PHYSICAL EXAM: General: Lying flat in bed. weak HEENT: normal Neck: supple. Marland Kitchen RIJ swan Carotids 2+ bilaterally; no bruits. No lymphadenopathy or thryomegaly appreciated. Cor: PMI normal. Regular No rubs,2/6 MR/TR +S3 Lungs: clear Abdomen: soft, tender, nondistended. No hepatosplenomegaly. No bruits or masses. Good bowel sounds. Extremities: no cyanosis, clubbing, rash, R and LLE trace edema,IABP in left groin. R and L pedal pulse 2+  CVVHD catheter in R groin  Neurro: alert & orientedx3, cranial nerves grossly intact. Moves all 4 extremities w/o difficulty. Affect pleasant          Telemetry: Sinus Tach 100s  Labs: Basic Metabolic  Panel:  Recent Labs Lab 06/11/15 1251  06/12/15 0415  06/13/15 0428  06/14/15 0400 06/14/15 1600 06/15/15 0445 06/15/15 1600 06/16/15 0430  NA  --   < > 134*  < >  --   < > 135 133* 134* 134* 135  K  --   < > 4.2  < >  --   < > 4.3 4.3 4.3 4.4 4.3  CL  --   < > 99*  < >  --   < > 101 100* 101 100* 99*  CO2  --   < > 23  < >  --   < > GLUCOSE  --   < > 121*  < >  --   < > 113* 117* 116* 114* 130*  BUN  --   < > 54*  < >  --   < > CREATININE  --   < > 2.81*  < >  --   < > 1.74* 1.57* 1.53* 1.51* 1.41*  CALCIUM  --   < > 8.5*  < >  --   < > 8.7* 8.6* 8.4* 8.6* 8.9  MG 2.4  --  2.6*  --  2.4  --  2.4  --   --   --   --   PHOS  --   < >  4.5  < >  --   < > 2.2* 2.1* 2.3* 2.0* 1.7*  < > = values in this interval not displayed.  Liver Function Tests:  Recent Labs Lab 06/14/15 0400 06/14/15 1600 06/15/15 0445 06/15/15 1600 06/16/15 0430  ALBUMIN 3.1* 3.1* 3.0* 3.1* 3.1*   No results for input(s): LIPASE, AMYLASE in the last 168 hours. No results for input(s): AMMONIA in the last 168 hours.  CBC:  Recent Labs Lab 06/13/15 0428 06/13/15 1400 06/14/15 0400 06/15/15 0445 06/16/15 0430  WBC 5.9 6.0 6.1 5.4 5.6  HGB 12.1 12.1 11.9* 11.3* 11.1*  HCT 36.6 35.7* 36.0 34.4* 33.6*  MCV 65.0* 66.0* 66.8* 65.9* 67.2*  PLT 70* 71* 60* 52* 61*    Cardiac Enzymes: No results for input(s): CKTOTAL, CKMB, CKMBINDEX, TROPONINI in the last 168 hours.  BNP: BNP (last 3 results)  Recent Labs  03/15/15 2150 03/22/15 1601 06/23/2015 1800  BNP 701.4* 930.9* 1217.7*    ProBNP (last 3 results)  Recent Labs  11/08/14 1800  PROBNP 2681.0*      Other results:  Imaging: Dg Chest Port 1 View  06/15/2015   CLINICAL DATA:  Heart disease  EXAM: PORTABLE CHEST - 1 VIEW  COMPARISON:  06/13/2015  FINDINGS: Cardiac shadow remains enlarged. A defibrillator is again seen. Swan-Ganz catheter remains in the right pulmonary artery. Intra-aortic balloon pump is  noted in the descending aorta. Increasing bilateral pleural effusions are noted. A right jugular central line is seen as well.  IMPRESSION: Increasing bilateral pleural effusions  Tubes and lines as described.   Electronically Signed   By: Alcide Clever M.D.   On: 06/15/2015 12:52     Medications:     Scheduled Medications: . antiseptic oral rinse  7 mL Mouth Rinse BID  . calcitRIOL  0.25 mcg Oral Daily  . DULoxetine  60 mg Oral Daily  . feeding supplement (ENSURE ENLIVE)  237 mL Oral Q24H  . feeding supplement (PRO-STAT 64)  30 mL Oral TID WC  . ferrous sulfate  325 mg Oral TID WC  . gabapentin  200 mg Oral QHS  . pantoprazole  40 mg Oral Daily  . rOPINIRole  2 mg Oral QHS  . sodium chloride  10-40 mL Intracatheter Q12H  . sodium chloride  3 mL Intravenous Q12H  . zolpidem  5 mg Oral QHS    Infusions: . sodium chloride 250 mL (06/16/15 0600)  . DOBUTamine 5 mcg/kg/min (06/15/15 1930)  . heparin 1,150 Units/hr (06/15/15 1930)  . norepinephrine (LEVOPHED) Adult infusion 8 mcg/min (06/15/15 2200)  . dialysis replacement fluid (prismasate) 300 mL/hr at 06/15/15 2346  . dialysis replacement fluid (prismasate) 200 mL/hr at 06/16/15 0013  . dialysate (PRISMASATE) 1,500 mL/hr at 06/16/15 0624    PRN Medications: sodium chloride, acetaminophen, heparin, heparin, nitroGLYCERIN, ondansetron (ZOFRAN) IV, ondansetron, polyethylene glycol, sodium chloride, sodium chloride, sodium chloride, traMADol-acetaminophen   Assessment/Plan    1. 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 CAD S/P PCI with DES to prox RCA and stent to distal RCA in 2012. No s/s of ischemia.  3. PSVT - quiescent. No longer on amio 4. Acute on chronic renal failure, stage IV 5. Hyperkalemia 6. Thrombocytopenia  Remains IABP and dual inotropes dobutamine 5 mcg+ norepi @8mcg  . CO-OX 55%.   Remains anuric.   Platelets low due to IABP 52>61. Marland KitchenECHO showed LV function now much worse  and RV at least moderately HK.   Will optimize volume status with  CVVHD. Once optimized will stop CVVHD. If kidney function doesn't improve sufficiently will need transition to comfort care.     Length of Stay: 9 CLEGG,AMY NP-C  06/16/2015, 7:17 AM Advanced Heart Failure Team Pager [phone] (M-F; 7a - 4p)  Please contact [facility] for night-coverage after hours (4p -7a ) and weekends on [email or web address]  Patient seen and examined with Tonye Becket, NP. We discussed all aspects of the encounter. I agree with the assessment and plan as stated above.   Volume status and hemodynamics much improved. Unfortunately remains anuric. Will continue CVVHD one more day then suspend. Can get Ernestine Conrad out. Continue hemodynamic support.  If kidney function doesn't improve sufficiently after CVVHD suspended will need transition to comfort care.  The patient is critically ill with multiple organ systems failure and requires high complexity decision making for assessment and support, frequent evaluation and titration of therapies, application of advanced monitoring technologies and extensive interpretation of multiple databases.   Critical Care Time devoted to patient care services described in this note is 35 Minutes.  Tharon Kitch,MD 9:14 AM

## 2015-06-16 NOTE — Progress Notes (Signed)
ANTICOAGULATION CONSULT NOTE  Pharmacy Consult for Heparin  Indication: IABP  Allergies  Allergen Reactions  . Aspirin Shortness Of Breath and Other (See Comments)    Asthma symptoms  . Brilinta [Ticagrelor] Shortness Of Breath and Other (See Comments)    Gout   . Percocet [Oxycodone-Acetaminophen] Nausea And Vomiting    Patient can tolerate acetaminophen solely  . Darvon Nausea And Vomiting  . Diovan [Valsartan] Other (See Comments) and Nausea Only    unknown  . Imitrex [Sumatriptan Base] Other (See Comments)    Asthma symptoms, shortness of breath  . Lactose Intolerance (Gi)   . Licorice Flavor [Artificial Licorice Flavor]     Black licorice induces asthma   . Nsaids Nausea And Vomiting and Other (See Comments)    GI upset   . Tape Other (See Comments)    Tears skin off.  Paper tape only please.    Patient Measurements: Height: 5' (152.4 cm) Weight: 161 lb 2.5 oz (73.1 kg) IBW/kg (Calculated) : 45.5 Heparin Dosing Weight: 66kg  Vital Signs: Temp: 98.6 F (37 C) (06/16 0700) Temp Source: Core (Comment) (06/16 0400) BP: 123/80 mmHg (06/16 0700) Pulse Rate: 108 (06/16 0400)  Labs:  Recent Labs  06/14/15 0400  06/15/15 0445 06/15/15 1400 06/15/15 1600 06/16/15 0430  HGB 11.9*  --  11.3*  --   --  11.1*  HCT 36.0  --  34.4*  --   --  33.6*  PLT 60*  --  52*  --   --  61*  HEPARINUNFRC 0.35  --  0.58 0.48  --  0.45  CREATININE 1.74*  < > 1.53*  --  1.51* 1.41*  < > = values in this interval not displayed.  Estimated Creatinine Clearance: 36.4 mL/min (by C-G formula based on Cr of 1.41).  Assessment: 63yof with Hx HF on milrinone PTA.  S/p RHC on milrinone and IABP.    Heparin drip 1150 units/hr heparin level therapeutic.  H/h stable PLTC low on IABP, but fairly stable.  Goal of Therapy:  Heparin level 0.2-0.5 units/ml  Monitor platelets by anticoagulation protocol: Yes   Plan:  Continue IV heparin at 1150 units/hr. Daily heparin level and  CBC.  Tad Moore, BCPS  Clinical Pharmacist Pager [phone]  06/16/2015 7:36 AM

## 2015-06-16 NOTE — Procedures (Signed)
Admit: 06/20/2015 LOS: 9  83F w/ biventricular cardiogenic shock, BL SCr 2.6, progressive oliguria, with IABG+Dobutamine+NE  Current CRRT Prescription: Start Date: 06/11/15 Catheter: R Femoral BFR: 100 Pre Blood Pump: 300 4K DFR: 1500 4K Replacement Rate: 200 4K Goal UF: net negative 50-142mL/hr Anticoagulation: systemic heparin Clotting: makes in 72h  S: No new events Anuric Tolerating more aggressive UF  O: 06/15 0701 - 06/16 0700 In: 1694 [P.O.:585; I.V.:1109] Out: 4667 [Urine:21]  Filed Weights   06/14/15 0500 06/15/15 0400 06/16/15 0500  Weight: 78.2 kg (172 lb 6.4 oz) 75.1 kg (165 lb 9.1 oz) 73.1 kg (161 lb 2.5 oz)     Recent Labs Lab 06/15/15 0445 06/15/15 1600 06/16/15 0430  NA 134* 134* 135  K 4.3 4.4 4.3  CL 101 100* 99*  CO2 26 25 27   GLUCOSE 116* 114* 130*  BUN 8 7 6   CREATININE 1.53* 1.51* 1.41*  CALCIUM 8.4* 8.6* 8.9  PHOS 2.3* 2.0* 1.7*    Recent Labs Lab 06/14/15 0400 06/15/15 0445 06/16/15 0430  WBC 6.1 5.4 5.6  HGB 11.9* 11.3* 11.1*  HCT 36.0 34.4* 33.6*  MCV 66.8* 65.9* 67.2*  PLT 60* 52* 61*    Scheduled Meds: . antiseptic oral rinse  7 mL Mouth Rinse BID  . calcitRIOL  0.25 mcg Oral Daily  . DULoxetine  60 mg Oral Daily  . feeding supplement (ENSURE ENLIVE)  237 mL Oral Q24H  . feeding supplement (PRO-STAT 64)  30 mL Oral TID WC  . ferrous sulfate  325 mg Oral TID WC  . gabapentin  200 mg Oral QHS  . pantoprazole  40 mg Oral Daily  . rOPINIRole  2 mg Oral QHS  . sodium chloride  10-40 mL Intracatheter Q12H  . sodium chloride  3 mL Intravenous Q12H  . zolpidem  5 mg Oral QHS   Continuous Infusions: . sodium chloride 250 mL (06/16/15 0600)  . DOBUTamine 5 mcg/kg/min (06/15/15 1930)  . heparin 1,150 Units/hr (06/15/15 1930)  . norepinephrine (LEVOPHED) Adult infusion 8 mcg/min (06/15/15 2200)  . dialysis replacement fluid (prismasate) 300 mL/hr at 06/15/15 2346  . dialysis replacement fluid (prismasate) 200 mL/hr at  06/16/15 0013  . dialysate (PRISMASATE) 1,500 mL/hr at 06/16/15 0624   PRN Meds:.sodium chloride, acetaminophen, heparin, heparin, nitroGLYCERIN, ondansetron (ZOFRAN) IV, ondansetron, polyethylene glycol, sodium chloride, sodium chloride, sodium chloride, traMADol-acetaminophen  ABG    Component Value Date/Time   PHART 7.369 04/12/2014 0811   PCO2ART 38.6 04/12/2014 0811   PO2ART 62.0* 04/12/2014 0811   HCO3 19.2* 06/28/2015 1048   TCO2 20 06/05/2015 1048   ACIDBASEDEF 6.0* 06/06/2015 1048   O2SAT 55.3 06/16/2015 0440    A/P  1. Dialysis dependent anuric AoCKD 1. BL SC4 mid 2s 2. Start CRRT 6/11 for Hypervolemia and Progressive low UOP 3. Cont at current CRRT settings and goal UF 4. Agree with attempting to obtain euvolemia and then stop HD; anuria is very ominous 5. OK with foley removal 6. Metabolically doing well, watch Phos 2. Biventricular Systolic Cardiogenic Shock On Dobutamine + NE + IABP 1. Per cardiology 2. Cont UF 3. Hx/o CAD and PCI  Sabra Heck, MD [facility] pgr [phone]

## 2015-06-17 ENCOUNTER — Inpatient Hospital Stay (HOSPITAL_COMMUNITY): Payer: BLUE CROSS/BLUE SHIELD

## 2015-06-17 LAB — RENAL FUNCTION PANEL
ALBUMIN: 3.2 g/dL — AB (ref 3.5–5.0)
ANION GAP: 7 (ref 5–15)
Albumin: 2.3 g/dL — ABNORMAL LOW (ref 3.5–5.0)
Anion gap: 6 (ref 5–15)
BUN: 6 mg/dL (ref 6–20)
BUN: 5 mg/dL — ABNORMAL LOW (ref 6–20)
CALCIUM: 9.2 mg/dL (ref 8.9–10.3)
CHLORIDE: 100 mmol/L — AB (ref 101–111)
CHLORIDE: 103 mmol/L (ref 101–111)
CO2: 27 mmol/L (ref 22–32)
CO2: 21 mmol/L — AB (ref 22–32)
CREATININE: 1.46 mg/dL — AB (ref 0.44–1.00)
CREATININE: 1.22 mg/dL — AB (ref 0.44–1.00)
Calcium: 9.2 mg/dL (ref 8.9–10.3)
GFR calc Af Amer: 53 mL/min — ABNORMAL LOW (ref >60)
GFR calc non Af Amer: 46 mL/min — ABNORMAL LOW (ref >60)
GFR, EST AFRICAN AMERICAN: 43 mL/min — AB (ref >60)
GFR, EST NON AFRICAN AMERICAN: 37 mL/min — AB (ref >60)
GLUCOSE: 364 mg/dL — AB (ref 65–99)
Glucose, Bld: 127 mg/dL — ABNORMAL HIGH (ref 65–99)
PHOSPHORUS: 1.9 mg/dL — AB (ref 2.5–4.6)
POTASSIUM: 4.3 mmol/L (ref 3.5–5.1)
Phosphorus: 1.1 mg/dL — ABNORMAL LOW (ref 2.5–4.6)
Potassium: 3.3 mmol/L — ABNORMAL LOW (ref 3.5–5.1)
SODIUM: 134 mmol/L — AB (ref 135–145)
Sodium: 130 mmol/L — ABNORMAL LOW (ref 135–145)

## 2015-06-17 LAB — CARBOXYHEMOGLOBIN
Carboxyhemoglobin: 0.9 % (ref 0.5–1.5)
METHEMOGLOBIN: 0.9 % (ref 0.0–1.5)
O2 Saturation: 72 %
TOTAL HEMOGLOBIN: 11.7 g/dL — AB (ref 12.0–16.0)

## 2015-06-17 LAB — CBC
HCT: 35.2 % — ABNORMAL LOW (ref 36.0–46.0)
Hemoglobin: 11.3 g/dL — ABNORMAL LOW (ref 12.0–15.0)
MCH: 21.6 pg — AB (ref 26.0–34.0)
MCHC: 32.1 g/dL (ref 30.0–36.0)
MCV: 67.4 fL — AB (ref 78.0–100.0)
Platelets: 68 10*3/uL — ABNORMAL LOW (ref 150–400)
RBC: 5.22 MIL/uL — AB (ref 3.87–5.11)
RDW: 21 % — ABNORMAL HIGH (ref 11.5–15.5)
WBC: 5 10*3/uL (ref 4.0–10.5)

## 2015-06-17 LAB — HEPARIN LEVEL (UNFRACTIONATED)
Heparin Unfractionated: 1.07 IU/mL — ABNORMAL HIGH (ref 0.30–0.70)
Heparin Unfractionated: 0.62 IU/mL (ref 0.30–0.70)
Heparin Unfractionated: 0.71 IU/mL — ABNORMAL HIGH (ref 0.30–0.70)

## 2015-06-17 MED ORDER — ENSURE ENLIVE PO LIQD
237.0000 mL | Freq: Two times a day (BID) | ORAL | Status: DC
  Administered 2015-06-18 – 2015-07-02 (×27): 237 mL via ORAL

## 2015-06-17 MED ORDER — POTASSIUM CHLORIDE CRYS ER 20 MEQ PO TBCR
40.0000 meq | EXTENDED_RELEASE_TABLET | Freq: Once | ORAL | Status: AC
  Administered 2015-06-17: 40 meq via ORAL
  Filled 2015-06-17: qty 2

## 2015-06-17 NOTE — Progress Notes (Signed)
ANTICOAGULATION CONSULT NOTE  Pharmacy Consult for Heparin  Indication: IABP  Allergies  Allergen Reactions  . Aspirin Shortness Of Breath and Other (See Comments)    Asthma symptoms  . Brilinta [Ticagrelor] Shortness Of Breath and Other (See Comments)    Gout   . Percocet [Oxycodone-Acetaminophen] Nausea And Vomiting    Patient can tolerate acetaminophen solely  . Darvon Nausea And Vomiting  . Diovan [Valsartan] Other (See Comments) and Nausea Only    unknown  . Imitrex [Sumatriptan Base] Other (See Comments)    Asthma symptoms, shortness of breath  . Lactose Intolerance (Gi)   . Licorice Flavor [Artificial Licorice Flavor]     Black licorice induces asthma   . Nsaids Nausea And Vomiting and Other (See Comments)    GI upset   . Tape Other (See Comments)    Tears skin off.  Paper tape only please.    Patient Measurements: Height: 5' (152.4 cm) Weight: 152 lb 8.9 oz (69.2 kg) IBW/kg (Calculated) : 45.5 Heparin Dosing Weight: 66kg  Vital Signs: Temp: 98 F (36.7 C) (06/17 0400) Temp Source: Oral (06/17 0400) BP: 115/75 mmHg (06/17 0700)  Labs:  Recent Labs  06/15/15 0445  06/16/15 0430 06/16/15 1600 06/17/15 0049 06/17/15 0419 06/17/15 0420 06/17/15 0505  HGB 11.3*  --  11.1*  --   --  11.3*  --   --   HCT 34.4*  --  33.6*  --   --  35.2*  --   --   PLT 52*  --  61*  --   --  68*  --   --   HEPARINUNFRC 0.58  < > 0.45  --  1.07*  --   --  0.62  CREATININE 1.53*  < > 1.41* 1.39*  --   --  1.46*  --   < > = values in this interval not displayed.  Estimated Creatinine Clearance: 34.2 mL/min (by C-G formula based on Cr of 1.46).  Assessment: 63yof with Hx HF on milrinone PTA.  S/p RHC on milrinone and IABP.    Heparin drip 1150 units/hr heparin level slightly above goal for IABP.  H/h stable PLTC low on IABP, but fairly stable.  Goal of Therapy:  Heparin level 0.2-0.5 units/ml  Monitor platelets by anticoagulation protocol: Yes   Plan:  Decrease IV  heparin to 1050 units/hr. Recheck heparin level in 8 hrs. Daily heparin level and CBC.  Tad Moore, BCPS  Clinical Pharmacist Pager [phone]  06/17/2015 7:12 AM

## 2015-06-17 NOTE — Procedures (Signed)
Admit: 06/29/2015 LOS: 10  2F w/ biventricular cardiogenic shock, BL SCr 2.6, progressive oliguria, with IABG+Dobutamine+NE  Current CRRT Prescription: Start Date: 06/11/15 Catheter: R Femoral BFR: 100 Pre Blood Pump: 300 4K DFR: 1500 4K Replacement Rate: 200 4K Goal UF: net negative 50-130mL/hr Anticoagulation: systemic heparin and TCP Clotting: not an issue  S: No new events Anuric Tolerating more aggressive UF  O: 06/16 0701 - 06/17 0700 In: 911.8 [P.O.:150; I.V.:761.8] Out: 4346   Filed Weights   06/15/15 0400 06/16/15 0500 06/17/15 0500  Weight: 75.1 kg (165 lb 9.1 oz) 73.1 kg (161 lb 2.5 oz) 69.2 kg (152 lb 8.9 oz)     Recent Labs Lab 06/16/15 0430 06/16/15 1600 06/17/15 0420  NA 135 133* 134*  K 4.3 4.3 4.3  CL 99* 99* 100*  CO2 27 27 27   GLUCOSE 130* 132* 127*  BUN 6 6 6   CREATININE 1.41* 1.39* 1.46*  CALCIUM 8.9 8.9 9.2  PHOS 1.7* 1.4* 1.9*    Recent Labs Lab 06/15/15 0445 06/16/15 0430 06/17/15 0419  WBC 5.4 5.6 5.0  HGB 11.3* 11.1* 11.3*  HCT 34.4* 33.6* 35.2*  MCV 65.9* 67.2* 67.4*  PLT 52* 61* 68*    Scheduled Meds: . antiseptic oral rinse  7 mL Mouth Rinse BID  . calcitRIOL  0.25 mcg Oral Daily  . DULoxetine  60 mg Oral Daily  . feeding supplement (ENSURE ENLIVE)  237 mL Oral Q24H  . feeding supplement (PRO-STAT 64)  30 mL Oral TID WC  . ferrous sulfate  325 mg Oral TID WC  . gabapentin  200 mg Oral QHS  . pantoprazole  40 mg Oral Daily  . rOPINIRole  2 mg Oral QHS  . sodium chloride  10-40 mL Intracatheter Q12H  . sodium chloride  3 mL Intravenous Q12H  . THROMBI-PAD  1 each Topical Once  . zolpidem  5 mg Oral QHS   Continuous Infusions: . sodium chloride 250 mL (06/16/15 0600)  . DOBUTamine 5 mcg/kg/min (06/16/15 1525)  . heparin 1,050 Units/hr (06/17/15 0730)  . norepinephrine (LEVOPHED) Adult infusion 10 mcg/min (06/16/15 2209)  . dialysis replacement fluid (prismasate) 300 mL/hr at 06/17/15 0206  . dialysis replacement  fluid (prismasate) 200 mL/hr at 06/16/15 0013  . dialysate (PRISMASATE) 1,500 mL/hr at 06/17/15 0620   PRN Meds:.sodium chloride, acetaminophen, guaiFENesin-dextromethorphan, heparin, heparin, nitroGLYCERIN, ondansetron (ZOFRAN) IV, ondansetron, polyethylene glycol, sodium chloride, sodium chloride, sodium chloride, traMADol-acetaminophen  ABG    Component Value Date/Time   PHART 7.369 04/12/2014 0811   PCO2ART 38.6 04/12/2014 0811   PO2ART 62.0* 04/12/2014 0811   HCO3 19.2* June 14, 2015 1048   TCO2 20 06-14-15 1048   ACIDBASEDEF 6.0* 2015/06/14 1048   O2SAT 72.0 06/17/2015 0420    A/P  1. Dialysis dependent anuric AoCKD 1. BL SC4 mid 2s 2. Started CRRT 6/11 for Hypervolemia and Anuric AKI 3. Cont at current CRRT settings and goal UF for another 24h 4. Agree with attempting to obtain euvolemia and then stop HD; anuria is very ominous 5. OK with foley removal 6. Metabolically doing well, watch Phos 2. Biventricular Systolic Cardiogenic Shock On Dobutamine + NE + IABP 1. Per cardiology 2. Cont UF 3. Hx/o CAD and PCI  Sabra Heck, MD [facility] pgr [phone]

## 2015-06-17 NOTE — Care Management Note (Signed)
Case Management Note  Patient Details  Name: Andrea [name] MRN: [id number] Date of Birth: Mar 29, 1951  Subjective/Objective:             CHF       Action/Plan: Home Health  Expected Discharge Date:                  Expected Discharge Plan:  Home w Home Health Services  In-House Referral:     Discharge planning Services  CM Consult  Post Acute Care Choice:    Choice offered to:     DME Arranged:    DME Agency:     HH Arranged:    HH Agency:     Status of Service:  In process, will continue to follow  Medicare Important Message Given:    Date Medicare IM Given:    Medicare IM give by:    Date Additional Medicare IM Given:    Additional Medicare Important Message give by:     If discussed at Long Length of Stay Meetings, dates discussed:    Additional Comments: Chart reviewed. NCM spoke to to pt and lives at home with wife, [name] and son. Waiting final dc recommendations for home. NCM will continue to follow for dc needs.  Elliot Cousin, RN 06/17/2015, 2:07 PM

## 2015-06-17 NOTE — Progress Notes (Addendum)
Advanced Heart Failure Rounding Note   Subjective:    Admitted from HF clinic with increased dyspnea and volume overload.  On 6/10 underwent swan and IABP placement for persistent cardiogenic shock with co-ox in 40s and worsening renal failure. Now on CVVHD (started on 6/11) Remains on  dobutamine 5 and levophed 10 .   Remains on IABP 1:1 and CVVHD. CO-OX today 72%.  Weight down another 9 pounds (?). Unfortunately remains anuric. Swan out 6/16. CVP 11-12    Echo LVEF 10% RV moderately HK. Severe MR/TR  Objective:   Weight Range:  Vital Signs:   Temp:  [97.4 F (36.3 C)-99 F (37.2 C)] 97.4 F (36.3 C) (06/17 0800) Resp:  [14-36] 14 (06/17 0900) BP: (89-126)/(61-89) 110/69 mmHg (06/17 0900) SpO2:  [95 %-100 %] 100 % (06/17 0900) Weight:  [69.2 kg (152 lb 8.9 oz)] 69.2 kg (152 lb 8.9 oz) (06/17 0500) Last BM Date: 06/16/15  Weight change: Filed Weights   06/15/15 0400 06/16/15 0500 06/17/15 0500  Weight: 75.1 kg (165 lb 9.1 oz) 73.1 kg (161 lb 2.5 oz) 69.2 kg (152 lb 8.9 oz)    Intake/Output:   Intake/Output Summary (Last 24 hours) at 06/17/15 0926 Last data filed at 06/17/15 0900  Gross per 24 hour  Intake  888.3 ml  Output   4328 ml  Net -3439.7 ml     PHYSICAL EXAM: General: Lying flat in bed. weak HEENT: normal Neck: supple. Carotids 2+ bilaterally; no bruits. No lymphadenopathy or thryomegaly appreciated. Cor: PMI normal. Regular No rubs,2/6 MR/TR +S3 Lungs: clear Abdomen: soft, tender, nondistended. No hepatosplenomegaly. No bruits or masses. Good bowel sounds. Extremities: no cyanosis, clubbing, rash, R and LLE trace edema,IABP in left groin. R and L pedal pulse 2+  CVVHD catheter in R groin  Neurro: alert & orientedx3, cranial nerves grossly intact. Moves all 4 extremities w/o difficulty. Affect pleasant          Telemetry: Sinus Tach 100s  Labs: Basic Metabolic Panel:  Recent Labs Lab 06/11/15 1251  06/12/15 0415  06/13/15 0428   06/14/15 0400  06/15/15 0445 06/15/15 1600 06/16/15 0430 06/16/15 1600 06/17/15 0420  NA  --   < > 134*  < >  --   < > 135  < > 134* 134* 135 133* 134*  K  --   < > 4.2  < >  --   < > 4.3  < > 4.3 4.4 4.3 4.3 4.3  CL  --   < > 99*  < >  --   < > 101  < > 101 100* 99* 99* 100*  CO2  --   < > 23  < >  --   < > 26  < > GLUCOSE  --   < > 121*  < >  --   < > 113*  < > 116* 114* 130* 132* 127*  BUN  --   < > 54*  < >  --   < > 14  < > CREATININE  --   < > 2.81*  < >  --   < > 1.74*  < > 1.53* 1.51* 1.41* 1.39* 1.46*  CALCIUM  --   < > 8.5*  < >  --   < > 8.7*  < > 8.4* 8.6* 8.9 8.9 9.2  MG 2.4  --  2.6*  --  2.4  --  2.4  --   --   --   --   --   --  PHOS  --   < > 4.5  < >  --   < > 2.2*  < > 2.3* 2.0* 1.7* 1.4* 1.9*  < > = values in this interval not displayed.  Liver Function Tests:  Recent Labs Lab 06/15/15 0445 06/15/15 1600 06/16/15 0430 06/16/15 1600 06/17/15 0420  ALBUMIN 3.0* 3.1* 3.1* 3.2* 3.2*   No results for input(s): LIPASE, AMYLASE in the last 168 hours. No results for input(s): AMMONIA in the last 168 hours.  CBC:  Recent Labs Lab 06/13/15 1400 06/14/15 0400 06/15/15 0445 06/16/15 0430 06/17/15 0419  WBC 6.0 6.1 5.4 5.6 5.0  HGB 12.1 11.9* 11.3* 11.1* 11.3*  HCT 35.7* 36.0 34.4* 33.6* 35.2*  MCV 66.0* 66.8* 65.9* 67.2* 67.4*  PLT 71* 60* 52* 61* 68*    Cardiac Enzymes: No results for input(s): CKTOTAL, CKMB, CKMBINDEX, TROPONINI in the last 168 hours.  BNP: BNP (last 3 results)  Recent Labs  03/15/15 2150 03/22/15 1601 06/24/2015 1800  BNP 701.4* 930.9* 1217.7*    ProBNP (last 3 results)  Recent Labs  11/08/14 1800  PROBNP 2681.0*      Other results:  Imaging: Dg Chest 1 View  06/16/2015   CLINICAL DATA:  Management of the intra-aortic balloon pump.  EXAM: CHEST  1 VIEW  COMPARISON:  06/15/2015  FINDINGS: Patient is slightly rotated to the right as the lungs are hypoinflated. No significant change in a  right IJ central venous catheter. Left-sided pacemaker are unchanged. Right IJ Swan-Ganz catheter has tip overlying the proximal right lower lobar pulmonary artery unchanged. Intra aortic balloon pump unchanged in position at the approximate T8-9 level over the descending thoracic aorta.  Continued hazy opacification over the perihilar and bibasilar regions which may be due to mild interstitial edema with bilateral effusions/ bibasilar atelectasis. Cannot completely exclude infection. Stable moderate cardiomegaly. Remainder of the exam is unchanged  IMPRESSION: Bilateral perihilar bibasilar opacification without significant change likely mild interstitial edema with bilateral effusions/atelectasis. Cannot completely exclude infection.  Stable moderate cardiomegaly.  Multiple tubes and lines as described unchanged.   Electronically Signed   By: Elberta Fortis M.D.   On: 06/16/2015 10:20   Dg Chest Port 1 View  06/15/2015   CLINICAL DATA:  Heart disease  EXAM: PORTABLE CHEST - 1 VIEW  COMPARISON:  06/13/2015  FINDINGS: Cardiac shadow remains enlarged. A defibrillator is again seen. Swan-Ganz catheter remains in the right pulmonary artery. Intra-aortic balloon pump is noted in the descending aorta. Increasing bilateral pleural effusions are noted. A right jugular central line is seen as well.  IMPRESSION: Increasing bilateral pleural effusions  Tubes and lines as described.   Electronically Signed   By: Alcide Clever M.D.   On: 06/15/2015 12:52     Medications:     Scheduled Medications: . antiseptic oral rinse  7 mL Mouth Rinse BID  . calcitRIOL  0.25 mcg Oral Daily  . DULoxetine  60 mg Oral Daily  . feeding supplement (ENSURE ENLIVE)  237 mL Oral Q24H  . feeding supplement (PRO-STAT 64)  30 mL Oral TID WC  . ferrous sulfate  325 mg Oral TID WC  . gabapentin  200 mg Oral QHS  . pantoprazole  40 mg Oral Daily  . rOPINIRole  2 mg Oral QHS  . sodium chloride  10-40 mL Intracatheter Q12H  . sodium  chloride  3 mL Intravenous Q12H  . THROMBI-PAD  1 each Topical Once  . zolpidem  5 mg Oral QHS  Infusions: . sodium chloride 250 mL (06/16/15 0600)  . DOBUTamine 5 mcg/kg/min (06/16/15 1525)  . heparin 1,050 Units/hr (06/17/15 0730)  . norepinephrine (LEVOPHED) Adult infusion 10 mcg/min (06/16/15 2209)  . dialysis replacement fluid (prismasate) 300 mL/hr at 06/17/15 0206  . dialysis replacement fluid (prismasate) 200 mL/hr at 06/16/15 0013  . dialysate (PRISMASATE) 1,500 mL/hr at 06/17/15 0620    PRN Medications: sodium chloride, acetaminophen, guaiFENesin-dextromethorphan, heparin, heparin, nitroGLYCERIN, ondansetron (ZOFRAN) IV, ondansetron, polyethylene glycol, sodium chloride, sodium chloride, sodium chloride, traMADol-acetaminophen   Assessment/Plan    1. 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 CAD S/P PCI with DES to prox RCA and stent to distal RCA in 2012. No s/s of ischemia.  3. PSVT - quiescent. No longer on amio 4. Acute on chronic renal failure, stage IV 5. Hyperkalemia 6. Thrombocytopenia  Volume status and hemodynamics much improved. Unfortunately remains anuric. Discussed with Dr. Marisue Humble. Will continue CVVHD one more day then suspend. Continue hemodynamic support.  If kidney function doesn't improve sufficiently after CVVHD suspended will need transition to comfort care. With RV dysfunction doubt we will get CVP much less than 10. Would decrease pull rate to 100/hr.   Platelets stable.   IABP appears slightly low on CXR. I repositioned. Will repeat CXR.  The patient is critically ill with multiple organ systems failure and requires high complexity decision making for assessment and support, frequent evaluation and titration of therapies, application of advanced monitoring technologies and extensive interpretation of multiple databases.   Critical Care Time devoted to patient care services described in this note is 35  Minutes.   Length of Stay: 10  [name] MD 06/17/2015, 9:26 AM Advanced Heart Failure Team Pager [phone] (M-F; 7a - 4p)  Please contact [facility] for night-coverage after hours (4p -7a ) and weekends on [email or web address]

## 2015-06-17 NOTE — Progress Notes (Signed)
ANTICOAGULATION CONSULT NOTE  Pharmacy Consult for Heparin  Indication: IABP  Allergies  Allergen Reactions  . Aspirin Shortness Of Breath and Other (See Comments)    Asthma symptoms  . Brilinta [Ticagrelor] Shortness Of Breath and Other (See Comments)    Gout   . Percocet [Oxycodone-Acetaminophen] Nausea And Vomiting    Patient can tolerate acetaminophen solely  . Darvon Nausea And Vomiting  . Diovan [Valsartan] Other (See Comments) and Nausea Only    unknown  . Imitrex [Sumatriptan Base] Other (See Comments)    Asthma symptoms, shortness of breath  . Lactose Intolerance (Gi)   . Licorice Flavor [Artificial Licorice Flavor]     Black licorice induces asthma   . Nsaids Nausea And Vomiting and Other (See Comments)    GI upset   . Tape Other (See Comments)    Tears skin off.  Paper tape only please.    Patient Measurements: Height: 5' (152.4 cm) Weight: 152 lb 8.9 oz (69.2 kg) IBW/kg (Calculated) : 45.5 Heparin Dosing Weight: 66kg  Vital Signs: Temp: 97.4 F (36.3 C) (06/17 1200) Temp Source: Oral (06/17 1600) BP: 112/48 mmHg (06/17 1600)  Labs:  Recent Labs  06/15/15 0445  06/16/15 0430 06/16/15 1600 06/17/15 0049 06/17/15 0419 06/17/15 0420 06/17/15 0505 06/17/15 1618  HGB 11.3*  --  11.1*  --   --  11.3*  --   --   --   HCT 34.4*  --  33.6*  --   --  35.2*  --   --   --   PLT 52*  --  61*  --   --  68*  --   --   --   HEPARINUNFRC 0.58  < > 0.45  --  1.07*  --   --  0.62 0.71*  CREATININE 1.53*  < > 1.41* 1.39*  --   --  1.46*  --   --   < > = values in this interval not displayed.  Estimated Creatinine Clearance: 34.2 mL/min (by C-G formula based on Cr of 1.46).  Assessment: 63yof with Hx HF on milrinone PTA.  S/p RHC on milrinone and IABP.    Heparin drip 1150 units/hr heparin level slightly above goal for IABP.  H/h stable PLTC low on IABP, but fairly stable.  Goal of Therapy:  Heparin level 0.2-0.5 units/ml  Monitor platelets by anticoagulation  protocol: Yes   Plan:  Decrease IV heparin to 850 units/hr. Recheck heparin level in 8 hrs. Daily heparin level and CBC.  Thank you. Okey Regal, PharmD [phone]  06/17/2015 4:55 PM

## 2015-06-17 NOTE — Progress Notes (Signed)
Initial Nutrition Assessment  DOCUMENTATION CODES:  Non-severe (moderate) malnutrition in context of acute illness/injury  INTERVENTION:  Ensure Enlive (each supplement provides 350kcal and 20 grams of protein) BID  NUTRITION DIAGNOSIS:  Malnutrition related to acute illness as evidenced by energy intake < 75% for > 7 days, mild depletion of muscle mass.  GOAL:  Patient will meet greater than or equal to 90% of their needs  MONITOR:  PO intake, Supplement acceptance, Labs, Weight trends, I & O's, Skin  REASON FOR ASSESSMENT:  Malnutrition Screening Tool, Other (Comment) (Status change F/U)    ASSESSMENT: Admitted from HF clinic with increased dyspnea and volume overload. On 6/10 underwent swan and IABP placement for persistent cardiogenic shock with co-ox in 40s and worsening renal failure. Now on CVVHD (started on 6/11).  Weight decreasing but remains anuric. On pressors. Per MD CVVHD one more day then d/c and if renal function does not improve will move to comfort care. Pt has refused prostat, will d/c.  Labs/meds reviewed: Potassium WNL, Magnesium and Phosphorus low   Height:  Ht Readings from Last 1 Encounters:  06/06/2015 5' (1.524 m)    Weight:  Wt Readings from Last 1 Encounters:  06/17/15 152 lb 8.9 oz (69.2 kg)    Ideal Body Weight:  48 kg  Wt Readings from Last 10 Encounters:  06/17/15 152 lb 8.9 oz (69.2 kg)  06/02/2015 174 lb 8 oz (79.153 kg)  05/31/15 179 lb (81.194 kg)  04/13/15 172 lb (78.019 kg)  03/26/15 181 lb 4.8 oz (82.237 kg)  03/15/15 178 lb 8 oz (80.967 kg)  02/03/15 180 lb (81.647 kg)  01/17/15 177 lb 3.2 oz (80.377 kg)  12/13/14 184 lb 7 oz (83.66 kg)  11/15/14 188 lb 12.8 oz (85.639 kg)    BMI:  Body mass index is 29.79 kg/(m^2).  Estimated Nutritional Needs:  Kcal:  1600 - 1800  Protein:  120 - 140 g  Fluid:  per MD  Skin:  Reviewed, no issues  Diet Order:  Diet renal with fluid restriction Fluid restriction:: 1500 mL  Fluid; Room service appropriate?: Yes; Fluid consistency:: Thin  EDUCATION NEEDS:  Education needs addressed   Intake/Output Summary (Last 24 hours) at 06/17/15 1516 Last data filed at 06/17/15 1400  Gross per 24 hour  Intake  959.9 ml  Output   3877 ml  Net -2917.1 ml    Last BM:  6/14  Kendell Bane RD, LDN, CNSC [phone] Pager [phone] After Hours Pager

## 2015-06-18 ENCOUNTER — Inpatient Hospital Stay (HOSPITAL_COMMUNITY): Payer: BLUE CROSS/BLUE SHIELD

## 2015-06-18 LAB — CBC
HCT: 33.2 % — ABNORMAL LOW (ref 36.0–46.0)
Hemoglobin: 11.1 g/dL — ABNORMAL LOW (ref 12.0–15.0)
MCH: 22 pg — ABNORMAL LOW (ref 26.0–34.0)
MCHC: 33.4 g/dL (ref 30.0–36.0)
MCV: 65.7 fL — ABNORMAL LOW (ref 78.0–100.0)
PLATELETS: 78 10*3/uL — AB (ref 150–400)
RBC: 5.05 MIL/uL (ref 3.87–5.11)
RDW: 21 % — ABNORMAL HIGH (ref 11.5–15.5)
WBC: 5 10*3/uL (ref 4.0–10.5)

## 2015-06-18 LAB — CARBOXYHEMOGLOBIN
Carboxyhemoglobin: 1.1 % (ref 0.5–1.5)
Methemoglobin: 0.9 % (ref 0.0–1.5)
O2 SAT: 61.6 %
Total hemoglobin: 11.4 g/dL — ABNORMAL LOW (ref 12.0–16.0)

## 2015-06-18 LAB — HEPARIN LEVEL (UNFRACTIONATED)
HEPARIN UNFRACTIONATED: 0.27 [IU]/mL — AB (ref 0.30–0.70)
Heparin Unfractionated: 0.17 IU/mL — ABNORMAL LOW (ref 0.30–0.70)
Heparin Unfractionated: 0.18 IU/mL — ABNORMAL LOW (ref 0.30–0.70)
Heparin Unfractionated: 0.27 IU/mL — ABNORMAL LOW (ref 0.30–0.70)

## 2015-06-18 LAB — RENAL FUNCTION PANEL
ALBUMIN: 3.2 g/dL — AB (ref 3.5–5.0)
ANION GAP: 10 (ref 5–15)
ANION GAP: 6 (ref 5–15)
Albumin: 3.1 g/dL — ABNORMAL LOW (ref 3.5–5.0)
BUN: 5 mg/dL — ABNORMAL LOW (ref 6–20)
BUN: 10 mg/dL (ref 6–20)
CALCIUM: 9.5 mg/dL (ref 8.9–10.3)
CHLORIDE: 100 mmol/L — AB (ref 101–111)
CO2: 25 mmol/L (ref 22–32)
CO2: 26 mmol/L (ref 22–32)
CREATININE: 1.4 mg/dL — AB (ref 0.44–1.00)
Calcium: 9 mg/dL (ref 8.9–10.3)
Chloride: 99 mmol/L — ABNORMAL LOW (ref 101–111)
Creatinine, Ser: 1.93 mg/dL — ABNORMAL HIGH (ref 0.44–1.00)
GFR calc Af Amer: 45 mL/min — ABNORMAL LOW (ref >60)
GFR calc Af Amer: 31 mL/min — ABNORMAL LOW (ref >60)
GFR, EST NON AFRICAN AMERICAN: 39 mL/min — AB (ref >60)
GFR, EST NON AFRICAN AMERICAN: 26 mL/min — AB (ref >60)
GLUCOSE: 179 mg/dL — AB (ref 65–99)
Glucose, Bld: 121 mg/dL — ABNORMAL HIGH (ref 65–99)
Phosphorus: 1.7 mg/dL — ABNORMAL LOW (ref 2.5–4.6)
Phosphorus: 1.6 mg/dL — ABNORMAL LOW (ref 2.5–4.6)
Potassium: 4.7 mmol/L (ref 3.5–5.1)
Potassium: 4.9 mmol/L (ref 3.5–5.1)
Sodium: 134 mmol/L — ABNORMAL LOW (ref 135–145)
Sodium: 132 mmol/L — ABNORMAL LOW (ref 135–145)

## 2015-06-18 MED ORDER — OXYBUTYNIN CHLORIDE 5 MG PO TABS
5.0000 mg | ORAL_TABLET | Freq: Three times a day (TID) | ORAL | Status: DC | PRN
  Administered 2015-06-18: 5 mg via ORAL
  Filled 2015-06-18 (×2): qty 1

## 2015-06-18 NOTE — Progress Notes (Signed)
Order to discontinue CRRT; but was unable to return blood due to inability to access the STOP icon. The [employer] company called and spoke with representative who tried to assist by having RN turn the machine off and then back on to see if it will help but to no avail. The icon button that  was necessary was still inaccessible. [employer] notified, someone to come and assess machine on Monday. Machine with filter on covered with red biohazard bag and labelled placed in soiled utility room.  HD ports flushed with NS and instilled with 2.61ml (1.4 ml each) of Heparin and capped.   14Fr. Foley placed per order with return of dark bloody urine. Pt complaining of spasms while feeling the need to urinate., Dr Gala Romney notified while on floor. New order received, awaiting med. Will cont to monitor.

## 2015-06-18 NOTE — Procedures (Signed)
Admit: 06/18/2015 LOS: 11  62F w/ biventricular cardiogenic shock, BL SCr 2.6, progressive oliguria, with IABP+Dobutamine+NE  Current CRRT Prescription: Start Date: 06/11/15 Catheter: R Femoral BFR: 100 Pre Blood Pump: 300 4K DFR: 1500 4K Replacement Rate: 200 4K Goal UF: net negative 50-146mL/hr Anticoagulation: systemic heparin and TCP Clotting: not an issue  S: No new events Perhaps inc voiding frequency and amount? CVP < 10  O: 06/17 0701 - 06/18 0700 In: 1342.9 [P.O.:380; I.V.:962.9] Out: 3410   Filed Weights   06/16/15 0500 06/17/15 0500 06/18/15 0409  Weight: 73.1 kg (161 lb 2.5 oz) 69.2 kg (152 lb 8.9 oz) 67.3 kg (148 lb 5.9 oz)     Recent Labs Lab 06/17/15 0420 06/17/15 1613 06/18/15 0503  NA 134* 130* 134*  K 4.3 3.3* 4.7  CL 100* 103 99*  CO2 27 21* 25  GLUCOSE 127* 364* 121*  BUN 6 5* 5*  CREATININE 1.46* 1.22* 1.40*  CALCIUM 9.2 9.2 9.0  PHOS 1.9* 1.1* 1.7*    Recent Labs Lab 06/16/15 0430 06/17/15 0419 06/18/15 0503  WBC 5.6 5.0 5.0  HGB 11.1* 11.3* 11.1*  HCT 33.6* 35.2* 33.2*  MCV 67.2* 67.4* 65.7*  PLT 61* 68* 78*    Scheduled Meds: . antiseptic oral rinse  7 mL Mouth Rinse BID  . calcitRIOL  0.25 mcg Oral Daily  . DULoxetine  60 mg Oral Daily  . feeding supplement (ENSURE ENLIVE)  237 mL Oral BID  . ferrous sulfate  325 mg Oral TID WC  . gabapentin  200 mg Oral QHS  . pantoprazole  40 mg Oral Daily  . rOPINIRole  2 mg Oral QHS  . sodium chloride  10-40 mL Intracatheter Q12H  . sodium chloride  3 mL Intravenous Q12H  . THROMBI-PAD  1 each Topical Once  . zolpidem  5 mg Oral QHS   Continuous Infusions: . sodium chloride 250 mL (06/16/15 0600)  . DOBUTamine 5 mcg/kg/min (06/18/15 0800)  . heparin 900 Units/hr (06/18/15 0800)  . norepinephrine (LEVOPHED) Adult infusion 10 mcg/min (06/18/15 0800)  . dialysis replacement fluid (prismasate) 300 mL/hr at 06/18/15 0350  . dialysis replacement fluid (prismasate) 200 mL/hr at  06/18/15 0645  . dialysate (PRISMASATE) 1,500 mL/hr at 06/18/15 0400   PRN Meds:.sodium chloride, acetaminophen, guaiFENesin-dextromethorphan, heparin, heparin, nitroGLYCERIN, ondansetron (ZOFRAN) IV, ondansetron, polyethylene glycol, sodium chloride, sodium chloride, sodium chloride, traMADol-acetaminophen  ABG    Component Value Date/Time   PHART 7.369 04/12/2014 0811   PCO2ART 38.6 04/12/2014 0811   PO2ART 62.0* 04/12/2014 0811   HCO3 19.2* 06/30/2015 1048   TCO2 20 06/06/2015 1048   ACIDBASEDEF 6.0* 06/09/2015 1048   O2SAT 61.6 06/18/2015 0410    A/P  1. Dialysis dependent anuric AoCKD 1. BL SC4 mid 2s 2. Started CRRT 6/11 for Hypervolemia and Anuric AKI 3. Metabolically doing well, watch Phos 4. Seems to have come to targets with CRRT; ok with DC of therapy today after AHF evaluates 5. W/ inc voiding, replace foley to get accurate I/Os 2. Biventricular Systolic Cardiogenic Shock On Dobutamine + NE + IABP 1. Per cardiology 2. Cont UF 3. Hx/o CAD and PCI  Sabra Heck, MD [facility] pgr [phone]

## 2015-06-18 NOTE — Progress Notes (Signed)
ANTICOAGULATION CONSULT NOTE - Follow Up Consult  Pharmacy Consult for heparin Indication: IABP   Labs:  Recent Labs  06/16/15 0430  06/17/15 0419 06/17/15 0420  06/17/15 1613 06/17/15 1618 06/17/15 2340 06/18/15 0503  HGB 11.1*  --  11.3*  --   --   --   --   --   --   HCT 33.6*  --  35.2*  --   --   --   --   --   --   PLT 61*  --  68*  --   --   --   --   --   --   HEPARINUNFRC 0.45  < >  --   --   < >  --  0.71* 0.27* 0.18*  CREATININE 1.41*  < >  --  1.46*  --  1.22*  --   --  1.40*  < > = values in this interval not displayed.    Assessment: 64yo female now subtherapeutic on heparin after one level at goal; CBC still in process; RN reports previous high levels were drawn from central line.  Goal of Therapy:  Heparin level 0.2-0.5 units/ml   Plan:  Will increase heparin gtt slightly to 900 units/hr and check level in 8hr.  Vernard Gambles, PharmD, BCPS  06/18/2015,5:40 AM

## 2015-06-18 NOTE — Progress Notes (Signed)
ANTICOAGULATION CONSULT NOTE  Pharmacy Consult for Heparin  Indication: IABP  Allergies  Allergen Reactions  . Aspirin Shortness Of Breath and Other (See Comments)    Asthma symptoms  . Brilinta [Ticagrelor] Shortness Of Breath and Other (See Comments)    Gout   . Percocet [Oxycodone-Acetaminophen] Nausea And Vomiting    Patient can tolerate acetaminophen solely  . Darvon Nausea And Vomiting  . Diovan [Valsartan] Other (See Comments) and Nausea Only    unknown  . Imitrex [Sumatriptan Base] Other (See Comments)    Asthma symptoms, shortness of breath  . Lactose Intolerance (Gi)   . Licorice Flavor [Artificial Licorice Flavor]     Black licorice induces asthma   . Nsaids Nausea And Vomiting and Other (See Comments)    GI upset   . Tape Other (See Comments)    Tears skin off.  Paper tape only please.    Patient Measurements: Height: 5' (152.4 cm) Weight: 152 lb 8.9 oz (69.2 kg) IBW/kg (Calculated) : 45.5 Heparin Dosing Weight: 66kg  Vital Signs: Temp: 98.2 F (36.8 C) (06/18 0000) Temp Source: Oral (06/18 0000) BP: 114/70 mmHg (06/18 0000)  Labs:  Recent Labs  06/15/15 0445  06/16/15 0430 06/16/15 1600  06/17/15 0419 06/17/15 0420 06/17/15 0505 06/17/15 1613 06/17/15 1618 06/17/15 2340  HGB 11.3*  --  11.1*  --   --  11.3*  --   --   --   --   --   HCT 34.4*  --  33.6*  --   --  35.2*  --   --   --   --   --   PLT 52*  --  61*  --   --  68*  --   --   --   --   --   HEPARINUNFRC 0.58  < > 0.45  --   < >  --   --  0.62  --  0.71* 0.27*  CREATININE 1.53*  < > 1.41* 1.39*  --   --  1.46*  --  1.22*  --   --   < > = values in this interval not displayed.  Estimated Creatinine Clearance: 41 mL/min (by C-G formula based on Cr of 1.22).  Assessment: 63yof with Hx HF on milrinone PTA.  S/p RHC on milrinone and IABP.    Heparin drip 850 units/hr. Heparin level therapeutic (0.27). No bleeding noted.  Goal of Therapy:  Heparin level 0.2-0.5 units/ml  Monitor  platelets by anticoagulation protocol: Yes   Plan:  Continue IV heparin to 850 units/hr. Daily heparin level and CBC.  Christoper Fabian, PharmD, BCPS Clinical pharmacist, pager [phone]  06/18/2015 12:57 AM

## 2015-06-18 NOTE — Progress Notes (Signed)
HD ports flushed with total 2.8 ml (1.4 ml each).

## 2015-06-18 NOTE — Progress Notes (Signed)
ANTICOAGULATION CONSULT NOTE  Pharmacy Consult for Heparin  Indication: IABP  Allergies  Allergen Reactions  . Aspirin Shortness Of Breath and Other (See Comments)    Asthma symptoms  . Brilinta [Ticagrelor] Shortness Of Breath and Other (See Comments)    Gout   . Percocet [Oxycodone-Acetaminophen] Nausea And Vomiting    Patient can tolerate acetaminophen solely  . Darvon Nausea And Vomiting  . Diovan [Valsartan] Other (See Comments) and Nausea Only    unknown  . Imitrex [Sumatriptan Base] Other (See Comments)    Asthma symptoms, shortness of breath  . Lactose Intolerance (Gi)   . Licorice Flavor [Artificial Licorice Flavor]     Black licorice induces asthma   . Nsaids Nausea And Vomiting and Other (See Comments)    GI upset   . Tape Other (See Comments)    Tears skin off.  Paper tape only please.    Patient Measurements: Height: 5' (152.4 cm) Weight: 148 lb 5.9 oz (67.3 kg) IBW/kg (Calculated) : 45.5 Heparin Dosing Weight: 66kg  Vital Signs: Temp: 98.3 F (36.8 C) (06/18 2000) Temp Source: Oral (06/18 2000) BP: 95/61 mmHg (06/18 2100)  Labs:  Recent Labs  06/16/15 0430  06/17/15 0419  06/17/15 1613  06/18/15 0503 06/18/15 1420 06/18/15 1830  HGB 11.1*  --  11.3*  --   --   --  11.1*  --   --   HCT 33.6*  --  35.2*  --   --   --  33.2*  --   --   PLT 61*  --  68*  --   --   --  78*  --   --   HEPARINUNFRC 0.45  < >  --   < >  --   < > 0.18* 0.27* 0.17*  CREATININE 1.41*  < >  --   < > 1.22*  --  1.40* 1.93*  --   < > = values in this interval not displayed.  Estimated Creatinine Clearance: 25.5 mL/min (by C-G formula based on Cr of 1.93).  Assessment: 43 YOF who continues on heparin for anticoagulation while IABP in place with a slightly SUBtherapeutic heparin level this evening (HL 0.17, goal of 0.2-0.5). No repeat CBC. No overt s/sx of bleeding noted or issues with the drip noted per RN.   Goal of Therapy:  Heparin level 0.2-0.5 units/ml  Monitor  platelets by anticoagulation protocol: Yes   Plan:  1. Increase heparin to 1000 units/hr (10 ml/hr) 2. Will continue to monitor for any signs/symptoms of bleeding and will follow up with heparin level in 8 hours   Georgina Pillion, PharmD, BCPS Clinical Pharmacist Pager: [phone] 06/18/2015 9:50 PM

## 2015-06-18 NOTE — Progress Notes (Signed)
ANTICOAGULATION CONSULT NOTE  Pharmacy Consult for Heparin  Indication: IABP  Allergies  Allergen Reactions  . Aspirin Shortness Of Breath and Other (See Comments)    Asthma symptoms  . Brilinta [Ticagrelor] Shortness Of Breath and Other (See Comments)    Gout   . Percocet [Oxycodone-Acetaminophen] Nausea And Vomiting    Patient can tolerate acetaminophen solely  . Darvon Nausea And Vomiting  . Diovan [Valsartan] Other (See Comments) and Nausea Only    unknown  . Imitrex [Sumatriptan Base] Other (See Comments)    Asthma symptoms, shortness of breath  . Lactose Intolerance (Gi)   . Licorice Flavor [Artificial Licorice Flavor]     Black licorice induces asthma   . Nsaids Nausea And Vomiting and Other (See Comments)    GI upset   . Tape Other (See Comments)    Tears skin off.  Paper tape only please.    Patient Measurements: Height: 5' (152.4 cm) Weight: 148 lb 5.9 oz (67.3 kg) IBW/kg (Calculated) : 45.5 Heparin Dosing Weight: 66kg  Vital Signs: Temp: 98.2 F (36.8 C) (06/18 1200) Temp Source: Oral (06/18 1200) BP: 106/64 mmHg (06/18 1400)  Labs:  Recent Labs  06/16/15 0430  06/17/15 0419 06/17/15 0420  06/17/15 1613  06/17/15 2340 06/18/15 0503 06/18/15 1420  HGB 11.1*  --  11.3*  --   --   --   --   --  11.1*  --   HCT 33.6*  --  35.2*  --   --   --   --   --  33.2*  --   PLT 61*  --  68*  --   --   --   --   --  78*  --   HEPARINUNFRC 0.45  < >  --   --   < >  --   < > 0.27* 0.18* 0.27*  CREATININE 1.41*  < >  --  1.46*  --  1.22*  --   --  1.40*  --   < > = values in this interval not displayed.  Estimated Creatinine Clearance: 35.2 mL/min (by C-G formula based on Cr of 1.4).  Assessment: 63yof with Hx HF on milrinone PTA.  S/p RHC on milrinone and IABP.    Heparin drip 900 units/hr. Heparin level therapeutic (0.27). Goal HL 0.2 - 0.5.  No bleeding noted.  Goal of Therapy:  Heparin level 0.2-0.5 units/ml  Monitor platelets by anticoagulation  protocol: Yes   Plan:  Continue IV heparin 900 units/hr. Confirmatory HL in 6 hours. Daily heparin level and CBC. Monitor for s/sx of bleeding.   Megan E. Supple, Pharm.D Clinical Pharmacy Resident Pager: [phone] 06/18/2015 3:00 PM

## 2015-06-18 NOTE — Progress Notes (Signed)
Advanced Heart Failure Rounding Note   Subjective:    Admitted from HF clinic with increased dyspnea and volume overload.  On 6/10 underwent swan and IABP placement for persistent cardiogenic shock with co-ox in 40s and worsening renal failure. Now on CVVHD (started on 6/11) Remains on  dobutamine 5 and levophed 10 .   Remains on IABP 1:1 and CVVHD. CO-OX today 62%.  Weight down another 4 pounds (total 33 pounds). CVP 9-10  Feels ok. Starting to make a little urine   Echo LVEF 10% RV moderately HK. Severe MR/TR  Objective:   Weight Range:  Vital Signs:   Temp:  [97.4 F (36.3 C)-98.5 F (36.9 C)] 98.2 F (36.8 C) (06/18 0800) Resp:  [12-30] 22 (06/18 0900) BP: (80-121)/(40-82) 98/62 mmHg (06/18 0900) SpO2:  [93 %-100 %] 97 % (06/18 0900) Weight:  [67.3 kg (148 lb 5.9 oz)] 67.3 kg (148 lb 5.9 oz) (06/18 0409) Last BM Date: 06/16/15  Weight change: Filed Weights   06/16/15 0500 06/17/15 0500 06/18/15 0409  Weight: 73.1 kg (161 lb 2.5 oz) 69.2 kg (152 lb 8.9 oz) 67.3 kg (148 lb 5.9 oz)    Intake/Output:   Intake/Output Summary (Last 24 hours) at 06/18/15 0931 Last data filed at 06/18/15 0900  Gross per 24 hour  Intake 1289.43 ml  Output   3455 ml  Net -2165.57 ml     PHYSICAL EXAM: General: Lying flat in bed. HEENT: normal Neck: supple. Carotids 2+ bilaterally; no bruits. No lymphadenopathy or thryomegaly appreciated. Cor: PMI normal. Regular No rubs,2/6 MR/TR +S3 Lungs: clear Abdomen: soft, tender, nondistended. No hepatosplenomegaly. No bruits or masses. Good bowel sounds. Extremities: no cyanosis, clubbing, rash, noedema,IABP in left groin. R and L pedal pulse 2+  CVVHD catheter in R groin  Neurro: alert & orientedx3, cranial nerves grossly intact. Moves all 4 extremities w/o difficulty. Affect pleasant          Telemetry: Sinus 90-low 100s  Labs: Basic Metabolic Panel:  Recent Labs Lab 06/11/15 1251  06/12/15 0415  06/13/15 0428   06/14/15 0400  06/16/15 0430 06/16/15 1600 06/17/15 0420 06/17/15 1613 06/18/15 0503  NA  --   < > 134*  < >  --   < > 135  < > 135 133* 134* 130* 134*  K  --   < > 4.2  < >  --   < > 4.3  < > 4.3 4.3 4.3 3.3* 4.7  CL  --   < > 99*  < >  --   < > 101  < > 99* 99* 100* 103 99*  CO2  --   < > 23  < >  --   < > 26  < > 21* 25  GLUCOSE  --   < > 121*  < >  --   < > 113*  < > 130* 132* 127* 364* 121*  BUN  --   < > 54*  < >  --   < > 14  < > 5* 5*  CREATININE  --   < > 2.81*  < >  --   < > 1.74*  < > 1.41* 1.39* 1.46* 1.22* 1.40*  CALCIUM  --   < > 8.5*  < >  --   < > 8.7*  < > 8.9 8.9 9.2 9.2 9.0  MG 2.4  --  2.6*  --  2.4  --  2.4  --   --   --   --   --   --  PHOS  --   < > 4.5  < >  --   < > 2.2*  < > 1.7* 1.4* 1.9* 1.1* 1.7*  < > = values in this interval not displayed.  Liver Function Tests:  Recent Labs Lab 06/16/15 0430 06/16/15 1600 06/17/15 0420 06/17/15 1613 06/18/15 0503  ALBUMIN 3.1* 3.2* 3.2* 2.3* 3.2*   No results for input(s): LIPASE, AMYLASE in the last 168 hours. No results for input(s): AMMONIA in the last 168 hours.  CBC:  Recent Labs Lab 06/14/15 0400 06/15/15 0445 06/16/15 0430 06/17/15 0419 06/18/15 0503  WBC 6.1 5.4 5.6 5.0 5.0  HGB 11.9* 11.3* 11.1* 11.3* 11.1*  HCT 36.0 34.4* 33.6* 35.2* 33.2*  MCV 66.8* 65.9* 67.2* 67.4* 65.7*  PLT 60* 52* 61* 68* 78*    Cardiac Enzymes: No results for input(s): CKTOTAL, CKMB, CKMBINDEX, TROPONINI in the last 168 hours.  BNP: BNP (last 3 results)  Recent Labs  03/15/15 2150 03/22/15 1601 06/06/2015 1800  BNP 701.4* 930.9* 1217.7*    ProBNP (last 3 results)  Recent Labs  11/08/14 1800  PROBNP 2681.0*      Other results:  Imaging: Dg Chest 1 View  06/16/2015   CLINICAL DATA:  Management of the intra-aortic balloon pump.  EXAM: CHEST  1 VIEW  COMPARISON:  06/15/2015  FINDINGS: Patient is slightly rotated to the right as the lungs are hypoinflated. No significant change in a  right IJ central venous catheter. Left-sided pacemaker are unchanged. Right IJ Swan-Ganz catheter has tip overlying the proximal right lower lobar pulmonary artery unchanged. Intra aortic balloon pump unchanged in position at the approximate T8-9 level over the descending thoracic aorta.  Continued hazy opacification over the perihilar and bibasilar regions which may be due to mild interstitial edema with bilateral effusions/ bibasilar atelectasis. Cannot completely exclude infection. Stable moderate cardiomegaly. Remainder of the exam is unchanged  IMPRESSION: Bilateral perihilar bibasilar opacification without significant change likely mild interstitial edema with bilateral effusions/atelectasis. Cannot completely exclude infection.  Stable moderate cardiomegaly.  Multiple tubes and lines as described unchanged.   Electronically Signed   By: Elberta Fortis M.D.   On: 06/16/2015 10:20   Dg Chest Port 1 View  06/17/2015   CLINICAL DATA:  Intra-aortic balloon pump.  Cardiogenic shock.  EXAM: PORTABLE CHEST - 1 VIEW  COMPARISON:  06/16/2015 and 06/15/2015  FINDINGS: AICD, central venous catheter and aortic balloon pump in place, unchanged.  Chronic cardiomegaly. Improved bilateral pleural effusions. Pulmonary vascularity is now normal.  IMPRESSION: Improving effusions and pulmonary vascularity.   Electronically Signed   By: Francene Boyers M.D.   On: 06/17/2015 12:51     Medications:     Scheduled Medications: . antiseptic oral rinse  7 mL Mouth Rinse BID  . calcitRIOL  0.25 mcg Oral Daily  . DULoxetine  60 mg Oral Daily  . feeding supplement (ENSURE ENLIVE)  237 mL Oral BID  . ferrous sulfate  325 mg Oral TID WC  . gabapentin  200 mg Oral QHS  . pantoprazole  40 mg Oral Daily  . rOPINIRole  2 mg Oral QHS  . sodium chloride  10-40 mL Intracatheter Q12H  . sodium chloride  3 mL Intravenous Q12H  . THROMBI-PAD  1 each Topical Once  . zolpidem  5 mg Oral QHS    Infusions: . sodium chloride 250 mL  (06/16/15 0600)  . DOBUTamine 5 mcg/kg/min (06/18/15 0800)  . heparin 900 Units/hr (06/18/15 0800)  . norepinephrine (LEVOPHED) Adult infusion  10 mcg/min (06/18/15 0800)  . dialysis replacement fluid (prismasate) 300 mL/hr at 06/18/15 0350  . dialysis replacement fluid (prismasate) 200 mL/hr at 06/18/15 0645  . dialysate (PRISMASATE) 1,500 mL/hr at 06/18/15 0400    PRN Medications: sodium chloride, acetaminophen, guaiFENesin-dextromethorphan, heparin, heparin, nitroGLYCERIN, ondansetron (ZOFRAN) IV, ondansetron, polyethylene glycol, sodium chloride, sodium chloride, sodium chloride, traMADol-acetaminophen   Assessment/Plan    1. 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 CAD S/P PCI with DES to prox RCA and stent to distal RCA in 2012. No s/s of ischemia.  3. PSVT - quiescent. No longer on amio 4. Acute on chronic renal failure, stage IV 5. Hyperkalemia 6. Thrombocytopenia  Now euvolemic. Can stop CVVHD. Starting to make urine slowly. Would continue IABP, pressors. Leave CVVHD catheter in for now. Place Foley.  Platelets slightly improved.   IABP appears slightly low on CXR. I repositioned again. Will repeat CXR.  The patient is critically ill with multiple organ systems failure and requires high complexity decision making for assessment and support, frequent evaluation and titration of therapies, application of advanced monitoring technologies and extensive interpretation of multiple databases.   Critical Care Time devoted to patient care services described in this note is 35 Minutes.   Length of Stay: 11  [name] MD 06/18/2015, 9:31 AM Advanced Heart Failure Team Pager [phone] (M-F; 7a - 4p)  Please contact [facility] for night-coverage after hours (4p -7a ) and weekends on [email or web address]

## 2015-06-19 LAB — HEPARIN LEVEL (UNFRACTIONATED)
Heparin Unfractionated: 0.19 IU/mL — ABNORMAL LOW (ref 0.30–0.70)
Heparin Unfractionated: 0.32 IU/mL (ref 0.30–0.70)

## 2015-06-19 LAB — CBC
HCT: 31.4 % — ABNORMAL LOW (ref 36.0–46.0)
Hemoglobin: 10.5 g/dL — ABNORMAL LOW (ref 12.0–15.0)
MCH: 22.1 pg — AB (ref 26.0–34.0)
MCHC: 33.4 g/dL (ref 30.0–36.0)
MCV: 66.1 fL — AB (ref 78.0–100.0)
Platelets: 86 10*3/uL — ABNORMAL LOW (ref 150–400)
RBC: 4.75 MIL/uL (ref 3.87–5.11)
RDW: 20.8 % — ABNORMAL HIGH (ref 11.5–15.5)
WBC: 6.1 10*3/uL (ref 4.0–10.5)

## 2015-06-19 LAB — RENAL FUNCTION PANEL
Albumin: 3 g/dL — ABNORMAL LOW (ref 3.5–5.0)
Anion gap: 6 (ref 5–15)
BUN: 20 mg/dL (ref 6–20)
CALCIUM: 9.5 mg/dL (ref 8.9–10.3)
CO2: 25 mmol/L (ref 22–32)
CREATININE: 2.79 mg/dL — AB (ref 0.44–1.00)
Chloride: 98 mmol/L — ABNORMAL LOW (ref 101–111)
GFR calc Af Amer: 20 mL/min — ABNORMAL LOW (ref >60)
GFR calc non Af Amer: 17 mL/min — ABNORMAL LOW (ref >60)
Glucose, Bld: 175 mg/dL — ABNORMAL HIGH (ref 65–99)
Phosphorus: 1.8 mg/dL — ABNORMAL LOW (ref 2.5–4.6)
Potassium: 4.9 mmol/L (ref 3.5–5.1)
Sodium: 129 mmol/L — ABNORMAL LOW (ref 135–145)

## 2015-06-19 LAB — CARBOXYHEMOGLOBIN
Carboxyhemoglobin: 1.4 % (ref 0.5–1.5)
Methemoglobin: 1.2 % (ref 0.0–1.5)
O2 Saturation: 72.6 %
Total hemoglobin: 10.7 g/dL — ABNORMAL LOW (ref 12.0–16.0)

## 2015-06-19 NOTE — Progress Notes (Signed)
Advanced Heart Failure Rounding Note   Subjective:    Admitted from HF clinic with increased dyspnea and volume overload.  On 6/10 underwent swan and IABP placement for persistent cardiogenic shock with co-ox in 40s and worsening renal failure. Now on CVVHD (started on 6/11) Remains on  dobutamine 5 and levophed 10 .   CVVHD stopped 6/18. CR 1.9-> 2.8  Remains on IABP 1:1. CO-OX today 72%.   Feels ok. Sleepy. Not making much urine.    Echo LVEF 10% RV moderately HK. Severe MR/TR  Objective:   Weight Range:  Vital Signs:   Temp:  [97.9 F (36.6 C)-98.6 F (37 C)] 97.9 F (36.6 C) (06/19 0800) Resp:  [15-35] 20 (06/19 0900) BP: (86-117)/(49-89) 112/71 mmHg (06/19 0900) SpO2:  [93 %-100 %] 100 % (06/19 0900) Weight:  [68.4 kg (150 lb 12.7 oz)] 68.4 kg (150 lb 12.7 oz) (06/19 0243) Last BM Date: 06/16/15  Weight change: Filed Weights   06/17/15 0500 06/18/15 0409 06/19/15 0243  Weight: 69.2 kg (152 lb 8.9 oz) 67.3 kg (148 lb 5.9 oz) 68.4 kg (150 lb 12.7 oz)    Intake/Output:   Intake/Output Summary (Last 24 hours) at 06/19/15 0919 Last data filed at 06/19/15 0900  Gross per 24 hour  Intake 1483.7 ml  Output    232 ml  Net 1251.7 ml     PHYSICAL EXAM: General: Lying flat in bed. HEENT: normal Neck: supple. Carotids 2+ bilaterally; no bruits. No lymphadenopathy or thryomegaly appreciated. Cor: PMI normal. Regular No rubs,2/6 MR/TR +S3 Lungs: clear Abdomen: soft, tender, nondistended. No hepatosplenomegaly. No bruits or masses. Good bowel sounds. Extremities: no cyanosis, clubbing, rash, noedema,IABP in left groin. R and L pedal pulse 2+  CVVHD catheter in R groin  Neurro: alert & orientedx3, cranial nerves grossly intact. Moves all 4 extremities w/o difficulty. Affect pleasant          Telemetry: Sinus low 100s  Labs: Basic Metabolic Panel:  Recent Labs Lab 06/13/15 0428  06/14/15 0400  06/17/15 0420 06/17/15 1613 06/18/15 0503 06/18/15 1420  06/19/15 0532  NA  --   < > 135  < > 134* 130* 134* 132* 129*  K  --   < > 4.3  < > 4.3 3.3* 4.7 4.9 4.9  CL  --   < > 101  < > 100* 103 99* 100* 98*  CO2  --   < > 26  < > 27 21* GLUCOSE  --   < > 113*  < > 127* 364* 121* 179* 175*  BUN  --   < > 14  < > 6 5* 5* 10 20  CREATININE  --   < > 1.74*  < > 1.46* 1.22* 1.40* 1.93* 2.79*  CALCIUM  --   < > 8.7*  < > 9.2 9.2 9.0 9.5 9.5  MG 2.4  --  2.4  --   --   --   --   --   --   PHOS  --   < > 2.2*  < > 1.9* 1.1* 1.7* 1.6* 1.8*  < > = values in this interval not displayed.  Liver Function Tests:  Recent Labs Lab 06/17/15 0420 06/17/15 1613 06/18/15 0503 06/18/15 1420 06/19/15 0532  ALBUMIN 3.2* 2.3* 3.2* 3.1* 3.0*   No results for input(s): LIPASE, AMYLASE in the last 168 hours. No results for input(s): AMMONIA in the last 168 hours.  CBC:  Recent Labs Lab 06/15/15  3005 06/16/15 0430 06/17/15 0419 06/18/15 0503 06/19/15 0533  WBC 5.4 5.6 5.0 5.0 6.1  HGB 11.3* 11.1* 11.3* 11.1* 10.5*  HCT 34.4* 33.6* 35.2* 33.2* 31.4*  MCV 65.9* 67.2* 67.4* 65.7* 66.1*  PLT 52* 61* 68* 78* 86*    Cardiac Enzymes: No results for input(s): CKTOTAL, CKMB, CKMBINDEX, TROPONINI in the last 168 hours.  BNP: BNP (last 3 results)  Recent Labs  03/15/15 2150 03/22/15 1601 06/17/2015 1800  BNP 701.4* 930.9* 1217.7*    ProBNP (last 3 results)  Recent Labs  11/08/14 1800  PROBNP 2681.0*      Other results:  Imaging: Dg Chest Port 1 View  06/18/2015   CLINICAL DATA:  Intra-aortic balloon pump repositioning, CHF  EXAM: PORTABLE CHEST - 1 VIEW  COMPARISON:  06/17/2015  FINDINGS: Intra-aortic balloon pump noted with tip over the aortic arch. Left AICD in place. Right-sided subclavian approach central line tip terminates over the high right atrium but the tip is not optimally visualized. Marked enlargement of the cardiomediastinal silhouette reidentified with presumed posteriorly tracking right greater than left pleural  effusions. Minimal improved aeration at the lung apices. Hazy perihilar opacity could indicate alveolar edema.  IMPRESSION: Mild improvement in biapical aeration with otherwise stable findings as above.  Intra-aortic balloon pump tip now located over the aortic arch.   Electronically Signed   By: Christiana Pellant M.D.   On: 06/18/2015 11:17   Dg Chest Port 1 View  06/17/2015   CLINICAL DATA:  Intra-aortic balloon pump.  Cardiogenic shock.  EXAM: PORTABLE CHEST - 1 VIEW  COMPARISON:  06/16/2015 and 06/15/2015  FINDINGS: AICD, central venous catheter and aortic balloon pump in place, unchanged.  Chronic cardiomegaly. Improved bilateral pleural effusions. Pulmonary vascularity is now normal.  IMPRESSION: Improving effusions and pulmonary vascularity.   Electronically Signed   By: Francene Boyers M.D.   On: 06/17/2015 12:51     Medications:     Scheduled Medications: . antiseptic oral rinse  7 mL Mouth Rinse BID  . calcitRIOL  0.25 mcg Oral Daily  . DULoxetine  60 mg Oral Daily  . feeding supplement (ENSURE ENLIVE)  237 mL Oral BID  . ferrous sulfate  325 mg Oral TID WC  . gabapentin  200 mg Oral QHS  . pantoprazole  40 mg Oral Daily  . rOPINIRole  2 mg Oral QHS  . sodium chloride  10-40 mL Intracatheter Q12H  . sodium chloride  3 mL Intravenous Q12H  . THROMBI-PAD  1 each Topical Once  . zolpidem  5 mg Oral QHS    Infusions: . sodium chloride 250 mL (06/16/15 0600)  . DOBUTamine 5 mcg/kg/min (06/18/15 1400)  . heparin 1,050 Units/hr (06/19/15 1102)  . norepinephrine (LEVOPHED) Adult infusion 9 mcg/min (06/19/15 1117)    PRN Medications: sodium chloride, acetaminophen, guaiFENesin-dextromethorphan, heparin, heparin, nitroGLYCERIN, ondansetron (ZOFRAN) IV, ondansetron, oxybutynin, polyethylene glycol, sodium chloride, sodium chloride, sodium chloride, traMADol-acetaminophen   Assessment/Plan    1. 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 CAD S/P PCI  with DES to prox RCA and stent to distal RCA in 2012. No s/s of ischemia.  3. PSVT - quiescent. No longer on amio 4. Acute on chronic renal failure, stage IV 5. Hyperkalemia 6. Thrombocytopenia  Off CVVHD. No sign of significant Renal recovery. Continue hemodynamic support. If kidney function doesn't improve over next few days will need transition to comfort care.  Platelets slightly improved.   IABP better positioned on CXR.  The patient is critically ill with multiple organ systems failure and requires high complexity decision making for assessment and support, frequent evaluation and titration of therapies, application of advanced monitoring technologies and extensive interpretation of multiple databases.   Critical Care Time devoted to patient care services described in this note is 35 Minutes.   Length of Stay: 12  [name] MD 06/19/2015, 9:19 AM Advanced Heart Failure Team Pager [phone] (M-F; 7a - 4p)  Please contact [facility] for night-coverage after hours (4p -7a ) and weekends on [email or web address]

## 2015-06-19 NOTE — Progress Notes (Signed)
ANTICOAGULATION CONSULT NOTE - Follow Up Consult  Pharmacy Consult for heparin Indication: IABP   Labs:  Recent Labs  06/17/15 0419  06/17/15 1613  06/18/15 0503 06/18/15 1420 06/18/15 1830 06/19/15 0532 06/19/15 0533  HGB 11.3*  --   --   --  11.1*  --   --   --  10.5*  HCT 35.2*  --   --   --  33.2*  --   --   --  31.4*  PLT 68*  --   --   --  78*  --   --   --  PENDING  HEPARINUNFRC  --   < >  --   < > 0.18* 0.27* 0.17* 0.19*  --   CREATININE  --   < > 1.22*  --  1.40* 1.93*  --   --   --   < > = values in this interval not displayed.    Assessment: 64yo female remains slightly subtherapeutic on heparin after one level at goal.  Goal of Therapy:  Heparin level 0.2-0.5 units/ml   Plan:  Will increase heparin gtt slightly to 1050 units/hr and check level in 8hr.  Vernard Gambles, PharmD, BCPS  06/19/2015,6:11 AM

## 2015-06-19 NOTE — Progress Notes (Signed)
Admit: 06/18/2015 LOS: 12  17F w/ biventricular cardiogenic shock, BL SCr 2.6, anuric AKI, with IABP+Dobutamine+NE  Subjective:  CRRT stopped yesterday Foley in, 76mL UOP   06/18 0701 - 06/19 0700 In: 1582.7 [P.O.:890; I.V.:692.7] Out: 629 [Urine:65]  Filed Weights   06/17/15 0500 06/18/15 0409 06/19/15 0243  Weight: 69.2 kg (152 lb 8.9 oz) 67.3 kg (148 lb 5.9 oz) 68.4 kg (150 lb 12.7 oz)    Scheduled Meds: . antiseptic oral rinse  7 mL Mouth Rinse BID  . calcitRIOL  0.25 mcg Oral Daily  . DULoxetine  60 mg Oral Daily  . feeding supplement (ENSURE ENLIVE)  237 mL Oral BID  . ferrous sulfate  325 mg Oral TID WC  . gabapentin  200 mg Oral QHS  . pantoprazole  40 mg Oral Daily  . rOPINIRole  2 mg Oral QHS  . sodium chloride  10-40 mL Intracatheter Q12H  . sodium chloride  3 mL Intravenous Q12H  . THROMBI-PAD  1 each Topical Once  . zolpidem  5 mg Oral QHS   Continuous Infusions: . sodium chloride 250 mL (06/16/15 0600)  . DOBUTamine 5 mcg/kg/min (06/18/15 1400)  . heparin 1,050 Units/hr (06/19/15 4619)  . norepinephrine (LEVOPHED) Adult infusion 9 mcg/min (06/19/15 0613)   PRN Meds:.sodium chloride, acetaminophen, guaiFENesin-dextromethorphan, heparin, heparin, nitroGLYCERIN, ondansetron (ZOFRAN) IV, ondansetron, oxybutynin, polyethylene glycol, sodium chloride, sodium chloride, sodium chloride, traMADol-acetaminophen  Current Labs: reviewed    Physical Exam:  Blood pressure 109/64, pulse 108, temperature 98.3 F (36.8 C), temperature source Oral, resp. rate 35, height 5' (1.524 m), weight 68.4 kg (150 lb 12.7 oz), SpO2 100 %. NAD, lying flat in bed RRR, click of IABP heard Nl wob S/nt No LEE Nonfocal  A/P 1. Dialysis dependent anuric AoCKD 1. BL SCr mid 2s 2. Started CRRT 6/11 for Hypervolemia and Anuric AKI, stopped 06/18/15 3. Cont to monitor for recovery of renal function, UOP 4. Not a candidate for long term HD, hopeful for recovery with optimized cardiac  status 2. Biventricular Systolic Cardiogenic Shock On Dobutamine + NE + IABP 1. Per cardiology 2. Cont UF 3. Hx/o CAD and PCI  Sabra Heck MD 06/19/2015, 7:30 AM   Recent Labs Lab 06/18/15 0503 06/18/15 1420 06/19/15 0532  NA 134* 132* 129*  K 4.7 4.9 4.9  CL 99* 100* 98*  CO2 25 26 25   GLUCOSE 121* 179* 175*  BUN 5* 10 20  CREATININE 1.40* 1.93* 2.79*  CALCIUM 9.0 9.5 9.5  PHOS 1.7* 1.6* 1.8*    Recent Labs Lab 06/17/15 0419 06/18/15 0503 06/19/15 0533  WBC 5.0 5.0 6.1  HGB 11.3* 11.1* 10.5*  HCT 35.2* 33.2* 31.4*  MCV 67.4* 65.7* 66.1*  PLT 68* 78* 86*

## 2015-06-19 NOTE — Progress Notes (Signed)
ANTICOAGULATION CONSULT NOTE  Pharmacy Consult for Heparin  Indication: IABP  Allergies  Allergen Reactions  . Aspirin Shortness Of Breath and Other (See Comments)    Asthma symptoms  . Brilinta [Ticagrelor] Shortness Of Breath and Other (See Comments)    Gout   . Percocet [Oxycodone-Acetaminophen] Nausea And Vomiting    Patient can tolerate acetaminophen solely  . Darvon Nausea And Vomiting  . Diovan [Valsartan] Other (See Comments) and Nausea Only    unknown  . Imitrex [Sumatriptan Base] Other (See Comments)    Asthma symptoms, shortness of breath  . Lactose Intolerance (Gi)   . Licorice Flavor [Artificial Licorice Flavor]     Black licorice induces asthma   . Nsaids Nausea And Vomiting and Other (See Comments)    GI upset   . Tape Other (See Comments)    Tears skin off.  Paper tape only please.    Patient Measurements: Height: 5' (152.4 cm) Weight: 150 lb 12.7 oz (68.4 kg) IBW/kg (Calculated) : 45.5 Heparin Dosing Weight: 66kg  Vital Signs: Temp: 97.9 F (36.6 C) (06/19 0800) Temp Source: Oral (06/19 0800) BP: 96/36 mmHg (06/19 1000)  Labs:  Recent Labs  06/17/15 0419  06/18/15 0503 06/18/15 1420 06/18/15 1830 06/19/15 0532 06/19/15 0533 06/19/15 1400  HGB 11.3*  --  11.1*  --   --   --  10.5*  --   HCT 35.2*  --  33.2*  --   --   --  31.4*  --   PLT 68*  --  78*  --   --   --  86*  --   HEPARINUNFRC  --   < > 0.18* 0.27* 0.17* 0.19*  --  0.32  CREATININE  --   < > 1.40* 1.93*  --  2.79*  --   --   < > = values in this interval not displayed.  Estimated Creatinine Clearance: 17.8 mL/min (by C-G formula based on Cr of 2.79).  Assessment: 67 YOF who continues on heparin for anticoagulation while IABP in place with a therapeutic heparin level (HL 0.32, goal of 0.2-0.5). No overt s/sx of bleeding noted or issues with the drip noted per RN. Hgb low stable, plt trending up 68>>78>>86.  Goal of Therapy:  Heparin level 0.2-0.5 units/ml  Monitor platelets  by anticoagulation protocol: Yes   Plan:  - Continue heparin 1050 units/hr  - Daily HL and CBC - Monitor for s/sx bleeding   Megan E. Supple, Pharm.D Clinical Pharmacy Resident Pager: [phone] 06/19/2015 3:02 PM

## 2015-06-20 ENCOUNTER — Encounter (HOSPITAL_COMMUNITY): Payer: Self-pay | Admitting: *Deleted

## 2015-06-20 LAB — RENAL FUNCTION PANEL
ALBUMIN: 2.8 g/dL — AB (ref 3.5–5.0)
Anion gap: 8 (ref 5–15)
BUN: 34 mg/dL — ABNORMAL HIGH (ref 6–20)
CHLORIDE: 97 mmol/L — AB (ref 101–111)
CO2: 24 mmol/L (ref 22–32)
CREATININE: 3.65 mg/dL — AB (ref 0.44–1.00)
Calcium: 9.9 mg/dL (ref 8.9–10.3)
GFR calc Af Amer: 14 mL/min — ABNORMAL LOW (ref >60)
GFR calc non Af Amer: 12 mL/min — ABNORMAL LOW (ref >60)
Glucose, Bld: 141 mg/dL — ABNORMAL HIGH (ref 65–99)
POTASSIUM: 5.6 mmol/L — AB (ref 3.5–5.1)
Phosphorus: 2.4 mg/dL — ABNORMAL LOW (ref 2.5–4.6)
SODIUM: 129 mmol/L — AB (ref 135–145)

## 2015-06-20 LAB — HEPARIN LEVEL (UNFRACTIONATED): HEPARIN UNFRACTIONATED: 0.21 [IU]/mL — AB (ref 0.30–0.70)

## 2015-06-20 LAB — CBC
HCT: 29.9 % — ABNORMAL LOW (ref 36.0–46.0)
Hemoglobin: 10 g/dL — ABNORMAL LOW (ref 12.0–15.0)
MCH: 21.7 pg — ABNORMAL LOW (ref 26.0–34.0)
MCHC: 33.4 g/dL (ref 30.0–36.0)
MCV: 65 fL — ABNORMAL LOW (ref 78.0–100.0)
Platelets: 95 10*3/uL — ABNORMAL LOW (ref 150–400)
RBC: 4.6 MIL/uL (ref 3.87–5.11)
RDW: 20.4 % — ABNORMAL HIGH (ref 11.5–15.5)
WBC: 5.8 10*3/uL (ref 4.0–10.5)

## 2015-06-20 LAB — CARBOXYHEMOGLOBIN
Carboxyhemoglobin: 1.2 % (ref 0.5–1.5)
Methemoglobin: 1 % (ref 0.0–1.5)
O2 Saturation: 54 %
Total hemoglobin: 10.3 g/dL — ABNORMAL LOW (ref 12.0–16.0)

## 2015-06-20 LAB — GLUCOSE, CAPILLARY: GLUCOSE-CAPILLARY: 126 mg/dL — AB (ref 65–99)

## 2015-06-20 NOTE — Progress Notes (Addendum)
Advanced Heart Failure Rounding Note   Subjective:    Admitted from HF clinic with increased dyspnea and volume overload.  On 6/10 underwent swan and IABP placement for persistent cardiogenic shock with co-ox in 40s and worsening renal failure. Now on CVVHD (started on 6/11) Remains on  dobutamine 5 and levophed 10 .   CVVHD stopped 6/18. CR 1.9-> 2.8-->3.65 K 5.6  Remains on IABP 1:1. CO-OX today 54%. Remains on norepi 9 mcg + Dob 5 mcg.    Anuric. Denies SOB.    Echo LVEF 10% RV moderately HK. Severe MR/TR  Objective:   Weight Range:  Vital Signs:   Temp:  [97.9 F (36.6 C)-98 F (36.7 C)] 98 F (36.7 C) (06/20 0000) Resp:  [13-28] 21 (06/20 0600) BP: (93-117)/(36-82) 105/67 mmHg (06/20 0600) SpO2:  [94 %-100 %] 97 % (06/20 0600) Weight:  [154 lb 15.7 oz (70.3 kg)] 154 lb 15.7 oz (70.3 kg) (06/20 0500) Last BM Date: 06/16/15  Weight change: Filed Weights   06/18/15 0409 06/19/15 0243 06/20/15 0500  Weight: 148 lb 5.9 oz (67.3 kg) 150 lb 12.7 oz (68.4 kg) 154 lb 15.7 oz (70.3 kg)    Intake/Output:   Intake/Output Summary (Last 24 hours) at 06/20/15 0723 Last data filed at 06/20/15 0600  Gross per 24 hour  Intake 1360.4 ml  Output     89 ml  Net 1271.4 ml     PHYSICAL EXAM: General: Lying flat in bed. HEENT: normal Neck: supple. Carotids 2+ bilaterally; no bruits. No lymphadenopathy or thryomegaly appreciated. Cor: PMI normal. Regular No rubs,2/6 MR/TR +S3 Lungs: clear Abdomen: soft, tender, nondistended. No hepatosplenomegaly. No bruits or masses. Good bowel sounds. Extremities: no cyanosis, clubbing, rash, noedema,IABP in left groin. R and L pedal pulse 2+  CVVHD catheter in R groin  Neurro: alert & orientedx3, cranial nerves grossly intact. Moves all 4 extremities w/o difficulty. Affect pleasant          Telemetry: Sinus low 100s  Labs: Basic Metabolic Panel:  Recent Labs Lab 06/14/15 0400  06/17/15 1613 06/18/15 0503 06/18/15 1420  06/19/15 0532 06/20/15 0518  NA 135  < > 130* 134* 132* 129* 129*  K 4.3  < > 3.3* 4.7 4.9 4.9 5.6*  CL 101  < > 103 99* 100* 98* 97*  CO2 26  < > 21* 25 26 25 24   GLUCOSE 113*  < > 364* 121* 179* 175* 141*  BUN 14  < > 5* 5* 10 20 34*  CREATININE 1.74*  < > 1.22* 1.40* 1.93* 2.79* 3.65*  CALCIUM 8.7*  < > 9.2 9.0 9.5 9.5 9.9  MG 2.4  --   --   --   --   --   --   PHOS 2.2*  < > 1.1* 1.7* 1.6* 1.8* 2.4*  < > = values in this interval not displayed.  Liver Function Tests:  Recent Labs Lab 06/17/15 1613 06/18/15 0503 06/18/15 1420 06/19/15 0532 06/20/15 0518  ALBUMIN 2.3* 3.2* 3.1* 3.0* 2.8*   No results for input(s): LIPASE, AMYLASE in the last 168 hours. No results for input(s): AMMONIA in the last 168 hours.  CBC:  Recent Labs Lab 06/16/15 0430 06/17/15 0419 06/18/15 0503 06/19/15 0533 06/20/15 0518  WBC 5.6 5.0 5.0 6.1 5.8  HGB 11.1* 11.3* 11.1* 10.5* 10.0*  HCT 33.6* 35.2* 33.2* 31.4* 29.9*  MCV 67.2* 67.4* 65.7* 66.1* 65.0*  PLT 61* 68* 78* 86* 95*    Cardiac Enzymes: No results for  input(s): CKTOTAL, CKMB, CKMBINDEX, TROPONINI in the last 168 hours.  BNP: BNP (last 3 results)  Recent Labs  03/15/15 2150 03/22/15 1601 06/13/2015 1800  BNP 701.4* 930.9* 1217.7*    ProBNP (last 3 results)  Recent Labs  11/08/14 1800  PROBNP 2681.0*      Other results:  Imaging: Dg Chest Port 1 View  06/18/2015   CLINICAL DATA:  Intra-aortic balloon pump repositioning, CHF  EXAM: PORTABLE CHEST - 1 VIEW  COMPARISON:  06/17/2015  FINDINGS: Intra-aortic balloon pump noted with tip over the aortic arch. Left AICD in place. Right-sided subclavian approach central line tip terminates over the high right atrium but the tip is not optimally visualized. Marked enlargement of the cardiomediastinal silhouette reidentified with presumed posteriorly tracking right greater than left pleural effusions. Minimal improved aeration at the lung apices. Hazy perihilar opacity could  indicate alveolar edema.  IMPRESSION: Mild improvement in biapical aeration with otherwise stable findings as above.  Intra-aortic balloon pump tip now located over the aortic arch.   Electronically Signed   By: Christiana Pellant M.D.   On: 06/18/2015 11:17     Medications:     Scheduled Medications: . antiseptic oral rinse  7 mL Mouth Rinse BID  . calcitRIOL  0.25 mcg Oral Daily  . DULoxetine  60 mg Oral Daily  . feeding supplement (ENSURE ENLIVE)  237 mL Oral BID  . ferrous sulfate  325 mg Oral TID WC  . gabapentin  200 mg Oral QHS  . pantoprazole  40 mg Oral Daily  . rOPINIRole  2 mg Oral QHS  . sodium chloride  10-40 mL Intracatheter Q12H  . sodium chloride  3 mL Intravenous Q12H  . THROMBI-PAD  1 each Topical Once  . zolpidem  5 mg Oral QHS    Infusions: . sodium chloride 250 mL (06/16/15 0600)  . DOBUTamine 5 mcg/kg/min (06/19/15 2250)  . heparin 1,050 Units/hr (06/19/15 0959)  . norepinephrine (LEVOPHED) Adult infusion 9 mcg/min (06/19/15 0959)    PRN Medications: sodium chloride, acetaminophen, guaiFENesin-dextromethorphan, heparin, heparin, nitroGLYCERIN, ondansetron (ZOFRAN) IV, ondansetron, oxybutynin, polyethylene glycol, sodium chloride, sodium chloride, sodium chloride, traMADol-acetaminophen   Assessment/Plan    1. 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 CAD S/P PCI with DES to prox RCA and stent to distal RCA in 2012. No s/s of ischemia.  3. PSVT - quiescent. No longer on amio 4. Acute on chronic renal failure, stage IV 5. Hyperkalemia 6. Thrombocytopenia  Off CVVHD. No sign of significant Renal recovery. Continue hemodynamic support. Worsening renal function. Poor urine output noted. CO--OX down to 54%. Will need to transition to comfort care. She would like to discuss with her partner today. Would like to go home but not sure her family can provide enough support.   Platelets slightly improved.   Length of Stay: 13   CLEGG,AMY NP-C  06/20/2015, 7:23 AM Advanced Heart Failure Team Pager [phone] (M-F; 7a - 4p)  Please contact [facility] for night-coverage after hours (4p -7a ) and weekends on [email or web address]  Patient seen and examined with Tonye Becket, NP. We discussed all aspects of the encounter. I agree with the assessment and plan as stated above.   Remains anuric. No sign of renal recovery despite maximal hemodynamic support. Will need to switch to comfort care. Will call family meeting to discuss.   The patient is critically ill with multiple organ systems failure and requires high complexity decision making for assessment and support, frequent evaluation  and titration of therapies, application of advanced monitoring technologies and extensive interpretation of multiple databases.   Critical Care Time devoted to patient care services described in this note is 35 Minutes.  Glenroy Crossen,MD 9:10 AM     Addendum:  Long talk with patient and family about situation and prognosis. We will continue IABP one more day; if no improvement in kidney function consider switching to comfort care as other options very limited.   Equilla Que,MD 2:38 PM

## 2015-06-20 NOTE — Progress Notes (Signed)
ANTICOAGULATION CONSULT NOTE  Pharmacy Consult for Heparin  Indication: IABP  Allergies  Allergen Reactions  . Aspirin Shortness Of Breath and Other (See Comments)    Asthma symptoms  . Brilinta [Ticagrelor] Shortness Of Breath and Other (See Comments)    Gout   . Percocet [Oxycodone-Acetaminophen] Nausea And Vomiting    Patient can tolerate acetaminophen solely  . Darvon Nausea And Vomiting  . Diovan [Valsartan] Other (See Comments) and Nausea Only    unknown  . Imitrex [Sumatriptan Base] Other (See Comments)    Asthma symptoms, shortness of breath  . Lactose Intolerance (Gi)   . Licorice Flavor [Artificial Licorice Flavor]     Black licorice induces asthma   . Nsaids Nausea And Vomiting and Other (See Comments)    GI upset   . Tape Other (See Comments)    Tears skin off.  Paper tape only please.    Patient Measurements: Height: 5' (152.4 cm) Weight: 154 lb 15.7 oz (70.3 kg) IBW/kg (Calculated) : 45.5 Heparin Dosing Weight: 66kg  Vital Signs: Temp: 98.4 F (36.9 C) (06/20 1100) Temp Source: Oral (06/20 1100) BP: 114/51 mmHg (06/20 1400)  Labs:  Recent Labs  06/18/15 0503 06/18/15 1420  06/19/15 0532 06/19/15 0533 06/19/15 1400 06/20/15 0517 06/20/15 0518  HGB 11.1*  --   --   --  10.5*  --   --  10.0*  HCT 33.2*  --   --   --  31.4*  --   --  29.9*  PLT 78*  --   --   --  86*  --   --  95*  HEPARINUNFRC 0.18* 0.27*  < > 0.19*  --  0.32 0.21*  --   CREATININE 1.40* 1.93*  --  2.79*  --   --   --  3.65*  < > = values in this interval not displayed.  Estimated Creatinine Clearance: 13.8 mL/min (by C-G formula based on Cr of 3.65).  Assessment: 38 YOF who continues on heparin for anticoagulation while IABP in place with a therapeutic heparin level (HL 0.21, goal of 0.2-0.5). No overt s/sx of bleeding noted or issues with the drip noted per RN. Hgb low stable, plt trending up 68>>78>>86>95  Goal of Therapy:  Heparin level 0.2-0.5 units/ml  Monitor  platelets by anticoagulation protocol: Yes   Plan:  - Continue heparin 1050 units/hr  - Daily HL and CBC - Monitor for s/sx bleeding   [name] Pharm.D. CPP, BCPS Clinical Pharmacist [phone] 06/20/2015 3:28 PM

## 2015-06-20 NOTE — Progress Notes (Signed)
[facility] Rounding Note  Subjective:  CRRT stopped 6/18 Remains oligoanuric K slowly rising Obviously no evidence of functional renal recovery - she is aware of this and is asking "if we stop everything now, how long do I have"?  06/19 0701 - 06/20 0700 In: 1360.4 [P.O.:790; I.V.:570.4] Out: 89 [Urine:89]  Filed Weights   06/18/15 0409 06/19/15 0243 06/20/15 0500  Weight: 67.3 kg (148 lb 5.9 oz) 68.4 kg (150 lb 12.7 oz) 70.3 kg (154 lb 15.7 oz)    Physical Exam:  Blood pressure 108/68, pulse 108, temperature 98.8 F (37.1 C), temperature source Oral, resp. rate 20, height 5' (1.524 m), weight 70.3 kg (154 lb 15.7 oz), SpO2 97 %. NAD, lying flat in bed, very pleasant Regular HR 100's BP as noted IABP in place Abd soft, non-distended No edema of LE's Nonfocal  Scheduled Meds: . antiseptic oral rinse  7 mL Mouth Rinse BID  . calcitRIOL  0.25 mcg Oral Daily  . DULoxetine  60 mg Oral Daily  . feeding supplement (ENSURE ENLIVE)  237 mL Oral BID  . ferrous sulfate  325 mg Oral TID WC  . gabapentin  200 mg Oral QHS  . pantoprazole  40 mg Oral Daily  . rOPINIRole  2 mg Oral QHS  . sodium chloride  10-40 mL Intracatheter Q12H  . sodium chloride  3 mL Intravenous Q12H  . THROMBI-PAD  1 each Topical Once  . zolpidem  5 mg Oral QHS   Continuous Infusions: . sodium chloride 250 mL (06/16/15 0600)  . DOBUTamine 5 mcg/kg/min (06/19/15 2250)  . heparin 1,050 Units/hr (06/19/15 0959)  . norepinephrine (LEVOPHED) Adult infusion 9 mcg/min (06/19/15 0959)   PRN Meds:.sodium chloride, acetaminophen, guaiFENesin-dextromethorphan, heparin, heparin, nitroGLYCERIN, ondansetron (ZOFRAN) IV, ondansetron, oxybutynin, polyethylene glycol, sodium chloride, sodium chloride, sodium chloride, traMADol-acetaminophen  Current Labs: reviewed    Recent Labs Lab 06/18/15 1420 06/19/15 0532 06/20/15 0518  NA 132* 129* 129*  K 4.9 4.9 5.6*  CL 100* 98* 97*  CO2 26 25 24   GLUCOSE  179* 175* 141*  BUN 10 20 34*  CREATININE 1.93* 2.79* 3.65*  CALCIUM 9.5 9.5 9.9  PHOS 1.6* 1.8* 2.4*    Recent Labs Lab 06/18/15 0503 06/19/15 0533 06/20/15 0518  WBC 5.0 6.1 5.8  HGB 11.1* 10.5* 10.0*  HCT 33.2* 31.4* 29.9*  MCV 65.7* 66.1* 65.0*  PLT 78* 86* 95*    Background: 18F w/ biventricular cardiogenic shock, BL SCr 2.6, anuric AKI, with IABP+Dobutamine+NE  A/P 1. Dialysis dependent anuric AoCKD 1. BL SCr mid 2s 2. Started CRRT 6/11 for Hypervolemia and Anuric AKI, stopped 06/18/15 3. No evidence for any renal recovery, oligoanuric, creatinine continues to rise off CRRT and K is drifting up as well 4. Not a candidate for long term HD - plan as I understand it will be to transition to comfort care (I have not treated the hyperkalemia at this time) 2. Biventricular Systolic Cardiogenic Shock On Dobutamine + NE + IABP 1. Per cardiology  3. Hx/o CAD and PCI  Camille Bal, MD [facility] [phone] Pager 06/20/2015, 7:47 AM

## 2015-06-21 ENCOUNTER — Inpatient Hospital Stay (HOSPITAL_COMMUNITY): Payer: BLUE CROSS/BLUE SHIELD

## 2015-06-21 LAB — RENAL FUNCTION PANEL
ANION GAP: 7 (ref 5–15)
Albumin: 3 g/dL — ABNORMAL LOW (ref 3.5–5.0)
BUN: 46 mg/dL — ABNORMAL HIGH (ref 6–20)
CALCIUM: 9.9 mg/dL (ref 8.9–10.3)
CO2: 24 mmol/L (ref 22–32)
CREATININE: 3.93 mg/dL — AB (ref 0.44–1.00)
Chloride: 97 mmol/L — ABNORMAL LOW (ref 101–111)
GFR calc Af Amer: 13 mL/min — ABNORMAL LOW (ref >60)
GFR, EST NON AFRICAN AMERICAN: 11 mL/min — AB (ref >60)
GLUCOSE: 128 mg/dL — AB (ref 65–99)
POTASSIUM: 5.5 mmol/L — AB (ref 3.5–5.1)
Phosphorus: 3.4 mg/dL (ref 2.5–4.6)
Sodium: 128 mmol/L — ABNORMAL LOW (ref 135–145)

## 2015-06-21 LAB — CBC
HEMATOCRIT: 30.5 % — AB (ref 36.0–46.0)
Hemoglobin: 10.2 g/dL — ABNORMAL LOW (ref 12.0–15.0)
MCH: 22.1 pg — ABNORMAL LOW (ref 26.0–34.0)
MCHC: 33.4 g/dL (ref 30.0–36.0)
MCV: 66 fL — ABNORMAL LOW (ref 78.0–100.0)
Platelets: 107 10*3/uL — ABNORMAL LOW (ref 150–400)
RBC: 4.62 MIL/uL (ref 3.87–5.11)
RDW: 20.9 % — ABNORMAL HIGH (ref 11.5–15.5)
WBC: 5.5 10*3/uL (ref 4.0–10.5)

## 2015-06-21 LAB — CARBOXYHEMOGLOBIN
Carboxyhemoglobin: 1 % (ref 0.5–1.5)
Methemoglobin: 1 % (ref 0.0–1.5)
O2 Saturation: 64 %
TOTAL HEMOGLOBIN: 10.2 g/dL — AB (ref 12.0–16.0)

## 2015-06-21 LAB — HEPARIN LEVEL (UNFRACTIONATED): Heparin Unfractionated: 0.28 IU/mL — ABNORMAL LOW (ref 0.30–0.70)

## 2015-06-21 LAB — GLUCOSE, CAPILLARY: GLUCOSE-CAPILLARY: 114 mg/dL — AB (ref 65–99)

## 2015-06-21 MED ORDER — FENTANYL CITRATE (PF) 100 MCG/2ML IJ SOLN
INTRAMUSCULAR | Status: AC
  Filled 2015-06-21: qty 2

## 2015-06-21 MED ORDER — FENTANYL CITRATE (PF) 100 MCG/2ML IJ SOLN
25.0000 ug | INTRAMUSCULAR | Status: DC | PRN
  Administered 2015-06-21 – 2015-06-30 (×4): 50 ug via INTRAVENOUS
  Filled 2015-06-21 (×3): qty 2

## 2015-06-21 NOTE — Progress Notes (Signed)
Advanced Heart Failure Rounding Note   Subjective:    Admitted from HF clinic with increased dyspnea and volume overload.  On 6/10 underwent swan and IABP placement for persistent cardiogenic shock with co-ox in 40s and worsening renal failure. Now on CVVHD (started on 6/11) Remains on  dobutamine 5 and levophed 10 .   CVVHD stopped 6/18. CR 1.9-> 2.8-->3.65>3.93  K 5.5  Remains on IABP 1:1. CO-OX today 64%. Remains on norepi 9 mcg + Dob 5 mcg.    Anuric. Denies SOB. Feels ok. CVP 11   Echo LVEF 10% RV moderately HK. Severe MR/TR  Objective:   Weight Range:  Vital Signs:   Temp:  [98 F (36.7 C)-98.8 F (37.1 C)] 98 F (36.7 C) (06/21 0400) Resp:  [10-27] 17 (06/21 0600) BP: (100-132)/(43-91) 100/52 mmHg (06/21 0600) SpO2:  [94 %-100 %] 100 % (06/21 0600) Weight:  [151 lb 3.8 oz (68.6 kg)] 151 lb 3.8 oz (68.6 kg) (06/21 0500) Last BM Date: 06/16/15  Weight change: Filed Weights   06/19/15 0243 06/20/15 0500 06/21/15 0500  Weight: 150 lb 12.7 oz (68.4 kg) 154 lb 15.7 oz (70.3 kg) 151 lb 3.8 oz (68.6 kg)    Intake/Output:   Intake/Output Summary (Last 24 hours) at 06/21/15 0715 Last data filed at 06/21/15 0600  Gross per 24 hour  Intake 1165.6 ml  Output    152 ml  Net 1013.6 ml     PHYSICAL EXAM: General: Lying flat in bed. HEENT: normal Neck: supple. JVP to jaw. Carotids 2+ bilaterally; no bruits. No lymphadenopathy or thryomegaly appreciated. Cor: PMI normal. Regular No rubs,2/6 MR/TR +S3 Lungs: clear Abdomen: soft, tender, nondistended. No hepatosplenomegaly. No bruits or masses. Good bowel sounds. Extremities: no cyanosis, clubbing, rash, noedema,IABP in left groin. R and L pedal pulse 2+  CVVHD catheter in R groin  Neurro: alert & orientedx3, cranial nerves grossly intact. Moves all 4 extremities w/o difficulty. Affect pleasant          Telemetry: Sinus low 100s  Labs: Basic Metabolic Panel:  Recent Labs Lab 06/18/15 0503 06/18/15 1420  06/19/15 0532 06/20/15 0518 06/21/15 0435  NA 134* 132* 129* 129* 128*  K 4.7 4.9 4.9 5.6* 5.5*  CL 99* 100* 98* 97* 97*  CO2 25 26 25 24 24   GLUCOSE 121* 179* 175* 141* 128*  BUN 5* 10 20 34* 46*  CREATININE 1.40* 1.93* 2.79* 3.65* 3.93*  CALCIUM 9.0 9.5 9.5 9.9 9.9  PHOS 1.7* 1.6* 1.8* 2.4* 3.4    Liver Function Tests:  Recent Labs Lab 06/18/15 0503 06/18/15 1420 06/19/15 0532 06/20/15 0518 06/21/15 0435  ALBUMIN 3.2* 3.1* 3.0* 2.8* 3.0*   No results for input(s): LIPASE, AMYLASE in the last 168 hours. No results for input(s): AMMONIA in the last 168 hours.  CBC:  Recent Labs Lab 06/17/15 0419 06/18/15 0503 06/19/15 0533 06/20/15 0518 06/21/15 0435  WBC 5.0 5.0 6.1 5.8 5.5  HGB 11.3* 11.1* 10.5* 10.0* 10.2*  HCT 35.2* 33.2* 31.4* 29.9* 30.5*  MCV 67.4* 65.7* 66.1* 65.0* 66.0*  PLT 68* 78* 86* 95* 107*    Cardiac Enzymes: No results for input(s): CKTOTAL, CKMB, CKMBINDEX, TROPONINI in the last 168 hours.  BNP: BNP (last 3 results)  Recent Labs  03/15/15 2150 03/22/15 1601 June 08, 2015 1800  BNP 701.4* 930.9* 1217.7*    ProBNP (last 3 results)  Recent Labs  11/08/14 1800  PROBNP 2681.0*      Other results:  Imaging: No results found.   Medications:  Scheduled Medications: . antiseptic oral rinse  7 mL Mouth Rinse BID  . calcitRIOL  0.25 mcg Oral Daily  . DULoxetine  60 mg Oral Daily  . feeding supplement (ENSURE ENLIVE)  237 mL Oral BID  . ferrous sulfate  325 mg Oral TID WC  . gabapentin  200 mg Oral QHS  . pantoprazole  40 mg Oral Daily  . rOPINIRole  2 mg Oral QHS  . sodium chloride  10-40 mL Intracatheter Q12H  . sodium chloride  3 mL Intravenous Q12H  . THROMBI-PAD  1 each Topical Once  . zolpidem  5 mg Oral QHS    Infusions: . sodium chloride 250 mL (06/16/15 0600)  . DOBUTamine 5 mcg/kg/min (06/20/15 0800)  . heparin 1,050 Units/hr (06/20/15 1045)  . norepinephrine (LEVOPHED) Adult infusion 9 mcg/min (06/20/15  1549)    PRN Medications: sodium chloride, acetaminophen, guaiFENesin-dextromethorphan, nitroGLYCERIN, ondansetron (ZOFRAN) IV, ondansetron, oxybutynin, polyethylene glycol, sodium chloride, sodium chloride, sodium chloride, traMADol-acetaminophen   Assessment/Plan    1. 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 CAD S/P PCI with DES to prox RCA and stent to distal RCA in 2012. No s/s of ischemia.  3. PSVT - quiescent. No longer on amio 4. Acute on chronic renal failure, stage IV 5. Hyperkalemia 6. Thrombocytopenia  Off CVVHD. No sign of significant Renal recovery. Continue hemodynamic support. Worsening renal function. No urine output. CO--OX 64%.   Will need to transition to comfort care today. Anticipate removing IABP today. .   Platelets slightly improved.   Length of Stay: 14  CLEGG,AMY NP-C  06/21/2015, 7:15 AM Advanced Heart Failure Team Pager [phone] (M-F; 7a - 4p)   Patient seen and examined with Tonye Becket, NP. We discussed all aspects of the encounter. I agree with the assessment and plan as stated above.   Remains anuric. Creatinine worsening. No uremic symptoms at this point. I discussed the options with her and she wants to give hemodynamic support one more day. Will pull CVVHD catheter today and keep IABP. If she develops uremia or symptomatic volume overload will switch to comfort care.   The patient is critically ill with multiple organ systems failure and requires high complexity decision making for assessment and support, frequent evaluation and titration of therapies, application of advanced monitoring technologies and extensive interpretation of multiple databases.   Critical Care Time devoted to patient care services described in this note is 35 Minutes.  Bensimhon, Daniel,MD 9:14 AM

## 2015-06-21 NOTE — Progress Notes (Signed)
ANTICOAGULATION CONSULT NOTE  Pharmacy Consult for Heparin  Indication: IABP  Allergies  Allergen Reactions  . Aspirin Shortness Of Breath and Other (See Comments)    Asthma symptoms  . Brilinta [Ticagrelor] Shortness Of Breath and Other (See Comments)    Gout   . Percocet [Oxycodone-Acetaminophen] Nausea And Vomiting    Patient can tolerate acetaminophen solely  . Darvon Nausea And Vomiting  . Diovan [Valsartan] Other (See Comments) and Nausea Only    unknown  . Imitrex [Sumatriptan Base] Other (See Comments)    Asthma symptoms, shortness of breath  . Lactose Intolerance (Gi)   . Licorice Flavor [Artificial Licorice Flavor]     Black licorice induces asthma   . Nsaids Nausea And Vomiting and Other (See Comments)    GI upset   . Tape Other (See Comments)    Tears skin off.  Paper tape only please.    Patient Measurements: Height: 5' (152.4 cm) Weight: 151 lb 3.8 oz (68.6 kg) IBW/kg (Calculated) : 45.5 Heparin Dosing Weight: 66kg  Vital Signs: Temp: 98.1 F (36.7 C) (06/21 0800) Temp Source: Oral (06/21 0800) BP: 107/45 mmHg (06/21 0800)  Labs:  Recent Labs  06/19/15 0532  06/19/15 0533 06/19/15 1400 06/20/15 0517 06/20/15 0518 06/21/15 0435  HGB  --   < > 10.5*  --   --  10.0* 10.2*  HCT  --   --  31.4*  --   --  29.9* 30.5*  PLT  --   --  86*  --   --  95* 107*  HEPARINUNFRC 0.19*  --   --  0.32 0.21*  --  0.28*  CREATININE 2.79*  --   --   --   --  3.65* 3.93*  < > = values in this interval not displayed.  Estimated Creatinine Clearance: 12.7 mL/min (by C-G formula based on Cr of 3.93).  Assessment: 26 YOF who continues on heparin for anticoagulation while IABP in place with a therapeutic heparin level (HL 0.28, goal of 0.2-0.5). No overt s/sx of bleeding noted or issues with the drip noted per RN. Hgb low stable, plt trending up.  Plan to continue IABP 1 more day.  Goal of Therapy:  Heparin level 0.2-0.5 units/ml  Monitor platelets by  anticoagulation protocol: Yes   Plan:  - Continue heparin 1050 units/hr  - Daily HL and CBC - Monitor for s/sx bleeding   [name] Pharm.D. CPP, BCPS Clinical Pharmacist [phone] 06/21/2015 9:04 AM

## 2015-06-21 NOTE — Progress Notes (Signed)
Right femoral HD cath removed, pressure held for 15 minutes until hemostasis achieved.  Site level 0 and right pedal pulses +2.  Patient instructed on leg movement restrictions.  Will continue to monitor. [place], Mitzi Hansen

## 2015-06-21 NOTE — Progress Notes (Signed)
[facility]  Pt remains oligoanuric Creatinine continues to rise - up to 3.93 today. I have spoken with Dr. Teressa Lower - he plans to get balloon pump out today and move to comfort care. In that vein, will sign off - he will call me should the plans change.  Camille Bal, MD [facility] [phone] Pager 06/21/2015, 8:36 AM

## 2015-06-21 NOTE — Progress Notes (Signed)
Nutrition Follow-up  DOCUMENTATION CODES:  Non-severe (moderate) malnutrition in context of acute illness/injury  INTERVENTION:  Ensure Enlive (each supplement provides 350kcal and 20 grams of protein)  NUTRITION DIAGNOSIS:  Malnutrition related to acute illness as evidenced by energy intake < 75% for > 7 days, mild depletion of muscle mass.  Ongoing.   GOAL:  Patient will meet greater than or equal to 90% of their needs  Not met.   MONITOR:  PO intake, Supplement acceptance, Labs, Weight trends, I & O's, Skin  REASON FOR ASSESSMENT:  Malnutrition Screening Tool    ASSESSMENT:  Admitted from HF clinic with increased dyspnea and volume overload. On 6/10 underwent swan and IABP placement for persistent cardiogenic shock with co-ox in 8s and worsening renal failure. Now on CVVHD (started on 6/11).  CVVHD stopped 6/18.  Pt - 9 L, remains anuric. BUN and Cr rising. Per MD will give pt one more day of hemodynamic support with IABP. If she becomes volume overloaded will switch to comfort care.   Labs and medications reviewed: Sodium low, Potassium, BUN/Cr elevated.  PO intake poor 25-50%. Pt being offered ensure bid.   Height:  Ht Readings from Last 1 Encounters:  06/02/2015 5' (1.524 m)    Weight:  Wt Readings from Last 1 Encounters:  06/21/15 151 lb 3.8 oz (68.6 kg)    Ideal Body Weight:  48 kg  Wt Readings from Last 10 Encounters:  06/21/15 151 lb 3.8 oz (68.6 kg)  06/22/2015 174 lb 8 oz (79.153 kg)  05/31/15 179 lb (81.194 kg)  04/13/15 172 lb (78.019 kg)  03/26/15 181 lb 4.8 oz (82.237 kg)  03/15/15 178 lb 8 oz (80.967 kg)  02/03/15 180 lb (81.647 kg)  01/17/15 177 lb 3.2 oz (80.377 kg)  12/13/14 184 lb 7 oz (83.66 kg)  11/15/14 188 lb 12.8 oz (85.639 kg)    BMI:  Body mass index is 29.54 kg/(m^2).  Estimated Nutritional Needs:  Kcal:  1600 - 1800  Protein:  120 - 140 g  Fluid:  per MD  Skin:  Reviewed, no issues  Diet Order:  Diet renal with  fluid restriction Fluid restriction:: 1500 mL Fluid; Room service appropriate?: Yes; Fluid consistency:: Thin  EDUCATION NEEDS:  Education needs addressed   Intake/Output Summary (Last 24 hours) at 06/21/15 0939 Last data filed at 06/21/15 0900  Gross per 24 hour  Intake 1080.4 ml  Output    138 ml  Net  942.4 ml    Last BM:  6/16  Maylon Peppers RD, [place], [place] Pager [phone] After Hours Pager

## 2015-06-22 LAB — RENAL FUNCTION PANEL
Albumin: 2.9 g/dL — ABNORMAL LOW (ref 3.5–5.0)
Anion gap: 11 (ref 5–15)
BUN: 52 mg/dL — AB (ref 6–20)
CO2: 23 mmol/L (ref 22–32)
CREATININE: 3.89 mg/dL — AB (ref 0.44–1.00)
Calcium: 10.4 mg/dL — ABNORMAL HIGH (ref 8.9–10.3)
Chloride: 96 mmol/L — ABNORMAL LOW (ref 101–111)
GFR calc Af Amer: 13 mL/min — ABNORMAL LOW (ref >60)
GFR calc non Af Amer: 11 mL/min — ABNORMAL LOW (ref >60)
Glucose, Bld: 141 mg/dL — ABNORMAL HIGH (ref 65–99)
Phosphorus: 4.2 mg/dL (ref 2.5–4.6)
Potassium: 5.5 mmol/L — ABNORMAL HIGH (ref 3.5–5.1)
Sodium: 130 mmol/L — ABNORMAL LOW (ref 135–145)

## 2015-06-22 LAB — CBC
HCT: 29.3 % — ABNORMAL LOW (ref 36.0–46.0)
Hemoglobin: 9.8 g/dL — ABNORMAL LOW (ref 12.0–15.0)
MCH: 21.7 pg — ABNORMAL LOW (ref 26.0–34.0)
MCHC: 33.4 g/dL (ref 30.0–36.0)
MCV: 65 fL — AB (ref 78.0–100.0)
PLATELETS: 114 10*3/uL — AB (ref 150–400)
RBC: 4.51 MIL/uL (ref 3.87–5.11)
RDW: 20.6 % — AB (ref 11.5–15.5)
WBC: 5 10*3/uL (ref 4.0–10.5)

## 2015-06-22 LAB — URINALYSIS, ROUTINE W REFLEX MICROSCOPIC
BILIRUBIN URINE: NEGATIVE
Glucose, UA: NEGATIVE mg/dL
Ketones, ur: NEGATIVE mg/dL
NITRITE: NEGATIVE
PROTEIN: 30 mg/dL — AB
SPECIFIC GRAVITY, URINE: 1.007 (ref 1.005–1.030)
UROBILINOGEN UA: 0.2 mg/dL (ref 0.0–1.0)
pH: 5 (ref 5.0–8.0)

## 2015-06-22 LAB — BASIC METABOLIC PANEL
Anion gap: 8 (ref 5–15)
BUN: 57 mg/dL — AB (ref 6–20)
CO2: 24 mmol/L (ref 22–32)
CREATININE: 3.82 mg/dL — AB (ref 0.44–1.00)
Calcium: 10.4 mg/dL — ABNORMAL HIGH (ref 8.9–10.3)
Chloride: 99 mmol/L — ABNORMAL LOW (ref 101–111)
GFR calc non Af Amer: 12 mL/min — ABNORMAL LOW (ref >60)
GFR, EST AFRICAN AMERICAN: 13 mL/min — AB (ref >60)
Glucose, Bld: 140 mg/dL — ABNORMAL HIGH (ref 65–99)
Potassium: 5.4 mmol/L — ABNORMAL HIGH (ref 3.5–5.1)
Sodium: 131 mmol/L — ABNORMAL LOW (ref 135–145)

## 2015-06-22 LAB — URINE MICROSCOPIC-ADD ON

## 2015-06-22 LAB — CARBOXYHEMOGLOBIN
Carboxyhemoglobin: 1.4 % (ref 0.5–1.5)
Methemoglobin: 1.4 % (ref 0.0–1.5)
O2 SAT: 55.6 %
TOTAL HEMOGLOBIN: 11.7 g/dL — AB (ref 12.0–16.0)

## 2015-06-22 LAB — HEPARIN LEVEL (UNFRACTIONATED): HEPARIN UNFRACTIONATED: 0.34 [IU]/mL (ref 0.30–0.70)

## 2015-06-22 MED ORDER — FUROSEMIDE 10 MG/ML IJ SOLN
160.0000 mg | Freq: Once | INTRAVENOUS | Status: AC
  Administered 2015-06-22: 160 mg via INTRAVENOUS
  Filled 2015-06-22: qty 16

## 2015-06-22 NOTE — Progress Notes (Signed)
Advanced Heart Failure Rounding Note   Subjective:    Admitted from HF clinic with increased dyspnea and volume overload.  On 6/10 underwent swan and IABP placement for persistent cardiogenic shock with co-ox in 40s and worsening renal failure. Now on CVVHD (started on 6/11) Remains on  dobutamine 5 and levophed 10 .   CVVHD stopped 6/18. CR 1.9-> 2.8-->3.65>3.93 >3.8 K 5.5  Remains on IABP 1:1. CO-OX today 55%. Remains on norepi 12 mcg + Dob 5 mcg.   Feels ok. Confused at times but oriented. Denies dyspnea or pain. CVP 10-12  Echo LVEF 10% RV moderately HK. Severe MR/TR  Objective:   Weight Range:  Vital Signs:   Temp:  [97.4 F (36.3 C)-98.2 F (36.8 C)] 98.2 F (36.8 C) (06/22 0400) Pulse Rate:  [98-101] 101 (06/21 1600) Resp:  [12-32] 18 (06/22 0700) BP: (107-137)/(32-97) 125/75 mmHg (06/22 0700) SpO2:  [93 %-100 %] 100 % (06/22 0700) Weight:  [155 lb 3.3 oz (70.4 kg)] 155 lb 3.3 oz (70.4 kg) (06/22 0500) Last BM Date:  (pt does not remember last BM)  Weight change: Filed Weights   06/20/15 0500 06/21/15 0500 06/22/15 0500  Weight: 154 lb 15.7 oz (70.3 kg) 151 lb 3.8 oz (68.6 kg) 155 lb 3.3 oz (70.4 kg)    Intake/Output:   Intake/Output Summary (Last 24 hours) at 06/22/15 0728 Last data filed at 06/22/15 0700  Gross per 24 hour  Intake 968.71 ml  Output    274 ml  Net 694.71 ml     PHYSICAL EXAM: General: Lying flat in bed. HEENT: normal Neck: supple. JVP to jaw. Carotids 2+ bilaterally; no bruits. No lymphadenopathy or thryomegaly appreciated. Cor: PMI normal. Regular No rubs,2/6 MR/TR +S3 Lungs: clear Abdomen: soft, tender, nondistended. No hepatosplenomegaly. No bruits or masses. Good bowel sounds. Extremities: no cyanosis, clubbing, rash, noedema,IABP in left groin. R and L pedal pulse 2+   Neurro: alert & orientedx3, cranial nerves grossly intact. Moves all 4 extremities w/o difficulty. Affect pleasant          Telemetry: Sinus low  100s  Labs: Basic Metabolic Panel:  Recent Labs Lab 06/18/15 1420 06/19/15 0532 06/20/15 0518 06/21/15 0435 06/22/15 0445  NA 132* 129* 129* 128* 130*  K 4.9 4.9 5.6* 5.5* 5.5*  CL 100* 98* 97* 97* 96*  CO2 26 25 24 24 23   GLUCOSE 179* 175* 141* 128* 141*  BUN 10 20 34* 46* 52*  CREATININE 1.93* 2.79* 3.65* 3.93* 3.89*  CALCIUM 9.5 9.5 9.9 9.9 10.4*  PHOS 1.6* 1.8* 2.4* 3.4 4.2    Liver Function Tests:  Recent Labs Lab 06/18/15 1420 06/19/15 0532 06/20/15 0518 06/21/15 0435 06/22/15 0445  ALBUMIN 3.1* 3.0* 2.8* 3.0* 2.9*   No results for input(s): LIPASE, AMYLASE in the last 168 hours. No results for input(s): AMMONIA in the last 168 hours.  CBC:  Recent Labs Lab 06/18/15 0503 06/19/15 0533 06/20/15 0518 06/21/15 0435 06/22/15 0445  WBC 5.0 6.1 5.8 5.5 5.0  HGB 11.1* 10.5* 10.0* 10.2* 9.8*  HCT 33.2* 31.4* 29.9* 30.5* 29.3*  MCV 65.7* 66.1* 65.0* 66.0* 65.0*  PLT 78* 86* 95* 107* 114*    Cardiac Enzymes: No results for input(s): CKTOTAL, CKMB, CKMBINDEX, TROPONINI in the last 168 hours.  BNP: BNP (last 3 results)  Recent Labs  03/15/15 2150 03/22/15 1601 06/11/2015 1800  BNP 701.4* 930.9* 1217.7*    ProBNP (last 3 results)  Recent Labs  11/08/14 1800  PROBNP 2681.0*  Other results:  Imaging: Dg Chest 1 View  06/21/2015   CLINICAL DATA:  Management of intra aortic balloon pump.  EXAM: CHEST  1 VIEW  COMPARISON:  06/18/2015  FINDINGS: Left-sided pacer unchanged. Right IJ central venous catheter unchanged with tip overlying the region of the SVC. Intra aortic balloon pump projects slightly more inferiorly than previously seen likely in the descending thoracic aorta at the level of the AP window.  Lungs are hypoinflated with persistent bibasilar and bilateral perihilar opacification likely effusions with basilar atelectasis and component of interstitial edema. Cannot completely exclude infection. Stable cardiomegaly. Remainder of the exam  is unchanged.  IMPRESSION: Stable cardiomegaly with hypoinflated lungs and continued bilateral perihilar and bibasilar opacification likely mild interstitial edema with bilateral effusions/ atelectasis. Cannot exclude infection in the lung bases.  Tubes and lines as described.   Electronically Signed   By: Elberta Fortis M.D.   On: 06/21/2015 09:38     Medications:     Scheduled Medications: . antiseptic oral rinse  7 mL Mouth Rinse BID  . calcitRIOL  0.25 mcg Oral Daily  . DULoxetine  60 mg Oral Daily  . feeding supplement (ENSURE ENLIVE)  237 mL Oral BID  . ferrous sulfate  325 mg Oral TID WC  . gabapentin  200 mg Oral QHS  . pantoprazole  40 mg Oral Daily  . rOPINIRole  2 mg Oral QHS  . sodium chloride  10-40 mL Intracatheter Q12H  . sodium chloride  3 mL Intravenous Q12H  . THROMBI-PAD  1 each Topical Once  . zolpidem  5 mg Oral QHS    Infusions: . sodium chloride 250 mL (06/16/15 0600)  . DOBUTamine 5 mcg/kg/min (06/21/15 2231)  . heparin 1,050 Units/hr (06/22/15 0645)  . norepinephrine (LEVOPHED) Adult infusion 12 mcg/min (06/21/15 1844)    PRN Medications: sodium chloride, acetaminophen, fentaNYL (SUBLIMAZE) injection, guaiFENesin-dextromethorphan, nitroGLYCERIN, ondansetron (ZOFRAN) IV, ondansetron, oxybutynin, polyethylene glycol, sodium chloride, sodium chloride, sodium chloride, traMADol-acetaminophen   Assessment/Plan    1. 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 CAD S/P PCI with DES to prox RCA and stent to distal RCA in 2012. No s/s of ischemia.  3. PSVT - quiescent. No longer on amio 4. Acute on chronic renal failure, stage IV 5. Hyperkalemia 6. Thrombocytopenia  Off CVVHD. No sign of significant Renal recovery. Continue hemodynamic support. Worsening renal function. No urine output. CO--OX 55%.   Will need to transition to comfort care today. Anticipate removing IABP today. .   Platelets slightly improved.   Length  of Stay: 15  CLEGG,AMY NP-C  06/22/2015, 7:28 AM Advanced Heart Failure Team Pager [phone] (M-F; 7a - 4p)   Patient seen and examined with Tonye Becket, NP. We discussed all aspects of the encounter. I agree with the assessment and plan as stated above.   Remains on IABP. BP stable on norepi and dobutamine. BUN/creatinine seem to have leveled off. Urine output remains poor. Will give lasix 160 IV x 1 and assess response. Will continue watchful waiting. If develops worsening uremia or symptomatic volume overload unresponsive to diuretics will switch to comfort care. Spoke with patient and family.   The patient is critically ill with multiple organ systems failure and requires high complexity decision making for assessment and support, frequent evaluation and titration of therapies, application of advanced monitoring technologies and extensive interpretation of multiple databases.   Critical Care Time devoted to patient care services described in this note is 35 Minutes.  Edward Guthmiller,  Kahli Fitzgerald,MD 10:24 AM

## 2015-06-22 NOTE — Progress Notes (Signed)
ANTICOAGULATION CONSULT NOTE  Pharmacy Consult for Heparin  Indication: IABP  Allergies  Allergen Reactions  . Aspirin Shortness Of Breath and Other (See Comments)    Asthma symptoms  . Brilinta [Ticagrelor] Shortness Of Breath and Other (See Comments)    Gout   . Percocet [Oxycodone-Acetaminophen] Nausea And Vomiting    Patient can tolerate acetaminophen solely  . Darvon Nausea And Vomiting  . Diovan [Valsartan] Other (See Comments) and Nausea Only    unknown  . Imitrex [Sumatriptan Base] Other (See Comments)    Asthma symptoms, shortness of breath  . Lactose Intolerance (Gi)   . Licorice Flavor [Artificial Licorice Flavor]     Black licorice induces asthma   . Nsaids Nausea And Vomiting and Other (See Comments)    GI upset   . Tape Other (See Comments)    Tears skin off.  Paper tape only please.    Patient Measurements: Height: 5' (152.4 cm) Weight: 155 lb 3.3 oz (70.4 kg) IBW/kg (Calculated) : 45.5 Heparin Dosing Weight: 66kg  Vital Signs: Temp: 98.3 F (36.8 C) (06/22 0800) Temp Source: Oral (06/22 0800) BP: 136/94 mmHg (06/22 1000)  Labs:  Recent Labs  06/20/15 0517  06/20/15 0518 06/21/15 0435 06/22/15 0445  HGB  --   < > 10.0* 10.2* 9.8*  HCT  --   --  29.9* 30.5* 29.3*  PLT  --   --  95* 107* 114*  HEPARINUNFRC 0.21*  --   --  0.28* 0.34  CREATININE  --   --  3.65* 3.93* 3.89*  < > = values in this interval not displayed.  Estimated Creatinine Clearance: 13 mL/min (by C-G formula based on Cr of 3.89).  Assessment: 9 YOF who continues on heparin for anticoagulation while IABP in place with a therapeutic heparin level (HL 0.34, goal of 0.2-0.5). No overt s/sx of bleeding noted or issues with the drip noted per RN. Hgb low stable, plt trending up.   Goal of Therapy:  Heparin level 0.2-0.5 units/ml  Monitor platelets by anticoagulation protocol: Yes   Plan:  - Continue heparin 1050 units/hr  - Daily HL and CBC - Monitor for s/sx  bleeding   [name] Pharm.D. CPP, BCPS Clinical Pharmacist [phone] 06/22/2015 10:21 AM

## 2015-06-23 ENCOUNTER — Inpatient Hospital Stay (HOSPITAL_COMMUNITY): Payer: BLUE CROSS/BLUE SHIELD

## 2015-06-23 LAB — RENAL FUNCTION PANEL
ANION GAP: 8 (ref 5–15)
Albumin: 3 g/dL — ABNORMAL LOW (ref 3.5–5.0)
BUN: 58 mg/dL — AB (ref 6–20)
CHLORIDE: 99 mmol/L — AB (ref 101–111)
CO2: 26 mmol/L (ref 22–32)
Calcium: 10.7 mg/dL — ABNORMAL HIGH (ref 8.9–10.3)
Creatinine, Ser: 3.48 mg/dL — ABNORMAL HIGH (ref 0.44–1.00)
GFR calc Af Amer: 15 mL/min — ABNORMAL LOW (ref >60)
GFR calc non Af Amer: 13 mL/min — ABNORMAL LOW (ref >60)
Glucose, Bld: 108 mg/dL — ABNORMAL HIGH (ref 65–99)
Phosphorus: 4.7 mg/dL — ABNORMAL HIGH (ref 2.5–4.6)
Potassium: 5.1 mmol/L (ref 3.5–5.1)
Sodium: 133 mmol/L — ABNORMAL LOW (ref 135–145)

## 2015-06-23 LAB — CBC
HCT: 29.8 % — ABNORMAL LOW (ref 36.0–46.0)
HCT: 28 % — ABNORMAL LOW (ref 36.0–46.0)
HEMOGLOBIN: 9.5 g/dL — AB (ref 12.0–15.0)
Hemoglobin: 10 g/dL — ABNORMAL LOW (ref 12.0–15.0)
MCH: 22.1 pg — ABNORMAL LOW (ref 26.0–34.0)
MCH: 22 pg — AB (ref 26.0–34.0)
MCHC: 33.6 g/dL (ref 30.0–36.0)
MCHC: 33.9 g/dL (ref 30.0–36.0)
MCV: 65.9 fL — ABNORMAL LOW (ref 78.0–100.0)
MCV: 64.8 fL — ABNORMAL LOW (ref 78.0–100.0)
PLATELETS: 127 10*3/uL — AB (ref 150–400)
Platelets: 115 10*3/uL — ABNORMAL LOW (ref 150–400)
RBC: 4.52 MIL/uL (ref 3.87–5.11)
RBC: 4.32 MIL/uL (ref 3.87–5.11)
RDW: 20.4 % — ABNORMAL HIGH (ref 11.5–15.5)
RDW: 20.1 % — ABNORMAL HIGH (ref 11.5–15.5)
WBC: 4.8 10*3/uL (ref 4.0–10.5)
WBC: 5.3 10*3/uL (ref 4.0–10.5)

## 2015-06-23 LAB — TYPE AND SCREEN
ABO/RH(D): B NEG
ANTIBODY SCREEN: NEGATIVE

## 2015-06-23 LAB — CARBOXYHEMOGLOBIN
CARBOXYHEMOGLOBIN: 1.6 % — AB (ref 0.5–1.5)
CARBOXYHEMOGLOBIN: 1.5 % (ref 0.5–1.5)
Carboxyhemoglobin: 1.1 % (ref 0.5–1.5)
Carboxyhemoglobin: 1.6 % — ABNORMAL HIGH (ref 0.5–1.5)
METHEMOGLOBIN: 1 % (ref 0.0–1.5)
METHEMOGLOBIN: 1.3 % (ref 0.0–1.5)
Methemoglobin: 1.5 % (ref 0.0–1.5)
Methemoglobin: 1.3 % (ref 0.0–1.5)
O2 SAT: 66.1 %
O2 Saturation: 52.3 %
O2 Saturation: 59.5 %
O2 Saturation: 65.7 %
TOTAL HEMOGLOBIN: 9.5 g/dL — AB (ref 12.0–16.0)
TOTAL HEMOGLOBIN: 9.8 g/dL — AB (ref 12.0–16.0)
Total hemoglobin: 10.2 g/dL — ABNORMAL LOW (ref 12.0–16.0)
Total hemoglobin: 10 g/dL — ABNORMAL LOW (ref 12.0–16.0)

## 2015-06-23 LAB — ABO/RH: ABO/RH(D): B NEG

## 2015-06-23 LAB — HEMOGLOBIN AND HEMATOCRIT, BLOOD
HCT: 28 % — ABNORMAL LOW (ref 36.0–46.0)
HEMOGLOBIN: 9.4 g/dL — AB (ref 12.0–15.0)

## 2015-06-23 LAB — HEPARIN LEVEL (UNFRACTIONATED): Heparin Unfractionated: 0.33 IU/mL (ref 0.30–0.70)

## 2015-06-23 LAB — POCT ACTIVATED CLOTTING TIME: Activated Clotting Time: 134 seconds

## 2015-06-23 MED ORDER — FUROSEMIDE 10 MG/ML IJ SOLN
160.0000 mg | Freq: Once | INTRAVENOUS | Status: AC
  Administered 2015-06-23: 160 mg via INTRAVENOUS
  Filled 2015-06-23: qty 16

## 2015-06-23 MED ORDER — ENOXAPARIN SODIUM 30 MG/0.3ML ~~LOC~~ SOLN
30.0000 mg | SUBCUTANEOUS | Status: DC
  Administered 2015-06-24 – 2015-07-02 (×9): 30 mg via SUBCUTANEOUS
  Filled 2015-06-23 (×10): qty 0.3

## 2015-06-23 MED ORDER — CEFTRIAXONE SODIUM IN DEXTROSE 20 MG/ML IV SOLN
1.0000 g | INTRAVENOUS | Status: AC
  Administered 2015-06-23 – 2015-06-29 (×7): 1 g via INTRAVENOUS
  Filled 2015-06-23 (×7): qty 50

## 2015-06-23 MED ORDER — ATROPINE SULFATE 0.1 MG/ML IJ SOLN
INTRAMUSCULAR | Status: AC
  Filled 2015-06-23: qty 10

## 2015-06-23 NOTE — Progress Notes (Signed)
Went to patient's room at 0601 and found patient in a pool of blood oozing from IABP insertion site on left groin. Heparin stopped and pressure held for 35 minutes. PA at bedside to evaluate. Order given to hold heparin until 0730. Pt is hemodynamically stable  at this time and has palpable distal pulses in bilateral lower extremities.No Hematoma appreciated at this time Will continue to check for hematoma.

## 2015-06-23 NOTE — Progress Notes (Signed)
Pedal pulses palpable +2. IABP sheath pulled at 1050 by [facility]. Pt tolerated well. VSS. Pressure held for 25 minutes.

## 2015-06-23 NOTE — Progress Notes (Signed)
Patient had perfuse bleeding in L groin at the site of IABP, nursing staff temp held IV heparin. Applied pressure and stopped the bleeding. Discussed with Dr. Gala Romney, will ask cath lab personal to check on the patient and hold IV heparin for 1 hrs.  Note, the IABP came out roughly 1 cm during compression. I will hold on order CXR for now as there is a question whether the IABP can come out today. Dr. Gala Romney made aware of patient condition.   During compression, patient denies any CP, SOB, dizziness but does states she does not feel good, however unable to tell me the symptom. Instructed nurse to continue monitoring for now.  Ramond Dial PA Pager: [phone]

## 2015-06-23 NOTE — Progress Notes (Signed)
ANTICOAGULATION CONSULT NOTE  Pharmacy Consult for Heparin  Indication: IABP  Allergies  Allergen Reactions  . Aspirin Shortness Of Breath and Other (See Comments)    Asthma symptoms  . Brilinta [Ticagrelor] Shortness Of Breath and Other (See Comments)    Gout   . Percocet [Oxycodone-Acetaminophen] Nausea And Vomiting    Patient can tolerate acetaminophen solely  . Darvon Nausea And Vomiting  . Diovan [Valsartan] Other (See Comments) and Nausea Only    unknown  . Imitrex [Sumatriptan Base] Other (See Comments)    Asthma symptoms, shortness of breath  . Lactose Intolerance (Gi)   . Licorice Flavor [Artificial Licorice Flavor]     Black licorice induces asthma   . Nsaids Nausea And Vomiting and Other (See Comments)    GI upset   . Tape Other (See Comments)    Tears skin off.  Paper tape only please.    Patient Measurements: Height: 5' (152.4 cm) Weight: 154 lb 1.6 oz (69.9 kg) IBW/kg (Calculated) : 45.5 Heparin Dosing Weight: 66kg  Vital Signs: Temp: 97.5 F (36.4 C) (06/23 0729) Temp Source: Oral (06/23 0729) BP: 130/78 mmHg (06/23 0700) Pulse Rate: 109 (06/23 0729)  Labs:  Recent Labs  06/21/15 0435 06/22/15 0445 06/22/15 1500 06/23/15 0436  HGB 10.2* 9.8*  --  10.0*  HCT 30.5* 29.3*  --  29.8*  PLT 107* 114*  --  127*  HEPARINUNFRC 0.28* 0.34  --  0.33  CREATININE 3.93* 3.89* 3.82* 3.48*    Estimated Creatinine Clearance: 14.4 mL/min (by C-G formula based on Cr of 3.48).  Assessment: Andrea [name] who continues on heparin for anticoagulation while IABP in place with a therapeutic heparin level (HL 0.33, goal of 0.2-0.5). Hgb low stable, plt trending up. Pt oozing from IABP site early this am.  Heparin turned off for 1hr, pressure applied.  No bleeding now, no hematoma noted, heparin drip restarted at previous rate.    Goal of Therapy:  Heparin level 0.2-0.5 units/ml  Monitor platelets by anticoagulation protocol: Yes   Plan:  - Continue heparin 1050  units/hr  - Daily HL and CBC - Monitor for s/sx bleeding   [name] Pharm.D. CPP, BCPS Clinical Pharmacist [phone] 06/23/2015 8:01 AM

## 2015-06-23 NOTE — Progress Notes (Signed)
Pt reporting more twitching in shoulders. Twitching present at rest. Grips strong bilaterally with equal BUE Extension. Smile is equal and symmetrical. Pupils equal and reactive to light. MD made aware and will assess. No new orders at this time.

## 2015-06-23 NOTE — Progress Notes (Addendum)
Advanced Heart Failure Rounding Note   Subjective:    Admitted from HF clinic with increased dyspnea and volume overload.  On 6/10 underwent swan and IABP placement for persistent cardiogenic shock with co-ox in 40s and worsening renal failure. Now on CVVHD (started on 6/11) Remains on  dobutamine 5 and levophed 10 .   CVVHD stopped 6/18. CR 1.9-> 2.8-->3.65>3.93 >3.8>3.4  2L urine out yesterday with IV lasix. IABP sheath got pulled back last night and had groin bleed. Manual pressure held. Heparin held but now restarted. HGB down slightly. Co-ox remains low on IABP 1:1, norepi 12 mcg + Dob 5 mcg.   Feels ok. Denies pain or dyspnea. UA +  CVP 12-13  Echo LVEF 10% RV moderately HK. Severe MR/TR  Objective:   Weight Range:  Vital Signs:   Temp:  [97.5 F (36.4 C)-98.2 F (36.8 C)] 97.5 F (36.4 C) (06/23 0729) Pulse Rate:  [109] 109 (06/23 0729) Resp:  [14-36] 16 (06/23 0700) BP: (112-142)/(35-118) 130/78 mmHg (06/23 0700) SpO2:  [93 %-100 %] 100 % (06/23 0700) Weight:  [69.9 kg (154 lb 1.6 oz)] 69.9 kg (154 lb 1.6 oz) (06/23 0500) Last BM Date: 06/22/15  Weight change: Filed Weights   06/21/15 0500 06/22/15 0500 06/23/15 0500  Weight: 68.6 kg (151 lb 3.8 oz) 70.4 kg (155 lb 3.3 oz) 69.9 kg (154 lb 1.6 oz)    Intake/Output:   Intake/Output Summary (Last 24 hours) at 06/23/15 0821 Last data filed at 06/23/15 0700  Gross per 24 hour  Intake 1516.35 ml  Output   1879 ml  Net -362.65 ml     PHYSICAL EXAM: General: Lying flat in bed. HEENT: normal Neck: supple. JVP to jaw. Carotids 2+ bilaterally; no bruits. No lymphadenopathy or thryomegaly appreciated. Cor: PMI normal. Regular No rubs,2/6 MR/TR +S3 Lungs: clear Abdomen: soft, tender, nondistended. No hepatosplenomegaly. No bruits or masses. Good bowel sounds. Extremities: no cyanosis, clubbing, rash, noedema,IABP in left groin with sheath pulled back. R and L pedal pulse 2+   Neurro: alert & orientedx3, cranial  nerves grossly intact. Moves all 4 extremities w/o difficulty. Affect pleasant          Telemetry: Sinus low 100s  Labs: Basic Metabolic Panel:  Recent Labs Lab 06/19/15 0532 06/20/15 0518 06/21/15 0435 06/22/15 0445 06/22/15 1500 06/23/15 0436  NA 129* 129* 128* 130* 131* 133*  K 4.9 5.6* 5.5* 5.5* 5.4* 5.1  CL 98* 97* 97* 96* 99* 99*  CO2 25 24 24 23 24 26   GLUCOSE 175* 141* 128* 141* 140* 108*  BUN 20 34* 46* 52* 57* 58*  CREATININE 2.79* 3.65* 3.93* 3.89* 3.82* 3.48*  CALCIUM 9.5 9.9 9.9 10.4* 10.4* 10.7*  PHOS 1.8* 2.4* 3.4 4.2  --  4.7*    Liver Function Tests:  Recent Labs Lab 06/19/15 0532 06/20/15 0518 06/21/15 0435 06/22/15 0445 06/23/15 0436  ALBUMIN 3.0* 2.8* 3.0* 2.9* 3.0*   No results for input(s): LIPASE, AMYLASE in the last 168 hours. No results for input(s): AMMONIA in the last 168 hours.  CBC:  Recent Labs Lab 06/19/15 0533 06/20/15 0518 06/21/15 0435 06/22/15 0445 06/23/15 0436  WBC 6.1 5.8 5.5 5.0 4.8  HGB 10.5* 10.0* 10.2* 9.8* 10.0*  HCT 31.4* 29.9* 30.5* 29.3* 29.8*  MCV 66.1* 65.0* 66.0* 65.0* 65.9*  PLT 86* 95* 107* 114* 127*    Cardiac Enzymes: No results for input(s): CKTOTAL, CKMB, CKMBINDEX, TROPONINI in the last 168 hours.  BNP: BNP (last 3 results)  Recent  Labs  03/15/15 2150 03/22/15 1601 Jun 08, 2015 1800  BNP 701.4* 930.9* 1217.7*    ProBNP (last 3 results)  Recent Labs  11/08/14 1800  PROBNP 2681.0*      Other results:  Imaging: Dg Chest 1 View  06/21/2015   CLINICAL DATA:  Management of intra aortic balloon pump.  EXAM: CHEST  1 VIEW  COMPARISON:  06/18/2015  FINDINGS: Left-sided pacer unchanged. Right IJ central venous catheter unchanged with tip overlying the region of the SVC. Intra aortic balloon pump projects slightly more inferiorly than previously seen likely in the descending thoracic aorta at the level of the AP window.  Lungs are hypoinflated with persistent bibasilar and bilateral  perihilar opacification likely effusions with basilar atelectasis and component of interstitial edema. Cannot completely exclude infection. Stable cardiomegaly. Remainder of the exam is unchanged.  IMPRESSION: Stable cardiomegaly with hypoinflated lungs and continued bilateral perihilar and bibasilar opacification likely mild interstitial edema with bilateral effusions/ atelectasis. Cannot exclude infection in the lung bases.  Tubes and lines as described.   Electronically Signed   By: Elberta Fortis M.D.   On: 06/21/2015 09:38     Medications:     Scheduled Medications: . antiseptic oral rinse  7 mL Mouth Rinse BID  . calcitRIOL  0.25 mcg Oral Daily  . DULoxetine  60 mg Oral Daily  . feeding supplement (ENSURE ENLIVE)  237 mL Oral BID  . ferrous sulfate  325 mg Oral TID WC  . gabapentin  200 mg Oral QHS  . pantoprazole  40 mg Oral Daily  . rOPINIRole  2 mg Oral QHS  . sodium chloride  10-40 mL Intracatheter Q12H  . sodium chloride  3 mL Intravenous Q12H  . THROMBI-PAD  1 each Topical Once  . zolpidem  5 mg Oral QHS    Infusions: . sodium chloride 250 mL (06/16/15 0600)  . DOBUTamine 5 mcg/kg/min (06/21/15 2231)  . heparin 1,050 Units/hr (06/23/15 0725)  . norepinephrine (LEVOPHED) Adult infusion 12 mcg/min (06/22/15 2007)    PRN Medications: sodium chloride, acetaminophen, fentaNYL (SUBLIMAZE) injection, guaiFENesin-dextromethorphan, nitroGLYCERIN, ondansetron (ZOFRAN) IV, ondansetron, oxybutynin, polyethylene glycol, sodium chloride, sodium chloride, sodium chloride, traMADol-acetaminophen   Assessment/Plan    1. 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 CAD S/P PCI with DES to prox RCA and stent to distal RCA in 2012. No s/s of ischemia.  3. PSVT - quiescent. No longer on amio 4. Acute on chronic renal failure, stage IV 5. Hyperkalemia 6. Acute blood loss anemia   Renal function beginning to return. Urine output much improved. CR coming  down slowly.   IABP pulled back accidentally last night with significant bleeding H/H down slight. Sheath pulled back and waveform on IABP is poor. Will check CXR but likely will need to pull IABP today. Will continue to support with inotropes and follow renal function. If renal function continues to improve will have to decide if she will be a candidate for VAD. I told her and her family that it would be  A long shot for her to qualify for VAD but that we will take it one day at a time. I clarified code status with her today. NO intubation or CPR. Everything else ok.   Will turn heparin off. Check Co-ox and ACT in 1 hour. Give lasix 160 IV bid. Treat UTI with rocephin.   The patient is critically ill with multiple organ systems failure and requires high complexity decision making for assessment and support, frequent  evaluation and titration of therapies, application of advanced monitoring technologies and extensive interpretation of multiple databases.   Critical Care Time devoted to patient care services described in this note is 50 Minutes.  Nguyen Butler,MD 8:21 AM Advanced Heart Failure Team Pager [phone] (M-F; 7a - 4p)

## 2015-06-24 ENCOUNTER — Encounter: Payer: Self-pay | Admitting: Internal Medicine

## 2015-06-24 LAB — CBC
HEMATOCRIT: 27.7 % — AB (ref 36.0–46.0)
Hemoglobin: 9.3 g/dL — ABNORMAL LOW (ref 12.0–15.0)
MCH: 21.6 pg — AB (ref 26.0–34.0)
MCHC: 33.6 g/dL (ref 30.0–36.0)
MCV: 64.4 fL — ABNORMAL LOW (ref 78.0–100.0)
Platelets: 187 10*3/uL (ref 150–400)
RBC: 4.3 MIL/uL (ref 3.87–5.11)
RDW: 20.3 % — AB (ref 11.5–15.5)
WBC: 5.1 10*3/uL (ref 4.0–10.5)

## 2015-06-24 LAB — RENAL FUNCTION PANEL
ANION GAP: 10 (ref 5–15)
Albumin: 3 g/dL — ABNORMAL LOW (ref 3.5–5.0)
BUN: 66 mg/dL — AB (ref 6–20)
CO2: 26 mmol/L (ref 22–32)
Calcium: 10.7 mg/dL — ABNORMAL HIGH (ref 8.9–10.3)
Chloride: 97 mmol/L — ABNORMAL LOW (ref 101–111)
Creatinine, Ser: 3.36 mg/dL — ABNORMAL HIGH (ref 0.44–1.00)
GFR calc Af Amer: 16 mL/min — ABNORMAL LOW (ref >60)
GFR calc non Af Amer: 14 mL/min — ABNORMAL LOW (ref >60)
Glucose, Bld: 162 mg/dL — ABNORMAL HIGH (ref 65–99)
Phosphorus: 6 mg/dL — ABNORMAL HIGH (ref 2.5–4.6)
Potassium: 4.9 mmol/L (ref 3.5–5.1)
Sodium: 133 mmol/L — ABNORMAL LOW (ref 135–145)

## 2015-06-24 LAB — CARBOXYHEMOGLOBIN
Carboxyhemoglobin: 1.5 % (ref 0.5–1.5)
Methemoglobin: 1.4 % (ref 0.0–1.5)
O2 Saturation: 60.8 %
Total hemoglobin: 9.7 g/dL — ABNORMAL LOW (ref 12.0–16.0)

## 2015-06-24 MED ORDER — DEXTROSE 5 % IV SOLN
120.0000 mg | Freq: Once | INTRAVENOUS | Status: AC
  Administered 2015-06-24: 120 mg via INTRAVENOUS
  Filled 2015-06-24: qty 12

## 2015-06-24 NOTE — Progress Notes (Signed)
Advanced Heart Failure Rounding Note   Subjective:    Admitted from HF clinic with increased dyspnea and volume overload.  On 6/10 underwent swan and IABP placement for persistent cardiogenic shock with co-ox in 40s and worsening renal failure. Now on CVVHD (started on 6/11) Remains on  dobutamine 5 and levophed 10 .   CVVHD stopped 6/18. CR 1.9-> 2.8-->3.65>3.93 >3.8>3.4 IABP out 6/24  Remains on norepi 15 mcg + Dob 5 mcg. Co-ox 61%. CVP 8. Good diuresis with IV lasix 160IV bid yesterday. Weight down 6 pounds.   Feels ok. Denies pain or dyspnea. Very weak. Two person full assist to get to chair.   Echo LVEF 10% RV moderately HK. Severe MR/TR  Objective:   Weight Range:  Vital Signs:   Temp:  [97.7 F (36.5 C)-98.3 F (36.8 C)] 97.9 F (36.6 C) (06/24 0700) Pulse Rate:  [108-113] 111 (06/24 0700) Resp:  [14-34] 19 (06/24 0700) BP: (98-131)/(51-80) 102/73 mmHg (06/24 0700) SpO2:  [91 %-100 %] 100 % (06/24 0700) Weight:  [67.5 kg (148 lb 13 oz)] 67.5 kg (148 lb 13 oz) (06/24 0700) Last BM Date: 06/22/15  Weight change: Filed Weights   06/22/15 0500 06/23/15 0500 06/24/15 0700  Weight: 70.4 kg (155 lb 3.3 oz) 69.9 kg (154 lb 1.6 oz) 67.5 kg (148 lb 13 oz)    Intake/Output:   Intake/Output Summary (Last 24 hours) at 06/24/15 0945 Last data filed at 06/24/15 0800  Gross per 24 hour  Intake 816.67 ml  Output   1605 ml  Net -788.33 ml     PHYSICAL EXAM: General: Sitting in chair HEENT: normal Neck: supple. JVP 10. Carotids 2+ bilaterally; no bruits. No lymphadenopathy or thryomegaly appreciated. Cor: PMI normal. Regular No rubs,2/6 MR/TR +S3 Lungs: clear Abdomen: soft, tender, nondistended. No hepatosplenomegaly. No bruits or masses. Good bowel sounds. Extremities: no cyanosis, clubbing, rash, noedema, back. R and L pedal pulse 2+   Neurro: alert & orientedx3, cranial nerves grossly intact. Moves all 4 extremities w/o difficulty. Affect pleasant+tremor           Telemetry: Sinus low 100s  Labs: Basic Metabolic Panel:  Recent Labs Lab 06/20/15 0518 06/21/15 0435 06/22/15 0445 06/22/15 1500 06/23/15 0436 06/24/15 0554  NA 129* 128* 130* 131* 133* 133*  K 5.6* 5.5* 5.5* 5.4* 5.1 4.9  CL 97* 97* 96* 99* 99* 97*  CO2 GLUCOSE 141* 128* 141* 140* 108* 162*  BUN 34* 46* 52* 57* 58* 66*  CREATININE 3.65* 3.93* 3.89* 3.82* 3.48* 3.36*  CALCIUM 9.9 9.9 10.4* 10.4* 10.7* 10.7*  PHOS 2.4* 3.4 4.2  --  4.7* 6.0*    Liver Function Tests:  Recent Labs Lab 06/20/15 0518 06/21/15 0435 06/22/15 0445 06/23/15 0436 06/24/15 0554  ALBUMIN 2.8* 3.0* 2.9* 3.0* 3.0*   No results for input(s): LIPASE, AMYLASE in the last 168 hours. No results for input(s): AMMONIA in the last 168 hours.  CBC:  Recent Labs Lab 06/21/15 0435 06/22/15 0445 06/23/15 0436 06/23/15 0800 06/23/15 1305 06/24/15 0449  WBC 5.5 5.0 4.8  --  5.3 5.1  HGB 10.2* 9.8* 10.0* 9.4* 9.5* 9.3*  HCT 30.5* 29.3* 29.8* 28.0* 28.0* 27.7*  MCV 66.0* 65.0* 65.9*  --  64.8* 64.4*  PLT 107* 114* 127*  --  115* 187    Cardiac Enzymes: No results for input(s): CKTOTAL, CKMB, CKMBINDEX, TROPONINI in the last 168 hours.  BNP: BNP (last 3 results)  Recent Labs  03/15/15  2150 03/22/15 1601 06/14/2015 1800  BNP 701.4* 930.9* 1217.7*    ProBNP (last 3 results)  Recent Labs  11/08/14 1800  PROBNP 2681.0*      Other results:  Imaging: Dg Chest Port 1 View  06/23/2015   CLINICAL DATA:  Congestive heart failure.  EXAM: PORTABLE CHEST - 1 VIEW  COMPARISON:  06/21/2015.  FINDINGS: Right IJ line and balloon pump in stable position. Cardiac pacer in stable position. Severe cardiomegaly with bilateral pulmonary venous congestion and pulmonary infiltrates. Bilateral pleural effusions. These findings are consistent with severe congestive heart failure. Similar findings noted on prior exam. No pneumothorax .  IMPRESSION: 1. Right IJ line and balloon pump in  stable position. 2. Cardiac pacer stable position. Severe cardiomegaly and congestive heart failure with bilateral pulmonary edema and pleural effusions again noted.   Electronically Signed   By: Maisie Fus  Register   On: 06/23/2015 09:11     Medications:     Scheduled Medications: . antiseptic oral rinse  7 mL Mouth Rinse BID  . calcitRIOL  0.25 mcg Oral Daily  . cefTRIAXone (ROCEPHIN)  IV  1 g Intravenous Q24H  . DULoxetine  60 mg Oral Daily  . enoxaparin (LOVENOX) injection  30 mg Subcutaneous Q24H  . feeding supplement (ENSURE ENLIVE)  237 mL Oral BID  . ferrous sulfate  325 mg Oral TID WC  . gabapentin  200 mg Oral QHS  . pantoprazole  40 mg Oral Daily  . rOPINIRole  2 mg Oral QHS  . sodium chloride  10-40 mL Intracatheter Q12H  . sodium chloride  3 mL Intravenous Q12H  . THROMBI-PAD  1 each Topical Once  . zolpidem  5 mg Oral QHS    Infusions: . sodium chloride 250 mL (06/16/15 0600)  . DOBUTamine 5 mcg/kg/min (06/23/15 1712)  . norepinephrine (LEVOPHED) Adult infusion 15 mcg/min (06/23/15 1913)    PRN Medications: sodium chloride, acetaminophen, fentaNYL (SUBLIMAZE) injection, guaiFENesin-dextromethorphan, nitroGLYCERIN, ondansetron (ZOFRAN) IV, ondansetron, oxybutynin, polyethylene glycol, sodium chloride, sodium chloride, sodium chloride, traMADol-acetaminophen   Assessment/Plan    1. 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 CAD S/P PCI with DES to prox RCA and stent to distal RCA in 2012. No s/s of ischemia.  3. PSVT - quiescent. No longer on amio 4. Acute on chronic renal failure, stage IV 5. Hyperkalemia 6. Acute blood loss anemia  7. UTI  Renal function improving slowly but perhaps slightly uremic today. CVP ok. Will hold lasix today. Likely restart tomorrow.   Co-ox good. Would continue dual inotropes at this point to continue so support.   She is a very long shot for VAD but will continue to support hemodynamically and  have PT see (she is extremely deconditioned). If creatinine gets under 2 and she can get stronger we can re-evaluate for mechanical support (RV quite tenuous).   Treating UTI with rocephin x 7 days (no urine cx sent).   The patient is critically ill with multiple organ systems failure and requires high complexity decision making for assessment and support, frequent evaluation and titration of therapies, application of advanced monitoring technologies and extensive interpretation of multiple databases.   Critical Care Time devoted to patient care services described in this note is 50 Minutes.  Ellie Bryand,MD 9:45 AM Advanced Heart Failure Team Pager [phone] (M-F; 7a - 4p)

## 2015-06-24 NOTE — Evaluation (Signed)
Physical Therapy Evaluation Patient Details Name: Andrea [name] MRN: [id number] DOB: [birth year] Today's Date: 06/24/2015   History of Present Illness  Admitted from HF clinic with increased dyspnea and volume overload. Since admission cardiogenic shock, CVVHD, AMS  Clinical Impression  Pt pleasant but confused stating it is July of 2017 and even with cueing for calendar use states June 64 and required 3 trials with looking at calendar to state correct date. Pt was mod I for all mobility at home per her report and has family for intermittent assist. Pt with AMS, poor balance, posterior lean, maintained flexed crouched posture with gait and decreased mobility who will benefit from acute therapy to maximize mobility, strength and function to decrease burden of care and return pt to PLOF. Currently recommend 2 person assist for all mobility and safety.     Follow Up Recommendations SNF;Supervision/Assistance - 24 hour    Equipment Recommendations  3in1 (PT)    Recommendations for Other Services OT consult     Precautions / Restrictions Precautions Precautions: Fall      Mobility  Bed Mobility               General bed mobility comments: in recliner on arrival  Transfers Overall transfer level: Needs assistance   Transfers: Sit to/from Stand;Stand Pivot Transfers Sit to Stand: Mod assist Stand pivot transfers: Max assist       General transfer comment: mod assist to stand from recliner and BSC with max cues and hand over hand placement for grasping surface. Pivot recliner to [facility] with RW with max assist secondary to posterior lean, maintained hip flexion and decreased coordination. Pt stood from [facility] with mod assist RN assist for pericare and 2 person assist for safety mod assist with RW to take a step forward with max cueing for hip extension and anterior translation with chair pulled to pt. Max assist to scoot in chair  Ambulation/Gait                Stairs            Wheelchair Mobility    Modified Rankin (Stroke Patients Only)       Balance Overall balance assessment: Needs assistance   Sitting balance-Leahy Scale: Fair       Standing balance-Leahy Scale: Poor                               Pertinent Vitals/Pain Pain Assessment: 0-10 Pain Score: 7  Pain Location: abdomen Pain Descriptors / Indicators: Aching;Cramping Pain Intervention(s): Repositioned  HR 111 sats 93% on RA     Home Living Family/patient expects to be discharged to:: Private residence Living Arrangements: Spouse/significant other;Children Available Help at Discharge: Family;Available PRN/intermittently Type of Home: House Home Access: Stairs to enter   Entrance Stairs-Number of Steps: 1 Home Layout: One level Home Equipment: Walker - 2 wheels;Walker - 4 wheels;Cane - single point;Shower seat Additional Comments: significant other works son is 79 yo    Prior Function Level of Independence: Independent with assistive device(s)               Hand Dominance        Extremity/Trunk Assessment   Upper Extremity Assessment: Generalized weakness           Lower Extremity Assessment: Generalized weakness;RLE deficits/detail;LLE deficits/detail RLE Deficits / Details: hip flexion, knee flexion and extension all 3/5 bil LE LLE Deficits / Details: hip flexion, knee  flexion and extension all 3/5 bil LE  Cervical / Trunk Assessment: Kyphotic  Communication   Communication: No difficulties  Cognition Arousal/Alertness: Awake/alert Behavior During Therapy: Flat affect Overall Cognitive Status: Impaired/Different from baseline Area of Impairment: Orientation;Safety/judgement;Problem solving Orientation Level: Time;Situation   Memory: Decreased short-term memory   Safety/Judgement: Decreased awareness of deficits;Decreased awareness of safety   Problem Solving: Slow processing;Decreased initiation;Difficulty  sequencing;Requires verbal cues      General Comments      Exercises General Exercises - Lower Extremity Long Arc Quad: AROM;Seated;Both;10 reps Hip Flexion/Marching: AROM;Seated;Both;10 reps      Assessment/Plan    PT Assessment    PT Diagnosis Difficulty walking;Generalized weakness   PT Problem List    PT Treatment Interventions     PT Goals (Current goals can be found in the Care Plan section) Acute Rehab PT Goals Patient Stated Goal: return home PT Goal Formulation: With patient Time For Goal Achievement: 64/08/16 Potential to Achieve Goals: Fair    Frequency     Barriers to discharge        Co-evaluation               End of Session Equipment Utilized During Treatment: Gait belt Activity Tolerance: Patient tolerated treatment well Patient left: in chair;with call bell/phone within reach;with chair alarm set;with nursing/sitter in room Nurse Communication: Mobility status;Precautions         Time: 5852-7782 PT Time Calculation (min) (ACUTE ONLY): 28 min   Charges:   PT Evaluation $Initial PT Evaluation Tier I: 1 Procedure PT Treatments $Therapeutic Activity: 8-22 mins   PT G CodesDelorse Lek 06/24/2015, 12:29 PM Delaney Meigs, PT [phone]

## 2015-06-25 LAB — CARBOXYHEMOGLOBIN
Carboxyhemoglobin: 1.6 % — ABNORMAL HIGH (ref 0.5–1.5)
METHEMOGLOBIN: 1.1 % (ref 0.0–1.5)
O2 SAT: 66.9 %
Total hemoglobin: 9.4 g/dL — ABNORMAL LOW (ref 12.0–16.0)

## 2015-06-25 LAB — RENAL FUNCTION PANEL
Albumin: 3 g/dL — ABNORMAL LOW (ref 3.5–5.0)
Anion gap: 10 (ref 5–15)
BUN: 75 mg/dL — ABNORMAL HIGH (ref 6–20)
CALCIUM: 10.3 mg/dL (ref 8.9–10.3)
CHLORIDE: 95 mmol/L — AB (ref 101–111)
CO2: 25 mmol/L (ref 22–32)
Creatinine, Ser: 3.31 mg/dL — ABNORMAL HIGH (ref 0.44–1.00)
GFR calc Af Amer: 16 mL/min — ABNORMAL LOW (ref >60)
GFR, EST NON AFRICAN AMERICAN: 14 mL/min — AB (ref >60)
GLUCOSE: 220 mg/dL — AB (ref 65–99)
Phosphorus: 5.1 mg/dL — ABNORMAL HIGH (ref 2.5–4.6)
Potassium: 5 mmol/L (ref 3.5–5.1)
SODIUM: 130 mmol/L — AB (ref 135–145)

## 2015-06-25 LAB — CBC
HEMATOCRIT: 27.6 % — AB (ref 36.0–46.0)
Hemoglobin: 9.3 g/dL — ABNORMAL LOW (ref 12.0–15.0)
MCH: 21.7 pg — ABNORMAL LOW (ref 26.0–34.0)
MCHC: 33.7 g/dL (ref 30.0–36.0)
MCV: 64.3 fL — AB (ref 78.0–100.0)
PLATELETS: 155 10*3/uL (ref 150–400)
RBC: 4.29 MIL/uL (ref 3.87–5.11)
RDW: 20 % — AB (ref 11.5–15.5)
WBC: 5.3 10*3/uL (ref 4.0–10.5)

## 2015-06-25 MED ORDER — FUROSEMIDE 10 MG/ML IJ SOLN
120.0000 mg | Freq: Two times a day (BID) | INTRAVENOUS | Status: DC
  Administered 2015-06-25 – 2015-06-27 (×5): 120 mg via INTRAVENOUS
  Filled 2015-06-25 (×7): qty 12

## 2015-06-25 NOTE — Progress Notes (Signed)
Patient ID: Andrea [name], female   DOB: [birth year], 64 y.o.   MRN: [id number] Advanced Heart Failure Rounding Note   Subjective:    Admitted from HF clinic with increased dyspnea and volume overload.  On 6/10 underwent swan and IABP placement for persistent cardiogenic shock with co-ox in 40s and worsening renal failure. Now on CVVHD (started on 6/11) Remains on  dobutamine 5 and levophed 10 .   CVVHD stopped 6/18. CR 1.9-> 2.8-->3.65>3.93 >3.8>3.4 > 3.36 > 3.31 IABP out 6/24  Remains on norepi 15 mcg + Dob 5 mcg. Co-ox 68%. CVP 12-13. Got Lasix 120 IV x 1 yesterday.   Feels ok. Denies pain or dyspnea. Very weak. Two person full assist to get to chair.   Echo LVEF 10% RV moderately HK. Severe MR/TR  Objective:   Weight Range:  Vital Signs:   Temp:  [97.9 F (36.6 C)-98.3 F (36.8 C)] 98 F (36.7 C) (06/25 0400) Pulse Rate:  [108-115] 108 (06/24 1218) Resp:  [16-29] 16 (06/25 0700) BP: (90-113)/(50-74) 90/70 mmHg (06/25 0700) SpO2:  [90 %-100 %] 100 % (06/25 0700) Weight:  [148 lb 13 oz (67.5 kg)] 148 lb 13 oz (67.5 kg) (06/25 0411) Last BM Date: 06/24/15  Weight change: Filed Weights   06/23/15 0500 06/24/15 0700 06/25/15 0411  Weight: 154 lb 1.6 oz (69.9 kg) 148 lb 13 oz (67.5 kg) 148 lb 13 oz (67.5 kg)    Intake/Output:   Intake/Output Summary (Last 24 hours) at 06/25/15 0729 Last data filed at 06/25/15 0700  Gross per 24 hour  Intake   1552 ml  Output    920 ml  Net    632 ml     PHYSICAL EXAM: General: Sitting in chair HEENT: normal Neck: supple. JVP 12. Carotids 2+ bilaterally; no bruits. No lymphadenopathy or thryomegaly appreciated. Cor: PMI normal. Regular No rubs,3/6 MR/TR +S3 Lungs: clear Abdomen: soft, tender, nondistended. No hepatosplenomegaly. No bruits or masses. Good bowel sounds. Extremities: no cyanosis, clubbing, rash, noedema, back. R and L pedal pulse 2+   Neurro: alert & orientedx3, cranial nerves grossly intact. Moves all 4  extremities w/o difficulty. Affect pleasant+tremor          Telemetry: Sinus low 110s  Labs: Basic Metabolic Panel:  Recent Labs Lab 06/21/15 0435 06/22/15 0445 06/22/15 1500 06/23/15 0436 06/24/15 0554 06/25/15 0407  NA 128* 130* 131* 133* 133* 130*  K 5.5* 5.5* 5.4* 5.1 4.9 5.0  CL 97* 96* 99* 99* 97* 95*  CO2 24 23 24 26 26 25   GLUCOSE 128* 141* 140* 108* 162* 220*  BUN 46* 52* 57* 58* 66* 75*  CREATININE 3.93* 3.89* 3.82* 3.48* 3.36* 3.31*  CALCIUM 9.9 10.4* 10.4* 10.7* 10.7* 10.3  PHOS 3.4 4.2  --  4.7* 6.0* 5.1*    Liver Function Tests:  Recent Labs Lab 06/21/15 0435 06/22/15 0445 06/23/15 0436 06/24/15 0554 06/25/15 0407  ALBUMIN 3.0* 2.9* 3.0* 3.0* 3.0*   No results for input(s): LIPASE, AMYLASE in the last 168 hours. No results for input(s): AMMONIA in the last 168 hours.  CBC:  Recent Labs Lab 06/22/15 0445 06/23/15 0436 06/23/15 0800 06/23/15 1305 06/24/15 0449 06/25/15 0407  WBC 5.0 4.8  --  5.3 5.1 5.3  HGB 9.8* 10.0* 9.4* 9.5* 9.3* 9.3*  HCT 29.3* 29.8* 28.0* 28.0* 27.7* 27.6*  MCV 65.0* 65.9*  --  64.8* 64.4* 64.3*  PLT 114* 127*  --  115* 187 155    Cardiac Enzymes: No results for  input(s): CKTOTAL, CKMB, CKMBINDEX, TROPONINI in the last 168 hours.  BNP: BNP (last 3 results)  Recent Labs  03/15/15 2150 03/22/15 1601 06/04/2015 1800  BNP 701.4* 930.9* 1217.7*    ProBNP (last 3 results)  Recent Labs  11/08/14 1800  PROBNP 2681.0*      Other results:  Imaging: Dg Chest Port 1 View  06/23/2015   CLINICAL DATA:  Congestive heart failure.  EXAM: PORTABLE CHEST - 1 VIEW  COMPARISON:  06/21/2015.  FINDINGS: Right IJ line and balloon pump in stable position. Cardiac pacer in stable position. Severe cardiomegaly with bilateral pulmonary venous congestion and pulmonary infiltrates. Bilateral pleural effusions. These findings are consistent with severe congestive heart failure. Similar findings noted on prior exam. No  pneumothorax .  IMPRESSION: 1. Right IJ line and balloon pump in stable position. 2. Cardiac pacer stable position. Severe cardiomegaly and congestive heart failure with bilateral pulmonary edema and pleural effusions again noted.   Electronically Signed   By: Maisie Fus  Register   On: 06/23/2015 09:11     Medications:     Scheduled Medications: . antiseptic oral rinse  7 mL Mouth Rinse BID  . calcitRIOL  0.25 mcg Oral Daily  . cefTRIAXone (ROCEPHIN)  IV  1 g Intravenous Q24H  . DULoxetine  60 mg Oral Daily  . enoxaparin (LOVENOX) injection  30 mg Subcutaneous Q24H  . feeding supplement (ENSURE ENLIVE)  237 mL Oral BID  . ferrous sulfate  325 mg Oral TID WC  . furosemide  120 mg Intravenous BID  . gabapentin  200 mg Oral QHS  . pantoprazole  40 mg Oral Daily  . rOPINIRole  2 mg Oral QHS  . sodium chloride  10-40 mL Intracatheter Q12H  . sodium chloride  3 mL Intravenous Q12H  . zolpidem  5 mg Oral QHS    Infusions: . sodium chloride 250 mL (06/16/15 0600)  . DOBUTamine 5 mcg/kg/min (06/24/15 2000)  . norepinephrine (LEVOPHED) Adult infusion 14 mcg/min (06/24/15 2000)    PRN Medications: sodium chloride, acetaminophen, fentaNYL (SUBLIMAZE) injection, guaiFENesin-dextromethorphan, nitroGLYCERIN, ondansetron (ZOFRAN) IV, ondansetron, oxybutynin, polyethylene glycol, sodium chloride, sodium chloride, sodium chloride, traMADol-acetaminophen   Assessment/Plan    1. 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 CAD S/P PCI with DES to prox RCA and stent to distal RCA in 2012. No s/s of ischemia.  3. PSVT - quiescent. No longer on amio 4. Acute on chronic renal failure, stage IV 5. Hyperkalemia 6. Acute blood loss anemia  7. UTI  Renal function stable today. CVP elevated 12-13, got Lasix last night. Restart Lasix 120 mg IV bid today.   Co-ox good. Continue dobutamine and will try to slowly wean down down norepinephrine for MAP 65, SBP >/= 90.     She is a very long shot for VAD but will continue to support hemodynamically and have PT see (she is extremely deconditioned). If creatinine gets under 2 and she can get stronger we can re-evaluate for mechanical support (RV quite tenuous).   Treating UTI with rocephin x 7 days (no urine cx sent).   The patient is critically ill with multiple organ systems failure and requires high complexity decision making for assessment and support, frequent evaluation and titration of therapies, application of advanced monitoring technologies and extensive interpretation of multiple databases.   Critical Care Time devoted to patient care services described in this note is 35 Minutes.  Breslin Hemann,MD 7:29 AM Advanced Heart Failure Team Pager [phone] (M-F; 7a -  4p)

## 2015-06-26 LAB — CBC
HCT: 27.8 % — ABNORMAL LOW (ref 36.0–46.0)
Hemoglobin: 9.3 g/dL — ABNORMAL LOW (ref 12.0–15.0)
MCH: 21.8 pg — ABNORMAL LOW (ref 26.0–34.0)
MCHC: 33.5 g/dL (ref 30.0–36.0)
MCV: 65.1 fL — AB (ref 78.0–100.0)
Platelets: 176 10*3/uL (ref 150–400)
RBC: 4.27 MIL/uL (ref 3.87–5.11)
RDW: 20.2 % — AB (ref 11.5–15.5)
WBC: 4.3 10*3/uL (ref 4.0–10.5)

## 2015-06-26 LAB — CARBOXYHEMOGLOBIN
Carboxyhemoglobin: 1 % (ref 0.5–1.5)
Carboxyhemoglobin: 1.1 % (ref 0.5–1.5)
METHEMOGLOBIN: 0.9 % (ref 0.0–1.5)
Methemoglobin: 0.9 % (ref 0.0–1.5)
O2 Saturation: 46.7 %
O2 Saturation: 52.5 %
Total hemoglobin: 9.9 g/dL — ABNORMAL LOW (ref 12.0–16.0)
Total hemoglobin: 9.6 g/dL — ABNORMAL LOW (ref 12.0–16.0)

## 2015-06-26 LAB — ALBUMIN: ALBUMIN: 2.9 g/dL — AB (ref 3.5–5.0)

## 2015-06-26 LAB — BASIC METABOLIC PANEL
Anion gap: 12 (ref 5–15)
BUN: 78 mg/dL — AB (ref 6–20)
CO2: 24 mmol/L (ref 22–32)
Calcium: 9.9 mg/dL (ref 8.9–10.3)
Chloride: 95 mmol/L — ABNORMAL LOW (ref 101–111)
Creatinine, Ser: 3.11 mg/dL — ABNORMAL HIGH (ref 0.44–1.00)
GFR, EST AFRICAN AMERICAN: 17 mL/min — AB (ref >60)
GFR, EST NON AFRICAN AMERICAN: 15 mL/min — AB (ref >60)
GLUCOSE: 148 mg/dL — AB (ref 65–99)
Potassium: 4.4 mmol/L (ref 3.5–5.1)
SODIUM: 131 mmol/L — AB (ref 135–145)

## 2015-06-26 LAB — PHOSPHORUS: Phosphorus: 5 mg/dL — ABNORMAL HIGH (ref 2.5–4.6)

## 2015-06-26 NOTE — Progress Notes (Signed)
Patient ID: Andrea [name], female   DOB: [birth year], 64 y.o.   MRN: [id number] Advanced Heart Failure Rounding Note   Subjective:    Admitted from HF clinic with increased dyspnea and volume overload.  On 6/10 underwent swan and IABP placement for persistent cardiogenic shock with co-ox in 40s and worsening renal failure. Now on CVVHD (started on 6/11) Remains on  dobutamine 5 and levophed 10 .   CVVHD stopped 6/18. CR 1.9-> 2.8-->3.65>3.93 >3.8>3.4 > 3.36 > 3.31 > 3.11 IABP out 6/24  Remains on norepi 11 mcg + Dob 5 mcg. Co-ox 68% > 44%. CVP 8-9 this morning. Getting IV Lasix, I/Os basically even yesterday.   Did not sleep well last night.  Sleepy but answers questions appropriately today.  PT did not come yesterday.   Echo LVEF 10% RV moderately HK. Severe MR/TR  Objective:   Weight Range:  Vital Signs:   Temp:  [97 F (36.1 C)-98.2 F (36.8 C)] 97.3 F (36.3 C) (06/26 0725) Pulse Rate:  [112] 112 (06/26 0725) Resp:  [14-36] 16 (06/26 0725) BP: (88-109)/(50-77) 101/64 mmHg (06/26 0725) SpO2:  [91 %-100 %] 100 % (06/26 0725) Weight:  [149 lb 0.5 oz (67.6 kg)] 149 lb 0.5 oz (67.6 kg) (06/26 0454) Last BM Date: 06/25/15  Weight change: Filed Weights   06/24/15 0700 06/25/15 0411 06/26/15 0454  Weight: 148 lb 13 oz (67.5 kg) 148 lb 13 oz (67.5 kg) 149 lb 0.5 oz (67.6 kg)    Intake/Output:   Intake/Output Summary (Last 24 hours) at 06/26/15 0732 Last data filed at 06/26/15 0700  Gross per 24 hour  Intake   1307 ml  Output   1505 ml  Net   -198 ml     PHYSICAL EXAM: General: Sitting in chair HEENT: normal Neck: supple. JVP 8. Carotids 2+ bilaterally; no bruits. No lymphadenopathy or thryomegaly appreciated. Cor: PMI normal. Regular No rubs, 3/6 MR/TR, +S3 Lungs: clear Abdomen: soft, tender, nondistended. No hepatosplenomegaly. No bruits or masses. Good bowel sounds. Extremities: no cyanosis, clubbing, rash, noedema, back. R and L pedal pulse 2+   Neurro:  alert & orientedx3, cranial nerves grossly intact. Moves all 4 extremities w/o difficulty. Affect pleasant+tremor          Telemetry: Sinus 100s-110s  Labs: Basic Metabolic Panel:  Recent Labs Lab 06/22/15 0445 06/22/15 1500 06/23/15 0436 06/24/15 0554 06/25/15 0407 06/26/15 0502  NA 130* 131* 133* 133* 130* 131*  K 5.5* 5.4* 5.1 4.9 5.0 4.4  CL 96* 99* 99* 97* 95* 95*  CO2 GLUCOSE 141* 140* 108* 162* 220* 148*  BUN 52* 57* 58* 66* 75* 78*  CREATININE 3.89* 3.82* 3.48* 3.36* 3.31* 3.11*  CALCIUM 10.4* 10.4* 10.7* 10.7* 10.3 9.9  PHOS 4.2  --  4.7* 6.0* 5.1* 5.0*    Liver Function Tests:  Recent Labs Lab 06/22/15 0445 06/23/15 0436 06/24/15 0554 06/25/15 0407 06/26/15 0502  ALBUMIN 2.9* 3.0* 3.0* 3.0* 2.9*   No results for input(s): LIPASE, AMYLASE in the last 168 hours. No results for input(s): AMMONIA in the last 168 hours.  CBC:  Recent Labs Lab 06/23/15 0436 06/23/15 0800 06/23/15 1305 06/24/15 0449 06/25/15 0407 06/26/15 0502  WBC 4.8  --  5.3 5.1 5.3 4.3  HGB 10.0* 9.4* 9.5* 9.3* 9.3* 9.3*  HCT 29.8* 28.0* 28.0* 27.7* 27.6* 27.8*  MCV 65.9*  --  64.8* 64.4* 64.3* 65.1*  PLT 127*  --  115* 187 155 176  Cardiac Enzymes: No results for input(s): CKTOTAL, CKMB, CKMBINDEX, TROPONINI in the last 168 hours.  BNP: BNP (last 3 results)  Recent Labs  03/15/15 2150 03/22/15 1601 23-Jun-2015 1800  BNP 701.4* 930.9* 1217.7*    ProBNP (last 3 results)  Recent Labs  11/08/14 1800  PROBNP 2681.0*      Other results:  Imaging: No results found.   Medications:     Scheduled Medications: . antiseptic oral rinse  7 mL Mouth Rinse BID  . calcitRIOL  0.25 mcg Oral Daily  . cefTRIAXone (ROCEPHIN)  IV  1 g Intravenous Q24H  . DULoxetine  60 mg Oral Daily  . enoxaparin (LOVENOX) injection  30 mg Subcutaneous Q24H  . feeding supplement (ENSURE ENLIVE)  237 mL Oral BID  . ferrous sulfate  325 mg Oral TID WC  .  furosemide  120 mg Intravenous BID  . gabapentin  200 mg Oral QHS  . pantoprazole  40 mg Oral Daily  . rOPINIRole  2 mg Oral QHS  . sodium chloride  10-40 mL Intracatheter Q12H  . sodium chloride  3 mL Intravenous Q12H  . zolpidem  5 mg Oral QHS    Infusions: . sodium chloride 250 mL (06/16/15 0600)  . DOBUTamine 5 mcg/kg/min (06/25/15 1540)  . norepinephrine (LEVOPHED) Adult infusion 11 mcg/min (06/26/15 0430)    PRN Medications: sodium chloride, acetaminophen, fentaNYL (SUBLIMAZE) injection, guaiFENesin-dextromethorphan, nitroGLYCERIN, ondansetron (ZOFRAN) IV, ondansetron, oxybutynin, polyethylene glycol, sodium chloride, sodium chloride, sodium chloride, traMADol-acetaminophen   Assessment/Plan    1. 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 CAD S/P PCI with DES to prox RCA and stent to distal RCA in 2012. No s/s of ischemia.  3. PSVT - quiescent. No longer on amio 4. Acute on chronic renal failure, stage IV 5. Hyperkalemia 6. Acute blood loss anemia  7. UTI  Creatinine slightly lower today but BUN up. CVP better today at 8-9.  I/Os even with Lasix 120 IV bid yesterday. Will continue current Lasix today.   Co-ox lower this morning. Only change was small decrease in norepinephrine from 15 => 11.  Will not wean norepinephrine further today.  Will resend co-ox this morning, may increase dobutamine if remains low.  Out of bed to chair.    She is a very long shot for VAD but will continue to support hemodynamically and work with PT (she is extremely deconditioned). If creatinine gets under 2 and she can get stronger we can re-evaluate for mechanical support (RV quite tenuous).   Treating UTI with rocephin x 7 days (no urine cx sent).   The patient is critically ill with multiple organ systems failure and requires high complexity decision making for assessment and support, frequent evaluation and titration of therapies, application of advanced  monitoring technologies and extensive interpretation of multiple databases.   Critical Care Time devoted to patient care services described in this note is 35 Minutes.  Bettie Swavely,MD 7:32 AM Advanced Heart Failure Team Pager [phone] (M-F; 7a - 4p)

## 2015-06-27 LAB — RENAL FUNCTION PANEL
Albumin: 2.9 g/dL — ABNORMAL LOW (ref 3.5–5.0)
Anion gap: 12 (ref 5–15)
BUN: 77 mg/dL — ABNORMAL HIGH (ref 6–20)
CALCIUM: 10 mg/dL (ref 8.9–10.3)
CHLORIDE: 91 mmol/L — AB (ref 101–111)
CO2: 24 mmol/L (ref 22–32)
CREATININE: 3.35 mg/dL — AB (ref 0.44–1.00)
GFR calc Af Amer: 16 mL/min — ABNORMAL LOW (ref >60)
GFR, EST NON AFRICAN AMERICAN: 14 mL/min — AB (ref >60)
Glucose, Bld: 311 mg/dL — ABNORMAL HIGH (ref 65–99)
Phosphorus: 5.5 mg/dL — ABNORMAL HIGH (ref 2.5–4.6)
Potassium: 4.2 mmol/L (ref 3.5–5.1)
Sodium: 127 mmol/L — ABNORMAL LOW (ref 135–145)

## 2015-06-27 LAB — GLUCOSE, CAPILLARY
GLUCOSE-CAPILLARY: 151 mg/dL — AB (ref 65–99)
Glucose-Capillary: 136 mg/dL — ABNORMAL HIGH (ref 65–99)
Glucose-Capillary: 121 mg/dL — ABNORMAL HIGH (ref 65–99)
Glucose-Capillary: 97 mg/dL (ref 65–99)

## 2015-06-27 LAB — CBC
HCT: 28.2 % — ABNORMAL LOW (ref 36.0–46.0)
Hemoglobin: 9.4 g/dL — ABNORMAL LOW (ref 12.0–15.0)
MCH: 21.8 pg — ABNORMAL LOW (ref 26.0–34.0)
MCHC: 33.3 g/dL (ref 30.0–36.0)
MCV: 65.3 fL — ABNORMAL LOW (ref 78.0–100.0)
PLATELETS: 201 10*3/uL (ref 150–400)
RBC: 4.32 MIL/uL (ref 3.87–5.11)
RDW: 20.1 % — ABNORMAL HIGH (ref 11.5–15.5)
WBC: 4.5 10*3/uL (ref 4.0–10.5)

## 2015-06-27 LAB — CARBOXYHEMOGLOBIN
CARBOXYHEMOGLOBIN: 1.1 % (ref 0.5–1.5)
CARBOXYHEMOGLOBIN: 1.4 % (ref 0.5–1.5)
Methemoglobin: 0.7 % (ref 0.0–1.5)
Methemoglobin: 1.5 % (ref 0.0–1.5)
O2 SAT: 47.8 %
O2 SAT: 50.5 %
Total hemoglobin: 9.8 g/dL — ABNORMAL LOW (ref 12.0–16.0)
Total hemoglobin: 9.7 g/dL — ABNORMAL LOW (ref 12.0–16.0)

## 2015-06-27 MED ORDER — DOBUTAMINE IN D5W 4-5 MG/ML-% IV SOLN
5.0000 ug/kg/min | INTRAVENOUS | Status: DC
  Administered 2015-06-28 – 2015-07-01 (×3): 7 ug/kg/min via INTRAVENOUS
  Administered 2015-07-02: 5 ug/kg/min via INTRAVENOUS
  Filled 2015-06-27 (×4): qty 250

## 2015-06-27 MED ORDER — INSULIN ASPART 100 UNIT/ML ~~LOC~~ SOLN
0.0000 [IU] | SUBCUTANEOUS | Status: DC
  Administered 2015-06-27 (×2): 1 [IU] via SUBCUTANEOUS
  Administered 2015-06-27: 2 [IU] via SUBCUTANEOUS
  Administered 2015-06-28: 1 [IU] via SUBCUTANEOUS
  Administered 2015-06-28 – 2015-06-30 (×4): 2 [IU] via SUBCUTANEOUS
  Administered 2015-06-30: 1 [IU] via SUBCUTANEOUS
  Administered 2015-06-30 – 2015-07-01 (×3): 2 [IU] via SUBCUTANEOUS
  Administered 2015-07-02: 1 [IU] via SUBCUTANEOUS
  Administered 2015-07-02: 2 [IU] via SUBCUTANEOUS
  Administered 2015-07-02: 1 [IU] via SUBCUTANEOUS
  Administered 2015-07-02 (×2): 2 [IU] via SUBCUTANEOUS
  Administered 2015-07-02 (×2): 1 [IU] via SUBCUTANEOUS
  Administered 2015-07-03: 2 [IU] via SUBCUTANEOUS

## 2015-06-27 NOTE — Progress Notes (Signed)
Inpatient Diabetes Program Recommendations  AACE/ADA: New Consensus Statement on Inpatient Glycemic Control (2013)  Target Ranges:  Prepandial:   less than 140 mg/dL      Peak postprandial:   less than 180 mg/dL (1-2 hours)      Critically ill patients:  140 - 180 mg/dL   Results for KHALAYA, KROH (MRN [id number]) as of 06/27/2015 07:52  Ref. Range 06/25/2015 04:07 06/26/2015 05:02 06/27/2015 04:40  Glucose Latest Ref Range: 65-99 mg/dL 863 (H) 817 (H) 711 (H)    Diabetes history: DM2 Outpatient Diabetes medications: None Current orders for Inpatient glycemic control: None  Inpatient Diabetes Program Recommendations Correction (SSI): Patient has a history of diabetes and glucose 311 mg/dl this morning at 6:57. Please consider ordering CBGs and Novolog sensitive correction scale Q4H. HgbA1C: Last A1C in the chart was 6.4% on 03/23/2015.  Thanks, Orlando Penner, RN, MSN, CCRN, CDE Diabetes Coordinator Inpatient Diabetes Program [phone] (Team Pager from 8am to 5pm) [phone] ([facility] office) [phone] [facility] office) [phone] [facility] office)

## 2015-06-27 NOTE — Evaluation (Signed)
Occupational Therapy Evaluation Patient Details Name: Andrea [name] MRN: [id number] DOB: 08/01/1951 Today's Date: 06/27/2015    History of Present Illness Admitted from HF clinic with increased dyspnea and volume overload. Since admission cardiogenic shock, CVVHD, AMS   Clinical Impression   Pt was able to perform self care with DME and light meal prep prior to admission.  Pt appears lethargic and generally weak.  She presents with B UE tremor and confusion.  Pt requiring moderate assistance for bed mobility and sit to stand.  Did not transfer pt due to fatigue and lethargy.  Pt is dependent in all self care.  Will require SNF upon discharge.  Will defer further OT to SNF.   Follow Up Recommendations  SNF;Supervision/Assistance - 24 hour    Equipment Recommendations       Recommendations for Other Services       Precautions / Restrictions Precautions Precautions: Fall Restrictions Weight Bearing Restrictions: No      Mobility Bed Mobility Overal bed mobility: Needs Assistance Bed Mobility: Rolling;Sidelying to Sit;Sit to Supine Rolling: Mod assist Sidelying to sit: Mod assist   Sit to supine: Mod assist   General bed mobility comments: cues for sequence, use of rail and assist of trunk  Transfers Overall transfer level: Needs assistance   Transfers: Sit to/from Stand Sit to Stand: Mod assist        General transfer comment: did not transfer pt in the due to fatigue and requesting to lay back down    Balance Overall balance assessment: Needs assistance   Sitting balance-Leahy Scale: Fair   Postural control: Posterior lean   Standing balance-Leahy Scale: Poor                              ADL Overall ADL's : Needs assistance/impaired Eating/Feeding: Moderate assistance;Bed level   Grooming: Maximal assistance;Wash/dry hands;Wash/dry face;Bed level   Upper Body Bathing: Maximal assistance;Bed level   Lower Body Bathing: Total  assistance;Bed level   Upper Body Dressing : Moderate assistance;Bed level   Lower Body Dressing: Total assistance;Bed level                       Vision     Perception     Praxis      Pertinent Vitals/Pain Pain Assessment: No/denies pain     Hand Dominance Right   Extremity/Trunk Assessment Upper Extremity Assessment Upper Extremity Assessment: Generalized weakness (B intention tremor, 3/5 strength grossly)   Lower Extremity Assessment Lower Extremity Assessment: Defer to PT evaluation   Cervical / Trunk Assessment Cervical / Trunk Assessment: Kyphotic   Communication Communication Communication: No difficulties   Cognition Arousal/Alertness: Lethargic Behavior During Therapy: Flat affect Overall Cognitive Status: Impaired/Different from baseline Area of Impairment: Orientation;Safety/judgement;Problem solving Orientation Level: Time;Situation   Memory: Decreased short-term memory   Safety/Judgement: Decreased awareness of deficits;Decreased awareness of safety   Problem Solving: Slow processing;Decreased initiation;Difficulty sequencing;Requires verbal cues;Requires tactile cues General Comments: pt reports that her dog helps her bathe and dress   General Comments       Exercises       Shoulder Instructions      Home Living Family/patient expects to be discharged to:: Private residence Living Arrangements: Spouse/significant other;Children Available Help at Discharge: Family;Available PRN/intermittently Type of Home: House Home Access: Stairs to enter Entrance Stairs-Number of Steps: 1   Home Layout: One level     Bathroom Shower/Tub: Tub/shower unit  Bathroom Toilet: Standard     Home Equipment: Environmental consultant - 2 wheels;Walker - 4 wheels;Cane - single point;Shower seat   Additional Comments: significant other works son is 59 yo      Prior Functioning/Environment Level of Independence: Independent with assistive device(s)              OT Diagnosis: Generalized weakness;Cognitive deficits   OT Problem List:     OT Treatment/Interventions:      OT Goals(Current goals can be found in the care plan section) Acute Rehab OT Goals Patient Stated Goal: return home  OT Frequency:     Barriers to D/C:            Co-evaluation              End of Session    Activity Tolerance: Patient limited by fatigue Patient left: in bed;with call bell/phone within reach   Time: 1426-1441 OT Time Calculation (min): 15 min Charges:  OT General Charges $OT Visit: 1 Procedure OT Evaluation $Initial OT Evaluation Tier I: 1 Procedure G-Codes:    [name] 06/27/2015, 2:54 PM  [phone]

## 2015-06-27 NOTE — Progress Notes (Signed)
Patient ID: Andrea [name], female   DOB: 21-Sep-1951, 64 y.o.   MRN: [id number] Advanced Heart Failure Rounding Note   Subjective:    Admitted from HF clinic with increased dyspnea and volume overload.  On 6/10 underwent swan and IABP placement for persistent cardiogenic shock with co-ox in 40s and worsening renal failure. Now on CVVHD (started on 6/11) Remains on  dobutamine 5 and levophed 10 .   CVVHD stopped 6/18. CR 1.9-> 2.8-->3.65>3.93 >3.8>3.4 > 3.36 > 3.31 > 3.11 > 3.35 IABP out 6/24  Yesterday norepi and dobutamine increased--> CO-OX was 44%. Over night norepi increased, Remains on norepi 13 mcg + Dob 7 mcg. Co-ox 50%. CVP 10-11 this morning. Getting IV Lasix.   More confused and weak. Can barely stand even with PT support.     Echo LVEF 10% RV moderately HK. Severe MR/TR  Objective:   Weight Range:  Vital Signs:   Temp:  [97.3 F (36.3 C)-98 F (36.7 C)] 97.8 F (36.6 C) (06/27 0300) Pulse Rate:  [112-115] 115 (06/26 1100) Resp:  [16-26] 17 (06/27 0600) BP: (84-115)/(53-70) 96/68 mmHg (06/27 0600) SpO2:  [94 %-100 %] 100 % (06/27 0600) Weight:  [149 lb 7.6 oz (67.8 kg)] 149 lb 7.6 oz (67.8 kg) (06/27 0300) Last BM Date: 06/27/15  Weight change: Filed Weights   06/25/15 0411 06/26/15 0454 06/27/15 0300  Weight: 148 lb 13 oz (67.5 kg) 149 lb 0.5 oz (67.6 kg) 149 lb 7.6 oz (67.8 kg)    Intake/Output:   Intake/Output Summary (Last 24 hours) at 06/27/15 0655 Last data filed at 06/27/15 0600  Gross per 24 hour  Intake  775.8 ml  Output    940 ml  Net -164.2 ml     PHYSICAL EXAM: General: Lying in bed. Fatigued appearing.  HEENT: normal Neck: supple. JVP to jaw.  Carotids 2+ bilaterally; no bruits. No lymphadenopathy or thryomegaly appreciated. Cor: PMI normal. Regular No rubs, 3/6 MR/TR, +S3 Lungs: clear Abdomen: soft, tender, nondistended. No hepatosplenomegaly. No bruits or masses. Good bowel sounds. Extremities: no cyanosis, clubbing, rash,  noedema, back. R and L pedal pulse 2+   Neurro: alert & orientedx1, cranial nerves grossly intact. Moves all 4 extremities w/o difficulty. Affect pleasant+tremor          Telemetry: Sinus Tach 100s-140s  Labs: Basic Metabolic Panel:  Recent Labs Lab 06/23/15 0436 06/24/15 0554 06/25/15 0407 06/26/15 0502 06/27/15 0440  NA 133* 133* 130* 131* 127*  K 5.1 4.9 5.0 4.4 4.2  CL 99* 97* 95* 95* 91*  CO2 26 26 25 24 24   GLUCOSE 108* 162* 220* 148* 311*  BUN 58* 66* 75* 78* 77*  CREATININE 3.48* 3.36* 3.31* 3.11* 3.35*  CALCIUM 10.7* 10.7* 10.3 9.9 10.0  PHOS 4.7* 6.0* 5.1* 5.0* 5.5*    Liver Function Tests:  Recent Labs Lab 06/23/15 0436 06/24/15 0554 06/25/15 0407 06/26/15 0502 06/27/15 0440  ALBUMIN 3.0* 3.0* 3.0* 2.9* 2.9*   No results for input(s): LIPASE, AMYLASE in the last 168 hours. No results for input(s): AMMONIA in the last 168 hours.  CBC:  Recent Labs Lab 06/23/15 1305 06/24/15 0449 06/25/15 0407 06/26/15 0502 06/27/15 0440  WBC 5.3 5.1 5.3 4.3 4.5  HGB 9.5* 9.3* 9.3* 9.3* 9.4*  HCT 28.0* 27.7* 27.6* 27.8* 28.2*  MCV 64.8* 64.4* 64.3* 65.1* 65.3*  PLT 115* 187 155 176 201    Cardiac Enzymes: No results for input(s): CKTOTAL, CKMB, CKMBINDEX, TROPONINI in the last 168 hours.  BNP: BNP (  last 3 results)  Recent Labs  03/15/15 2150 03/22/15 1601 06/14/2015 1800  BNP 701.4* 930.9* 1217.7*    ProBNP (last 3 results)  Recent Labs  11/08/14 1800  PROBNP 2681.0*      Other results:  Imaging: No results found.   Medications:     Scheduled Medications: . antiseptic oral rinse  7 mL Mouth Rinse BID  . calcitRIOL  0.25 mcg Oral Daily  . cefTRIAXone (ROCEPHIN)  IV  1 g Intravenous Q24H  . DULoxetine  60 mg Oral Daily  . enoxaparin (LOVENOX) injection  30 mg Subcutaneous Q24H  . feeding supplement (ENSURE ENLIVE)  237 mL Oral BID  . ferrous sulfate  325 mg Oral TID WC  . furosemide  120 mg Intravenous BID  . gabapentin  200  mg Oral QHS  . pantoprazole  40 mg Oral Daily  . rOPINIRole  2 mg Oral QHS  . sodium chloride  10-40 mL Intracatheter Q12H  . sodium chloride  3 mL Intravenous Q12H  . zolpidem  5 mg Oral QHS    Infusions: . sodium chloride 250 mL (06/16/15 0600)  . DOBUTamine 7 mcg/kg/min (06/26/15 1924)  . norepinephrine (LEVOPHED) Adult infusion 13 mcg/min (06/27/15 0500)    PRN Medications: sodium chloride, acetaminophen, fentaNYL (SUBLIMAZE) injection, guaiFENesin-dextromethorphan, nitroGLYCERIN, ondansetron (ZOFRAN) IV, ondansetron, oxybutynin, polyethylene glycol, sodium chloride, sodium chloride, sodium chloride, traMADol-acetaminophen   Assessment/Plan    1. 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 CAD S/P PCI with DES to prox RCA and stent to distal RCA in 2012. No s/s of ischemia.  3. PSVT - quiescent. No longer on amio 4. Acute on chronic renal failure, stage IV 5. Hyperkalemia 6. Acute blood loss anemia  7. UTI  Creatinine increasing. CVP better today at 12-14. Continue  Lasix 120 IV bid yesterday. Will continue current Lasix today.   Co-ox 50% on Dobutamine 7 mcg and Norepi up to 13 mcg to maintain SBP 90.   She is a very long shot for VAD but will continue to support hemodynamically and work with PT (she is extremely deconditioned). If creatinine gets under 2 and she can get stronger we can re-evaluate for mechanical support (RV quite tenuous).   Treating UTI with rocephin x 5/7 days (no urine cx sent).   [name],AMY, NP-C  6:55 AM Advanced Heart Failure Team Pager [phone] (M-F; 7a - 4p)   Patient seen and examined with Tonye Becket, NP. We discussed all aspects of the encounter. I agree with the assessment and plan as stated above.   She has not made any progress despite aggressive support. Remains extremely weak and now increasingly uremic again. Renal recovery has plateaued. I do not think there is a chance that we can get her to any  durable mechanical support. I favor stopping inotropes and switching to comfort care. Will discuss with family.   The patient is critically ill with multiple organ systems failure and requires high complexity decision making for assessment and support, frequent evaluation and titration of therapies, application of advanced monitoring technologies and extensive interpretation of multiple databases.   Critical Care Time personally devoted to patient care services described in this note is 35 Minutes.  Pieter Fooks,MD 3:07 PM

## 2015-06-27 NOTE — Progress Notes (Signed)
Physical Therapy Treatment Patient Details Name: Andrea [name] MRN: [id number] DOB: 1951/02/24 Today's Date: 06/27/2015    History of Present Illness Admitted from HF clinic with increased dyspnea and volume overload. Since admission cardiogenic shock, CVVHD, AMS    PT Comments    Pt continues to have flat affect with wife present in room today. Wife stating mentation and mobility are significantly different from baseline with pt walking in home unassisted and preparing simple meals. Pt disoriented to month, day and year with lack of carryover of instruction throughout. Pt maintaining crouched posture with posterior lean in standing. Educated for HEP, transfers and progression, will continue to follow. Pt with noted RUE jerking in sitting again today with wife and pt report as not baseline.   Follow Up Recommendations  SNF;Supervision/Assistance - 24 hour     Equipment Recommendations       Recommendations for Other Services       Precautions / Restrictions Precautions Precautions: Fall    Mobility  Bed Mobility Overal bed mobility: Needs Assistance Bed Mobility: Rolling;Sidelying to Sit Rolling: Mod assist Sidelying to sit: Mod assist       General bed mobility comments: cues for sequence, use of rail and assist to rotate trunk and pivot pelvis   Transfers Overall transfer level: Needs assistance   Transfers: Sit to/from Stand;Stand Pivot Transfers Sit to Stand: Mod assist Stand pivot transfers: Max assist;+2 physical assistance       General transfer comment: max cues for hand placement with feet blocked and assist to stand from bed with RW and 2 person assist. pt with difficulty processing and following commands with hand over hand assist for pivot to chair. Pt stood x 2 from [facility] with mod assist maintaining posterior lean and flexed posture. 2nd pivot trial without RW with therapist in front of pt blocking knees and guiding pt with belt while tech posterior to  pt assisting with pelvic control and equipment management  Ambulation/Gait                 Stairs            Wheelchair Mobility    Modified Rankin (Stroke Patients Only)       Balance Overall balance assessment: Needs assistance   Sitting balance-Leahy Scale: Fair   Postural control: Posterior lean   Standing balance-Leahy Scale: Poor                      Cognition Arousal/Alertness: Awake/alert Behavior During Therapy: Flat affect Overall Cognitive Status: Impaired/Different from baseline Area of Impairment: Orientation;Safety/judgement;Problem solving Orientation Level: Time;Situation   Memory: Decreased short-term memory   Safety/Judgement: Decreased awareness of deficits;Decreased awareness of safety   Problem Solving: Slow processing;Decreased initiation;Difficulty sequencing;Requires verbal cues;Requires tactile cues      Exercises General Exercises - Lower Extremity Long Arc Quad: Seated;Both;10 reps;AAROM Hip Flexion/Marching: AAROM;Both;10 reps;Seated    General Comments        Pertinent Vitals/Pain Pain Assessment: No/denies pain  HR 113 sats 99% on RA    Home Living                      Prior Function            PT Goals (current goals can now be found in the care plan section) Progress towards PT goals: Progressing toward goals (slowly)    Frequency       PT Plan Current plan remains appropriate  Co-evaluation             End of Session Equipment Utilized During Treatment: Gait belt Activity Tolerance: Patient tolerated treatment well Patient left: in chair;with call bell/phone within reach;with chair alarm set;with nursing/sitter in room;with family/visitor present     Time: 3419-6222 PT Time Calculation (min) (ACUTE ONLY): 25 min  Charges:  $Therapeutic Activity: 23-37 mins                    G Codes:      Andrea [name] 07-24-15, 12:25 PM Andrea [name], PT [phone]

## 2015-06-28 LAB — RENAL FUNCTION PANEL
ANION GAP: 12 (ref 5–15)
Albumin: 3 g/dL — ABNORMAL LOW (ref 3.5–5.0)
BUN: 84 mg/dL — ABNORMAL HIGH (ref 6–20)
CO2: 24 mmol/L (ref 22–32)
CREATININE: 3.62 mg/dL — AB (ref 0.44–1.00)
Calcium: 10.1 mg/dL (ref 8.9–10.3)
Chloride: 91 mmol/L — ABNORMAL LOW (ref 101–111)
GFR, EST AFRICAN AMERICAN: 14 mL/min — AB (ref >60)
GFR, EST NON AFRICAN AMERICAN: 12 mL/min — AB (ref >60)
GLUCOSE: 182 mg/dL — AB (ref 65–99)
Phosphorus: 6 mg/dL — ABNORMAL HIGH (ref 2.5–4.6)
Potassium: 4.2 mmol/L (ref 3.5–5.1)
Sodium: 127 mmol/L — ABNORMAL LOW (ref 135–145)

## 2015-06-28 LAB — GLUCOSE, CAPILLARY
GLUCOSE-CAPILLARY: 143 mg/dL — AB (ref 65–99)
Glucose-Capillary: 111 mg/dL — ABNORMAL HIGH (ref 65–99)
Glucose-Capillary: 126 mg/dL — ABNORMAL HIGH (ref 65–99)
Glucose-Capillary: 182 mg/dL — ABNORMAL HIGH (ref 65–99)
Glucose-Capillary: 83 mg/dL (ref 65–99)

## 2015-06-28 LAB — CARBOXYHEMOGLOBIN
CARBOXYHEMOGLOBIN: 1.3 % (ref 0.5–1.5)
Methemoglobin: 1.4 % (ref 0.0–1.5)
O2 Saturation: 41.8 %
TOTAL HEMOGLOBIN: 9.8 g/dL — AB (ref 12.0–16.0)

## 2015-06-28 LAB — CBC
HCT: 29.5 % — ABNORMAL LOW (ref 36.0–46.0)
Hemoglobin: 10.1 g/dL — ABNORMAL LOW (ref 12.0–15.0)
MCH: 22 pg — AB (ref 26.0–34.0)
MCHC: 34.2 g/dL (ref 30.0–36.0)
MCV: 64.1 fL — ABNORMAL LOW (ref 78.0–100.0)
PLATELETS: 243 10*3/uL (ref 150–400)
RBC: 4.6 MIL/uL (ref 3.87–5.11)
RDW: 19.9 % — ABNORMAL HIGH (ref 11.5–15.5)
WBC: 5.4 10*3/uL (ref 4.0–10.5)

## 2015-06-28 NOTE — Progress Notes (Signed)
Patient ID: Andrea [name], female   DOB: 12-13-1951, 64 y.o.   MRN: [id number] Advanced Heart Failure Rounding Note   Subjective:    Admitted from HF clinic with increased dyspnea and volume overload.  On 6/10 underwent swan and IABP placement for persistent cardiogenic shock with co-ox in 40s and worsening renal failure. Now on CVVHD (started on 6/11) Remains on  dobutamine 5 and levophed 10 .   CVVHD stopped 6/18. CR 1.9-> 2.8-->3.65>3.93 >3.8>3.4 > 3.36 > 3.31 > 3.11 > 3.35>3.6  IABP out 6/24  Remains on norepi 13 mcg and dobutamine 7 mcg. CO-OX down to 41%. Continues to received IV lasix.   Poor urine output noted.   Complaining of fatigue. Denies SOB.     Echo LVEF 10% RV moderately HK. Severe MR/TR  Objective:   Weight Range:  Vital Signs:   Temp:  [97.4 F (36.3 C)-97.6 F (36.4 C)] 97.5 F (36.4 C) (06/28 0746) Pulse Rate:  [90] 90 (06/27 1615) Resp:  [15-32] 21 (06/28 0746) BP: (86-105)/(55-71) 103/71 mmHg (06/28 0746) SpO2:  [92 %-100 %] 100 % (06/28 0746) Weight:  [149 lb 14.6 oz (68 kg)] 149 lb 14.6 oz (68 kg) (06/28 0400) Last BM Date: 06/27/15  Weight change: Filed Weights   06/26/15 0454 06/27/15 0300 06/28/15 0400  Weight: 149 lb 0.5 oz (67.6 kg) 149 lb 7.6 oz (67.8 kg) 149 lb 14.6 oz (68 kg)    Intake/Output:   Intake/Output Summary (Last 24 hours) at 06/28/15 0750 Last data filed at 06/28/15 0600  Gross per 24 hour  Intake 1207.9 ml  Output    660 ml  Net  547.9 ml     PHYSICAL EXAM: CVP 11-12  General: Lying in bed. Fatigued appearing.  HEENT: normal Neck: supple. JVP to jaw.  Carotids 2+ bilaterally; no bruits. No lymphadenopathy or thryomegaly appreciated. Cor: PMI normal. Regular No rubs, 3/6 MR/TR, +S3 Lungs: clear Abdomen: soft, tender, nondistended. No hepatosplenomegaly. No bruits or masses. Good bowel sounds. Extremities: no cyanosis, clubbing, rash, noedema, back. R and L pedal pulse 2+   Neurro: alert & orientedx1, cranial  nerves grossly intact. Moves all 4 extremities w/o difficulty. Affect pleasant+tremor          Telemetry: Sinus Tach 100s-150s  Labs: Basic Metabolic Panel:  Recent Labs Lab 06/24/15 0554 06/25/15 0407 06/26/15 0502 06/27/15 0440 06/28/15 0400  NA 133* 130* 131* 127* 127*  K 4.9 5.0 4.4 4.2 4.2  CL 97* 95* 95* 91* 91*  CO2 26 25 24 24 24   GLUCOSE 162* 220* 148* 311* 182*  BUN 66* 75* 78* 77* 84*  CREATININE 3.36* 3.31* 3.11* 3.35* 3.62*  CALCIUM 10.7* 10.3 9.9 10.0 10.1  PHOS 6.0* 5.1* 5.0* 5.5* 6.0*    Liver Function Tests:  Recent Labs Lab 06/24/15 0554 06/25/15 0407 06/26/15 0502 06/27/15 0440 06/28/15 0400  ALBUMIN 3.0* 3.0* 2.9* 2.9* 3.0*   No results for input(s): LIPASE, AMYLASE in the last 168 hours. No results for input(s): AMMONIA in the last 168 hours.  CBC:  Recent Labs Lab 06/24/15 0449 06/25/15 0407 06/26/15 0502 06/27/15 0440 06/28/15 0400  WBC 5.1 5.3 4.3 4.5 5.4  HGB 9.3* 9.3* 9.3* 9.4* 10.1*  HCT 27.7* 27.6* 27.8* 28.2* 29.5*  MCV 64.4* 64.3* 65.1* 65.3* 64.1*  PLT 187 155 176 201 243    Cardiac Enzymes: No results for input(s): CKTOTAL, CKMB, CKMBINDEX, TROPONINI in the last 168 hours.  BNP: BNP (last 3 results)  Recent Labs  03/15/15  2150 03/22/15 1601 06/19/2015 1800  BNP 701.4* 930.9* 1217.7*    ProBNP (last 3 results)  Recent Labs  11/08/14 1800  PROBNP 2681.0*      Other results:  Imaging: No results found.   Medications:     Scheduled Medications: . antiseptic oral rinse  7 mL Mouth Rinse BID  . calcitRIOL  0.25 mcg Oral Daily  . cefTRIAXone (ROCEPHIN)  IV  1 g Intravenous Q24H  . DULoxetine  60 mg Oral Daily  . enoxaparin (LOVENOX) injection  30 mg Subcutaneous Q24H  . feeding supplement (ENSURE ENLIVE)  237 mL Oral BID  . ferrous sulfate  325 mg Oral TID WC  . gabapentin  200 mg Oral QHS  . insulin aspart  0-9 Units Subcutaneous 6 times per day  . pantoprazole  40 mg Oral Daily  .  rOPINIRole  2 mg Oral QHS  . sodium chloride  10-40 mL Intracatheter Q12H  . sodium chloride  3 mL Intravenous Q12H  . zolpidem  5 mg Oral QHS    Infusions: . sodium chloride 250 mL (06/16/15 0600)  . DOBUTamine 7 mcg/kg/min (06/28/15 0151)  . norepinephrine (LEVOPHED) Adult infusion 13 mcg/min (06/27/15 1900)    PRN Medications: sodium chloride, acetaminophen, fentaNYL (SUBLIMAZE) injection, guaiFENesin-dextromethorphan, nitroGLYCERIN, ondansetron (ZOFRAN) IV, ondansetron, oxybutynin, polyethylene glycol, sodium chloride, sodium chloride, sodium chloride, traMADol-acetaminophen   Assessment/Plan    1. 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 CAD S/P PCI with DES to prox RCA and stent to distal RCA in 2012. No s/s of ischemia.  3. PSVT - quiescent. No longer on amio 4. Acute on chronic renal failure, stage IV 5. Hyperkalemia 6. Acute blood loss anemia  7. UTI 8. Hyponatremia  Creatinine rising again. Sodium falling to 127. She continues to decline dual pressors CO-OX down to 42%. Continue  Lasix 120 IV bid yesterday. Will continue current Lasix today.     HR up 150s this morning but back to down to 110 after a few seconds. ? A flutter.  No BB with cardiogenic shock.   Advanced therapies not an option. Family meeting today . Anticipate transitioning to comfort care.   Treating UTI with rocephin x 6/7 days (no urine cx sent).   CLEGG,AMY, NP-C  7:50 AM Advanced Heart Failure Team Pager [phone] (M-F; 7a - 4p)   Patient seen and examined with Tonye Becket, NP. We discussed all aspects of the encounter. I agree with the assessment and plan as stated above.   Despite dual inotropes, renal function and cardiac output worsening again. Has become increasingly uremic. Will need to switch to comfort care today. Family conference pending for this am.   The patient is critically ill with multiple organ systems failure and requires high complexity  decision making for assessment and support, frequent evaluation and titration of therapies, application of advanced monitoring technologies and extensive interpretation of multiple databases.   Critical Care Time devoted to patient care services described in this note is 45 Minutes.  Bensimhon, Daniel,MD 9:24 AM

## 2015-06-28 NOTE — Clinical Social Work Note (Signed)
CSW Consult Acknowledged:   CSW received a consult for SNF placement. CSW reviewed chart. Per MD's note on 6/28 pt will need to switch to comfort care and a family conference will take place today. CSW will await for the outcome of the family meeting before discussing discharge disposition.       Laney Louderback, MSW, LCSWA [phone]

## 2015-06-28 NOTE — Progress Notes (Signed)
Nutrition Brief Note  Chart reviewed. Per MD family meeting today to discuss MD recommendation of moving to comfort care.  Nutrition interventions in place.  Will follow up once plan established. Please re-consult as needed.   Kendell Bane RD, LDN, CNSC [phone] Pager [phone] After Hours Pager

## 2015-06-29 LAB — RENAL FUNCTION PANEL
ALBUMIN: 3 g/dL — AB (ref 3.5–5.0)
ANION GAP: 16 — AB (ref 5–15)
BUN: 90 mg/dL — ABNORMAL HIGH (ref 6–20)
CO2: 24 mmol/L (ref 22–32)
CREATININE: 3.71 mg/dL — AB (ref 0.44–1.00)
Calcium: 9.9 mg/dL (ref 8.9–10.3)
Chloride: 90 mmol/L — ABNORMAL LOW (ref 101–111)
GFR, EST AFRICAN AMERICAN: 14 mL/min — AB (ref >60)
GFR, EST NON AFRICAN AMERICAN: 12 mL/min — AB (ref >60)
Glucose, Bld: 104 mg/dL — ABNORMAL HIGH (ref 65–99)
Phosphorus: 5.6 mg/dL — ABNORMAL HIGH (ref 2.5–4.6)
Potassium: 3.9 mmol/L (ref 3.5–5.1)
SODIUM: 130 mmol/L — AB (ref 135–145)

## 2015-06-29 LAB — GLUCOSE, CAPILLARY
GLUCOSE-CAPILLARY: 102 mg/dL — AB (ref 65–99)
GLUCOSE-CAPILLARY: 147 mg/dL — AB (ref 65–99)
Glucose-Capillary: 184 mg/dL — ABNORMAL HIGH (ref 65–99)
Glucose-Capillary: 104 mg/dL — ABNORMAL HIGH (ref 65–99)
Glucose-Capillary: 140 mg/dL — ABNORMAL HIGH (ref 65–99)
Glucose-Capillary: >600 mg/dL (ref 65–99)
Glucose-Capillary: 191 mg/dL — ABNORMAL HIGH (ref 65–99)
Glucose-Capillary: 86 mg/dL (ref 65–99)

## 2015-06-29 LAB — CBC
HCT: 29.3 % — ABNORMAL LOW (ref 36.0–46.0)
Hemoglobin: 9.7 g/dL — ABNORMAL LOW (ref 12.0–15.0)
MCH: 21.6 pg — AB (ref 26.0–34.0)
MCHC: 33.1 g/dL (ref 30.0–36.0)
MCV: 65.3 fL — AB (ref 78.0–100.0)
PLATELETS: 271 10*3/uL (ref 150–400)
RBC: 4.49 MIL/uL (ref 3.87–5.11)
RDW: 20.2 % — AB (ref 11.5–15.5)
WBC: 5.9 10*3/uL (ref 4.0–10.5)

## 2015-06-29 LAB — CARBOXYHEMOGLOBIN
CARBOXYHEMOGLOBIN: 1.5 % (ref 0.5–1.5)
Methemoglobin: 1.5 % (ref 0.0–1.5)
O2 Saturation: 58.5 %
Total hemoglobin: 10.2 g/dL — ABNORMAL LOW (ref 12.0–16.0)

## 2015-06-29 MED ORDER — MORPHINE SULFATE 2 MG/ML IJ SOLN
2.0000 mg | Freq: Once | INTRAMUSCULAR | Status: DC
  Filled 2015-06-29: qty 1

## 2015-06-29 NOTE — Progress Notes (Signed)
Nutrition Follow-up  DOCUMENTATION CODES:  Non-severe (moderate) malnutrition in context of acute illness/injury  INTERVENTION:  Ensure Enlive (each supplement provides 350kcal and 20 grams of protein)  NUTRITION DIAGNOSIS:  Malnutrition related to acute illness as evidenced by energy intake < 75% for > 7 days, mild depletion of muscle mass.  ongoing  GOAL:  Patient will meet greater than or equal to 90% of their needs  Not met  MONITOR:  PO intake, Supplement acceptance, Labs, Weight trends, I & O's, Skin  REASON FOR ASSESSMENT:  Malnutrition Screening Tool    ASSESSMENT:  Admitted from HF clinic with increased dyspnea and volume overload. On 6/10 underwent swan and IABP placement for persistent cardiogenic shock with co-ox in 47s and worsening renal failure.  Labs and medications reviewed.  Per RN PO intake poor, only drinking ensure.  MD continues to recommend comfort as they have exhausted therapies for pt.  Await pt and family decision   Height:  Ht Readings from Last 1 Encounters:  06/18/2015 5' (1.524 m)    Weight:  Wt Readings from Last 1 Encounters:  06/29/15 148 lb 13 oz (67.5 kg)    Ideal Body Weight:  48 kg  Wt Readings from Last 10 Encounters:  06/29/15 148 lb 13 oz (67.5 kg)  06/08/2015 174 lb 8 oz (79.153 kg)  05/31/15 179 lb (81.194 kg)  04/13/15 172 lb (78.019 kg)  03/26/15 181 lb 4.8 oz (82.237 kg)  03/15/15 178 lb 8 oz (80.967 kg)  02/03/15 180 lb (81.647 kg)  01/17/15 177 lb 3.2 oz (80.377 kg)  12/13/14 184 lb 7 oz (83.66 kg)  11/15/14 188 lb 12.8 oz (85.639 kg)    BMI:  Body mass index is 29.06 kg/(m^2).  Estimated Nutritional Needs:  Kcal:  1600 - 1800  Protein:  120 - 140 g  Fluid:  per MD  Skin:  Reviewed, no issues (skin tear)  Diet Order:  Diet renal with fluid restriction Fluid restriction:: 1500 mL Fluid; Room service appropriate?: Yes; Fluid consistency:: Thin  EDUCATION NEEDS:  Education needs  addressed   Intake/Output Summary (Last 24 hours) at 06/29/15 1433 Last data filed at 06/29/15 0800  Gross per 24 hour  Intake  767.4 ml  Output    490 ml  Net  277.4 ml    Last BM:  6/27  Andrea [name], Andrea [name], Andrea [name] Pager [phone] After Hours Pager

## 2015-06-29 NOTE — Progress Notes (Signed)
Physical Therapy Treatment Patient Details Name: Andrea [name] MRN: [id number] DOB: March 30, 1951 Today's Date: 06/29/2015    History of Present Illness Admitted from HF clinic with increased dyspnea and volume overload. Since admission cardiogenic shock, CVVHD, AMS    PT Comments    Patient progressing with OOB againt this date and more alert and participating in her care per RN.  Patient seems happy to be OOB, RN requested chair liftt pad in chair prior to OOB due to difficulty getting her back to bed last time up to chair.  Follow Up Recommendations  SNF;Supervision/Assistance - 24 hour     Equipment Recommendations  3in1 (PT)    Recommendations for Other Services       Precautions / Restrictions Precautions Precautions: Fall    Mobility  Bed Mobility     Rolling: Mod assist Sidelying to sit: Mod assist       General bed mobility comments: assist to roll to get off bedpan; then rolling to side to come up to sitting; assist for feet off bed, pt puilled up on pt to lift trunk  Transfers Overall transfer level: Needs assistance Equipment used: Rolling walker (2 wheeled);2 person hand held assist Transfers: Sit to/from Stand;Stand Pivot Transfers Sit to Stand: +2 physical assistance;Max assist Stand pivot transfers: Max assist;+2 physical assistance       General transfer comment: Stood to walker with +2 assist for lifting, pt posterior, but able to stand and facilitate ant and lateral weight shift due to difficulty moving feet.  Patient unable to step so sat back on bed, then pivoted with +2 "hand hold" assist  Ambulation/Gait                 Stairs            Wheelchair Mobility    Modified Rankin (Stroke Patients Only)       Balance     Sitting balance-Leahy Scale: Fair Sitting balance - Comments: able to sit trunk upright from supine in bed   Standing balance support: Bilateral upper extremity supported Standing balance-Leahy Scale:  Poor Standing balance comment: leans posterior in standing, difficulty getting COG over BOS                    Cognition Arousal/Alertness: Awake/alert Behavior During Therapy: WFL for tasks assessed/performed Overall Cognitive Status: No family/caregiver present to determine baseline cognitive functioning       Memory: Decreased recall of precautions   Safety/Judgement: Decreased awareness of deficits;Decreased awareness of safety   Problem Solving: Slow processing;Decreased initiation;Requires verbal cues      Exercises      General Comments        Pertinent Vitals/Pain Pain Assessment: Faces Faces Pain Scale: Hurts little more Pain Location: bottom Pain Descriptors / Indicators: Sore Pain Intervention(s): Monitored during session;Repositioned    Home Living                      Prior Function            PT Goals (current goals can now be found in the care plan section) Progress towards PT goals: Progressing toward goals    Frequency  Min 3X/week    PT Plan Current plan remains appropriate    Co-evaluation             End of Session   Activity Tolerance: Patient limited by fatigue Patient left: in chair;with call bell/phone within reach;with chair alarm set  Time: 1610-9604 PT Time Calculation (min) (ACUTE ONLY): 29 min  Charges:  $Therapeutic Activity: 23-37 mins                    G Codes:      Shaniah Baltes,CYNDI 2015-07-15, 3:19 PM  Sheran Lawless, PT [phone] 07-15-2015

## 2015-06-29 NOTE — Progress Notes (Signed)
Patient ID: Andrea [name], female   DOB: 18-Oct-1951, 64 y.o.   MRN: [id number] Advanced Heart Failure Rounding Note   Subjective:    Admitted from HF clinic with increased dyspnea and volume overload.  On 6/10 underwent swan and IABP placement for persistent cardiogenic shock with co-ox in 40s and worsening renal failure. Now on CVVHD (started on 6/11) Remains on  dobutamine 5 and levophed 10 .   CVVHD stopped 6/18. CR 1.9-> 2.8-->3.65>3.93 >3.8>3.4 > 3.36 > 3.31 > 3.11 > 3.35>3.6 >3.7 IABP out 6/24  Remains on norepi 13 mcg and dobutamine 7 mcg. CO-OX 58%. Continues to received IV lasix.   Poor urine output noted.   SOB at rest. Confused. .    Echo LVEF 10% RV moderately HK. Severe MR/TR  Objective:   Weight Range:  Vital Signs:   Temp:  [96.8 F (36 C)-97.7 F (36.5 C)] 97.3 F (36.3 C) (06/29 0400) Resp:  [17-37] 19 (06/29 0600) BP: (91-107)/(59-71) 96/66 mmHg (06/29 0600) SpO2:  [91 %-100 %] 95 % (06/29 0600) Weight:  [148 lb 13 oz (67.5 kg)] 148 lb 13 oz (67.5 kg) (06/29 0600) Last BM Date: 06/27/15  Weight change: Filed Weights   06/27/15 0300 06/28/15 0400 06/29/15 0600  Weight: 149 lb 7.6 oz (67.8 kg) 149 lb 14.6 oz (68 kg) 148 lb 13 oz (67.5 kg)    Intake/Output:   Intake/Output Summary (Last 24 hours) at 06/29/15 0730 Last data filed at 06/29/15 0600  Gross per 24 hour  Intake 1413.9 ml  Output    640 ml  Net  773.9 ml     PHYSICAL EXAM: General: Lying in bed. Fatigued appearing. Dyspneic talking.  HEENT: normal Neck: supple. JVP to jaw.  Carotids 2+ bilaterally; no bruits. No lymphadenopathy or thryomegaly appreciated. Cor: PMI normal. Regular No rubs, 3/6 MR/TR, +S3 Lungs: clear Abdomen: soft, tender, nondistended. No hepatosplenomegaly. No bruits or masses. Good bowel sounds. Extremities: no cyanosis, clubbing, rash, noedema, back. R and L pedal pulse 2+   Neurro: alert & orientedx1, cranial nerves grossly intact. Moves all 4 extremities w/o  difficulty.           Telemetry: Sinus Tach 100s-114s  Labs: Basic Metabolic Panel:  Recent Labs Lab 06/25/15 0407 06/26/15 0502 06/27/15 0440 06/28/15 0400 06/29/15 0430  NA 130* 131* 127* 127* 130*  K 5.0 4.4 4.2 4.2 3.9  CL 95* 95* 91* 91* 90*  CO2 25 24 24 24 24   GLUCOSE 220* 148* 311* 182* 104*  BUN 75* 78* 77* 84* 90*  CREATININE 3.31* 3.11* 3.35* 3.62* 3.71*  CALCIUM 10.3 9.9 10.0 10.1 9.9  PHOS 5.1* 5.0* 5.5* 6.0* 5.6*    Liver Function Tests:  Recent Labs Lab 06/25/15 0407 06/26/15 0502 06/27/15 0440 06/28/15 0400 06/29/15 0430  ALBUMIN 3.0* 2.9* 2.9* 3.0* 3.0*   No results for input(s): LIPASE, AMYLASE in the last 168 hours. No results for input(s): AMMONIA in the last 168 hours.  CBC:  Recent Labs Lab 06/25/15 0407 06/26/15 0502 06/27/15 0440 06/28/15 0400 06/29/15 0430  WBC 5.3 4.3 4.5 5.4 5.9  HGB 9.3* 9.3* 9.4* 10.1* 9.7*  HCT 27.6* 27.8* 28.2* 29.5* 29.3*  MCV 64.3* 65.1* 65.3* 64.1* 65.3*  PLT 155 176 201 243 271    Cardiac Enzymes: No results for input(s): CKTOTAL, CKMB, CKMBINDEX, TROPONINI in the last 168 hours.  BNP: BNP (last 3 results)  Recent Labs  03/15/15 2150 03/22/15 1601 12-Jun-2015 1800  BNP 701.4* 930.9* 1217.7*  ProBNP (last 3 results)  Recent Labs  11/08/14 1800  PROBNP 2681.0*      Other results:  Imaging: No results found.   Medications:     Scheduled Medications: . antiseptic oral rinse  7 mL Mouth Rinse BID  . calcitRIOL  0.25 mcg Oral Daily  . cefTRIAXone (ROCEPHIN)  IV  1 g Intravenous Q24H  . DULoxetine  60 mg Oral Daily  . enoxaparin (LOVENOX) injection  30 mg Subcutaneous Q24H  . feeding supplement (ENSURE ENLIVE)  237 mL Oral BID  . ferrous sulfate  325 mg Oral TID WC  . gabapentin  200 mg Oral QHS  . insulin aspart  0-9 Units Subcutaneous 6 times per day  . pantoprazole  40 mg Oral Daily  . rOPINIRole  2 mg Oral QHS  . sodium chloride  10-40 mL Intracatheter Q12H  .  sodium chloride  3 mL Intravenous Q12H  . zolpidem  5 mg Oral QHS    Infusions: . sodium chloride 250 mL (06/29/15 0604)  . DOBUTamine 7 mcg/kg/min (06/28/15 0151)  . norepinephrine (LEVOPHED) Adult infusion 13 mcg/min (06/29/15 0602)    PRN Medications: sodium chloride, acetaminophen, fentaNYL (SUBLIMAZE) injection, guaiFENesin-dextromethorphan, nitroGLYCERIN, ondansetron (ZOFRAN) IV, ondansetron, polyethylene glycol, sodium chloride, sodium chloride, sodium chloride, traMADol-acetaminophen   Assessment/Plan    1. 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 CAD S/P PCI with DES to prox RCA and stent to distal RCA in 2012. No s/s of ischemia.  3. PSVT - quiescent. No longer on amio 4. Acute on chronic renal failure, stage IV 5. Hyperkalemia 6. Acute blood loss anemia  7. UTI 8. Hyponatremia  Continues to decline. Dyspneic at rest. Give 2 mg IV morphine x 1. Creatinine rising again.  She continues to decline dual pressors.  Continue  Lasix 120 IV bid yesterday. Will continue current Lasix today.     No BB with cardiogenic shock.   Advanced therapies not an option.  Anticipate transitioning to full comfort care.   Treating UTI with rocephin x 7/7 days (no urine cx sent).    CLEGG,AMY, NP-C  7:30 AM Advanced Heart Failure Team Pager [phone] (M-F; 7a - 4p)   Patient seen and examined with Tonye Becket, NP. We discussed all aspects of the encounter. I agree with the assessment and plan as stated above. She continues to deteriorate with worsening renal failure and uremia despite improvement in co-ox. I discussed comfort care with family yesterday and they were not ready to move forward yet. She is now beginning to be more symptomatic. We have exhausted all options for her. We revisit need for morphine gtt today.   The patient is critically ill with multiple organ systems failure and requires high complexity decision making for assessment and support,  frequent evaluation and titration of therapies, application of advanced monitoring technologies and extensive interpretation of multiple databases.   Critical Care Time devoted to patient care services described in this note is 35 Minutes.  Orley Lawry,MD 11:04 AM

## 2015-06-30 LAB — RENAL FUNCTION PANEL
Albumin: 2.9 g/dL — ABNORMAL LOW (ref 3.5–5.0)
Anion gap: 13 (ref 5–15)
BUN: 96 mg/dL — ABNORMAL HIGH (ref 6–20)
CHLORIDE: 92 mmol/L — AB (ref 101–111)
CO2: 25 mmol/L (ref 22–32)
Calcium: 10 mg/dL (ref 8.9–10.3)
Creatinine, Ser: 3.68 mg/dL — ABNORMAL HIGH (ref 0.44–1.00)
GFR, EST AFRICAN AMERICAN: 14 mL/min — AB (ref >60)
GFR, EST NON AFRICAN AMERICAN: 12 mL/min — AB (ref >60)
GLUCOSE: 114 mg/dL — AB (ref 65–99)
PHOSPHORUS: 4.8 mg/dL — AB (ref 2.5–4.6)
POTASSIUM: 4.1 mmol/L (ref 3.5–5.1)
SODIUM: 130 mmol/L — AB (ref 135–145)

## 2015-06-30 LAB — CBC
HEMATOCRIT: 28.6 % — AB (ref 36.0–46.0)
Hemoglobin: 9.7 g/dL — ABNORMAL LOW (ref 12.0–15.0)
MCH: 22.2 pg — AB (ref 26.0–34.0)
MCHC: 33.9 g/dL (ref 30.0–36.0)
MCV: 65.4 fL — AB (ref 78.0–100.0)
PLATELETS: 275 10*3/uL (ref 150–400)
RBC: 4.37 MIL/uL (ref 3.87–5.11)
RDW: 20.5 % — ABNORMAL HIGH (ref 11.5–15.5)
WBC: 7.6 10*3/uL (ref 4.0–10.5)

## 2015-06-30 LAB — GLUCOSE, CAPILLARY
GLUCOSE-CAPILLARY: 160 mg/dL — AB (ref 65–99)
GLUCOSE-CAPILLARY: 120 mg/dL — AB (ref 65–99)
Glucose-Capillary: 105 mg/dL — ABNORMAL HIGH (ref 65–99)
Glucose-Capillary: 113 mg/dL — ABNORMAL HIGH (ref 65–99)
Glucose-Capillary: 147 mg/dL — ABNORMAL HIGH (ref 65–99)
Glucose-Capillary: 165 mg/dL — ABNORMAL HIGH (ref 65–99)
Glucose-Capillary: 160 mg/dL — ABNORMAL HIGH (ref 65–99)

## 2015-06-30 LAB — CARBOXYHEMOGLOBIN
Carboxyhemoglobin: 1.2 % (ref 0.5–1.5)
Methemoglobin: 0.8 % (ref 0.0–1.5)
O2 Saturation: 61.1 %
Total hemoglobin: 9.9 g/dL — ABNORMAL LOW (ref 12.0–16.0)

## 2015-06-30 NOTE — Progress Notes (Signed)
Patient ID: Andrea [name], female   DOB: Jan 01, 1951, 64 y.o.   MRN: [id number] Advanced Heart Failure Rounding Note   Subjective:    Admitted from HF clinic with increased dyspnea and volume overload.  On 6/10 underwent swan and IABP placement for persistent cardiogenic shock with co-ox in 40s and worsening renal failure. Now on CVVHD (started on 6/11) Remains on  dobutamine 5 and levophed 10 .   CVVHD stopped 6/18. Creatinine 3.68 IABP out 6/24  Remains on norepi 13 mcg and dobutamine 7 mcg. Continues to received IV lasix.   Poor urine output noted.   Complains of fatigue. More dyspneic.     Echo LVEF 10% RV moderately HK. Severe MR/TR  Objective:   Weight Range:  Vital Signs:   Temp:  [97.7 F (36.5 C)-97.9 F (36.6 C)] 97.7 F (36.5 C) (06/30 1133) Pulse Rate:  [110] 110 (06/29 1952) Resp:  [14-46] 14 (06/30 1000) BP: (84-102)/(49-69) 92/65 mmHg (06/30 1000) SpO2:  [89 %-100 %] 93 % (06/30 1000) Weight:  [148 lb 9.4 oz (67.4 kg)] 148 lb 9.4 oz (67.4 kg) (06/30 0400) Last BM Date: 06/27/15  Weight change: Filed Weights   06/28/15 0400 06/29/15 0600 06/30/15 0400  Weight: 149 lb 14.6 oz (68 kg) 148 lb 13 oz (67.5 kg) 148 lb 9.4 oz (67.4 kg)    Intake/Output:   Intake/Output Summary (Last 24 hours) at 06/30/15 1202 Last data filed at 06/30/15 1100  Gross per 24 hour  Intake  973.9 ml  Output    795 ml  Net  178.9 ml     PHYSICAL EXAM: General: Lying in bed. Fatigued appearing. HEENT: normal Neck: supple. JVP to jaw.  Carotids 2+ bilaterally; no bruits. No lymphadenopathy or thryomegaly appreciated. Cor: PMI normal. Regular No rubs, 3/6 MR/TR, +S3 Lungs: clear Abdomen: soft, tender, nondistended. No hepatosplenomegaly. No bruits or masses. Good bowel sounds. Extremities: no cyanosis, clubbing, rash, noedema, back. R and L pedal pulse 2+   Neurro: alert & orientedx1, cranial nerves grossly intact. Moves all 4 extremities w/o difficulty.            Telemetry: Sinus Tach 100s-114s  Labs: Basic Metabolic Panel:  Recent Labs Lab 06/26/15 0502 06/27/15 0440 06/28/15 0400 06/29/15 0430 06/30/15 0440  NA 131* 127* 127* 130* 130*  K 4.4 4.2 4.2 3.9 4.1  CL 95* 91* 91* 90* 92*  CO2 GLUCOSE 148* 311* 182* 104* 114*  BUN 78* 77* 84* 90* 96*  CREATININE 3.11* 3.35* 3.62* 3.71* 3.68*  CALCIUM 9.9 10.0 10.1 9.9 10.0  PHOS 5.0* 5.5* 6.0* 5.6* 4.8*    Liver Function Tests:  Recent Labs Lab 06/26/15 0502 06/27/15 0440 06/28/15 0400 06/29/15 0430 06/30/15 0440  ALBUMIN 2.9* 2.9* 3.0* 3.0* 2.9*   No results for input(s): LIPASE, AMYLASE in the last 168 hours. No results for input(s): AMMONIA in the last 168 hours.  CBC:  Recent Labs Lab 06/26/15 0502 06/27/15 0440 06/28/15 0400 06/29/15 0430 06/30/15 0440  WBC 4.3 4.5 5.4 5.9 7.6  HGB 9.3* 9.4* 10.1* 9.7* 9.7*  HCT 27.8* 28.2* 29.5* 29.3* 28.6*  MCV 65.1* 65.3* 64.1* 65.3* 65.4*  PLT 176 201 243 271 275    Cardiac Enzymes: No results for input(s): CKTOTAL, CKMB, CKMBINDEX, TROPONINI in the last 168 hours.  BNP: BNP (last 3 results)  Recent Labs  03/15/15 2150 03/22/15 1601 2015/06/21 1800  BNP 701.4* 930.9* 1217.7*    ProBNP (last 3 results)  Recent  Labs  11/08/14 1800  PROBNP 2681.0*      Other results:  Imaging: No results found.   Medications:     Scheduled Medications: . antiseptic oral rinse  7 mL Mouth Rinse BID  . calcitRIOL  0.25 mcg Oral Daily  . DULoxetine  60 mg Oral Daily  . enoxaparin (LOVENOX) injection  30 mg Subcutaneous Q24H  . feeding supplement (ENSURE ENLIVE)  237 mL Oral BID  . ferrous sulfate  325 mg Oral TID WC  . gabapentin  200 mg Oral QHS  . insulin aspart  0-9 Units Subcutaneous 6 times per day  .  morphine injection  2 mg Intravenous Once  . pantoprazole  40 mg Oral Daily  . rOPINIRole  2 mg Oral QHS  . sodium chloride  10-40 mL Intracatheter Q12H  . sodium chloride  3 mL Intravenous  Q12H  . zolpidem  5 mg Oral QHS    Infusions: . sodium chloride 250 mL (06/29/15 0604)  . DOBUTamine 7 mcg/kg/min (06/30/15 0700)  . norepinephrine (LEVOPHED) Adult infusion 13.013 mcg/min (06/30/15 0700)    PRN Medications: sodium chloride, acetaminophen, fentaNYL (SUBLIMAZE) injection, guaiFENesin-dextromethorphan, nitroGLYCERIN, ondansetron (ZOFRAN) IV, ondansetron, polyethylene glycol, sodium chloride, sodium chloride, sodium chloride, traMADol-acetaminophen   Assessment/Plan    1. 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 CAD S/P PCI with DES to prox RCA and stent to distal RCA in 2012. No s/s of ischemia.  3. PSVT - quiescent. No longer on amio 4. Acute on chronic renal failure, stage IV 5. Hyperkalemia 6. Acute blood loss anemia  7. UTI 8. Hyponatremia   She continues to decline despite dual pressors.  More dyspneic. Continue  Lasix 120 IV bid yesterday. Volume status remains elevated.    No BB with cardiogenic shock.   Advanced therapies not an option.  Anticipate transitioning to full comfort care.     CLEGG,AMY, NP-C  12:02 PM Advanced Heart Failure Team Pager [phone] (M-F; 7a - 4p)   Patient seen and examined with Tonye Becket, NP. We discussed all aspects of the encounter. I agree with the assessment and plan as stated above.   Continues to decline. More uremic. Hypotensive despite dual pressors. CVP 13. There are no viable options left for her unfortunately. Will continue to suggest switch to full comfort care. Stop checking co-ox. Will likely need opioids for comfort soon.  The patient is critically ill with multiple organ systems failure and requires high complexity decision making for assessment and support, frequent evaluation and titration of therapies, application of advanced monitoring technologies and extensive interpretation of multiple databases.   Critical Care Time devoted to patient care services described in this  note is 35 Minutes.  Bensimhon, Daniel,MD 12:25 PM

## 2015-07-01 LAB — RENAL FUNCTION PANEL
Albumin: 3 g/dL — ABNORMAL LOW (ref 3.5–5.0)
Anion gap: 18 — ABNORMAL HIGH (ref 5–15)
BUN: 102 mg/dL — ABNORMAL HIGH (ref 6–20)
CHLORIDE: 89 mmol/L — AB (ref 101–111)
CO2: 22 mmol/L (ref 22–32)
Calcium: 10 mg/dL (ref 8.9–10.3)
Creatinine, Ser: 3.67 mg/dL — ABNORMAL HIGH (ref 0.44–1.00)
GFR calc Af Amer: 14 mL/min — ABNORMAL LOW (ref >60)
GFR, EST NON AFRICAN AMERICAN: 12 mL/min — AB (ref >60)
GLUCOSE: 149 mg/dL — AB (ref 65–99)
POTASSIUM: 4.6 mmol/L (ref 3.5–5.1)
Phosphorus: 5.1 mg/dL — ABNORMAL HIGH (ref 2.5–4.6)
Sodium: 129 mmol/L — ABNORMAL LOW (ref 135–145)

## 2015-07-01 LAB — GLUCOSE, CAPILLARY
GLUCOSE-CAPILLARY: 160 mg/dL — AB (ref 65–99)
Glucose-Capillary: 164 mg/dL — ABNORMAL HIGH (ref 65–99)
Glucose-Capillary: 101 mg/dL — ABNORMAL HIGH (ref 65–99)
Glucose-Capillary: 121 mg/dL — ABNORMAL HIGH (ref 65–99)

## 2015-07-01 LAB — CBC
HEMATOCRIT: 30.1 % — AB (ref 36.0–46.0)
Hemoglobin: 10.3 g/dL — ABNORMAL LOW (ref 12.0–15.0)
MCH: 22.3 pg — ABNORMAL LOW (ref 26.0–34.0)
MCHC: 34.2 g/dL (ref 30.0–36.0)
MCV: 65.2 fL — ABNORMAL LOW (ref 78.0–100.0)
Platelets: 322 10*3/uL (ref 150–400)
RBC: 4.62 MIL/uL (ref 3.87–5.11)
RDW: 20.6 % — ABNORMAL HIGH (ref 11.5–15.5)
WBC: 9 10*3/uL (ref 4.0–10.5)

## 2015-07-01 NOTE — Progress Notes (Signed)
Clinical Social Work following patient for disposition and needs. Spoke with CM, reviewed chart and still no definitive plan for patient.  LCSW will continue to assist as needed. Aware family leaning towards comfort care, however still not determined.  Will be available if needs arise.  Deretha Emory, MSW Clinical Social Work: Emergency Room [phone]

## 2015-07-01 NOTE — Progress Notes (Signed)
Patient ID: Andrea [name], female   DOB: 03/16/1951, 64 y.o.   MRN: [id number] Advanced Heart Failure Rounding Note   Subjective:    Admitted from HF clinic with increased dyspnea and volume overload.  On 6/10 underwent swan and IABP placement for persistent cardiogenic shock with co-ox in 40s and worsening renal failure. Now on CVVHD (started on 6/11) Remains on  dobutamine 5 and levophed 10 .   CVVHD stopped 6/18. Creatinine 3.68 IABP out 6/24  Norepi up to 19 mcg and dobutamine 7 mcg. Urine output sluggish. Cr stable at 3.6 but BUN now > 100. Intermittently confused and sleepy but not uncomfortable. Has times when she is more clear. Denies dyspnea today.    Echo LVEF 10% RV moderately HK. Severe MR/TR   Objective:   Weight Range:  Vital Signs:   Temp:  [97.5 F (36.4 C)-98.7 F (37.1 C)] 98.1 F (36.7 C) (07/01 0700) Pulse Rate:  [115-118] 115 (07/01 0300) Resp:  [14-40] 17 (07/01 0700) BP: (86-116)/(50-80) 92/59 mmHg (07/01 0700) SpO2:  [93 %-100 %] 100 % (07/01 0700) Weight:  [68.9 kg (151 lb 14.4 oz)] 68.9 kg (151 lb 14.4 oz) (07/01 0300) Last BM Date: 06/30/15  Weight change: Filed Weights   06/29/15 0600 06/30/15 0400 07/01/15 0300  Weight: 67.5 kg (148 lb 13 oz) 67.4 kg (148 lb 9.4 oz) 68.9 kg (151 lb 14.4 oz)    Intake/Output:   Intake/Output Summary (Last 24 hours) at 07/01/15 0805 Last data filed at 07/01/15 0600  Gross per 24 hour  Intake 619.31 ml  Output    430 ml  Net 189.31 ml     PHYSICAL EXAM: General: Lying in bed. Lethargic HEENT: normal Neck: supple. JVP to jaw.  Carotids 2+ bilaterally; no bruits. No lymphadenopathy or thryomegaly appreciated. Cor: PMI normal. Regular No rubs, 3/6 MR/TR, +S3 Lungs: clear Abdomen: soft, tender, nondistended. No hepatosplenomegaly. No bruits or masses. Good bowel sounds. Extremities: no cyanosis, clubbing, rash, noedema, back. R and L pedal pulse 2+   Neurro: lethargic but responds.  cranial nerves  grossly intact. Moves all 4 extremities w/o difficulty.           Telemetry: Sinus Tach 100s-114s  Labs: Basic Metabolic Panel:  Recent Labs Lab 06/27/15 0440 06/28/15 0400 06/29/15 0430 06/30/15 0440 07/01/15 0405  NA 127* 127* 130* 130* 129*  K 4.2 4.2 3.9 4.1 4.6  CL 91* 91* 90* 92* 89*  CO2 24 24 24 25 22   GLUCOSE 311* 182* 104* 114* 149*  BUN 77* 84* 90* 96* 102*  CREATININE 3.35* 3.62* 3.71* 3.68* 3.67*  CALCIUM 10.0 10.1 9.9 10.0 10.0  PHOS 5.5* 6.0* 5.6* 4.8* 5.1*    Liver Function Tests:  Recent Labs Lab 06/27/15 0440 06/28/15 0400 06/29/15 0430 06/30/15 0440 07/01/15 0405  ALBUMIN 2.9* 3.0* 3.0* 2.9* 3.0*   No results for input(s): LIPASE, AMYLASE in the last 168 hours. No results for input(s): AMMONIA in the last 168 hours.  CBC:  Recent Labs Lab 06/27/15 0440 06/28/15 0400 06/29/15 0430 06/30/15 0440 07/01/15 0400  WBC 4.5 5.4 5.9 7.6 9.0  HGB 9.4* 10.1* 9.7* 9.7* 10.3*  HCT 28.2* 29.5* 29.3* 28.6* 30.1*  MCV 65.3* 64.1* 65.3* 65.4* 65.2*  PLT 201 243 271 275 322    Cardiac Enzymes: No results for input(s): CKTOTAL, CKMB, CKMBINDEX, TROPONINI in the last 168 hours.  BNP: BNP (last 3 results)  Recent Labs  03/15/15 2150 03/22/15 1601 06/14/2015 1800  BNP 701.4* 930.9* 1217.7*  ProBNP (last 3 results)  Recent Labs  11/08/14 1800  PROBNP 2681.0*      Other results:  Imaging: No results found.   Medications:     Scheduled Medications: . antiseptic oral rinse  7 mL Mouth Rinse BID  . DULoxetine  60 mg Oral Daily  . enoxaparin (LOVENOX) injection  30 mg Subcutaneous Q24H  . feeding supplement (ENSURE ENLIVE)  237 mL Oral BID  . ferrous sulfate  325 mg Oral TID WC  . gabapentin  200 mg Oral QHS  . insulin aspart  0-9 Units Subcutaneous 6 times per day  .  morphine injection  2 mg Intravenous Once  . pantoprazole  40 mg Oral Daily  . rOPINIRole  2 mg Oral QHS  . sodium chloride  10-40 mL Intracatheter Q12H  .  sodium chloride  3 mL Intravenous Q12H  . zolpidem  5 mg Oral QHS    Infusions: . sodium chloride 250 mL (06/29/15 0604)  . DOBUTamine 7 mcg/kg/min (07/01/15 0700)  . norepinephrine (LEVOPHED) Adult infusion 19 mcg/min (07/01/15 0600)    PRN Medications: sodium chloride, acetaminophen, fentaNYL (SUBLIMAZE) injection, nitroGLYCERIN, ondansetron (ZOFRAN) IV, ondansetron, polyethylene glycol, sodium chloride, sodium chloride, sodium chloride, traMADol-acetaminophen   Assessment/Plan    1. 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 CAD S/P PCI with DES to prox RCA and stent to distal RCA in 2012. No s/s of ischemia.  3. PSVT - quiescent. No longer on amio 4. Acute on chronic renal failure, stage IV 5. Hyperkalemia 6. Acute blood loss anemia  7. UTI 8. Hyponatremia   Continues to decline despite dual pressor support. BP more tenuous and more uremic but not uncomfortable . Hypotensive despite dual pressors. CVP 12-13. There are no viable options left for her unfortunately. I spoke with her partner, Andrea [name], at length yesterday and they realize how sick she is. They would like to continue current support for now but they don't want to see her suffer. We agreed to cap levophed dose at 30. If she gets more hypotensive beyond that or is increasingly uremic or dyspneic they will move to comfort care with morphine gtt but they are not ready for that yet.  who Will continue to suggest switch to full comfort care. Stop checking co-ox. Continue lasix.   The patient is critically ill with multiple organ systems failure and requires high complexity decision making for assessment and support, frequent evaluation and titration of therapies, application of advanced monitoring technologies and extensive interpretation of multiple databases.   Critical Care Time devoted to patient care services described in this note is 35 Minutes.  Levy Wellman,MD 8:05  AM

## 2015-07-01 DEATH — deceased

## 2015-07-02 LAB — RENAL FUNCTION PANEL
ANION GAP: 17 — AB (ref 5–15)
Albumin: 2.8 g/dL — ABNORMAL LOW (ref 3.5–5.0)
BUN: 101 mg/dL — ABNORMAL HIGH (ref 6–20)
CO2: 22 mmol/L (ref 22–32)
Calcium: 9.9 mg/dL (ref 8.9–10.3)
Chloride: 88 mmol/L — ABNORMAL LOW (ref 101–111)
Creatinine, Ser: 3.91 mg/dL — ABNORMAL HIGH (ref 0.44–1.00)
GFR calc Af Amer: 13 mL/min — ABNORMAL LOW (ref >60)
GFR, EST NON AFRICAN AMERICAN: 11 mL/min — AB (ref >60)
Glucose, Bld: 231 mg/dL — ABNORMAL HIGH (ref 65–99)
PHOSPHORUS: 5.7 mg/dL — AB (ref 2.5–4.6)
Potassium: 4.6 mmol/L (ref 3.5–5.1)
SODIUM: 127 mmol/L — AB (ref 135–145)

## 2015-07-02 LAB — CBC
HEMATOCRIT: 30.7 % — AB (ref 36.0–46.0)
Hemoglobin: 10.3 g/dL — ABNORMAL LOW (ref 12.0–15.0)
MCH: 22 pg — ABNORMAL LOW (ref 26.0–34.0)
MCHC: 33.6 g/dL (ref 30.0–36.0)
MCV: 65.6 fL — ABNORMAL LOW (ref 78.0–100.0)
Platelets: 373 10*3/uL (ref 150–400)
RBC: 4.68 MIL/uL (ref 3.87–5.11)
RDW: 21.1 % — ABNORMAL HIGH (ref 11.5–15.5)
WBC: 9.6 10*3/uL (ref 4.0–10.5)

## 2015-07-02 LAB — GLUCOSE, CAPILLARY
GLUCOSE-CAPILLARY: 152 mg/dL — AB (ref 65–99)
GLUCOSE-CAPILLARY: 163 mg/dL — AB (ref 65–99)
GLUCOSE-CAPILLARY: 168 mg/dL — AB (ref 65–99)
Glucose-Capillary: 200 mg/dL — ABNORMAL HIGH (ref 65–99)
Glucose-Capillary: 135 mg/dL — ABNORMAL HIGH (ref 65–99)
Glucose-Capillary: 146 mg/dL — ABNORMAL HIGH (ref 65–99)
Glucose-Capillary: 133 mg/dL — ABNORMAL HIGH (ref 65–99)
Glucose-Capillary: 126 mg/dL — ABNORMAL HIGH (ref 65–99)

## 2015-07-02 NOTE — Progress Notes (Addendum)
Patient ID: Andrea [name], female   DOB: November 10, 1951, 64 y.o.   MRN: [id number] Advanced Heart Failure Rounding Note   Subjective:    Admitted from HF clinic with increased dyspnea and volume overload depsite home milrinone.   On 6/10 underwent swan and IABP placement for persistent cardiogenic shock with co-ox in 40s and worsening renal failure. Treated with CVVHD 6/11-6/18. Kidneys recovered partially but creatinine never got better than 3. IABP out 6/24   Remains on dual pressors. Norepi up to 23 mcg and on dobutamine. Urine output sluggish. Cr worse again 3.6->3.9 BUN > 100. Awakens but falls back to sleep immediately.   Echo LVEF 10% RV moderately HK. Severe MR/TR   Objective:   Weight Range:  Vital Signs:   Temp:  [96.1 F (35.6 C)-98.4 F (36.9 C)] 96.1 F (35.6 C) (07/02 0747) Pulse Rate:  [114-115] 114 (07/02 0747) Resp:  [15-36] 15 (07/02 0900) BP: (72-110)/(50-77) 110/65 mmHg (07/02 0900) SpO2:  [90 %-100 %] 97 % (07/02 0900) Weight:  [68.6 kg (151 lb 3.8 oz)] 68.6 kg (151 lb 3.8 oz) (07/02 0500) Last BM Date: 06/30/15  Weight change: Filed Weights   06/30/15 0400 07/01/15 0300 07/02/15 0500  Weight: 67.4 kg (148 lb 9.4 oz) 68.9 kg (151 lb 14.4 oz) 68.6 kg (151 lb 3.8 oz)    Intake/Output:   Intake/Output Summary (Last 24 hours) at 07/02/15 1113 Last data filed at 07/02/15 0918  Gross per 24 hour  Intake  886.1 ml  Output    455 ml  Net  431.1 ml     PHYSICAL EXAM: General: Lying in bed. Lethargic.Awkens but falls right back to sleep  HEENT: normal Neck: supple. JVP to jaw.  Carotids 2+ bilaterally; no bruits. No lymphadenopathy or thryomegaly appreciated. Cor: PMI normal. Regular No rubs, 3/6 MR/TR, +S3 Lungs: clear Abdomen: soft, tender, nondistended. No hepatosplenomegaly. No bruits or masses. Good bowel sounds. Extremities: no cyanosis, clubbing, rash, noedema, back. R and L pedal pulse 2+   Neurro: lethargic but responds.  cranial nerves  grossly intact. Moves all 4 extremities w/o difficulty.           Telemetry: Sinus Tach 100s-114s  Labs: Basic Metabolic Panel:  Recent Labs Lab 06/28/15 0400 06/29/15 0430 06/30/15 0440 07/01/15 0405 07/02/15 0500  NA 127* 130* 130* 129* 127*  K 4.2 3.9 4.1 4.6 4.6  CL 91* 90* 92* 89* 88*  CO2 GLUCOSE 182* 104* 114* 149* 231*  BUN 84* 90* 96* 102* 101*  CREATININE 3.62* 3.71* 3.68* 3.67* 3.91*  CALCIUM 10.1 9.9 10.0 10.0 9.9  PHOS 6.0* 5.6* 4.8* 5.1* 5.7*    Liver Function Tests:  Recent Labs Lab 06/28/15 0400 06/29/15 0430 06/30/15 0440 07/01/15 0405 07/02/15 0500  ALBUMIN 3.0* 3.0* 2.9* 3.0* 2.8*   No results for input(s): LIPASE, AMYLASE in the last 168 hours. No results for input(s): AMMONIA in the last 168 hours.  CBC:  Recent Labs Lab 06/28/15 0400 06/29/15 0430 06/30/15 0440 07/01/15 0400 07/02/15 0500  WBC 5.4 5.9 7.6 9.0 9.6  HGB 10.1* 9.7* 9.7* 10.3* 10.3*  HCT 29.5* 29.3* 28.6* 30.1* 30.7*  MCV 64.1* 65.3* 65.4* 65.2* 65.6*  PLT 243 271 275 322 373    Cardiac Enzymes: No results for input(s): CKTOTAL, CKMB, CKMBINDEX, TROPONINI in the last 168 hours.  BNP: BNP (last 3 results)  Recent Labs  03/15/15 2150 03/22/15 1601 06/15/2015 1800  BNP 701.4* 930.9* 1217.7*    ProBNP (  last 3 results)  Recent Labs  11/08/14 1800  PROBNP 2681.0*      Other results:  Imaging: No results found.   Medications:     Scheduled Medications: . antiseptic oral rinse  7 mL Mouth Rinse BID  . DULoxetine  60 mg Oral Daily  . enoxaparin (LOVENOX) injection  30 mg Subcutaneous Q24H  . feeding supplement (ENSURE ENLIVE)  237 mL Oral BID  . ferrous sulfate  325 mg Oral TID WC  . gabapentin  200 mg Oral QHS  . insulin aspart  0-9 Units Subcutaneous 6 times per day  .  morphine injection  2 mg Intravenous Once  . pantoprazole  40 mg Oral Daily  . rOPINIRole  2 mg Oral QHS  . sodium chloride  10-40 mL Intracatheter Q12H  .  sodium chloride  3 mL Intravenous Q12H  . zolpidem  5 mg Oral QHS    Infusions: . sodium chloride 250 mL (06/29/15 0604)  . DOBUTamine 7 mcg/kg/min (07/01/15 1100)  . norepinephrine (LEVOPHED) Adult infusion 25 mcg/min (07/02/15 0120)    PRN Medications: sodium chloride, acetaminophen, fentaNYL (SUBLIMAZE) injection, nitroGLYCERIN, ondansetron (ZOFRAN) IV, ondansetron, polyethylene glycol, sodium chloride, sodium chloride, sodium chloride, traMADol-acetaminophen   Assessment/Plan    1. 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 CAD S/P PCI with DES to prox RCA and stent to distal RCA in 2012. No s/s of ischemia.  3. PSVT - quiescent. No longer on amio 4. Acute on chronic renal failure, stage IV 5. Hyperkalemia 6. Acute blood loss anemia  7. UTI 8. Hyponatremia   Continues to decline despite dual pressor support. BP more tenuous and more uremic but not uncomfortable Hypotensive despite dual pressors. CVP up There are no viable options left for her unfortunately. I spoke with her partner, [name], at length on Thursday and they realize how sick she is. They would like to continue current support for now but they don't want to see her suffer. We agreed to cap levophed dose at 30 and dobutamine at 5. If she gets more hypotensive beyond that or is increasingly uremic or dyspneic they will move to comfort care with morphine gtt but they are not ready for that yet. We continue to suggest switch to full comfort care. Continue lasix. I suspect she will pass this week. Stop all oral meds and CVP monitoring.    Bensimhon, Daniel,MD 11:13 AM

## 2015-07-03 DIAGNOSIS — I209 Angina pectoris, unspecified: Secondary | ICD-10-CM | POA: Insufficient documentation

## 2015-07-03 DIAGNOSIS — R0602 Shortness of breath: Secondary | ICD-10-CM

## 2015-07-03 DIAGNOSIS — Z515 Encounter for palliative care: Secondary | ICD-10-CM

## 2015-07-03 LAB — RENAL FUNCTION PANEL
Albumin: 2.8 g/dL — ABNORMAL LOW (ref 3.5–5.0)
Anion gap: 15 (ref 5–15)
BUN: 109 mg/dL — AB (ref 6–20)
CALCIUM: 10 mg/dL (ref 8.9–10.3)
CO2: 23 mmol/L (ref 22–32)
CREATININE: 4.64 mg/dL — AB (ref 0.44–1.00)
Chloride: 90 mmol/L — ABNORMAL LOW (ref 101–111)
GFR calc Af Amer: 11 mL/min — ABNORMAL LOW (ref >60)
GFR, EST NON AFRICAN AMERICAN: 9 mL/min — AB (ref >60)
GLUCOSE: 158 mg/dL — AB (ref 65–99)
Phosphorus: 7.5 mg/dL — ABNORMAL HIGH (ref 2.5–4.6)
Potassium: 5.1 mmol/L (ref 3.5–5.1)
SODIUM: 128 mmol/L — AB (ref 135–145)

## 2015-07-03 LAB — GLUCOSE, CAPILLARY: Glucose-Capillary: 151 mg/dL — ABNORMAL HIGH (ref 65–99)

## 2015-07-03 MED ORDER — GLYCOPYRROLATE 0.2 MG/ML IJ SOLN
0.2000 mg | INTRAMUSCULAR | Status: DC | PRN
  Administered 2015-07-03: 0.2 mg via INTRAVENOUS
  Filled 2015-07-03 (×3): qty 1

## 2015-07-03 MED ORDER — HYDROMORPHONE BOLUS VIA INFUSION
0.5000 mg | INTRAVENOUS | Status: DC | PRN
  Administered 2015-07-04 (×6): 0.5 mg via INTRAVENOUS
  Filled 2015-07-03 (×7): qty 1

## 2015-07-03 MED ORDER — MORPHINE SULFATE 2 MG/ML IJ SOLN: 2.0000 mg | INTRAMUSCULAR | Status: DC | PRN

## 2015-07-03 MED ORDER — MORPHINE SULFATE 2 MG/ML IJ SOLN
2.0000 mg | Freq: Once | INTRAMUSCULAR | Status: AC
Start: 2015-07-03 — End: 2015-07-03
  Administered 2015-07-03: 2 mg via INTRAVENOUS

## 2015-07-03 MED ORDER — LORAZEPAM 2 MG/ML IJ SOLN
0.5000 mg | INTRAMUSCULAR | Status: DC | PRN
  Administered 2015-07-04: 1 mg via INTRAVENOUS
  Filled 2015-07-03: qty 1

## 2015-07-03 MED ORDER — SODIUM CHLORIDE 0.9 % IV SOLN
0.5000 mg/h | INTRAVENOUS | Status: DC
  Administered 2015-07-03: 0.25 mg/h via INTRAVENOUS
  Administered 2015-07-03: 0.5 mg/h via INTRAVENOUS
  Filled 2015-07-03: qty 5

## 2015-07-03 MED ORDER — HYDROMORPHONE BOLUS VIA INFUSION
0.2000 mg | INTRAVENOUS | Status: DC | PRN
  Administered 2015-07-03: 0.2 mg via INTRAVENOUS
  Filled 2015-07-03 (×2): qty 1

## 2015-07-03 NOTE — Consult Note (Signed)
Consultation Note Date: 07/03/2015   Patient Name: Andrea [name]  DOB: [birth year]  MRN: [id number]  Age / Sex: 64 y.o., female   PCP: Maury Dus, MD Referring Physician: Jolaine Artist, MD  Reason for Consultation: Non pain symptom management, Pain control, Psychosocial/spiritual support, Terminal care and Withdrawal of life-sustaining treatment  Palliative Care Assessment and Plan Summary of Established Goals of Care and Medical Treatment Preferences   Clinical Assessment/Narrative: Pt is a 64 yo female admitted from [facility] with increased dyspnea 06/14/2015.She has been progressively declining despite IABP placement. She also is now in renal failure. CVVhd 6/11-6/18 with only partial recovery. Her creatinine today is 4.64. She is on 2 pressors and has remained hypotensive. Last night she had a restless night, c/o persistent chest pain. Orders for ms04 79m given with relief. She awakens to voice. Too weak to talk but in a whisper. Dr. MAundra Dubinspoke to her wife and discussed that at this point we had exhausted all measures for improvement and recommended comfort care. I met with spouse, BMliss Sax She shares with me that CBrennwould not want further life support and that she feels like she knew she was dying; told her thank you for everything yesterday and that she loved her. After lengthy conversation, specific plan as follows: 1. Deactivate defibrillator 2. Dilaudid continuous infusion to manage chest pain and dyspnea 3. DC pressors and cardiac monitoring 4. Remain on 2H. Pt and BMliss Saxfeel supported and trust staff here. 5. Recommended [facility] for subsequent bereavement for BMliss Saxas well as their 173yo son. 6. Discussed prognosis of hours to days. BMliss Saxis tearful but understands that her disease is that severe and feels that cardiology as well as herself , pt,  have pursued all reasonable options and would not want further aggressive care and wish is to pursue  comfort measures for peaceful dignified death 7. Will change from partial code to DNR  Contacts/Participants in Discussion: Primary Decision Maker: Spouse, [name] HCPOA:  yes  Code Status/Advance Care Planning:  DNR  Symptom Management:   Chest Pain: Start dilaudid infusion at 0.25/hr with bolus of 0.2 mg q15 min prn. Monitor and titrate for comfort  Dyspnea: Starting dilaudid infusion  Secretions: PRN robinul available. No secretions at this point  Agitation: Pt is calm, very weak. Will add prn ativan 0,5-132mq2 prn  Additional Recommendations (Limitations, Scope, Preferences):  As discussed above Psycho-social/Spiritual:   Support System: Limited. [place] and has called her [place] who is on the way  Desire for further Chaplaincy support:no  Prognosis: Hours - Days  Discharge Planning:  Anticipate hospital death      Chief Complaint/History of Present Illness: Pt is a [age]o female admitted for dyspnea from heart failure clinic. She has an EF of 10%, Despite multiple aggressive measures her condition has continued to decline. She is now in renal failure despite CVVHD and hypotensive on 2 pressors.   Primary Diagnoses  Present on Admission:  . Acute on chronic systolic CHF (congestive heart failure), NYHA class 4 . Renal failure, acute on chronic  Palliative Review of Systems: Pt too weak to talk. She does nod her head "yes" that she is having chest pain and shortness of breath I have reviewed the medical record, interviewed the patient and family, and examined the patient. The following aspects are pertinent.  Past Medical History  Diagnosis Date  . Chronic systolic heart failure   . Hypertension   .  Dyslipidemia   . Premature ventricular contractions (PVCs) (VPCs)   . GERD (gastroesophageal reflux disease)   . Coronary artery disease     a. DES-dRCA 11/2010. b. DES-pRCA 04/2011 (in an area free of  disease on prior cath). c. s/p DES-mRCA 03/2013 for NSTEMI.  . Ischemic cardiomyopathy     a. Hx of medtronic ICD. b. EF 15-20% by cath 03/2013.  . Pulmonary hypertension   . Gout   . Obesity   . Thrombocytopenia   . Depression   . Hypotension     a. Admission 09/2012 for hypotn & ARF. b. Meds held 03/2013 due to hypotension.  . NSTEMI (non-ST elevated myocardial infarction) 03/2013  . Anginal pain   . Asthma   . Microcytic anemia     a. Noted 03/2013 on labs, iron studies WNL.  Marland Kitchen Anxiety   . CHF (congestive heart failure)   . HTN (hypertension)   . Primary pulmonary HTN   . Heart murmur   . Bipolar disorder   . AICD (automatic cardioverter/defibrillator) present     Automatic implantable cardiac defibrillator in situ  . Pneumonia "once"  . Type II diabetes mellitus     "diet controlled" (06/20/2015)  . Migraine headache     "no pain; aura/visual problems only; at least 2-3X/month" (06/06/2015)  . Stroke 02/2010    left brain CVA? recurrent TIA's treated with TPA  . Arthritis     "knees"  (06/26/2015)  . DJD (degenerative joint disease)   . Chronic back pain     "mid and lower back" (06/23/2015)  . Acute renal insufficiency     a. 09/2012. b. Also noted post-cath 03/2013.  . CKD (chronic kidney disease) stage 3, GFR 30-59 ml/min    History   Social History  . Marital Status: Married    Spouse Name: N/A  . Number of Children: 1  . Years of Education: N/A   Occupational History  .     Social History Main Topics  . Smoking status: Former Smoker -- 0.05 packs/day for 5 years    Types: Cigarettes    Quit date: 04/23/1982  . Smokeless tobacco: Never Used  . Alcohol Use: No  . Drug Use: No  . Sexual Activity: Not Currently   Other Topics Concern  . None   Social History Narrative   ** Merged History Encounter **       Family History  Problem Relation Age of Onset  . Heart failure Mother   . Heart attack Father   . Heart disease Brother    Scheduled Meds: .  antiseptic oral rinse  7 mL Mouth Rinse BID  . sodium chloride  10-40 mL Intracatheter Q12H   Continuous Infusions: . HYDROmorphone 0.25 mg/hr (07/03/15 0950)   PRN Meds:.glycopyrrolate, HYDROmorphone, LORazepam, nitroGLYCERIN, ondansetron (ZOFRAN) IV, sodium chloride, sodium chloride Medications Prior to Admission:  Prior to Admission medications   Medication Sig Start Date End Date Taking? Authorizing Provider  calcitRIOL (ROCALTROL) 0.25 MCG capsule Take 1 capsule by mouth daily. 01/07/15  Yes Historical Provider, MD  DULoxetine (CYMBALTA) 60 MG capsule Take 60 mg by mouth daily.   Yes Historical Provider, MD  ferrous sulfate 325 (65 FE) MG tablet Take 1 tablet (325 mg total) by mouth 3 (three) times daily with meals. Patient taking differently: Take 325 mg by mouth 2 (two) times daily.  03/26/15  Yes Janece Canterbury, MD  furosemide (LASIX) 40 MG tablet Take 1 tablet (40 mg total) by mouth daily. 04/13/15  Yes Amy D Clegg, NP  gabapentin (NEURONTIN) 100 MG capsule Take 100 mg by mouth 2 (two) times daily.    Yes Historical Provider, MD  hydrALAZINE (APRESOLINE) 25 MG tablet Take 1 tablet (25 mg total) by mouth 3 (three) times daily. 04/13/15  Yes Amy D Clegg, NP  magnesium oxide (MAG-OX) 400 (241.3 MG) MG tablet Take 1 tablet (400 mg total) by mouth 3 (three) times daily. 08/09/14  Yes Jolaine Artist, MD  milrinone [facility]) 20 MG/100ML SOLN infusion Inject 30.225 mcg/min into the vein continuous. 04/12/14  Yes Rande Brunt, NP  pantoprazole (PROTONIX) 40 MG tablet Take 1 tablet by mouth daily. 01/07/15  Yes Historical Provider, MD  ranitidine (ZANTAC) 300 MG tablet Take 1 tablet by mouth daily. 12/20/14  Yes Historical Provider, MD  rOPINIRole (REQUIP) 2 MG tablet Take 0.5 tablets by mouth at bedtime. 12/20/14  Yes Historical Provider, MD  traMADol-acetaminophen (ULTRACET) 37.5-325 MG per tablet Take 1 tablet by mouth every 6 (six) hours as needed. 05/16/15  Yes Tatyana Kirichenko, PA-C    zolpidem (AMBIEN) 5 MG tablet Take 1 tablet by mouth at bedtime. 12/20/14  Yes Historical Provider, MD  acetaminophen (TYLENOL) 500 MG tablet Take 500 mg by mouth every 6 (six) hours as needed for mild pain.    Historical Provider, MD  diclofenac sodium (VOLTAREN) 1 % GEL Apply 2 g topically 4 (four) times daily as needed (pain).     Historical Provider, MD  nitroGLYCERIN (NITROSTAT) 0.4 MG SL tablet Place 1 tablet (0.4 mg total) under the tongue every 5 (five) minutes as needed for chest pain (MAX 3 TABLETS IN 15 MINUTES). 01/17/15   Evans Lance, MD  polyethylene glycol [facility] / Floria Raveling) packet Take 17 g by mouth daily as needed for mild constipation.    Historical Provider, MD   Allergies  Allergen Reactions  . Aspirin Shortness Of Breath and Other (See Comments)    Asthma symptoms  . Brilinta [Ticagrelor] Shortness Of Breath and Other (See Comments)    Gout   . Percocet [Oxycodone-Acetaminophen] Nausea And Vomiting    Patient can tolerate acetaminophen solely  . Darvon Nausea And Vomiting  . Diovan [Valsartan] Other (See Comments) and Nausea Only    unknown  . Imitrex [Sumatriptan Base] Other (See Comments)    Asthma symptoms, shortness of breath  . Lactose Intolerance (Gi)   . Licorice Flavor [Artificial Licorice Flavor]     Black licorice induces asthma   . Nsaids Nausea And Vomiting and Other (See Comments)    GI upset   . Tape Other (See Comments)    Tears skin off.  Paper tape only please.   CBC:    Component Value Date/Time   WBC 9.6 07/02/2015 0500   HGB 10.3* 07/02/2015 0500   HCT 30.7* 07/02/2015 0500   PLT 373 07/02/2015 0500   MCV 65.6* 07/02/2015 0500   NEUTROABS 2.9 06/25/2015 2003   LYMPHSABS 1.1 06/26/2015 2003   MONOABS 0.5 06/22/2015 2003   EOSABS 0.1 06/30/2015 2003   BASOSABS 0.0 06/21/2015 2003   Comprehensive Metabolic Panel:    Component Value Date/Time   NA 128* 07/03/2015 0429   K 5.1 07/03/2015 0429   CL 90* 07/03/2015 0429   CO2 23  07/03/2015 0429   BUN 109* 07/03/2015 0429   CREATININE 4.64* 07/03/2015 0429   GLUCOSE 158* 07/03/2015 0429   CALCIUM 10.0 07/03/2015 0429   AST 18 06/13/2015 2003   ALT 9* 06/29/2015 2003  ALKPHOS 46 06/12/2015 2003   BILITOT 1.3* 06/05/2015 2003   PROT 7.4 06/22/2015 2003   ALBUMIN 2.8* 07/03/2015 0429    Physical Exam: Vital Signs: BP 86/51 mmHg  Pulse 111  Temp(Src) 97.5 F (36.4 C) (Oral)  Resp 26  Ht 5' (1.524 m)  Wt 67.8 kg (149 lb 7.6 oz)  BMI 29.19 kg/m2  SpO2 100% SpO2: SpO2: 100 % O2 Device: O2 Device: Nasal Cannula O2 Flow Rate: O2 Flow Rate (L/min): 2 L/min Intake/output summary:  Intake/Output Summary (Last 24 hours) at 07/03/15 0959 Last data filed at 07/03/15 0900  Gross per 24 hour  Intake  725.7 ml  Output    175 ml  Net  550.7 ml   LBM: Last BM Date: 07/02/15 Baseline Weight: Weight: 78.7 kg (173 lb 8 oz) Most recent weight: Weight: 67.8 kg (149 lb 7.6 oz)  Exam Findings:  General; Well nourished female. Very weak . Opens her eyes to voice. Speaking in a whisper Resp: No work of breathing observed. Poor inspiratory effort. Pt subjectively reporting shortness of breath Cardiac: Tachy. Heart sound distant         Palliative Performance Scale: 10%              Additional Data Reviewed: Recent Labs     07/01/15  0400   07/02/15  0500  07/03/15  0429  WBC  9.0   --   9.6   --   HGB  10.3*   --   10.3*   --   PLT  322   --   373   --   NA   --    < >  127*  128*  BUN   --    < >  101*  109*  CREATININE   --    < >  3.91*  4.64*   < > = values in this interval not displayed.     Time In: 0830 Time Out: 1030 Time Total: 120 min Greater than 50%  of this time was spent counseling and coordinating care related to the above assessment and plan. Staffed with Janett Billow, RN, and Dr. Aundra Dubin  Signed by: Dory Horn, NP  Dory Horn, NP  07/03/2015, 9:59 AM  Please contact Palliative Medicine Team phone at [phone] for questions  and concerns.

## 2015-07-03 NOTE — Progress Notes (Addendum)
Patient ID: Andrea [name], female   DOB: 1951/05/28, 64 y.o.   MRN: [id number] Advanced Heart Failure Rounding Note   Subjective:    Admitted from HF clinic with increased dyspnea and volume overload depsite home milrinone.   On 6/10 underwent swan and IABP placement for persistent cardiogenic shock with co-ox in 40s and worsening renal failure. Treated with CVVHD 6/11-6/18. Kidneys recovered partially but creatinine never got better than 3. IABP out 6/24   Remains on dual pressors. Norepi up to 28 mcg and on dobutamine 5. Oliguric. Cr worse again 3.6->3.9->4.64, BUN 109. Awakens but falls back to sleep immediately.  She had a bad night, restless and having chest pain.  SBP 80s.  She is no longer eating.   Echo LVEF 10% RV moderately HK. Severe MR/TR   Objective:   Weight Range:  Vital Signs:   Temp:  [96.1 F (35.6 C)-98.2 F (36.8 C)] 98.2 F (36.8 C) (07/03 0400) Pulse Rate:  [111-116] 111 (07/03 0000) Resp:  [13-44] 13 (07/03 0600) BP: (76-140)/(47-114) 88/61 mmHg (07/03 0600) SpO2:  [90 %-100 %] 100 % (07/03 0600) Weight:  [67.8 kg (149 lb 7.6 oz)] 67.8 kg (149 lb 7.6 oz) (07/03 0500) Last BM Date: 07/02/15  Weight change: Filed Weights   07/01/15 0300 07/02/15 0500 07/03/15 0500  Weight: 68.9 kg (151 lb 14.4 oz) 68.6 kg (151 lb 3.8 oz) 67.8 kg (149 lb 7.6 oz)    Intake/Output:   Intake/Output Summary (Last 24 hours) at 07/03/15 0727 Last data filed at 07/03/15 0600  Gross per 24 hour  Intake  687.1 ml  Output    140 ml  Net  547.1 ml     PHYSICAL EXAM: General: Lying in bed. Lethargic.Awkens but falls right back to sleep  HEENT: normal Neck: supple. JVP to jaw.  Carotids 2+ bilaterally; no bruits. No lymphadenopathy or thryomegaly appreciated. Cor: PMI normal. Regular No rubs, 3/6 MR/TR, +S3 Lungs: clear Abdomen: soft, tender, nondistended. No hepatosplenomegaly. No bruits or masses. Good bowel sounds. Extremities: no cyanosis, clubbing, rash, no  edema. Neuro: lethargic but responds.  cranial nerves grossly intact. Moves all 4 extremities w/o difficulty.          Telemetry: Sinus Tach 110s   Labs: Basic Metabolic Panel:  Recent Labs Lab 06/29/15 0430 06/30/15 0440 07/01/15 0405 07/02/15 0500 07/03/15 0429  NA 130* 130* 129* 127* 128*  K 3.9 4.1 4.6 4.6 5.1  CL 90* 92* 89* 88* 90*  CO2 24 25 22 22 23   GLUCOSE 104* 114* 149* 231* 158*  BUN 90* 96* 102* 101* 109*  CREATININE 3.71* 3.68* 3.67* 3.91* 4.64*  CALCIUM 9.9 10.0 10.0 9.9 10.0  PHOS 5.6* 4.8* 5.1* 5.7* 7.5*    Liver Function Tests:  Recent Labs Lab 06/29/15 0430 06/30/15 0440 07/01/15 0405 07/02/15 0500 07/03/15 0429  ALBUMIN 3.0* 2.9* 3.0* 2.8* 2.8*   No results for input(s): LIPASE, AMYLASE in the last 168 hours. No results for input(s): AMMONIA in the last 168 hours.  CBC:  Recent Labs Lab 06/28/15 0400 06/29/15 0430 06/30/15 0440 07/01/15 0400 07/02/15 0500  WBC 5.4 5.9 7.6 9.0 9.6  HGB 10.1* 9.7* 9.7* 10.3* 10.3*  HCT 29.5* 29.3* 28.6* 30.1* 30.7*  MCV 64.1* 65.3* 65.4* 65.2* 65.6*  PLT 243 271 275 322 373    Cardiac Enzymes: No results for input(s): CKTOTAL, CKMB, CKMBINDEX, TROPONINI in the last 168 hours.  BNP: BNP (last 3 results)  Recent Labs  03/15/15 2150 03/22/15 1601 06/04/2015  1800  BNP 701.4* 930.9* 1217.7*    ProBNP (last 3 results)  Recent Labs  11/08/14 1800  PROBNP 2681.0*      Other results:  Imaging: No results found.   Medications:     Scheduled Medications: . antiseptic oral rinse  7 mL Mouth Rinse BID  . enoxaparin (LOVENOX) injection  30 mg Subcutaneous Q24H  . feeding supplement (ENSURE ENLIVE)  237 mL Oral BID  . insulin aspart  0-9 Units Subcutaneous 6 times per day  .  morphine injection  2 mg Intravenous Once  . rOPINIRole  2 mg Oral QHS  . sodium chloride  10-40 mL Intracatheter Q12H  . sodium chloride  3 mL Intravenous Q12H    Infusions: . sodium chloride 250 mL  (06/29/15 0604)  . DOBUTamine 5 mcg/kg/min (07/02/15 1956)  . norepinephrine (LEVOPHED) Adult infusion 30 mcg/min (07/02/15 1300)    PRN Medications: sodium chloride, acetaminophen, nitroGLYCERIN, ondansetron (ZOFRAN) IV, ondansetron, sodium chloride, sodium chloride, sodium chloride   Assessment/Plan    1. 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 CAD S/P PCI with DES to prox RCA and stent to distal RCA in 2012. No s/s of ischemia.  3. PSVT - quiescent. No longer on amio 4. Acute on chronic renal failure, stage IV 5. Hyperkalemia 6. Acute blood loss anemia  7. UTI 8. Hyponatremia  Continues to decline despite dual pressor support. BP more tenuous and more uremic.  Difficult night with chest pain. Hypotensive despite dual pressors.  She is now oliguric. There are no viable options left for her unfortunately. Dr Andrea [name] has been in conversation with her partner Andrea [name], and she realized how sick Andrea [name] is. She has continued to worsen and has been uncomfortable.  I am going to add prn morphine and will recommend transition to comfort care today.  Will involve palliative care service. We have stopped all oral meds and CVP monitoring.   Andrea Mccamy,MD 7:27 AM  07/03/2015

## 2015-07-03 NOTE — Progress Notes (Signed)
Pt restless, moaning and complaining that "chest hurts". BP 96/63 MAP 70 HR 110 O2 94% on RA. RN notified cardiologist on-call and received order for one time dose of morphine 2 mg. Will continue to monitor pt.

## 2015-07-03 NOTE — Progress Notes (Signed)
Noted plans to consult Palliative team for transition to comfort care. CSW will await further direction and needs and assist as needed.   Reece Levy, MSW, [address] [facility] [phone]

## 2015-07-06 NOTE — Discharge Summary (Signed)
Advanced Heart Failure Team  Discharge Summary   Patient ID: Andrea [name] MRN: [id number], DOB/AGE: May 19, 1951 64 y.o. Admit date: 2015/07/01 D/C date:     07/05/2015    Primary Discharge Diagnoses:  1. 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 CAD S/P PCI with DES to prox RCA and stent to distal RCA in 2012. No s/s of ischemia.  3. PSVT: quiescent.  4. Acute on chronic renal failure, stage IV Creatinine 2.66 >4.64  5. Hyperkalemia 6. Acute blood loss anemia  7. UTI- Received 7 day course of rocephin 8. Hyponatremia- 135>128   Hospital Course:  Andrea [name] is a 64 y.o. female with a history of CAD DES to prox RCA and stent to dist RCA in 2012, chronic systolic heart failure, ICM s/p ICD placement, liver failure thougth to be from cardiomyopathy -seen at [facility], CVA intolerant aspirin , CKD, GERD, and DM. She was on chronic home milrinone 0.375 mcg since 02/2014.   Admitted from HF clinic on 6/7 with volume overload. Initially diuresed with IV lasix but due to poor response she had RHC on June 9th that showed cardiogenic shock on milrinone 0.375 mcg. Milrinone was stopped and dobutamine was started however creatinine continued to trend up from 3.0 to 3.6. Due to progressive cardiogenic shock with CO-OX in the 40s and hypotension, ABP and swan placed on 6/10. Due to hypotension levophed added. Cardiac surgery consulted 6/10 for possible LVAD but this was contingent on improved renal function. ECHO on 6/10 showed LVEF 10% and moderate HK in RV. She was on lasix drip + dobutamine + levophed with limited response and CVP remained greater than 25 as well as worsening renal function, nephrology was consulted. After nephrology evaluation she was started on CVVHD on 6/11 to attempt to improve renal function. Hemodynamically she improved somewhat on maximum support with dual inotropes, IABP, and CVVHD. She developed anuria but with CVVHD urine output improved.  Once renal function improved,CVVHD was stopped on 6/18. Renal function again declined. She remained on dual inotropes + IABP and was restarted on high dose diuretics again. On 6/23 IABP was accidentally pulled back with blood loss reported from groin. Heparin stopped and on  6/24 IABP was removed. She continued on dual inotropes but again declined. She had increasing uremia and worsening renal function. At that point there was no chance for mechanical support. Lengthy discussions occurred with her partner and family but they continued to request full code. At one point levophed was increased to 23 mcg with dobutamine at 7 mcg with minimal urine output. Due to progressive decline and altered mental status on July 2nd,  family agreed to palliative care. On July 3rd she was transitioned to DNR and comfort care. Every effort was made to improve her clinical condition. On July 4th she passed quietly with family at bedside.   Consultations 06/10/2015 Cardiac Surgery consulted for LVAD consideration.  06/11/2015  Nephrology for A/C CKD -->CVVHD 07/03/2015 Palliative Care for goals of care   RHC 06/09/15 RA = 25 RV = 56/25 PA = 62/24 (43) PCW = 25 Fick cardiac output/index = 2.6/1.5 Thermo CO/CI = 3.8/2.2 PVR = 6.9 WU FA sat = 95% PA sat = 40%, 45% SVC sat via PICC = 49%   Duration of Discharge Encounter: Greater than 35 minutes   Signed, Andrea Rollo NP-C  07/06/2015, 10:51 AM

## 2015-07-07 ENCOUNTER — Telehealth: Payer: Self-pay | Admitting: Cardiology

## 2015-07-07 NOTE — Telephone Encounter (Signed)
Original d/c received from [facility] Service will see if Dr.McLean will sign today.

## 2015-07-08 ENCOUNTER — Telehealth: Payer: Self-pay | Admitting: Cardiology

## 2015-07-08 NOTE — Telephone Encounter (Signed)
Dr.McLean signed d/c called for pick up, [facility] here for pick up.

## 2015-07-11 ENCOUNTER — Encounter: Payer: Self-pay | Admitting: *Deleted

## 2015-08-01 NOTE — Progress Notes (Signed)
Patient ID: Andrea [name], female   DOB: 1951-02-07, 64 y.o.   MRN: [id number] Advanced Heart Failure Rounding Note   Subjective:    Admitted from HF clinic with increased dyspnea and volume overload depsite home milrinone.   On 6/10 underwent swan and IABP placement for persistent cardiogenic shock with co-ox in 40s and worsening renal failure. Treated with CVVHD 6/11-6/18. Kidneys recovered partially but creatinine never got better than 3. IABP out 6/24   Renal function steadily worsened despite dobutamine/norepinephrine.  Unable to wean pressors.  Palliative care saw patient on 7/3 and in discussion with partner transitioned her to comfort care.  She is now off all cardiac meds and getting Dilaudid gtt.  She is not arousable.  She appears comfortable.  Agonal breathing.   Echo LVEF 10% RV moderately HK. Severe MR/TR   Objective:   Weight Range:  Vital Signs:   Temp:  [97.5 F (36.4 C)] 97.5 F (36.4 C) (07/03 0800) Pulse Rate:  [61-79] 63 (07/04 0600) Resp:  [5-26] 18 (07/04 0600) BP: (65-86)/(46-54) 73/48 mmHg (07/03 2040) SpO2:  [54 %-100 %] 85 % (07/04 0600) Weight:  [149 lb 7.6 oz (67.8 kg)] 149 lb 7.6 oz (67.8 kg) (07/03 1000) Last BM Date: 07/02/15  Weight change: Filed Weights   07/02/15 0500 07/03/15 0500 07/03/15 1000  Weight: 151 lb 3.8 oz (68.6 kg) 149 lb 7.6 oz (67.8 kg) 149 lb 7.6 oz (67.8 kg)    Intake/Output:   Intake/Output Summary (Last 24 hours) at 07/26/2015 0747 Last data filed at 07/26/2015 0600  Gross per 24 hour  Intake 130.32 ml  Output     45 ml  Net  85.32 ml     PHYSICAL EXAM: General: Lying in bed. Not arousable, agonal breathing.   HEENT: normal Neck: supple. JVP to jaw.  Carotids 2+ bilaterally; no bruits. No lymphadenopathy or thryomegaly appreciated. Cor: PMI normal. Regular No rubs, 3/6 MR/TR, +S3 Lungs: clear Abdomen: soft, nondistended. No hepatosplenomegaly. No bruits or masses. Good bowel sounds. Extremities: no cyanosis,  clubbing, rash, no edema. Neuro: Not arousable, agonal breathing.          Telemetry: Off telemetry  Labs: Basic Metabolic Panel:  Recent Labs Lab 06/29/15 0430 06/30/15 0440 07/01/15 0405 07/02/15 0500 07/03/15 0429  NA 130* 130* 129* 127* 128*  K 3.9 4.1 4.6 4.6 5.1  CL 90* 92* 89* 88* 90*  CO2 24 25 22 22 23   GLUCOSE 104* 114* 149* 231* 158*  BUN 90* 96* 102* 101* 109*  CREATININE 3.71* 3.68* 3.67* 3.91* 4.64*  CALCIUM 9.9 10.0 10.0 9.9 10.0  PHOS 5.6* 4.8* 5.1* 5.7* 7.5*    Liver Function Tests:  Recent Labs Lab 06/29/15 0430 06/30/15 0440 07/01/15 0405 07/02/15 0500 07/03/15 0429  ALBUMIN 3.0* 2.9* 3.0* 2.8* 2.8*   No results for input(s): LIPASE, AMYLASE in the last 168 hours. No results for input(s): AMMONIA in the last 168 hours.  CBC:  Recent Labs Lab 06/28/15 0400 06/29/15 0430 06/30/15 0440 07/01/15 0400 07/02/15 0500  WBC 5.4 5.9 7.6 9.0 9.6  HGB 10.1* 9.7* 9.7* 10.3* 10.3*  HCT 29.5* 29.3* 28.6* 30.1* 30.7*  MCV 64.1* 65.3* 65.4* 65.2* 65.6*  PLT 243 271 275 322 373    Cardiac Enzymes: No results for input(s): CKTOTAL, CKMB, CKMBINDEX, TROPONINI in the last 168 hours.  BNP: BNP (last 3 results)  Recent Labs  03/15/15 2150 03/22/15 1601 06/19/15 1800  BNP 701.4* 930.9* 1217.7*    ProBNP (last 3 results)  Recent Labs  11/08/14 1800  PROBNP 2681.0*      Other results:  Imaging: No results found.   Medications:     Scheduled Medications: . antiseptic oral rinse  7 mL Mouth Rinse BID  . sodium chloride  10-40 mL Intracatheter Q12H    Infusions: . HYDROmorphone 0.5 mg/hr (07/03/15 2051)    PRN Medications: glycopyrrolate, HYDROmorphone, LORazepam, nitroGLYCERIN, ondansetron (ZOFRAN) IV, sodium chloride, sodium chloride   Assessment/Plan    1. 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 CAD S/P PCI with DES to prox RCA and stent to distal RCA in 2012. No s/s of ischemia.   3. PSVT: quiescent. No longer on amio 4. Acute on chronic renal failure, stage IV 5. Hyperkalemia 6. Acute blood loss anemia  7. UTI 8. Hyponatremia  Patient continued to decline despite dual pressor support.  BP tenuous with progressive uremia.  After consultation with palliative care service yesterday, she was transitioned to comfort measures.  She is off all cardiac meds and on Dilaudid gtt.  Appears comfortable.  Not arousable with agonal breathing.  Suspect she will expire at some point during the day today.     Fransico Meadow 7:47 AM  07/31/15

## 2015-08-01 NOTE — Progress Notes (Signed)
40 mls of diluadid (0.5mg /ml) wasted in sink. Witnessed by Isabella Bowens, RN and Noah Charon, RN.

## 2015-08-01 NOTE — Progress Notes (Addendum)
Mrs. Ulch expired at 1150 am. No active heart sounds, no active lung sounds. Verified by Isabella Bowens, RN, and Noah Charon, RN. Family at bedside, emotional support given.

## 2015-08-01 DEATH — deceased

## 2015-11-11 IMAGING — CT CT ABD-PELV W/ CM
2 of 5 series · 16 of 46 positions shown, 18 images · IV contrast (omnipaque)
Comparison: CT of the abdomen and pelvis from 11/08/2014

CLINICAL DATA: Acute onset of generalized abdominal pain and
tenderness. Vomiting. Initial encounter.

EXAM:
CT ABDOMEN AND PELVIS WITH CONTRAST
TECHNIQUE: Multidetector CT imaging of the abdomen and pelvis was performed
using the standard protocol following bolus administration of
intravenous contrast.
CONTRAST:  100 mL of Omnipaque 300 IV contrast

[Series 2: abd/ pelvis 5.0 i30f 1 · axial · 0.75mm/px · z∈[-464,-109]mm · 13 of 81 slices shown, 15 images]
[im 5/81  soft-tissue]
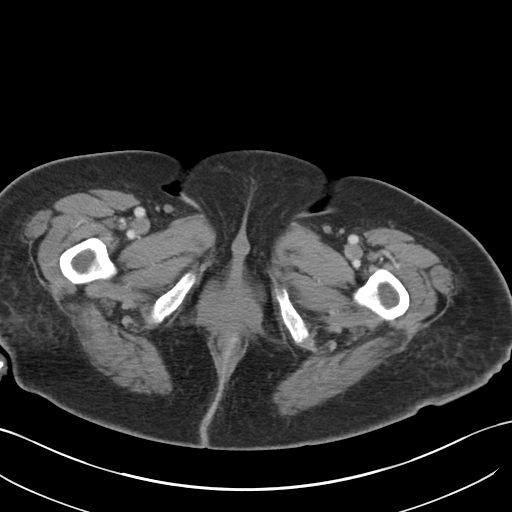
[im 5/81  bone]
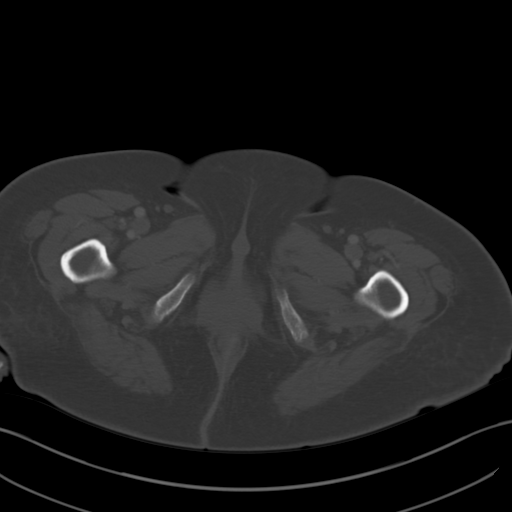
[im 9/81  soft-tissue]
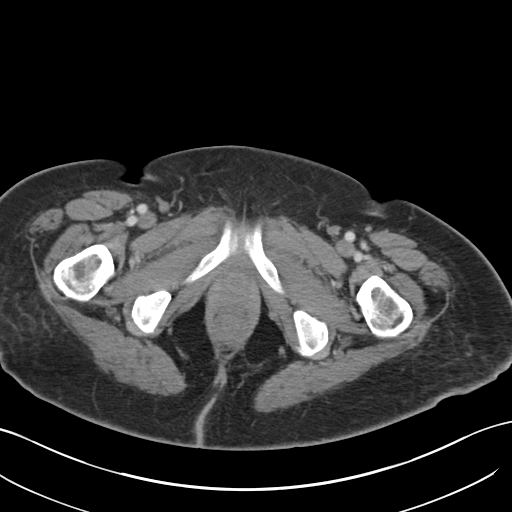
[im 18/81  soft-tissue]
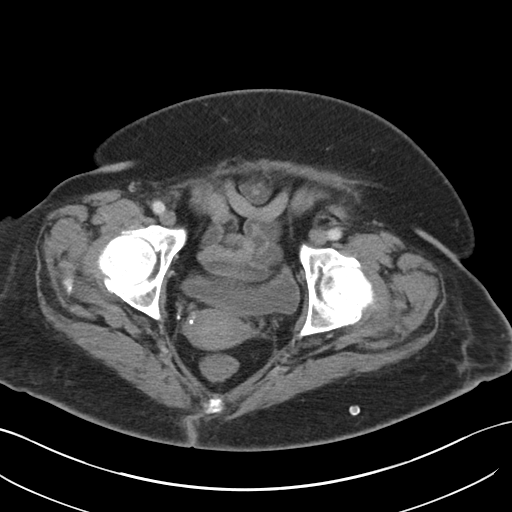
[im 23/81  soft-tissue]
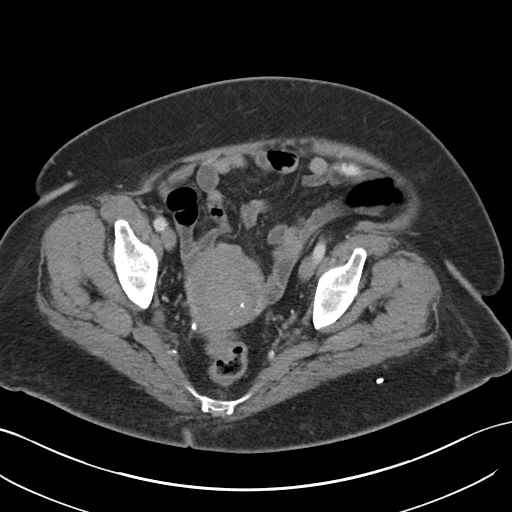
[im 27/81  soft-tissue]
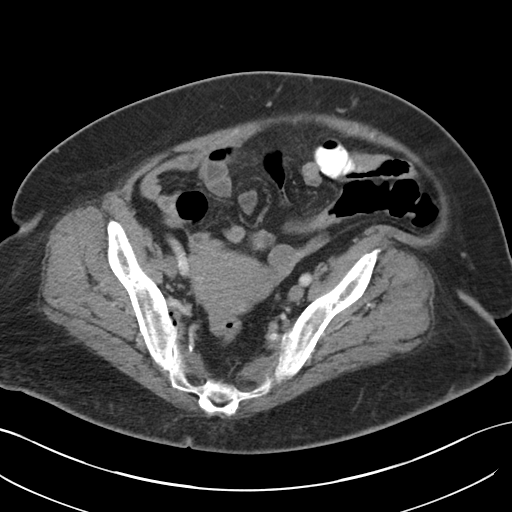
[im 36/81  soft-tissue]
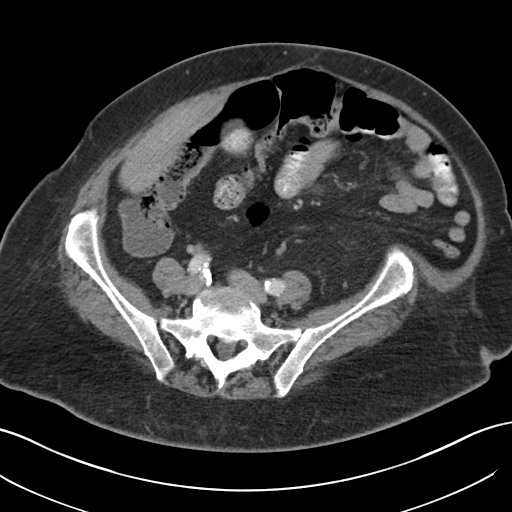
[im 41/81  soft-tissue]
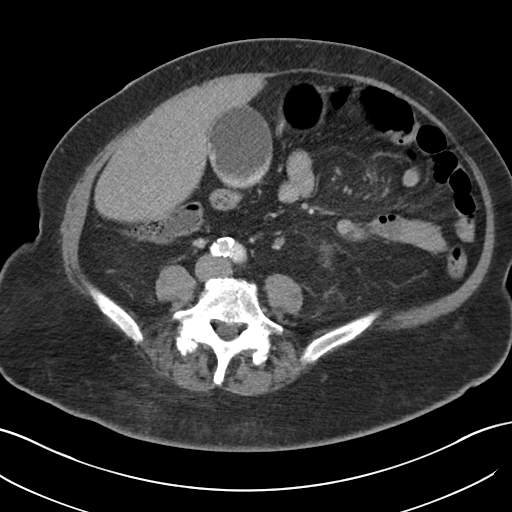
[im 45/81  soft-tissue]
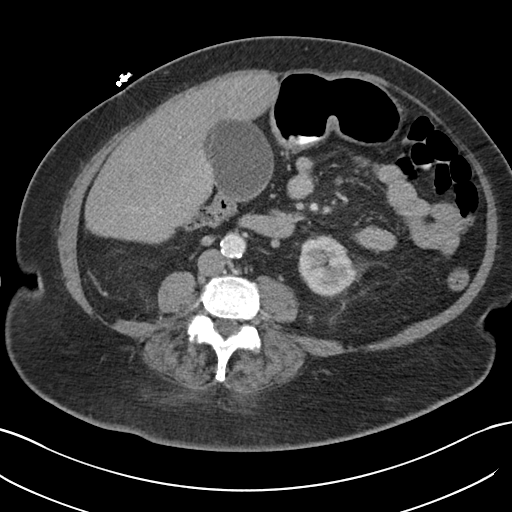
[im 54/81  soft-tissue]
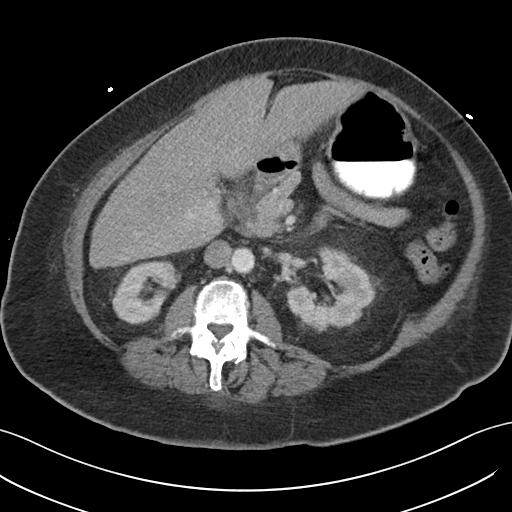
[im 54/81  bone]
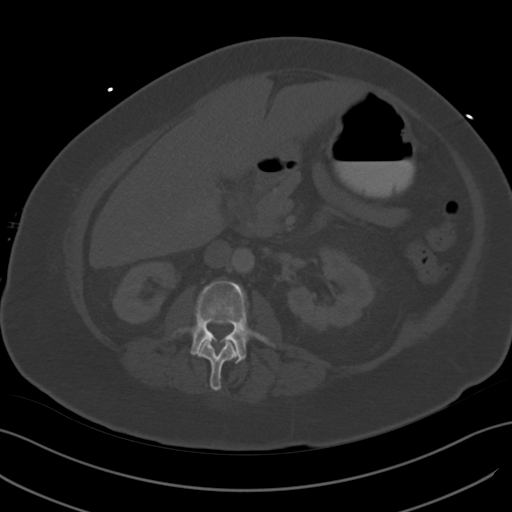
[im 58/81  soft-tissue]
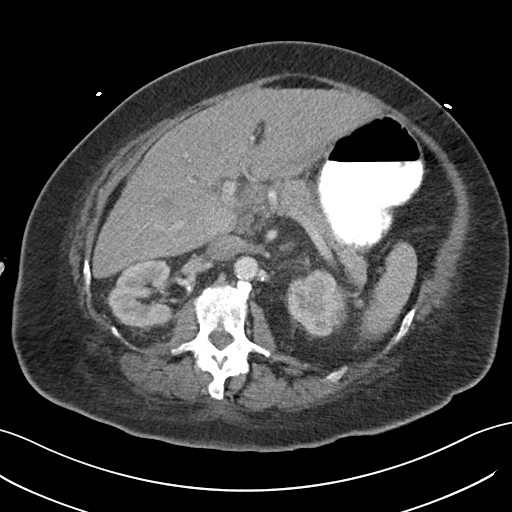
[im 63/81  soft-tissue]
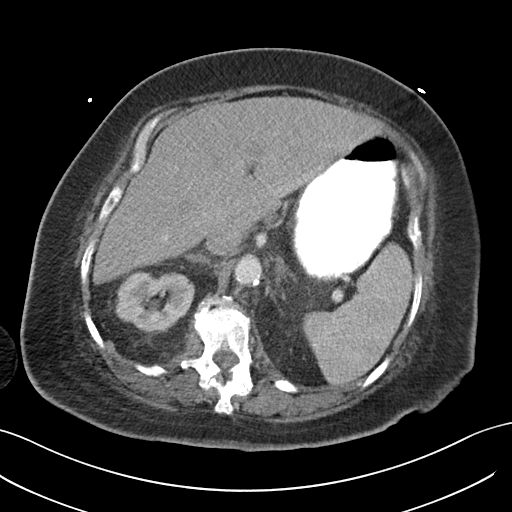
[im 72/81  soft-tissue]
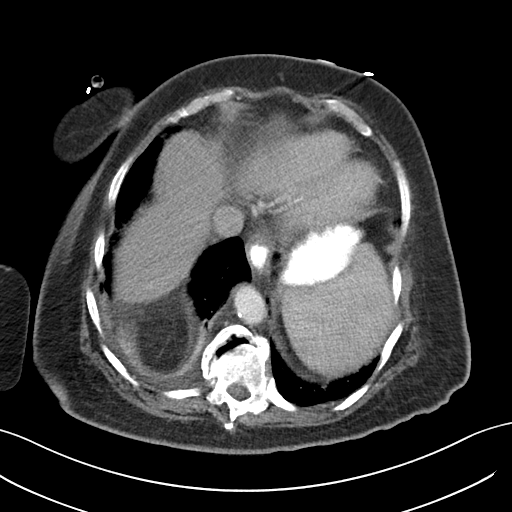
[im 76/81  soft-tissue]
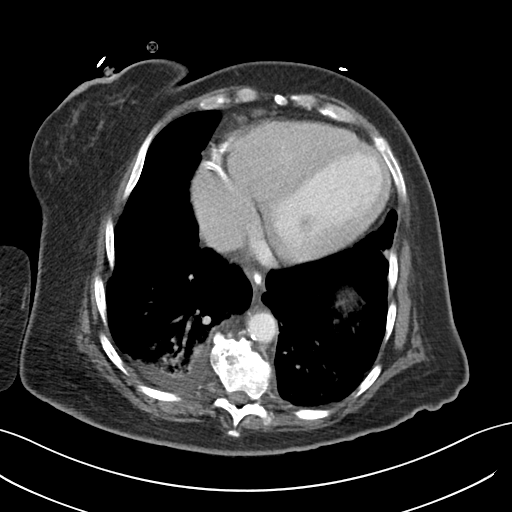

[Series 5: coronals · coronal · 0.87mm/px · 3 of 154 slices shown]
[im 52/154  soft-tissue]
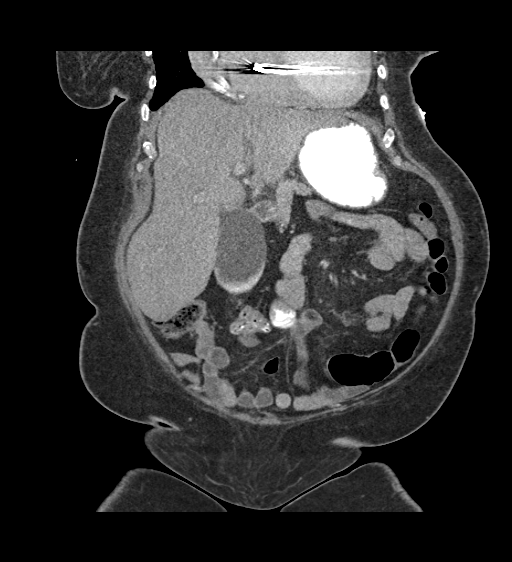
[im 69/154  soft-tissue]
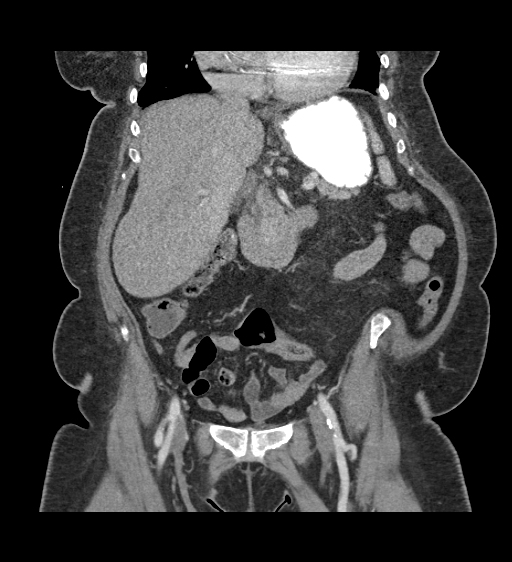
[im 86/154  soft-tissue]
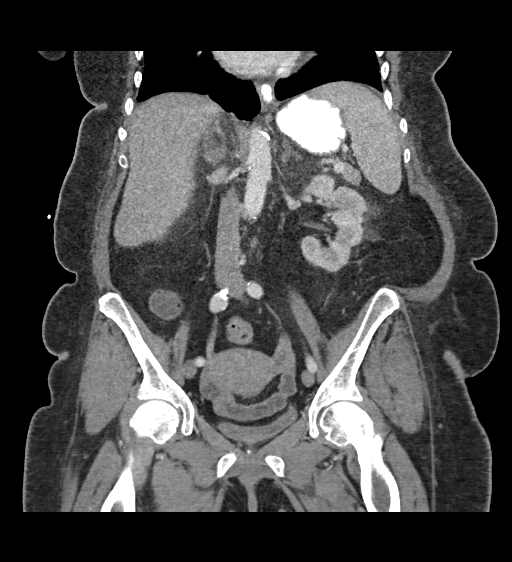

[16 of 46 positions shown; findings below may reference images not displayed]

FINDINGS: A trace right pleural effusion is noted. Right basilar airspace
opacity likely reflects atelectasis. Scattered coronary artery
calcifications are seen. An AICD lead is partially imaged.

The liver and spleen are unremarkable in appearance. Trace fluid is
suggested adjacent to the gallbladder. Stones are noted dependently
within the gallbladder. The gallbladder is otherwise unremarkable,
without significant gallbladder wall thickening. The pancreas and
adrenal glands are unremarkable.

Nonspecific perinephric stranding is noted bilaterally. Bilateral
fetal lobulations are noted. No renal or ureteral stones are seen.
There is no evidence of hydronephrosis.

No free fluid is identified. The small bowel is unremarkable in
appearance. The stomach is within normal limits. No acute vascular
abnormalities are seen.

The appendix is normal in caliber, without evidence for
appendicitis. The colon is unremarkable in appearance.

The bladder is mildly distended and grossly unremarkable. Multiple
fibroids are seen within the uterus, with mild associated
calcification. The ovaries are relatively symmetric. No suspicious
adnexal masses are seen. No inguinal lymphadenopathy is seen.

No acute osseous abnormalities are identified. There is chronic mild
loss of height at vertebral bodies T11, T12 and L1, with mild
associated degenerative change and vacuum phenomenon. Underlying
facet disease is noted along the lower thoracic and lumbar spine.
IMPRESSION: 1. Trace fluid suggested adjacent to the gallbladder, new from the
prior study. Stones dependently within the gallbladder are stable in
appearance. Gallbladder otherwise unremarkable.
2. Trace right pleural effusion, with right basilar airspace opacity
likely reflecting atelectasis.
3. Scattered coronary artery calcifications seen.
4. Uterine fibroids noted.
5. Chronic mild loss of height at T11, T12 and L1, with mild
associated degenerative change.
